# Patient Record
Sex: Male | Born: 1950
Health system: Southern US, Community
[De-identification: ages and names within clinical notes are randomized; demographics above are authoritative.]

## PROBLEM LIST (undated history)

## (undated) DIAGNOSIS — R011 Cardiac murmur, unspecified: Secondary | ICD-10-CM

## (undated) DIAGNOSIS — I2510 Atherosclerotic heart disease of native coronary artery without angina pectoris: Secondary | ICD-10-CM

## (undated) DIAGNOSIS — J439 Emphysema, unspecified: Secondary | ICD-10-CM

## (undated) DIAGNOSIS — R0902 Hypoxemia: Secondary | ICD-10-CM

## (undated) DIAGNOSIS — I509 Heart failure, unspecified: Secondary | ICD-10-CM

## (undated) DIAGNOSIS — J449 Chronic obstructive pulmonary disease, unspecified: Secondary | ICD-10-CM

## (undated) DIAGNOSIS — D120 Benign neoplasm of cecum: Secondary | ICD-10-CM

## (undated) DIAGNOSIS — N6310 Unspecified lump in the right breast, unspecified quadrant: Secondary | ICD-10-CM

## (undated) DIAGNOSIS — M545 Low back pain, unspecified: Secondary | ICD-10-CM

## (undated) DIAGNOSIS — T7840XA Allergy, unspecified, initial encounter: Secondary | ICD-10-CM

## (undated) DIAGNOSIS — Z86010 Personal history of colonic polyps: Secondary | ICD-10-CM

## (undated) DIAGNOSIS — R9439 Abnormal result of other cardiovascular function study: Secondary | ICD-10-CM

## (undated) DIAGNOSIS — L259 Unspecified contact dermatitis, unspecified cause: Secondary | ICD-10-CM

## (undated) DIAGNOSIS — I10 Essential (primary) hypertension: Secondary | ICD-10-CM

## (undated) DIAGNOSIS — E785 Hyperlipidemia, unspecified: Secondary | ICD-10-CM

## (undated) DIAGNOSIS — E119 Type 2 diabetes mellitus without complications: Secondary | ICD-10-CM

## (undated) HISTORY — DX: Essential (primary) hypertension: I10

## (undated) HISTORY — DX: Hyperlipidemia, unspecified: E78.5

## (undated) HISTORY — DX: Hypoxemia: R09.02

## (undated) HISTORY — DX: Allergy, unspecified, initial encounter: T78.40XA

## (undated) HISTORY — PX: WISDOM TOOTH EXTRACTION: SHX21

## (undated) HISTORY — DX: Low back pain: M54.5

## (undated) HISTORY — DX: Abnormal result of other cardiovascular function study: R94.39

## (undated) HISTORY — DX: Emphysema, unspecified: J43.9

## (undated) HISTORY — DX: Unspecified contact dermatitis, unspecified cause: L25.9

## (undated) HISTORY — DX: Low back pain, unspecified: M54.50

## (undated) HISTORY — DX: Benign neoplasm of cecum: D12.0

## (undated) HISTORY — DX: Personal history of colonic polyps: Z86.010

## (undated) HISTORY — DX: Unspecified lump in the right breast, unspecified quadrant: N63.10

## (undated) HISTORY — PX: COLONOSCOPY W/ POLYPECTOMY: SHX1380

## (undated) HISTORY — PX: CARDIAC VALVE REPLACEMENT: SHX585

## (undated) HISTORY — PX: TONSILLECTOMY: SUR1361

---

## 2004-08-26 DIAGNOSIS — R9439 Abnormal result of other cardiovascular function study: Secondary | ICD-10-CM

## 2004-08-26 HISTORY — DX: Abnormal result of other cardiovascular function study: R94.39

## 2005-04-25 ENCOUNTER — Inpatient Hospital Stay (HOSPITAL_COMMUNITY): Admission: EM | Admit: 2005-04-25 | Discharge: 2005-04-28 | Payer: Self-pay | Admitting: Emergency Medicine

## 2005-04-25 ENCOUNTER — Ambulatory Visit: Payer: Self-pay | Admitting: Internal Medicine

## 2005-04-26 ENCOUNTER — Ambulatory Visit: Payer: Self-pay | Admitting: Internal Medicine

## 2005-05-01 ENCOUNTER — Ambulatory Visit: Payer: Self-pay | Admitting: Internal Medicine

## 2005-05-15 ENCOUNTER — Ambulatory Visit: Payer: Self-pay | Admitting: Internal Medicine

## 2005-05-17 ENCOUNTER — Ambulatory Visit: Payer: Self-pay | Admitting: Internal Medicine

## 2005-05-21 ENCOUNTER — Ambulatory Visit: Payer: Self-pay

## 2005-05-21 ENCOUNTER — Encounter: Payer: Self-pay | Admitting: Internal Medicine

## 2005-05-21 ENCOUNTER — Encounter: Admission: RE | Admit: 2005-05-21 | Discharge: 2005-08-19 | Payer: Self-pay | Admitting: Internal Medicine

## 2005-06-04 ENCOUNTER — Ambulatory Visit: Payer: Self-pay | Admitting: Internal Medicine

## 2005-07-12 ENCOUNTER — Ambulatory Visit: Payer: Self-pay | Admitting: Internal Medicine

## 2005-07-15 ENCOUNTER — Ambulatory Visit: Payer: Self-pay | Admitting: Internal Medicine

## 2005-07-22 ENCOUNTER — Ambulatory Visit: Payer: Self-pay | Admitting: Internal Medicine

## 2005-11-05 ENCOUNTER — Ambulatory Visit: Payer: Self-pay | Admitting: Internal Medicine

## 2005-11-05 ENCOUNTER — Ambulatory Visit (HOSPITAL_COMMUNITY): Admission: RE | Admit: 2005-11-05 | Discharge: 2005-11-05 | Payer: Self-pay | Admitting: Internal Medicine

## 2005-11-13 ENCOUNTER — Ambulatory Visit: Payer: Self-pay | Admitting: Gastroenterology

## 2005-12-06 ENCOUNTER — Encounter (INDEPENDENT_AMBULATORY_CARE_PROVIDER_SITE_OTHER): Payer: Self-pay | Admitting: Specialist

## 2005-12-06 ENCOUNTER — Encounter: Payer: Self-pay | Admitting: Internal Medicine

## 2005-12-06 ENCOUNTER — Ambulatory Visit: Payer: Self-pay | Admitting: Gastroenterology

## 2005-12-16 ENCOUNTER — Ambulatory Visit: Payer: Self-pay | Admitting: Internal Medicine

## 2006-03-17 ENCOUNTER — Ambulatory Visit: Payer: Self-pay | Admitting: Internal Medicine

## 2006-03-21 ENCOUNTER — Ambulatory Visit: Payer: Self-pay | Admitting: Internal Medicine

## 2006-04-17 ENCOUNTER — Ambulatory Visit: Payer: Self-pay | Admitting: Internal Medicine

## 2006-05-09 ENCOUNTER — Ambulatory Visit: Payer: Self-pay | Admitting: Internal Medicine

## 2006-08-29 ENCOUNTER — Ambulatory Visit: Payer: Self-pay | Admitting: Internal Medicine

## 2006-10-06 ENCOUNTER — Ambulatory Visit: Payer: Self-pay | Admitting: Internal Medicine

## 2006-10-20 ENCOUNTER — Ambulatory Visit: Payer: Self-pay | Admitting: Internal Medicine

## 2007-01-13 ENCOUNTER — Ambulatory Visit: Payer: Self-pay | Admitting: Internal Medicine

## 2007-03-18 DIAGNOSIS — M1990 Unspecified osteoarthritis, unspecified site: Secondary | ICD-10-CM

## 2007-03-18 DIAGNOSIS — F17200 Nicotine dependence, unspecified, uncomplicated: Secondary | ICD-10-CM

## 2007-03-18 DIAGNOSIS — K648 Other hemorrhoids: Secondary | ICD-10-CM

## 2007-03-18 DIAGNOSIS — Z860100 Personal history of colon polyps, unspecified: Secondary | ICD-10-CM

## 2007-03-18 HISTORY — DX: Personal history of colon polyps, unspecified: Z86.0100

## 2007-03-18 HISTORY — DX: Unspecified osteoarthritis, unspecified site: M19.90

## 2007-03-18 HISTORY — DX: Other hemorrhoids: K64.8

## 2007-03-18 HISTORY — DX: Personal history of colonic polyps: Z86.010

## 2007-10-07 ENCOUNTER — Ambulatory Visit: Payer: Self-pay | Admitting: Internal Medicine

## 2007-10-07 LAB — CONVERTED CEMR LAB
ALT: 31 units/L (ref 0–53)
Albumin: 3.7 g/dL (ref 3.5–5.2)
BUN: 9 mg/dL (ref 6–23)
Basophils Absolute: 0 10*3/uL (ref 0.0–0.1)
Calcium: 9.1 mg/dL (ref 8.4–10.5)
Cholesterol: 243 mg/dL (ref 0–200)
Creatinine, Ser: 0.9 mg/dL (ref 0.4–1.5)
Direct LDL: 177.6 mg/dL
Eosinophils Absolute: 0.1 10*3/uL (ref 0.0–0.6)
Hemoglobin, Urine: NEGATIVE
Leukocytes, UA: NEGATIVE
Lymphocytes Relative: 43.6 % (ref 12.0–46.0)
MCV: 75.6 fL — ABNORMAL LOW (ref 78.0–100.0)
Monocytes Absolute: 0.5 10*3/uL (ref 0.2–0.7)
Monocytes Relative: 10 % (ref 3.0–11.0)
Nitrite: NEGATIVE
PSA: 0.24 ng/mL (ref 0.10–4.00)
Potassium: 4.2 meq/L (ref 3.5–5.1)
RBC: 5.86 M/uL — ABNORMAL HIGH (ref 4.22–5.81)
RDW: 13.6 % (ref 11.5–14.6)
Sodium: 140 meq/L (ref 135–145)
Specific Gravity, Urine: >=1.03 (ref 1.000–1.03)
Total Bilirubin: 0.8 mg/dL (ref 0.3–1.2)
Urine Glucose: NEGATIVE mg/dL
Urobilinogen, UA: 0.2 (ref 0.0–1.0)

## 2007-10-21 ENCOUNTER — Ambulatory Visit: Payer: Self-pay | Admitting: Internal Medicine

## 2007-10-21 DIAGNOSIS — E785 Hyperlipidemia, unspecified: Secondary | ICD-10-CM

## 2007-10-21 DIAGNOSIS — I10 Essential (primary) hypertension: Secondary | ICD-10-CM | POA: Insufficient documentation

## 2007-10-21 HISTORY — DX: Essential (primary) hypertension: I10

## 2007-10-21 HISTORY — DX: Hyperlipidemia, unspecified: E78.5

## 2008-01-25 ENCOUNTER — Encounter: Payer: Self-pay | Admitting: Internal Medicine

## 2008-03-26 DIAGNOSIS — N6310 Unspecified lump in the right breast, unspecified quadrant: Secondary | ICD-10-CM

## 2008-03-26 HISTORY — DX: Unspecified lump in the right breast, unspecified quadrant: N63.10

## 2008-04-20 ENCOUNTER — Ambulatory Visit: Payer: Self-pay | Admitting: Internal Medicine

## 2008-04-20 DIAGNOSIS — D179 Benign lipomatous neoplasm, unspecified: Secondary | ICD-10-CM | POA: Insufficient documentation

## 2008-04-20 DIAGNOSIS — N63 Unspecified lump in unspecified breast: Secondary | ICD-10-CM

## 2008-04-26 ENCOUNTER — Encounter: Admission: RE | Admit: 2008-04-26 | Discharge: 2008-04-26 | Payer: Self-pay | Admitting: Internal Medicine

## 2008-05-09 ENCOUNTER — Ambulatory Visit: Payer: Self-pay | Admitting: Internal Medicine

## 2008-05-09 LAB — CONVERTED CEMR LAB
ALT: 32 units/L (ref 0–53)
AST: 22 units/L (ref 0–37)
BUN: 12 mg/dL (ref 6–23)
Calcium: 9.1 mg/dL (ref 8.4–10.5)
Cholesterol: 173 mg/dL (ref 0–200)
GFR calc non Af Amer: 106 mL/min
LDL Cholesterol: 123 mg/dL — ABNORMAL HIGH (ref 0–99)
Potassium: 5 meq/L (ref 3.5–5.1)
Sodium: 143 meq/L (ref 135–145)
TSH: 0.82 microintl units/mL (ref 0.35–5.50)

## 2008-05-20 ENCOUNTER — Ambulatory Visit: Payer: Self-pay | Admitting: Internal Medicine

## 2008-05-20 DIAGNOSIS — E1165 Type 2 diabetes mellitus with hyperglycemia: Secondary | ICD-10-CM

## 2008-05-20 DIAGNOSIS — E1140 Type 2 diabetes mellitus with diabetic neuropathy, unspecified: Secondary | ICD-10-CM

## 2008-05-20 DIAGNOSIS — IMO0002 Reserved for concepts with insufficient information to code with codable children: Secondary | ICD-10-CM

## 2008-05-20 HISTORY — DX: Reserved for concepts with insufficient information to code with codable children: IMO0002

## 2008-05-20 HISTORY — DX: Type 2 diabetes mellitus with diabetic neuropathy, unspecified: E11.40

## 2008-06-17 ENCOUNTER — Ambulatory Visit: Payer: Self-pay | Admitting: Internal Medicine

## 2008-07-26 HISTORY — PX: LIPOMA EXCISION: SHX5283

## 2008-08-11 ENCOUNTER — Encounter: Payer: Self-pay | Admitting: Cardiovascular Disease

## 2008-08-11 ENCOUNTER — Ambulatory Visit: Payer: Self-pay | Admitting: Cardiovascular Disease

## 2008-08-11 DIAGNOSIS — R9439 Abnormal result of other cardiovascular function study: Secondary | ICD-10-CM

## 2008-08-11 HISTORY — DX: Abnormal result of other cardiovascular function study: R94.39

## 2008-08-18 ENCOUNTER — Encounter: Payer: Self-pay | Admitting: Cardiovascular Disease

## 2008-08-18 ENCOUNTER — Ambulatory Visit: Payer: Self-pay

## 2008-08-22 ENCOUNTER — Ambulatory Visit: Payer: Self-pay | Admitting: Cardiovascular Disease

## 2008-09-13 ENCOUNTER — Encounter: Payer: Self-pay | Admitting: Internal Medicine

## 2009-02-07 ENCOUNTER — Telehealth: Payer: Self-pay | Admitting: Internal Medicine

## 2009-02-10 ENCOUNTER — Ambulatory Visit: Payer: Self-pay | Admitting: Internal Medicine

## 2009-02-10 DIAGNOSIS — N529 Male erectile dysfunction, unspecified: Secondary | ICD-10-CM | POA: Insufficient documentation

## 2009-02-10 HISTORY — DX: Male erectile dysfunction, unspecified: N52.9

## 2009-03-03 ENCOUNTER — Ambulatory Visit: Payer: Self-pay | Admitting: Internal Medicine

## 2009-03-03 LAB — CONVERTED CEMR LAB
ALT: 30 units/L (ref 0–53)
Albumin: 3.9 g/dL (ref 3.5–5.2)
BUN: 11 mg/dL (ref 6–23)
Bilirubin, Direct: 0.1 mg/dL (ref 0.0–0.3)
Cholesterol: 137 mg/dL (ref 0–200)
Creatinine, Ser: 0.8 mg/dL (ref 0.4–1.5)
Glucose, Bld: 149 mg/dL — ABNORMAL HIGH (ref 70–99)
Microalb Creat Ratio: 3.8 mg/g (ref 0.0–30.0)
Microalb, Ur: 1.1 mg/dL (ref 0.0–1.9)
Sodium: 140 meq/L (ref 135–145)
TSH: 0.6 microintl units/mL (ref 0.35–5.50)
Testosterone: 198.22 ng/dL — ABNORMAL LOW (ref 350.00–890.00)

## 2009-03-14 ENCOUNTER — Telehealth: Payer: Self-pay | Admitting: Family Medicine

## 2009-03-23 ENCOUNTER — Ambulatory Visit: Payer: Self-pay | Admitting: Internal Medicine

## 2009-03-23 DIAGNOSIS — R109 Unspecified abdominal pain: Secondary | ICD-10-CM

## 2009-03-29 ENCOUNTER — Telehealth: Payer: Self-pay | Admitting: Internal Medicine

## 2009-03-29 ENCOUNTER — Ambulatory Visit: Payer: Self-pay | Admitting: Interventional Radiology

## 2009-03-29 ENCOUNTER — Ambulatory Visit (HOSPITAL_BASED_OUTPATIENT_CLINIC_OR_DEPARTMENT_OTHER): Admission: RE | Admit: 2009-03-29 | Discharge: 2009-03-29 | Payer: Self-pay | Admitting: Internal Medicine

## 2009-04-19 LAB — CONVERTED CEMR LAB
Calcium: 9.5 mg/dL (ref 8.4–10.5)
Creatinine, Ser: 0.89 mg/dL (ref 0.40–1.50)
WBC, UA: NONE SEEN cells/hpf (ref <3)

## 2009-05-15 ENCOUNTER — Ambulatory Visit: Payer: Self-pay | Admitting: Internal Medicine

## 2009-07-12 ENCOUNTER — Ambulatory Visit: Payer: Self-pay | Admitting: Internal Medicine

## 2009-07-12 LAB — CONVERTED CEMR LAB
CO2: 23 meq/L (ref 19–32)
Potassium: 4.7 meq/L (ref 3.5–5.3)
Sodium: 140 meq/L (ref 135–145)

## 2009-07-12 LAB — HM DIABETES FOOT EXAM

## 2009-07-13 ENCOUNTER — Encounter: Payer: Self-pay | Admitting: Internal Medicine

## 2009-07-28 ENCOUNTER — Telehealth: Payer: Self-pay | Admitting: Internal Medicine

## 2009-08-07 ENCOUNTER — Telehealth: Payer: Self-pay | Admitting: Internal Medicine

## 2009-10-12 ENCOUNTER — Ambulatory Visit: Payer: Self-pay | Admitting: Internal Medicine

## 2009-12-04 ENCOUNTER — Encounter: Payer: Self-pay | Admitting: Internal Medicine

## 2009-12-04 LAB — CONVERTED CEMR LAB
BUN: 10 mg/dL (ref 6–23)
Chloride: 104 meq/L (ref 96–112)
Glucose, Bld: 235 mg/dL — ABNORMAL HIGH (ref 70–99)
Hgb A1c MFr Bld: 10.4 % — ABNORMAL HIGH (ref 4.6–6.1)
Sodium: 139 meq/L (ref 135–145)

## 2009-12-11 ENCOUNTER — Ambulatory Visit: Payer: Self-pay | Admitting: Internal Medicine

## 2009-12-22 ENCOUNTER — Telehealth: Payer: Self-pay | Admitting: Internal Medicine

## 2009-12-28 ENCOUNTER — Telehealth: Payer: Self-pay | Admitting: Internal Medicine

## 2010-01-18 ENCOUNTER — Ambulatory Visit: Payer: Self-pay | Admitting: Internal Medicine

## 2010-01-31 ENCOUNTER — Encounter: Payer: Self-pay | Admitting: Internal Medicine

## 2010-03-01 ENCOUNTER — Telehealth: Payer: Self-pay | Admitting: Internal Medicine

## 2010-03-23 ENCOUNTER — Telehealth: Payer: Self-pay | Admitting: Internal Medicine

## 2010-03-29 ENCOUNTER — Encounter: Payer: Self-pay | Admitting: Internal Medicine

## 2010-05-02 ENCOUNTER — Ambulatory Visit: Payer: Self-pay | Admitting: Internal Medicine

## 2010-05-02 LAB — CONVERTED CEMR LAB
BUN: 13 mg/dL (ref 6–23)
Chloride: 103 meq/L (ref 96–112)
Glucose, Bld: 165 mg/dL — ABNORMAL HIGH (ref 70–99)
Hgb A1c MFr Bld: 8.5 % — ABNORMAL HIGH (ref <5.7)
Potassium: 4.8 meq/L (ref 3.5–5.3)

## 2010-08-02 ENCOUNTER — Ambulatory Visit: Payer: Self-pay | Admitting: Internal Medicine

## 2010-08-02 ENCOUNTER — Encounter: Payer: Self-pay | Admitting: Internal Medicine

## 2010-08-02 DIAGNOSIS — R002 Palpitations: Secondary | ICD-10-CM

## 2010-08-02 LAB — CONVERTED CEMR LAB
ALT: 20 U/L
AST: 17 U/L
Albumin: 4.5 g/dL
Alkaline Phosphatase: 56 U/L
BUN: 17 mg/dL
Bilirubin, Direct: 0.1 mg/dL
CO2: 28 meq/L
Calcium: 9.7 mg/dL
Chloride: 104 meq/L
Cholesterol: 215 mg/dL — ABNORMAL HIGH
Creatinine, Ser: 0.83 mg/dL
Creatinine, Urine: 155.3 mg/dL
Glucose, Bld: 107 mg/dL — ABNORMAL HIGH
HDL: 46 mg/dL
Hgb A1c MFr Bld: 8.9 % — ABNORMAL HIGH
Indirect Bilirubin: 0.2 mg/dL
LDL Cholesterol: 144 mg/dL — ABNORMAL HIGH
Microalb Creat Ratio: 4.2 mg/g
Microalb, Ur: 0.65 mg/dL
Potassium: 4.7 meq/L
Sodium: 140 meq/L
Total Bilirubin: 0.3 mg/dL
Total CHOL/HDL Ratio: 4.7
Total Protein: 7.2 g/dL
Triglycerides: 127 mg/dL
VLDL: 25 mg/dL

## 2010-08-03 ENCOUNTER — Encounter: Payer: Self-pay | Admitting: Internal Medicine

## 2010-08-08 ENCOUNTER — Telehealth: Payer: Self-pay | Admitting: Internal Medicine

## 2010-08-28 ENCOUNTER — Encounter: Payer: Self-pay | Admitting: Internal Medicine

## 2010-09-17 ENCOUNTER — Encounter: Payer: Self-pay | Admitting: Internal Medicine

## 2010-09-25 NOTE — Letter (Signed)
   [place] [facility] [address] [place], [place]  [address]  [place] Phone: [phone]      May 02, 2010   [name] [address] [place], [place] [address]  RE:  LAB RESULTS  Dear  [name],  The following is an interpretation of your most recent lab tests.  Please take note of any instructions provided or changes to medications that have resulted from your lab work.  ELECTROLYTES:  Good - no changes needed  KIDNEY FUNCTION TESTS:  Good - no changes needed    DIABETIC STUDIES:  Improved - continue management Blood Glucose: 165   HgbA1C: 8.5   Microalbumin/Creatinine Ratio: 3.8          Sincerely Yours,    Dr. Thomos Lemons  Appended Document:  mailed

## 2010-09-25 NOTE — Letter (Signed)
Summary: Maida Sale   Imported By: Lanelle Bal 02/06/2010 11:27:30  _____________________________________________________________________  External Attachment:    Type:   Image     Comment:   External Document

## 2010-09-25 NOTE — Assessment & Plan Note (Signed)
Summary: 1 month follow up/mhf   Vital Signs:  Patient profile:   60 year old male Height:      76 inches Weight:      198 pounds BMI:     24.19 O2 Sat:      97 % on Room air Temp:     98.1 degrees F oral Pulse rate:   81 / minute Pulse rhythm:   regular Resp:     16 per minute BP sitting:   148 / 80  (right arm) Cuff size:   regular  Vitals Entered By: Glendell Docker CMA (Jan 18, 2010 8:07 AM)  O2 Flow:  Room air CC: Rm 2- 1 Month Follow up, Type 2 diabetes mellitus follow-up Is Patient Diabetic? Yes Did you bring your meter with you today? No  Does patient need assistance? Functional Status Cook/clean   Primary Care Provider:  DThomos Lemons DO  CC:  Rm 2- 1 Month Follow up and Type 2 diabetes mellitus follow-up.  History of Present Illness:  Type 2 Diabetes Mellitus Follow-Up      This is a 60 year old man who presents for Type 2 diabetes mellitus follow-up.  The patient denies self managed hypoglycemia, hypoglycemia requiring help, and weight gain.  The patient denies the following symptoms: chest pain.  Since the last visit the patient reports compliance with medications and monitoring blood glucose.  low blood sugar 97 high 177 avg 130's   Allergies: 1)  ! Hydrochlorothiazide (Hydrochlorothiazide) 2)  Norvasc  Past History:  Past Medical History: Colonic polyps, hx of Diabetes mellitus, type II Hypertension  Hyperlipidemia    Low back pain    Right breat mass - 03/2008   Lipoma  Abnormal Myoview:  2006 EF 44% ? inferoseptal ischemia.  no cath done.  Past Surgical History: Lipoma surgery - Dr.Tseui 07/2008       Family History: Father deceased at age 68 secondary to melanoma.   Mother  alive at age 70 with hypertension .       Social History: Occupation: Music therapist Married Former Smoker -  33 pack year history Alcohol use-no   Drug use-no         Physical Exam  General:  alert, well-developed, and well-nourished.   Lungs:  normal respiratory  effort and normal breath sounds.   Heart:  normal rate, regular rhythm, and no gallop.   Extremities:  No lower extremity edema   Impression & Recommendations:  Problem # 1:  DIABETES MELLITUS, TYPE II, UNCONTROLLED (ICD-250.02) Assessment Improved  Pt mainly using apidra with evening meal.  he noticed hypoglycemia with 20-25 units.  he usually uses 15 units. pt encouraged to also use apidra at lunch.  extra sample for apidra pen provided  His updated medication list for this problem includes:    Lantus Solostar 100 Unit/ml Soln (Insulin glargine) .Marland KitchenMarland KitchenMarland KitchenMarland Kitchen 45-55  units subcutaneously once daily    Low-dose Aspirin 81 Mg Tabs (Aspirin) ..... One by mouth once daily    Lisinopril 40 Mg Tabs (Lisinopril) ..... One by mouth once daily    Glucophage Xr 500 Mg Tb24 (Metformin hcl) .Marland Kitchen... 2 tablets by mouth two times a day    Apidra Solostar 100 Unit/ml Soln (Insulin glulisine) .Marland KitchenMarland KitchenMarland KitchenMarland Kitchen 10-20 units subcutaneously before meals or snacks as directed three times a day  Labs Reviewed: Creat: 0.92 (12/04/2009)    Reviewed HgBA1c results: 10.4 (12/04/2009)  8.5 (07/12/2009)  Future Orders: T- Hemoglobin A1C ([id number]) ... 04/16/2010  Problem # 2:  HYPERTENSION (ICD-401.9)  suboptimal control.  increase nifedipine dose  His updated medication list for this problem includes:    Lisinopril 40 Mg Tabs (Lisinopril) ..... One by mouth once daily    Nifedipine Cr Osmotic 90 Mg Xr24h-tab (Nifedipine) ..... One by mouth once daily  BP today: 148/80 Prior BP: 140/80 (12/11/2009)  Labs Reviewed: K+: 4.9 (12/04/2009) Creat: : 0.92 (12/04/2009)   Chol: 137 (03/03/2009)   HDL: 40.80 (03/03/2009)   LDL: 79 (03/03/2009)   TG: 84.0 (03/03/2009)  Future Orders: T-Basic Metabolic Panel [id number]) ... 04/16/2010  Problem # 3:  ERECTILE DYSFUNCTION, ORGANIC (ICD-607.84) Cialis stopped due to nausea  Complete Medication List: 1)  Lantus Solostar 100 Unit/ml Soln (Insulin glargine) .... 45-55  units  subcutaneously once daily 2)  Low-dose Aspirin 81 Mg Tabs (Aspirin) .... One by mouth once daily 3)  Lisinopril 40 Mg Tabs (Lisinopril) .... One by mouth once daily 4)  Glucophage Xr 500 Mg Tb24 (Metformin hcl) .... 2 tablets by mouth two times a day 5)  Crestor 20 Mg Tabs (Rosuvastatin calcium) .... One by mouth qd 6)  Bd U/f Short Pen Needle 31g X 8 Mm Misc (Insulin pen needle) .... For two times a day injection 7)  Nifedipine Cr Osmotic 90 Mg Xr24h-tab (Nifedipine) .... One by mouth once daily 8)  Centrum Tabs (Multiple vitamins-minerals) .... Take 1 tablet by mouth once a day 9)  Apidra Solostar 100 Unit/ml Soln (Insulin glulisine) .Marland Kitchen.. 10-20 units subcutaneously before meals or snacks as directed three times a day  Patient Instructions: 1)  Please schedule a follow-up appointment in 3 months. 2)  BMP prior to visit, ICD-9:  401.9 3)  HbgA1C prior to visit, ICD-9:  250.02 4)  Please return for lab work one (1) week before your next appointment.  Prescriptions: NIFEDIPINE CR OSMOTIC 90 MG XR24H-TAB (NIFEDIPINE) one by mouth once daily  #30 x 3   Entered and Authorized by:   D. Thomos Lemons DO   Signed by:   D. Thomos Lemons DO on 01/18/2010   Method used:   Electronically to        [facility] [address] (retail)       [address]       [place], [place]  [address]       Ph: [phone]       Fax: [phone]   RxID:   [id number]   Current Allergies (reviewed today): ! HYDROCHLOROTHIAZIDE (HYDROCHLOROTHIAZIDE) NORVASC

## 2010-09-25 NOTE — Progress Notes (Signed)
Summary: Metformin Refill  Phone Note Call from Patient Call back at [phone]   Caller: Spouse-Kathy Call For: D. Thomos Lemons DO Reason for Call: Refill Medication Summary of Call: patients wife called and left voice message requesting a refill on patients Metformin, she states the current rx is expired and he will need a new one sent to the pharmacy.  Initial call taken by: Glendell Docker CMA,  March 01, 2010 10:32 AM  Follow-up for Phone Call        call returned to patient  wife, who was not available, message was left informing her rx sent to pharmacy. If additional questions she was advised to call back. Follow-up by: Glendell Docker CMA,  March 01, 2010 10:35 AM    New/Updated Medications: GLUCOPHAGE XR 500 MG  TB24 (METFORMIN HCL) 2 tablets by mouth two times a day Prescriptions: GLUCOPHAGE XR 500 MG  TB24 (METFORMIN HCL) 2 tablets by mouth two times a day  #120 x 5   Entered by:   Glendell Docker CMA   Authorized by:   D. Thomos Lemons DO   Signed by:   Glendell Docker CMA on 03/01/2010   Method used:   Electronically to        [facility] [address] (retail)       [address]       [place], [place]  [address]       Ph: [phone]       Fax: [phone]   RxID:   [id number]

## 2010-09-25 NOTE — Assessment & Plan Note (Signed)
Summary: 3 month follow up/mhf   Vital Signs:  Patient profile:   60 year old male Weight:      200.25 pounds BMI:     24.46 O2 Sat:      97 % on Room air Temp:     98.1 degrees F oral Pulse rate:   82 / minute Pulse rhythm:   regular Resp:     16 per minute BP sitting:   122 / 80  (right arm) Cuff size:   large  Vitals Entered By: Glendell Docker CMA (October 12, 2009 8:00 AM)  O2 Flow:  Room air  Primary Care Provider:  D. Thomos Lemons DO  CC:  3 Month Follow up and Type 2 diabetes mellitus follow-up.  History of Present Illness: 3 Month Follow up disease management   Type 2 Diabetes Mellitus Follow-Up      This is a 60 year old man who presents for Type 2 diabetes mellitus follow-up.  The patient reports self managed hypoglycemia, but denies hypoglycemia requiring help.  The patient denies the following symptoms: chest pain.  Since the last visit the patient reports poor dietary compliance and monitoring blood glucose.  he has been taking apidra an hour before evening meal.  Allergies: 1)  ! Hydrochlorothiazide (Hydrochlorothiazide) 2)  Norvasc  Past History:  Past Medical History: Colonic polyps, hx of Diabetes mellitus, type II Hypertension  Hyperlipidemia  Low back pain    Right breat mass - 03/2008   Lipoma  Abnormal Myoview:  2006 EF 44% ? inferoseptal ischemia.  no cath done.  Past Surgical History: Lipoma surgery - Dr.Tseui 07/2008     Family History: Father deceased at age 79 secondary to melanoma.   Mother  alive at age 13 with hypertension.      Social History: Occupation: Music therapist Married Former Smoker -  33 pack year history Alcohol use-no  Drug use-no        Physical Exam  General:  alert, well-developed, and well-nourished.   Lungs:  normal respiratory effort and normal breath sounds.   Heart:  normal rate, regular rhythm, and no gallop.   Extremities:  No lower extremity edema    Impression & Recommendations:  Problem # 1:   DIABETES MELLITUS, TYPE II, UNCONTROLLED (ICD-250.02) Pt with poor insight re:  short acting insulin use.   He has been using 1 hr before meals.  I suggest referral to diabetic educator for carb counting and insulin education.  Pt advised to take apidra right before meal. Pt will call office if he experiences hypoglycemia.  His updated medication list for this problem includes:    Lantus Solostar 100 Unit/ml Soln (Insulin glargine) .Marland KitchenMarland KitchenMarland KitchenMarland Kitchen 40 units subcutaneously    Low-dose Aspirin 81 Mg Tabs (Aspirin) ..... One by mouth once daily    Lisinopril 40 Mg Tabs (Lisinopril) ..... One by mouth once daily    Glucophage Xr 500 Mg Tb24 (Metformin hcl) .Marland Kitchen... 2 tablets by mouth two times a day    Apidra Solostar 100 Unit/ml Soln (Insulin glulisine) .Marland KitchenMarland KitchenMarland KitchenMarland Kitchen 10 - 20 units 10 -15 minutes before dinner  Problem # 2:  HYPERTENSION (ICD-401.9) Assessment: Unchanged stable.  Maintain current medication regimen.  His updated medication list for this problem includes:    Lisinopril 40 Mg Tabs (Lisinopril) ..... One by mouth once daily    Nifedipine Cr Osmotic 60 Mg Xr24h-tab (Nifedipine) ..... One by mouth qd  BP today: 122/80 Prior BP: 114/90 (07/12/2009)  Labs Reviewed: K+: 4.7 (07/12/2009) Creat: :  0.95 (07/12/2009)   Chol: 137 (03/03/2009)   HDL: 40.80 (03/03/2009)   LDL: 79 (03/03/2009)   TG: 84.0 (03/03/2009)  Complete Medication List: 1)  Lantus Solostar 100 Unit/ml Soln (Insulin glargine) .... 40 units subcutaneously 2)  Low-dose Aspirin 81 Mg Tabs (Aspirin) .... One by mouth once daily 3)  Lisinopril 40 Mg Tabs (Lisinopril) .... One by mouth once daily 4)  Glucophage Xr 500 Mg Tb24 (Metformin hcl) .... 2 tablets by mouth two times a day 5)  Crestor 20 Mg Tabs (Rosuvastatin calcium) .... One by mouth qd 6)  Bd U/f Short Pen Needle 31g X 8 Mm Misc (Insulin pen needle) .... For two times a day injection 7)  Nifedipine Cr Osmotic 60 Mg Xr24h-tab (Nifedipine) .... One by mouth qd 8)  Centrum Tabs  (Multiple vitamins-minerals) .... Take 1 tablet by mouth once a day 9)  Apidra Solostar 100 Unit/ml Soln (Insulin glulisine) .Marland Kitchen.. 10 - 20 units 10 -15 minutes before dinner  Patient Instructions: 1)  Please schedule a follow-up appointment in 2 months. 2)  BMP prior to visit, ICD-9:  401.9 3)  HbgA1C prior to visit, ICD-9: 250.02 4)  Please return for lab work one (1) week before your next appointment.  Prescriptions: APIDRA SOLOSTAR 100 UNIT/ML SOLN (INSULIN GLULISINE) 10 - 20 units 10 -15 minutes before dinner  #1 month x 2   Entered and Authorized by:   D. Thomos Lemons DO   Signed by:   D. Thomos Lemons DO on 10/12/2009   Method used:   Electronically to        [facility] [address] (retail)       [address]       [place], [place]  [address]       Ph: [phone]       Fax: [phone]   RxID:   [id number]   Current Allergies (reviewed today): ! HYDROCHLOROTHIAZIDE (HYDROCHLOROTHIAZIDE) NORVASC

## 2010-09-25 NOTE — Progress Notes (Signed)
  Phone Note Outgoing Call   Summary of Call: call pt - if greater than one year for DM eye exam.  we need to schedule Initial call taken by: D. Thomos Lemons DO,  Dec 28, 2009 5:56 PM  Follow-up for Phone Call        Left message on machine for return call    Appt  Dr  Emily Filbert   May 26th  @ 9:30  Darral Dash  Jan 09, 2010 9:40 AM  Follow-up by: Darral Dash,  Jan 02, 2010 2:25 PM

## 2010-09-25 NOTE — Progress Notes (Signed)
Summary: Metformin refill  Phone Note Refill Request Message from:  Patient spouse on December 22, 2009 9:24 AM  Refills Requested: Medication #1:  GLUCOPHAGE XR 500 MG  TB24 2 tablets by mouth two times a day Left message on voicemail notifying spouse prescription was refilled.  Next Appointment Scheduled: 01-09-10 Initial call taken by: Lucious Groves,  December 22, 2009 9:24 AM    Prescriptions: GLUCOPHAGE XR 500 MG  TB24 (METFORMIN HCL) 2 tablets by mouth two times a day  #120 x 5   Entered by:   Lucious Groves   Authorized by:   D. Thomos Lemons DO   Signed by:   Lucious Groves on 12/22/2009   Method used:   F[facility]ed to ...       [facility] ([facility])       [address]       [place], [place]  [address]       Ph: [phone]       Fax: [phone]   RxID:   [id number]

## 2010-09-25 NOTE — Assessment & Plan Note (Signed)
Summary: 3 month fu/dt   Vital Signs:  Patient profile:   60 year old male Height:      76 inches Weight:      199.25 pounds BMI:     24.34 O2 Sat:      98 % on Room air Temp:     97.9 degrees F oral Pulse rate:   82 / minute Pulse rhythm:   regular Resp:     16 per minute BP sitting:   144 / 90  (right arm) Cuff size:   large  Vitals Entered By: Glendell Docker CMA (May 02, 2010 9:26 AM)  O2 Flow:  Room air CC: 3 Month follow up, Type 2 diabetes mellitus follow-up Is Patient Diabetic? Yes Did you bring your meter with you today? No Pain Assessment Patient in pain? no      Comments  low blood sugar 87 high 175 avg 130-elevation is usually in evening after meal time   Primary Care Jashawna Reever:  DThomos Lemons DO  CC:  3 Month follow up and Type 2 diabetes mellitus follow-up.  History of Present Illness:  Type 2 Diabetes Mellitus Follow-Up      This is a 60 year old man who presents for Type 2 diabetes mellitus follow-up.  The patient denies self managed hypoglycemia, hypoglycemia requiring help, and weight gain.  The patient denies the following symptoms: chest pain.  Since the last visit the patient reports good dietary compliance, compliance with medications, and monitoring blood glucose.  using apidra 2 x per day.   Preventive Screening-Counseling & Management  Alcohol-Tobacco     Smoking Status: quit  Allergies: 1)  ! Hydrochlorothiazide (Hydrochlorothiazide) 2)  Norvasc  Past History:  Past Medical History: Colonic polyps, hx of Diabetes mellitus, type II Hypertension  Hyperlipidemia    Low back pain    Right breat mass - 03/2008   Lipoma   Abnormal Myoview:  2006 EF 44% ? inferoseptal ischemia.  no cath done.  Past Surgical History: Lipoma surgery - Dr.Tseui 07/2008        Family History: Father deceased at age 42 secondary to melanoma.   Mother  alive at age 70 with hypertension .        Social History: Occupation:  Music therapist Married Former Smoker -  33 pack year history Alcohol use-no   Drug use-no          Physical Exam  General:  alert, well-developed, and well-nourished.   Lungs:  normal respiratory effort and normal breath sounds.   Heart:  normal rate, regular rhythm, and no gallop.   Abdomen:  soft, non-tender, and normal bowel sounds.   Extremities:  No lower extremity edema   Impression & Recommendations:  Problem # 1:  DIABETES MELLITUS, TYPE II, UNCONTROLLED (ICD-250.02) Assessment Improved I urged better dietary compliance.   His updated medication list for this problem includes:    Lantus Solostar 100 Unit/ml Soln (Insulin glargine) .Marland KitchenMarland KitchenMarland KitchenMarland Kitchen 45-55  units subcutaneously once daily    Low-dose Aspirin 81 Mg Tabs (Aspirin) ..... One by mouth once daily    Lisinopril 40 Mg Tabs (Lisinopril) ..... One by mouth once daily    Glucophage Xr 500 Mg Tb24 (Metformin hcl) .Marland Kitchen... 2 tablets by mouth two times a day    Apidra Solostar 100 Unit/ml Soln (Insulin glulisine) .Marland KitchenMarland KitchenMarland KitchenMarland Kitchen 10-20 units subcutaneously before meals or snacks as directed three times a day  Orders: T-Basic Metabolic Panel [id number]) T- Hemoglobin A1C ([id number])  Labs Reviewed: Creat: 0.92 (  12/04/2009)     Last Eye Exam: normal (01/18/2010) Reviewed HgBA1c results: 10.4 (12/04/2009)  8.5 (07/12/2009)  Problem # 2:  HYPERTENSION (ICD-401.9)  His updated medication list for this problem includes:    Lisinopril 40 Mg Tabs (Lisinopril) ..... One by mouth once daily    Nifedipine Cr Osmotic 90 Mg Xr24h-tab (Nifedipine) ..... One by mouth once daily  BP today: 144/90 Prior BP: 148/80 (01/18/2010)  Labs Reviewed: K+: 4.9 (12/04/2009) Creat: : 0.92 (12/04/2009)   Chol: 137 (03/03/2009)   HDL: 40.80 (03/03/2009)   LDL: 79 (03/03/2009)   TG: 84.0 (03/03/2009)  Complete Medication List: 1)  Lantus Solostar 100 Unit/ml Soln (Insulin glargine) .... 45-55  units subcutaneously once daily 2)  Low-dose Aspirin 81 Mg Tabs  (Aspirin) .... One by mouth once daily 3)  Lisinopril 40 Mg Tabs (Lisinopril) .... One by mouth once daily 4)  Glucophage Xr 500 Mg Tb24 (Metformin hcl) .... 2 tablets by mouth two times a day 5)  Crestor 20 Mg Tabs (Rosuvastatin calcium) .... One by mouth qd 6)  Bd U/f Short Pen Needle 31g X 8 Mm Misc (Insulin pen needle) .... For two times a day injection 7)  Nifedipine Cr Osmotic 90 Mg Xr24h-tab (Nifedipine) .... One by mouth once daily 8)  Centrum Tabs (Multiple vitamins-minerals) .... Take 1 tablet by mouth once a day 9)  Apidra Solostar 100 Unit/ml Soln (Insulin glulisine) .Marland Kitchen.. 10-20 units subcutaneously before meals or snacks as directed three times a day  Other Orders: Influenza Vaccine NON MCR ([address]) Flu Vaccine 43yrs + MEDICARE PATIENTS (U0454)  Patient Instructions: 1)  Please schedule a follow-up appointment in 3 months. Prescriptions: APIDRA SOLOSTAR 100 UNIT/ML SOLN (INSULIN GLULISINE) 10-20 units subcutaneously before meals or snacks as directed three times a day  #1 month x 5   Entered and Authorized by:   D. Thomos Lemons DO   Signed by:   D. Thomos Lemons DO on 05/02/2010   Method used:   Electronically to        [facility] (retail)       [address]       [place], [place]  [address]       Ph: [phone]       Fax: [phone]   RxID:   [id number] NIFEDIPINE CR OSMOTIC 90 MG XR24H-TAB (NIFEDIPINE) one by mouth once daily  #30 x 5   Entered and Authorized by:   D. Thomos Lemons DO   Signed by:   D. Thomos Lemons DO on 05/02/2010   Method used:   Electronically to        [facility] (retail)       [address]       [place], [place]  [address]       Ph: [phone]       Fax: [phone]   RxID:   [id number]   Current Allergies (reviewed today): ! HYDROCHLOROTHIAZIDE (HYDROCHLOROTHIAZIDE) NORVASC   Immunizations Administered:  Influenza Vaccine # 1:    Vaccine Type: Fluvax Non-MCR     Site: left deltoid    Mfr: [employer]Smith[employer]    Dose: 0.5 ml    Route: IM    Given by: Glendell Docker CMA    Exp. Date: 02/23/2011    Lot #: [id number]    VIS given: 03/19/07 version given May 02, 2010.  Flu Vaccine Consent Questions:  Do you have a history of severe allergic reactions to this vaccine? no    Any prior history of allergic reactions to egg and/or gelatin? no    Do you have a sensitivity to the preservative Thimersol? no    Do you have a past history of Guillan-Barre Syndrome? no    Do you currently have an acute febrile illness? no    Have you ever had a severe reaction to latex? no    Vaccine information given and explained to patient? yes

## 2010-09-25 NOTE — Letter (Signed)
   [place] at [facility] [address] [place], [place]  [address]  [place] Phone: [phone]      August 03, 2010   [name] [address] [place], [place] [address]  RE:  LAB RESULTS  Dear  [name],  The following is an interpretation of your most recent lab tests.  Please take note of any instructions provided or changes to medications that have resulted from your lab work.  KIDNEY FUNCTION TESTS:  Good - no changes needed  LIPID PANEL:  Fair - review at your next visit Triglyceride: 127   Cholesterol: 215   LDL: 144   HDL: 46   Chol/HDL%:  4.7 Ratio   DIABETIC STUDIES:  Poor - schedule a follow-up appointment soon Blood Glucose: 107   HgbA1C: 8.9   Microalbumin/Creatinine Ratio: 4.2      Please take your cholestorol medication regularly.      Sincerely Yours,    Dr. Thomos Lemons  Appended Document:  mailed

## 2010-09-25 NOTE — Letter (Signed)
Summary: Unable to Contact Patient/[facility]  Unable to Contact Patient/[facility]   Imported By: Lanelle Bal 04/06/2010 12:16:38  _____________________________________________________________________  External Attachment:    Type:   Image     Comment:   External Document

## 2010-09-25 NOTE — Progress Notes (Signed)
Summary: Medciation Refills  Phone Note Call from Patient Call back at Home Phone [phone]   Caller: Spouse- Olegario Messier Reason for Call: Refill Medication Summary of Call: patients wife Olegario Messier called and left voice message stating patient has checked with pharmacy for refills on Lisinopril and Lantus, and asked that he check with the office.  Initial call taken by: Glendell Docker CMA,  March 23, 2010 11:47 AM  Follow-up for Phone Call        call returned to patients wife Olegario Messier at  [phone], she was informed rx refills sent to pharmacy Follow-up by: Glendell Docker CMA,  March 23, 2010 11:51 AM    Prescriptions: LISINOPRIL 40 MG TABS (LISINOPRIL) one by mouth once daily  #30 x 5   Entered by:   Glendell Docker CMA   Authorized by:   D. Thomos Lemons DO   Signed by:   Glendell Docker CMA on 03/23/2010   Method used:   Electronically to        [facility] (retail)       [address]       [place], [place]  [address]       Ph: [phone]       Fax: [phone]   RxID:   [id number] LANTUS SOLOSTAR 100 UNIT/ML  SOLN (INSULIN GLARGINE) 45-55  units subcutaneously once daily  #1 x 3   Entered by:   Glendell Docker CMA   Authorized by:   D. Thomos Lemons DO   Signed by:   Glendell Docker CMA on 03/23/2010   Method used:   Electronically to        [facility] [address] (retail)       [address]       [place], [place]  [address]       Ph: [phone]       Fax: [phone]   RxID:   [id number]

## 2010-09-25 NOTE — Miscellaneous (Signed)
Summary: Orders Update  Clinical Lists Changes  Orders: Added new Test order of TLB-BMP (Basic Metabolic Panel-BMET) (80048-METABOL) - Signed Added new Test order of TLB-A1C / Hgb A1C (Glycohemoglobin) (83036-A1C) - Signed 

## 2010-09-25 NOTE — Assessment & Plan Note (Signed)
Summary: 2 month follow up/mhf   Vital Signs:  Patient profile:   60 year old male Height:      76 inches Weight:      201.50 pounds BMI:     24.62 O2 Sat:      98 % on Room air Temp:     97.7 degrees F oral Pulse rate:   71 / minute Pulse rhythm:   regular Resp:     18 per minute BP sitting:   140 / 80  (right arm) Cuff size:   large  Vitals Entered By: Glendell Docker CMA (December 11, 2009 8:13 AM)  O2 Flow:  Room air  CC: Rm 3- 2 Month Follow up disease management, Type 2 diabetes mellitus follow-up Is Patient Diabetic? Yes Comments low blood suagr 120 high 205 avg 175, elevation is mostly in the morning   Primary Care Provider:  D. Thomos Lemons DO  CC:  Rm 3- 2 Month Follow up disease management and Type 2 diabetes mellitus follow-up.  History of Present Illness:  Type 2 Diabetes Mellitus Follow-Up      This is a 60 year old man who presents for Type 2 diabetes mellitus follow-up.  The patient denies self managed hypoglycemia and hypoglycemia requiring help.  The patient denies the following symptoms: chest pain.  Since the last visit the patient reports poor dietary compliance, not exercising regularly, and monitoring blood glucose.    Allergies: 1)  ! Hydrochlorothiazide (Hydrochlorothiazide) 2)  Norvasc  Past History:  Past Medical History: Colonic polyps, hx of Diabetes mellitus, type II Hypertension  Hyperlipidemia   Low back pain    Right breat mass - 03/2008   Lipoma  Abnormal Myoview:  2006 EF 44% ? inferoseptal ischemia.  no cath done.  Past Surgical History: Lipoma surgery - Dr.Tseui 07/2008      Family History: Father deceased at age 66 secondary to melanoma.   Mother  alive at age 48 with hypertension.       Social History: Occupation: Music therapist Married Former Smoker -  33 pack year history Alcohol use-no  Drug use-no         Physical Exam  General:  alert, well-developed, and well-nourished.   Lungs:  normal respiratory effort and no  accessory muscle use.   Heart:  normal rate, regular rhythm, and no gallop.   Extremities:  No lower extremity edema   Impression & Recommendations:  Problem # 1:  DIABETES MELLITUS, TYPE II, UNCONTROLLED (ICD-250.02) Assessment Deteriorated blood sugar control has worsened.  Increase short acting insulin.  Use 3 times a day or snacks and meals. He previously used only before evening meal.  Encouraged dietary compliance His updated medication list for this problem includes:    Lantus Solostar 100 Unit/ml Soln (Insulin glargine) .Marland KitchenMarland KitchenMarland KitchenMarland Kitchen 45-55  units subcutaneously once daily    Low-dose Aspirin 81 Mg Tabs (Aspirin) ..... One by mouth once daily    Lisinopril 40 Mg Tabs (Lisinopril) ..... One by mouth once daily    Glucophage Xr 500 Mg Tb24 (Metformin hcl) .Marland Kitchen... 2 tablets by mouth two times a day    Apidra Solostar 100 Unit/ml Soln (Insulin glulisine) .Marland KitchenMarland KitchenMarland KitchenMarland Kitchen 10-20 units subcutaneously before meals or snacks as directed three times a day  Orders: Nutrition Referral (Nutrition)  Complete Medication List: 1)  Lantus Solostar 100 Unit/ml Soln (Insulin glargine) .... 45-55  units subcutaneously once daily 2)  Low-dose Aspirin 81 Mg Tabs (Aspirin) .... One by mouth once daily 3)  Lisinopril 40  Mg Tabs (Lisinopril) .... One by mouth once daily 4)  Glucophage Xr 500 Mg Tb24 (Metformin hcl) .... 2 tablets by mouth two times a day 5)  Crestor 20 Mg Tabs (Rosuvastatin calcium) .... One by mouth qd 6)  Bd U/f Short Pen Needle 31g X 8 Mm Misc (Insulin pen needle) .... For two times a day injection 7)  Nifedipine Cr Osmotic 60 Mg Xr24h-tab (Nifedipine) .... One by mouth qd 8)  Centrum Tabs (Multiple vitamins-minerals) .... Take 1 tablet by mouth once a day 9)  Apidra Solostar 100 Unit/ml Soln (Insulin glulisine) .Marland Kitchen.. 10-20 units subcutaneously before meals or snacks as directed three times a day  Patient Instructions: 1)  Please schedule a follow-up appointment in 1 month. Prescriptions: LANTUS  SOLOSTAR 100 UNIT/ML  SOLN (INSULIN GLARGINE) 45-55  units subcutaneously once daily  #1 month x 2   Entered and Authorized by:   D. Thomos Lemons DO   Signed by:   D. Thomos Lemons DO on 12/11/2009   Method used:   Electronically to        [facility] (retail)       [address]       [place], [place]  [address]       Ph: [phone]       Fax: [phone]   RxID:   [id number] APIDRA SOLOSTAR 100 UNIT/ML SOLN (INSULIN GLULISINE) 10-20 units subcutaneously before meals or snacks as directed three times a day  #1 month x 5   Entered and Authorized by:   D. Thomos Lemons DO   Signed by:   D. Thomos Lemons DO on 12/11/2009   Method used:   Electronically to        [facility] (retail)       [address]       [place], [place]  [address]       Ph: [phone]       Fax: [phone]   RxID:   [id number]   Current Allergies (reviewed today): ! HYDROCHLOROTHIAZIDE (HYDROCHLOROTHIAZIDE) NORVASC

## 2010-09-25 NOTE — Miscellaneous (Signed)
Summary: Eye Exam   Clinical Lists Changes  Observations: Added new observation of DMEYEEXAMNXT: 01/2011 (01/31/2010 9:59) Added new observation of DMEYEEXMRES: normal (01/18/2010 10:00) Added new observation of EYE EXAM BY: [facility] (01/18/2010 10:00) Added new observation of DIAB EYE EX: normal (01/18/2010 10:00)       Diabetes Management Exam:    Eye Exam:       Eye Exam done elsewhere          Date: 01/18/2010          Results: normal          Done by: [facility]

## 2010-09-27 NOTE — Letter (Signed)
Summary: Fax Regarding Disease Mgmt Program/[facility]  Fax Regarding Disease Mgmt Program/[facility]   Imported By: Lanelle Bal 09/06/2010 11:26:32  _____________________________________________________________________  External Attachment:    Type:   Image     Comment:   External Document

## 2010-09-27 NOTE — Assessment & Plan Note (Signed)
Summary: 3 MONTH FOLLOW UP/MHF   Vital Signs:  Patient profile:   60 year old male Height:      76 inches Weight:      200.75 pounds BMI:     24.52 O2 Sat:      96 % on Room air Temp:     97.6 degrees F oral Pulse rate:   92 / minute Resp:     20 per minute BP sitting:   110 / 80  (left arm) Cuff size:   large  Vitals Entered By: Glendell Docker CMA (August 02, 2010 9:05 AM)  O2 Flow:  Room air CC: 3 Month follow up Is Patient Diabetic? Yes Pain Assessment Patient in pain? no        Primary Care Provider:  Dondra Spry DO  CC:  3 Month follow up.  History of Present Illness: 60 y/o AA male for f/u   feels flushed and hot when he gets to work take meds in AM he has not checked his blood sugar no chest pain,  no sob  mother in law in hospice  Preventive Screening-Counseling & Management  Alcohol-Tobacco     Smoking Status: quit  Allergies: 1)  ! Hydrochlorothiazide (Hydrochlorothiazide) 2)  Norvasc  Past History:  Past Medical History: Colonic polyps, hx of Diabetes mellitus, type II Hypertension  Hyperlipidemia     Low back pain    Right breat mass - 03/2008   Lipoma   Abnormal Myoview:  2006 EF 44% ? inferoseptal ischemia.  no cath done.  Family History: Father deceased at age 39 secondary to melanoma.   Mother  alive at age 47 with hypertension .         Social History: Occupation: Music therapist Married  Former Smoker -  33 pack year history Alcohol use-no   Drug use-no          Review of Systems       pt reports intermittent fluttering in his chest  Physical Exam  General:  alert, well-developed, and well-nourished.   Neck:  No deformities, masses, or tenderness noted.no carotid bruits.   Lungs:  normal respiratory effort and normal breath sounds.   Heart:  normal rate, regular rhythm, and no gallop.   Extremities:  No lower extremity edema   Impression & Recommendations:  Problem # 1:  PALPITATIONS (ICD-785.1) palpitations  may be due to hypoglycemia pt advised to bring his glucometer to work  Problem # 2:  DIABETES MELLITUS, TYPE II, UNCONTROLLED (ICD-250.02) Assessment: Unchanged pt to monitor for hypoglycemia  His updated medication list for this problem includes:    Lantus Solostar 100 Unit/ml Soln (Insulin glargine) .Marland KitchenMarland KitchenMarland KitchenMarland Kitchen 45-55  units subcutaneously once daily    Low-dose Aspirin 81 Mg Tabs (Aspirin) ..... One by mouth once daily    Lisinopril 40 Mg Tabs (Lisinopril) ..... One by mouth once daily    Glucophage Xr 500 Mg Tb24 (Metformin hcl) .Marland Kitchen... 2 tablets by mouth two times a day    Apidra Solostar 100 Unit/ml Soln (Insulin glulisine) .Marland KitchenMarland KitchenMarland KitchenMarland Kitchen 10-20 units subcutaneously before meals or snacks as directed three times a day  Orders: T-Basic Metabolic Panel [id number]) T- Hemoglobin A1C ([id number]) T-Urine Microalbumin w/creat. ratio [id number])  Labs Reviewed: Creat: 0.84 (05/02/2010)     Last Eye Exam: normal (01/18/2010) Reviewed HgBA1c results: 8.5 (05/02/2010)  10.4 (12/04/2009)  Complete Medication List: 1)  Lantus Solostar 100 Unit/ml Soln (Insulin glargine) .... 45-55  units subcutaneously once daily 2)  Low-dose Aspirin 81  Mg Tabs (Aspirin) .... One by mouth once daily 3)  Lisinopril 40 Mg Tabs (Lisinopril) .... One by mouth once daily 4)  Glucophage Xr 500 Mg Tb24 (Metformin hcl) .... 2 tablets by mouth two times a day 5)  Crestor 20 Mg Tabs (Rosuvastatin calcium) .... One by mouth qd 6)  Bd U/f Short Pen Needle 31g X 8 Mm Misc (Insulin pen needle) .... For two times a day injection 7)  Nifedipine Cr Osmotic 90 Mg Xr24h-tab (Nifedipine) .... One by mouth once daily in evening 8)  Centrum Tabs (Multiple vitamins-minerals) .... Take 1 tablet by mouth once a day 9)  Apidra Solostar 100 Unit/ml Soln (Insulin glulisine) .Marland Kitchen.. 10-20 units subcutaneously before meals or snacks as directed three times a day  Other Orders: EKG w/ Interpretation (93000) T-Lipid Profile  ([id number]) T-Hepatic Function [phone])  Patient Instructions: 1)  Take nifedipine at night 2)  Eat snack when you wake up in the morning before you go to work 3)  Check your blood sugar and blood pressure when you get to work 4)  Call our office if your fluttering does not get better 5)  Please schedule a follow-up appointment in 3 months.   Orders Added: 1)  EKG w/ Interpretation [93000] 2)  T-Basic Metabolic Panel [[id number]] 3)  T-Lipid Profile [80061-22930] 4)  T-Hepatic Function [80076-22960] 5)  T- Hemoglobin A1C [83036-23375] 6)  T-Urine Microalbumin w/creat. ratio [[id number]] 7)  Est. Patient Level III [62952]    Current Allergies (reviewed today): ! HYDROCHLOROTHIAZIDE (HYDROCHLOROTHIAZIDE) NORVASC

## 2010-09-27 NOTE — Progress Notes (Signed)
Summary: refills-- lantus, crestor, nifedipine  Phone Note Refill Request Message from:  Patient on August 08, 2010 3:40 PM  Refills Requested: Medication #1:  LANTUS SOLOSTAR 100 UNIT/ML  SOLN 45-55  units subcutaneously once daily   Dosage confirmed as above?Dosage Confirmed   Supply Requested: 1 month   Last Refilled: 03/23/2010  Medication #2:  CRESTOR 20 MG TABS one by mouth qd   Dosage confirmed as above?Dosage Confirmed   Supply Requested: 1 month   Last Refilled: 07/23/2010   Notes: Pt states pharmacy did not receive refill from November.  Medication #3:  NIFEDIPINE CR OSMOTIC 90 MG XR24H-TAB one by mouth once daily in evening   Dosage confirmed as above?Dosage Confirmed   Supply Requested: 1 month [place] to [place] and gave refill on Lantus Solostar as below. She states Crestor still has 2 refills remaining and Nifedipine still has 5 refills remaining. She will go ahead and get these filled.  Next Appointment Scheduled: 10-31-10 Dr Artist Pais Initial call taken by: Mervin Kung CMA Duncan Dull),  August 08, 2010 4:00 PM    Prescriptions: LANTUS SOLOSTAR 100 UNIT/ML  SOLN (INSULIN GLARGINE) 45-55  units subcutaneously once daily  #1 x 3   Entered by:   Mervin Kung CMA (AAMA)   Authorized by:   Lemont Fillers FNP   Signed by:   Mervin Kung CMA (AAMA) on 08/08/2010   Method used:   Electronically to        [facility] (retail)       [address]       [place], [place]  [address]       Ph: [phone]       Fax: [phone]   RxID:   [id number]

## 2010-10-31 ENCOUNTER — Ambulatory Visit (INDEPENDENT_AMBULATORY_CARE_PROVIDER_SITE_OTHER): Payer: BC Managed Care – PPO | Admitting: Internal Medicine

## 2010-10-31 ENCOUNTER — Encounter: Payer: Self-pay | Admitting: Internal Medicine

## 2010-10-31 DIAGNOSIS — IMO0001 Reserved for inherently not codable concepts without codable children: Secondary | ICD-10-CM

## 2010-10-31 LAB — CONVERTED CEMR LAB
Creatinine, Ser: 0.85 mg/dL (ref 0.40–1.50)
Potassium: 4.7 meq/L (ref 3.5–5.3)
Sodium: 138 meq/L (ref 135–145)

## 2010-11-01 ENCOUNTER — Encounter: Payer: Self-pay | Admitting: Internal Medicine

## 2010-11-06 NOTE — Letter (Signed)
    at [facility] [address] [place], [place]  [address]  [place] Phone: [phone]      November 01, 2010   DARIN ARNDT [address] [place], [place] [address]  RE:  LAB RESULTS  Dear  Mr. Masi,  The following is an interpretation of your most recent lab tests.  Please take note of any instructions provided or changes to medications that have resulted from your lab work.  ELECTROLYTES:  Good - no changes needed  KIDNEY FUNCTION TESTS:  Good - no changes needed    DIABETIC STUDIES:  Poor - schedule a follow-up appointment soon Blood Glucose: 163   HgbA1C: 10.0   Microalbumin/Creatinine Ratio: 4.2       Please remember to use your meal time insulin.     Sincerely Yours,    Dr. Thomos Lemons  Appended Document:  mailed

## 2010-11-22 NOTE — Assessment & Plan Note (Signed)
Summary: 3 month follow up/mhf   Vital Signs:  Patient profile:   60 year old male Height:      76 inches Weight:      207.50 pounds BMI:     25.35 O2 Sat:      94 % on Room air Temp:     97.4 degrees F oral Pulse rate:   83 / minute Resp:     18 per minute BP sitting:   120 / 80  (right arm) Cuff size:   large  Vitals Entered By: Glendell Docker CMA (October 31, 2010 8:14 AM)  O2 Flow:  Room air CC: 3 month follow up  Is Patient Diabetic? Yes Did you bring your meter with you today? No Pain Assessment Patient in pain? no      Comments low blood sugar 130 high 170 avg 160 elevation is during the morning hours,  fasting for blood work   Primary Care Provider:  D. Thomos Lemons DO  CC:  3 month follow up .  History of Present Illness: 60 y/o male for f/u pt only using short acting insulin 20 % of the time he forgets to bring to work not using consistently at home denies hypoglycemia  Preventive Screening-Counseling & Management  Alcohol-Tobacco     Smoking Status: quit  Allergies: 1)  ! Hydrochlorothiazide (Hydrochlorothiazide) 2)  Norvasc  Past History:  Past Medical History: Colonic polyps, hx of Diabetes mellitus, type II Hypertension  Hyperlipidemia     Low back pain     Right breat mass - 03/2008    Lipoma   Abnormal Myoview:  2006 EF 44% ? inferoseptal ischemia.  no cath done.  Past Surgical History: Lipoma surgery - Dr.Tseui 07/2008          Family History: Father deceased at age 61 secondary to melanoma.   Mother  alive at age 46 with hypertension .          Social History: Occupation: Music therapist Married   Former Smoker -  33 pack year history Alcohol use-no    Drug use-no          Physical Exam  General:  alert, well-developed, and well-nourished.   Lungs:  normal respiratory effort and normal breath sounds.   Heart:  normal rate, regular rhythm, and no gallop.   Extremities:  No lower extremity edema   Impression &  Recommendations:  Problem # 1:  DIABETES MELLITUS, TYPE II, UNCONTROLLED (ICD-250.02) Assessment Deteriorated stress importance of using mealtime insulin regularly  His updated medication list for this problem includes:    Lantus Solostar 100 Unit/ml Soln (Insulin glargine) .Marland KitchenMarland KitchenMarland KitchenMarland Kitchen 45-55  units subcutaneously once daily    Low-dose Aspirin 81 Mg Tabs (Aspirin) ..... One by mouth once daily    Lisinopril 40 Mg Tabs (Lisinopril) ..... One by mouth once daily    Glucophage Xr 500 Mg Tb24 (Metformin hcl) .Marland Kitchen... 2 tablets by mouth two times a day    Apidra Solostar 100 Unit/ml Soln (Insulin glulisine) .Marland KitchenMarland KitchenMarland KitchenMarland Kitchen 10-20 units subcutaneously before meals or snacks as directed three times a day  Orders: T-Basic Metabolic Panel [id number]) T- Hemoglobin A1C (09811-91478)  Problem # 2:  HYPERTENSION (ICD-401.9) Assessment: Unchanged  His updated medication list for this problem includes:    Lisinopril 40 Mg Tabs (Lisinopril) ..... One by mouth once daily    Nifedipine Cr Osmotic 90 Mg Xr24h-tab (Nifedipine) ..... One by mouth once daily in evening  BP today: 120/80 Prior BP: 110/80 (08/02/2010)  Labs Reviewed: K+: 4.7 (08/02/2010) Creat: : 0.83 (08/02/2010)   Chol: 215 (08/02/2010)   HDL: 46 (08/02/2010)   LDL: 144 (08/02/2010)   TG: 127 (08/02/2010)  Complete Medication List: 1)  Lantus Solostar 100 Unit/ml Soln (Insulin glargine) .... 45-55  units subcutaneously once daily 2)  Low-dose Aspirin 81 Mg Tabs (Aspirin) .... One by mouth once daily 3)  Lisinopril 40 Mg Tabs (Lisinopril) .... One by mouth once daily 4)  Glucophage Xr 500 Mg Tb24 (Metformin hcl) .... 2 tablets by mouth two times a day 5)  Crestor 20 Mg Tabs (Rosuvastatin calcium) .... One by mouth qd 6)  Bd U/f Short Pen Needle 31g X 8 Mm Misc (Insulin pen needle) .... For two times a day injection 7)  Nifedipine Cr Osmotic 90 Mg Xr24h-tab (Nifedipine) .... One by mouth once daily in evening 8)  Centrum Tabs (Multiple  vitamins-minerals) .... Take 1 tablet by mouth once a day 9)  Apidra Solostar 100 Unit/ml Soln (Insulin glulisine) .Marland Kitchen.. 10-20 units subcutaneously before meals or snacks as directed three times a day  Patient Instructions: 1)  Please schedule a follow-up appointment in 2 months.   Orders Added: 1)  T-Basic Metabolic Panel [[id number]] 2)  T- Hemoglobin A1C [83036-23375] 3)  Est. Patient Level III [16[address]]    Current Allergies (reviewed today): ! HYDROCHLOROTHIAZIDE (HYDROCHLOROTHIAZIDE) NORVASC

## 2010-12-18 ENCOUNTER — Telehealth: Payer: Self-pay | Admitting: Internal Medicine

## 2010-12-18 NOTE — Telephone Encounter (Signed)
Pt wife states that pt is out of lantis. She states that no refills left on prescription. Patient uses [facility] on [address]

## 2010-12-19 MED ORDER — INSULIN GLARGINE 100 UNIT/ML ~~LOC~~ SOLN: 45.0000 [IU] | Freq: Every day | SUBCUTANEOUS | Status: DC

## 2010-12-19 NOTE — Telephone Encounter (Signed)
Call placed to patient at [phone], spoke with patients wife Lynden Ang, she has confirmed patient is only requesting refill on Lantus. Rx sent to pharmacy

## 2010-12-27 ENCOUNTER — Encounter: Payer: Self-pay | Admitting: Internal Medicine

## 2010-12-28 ENCOUNTER — Encounter: Payer: Self-pay | Admitting: Internal Medicine

## 2010-12-28 ENCOUNTER — Ambulatory Visit (INDEPENDENT_AMBULATORY_CARE_PROVIDER_SITE_OTHER): Payer: BC Managed Care – PPO | Admitting: Internal Medicine

## 2010-12-28 VITALS — BP 120/80 | HR 84 | Temp 98.4°F | Resp 18 | Wt 200.0 lb

## 2010-12-28 DIAGNOSIS — E1165 Type 2 diabetes mellitus with hyperglycemia: Secondary | ICD-10-CM

## 2010-12-28 LAB — BASIC METABOLIC PANEL WITH GFR
BUN: 12 mg/dL (ref 6–23)
CO2: 25 mEq/L (ref 19–32)
Calcium: 9 mg/dL (ref 8.4–10.5)
Potassium: 4.7 mEq/L (ref 3.5–5.3)
Sodium: 139 mEq/L (ref 135–145)

## 2010-12-28 LAB — HEMOGLOBIN A1C
Hgb A1c MFr Bld: 8.6 % — ABNORMAL HIGH (ref <5.7)
Mean Plasma Glucose: 200 mg/dL — ABNORMAL HIGH (ref <117)

## 2010-12-28 NOTE — Progress Notes (Signed)
  Subjective:    Patient ID: [name], male    DOB: [birth year], 60 y.o.   MRN: [id number]  HPI  AM blood sugars 120's - 130's.  occ 90's. No hypoglycemia symptoms.  Some quizzy sensation at work.     Review of Systems     Objective:   Physical Exam        Assessment & Plan:

## 2011-01-07 ENCOUNTER — Other Ambulatory Visit: Payer: Self-pay | Admitting: Internal Medicine

## 2011-01-07 NOTE — Telephone Encounter (Signed)
Rx refill sent to pharmacy. 

## 2011-01-08 NOTE — Assessment & Plan Note (Signed)
[facility]                            CARDIOLOGY OFFICE NOTE   NAME:[name], [name]                     MRN:          [id number]  DATE:08/22/2008                            DOB:          Jul 23, 1951    [name] is a 60 year old patient referred for preop clearance.   The patient has multiple coronary risk factors including diabetes,  hypertension, hypercholesterolemia.  He had a Myoview study done  August 18, 2008.   He had diaphragmatic attenuation.  EF was estimated at 43%.  However, by  echo it was estimated at 50%, the study was somewhat difficult in  regards to imaging planes.  He had a small gradient across his aortic  valve with mild aortic stenosis since the patient is currently not  having chest pain, PND, or orthopnea; no palpitations or syncope, and he  has relatively low-risk lipoma surgery.  He is cleared.  I told him that  he did have a faint murmur and that we would have to follow his aortic  valve.  His findings in regards to his EF are more consistent with the  nonischemic cardiomyopathy.   We will probably do a followup MRI to quantitate his EF in 6 months.  Otherwise, I think he is doing fine.   I told him to call Dr. Christiana Fuchs office to schedule his surgery.   He is currently on insulin as indicated by his primary MD and aspirin a  day, lisinopril 20 a day, metformin 1 g b.i.d., Zocor 40 a day,  amlodipine 5 a day, and Lantus 40 units in the morning.   PHYSICAL EXAMINATION:  GENERAL:  Remarkable for healthy-appearing black  male in no distress.  VITAL SIGNS:  Weight is 198, blood pressure 130/86, pulse 80 and  regular, respiratory rate 14, afebrile.  HEENT:  Unremarkable.  Carotids are without bruit.  No lymphadenopathy,  thyromegaly, or JVP elevation.  LUNGS:  Clear.  Good diaphragmatic motion.  No wheezing.  He has a large  lipoma with left trapezius.  S1, S2 with the faint systolic murmur.  PMI  normal.  ABDOMEN:   Benign.  Bowel sounds positive.  No AAA, no tenderness, no  bruit, no hepatosplenomegaly, hepatojugular reflux, or tenderness.  Distal pulses intact.  No edema.  NEURO:  Nonfocal.  SKIN:  Warm and dry.  No muscular weakness.   IMPRESSION:  1. In regards to preop clearance, he has had low-risk Myoview and is      currently asymptomatic, undergoing low-risk lipoma surgery.  He is      cleared for surgery.  2. In regards to mildly decreased left ventricular function, currently      asymptomatic.  Followup cardiac MRI in 6 months to quantitate      ejection fraction.  3. Hypertension, currently well controlled.  Continue current dose of      lisinopril.  4. Hyperlipidemia.  Continue Zocor.  Lipid and liver profile in 6      months.  5. Diabetes.  Continue Lantus with sliding scale for meal coverage,      followup primary care MD, hemoglobin  A1c quarterly.   We will call Dr. Fatima Sanger office today to clear him for surgery.     Noralyn Pick. Eden Emms, MD, [facility]  Electronically Signed    PCN/MedQ  DD: 08/22/2008  DT: 08/22/2008  Job #: [id number]   cc:   Wilmon Arms. Tsuei, M.D.

## 2011-01-08 NOTE — Assessment & Plan Note (Signed)
[facility]                            CARDIOLOGY OFFICE NOTE   NAME:[name], [name]                     MRN:          [id number]  DATE:08/11/2008                            DOB:          03/06/1951    [name] is a 56-year patient referred by Dr. Corliss [name] for preop  clearance.   Ms. Hnat is a previous smoker with hypertension, diabetes,  hypercholesterolemia.  His diabetes is not particularly well controlled.  I do not have his hemoglobin A1c, but he tells me his morning sugars run  in the 150 range.   He was referred for a Myoview study back in 2006.  It was read as  abnormal by Dr. Jens [name].  There is a question of inferoseptal ischemia.  The EF was estimated at 44%, but visually appeared normal.  Unfortunately, he did not have any further workup for this.  He did not  have a followup echo to document LV function and has not had any further  studies to rule out coronary artery disease.  He has been a diabetic for  3-4 years.   He does not have any other end-organ damage.   He has not previously had any major surgery.   He has a fairly large lipoma over the right shoulder which needs  exploration and extraction.   The surgical date has not been set yet.   In talking to the patient, he is active.  He is a Music therapist.  He does  not have any significant PND or orthopnea.  There is occasionally mild  dyspnea, particularly going upstairs; however, he has not had any  episodes of palpitations, syncope.  In general, he is active as he wants  to be.  His work as a Music therapist, is demanding, and he does not have a  lot of outside hobbies or activities.   REVIEW OF SYSTEMS:  Remarkable for no lower extremity edema, no reflux,  no history of asthma or chronic lung issues, no history of bleeding  diathesis.   FAMILY HISTORY:  Noncontributory.   ALLERGIES:  He is allergic to [name] which causes nausea.   SOCIAL HISTORY:  He is a Music therapist at  [employer].  He is married.  He had two  children.  His son apparently died.  He quit smoking 2 years ago.  He  again does not have a lot of outside hobbies and he has been in a  Music therapist by trade for quite some time.   CURRENT MEDICATIONS:  1. Metformin 500 b.i.d.  2. Simvastatin 40 a day.  3. Crestor 20 a day.  4. Lisinopril 40 a day.  5. Nifedipine 30 a day.  6. Glimepiride 1 mg a day.  7. Aspirin a day.   PHYSICAL EXAMINATION:  GENERAL:  Remarkable for a thin black male in no  distress.  He has a large lipoma over the left trapezius muscle.  VITAL SIGNS:  His blood pressure is 130/80, pulse is 78 and regular,  respiratory rate 14, afebrile, weight 200.  HEENT:  Unremarkable.  NECK:  Carotids are normal without bruit.  No lymphadenopathy,  thyromegaly,  or JVP elevation.  LUNGS:  Clear.  Good diaphragmatic motion.  No wheezing.  CARDIAC:  S1, S2 with normal heart sounds.  PMI normal.  ABDOMEN:  Benign.  Bowel sounds positive.  No AAA.  No tenderness.  No  bruit.  No hepatosplenomegaly or hepatojugular reflux.  No tenderness.  EXTREMITIES:  Distal pulses are intact.  No edema.  NEUROLOGIC:  Nonfocal.  SKIN:  Warm and dry.  MUSCULOSKELETAL:  No muscular weakness.   His EKG shows sinus rhythm, nonspecific ST-T wave changes, possible LVH  in the limb leads.   IMPRESSION:  1. Preoperative clearance.  The patient has had an abnormal Myoview 3      years ago and is a diabetic.  He needs a followup Myoview study.      Continue aspirin therapy.  2. History of decreased left ventricular function by Myoview,      nonspecific EKG changes suggesting left ventricular hypertrophy.      Followup 2D echocardiogram to assess left ventricular mass and      ejection fraction.  3. Hypertension, currently well controlled.  Continue current dose of      nifedipine and lisinopril.  4. Hypercholesterolemia in the setting of diabetes.  Continue statin      therapy.  Lipid and liver profile in 6  months.  5. Need for lipoma surgery.  As long as the patient's adenosine      Myoview and echocardiogram are low-risk, I will clear him for      surgery as soon as possible.  The patient is asymptomatic.  It is      not high risk surgery.  His previous Myoview, although abnormal on      two accounts would appear to have been low risk.   However, he clearly needs further workup before having general  anesthesia.     [name]. [name] Emms, MD, [name]  Electronically Signed    PCN/MedQ  DD: 08/11/2008  DT: 08/11/2008  Job #: [id number]

## 2011-01-11 NOTE — H&P (Signed)
NAME:  [name], [name] NO.:  [id number]   MEDICAL RECORD NO.:  [id number]          PATIENT TYPE:  INP   LOCATION:                               FACILITY:  [facility]   PHYSICIAN:  Thomos Lemons, D.O. [facility]   DATE OF BIRTH:  Sep 26, 1950   DATE OF ADMISSION:  DATE OF DISCHARGE:                                HISTORY & PHYSICAL   CHIEF COMPLAINT:  New patient to practice, loss of appetite, and weight  loss.   HISTORY OF PRESENT ILLNESS:  Patient is a 60 year old African-American male  who has not had a primary care doctor in the past who comes to see [place] today  to establish new patient/physician relationship.  Patient states that one  month ago after many years of tobacco use his wife encouraged him to  discontinue smoking.  Since that time patient states that he has had  multiple symptoms of frequent urination, dry mouth, loss of appetite, and  general fatigue.  Patient denies any history of heart disease or diabetes or  lung disease.  Patient's only past medical history is childhood chicken pox  and tonsils and adenoids in 1972.   SOCIAL HISTORY:  Patient is married, lives with his wife.  His occupation is  a Music therapist.  He quit smoking one month ago, but has a 33-year pack-year  history.  Denies any alcohol use.  No history of recreational drug use.   FAMILY HISTORY:  Father deceased at age 79 secondary to melanoma.  Mother  alive at age 64 with hypertension.  Otherwise, noncontributory.   REVIEW OF SYSTEMS:  Denies any history of chest discomfort or chest  heaviness with exertion.  Patient works as a Music therapist.  Does own a large  amount of property and he has noticed recently that he has experienced some  increased dyspnea on exertion.  In addition, during this one month time  period after he discontinued smoking he has lost approximately 15 pounds.  Patient denies any HEENT symptoms.  No chronic cough.  No heartburn, nausea,  vomiting, constipation, diarrhea, polyuria,  and feels thirsty most of the  time and all other systems negative.   PHYSICAL EXAMINATION:  VITAL SIGNS:  Height is 6 feet 4 inches, weight 167  pounds, temperature 98.2, pulse 90, blood pressure 138/92.  GENERAL:  The patient is a thin 60 year old African-American male, pleasant,  in no apparent distress.  HEENT:  Normocephalic, atraumatic.  Pupils are equal, round, and reactive to  light bilaterally.  Extraocular motility was intact.  Patient was anicteric.  Conjunctiva was within normal limits.  Examination of external auditory  canals and tympanic membranes were clear bilaterally.  Oropharynx was  without any lesions.  NECK:  Did not reveal any adenopathy, carotid bruit, or thyromegaly.  RESPIRATORY:  Normal respiratory effort.  Chest was clear to auscultation  bilaterally.  No rhonchi, rales, no wheezing.  CARDIOVASCULAR:  No regular rate and rhythm.  No significant murmurs, rubs,  or gallops appreciated.  ABDOMEN:  Soft, nontender.  Positive bowel sounds.  No organomegaly.  RECTAL:  A prostate examination was performed.  Patient did  not have any  evidence of external hemorrhoids.  Normal rectal tone.  Prostate was normal  size and consistency.  No nodules or firmness.  MUSCULOSKELETAL:  No clubbing, cyanosis, edema.  SKIN:  Warm and dry.  NEUROLOGIC:  Cranial nerves II-XII was grossly intact.  No focal deficits.   An EKG was performed in the office which showed normal sinus rhythm at 93  beats per minute.  Patient had nonspecific ST changes in the inferior and  lateral leads.  No previous EKG for comparison.  Patient also had a chest x-  ray in the office which showed no active pulmonary disease.  Patient was  sent for routine laboratories and revealed CBC:  WBC 8.9, H&H of 16.2,  hematocrit 51.6, and platelet count of 246.  A comprehensive metabolic  profile showed sodium of 134, potassium 4.6, chloride 93, CO2 28, and a  blood sugar of 872 which was rechecked, a BUN of 8, and  a creatinine of 1.1.  Alkaline phosphatase was minimally elevated at 119 and ALT also minimally  elevated at 142, otherwise LFTs were within normal limits.  At admission a  fasting lipid profile was performed.  Total cholesterol 258, triglycerides  182, HDL was 34.9, and an LDL cholesterol was elevated at 187.  TSH was 0.96  and a PSA was 0.22.  Patient had a UA which was negative besides a urine  glucose greater than 1000.   IMPRESSION:  1.  Fatigue with polyuria and polydipsia secondary to hypoglycemia.  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  2.  History of tobacco abuse.  3.  Hyperlipidemia.  4.  Health maintenance.   RECOMMENDATIONS:  Due to patient's 15 pound weight loss and symptoms of  extreme fatigue I relayed to the patient that best course of action would be  to hospitalize him for IV hydration as well as IV insulin.  Once patient's  blood sugars are better controlled patient will most likely need to be  started on insulin for blood sugar control.  Patient does have a normal  creatinine and may be a candidate for oral agents.  We will check a  hemoglobin A1C while he is in the hospital.  I suspect that his numbers will  be quite high.  He has probably been an undiagnosed diabetic for many years.   With his history of tobacco abuse and recent dyspnea on exertion eventually  as an outpatient patient will be sent for a stress test.  In addition, I  recommended at some point obtaining pulmonary function tests with his  history of long-standing tobacco abuse.  We will consult a diabetic educator  while he is in the hospital and also provide insulin teaching.      Thomos Lemons, D.O. [facility]  Electronically Signed     RY/MEDQ  D:  04/25/2005  T:  04/25/2005  Job:  [id number]

## 2011-01-11 NOTE — Discharge Summary (Signed)
NAME:  [name], [name] NO.:  [id number]   MEDICAL RECORD NO.:  [id number]          PATIENT TYPE:  INP   LOCATION:  4707                         FACILITY:  [facility]   PHYSICIAN:  Wanda Plump, MD [facility]    DATE OF BIRTH:  1951-04-24   DATE OF ADMISSION:  04/25/2005  DATE OF DISCHARGE:  04/28/2005                                 DISCHARGE SUMMARY   PRIMARY CARE PHYSICIAN:  Dr. Thomos Lemons, [facility].   ADMITTING DIAGNOSIS:  New-onset of diabetes.   DISCHARGE DIAGNOSIS:  New-onset of diabetes.   BRIEF HISTORY AND PHYSICAL:  [name] is a 60 year old African-American  male who presented for the first time to Dr. Artist Pais for evaluation of frequent  urination, dry mouth, loss of appetite and generalized fatigue.  During that  evaluation he was found to have blood sugar of 872 and consequently he was  admitted for further care of his newly diagnosed diabetes.   PHYSICAL EXAMINATION:  The patient upon admission was alert, in no apparent  distress.  LUNGS:  Normal respiratory efforts.  CHEST:  Clear to auscultation bilaterally.  CARDIOVASCULAR:  Regular rate and rhythm without any murmur.  ABDOMEN:  Benign.  RECTAL:  There was a normal prostate without any nodules or tenderness.  NEUROLOGIC:  Examination was basically intact.   LABORATORY AND X-RAYS:  In Dr. Olegario Messier office EKG showed normal sinus rhythm  at 93 beats per minute with nonspecific ST changes.  There was no previous  EKG for comparison.  The patient also had a chest x-ray without any active  disease, white count 8.9, hemoglobin 16.2, platelets 246, sodium 134,  potassium 4.6, chloride 93, sugar again was 872, BUN 8, creatinine 1.1,  alkaline phosphatase 119 which is slightly elevated, ALT was also slightly  elevated at 142.  Total cholesterol was 258 with triglycerides of 182, HDL  34, LDL 187, TSH 10.96, PSA 0.22.  Hospital LFTs were repeated and they were  completely normal.  He also had one set of  cardiac enzymes which was normal.  The acetone in the blood was negative.  Hemoglobin A1c 16.8.   HOSPITAL COURSE:  The patient was admitted to the hospital and started on IV  fluids and Glucommander.  His hospital stay was uneventful.  During this  admission he received standard education about diabetes, I did emphasize the  need to recognize low blood sugar symptoms and also how to treat that.  He  was switched to Lantus 20 units a day, the blood sugar in the morning with  such dose was 231, Lantus was increased to 23 and the blood sugar dropped to  103.  At this point I think he got maximal hospital benefit and I will  discharge this patient with the following instructions.  1.  Follow a diabetic diet as instructed.  2.  Lantus 18 units subcu at night.  3.  Metformin 500 mg one p.o. b.i.d.  4.  Check the blood sugar twice a day before breakfast and before lunch.  5.  If he recognizes low blood sugar symptoms, he is to check  his sugar and      also to drink orange juice.  6.  He is to call Dr. Artist Pais and get an appointment to see him within six days.  7.  Rest of medical problems to be evaluated as an outpatient.      Wanda Plump, MD [facility]  Electronically Signed     JEP/MEDQ  D:  04/28/2005  T:  04/28/2005  Job:  [id number]   cc:   Thomos Lemons, D.O. [facility]  [address] [place], [place] [address]

## 2011-01-16 ENCOUNTER — Telehealth: Payer: Self-pay | Admitting: Internal Medicine

## 2011-01-16 MED ORDER — NIFEDIPINE 90 MG (OSM) PO TB24: 90.0000 mg | ORAL_TABLET | Freq: Every day | ORAL | Status: DC

## 2011-01-16 MED ORDER — LISINOPRIL 40 MG PO TABS: 40.0000 mg | ORAL_TABLET | Freq: Every day | ORAL | Status: DC

## 2011-01-16 NOTE — Telephone Encounter (Signed)
RX refill sent to pharmacy.

## 2011-01-16 NOTE — Telephone Encounter (Signed)
Pt wife states that pharmacy told them to call us to get refills. Pt is out of lisinopril and nifedipine. [facility] on [address].

## 2011-03-29 ENCOUNTER — Ambulatory Visit (INDEPENDENT_AMBULATORY_CARE_PROVIDER_SITE_OTHER): Payer: BC Managed Care – PPO | Admitting: Internal Medicine

## 2011-03-29 ENCOUNTER — Ambulatory Visit: Payer: BC Managed Care – PPO | Admitting: Internal Medicine

## 2011-03-29 ENCOUNTER — Encounter: Payer: Self-pay | Admitting: Internal Medicine

## 2011-03-29 DIAGNOSIS — E119 Type 2 diabetes mellitus without complications: Secondary | ICD-10-CM

## 2011-03-29 DIAGNOSIS — I10 Essential (primary) hypertension: Secondary | ICD-10-CM

## 2011-03-29 DIAGNOSIS — E785 Hyperlipidemia, unspecified: Secondary | ICD-10-CM

## 2011-03-29 LAB — HEPATIC FUNCTION PANEL
Bilirubin, Direct: 0.1 mg/dL (ref 0.0–0.3)
Total Bilirubin: 0.4 mg/dL (ref 0.3–1.2)

## 2011-03-29 LAB — CBC WITH DIFFERENTIAL/PLATELET
Eosinophils Absolute: 0.1 10*3/uL (ref 0.0–0.7)
Lymphocytes Relative: 35 % (ref 12–46)
Lymphs Abs: 2.4 10*3/uL (ref 0.7–4.0)
MCHC: 32.2 g/dL (ref 30.0–36.0)
MCV: 73.4 fL — ABNORMAL LOW (ref 78.0–100.0)
Neutro Abs: 3.7 10*3/uL (ref 1.7–7.7)
Neutrophils Relative %: 54 % (ref 43–77)
Platelets: 299 10*3/uL (ref 150–400)
RBC: 5.97 MIL/uL — ABNORMAL HIGH (ref 4.22–5.81)
RDW: 15.5 % (ref 11.5–15.5)
WBC: 6.8 10*3/uL (ref 4.0–10.5)

## 2011-03-29 LAB — LIPID PANEL
LDL Cholesterol: 80 mg/dL (ref 0–99)
Total CHOL/HDL Ratio: 3 Ratio
VLDL: 16 mg/dL (ref 0–40)

## 2011-03-29 LAB — BASIC METABOLIC PANEL
Chloride: 103 mEq/L (ref 96–112)
Sodium: 138 mEq/L (ref 135–145)

## 2011-03-29 NOTE — Assessment & Plan Note (Signed)
Obtain lipid/lft. 

## 2011-03-29 NOTE — Patient Instructions (Signed)
Please schedule chem7, a1c 250.0 prior to next visit Increase your lantus insulin dose 2 units every 3 days until your morning fasting blood sugars are consistently less than 130

## 2011-03-29 NOTE — Assessment & Plan Note (Signed)
Obtain chem7, a1c. Increase lantus 2 units q3 days until fasting am sugars consistently less than 130.

## 2011-03-29 NOTE — Progress Notes (Signed)
  Subjective:    Patient ID: [name], male    DOB: 1951-05-12, 60 y.o.   MRN: [id number]  HPI  Pt presents to clinic for followup of multiple medical problems.  States am fsbs avg 130 without hypoglycemia. Uses lantus 50 units qd. Tolerates statin tx without myalgias or abn lfts. BP minimally elevated but typically normotensive. No active complaints.   Reviewed pmh, medications and allergies    Review of Systems  Constitutional: Negative for appetite change and unexpected weight change.  Respiratory: Negative for shortness of breath.   Cardiovascular: Negative for chest pain.       Objective:   Physical Exam  Nursing note and vitals reviewed. Constitutional: He appears well-developed and well-nourished. No distress.  HENT:  Head: Normocephalic and atraumatic.  Right Ear: External ear normal.  Left Ear: External ear normal.  Eyes: Conjunctivae are normal. No scleral icterus.  Cardiovascular: Normal rate, regular rhythm and normal heart sounds.  Exam reveals no gallop and no friction rub.   No murmur heard. Pulmonary/Chest: Effort normal and breath sounds normal. No respiratory distress. He has no wheezes. He has no rales.  Neurological: He is alert.  Skin: Skin is warm and dry. He is not diaphoretic.  Psychiatric: He has a normal mood and affect.          Assessment & Plan:

## 2011-03-30 LAB — HEMOGLOBIN A1C
Hgb A1c MFr Bld: 8.4 % — ABNORMAL HIGH (ref <5.7)
Mean Plasma Glucose: 194 mg/dL — ABNORMAL HIGH (ref <117)

## 2011-03-30 LAB — MICROALBUMIN / CREATININE URINE RATIO
Creatinine, Urine: 122.2 mg/dL
Microalb, Ur: 0.5 mg/dL (ref 0.00–1.89)

## 2011-04-25 ENCOUNTER — Other Ambulatory Visit: Payer: Self-pay | Admitting: *Deleted

## 2011-04-25 MED ORDER — INSULIN GLARGINE 100 UNIT/ML ~~LOC~~ SOLN: 45.0000 [IU] | Freq: Every day | SUBCUTANEOUS | Status: DC

## 2011-04-25 NOTE — Telephone Encounter (Signed)
Patients wife Lynden Ang called and left voice message stating the patient is requesting a refill on his Lantus. Her message states the patient will be out of the medication soon.  Rx refill sent to pharmacy.

## 2011-05-20 ENCOUNTER — Other Ambulatory Visit: Payer: Self-pay | Admitting: Internal Medicine

## 2011-06-06 ENCOUNTER — Other Ambulatory Visit: Payer: Self-pay | Admitting: Internal Medicine

## 2011-06-21 ENCOUNTER — Other Ambulatory Visit: Payer: Self-pay | Admitting: Internal Medicine

## 2011-06-25 ENCOUNTER — Other Ambulatory Visit: Payer: Self-pay | Admitting: *Deleted

## 2011-06-25 MED ORDER — METFORMIN HCL ER 500 MG PO TB24: 500.0000 mg | ORAL_TABLET | Freq: Every day | ORAL | Status: DC

## 2011-06-27 ENCOUNTER — Ambulatory Visit: Payer: BC Managed Care – PPO | Admitting: Internal Medicine

## 2011-07-15 ENCOUNTER — Ambulatory Visit: Payer: BC Managed Care – PPO | Admitting: Internal Medicine

## 2011-08-28 ENCOUNTER — Telehealth: Payer: Self-pay | Admitting: Internal Medicine

## 2011-08-28 MED ORDER — NIFEDIPINE ER OSMOTIC RELEASE 90 MG PO TB24: 90.0000 mg | ORAL_TABLET | Freq: Every day | ORAL | Status: DC

## 2011-08-28 NOTE — Telephone Encounter (Signed)
Refill- procardia xl 90mg  crtab. Take one tablet by mouth every day. Qty 30 last fill 11.24.12

## 2011-08-28 NOTE — Telephone Encounter (Signed)
rx sent in electronically 

## 2011-09-09 ENCOUNTER — Other Ambulatory Visit: Payer: Self-pay | Admitting: *Deleted

## 2011-09-09 MED ORDER — INSULIN GLARGINE 100 UNIT/ML ~~LOC~~ SOLN: 45.0000 [IU] | Freq: Every day | SUBCUTANEOUS | Status: DC

## 2011-09-16 ENCOUNTER — Ambulatory Visit: Payer: BC Managed Care – PPO | Admitting: Internal Medicine

## 2011-10-08 ENCOUNTER — Other Ambulatory Visit: Payer: Self-pay | Admitting: *Deleted

## 2011-10-08 MED ORDER — METFORMIN HCL ER 500 MG PO TB24: 1000.0000 mg | ORAL_TABLET | Freq: Every day | ORAL | Status: DC

## 2011-10-10 ENCOUNTER — Ambulatory Visit (HOSPITAL_BASED_OUTPATIENT_CLINIC_OR_DEPARTMENT_OTHER)
Admission: RE | Admit: 2011-10-10 | Discharge: 2011-10-10 | Disposition: A | Discharge status: Home / Self Care | Payer: BC Managed Care – PPO | Source: Ambulatory Visit | Attending: Internal Medicine | Admitting: Internal Medicine

## 2011-10-10 ENCOUNTER — Ambulatory Visit (INDEPENDENT_AMBULATORY_CARE_PROVIDER_SITE_OTHER): Payer: BC Managed Care – PPO | Admitting: Internal Medicine

## 2011-10-10 ENCOUNTER — Encounter: Payer: Self-pay | Admitting: Internal Medicine

## 2011-10-10 DIAGNOSIS — E119 Type 2 diabetes mellitus without complications: Secondary | ICD-10-CM

## 2011-10-10 DIAGNOSIS — R0789 Other chest pain: Secondary | ICD-10-CM

## 2011-10-10 DIAGNOSIS — I10 Essential (primary) hypertension: Secondary | ICD-10-CM

## 2011-10-10 DIAGNOSIS — IMO0001 Reserved for inherently not codable concepts without codable children: Secondary | ICD-10-CM

## 2011-10-10 DIAGNOSIS — R079 Chest pain, unspecified: Secondary | ICD-10-CM

## 2011-10-10 DIAGNOSIS — E785 Hyperlipidemia, unspecified: Secondary | ICD-10-CM

## 2011-10-10 MED ORDER — INSULIN GLARGINE 100 UNIT/ML ~~LOC~~ SOLN: 40.0000 [IU] | Freq: Every day | SUBCUTANEOUS | Status: DC

## 2011-10-10 MED ORDER — GLUCOSE BLOOD VI STRP: ORAL_STRIP | Status: DC

## 2011-10-10 MED ORDER — ROSUVASTATIN CALCIUM 20 MG PO TABS: 20.0000 mg | ORAL_TABLET | Freq: Every day | ORAL | Status: DC

## 2011-10-10 MED ORDER — INSULIN GLULISINE 100 UNIT/ML ~~LOC~~ SOLN: 10.0000 [IU] | Freq: Three times a day (TID) | SUBCUTANEOUS | Status: DC

## 2011-10-10 MED ORDER — INSULIN PEN NEEDLE 31G X 8 MM MISC: Status: DC

## 2011-10-10 MED ORDER — LISINOPRIL 20 MG PO TABS: ORAL_TABLET | ORAL | Status: DC

## 2011-10-10 NOTE — Patient Instructions (Signed)
Please schedule chem7, a1c 250.0 prior to next visit 250.0

## 2011-10-11 LAB — CBC
MCHC: 32.2 g/dL (ref 30.0–36.0)
MCV: 73.2 fL — ABNORMAL LOW (ref 78.0–100.0)
Platelets: 298 10*3/uL (ref 150–400)
RDW: 15.4 % (ref 11.5–15.5)
WBC: 4.5 10*3/uL (ref 4.0–10.5)

## 2011-10-11 LAB — HEPATIC FUNCTION PANEL
ALT: 20 U/L (ref 0–53)
AST: 19 U/L (ref 0–37)
Alkaline Phosphatase: 58 U/L (ref 39–117)
Bilirubin, Direct: 0.1 mg/dL (ref 0.0–0.3)

## 2011-10-11 LAB — LIPID PANEL
Cholesterol: 214 mg/dL — ABNORMAL HIGH (ref 0–200)
Total CHOL/HDL Ratio: 5 Ratio

## 2011-10-11 LAB — HEMOGLOBIN A1C
Hgb A1c MFr Bld: 7.7 % — ABNORMAL HIGH (ref <5.7)
Mean Plasma Glucose: 174 mg/dL — ABNORMAL HIGH (ref <117)

## 2011-10-11 LAB — BASIC METABOLIC PANEL
Chloride: 105 mEq/L (ref 96–112)
Creat: 0.82 mg/dL (ref 0.50–1.35)
Potassium: 4.4 mEq/L (ref 3.5–5.3)

## 2011-10-20 ENCOUNTER — Encounter: Payer: Self-pay | Admitting: Internal Medicine

## 2011-10-20 DIAGNOSIS — R079 Chest pain, unspecified: Secondary | ICD-10-CM | POA: Insufficient documentation

## 2011-10-20 NOTE — Progress Notes (Signed)
  Subjective:    Patient ID: [name], male    DOB: 16-Feb-1951, 61 y.o.   MRN: [id number]  HPI Pt presents to clinic for followup of multiple medical problems. Notes intermittent left sided chest pressure non radiating and nonexertional. Duration may last up to an hour followed by spontaneous resolution.no current discomfort. Not associated with dyspnea, n/v or diaphoresis. Cad risk factors included dm, htn and hyperlipidemia. bp mildly elevated but out of medications. States fsbs 90-120 without hypoglycemia.   Past Medical History  Diagnosis Date  . History of colonic polyps   . Diabetes mellitus type II   . Hypertension   . Hyperlipidemia   . Low back pain   . Breast mass, right 03/2008  . Lipoma   . Abnormal myocardial perfusion study 2006    EF 44% ? inferoseptal ischemia. no cath done   Past Surgical History  Procedure Date  . Lipoma excision 07/2008     Dr Harlon Flor    reports that he has quit smoking. He has never used smokeless tobacco. He reports that he does not drink alcohol or use illicit drugs. family history includes Hypertension in his mother and Melanoma (age of onset:78) in his father. Allergies  Allergen Reactions  . Amlodipine Besylate     REACTION: ? caused left axillary nodules  . Hydrochlorothiazide      Review of Systems see hpi     Objective:   Physical Exam  Physical Exam  Nursing note and vitals reviewed. Constitutional: Appears well-developed and well-nourished. No distress.  HENT:  Head: Normocephalic and atraumatic.  Right Ear: External ear normal.  Left Ear: External ear normal.  Eyes: Conjunctivae are normal. No scleral icterus.  Neck: Neck supple. Carotid bruit is not present.  Cardiovascular: Normal rate, regular rhythm and normal heart sounds.  Exam reveals no gallop and no friction rub.   No murmur heard. Pulmonary/Chest: Effort normal and breath sounds normal. No respiratory distress. He has no wheezes. no rales.    Lymphadenopathy:    He has no cervical adenopathy.  Neurological:Alert.  Skin: Skin is warm and dry. Not diaphoretic.  Psychiatric: Has a normal mood and affect.        Assessment & Plan:

## 2011-10-20 NOTE — Assessment & Plan Note (Signed)
Mild elevation. Resume bp medications and monitor

## 2011-10-20 NOTE — Assessment & Plan Note (Signed)
Obtain labs today. Resume and rf medications.

## 2011-10-20 NOTE — Assessment & Plan Note (Signed)
EKG obtained demonstrating nsr 83 with pac noted. Nl intervals and axis. No evidence of acute ischemic change. Proceed with nuclear stress test and cxr.

## 2011-10-22 ENCOUNTER — Ambulatory Visit (HOSPITAL_COMMUNITY): Payer: BC Managed Care – PPO | Attending: Cardiovascular Disease | Admitting: Radiology

## 2011-10-22 DIAGNOSIS — R0609 Other forms of dyspnea: Secondary | ICD-10-CM | POA: Insufficient documentation

## 2011-10-22 DIAGNOSIS — E119 Type 2 diabetes mellitus without complications: Secondary | ICD-10-CM

## 2011-10-22 DIAGNOSIS — Z87891 Personal history of nicotine dependence: Secondary | ICD-10-CM | POA: Insufficient documentation

## 2011-10-22 DIAGNOSIS — R0989 Other specified symptoms and signs involving the circulatory and respiratory systems: Secondary | ICD-10-CM | POA: Insufficient documentation

## 2011-10-22 DIAGNOSIS — R0789 Other chest pain: Secondary | ICD-10-CM | POA: Insufficient documentation

## 2011-10-22 DIAGNOSIS — I10 Essential (primary) hypertension: Secondary | ICD-10-CM | POA: Insufficient documentation

## 2011-10-22 DIAGNOSIS — R0602 Shortness of breath: Secondary | ICD-10-CM

## 2011-10-22 DIAGNOSIS — E785 Hyperlipidemia, unspecified: Secondary | ICD-10-CM | POA: Insufficient documentation

## 2011-10-22 DIAGNOSIS — R079 Chest pain, unspecified: Secondary | ICD-10-CM

## 2011-10-22 DIAGNOSIS — J4489 Other specified chronic obstructive pulmonary disease: Secondary | ICD-10-CM | POA: Insufficient documentation

## 2011-10-22 DIAGNOSIS — Z794 Long term (current) use of insulin: Secondary | ICD-10-CM | POA: Insufficient documentation

## 2011-10-22 DIAGNOSIS — J449 Chronic obstructive pulmonary disease, unspecified: Secondary | ICD-10-CM | POA: Insufficient documentation

## 2011-10-22 DIAGNOSIS — I4949 Other premature depolarization: Secondary | ICD-10-CM

## 2011-10-22 MED ORDER — TECHNETIUM TC 99M TETROFOSMIN IV KIT
11.0000 | PACK | Freq: Once | INTRAVENOUS | Status: AC | PRN
  Administered 2011-10-22: 11 via INTRAVENOUS

## 2011-10-22 MED ORDER — TECHNETIUM TC 99M TETROFOSMIN IV KIT
33.0000 | PACK | Freq: Once | INTRAVENOUS | Status: AC | PRN
  Administered 2011-10-22: 33 via INTRAVENOUS

## 2011-10-22 NOTE — Progress Notes (Signed)
[facility] SITE 3 NUCLEAR MED [address] [place] [address] [phone]  Cardiology Nuclear Med Study  [name] is a 61 y.o. male [id number] [birth year]   Nuclear Med Background Indication for Stress Test:  Evaluation for Ischemia History:  COPD and '09 Echo:EF=50%, mild AS; '09 MPS:No ischemia, EF=43% Cardiac Risk Factors: History of Smoking, Hypertension, IDDM Type 2 and Lipids  Symptoms:  Chest Pressure.  (last episode of chest discomfort was last week) and DOE   Nuclear Pre-Procedure Caffeine/Decaff Intake:  None NPO After: 7:30pm   Lungs:  Clear. IV 0.9% NS with Angio Cath:  20g  IV Site: R Antecubital  IV Started by:  Stanton Kidney, EMT-P  Chest Size (in):  42 Cup Size: n/a  Height: 6\' 4"  (1.93 m)  Weight:  203 lb (92.08 kg)  BMI:  Body mass index is 24.71 kg/(m^2). Tech Comments:  CBG=136 @ 6:30 am, per patient.    Nuclear Med Study 1 or 2 day study: 1 day  Stress Test Type:  Stress  Reading MD: Charlton Haws, MD  Order Authorizing Provider:  Charlynn Court, MD  Resting Radionuclide: Technetium 39m Tetrofosmin  Resting Radionuclide Dose: 11.0 mCi   Stress Radionuclide:  Technetium 90m Tetrofosmin  Stress Radionuclide Dose: 33.0 mCi           Stress Protocol Rest HR: 81 Stress HR: 141  Rest BP: Sitting:149/90  Standing:143/89 Stress BP: 220/102  Exercise Time (min): 7:00 METS: 7.0   Predicted Max HR: 160 bpm % Max HR: 88.12 bpm Rate Pressure Product: 95284   Dose of Adenosine (mg):  n/a Dose of Lexiscan: n/a mg  Dose of Atropine (mg): n/a Dose of Dobutamine: n/a mcg/kg/min (at max HR)  Stress Test Technologist: Smiley Houseman, CMA-N  Nuclear Technologist:  Domenic Polite, CNMT     Rest Procedure:  Myocardial perfusion imaging was performed at rest 45 minutes following the intravenous administration of Technetium 87m Tetrofosmin.  Rest ECG: LVH with strain.  Stress Procedure:  The patient exercised for seven minutes on the  treadmill utilizing the Bruce protocol.  The patient stopped due to fatigue and denied any chest pain.  There were ST-T wave changes and occasional PVC's and PAC's.  He had a hypertensive response to exercise, 220/102.  Technetium 24m Tetrofosmin was injected at peak exercise and myocardial perfusion imaging was performed after a brief delay.  Stress ECG: Resting and stress ECG LVH with strain  QPS Raw Data Images:  Patient motion noted. Stress Images:  Thinning inferoseptum and inferobase Rest Images:  Thinning of the inferoseptum and inferobase Subtraction (SDS):  SDS only 4 abnormal in the septum Transient Ischemic Dilatation (Normal <1.22):  0.96 Lung/Heart Ratio (Normal <0.45):  0.29  Quantitative Gated Spect Images QGS EDV:  127 ml QGS ESV:  65 ml QGS cine images:  Apical hypokinesis QGS EF: 49%  Impression Exercise Capacity:  Fair exercise capacity. BP Response:  Hypertensive blood pressure response. Clinical Symptoms:  There is dyspnea. ECG Impression:  LVH with strain Comparison with Prior Nuclear Study: 08/18/2008  Overall Impression:  Low risk study.  Thinning of the inferoseptum and inferobase.  Somewhat more apparent than images in 2009.  EF calculates as better than 2009 (43%)  No ischemia    Charlton Haws

## 2011-10-23 ENCOUNTER — Other Ambulatory Visit: Payer: Self-pay | Admitting: Internal Medicine

## 2011-10-23 DIAGNOSIS — E785 Hyperlipidemia, unspecified: Secondary | ICD-10-CM

## 2011-12-11 ENCOUNTER — Ambulatory Visit: Payer: BC Managed Care – PPO | Admitting: Internal Medicine

## 2011-12-16 ENCOUNTER — Other Ambulatory Visit: Payer: Self-pay | Admitting: *Deleted

## 2011-12-16 MED ORDER — NIFEDIPINE ER OSMOTIC RELEASE 90 MG PO TB24: 90.0000 mg | ORAL_TABLET | Freq: Every day | ORAL | Status: DC

## 2011-12-16 NOTE — Telephone Encounter (Signed)
Patient's wife called and left voice message requesting a refill on Nifedipine.Rx refill sent to pharmacy.

## 2011-12-17 ENCOUNTER — Ambulatory Visit (INDEPENDENT_AMBULATORY_CARE_PROVIDER_SITE_OTHER): Payer: BC Managed Care – PPO | Admitting: Internal Medicine

## 2011-12-17 ENCOUNTER — Encounter: Payer: Self-pay | Admitting: Internal Medicine

## 2011-12-17 VITALS — BP 122/80 | HR 87 | Temp 98.0°F | Ht 76.0 in | Wt 207.0 lb

## 2011-12-17 DIAGNOSIS — N529 Male erectile dysfunction, unspecified: Secondary | ICD-10-CM

## 2011-12-17 DIAGNOSIS — I10 Essential (primary) hypertension: Secondary | ICD-10-CM

## 2011-12-17 DIAGNOSIS — E785 Hyperlipidemia, unspecified: Secondary | ICD-10-CM

## 2011-12-17 MED ORDER — VARDENAFIL HCL 20 MG PO TABS: 20.0000 mg | ORAL_TABLET | Freq: Every day | ORAL | Status: DC | PRN

## 2011-12-17 NOTE — Progress Notes (Signed)
  Subjective:    Patient ID: [name], male    DOB: 1951/04/23, 61 y.o.   MRN: [id number]  HPI Pt presents to clinic for followup of multiple medical problems. Reviewed nuclear stress test without evidence of ischemia. bp improved with resumption of medication. Reports fsbs avg 110-115 with low of 89. Previous ldl elevated but now has resumed statin. H/o ED and requests prescription for levitra. Tolerated in the past.   Past Medical History  Diagnosis Date  . History of colonic polyps   . Diabetes mellitus type II   . Hypertension   . Hyperlipidemia   . Low back pain   . Breast mass, right 03/2008  . Lipoma   . Abnormal myocardial perfusion study 2006    EF 44% ? inferoseptal ischemia. no cath done   Past Surgical History  Procedure Date  . Lipoma excision 07/2008     Dr Harlon Flor    reports that he has quit smoking. He has never used smokeless tobacco. He reports that he does not drink alcohol or use illicit drugs. family history includes Hypertension in his mother and Melanoma (age of onset:78) in his father. Allergies  Allergen Reactions  . Amlodipine Besylate     REACTION: ? caused left axillary nodules  . Hydrochlorothiazide       Review of Systems see hpi     Objective:   Physical Exam  Physical Exam  Nursing note and vitals reviewed. Constitutional: Appears well-developed and well-nourished. No distress.  HENT:  Head: Normocephalic and atraumatic.  Right Ear: External ear normal.  Left Ear: External ear normal.  Eyes: Conjunctivae are normal. No scleral icterus.  Neck: Neck supple. Carotid bruit is not present.  Cardiovascular: Normal rate, regular rhythm and normal heart sounds.  Exam reveals no gallop and no friction rub.   No murmur heard. Pulmonary/Chest: Effort normal and breath sounds normal. No respiratory distress. He has no wheezes. no rales.  Lymphadenopathy:    He has no cervical adenopathy.  Neurological:Alert.  Skin: Skin is warm and dry.  Not diaphoretic.  Psychiatric: Has a normal mood and affect.        Assessment & Plan:

## 2011-12-17 NOTE — Patient Instructions (Signed)
Please schedule fasting labs prior to next visit Chem7, a1c-250.0 and lipid/lft-272.4 

## 2011-12-17 NOTE — Assessment & Plan Note (Signed)
Continue statin tx. Obtain lipid/lft prior to next visit 

## 2011-12-17 NOTE — Assessment & Plan Note (Signed)
Normotensive and stable. Continue current regimen. Monitor bp as outpt and followup in clinic as scheduled.  

## 2011-12-17 NOTE — Assessment & Plan Note (Signed)
Attempt levitra 20mg prn. Dosing instructions provided. 

## 2011-12-17 NOTE — Assessment & Plan Note (Signed)
Improving control. Continue current regimen. Obtain chem7 and a1c prior to next visit 

## 2012-01-21 ENCOUNTER — Other Ambulatory Visit: Payer: Self-pay | Admitting: *Deleted

## 2012-01-21 NOTE — Telephone Encounter (Signed)
Received message from pt's wife, Lynden Ang requesting refill of pt's crestor. Upon review of record, refill was sent on 10/10/11 #30 x 6 refills. Rx should still have refills on file. Attempted to reach [facility] and left message on voice mail to check with the pharmacist re: refills on file and to call if any questions after that.

## 2012-02-19 ENCOUNTER — Telehealth: Payer: Self-pay | Admitting: Internal Medicine

## 2012-02-19 MED ORDER — METFORMIN HCL ER 500 MG PO TB24: 1000.0000 mg | ORAL_TABLET | Freq: Two times a day (BID) | ORAL | Status: DC

## 2012-02-19 NOTE — Telephone Encounter (Signed)
Rx refill sent to pharmacy. 

## 2012-02-19 NOTE — Telephone Encounter (Signed)
Refill- metformin er 500mg  tab. Take two tablets by mouth twice daily. Qty 240 last fill 4.18.13

## 2012-04-24 ENCOUNTER — Ambulatory Visit: Payer: BC Managed Care – PPO | Admitting: Family

## 2012-04-30 ENCOUNTER — Encounter: Payer: Self-pay | Admitting: Internal Medicine

## 2012-04-30 ENCOUNTER — Telehealth: Payer: Self-pay | Admitting: Internal Medicine

## 2012-04-30 ENCOUNTER — Ambulatory Visit (INDEPENDENT_AMBULATORY_CARE_PROVIDER_SITE_OTHER): Payer: BC Managed Care – PPO | Admitting: Internal Medicine

## 2012-04-30 VITALS — BP 116/68 | HR 86 | Temp 98.2°F | Resp 16 | Ht 76.0 in | Wt 209.8 lb

## 2012-04-30 DIAGNOSIS — L089 Local infection of the skin and subcutaneous tissue, unspecified: Secondary | ICD-10-CM

## 2012-04-30 DIAGNOSIS — E119 Type 2 diabetes mellitus without complications: Secondary | ICD-10-CM

## 2012-04-30 DIAGNOSIS — N529 Male erectile dysfunction, unspecified: Secondary | ICD-10-CM

## 2012-04-30 DIAGNOSIS — E785 Hyperlipidemia, unspecified: Secondary | ICD-10-CM

## 2012-04-30 MED ORDER — AMOXICILLIN-POT CLAVULANATE 875-125 MG PO TABS: 1.0000 | ORAL_TABLET | Freq: Two times a day (BID) | ORAL | Status: AC

## 2012-04-30 MED ORDER — TADALAFIL 20 MG PO TABS: ORAL_TABLET | ORAL | Status: DC

## 2012-04-30 NOTE — Assessment & Plan Note (Signed)
Attempt augmentin. Followup if no improvement or worsening.

## 2012-04-30 NOTE — Telephone Encounter (Signed)
Lab order 9-6-2013Cbc, chem7, a1c, urine microalbumin-250.0 and lipid/lft-272.4

## 2012-04-30 NOTE — Progress Notes (Signed)
  Subjective:    Patient ID: [name], male    DOB: 06/15/1951, 61 y.o.   MRN: [id number]  HPI Pt presents to clinic for followup of multiple medical problems. Notes spider bite to right hand between 2nd and 3rd fingers one month ago that has note fully healed. Has superficial wound with intermittent drainage. No f/c. Took 3 unspecified abx pills with improvement. Reports fsbs range 74-120. Could not tolerate levitra due to nausea and believes tolerated cialis in the past.   Past Medical History  Diagnosis Date  . History of colonic polyps   . Diabetes mellitus type II   . Hypertension   . Hyperlipidemia   . Low back pain   . Breast mass, right 03/2008  . Lipoma   . Abnormal myocardial perfusion study 2006    EF 44% ? inferoseptal ischemia. no cath done   Past Surgical History  Procedure Date  . Lipoma excision 07/2008     Dr Harlon Flor    reports that he has quit smoking. He has never used smokeless tobacco. He reports that he does not drink alcohol or use illicit drugs. family history includes Hypertension in his mother and Melanoma (age of onset:78) in his father. Allergies  Allergen Reactions  . Amlodipine Besylate     REACTION: ? caused left axillary nodules  . Hydrochlorothiazide       Review of Systems see hpi     Objective:   Physical Exam  Physical Exam  Nursing note and vitals reviewed. Constitutional: Appears well-developed and well-nourished. No distress.  HENT:  Head: Normocephalic and atraumatic.  Right Ear: External ear normal.  Left Ear: External ear normal.  Eyes: Conjunctivae are normal. No scleral icterus.  Neck: Neck supple. Carotid bruit is not present.  Cardiovascular: Normal rate, regular rhythm and normal heart sounds.  Exam reveals no gallop and no friction rub.   No murmur heard. Pulmonary/Chest: Effort normal and breath sounds normal. No respiratory distress. He has no wheezes. no rales.  Lymphadenopathy:    He has no cervical  adenopathy.  Neurological:Alert.  Skin: Skin is warm and dry. Not diaphoretic. right hand b/t 2nd/3rd fingers intertriginous area-shallow 1.5cm wound without current drainage or surrounding erythema  Psychiatric: Has a normal mood and affect.        Assessment & Plan:

## 2012-04-30 NOTE — Telephone Encounter (Signed)
Please schedule fasting labs for tomorrow morning : Cbc, chem7, a1c, urine microalbumin-250.0 and lipid/lft-272.4  Also please schedule chem7, a1c-250.00 prior to your next visit   Future orders entered and given to the lab.

## 2012-04-30 NOTE — Assessment & Plan Note (Signed)
Improved controlled based on fsbs. Obtain cbc, chem7, a1c, and urine microalbumin

## 2012-04-30 NOTE — Patient Instructions (Signed)
Please schedule fasting labs for tomorrow morning Cbc, chem7, a1c, urine microalbumin-250.0 and lipid/lft-272.4 Also please schedule chem7, a1c-250.00 prior to your next visit

## 2012-04-30 NOTE — Assessment & Plan Note (Signed)
Intolerant of levitra. Attempt cialis

## 2012-05-01 ENCOUNTER — Other Ambulatory Visit (INDEPENDENT_AMBULATORY_CARE_PROVIDER_SITE_OTHER): Payer: BC Managed Care – PPO

## 2012-05-01 DIAGNOSIS — IMO0001 Reserved for inherently not codable concepts without codable children: Secondary | ICD-10-CM

## 2012-05-01 DIAGNOSIS — E785 Hyperlipidemia, unspecified: Secondary | ICD-10-CM

## 2012-05-01 LAB — HEPATIC FUNCTION PANEL
ALT: 24 U/L (ref 0–53)
AST: 19 U/L (ref 0–37)
Albumin: 4.1 g/dL (ref 3.5–5.2)
Alkaline Phosphatase: 58 U/L (ref 39–117)

## 2012-05-01 LAB — LIPID PANEL
LDL Cholesterol: 67 mg/dL (ref 0–99)
Total CHOL/HDL Ratio: 3

## 2012-05-01 LAB — CBC WITH DIFFERENTIAL/PLATELET
Basophils Relative: 0.5 % (ref 0.0–3.0)
Eosinophils Absolute: 0.1 10*3/uL (ref 0.0–0.7)
Eosinophils Relative: 2.1 % (ref 0.0–5.0)
HCT: 46.9 % (ref 39.0–52.0)
Hemoglobin: 14.6 g/dL (ref 13.0–17.0)
Lymphs Abs: 2.7 10*3/uL (ref 0.7–4.0)
MCHC: 31.2 g/dL (ref 30.0–36.0)
MCV: 75.9 fl — ABNORMAL LOW (ref 78.0–100.0)
Monocytes Absolute: 0.7 10*3/uL (ref 0.1–1.0)
Neutro Abs: 3.1 10*3/uL (ref 1.4–7.7)
RBC: 6.18 Mil/uL — ABNORMAL HIGH (ref 4.22–5.81)
WBC: 6.6 10*3/uL (ref 4.5–10.5)

## 2012-05-01 LAB — BASIC METABOLIC PANEL
BUN: 11 mg/dL (ref 6–23)
CO2: 24 mEq/L (ref 19–32)
Calcium: 9.1 mg/dL (ref 8.4–10.5)
Creatinine, Ser: 0.8 mg/dL (ref 0.4–1.5)
Glucose, Bld: 185 mg/dL — ABNORMAL HIGH (ref 70–99)

## 2012-05-01 LAB — MICROALBUMIN / CREATININE URINE RATIO: Microalb, Ur: 0.6 mg/dL (ref 0.0–1.9)

## 2012-05-01 NOTE — Addendum Note (Signed)
Addended by: Mervin Kung A on: 05/01/2012 08:53 AM   Modules accepted: Orders

## 2012-05-18 ENCOUNTER — Telehealth: Payer: Self-pay | Admitting: Internal Medicine

## 2012-05-18 MED ORDER — INSULIN GLARGINE 100 UNIT/ML ~~LOC~~ SOLN: 40.0000 [IU] | Freq: Every day | SUBCUTANEOUS | Status: DC

## 2012-05-18 NOTE — Telephone Encounter (Signed)
Refill-lantus solostar inj. Inject 45-55 units subcutaneously at bedtime. Qty 15 last fill 8.17.13

## 2012-05-18 NOTE — Telephone Encounter (Signed)
Rx done/SLS 

## 2012-07-03 ENCOUNTER — Telehealth: Payer: Self-pay | Admitting: Internal Medicine

## 2012-07-03 MED ORDER — METFORMIN HCL ER 500 MG PO TB24: 1000.0000 mg | ORAL_TABLET | Freq: Two times a day (BID) | ORAL | Status: DC

## 2012-07-03 NOTE — Telephone Encounter (Signed)
METFORMIN ER 500 MG TAB QTY 120 TAKE 2 TABLETS BY MOUTH TWQICE DAILY LASDT FILL 05-30-2012

## 2012-07-03 NOTE — Telephone Encounter (Signed)
Rx to pharmacy/[facility] 

## 2012-07-20 ENCOUNTER — Telehealth: Payer: Self-pay | Admitting: Internal Medicine

## 2012-07-20 MED ORDER — NIFEDIPINE ER OSMOTIC RELEASE 90 MG PO TB24: 90.0000 mg | ORAL_TABLET | Freq: Every day | ORAL | Status: DC

## 2012-07-20 NOTE — Telephone Encounter (Signed)
Refill- procardia xl 90mg  tab. Take one tablet by mouth every day. Qty 30 last fill 10.24.13

## 2012-07-20 NOTE — Telephone Encounter (Signed)
Rx to pharmacy/[facility] 

## 2012-08-04 ENCOUNTER — Encounter: Payer: Self-pay | Admitting: Internal Medicine

## 2012-08-04 ENCOUNTER — Telehealth: Payer: Self-pay | Admitting: Internal Medicine

## 2012-08-04 ENCOUNTER — Ambulatory Visit (INDEPENDENT_AMBULATORY_CARE_PROVIDER_SITE_OTHER): Payer: BC Managed Care – PPO | Admitting: Internal Medicine

## 2012-08-04 VITALS — BP 132/76 | HR 86 | Temp 98.1°F | Resp 16 | Ht 76.0 in | Wt 211.1 lb

## 2012-08-04 DIAGNOSIS — E119 Type 2 diabetes mellitus without complications: Secondary | ICD-10-CM

## 2012-08-04 DIAGNOSIS — Z23 Encounter for immunization: Secondary | ICD-10-CM

## 2012-08-04 DIAGNOSIS — E785 Hyperlipidemia, unspecified: Secondary | ICD-10-CM

## 2012-08-04 DIAGNOSIS — I10 Essential (primary) hypertension: Secondary | ICD-10-CM

## 2012-08-04 LAB — LIPID PANEL
Cholesterol: 151 mg/dL (ref 0–200)
LDL Cholesterol: 75 mg/dL (ref 0–99)
Triglycerides: 152 mg/dL — ABNORMAL HIGH (ref <150)
VLDL: 30 mg/dL (ref 0–40)

## 2012-08-04 LAB — HEMOGLOBIN A1C: Hgb A1c MFr Bld: 8.2 % — ABNORMAL HIGH (ref <5.7)

## 2012-08-04 LAB — BASIC METABOLIC PANEL
CO2: 25 mEq/L (ref 19–32)
Calcium: 9.3 mg/dL (ref 8.4–10.5)
Creat: 0.96 mg/dL (ref 0.50–1.35)
Sodium: 140 mEq/L (ref 135–145)

## 2012-08-04 NOTE — Assessment & Plan Note (Signed)
Normotensive and stable. Continue current regimen. Monitor bp as outpt and followup in clinic as scheduled.  

## 2012-08-04 NOTE — Assessment & Plan Note (Signed)
Improved control based on fsbs. Obtain chem7 and a1c.

## 2012-08-04 NOTE — Assessment & Plan Note (Signed)
Compliant with crestor daily. Obtain lipid profile

## 2012-08-04 NOTE — Patient Instructions (Signed)
Please schedule labs prior to next visit Chem7, a1c-250.0 and lipid/lft-272.4

## 2012-08-04 NOTE — Progress Notes (Signed)
  Subjective:    Patient ID: [name], male    DOB: 1950-09-16, 61 y.o.   MRN: [id number]  HPI Pt presents to clinic for followup of multiple medical problems. States fsbs range 80's-120's without hypoglycemia. Improvement began in October. BP reviewed as normotensive. No active complaints.  Past Medical History  Diagnosis Date  . History of colonic polyps   . Diabetes mellitus type II   . Hypertension   . Hyperlipidemia   . Low back pain   . Breast mass, right 03/2008  . Lipoma   . Abnormal myocardial perfusion study 2006    EF 44% ? inferoseptal ischemia. no cath done   Past Surgical History  Procedure Date  . Lipoma excision 07/2008     Dr Harlon Flor    reports that he has quit smoking. He has never used smokeless tobacco. He reports that he does not drink alcohol or use illicit drugs. family history includes Hypertension in his mother and Melanoma (age of onset:78) in his father. Allergies  Allergen Reactions  . Amlodipine Besylate     REACTION: ? caused left axillary nodules  . Hydrochlorothiazide       Review of Systems see hpi     Objective:   Physical Exam  Physical Exam  Nursing note and vitals reviewed. Constitutional: Appears well-developed and well-nourished. No distress.  HENT:  Head: Normocephalic and atraumatic.  Right Ear: External ear normal.  Left Ear: External ear normal.  Eyes: Conjunctivae are normal. No scleral icterus.  Neck: Neck supple. Carotid bruit is not present.  Cardiovascular: Normal rate, regular rhythm and normal heart sounds.  Exam reveals no gallop and no friction rub.   No murmur heard. Pulmonary/Chest: Effort normal and breath sounds normal. No respiratory distress. He has no wheezes. no rales.  Lymphadenopathy:    He has no cervical adenopathy.  Neurological:Alert.  Skin: Skin is warm and dry. Not diaphoretic.  Psychiatric: Has a normal mood and affect.        Assessment & Plan:

## 2012-08-04 NOTE — Telephone Encounter (Signed)
Labs already ordered

## 2012-08-04 NOTE — Telephone Encounter (Signed)
Lab order week of 10-26-2012 Chem7, a1c-250.0 and lipid/lft-272.4

## 2012-08-17 ENCOUNTER — Telehealth: Payer: Self-pay | Admitting: Internal Medicine

## 2012-08-17 DIAGNOSIS — I10 Essential (primary) hypertension: Secondary | ICD-10-CM

## 2012-08-17 DIAGNOSIS — E785 Hyperlipidemia, unspecified: Secondary | ICD-10-CM

## 2012-08-17 MED ORDER — ROSUVASTATIN CALCIUM 20 MG PO TABS: 20.0000 mg | ORAL_TABLET | Freq: Every day | ORAL | Status: DC

## 2012-08-17 MED ORDER — LISINOPRIL 20 MG PO TABS: ORAL_TABLET | ORAL | Status: DC

## 2012-08-17 NOTE — Telephone Encounter (Signed)
Refill crestor 20 mg tab qty 30 take 1 tablet by mouth every day last fill 07-18-2012 Refill lisinopril 20 mg tab qty 60 take 2 tablets by mouth every day last fill 07-10-2012

## 2012-08-17 NOTE — Telephone Encounter (Signed)
Rx[s] to pharmacy/[facility] 

## 2012-11-02 ENCOUNTER — Telehealth: Payer: Self-pay | Admitting: Internal Medicine

## 2012-11-02 MED ORDER — INSULIN PEN NEEDLE 31G X 8 MM MISC: Status: DC

## 2012-11-02 NOTE — Telephone Encounter (Signed)
relion pen needle 31g/26mm mis qty 100 use twice daily for insulin injection

## 2012-11-03 ENCOUNTER — Ambulatory Visit (INDEPENDENT_AMBULATORY_CARE_PROVIDER_SITE_OTHER): Payer: BC Managed Care – PPO | Admitting: Family

## 2012-11-03 ENCOUNTER — Encounter: Payer: Self-pay | Admitting: Family

## 2012-11-03 VITALS — BP 126/86 | HR 85 | Temp 97.8°F | Resp 16 | Ht 76.0 in | Wt 203.0 lb

## 2012-11-03 DIAGNOSIS — E119 Type 2 diabetes mellitus without complications: Secondary | ICD-10-CM

## 2012-11-03 DIAGNOSIS — E785 Hyperlipidemia, unspecified: Secondary | ICD-10-CM

## 2012-11-03 DIAGNOSIS — I10 Essential (primary) hypertension: Secondary | ICD-10-CM

## 2012-11-03 LAB — HEPATIC FUNCTION PANEL
ALT: 16 U/L (ref 0–53)
Bilirubin, Direct: 0.1 mg/dL (ref 0.0–0.3)
Indirect Bilirubin: 0.4 mg/dL (ref 0.0–0.9)
Total Bilirubin: 0.5 mg/dL (ref 0.3–1.2)

## 2012-11-03 LAB — HEMOGLOBIN A1C: Hgb A1c MFr Bld: 8.1 % — ABNORMAL HIGH (ref <5.7)

## 2012-11-03 LAB — BASIC METABOLIC PANEL
BUN: 11 mg/dL (ref 6–23)
Chloride: 105 mEq/L (ref 96–112)
Glucose, Bld: 95 mg/dL (ref 70–99)
Potassium: 4.7 mEq/L (ref 3.5–5.3)
Sodium: 141 mEq/L (ref 135–145)

## 2012-11-03 NOTE — Progress Notes (Signed)
Subjective:    Patient ID: [name], male    DOB: 1950-09-10, 62 y.o.   MRN: [id number]  HPI  [name] is a 62 yr old male who presents today for follow up.  1) HTN- Pt is currently on lisinopril and procardia. Denies ACE, cough, CP, SOB or swelling.  Last  Eye exam >12 months ago.    2) DM2-On lantus and apridra- last A1c was 8.4.  Using lantus 45 units HS.  He uses apidra 15 units AC meal.   Reports fasting sugar this AM was 65 but generally 86-120.   3) Hyperlipidemia- On crestor, last LDL at goal. Denies unusual myalgia.     Review of Systems See HPI  Past Medical History  Diagnosis Date  . History of colonic polyps   . Diabetes mellitus type II   . Hypertension   . Hyperlipidemia   . Low back pain   . Breast mass, right 03/2008  . Lipoma   . Abnormal myocardial perfusion study 2006    EF 44% ? inferoseptal ischemia. no cath done    History   Social History  . Marital Status: Married    Spouse Name: N/A    Number of Children: N/A  . Years of Education: N/A   Occupational History  . Not on file.   Social History Main Topics  . Smoking status: Former Games developer  . Smokeless tobacco: Never Used     Comment: 33 pack year history  . Alcohol Use: No  . Drug Use: No  . Sexually Active: Not on file   Other Topics Concern  . Not on file   Social History Narrative   Occupation: carpenter   Married    Former Smoker -  33 pack year history   Alcohol use-no     Drug use-no              Past Surgical History  Procedure Laterality Date  . Lipoma excision  07/2008     Dr Harlon Flor    Family History  Problem Relation Age of Onset  . Melanoma Father 20    deceased secondary to melanoma  . Hypertension Mother     alive -75    Allergies  Allergen Reactions  . Amlodipine Besylate     REACTION: ? caused left axillary nodules  . Hydrochlorothiazide     Current Outpatient Prescriptions on File Prior to Visit  Medication Sig Dispense Refill  .  aspirin 81 MG tablet Take 81 mg by mouth daily.        Marland Kitchen glucose blood (ONE TOUCH TEST STRIPS) test strip Use to check blood sugar three times  a day as instructed Dx Code 250.00  300 each  11  . insulin glargine (LANTUS SOLOSTAR) 100 UNIT/ML injection Inject 40-55 Units into the skin at bedtime.  18 mL  6  . insulin glulisine (APIDRA SOLOSTAR) 100 UNIT/ML injection Inject 10-20 Units into the skin 3 (three) times daily before meals.  15 mL  6  . Insulin Pen Needle (B-D ULTRAFINE III SHORT PEN) 31G X 8 MM MISC Use two times a day for insulin injection  100 each  4  . lisinopril (PRINIVIL,ZESTRIL) 20 MG tablet Take 2 tablets by mouth once a day.  60 tablet  6  . metFORMIN (GLUCOPHAGE-XR) 500 MG 24 hr tablet Take 2 tablets (1,000 mg total) by mouth 2 (two) times daily.  120 tablet  3  . Multiple Vitamin (MULTIVITAMIN) tablet Take 1  tablet by mouth daily.        Marland Kitchen NIFEdipine (PROCARDIA XL/ADALAT-CC) 90 MG 24 hr tablet Take 1 tablet (90 mg total) by mouth daily.  30 tablet  6  . rosuvastatin (CRESTOR) 20 MG tablet Take 1 tablet (20 mg total) by mouth daily.  30 tablet  6  . tadalafil (CIALIS) 20 MG tablet One by mouth every 36 hours as needed  6 tablet  4  . vardenafil (LEVITRA) 20 MG tablet Take 20 mg by mouth daily as needed.       No current facility-administered medications on file prior to visit.    BP 126/86  Pulse 85  Temp(Src) 97.8 F (36.6 C) (Oral)  Resp 16  Ht 6\' 4"  (1.93 m)  Wt 203 lb (92.08 kg)  BMI 24.72 kg/m2  SpO2 97%       Objective:   Physical Exam  Constitutional: He is oriented to person, place, and time. He appears well-developed and well-nourished. No distress.  HENT:  Head: Normocephalic and atraumatic.  Cardiovascular: Normal rate and regular rhythm.   No murmur heard. Pulmonary/Chest: Effort normal and breath sounds normal. No respiratory distress. He has no wheezes. He has no rales. He exhibits no tenderness.  Neurological: He is alert and oriented to  person, place, and time.  Psychiatric: He has a normal mood and affect. His behavior is normal. Judgment and thought content normal.          Assessment & Plan:

## 2012-11-03 NOTE — Patient Instructions (Addendum)
Please schedule a follow up appointment in 3 months. Complete lab work prior to leaving.  Don't forget to take your lunchtime dose of apidra.   Please schedule a follow up eye exam with your eye doctor.

## 2012-11-03 NOTE — Assessment & Plan Note (Signed)
LDL at goal on crestor, cont same, obtain lft.

## 2012-11-03 NOTE — Assessment & Plan Note (Signed)
BP stable on current meds. Continue same.  

## 2012-11-03 NOTE — Assessment & Plan Note (Signed)
Overall reports good control.  Suspect post prandial hyperglycemia after lunch due to poor compliance with apidra dosing while at work.  Reinforced importance of compliance with this dose.

## 2012-11-05 ENCOUNTER — Encounter: Payer: Self-pay | Admitting: Family

## 2012-11-26 ENCOUNTER — Other Ambulatory Visit: Payer: Self-pay

## 2012-11-26 MED ORDER — METFORMIN HCL ER 500 MG PO TB24: 1000.0000 mg | ORAL_TABLET | Freq: Two times a day (BID) | ORAL | Status: DC

## 2012-12-09 ENCOUNTER — Telehealth: Payer: Self-pay | Admitting: Internal Medicine

## 2012-12-09 MED ORDER — INSULIN GLULISINE 100 UNIT/ML ~~LOC~~ SOLN: 10.0000 [IU] | Freq: Three times a day (TID) | SUBCUTANEOUS | Status: DC

## 2012-12-09 NOTE — Telephone Encounter (Signed)
Refill- apidra solostar inj. Inject 10-20 units into the skin three times daily before meals. Qty 15 last fill 2.4.14

## 2013-01-06 ENCOUNTER — Other Ambulatory Visit: Payer: Self-pay

## 2013-01-06 DIAGNOSIS — E785 Hyperlipidemia, unspecified: Secondary | ICD-10-CM

## 2013-01-06 MED ORDER — ROSUVASTATIN CALCIUM 20 MG PO TABS: 20.0000 mg | ORAL_TABLET | Freq: Every day | ORAL | Status: DC

## 2013-01-13 ENCOUNTER — Other Ambulatory Visit: Payer: Self-pay

## 2013-01-13 MED ORDER — INSULIN GLARGINE 100 UNIT/ML ~~LOC~~ SOLN: 40.0000 [IU] | Freq: Every day | SUBCUTANEOUS | Status: DC

## 2013-01-29 ENCOUNTER — Ambulatory Visit (INDEPENDENT_AMBULATORY_CARE_PROVIDER_SITE_OTHER): Payer: BC Managed Care – PPO | Admitting: Family Medicine

## 2013-01-29 ENCOUNTER — Encounter: Payer: Self-pay | Admitting: Family Medicine

## 2013-01-29 VITALS — BP 142/82 | HR 82 | Temp 98.9°F | Ht 76.0 in | Wt 203.1 lb

## 2013-01-29 DIAGNOSIS — L259 Unspecified contact dermatitis, unspecified cause: Secondary | ICD-10-CM

## 2013-01-29 DIAGNOSIS — L237 Allergic contact dermatitis due to plants, except food: Secondary | ICD-10-CM

## 2013-01-29 DIAGNOSIS — L255 Unspecified contact dermatitis due to plants, except food: Secondary | ICD-10-CM

## 2013-01-29 DIAGNOSIS — I10 Essential (primary) hypertension: Secondary | ICD-10-CM

## 2013-01-29 DIAGNOSIS — T7840XA Allergy, unspecified, initial encounter: Secondary | ICD-10-CM

## 2013-01-29 DIAGNOSIS — E119 Type 2 diabetes mellitus without complications: Secondary | ICD-10-CM

## 2013-01-29 MED ORDER — MUPIROCIN 2 % EX OINT: TOPICAL_OINTMENT | Freq: Two times a day (BID) | CUTANEOUS | Status: DC

## 2013-01-29 MED ORDER — INSULIN ASPART 100 UNIT/ML ~~LOC~~ SOLN: SUBCUTANEOUS | Status: DC

## 2013-01-29 MED ORDER — CETIRIZINE HCL 10 MG PO TABS: ORAL_TABLET | ORAL | Status: DC

## 2013-01-29 MED ORDER — METHYLPREDNISOLONE ACETATE 40 MG/ML IJ SUSP
40.0000 mg | Freq: Once | INTRAMUSCULAR | Status: AC
  Administered 2013-01-29: 40 mg via INTRAMUSCULAR

## 2013-01-29 NOTE — Patient Instructions (Addendum)
  Cleanse area with mild soap and then [employer] Watch the carbs the next 24 hours  Labs prior to visit, lipid, renal, cbc, tsh, hepatic, hgba1c  Poison [facility] ivy is a inflammation of the skin (contact dermatitis) caused by touching the allergens on the leaves of the ivy plant following previous exposure to the plant. The rash usually appears 48 hours after exposure. The rash is usually bumps (papules) or blisters (vesicles) in a linear pattern. Depending on your own sensitivity, the rash may simply cause redness and itching, or it may also progress to blisters which may break open. These must be well cared for to prevent secondary bacterial (germ) infection, followed by scarring. Keep any open areas dry, clean, dressed, and covered with an antibacterial ointment if needed. The eyes may also get puffy. The puffiness is worst in the morning and gets better as the day progresses. This dermatitis usually heals without scarring, within 2 to 3 weeks without treatment. HOME CARE INSTRUCTIONS  Thoroughly wash with soap and water as soon as you have been exposed to poison ivy. You have about one half hour to remove the plant resin before it will cause the rash. This washing will destroy the oil or antigen on the skin that is causing, or will cause, the rash. Be sure to wash under your fingernails as any plant resin there will continue to spread the rash. Do not rub skin vigorously when washing affected area. Poison ivy cannot spread if no oil from the plant remains on your body. A rash that has progressed to weeping sores will not spread the rash unless you have not washed thoroughly. It is also important to wash any clothes you have been wearing as these may carry active allergens. The rash will return if you wear the unwashed clothing, even several days later. Avoidance of the plant in the future is the best measure. Poison ivy plant can be recognized by the number of leaves. Generally, poison  ivy has three leaves with flowering branches on a single stem. Diphenhydramine may be purchased over the counter and used as needed for itching. Do not drive with this medication if it makes you drowsy.Ask your caregiver about medication for children. SEEK MEDICAL CARE IF:  Open sores develop.  Redness spreads beyond area of rash.  You notice purulent (pus-like) discharge.  You have increased pain.  Other signs of infection develop (such as fever). Document Released: 08/09/2000 Document Revised: 11/04/2011 Document Reviewed: 06/28/2009 [facility] Patient Information 2014 [place], [place].

## 2013-01-31 ENCOUNTER — Encounter: Payer: Self-pay | Admitting: Family Medicine

## 2013-01-31 DIAGNOSIS — L259 Unspecified contact dermatitis, unspecified cause: Secondary | ICD-10-CM | POA: Insufficient documentation

## 2013-01-31 HISTORY — DX: Unspecified contact dermatitis, unspecified cause: L25.9

## 2013-01-31 NOTE — Assessment & Plan Note (Signed)
Well controlled on current meds no changes 

## 2013-01-31 NOTE — Assessment & Plan Note (Signed)
With some mild secondary impetigo. Encouraged cleanse daily with mild soap and Witch Hazel then apply antibiotic ointment. Given a shot of Depo medrol today and start Zyrtec daily.

## 2013-01-31 NOTE — Progress Notes (Signed)
Patient ID: [name], male   DOB: September 29, 1950, 62 y.o.   MRN: [id number] [name] [id number] 07-31-1951 01/31/2013      Progress Note-Follow Up  Subjective  Chief Complaint  Chief Complaint  Patient presents with  . Poison Ivy    L arm not healing    HPI  Patient is an 62 year old after American male who is in today with a rash on his arm for the past several weeks. He believes he got poison ivy home and initially a rash on his right arm. That resolved but now rash appeared on his left arm. There is itching and irritation. She's tried calamine lotion and Cortaid with no results. No fevers or chills. No malaise or myalgias. He denies any recent trouble with his blood sugar or any other acute illness. No chest pain, palpitations, shortness of breath or  Past Medical History  Diagnosis Date  . History of colonic polyps   . Diabetes mellitus type II   . Hypertension   . Hyperlipidemia   . Low back pain   . Breast mass, right 03/2008  . Lipoma   . Abnormal myocardial perfusion study 2006    EF 44% ? inferoseptal ischemia. no cath done  . Contact dermatitis 01/31/2013    Past Surgical History  Procedure Laterality Date  . Lipoma excision  07/2008     Dr Harlon Flor    Family History  Problem Relation Age of Onset  . Melanoma Father 97    deceased secondary to melanoma  . Hypertension Mother     alive -42    History   Social History  . Marital Status: Married    Spouse Name: N/A    Number of Children: N/A  . Years of Education: N/A   Occupational History  . Not on file.   Social History Main Topics  . Smoking status: Former Games developer  . Smokeless tobacco: Never Used     Comment: 33 pack year history  . Alcohol Use: No  . Drug Use: No  . Sexually Active: Not on file   Other Topics Concern  . Not on file   Social History Narrative   Occupation: carpenter   Married    Former Smoker -  33 pack year history   Alcohol use-no     Drug use-no           Current Outpatient Prescriptions on File Prior to Visit  Medication Sig Dispense Refill  . aspirin 81 MG tablet Take 81 mg by mouth daily.        Marland Kitchen glucose blood (ONE TOUCH TEST STRIPS) test strip Use to check blood sugar three times  a day as instructed Dx Code 250.00  300 each  11  . insulin glargine (LANTUS) 100 UNIT/ML injection Inject 0.4-0.55 mLs (40-55 Units total) into the skin at bedtime.  18 mL  6  . Insulin Pen Needle (B-D ULTRAFINE III SHORT PEN) 31G X 8 MM MISC Use two times a day for insulin injection  100 each  4  . lisinopril (PRINIVIL,ZESTRIL) 20 MG tablet Take 2 tablets by mouth once a day.  60 tablet  6  . metFORMIN (GLUCOPHAGE-XR) 500 MG 24 hr tablet Take 2 tablets (1,000 mg total) by mouth 2 (two) times daily.  180 tablet  1  . Multiple Vitamin (MULTIVITAMIN) tablet Take 1 tablet by mouth daily.        Marland Kitchen NIFEdipine (PROCARDIA XL/ADALAT-CC) 90 MG 24 hr tablet Take 1 tablet (  90 mg total) by mouth daily.  30 tablet  6  . rosuvastatin (CRESTOR) 20 MG tablet Take 1 tablet (20 mg total) by mouth daily.  30 tablet  6  . tadalafil (CIALIS) 20 MG tablet One by mouth every 36 hours as needed  6 tablet  4  . vardenafil (LEVITRA) 20 MG tablet Take 20 mg by mouth daily as needed.       No current facility-administered medications on file prior to visit.    Allergies  Allergen Reactions  . Amlodipine Besylate     REACTION: ? caused left axillary nodules  . Hydrochlorothiazide     Review of Systems  Review of Systems  Constitutional: Negative for fever and malaise/fatigue.  HENT: Negative for congestion.   Eyes: Negative for discharge.  Respiratory: Negative for shortness of breath.   Cardiovascular: Negative for chest pain, palpitations and leg swelling.  Gastrointestinal: Negative for nausea, abdominal pain and diarrhea.  Genitourinary: Negative for dysuria.  Musculoskeletal: Negative for falls.  Skin: Positive for itching and rash.  Neurological: Negative for loss  of consciousness and headaches.  Endo/Heme/Allergies: Negative for polydipsia.  Psychiatric/Behavioral: Negative for depression and suicidal ideas. The patient is not nervous/anxious and does not have insomnia.     Objective  BP 142/82  Pulse 82  Temp(Src) 98.9 F (37.2 C) (Oral)  Ht 6\' 4"  (1.93 m)  Wt 203 lb 1.9 oz (92.135 kg)  BMI 24.73 kg/m2  SpO2 97%  Physical Exam  Physical Exam  Constitutional: He is oriented to person, place, and time and well-developed, well-nourished, and in no distress. No distress.  HENT:  Head: Normocephalic and atraumatic.  Eyes: Conjunctivae are normal.  Neck: Neck supple. No thyromegaly present.  Cardiovascular: Normal rate, regular rhythm and normal heart sounds.   No murmur heard. Pulmonary/Chest: Effort normal and breath sounds normal. No respiratory distress.  Abdominal: He exhibits no distension and no mass. There is no tenderness.  Musculoskeletal: He exhibits no edema.  Neurological: He is alert and oriented to person, place, and time.  Skin: Skin is warm. Rash noted.  Erythematous patch on left arm with vesicles on top and some yellow scabbing as well  Psychiatric: Memory, affect and judgment normal.    Lab Results  Component Value Date   TSH 0.60 03/03/2009   Lab Results  Component Value Date   WBC 6.6 05/01/2012   HGB 14.6 05/01/2012   HCT 46.9 05/01/2012   MCV 75.9* 05/01/2012   PLT 307.0 05/01/2012   Lab Results  Component Value Date   CREATININE 0.81 11/03/2012   BUN 11 11/03/2012   NA 141 11/03/2012   K 4.7 11/03/2012   CL 105 11/03/2012   CO2 22 11/03/2012   Lab Results  Component Value Date   ALT 16 11/03/2012   AST 16 11/03/2012   ALKPHOS 52 11/03/2012   BILITOT 0.5 11/03/2012   Lab Results  Component Value Date   CHOL 151 08/04/2012   Lab Results  Component Value Date   HDL 46 08/04/2012   Lab Results  Component Value Date   LDLCALC 75 08/04/2012   Lab Results  Component Value Date   TRIG 152* 08/04/2012   Lab  Results  Component Value Date   CHOLHDL 3.3 08/04/2012     Assessment & Plan  HYPERTENSION Well controlled on current meds no changes  Diabetes mellitus, type 2 Patient reports good control of sugars, agrees to return for labs and further consideration   Contact dermatitis With  some mild secondary impetigo. Encouraged cleanse daily with mild soap and Witch Hazel then apply antibiotic ointment. Given a shot of Depo medrol today and start Zyrtec daily.

## 2013-01-31 NOTE — Assessment & Plan Note (Signed)
Patient reports good control of sugars, agrees to return for labs and further consideration

## 2013-02-02 ENCOUNTER — Encounter: Payer: Self-pay | Admitting: Family

## 2013-02-02 ENCOUNTER — Ambulatory Visit (INDEPENDENT_AMBULATORY_CARE_PROVIDER_SITE_OTHER): Payer: BC Managed Care – PPO | Admitting: Family

## 2013-02-02 VITALS — BP 138/90 | HR 80 | Temp 97.7°F | Resp 16 | Ht 76.0 in | Wt 203.0 lb

## 2013-02-02 DIAGNOSIS — E785 Hyperlipidemia, unspecified: Secondary | ICD-10-CM

## 2013-02-02 DIAGNOSIS — L259 Unspecified contact dermatitis, unspecified cause: Secondary | ICD-10-CM

## 2013-02-02 DIAGNOSIS — E119 Type 2 diabetes mellitus without complications: Secondary | ICD-10-CM

## 2013-02-02 NOTE — Patient Instructions (Addendum)
Please call if poison ivy worsens, or if it does not continue to improve in the next 1 week.   Please follow up in 3 months.   Come to the lab fasting 1 week prior to your appointment for lab work.

## 2013-02-02 NOTE — Progress Notes (Signed)
  Subjective:    Patient ID: [name], male    DOB: August 14, 1951, 62 y.o.   MRN: [id number]  HPI  Mr.  Keimig is a 62 yr old male who presents today for follow up of his poison ivy.   He reports that he has been cleansing the area and taking zyrtec Received IM medrol dose on 6/6.  Reports that the area is feeling better and is less inflamed.  Review of Systems    see HPI Objective:   Physical Exam  Constitutional: He appears well-developed and well-nourished. No distress.  Skin:  Cluster of small blisters noted on left forearm.  No significant swelling or erythema is noted.           Assessment & Plan:

## 2013-02-02 NOTE — Assessment & Plan Note (Signed)
Improving. Continue zyrtec and local skin care. Pt instructed to call if symptoms worsen or if symptoms do not continue to improve.

## 2013-02-17 ENCOUNTER — Telehealth: Payer: Self-pay | Admitting: Family Medicine

## 2013-02-17 MED ORDER — NIFEDIPINE ER OSMOTIC RELEASE 90 MG PO TB24: 90.0000 mg | ORAL_TABLET | Freq: Every day | ORAL | Status: DC

## 2013-02-17 NOTE — Telephone Encounter (Signed)
Patients wife called in requesting a refill of nifedipine to be sent to [facility] on [address]

## 2013-03-08 ENCOUNTER — Other Ambulatory Visit: Payer: Self-pay | Admitting: *Deleted

## 2013-03-08 MED ORDER — METFORMIN HCL ER 500 MG PO TB24: 1000.0000 mg | ORAL_TABLET | Freq: Two times a day (BID) | ORAL | Status: DC

## 2013-03-08 NOTE — Telephone Encounter (Signed)
Rx request to pharmacy/[facility]  

## 2013-04-02 ENCOUNTER — Other Ambulatory Visit: Payer: Self-pay

## 2013-04-02 DIAGNOSIS — IMO0001 Reserved for inherently not codable concepts without codable children: Secondary | ICD-10-CM

## 2013-04-02 MED ORDER — GLUCOSE BLOOD VI STRP: ORAL_STRIP | Status: DC

## 2013-05-07 ENCOUNTER — Encounter: Payer: Self-pay | Admitting: Family

## 2013-05-07 ENCOUNTER — Ambulatory Visit (INDEPENDENT_AMBULATORY_CARE_PROVIDER_SITE_OTHER): Payer: BC Managed Care – PPO | Admitting: Family

## 2013-05-07 VITALS — BP 138/89 | HR 74 | Temp 97.8°F | Resp 18 | Ht 76.0 in

## 2013-05-07 DIAGNOSIS — I10 Essential (primary) hypertension: Secondary | ICD-10-CM

## 2013-05-07 DIAGNOSIS — E119 Type 2 diabetes mellitus without complications: Secondary | ICD-10-CM

## 2013-05-07 DIAGNOSIS — Z23 Encounter for immunization: Secondary | ICD-10-CM

## 2013-05-07 DIAGNOSIS — E785 Hyperlipidemia, unspecified: Secondary | ICD-10-CM

## 2013-05-07 LAB — BASIC METABOLIC PANEL
Calcium: 9.2 mg/dL (ref 8.4–10.5)
Creat: 0.82 mg/dL (ref 0.50–1.35)
Glucose, Bld: 101 mg/dL — ABNORMAL HIGH (ref 70–99)
Sodium: 139 mEq/L (ref 135–145)

## 2013-05-07 LAB — HEMOGLOBIN A1C
Hgb A1c MFr Bld: 8 % — ABNORMAL HIGH (ref <5.7)
Mean Plasma Glucose: 183 mg/dL — ABNORMAL HIGH (ref <117)

## 2013-05-07 LAB — HEPATIC FUNCTION PANEL
Albumin: 4.2 g/dL (ref 3.5–5.2)
Total Bilirubin: 0.5 mg/dL (ref 0.3–1.2)
Total Protein: 6.9 g/dL (ref 6.0–8.3)

## 2013-05-07 LAB — LIPID PANEL
Cholesterol: 129 mg/dL (ref 0–200)
Triglycerides: 64 mg/dL (ref <150)
VLDL: 13 mg/dL (ref 0–40)

## 2013-05-07 LAB — TSH: TSH: 0.82 u[IU]/mL (ref 0.350–4.500)

## 2013-05-07 NOTE — Assessment & Plan Note (Addendum)
Fair BP control per pt report. Will obtain A1C/urine microalbumin.  Sample novolog flex pen provided today.  Pt will make appointment for eye exam.  Flu shot given today.

## 2013-05-07 NOTE — Patient Instructions (Addendum)
Please call your eye Doctor and schedule an appointment.  Complete lab work prior to leaving.  Follow up in 3 months.

## 2013-05-07 NOTE — Assessment & Plan Note (Signed)
BP Readings from Last 3 Encounters:  05/07/13 138/89  02/02/13 138/90  01/29/13 142/82   BP stable on ACE, continue same.

## 2013-05-07 NOTE — Progress Notes (Signed)
Subjective:    Patient ID: [name], male    DOB: November 12, 1950, 62 y.o.   MRN: [id number]  HPI  [name] is a 62 yr old male who presents today for follow up of multiple medical problems:  1) DM2- he is currently maintained on metformin, lantus and novolog.   Last A1C in March was elevated at 8.5. Reports that his sugar is averaging around 102.   He is using lantus 45 units at bedtime. Novolog 15 units before 2 meals- reports small breakfast so he does not take before breakfast.  Uses novolog 15 AC lunch and dinner.  Reports that he feels jittery.  Reports that he rarely has hypoglycemia.  Usually attributed to poor intake at a meal.  Reports that his last eye exam was 2 years ago.    2) HTN- Currently maintained on lisinopril.    3) Hyperlipidemia-  Currently maintained on crestor.  LDL was stable and at goal in December- at 54. He reports some leg cramping at night at the end of the day after he has been active on his feet. Denies myalgia.      Review of Systems See HPI  Past Medical History  Diagnosis Date  . History of colonic polyps   . Diabetes mellitus type II   . Hypertension   . Hyperlipidemia   . Low back pain   . Breast mass, right 03/2008  . Lipoma   . Abnormal myocardial perfusion study 2006    EF 44% ? inferoseptal ischemia. no cath done  . Contact dermatitis 01/31/2013    History   Social History  . Marital Status: Married    Spouse Name: N/A    Number of Children: N/A  . Years of Education: N/A   Occupational History  . Not on file.   Social History Main Topics  . Smoking status: Former Games developer  . Smokeless tobacco: Never Used     Comment: 33 pack year history  . Alcohol Use: No  . Drug Use: No  . Sexual Activity: Not on file   Other Topics Concern  . Not on file   Social History Narrative   Occupation: carpenter   Married    Former Smoker -  33 pack year history   Alcohol use-no     Drug use-no              Past Surgical  History  Procedure Laterality Date  . Lipoma excision  07/2008     Dr Harlon Flor    Family History  Problem Relation Age of Onset  . Melanoma Father 73    deceased secondary to melanoma  . Hypertension Mother     alive -38    Allergies  Allergen Reactions  . Amlodipine Besylate     REACTION: ? caused left axillary nodules  . Hydrochlorothiazide     Current Outpatient Prescriptions on File Prior to Visit  Medication Sig Dispense Refill  . aspirin 81 MG tablet Take 81 mg by mouth daily.        Marland Kitchen glucose blood (ONE TOUCH TEST STRIPS) test strip Use to check blood sugar three times  a day as instructed Dx Code 250.00  300 each  11  . insulin aspart (NOVOLOG) 100 UNIT/ML injection 10-30 units SQ TID  3 vial  3  . insulin glargine (LANTUS) 100 UNIT/ML injection Inject 0.4-0.55 mLs (40-55 Units total) into the skin at bedtime.  18 mL  6  . Insulin Pen Needle (B-D  ULTRAFINE III SHORT PEN) 31G X 8 MM MISC Use two times a day for insulin injection  100 each  4  . lisinopril (PRINIVIL,ZESTRIL) 20 MG tablet Take 2 tablets by mouth once a day.  60 tablet  6  . metFORMIN (GLUCOPHAGE-XR) 500 MG 24 hr tablet Take 2 tablets (1,000 mg total) by mouth 2 (two) times daily.  120 tablet  3  . Multiple Vitamin (MULTIVITAMIN) tablet Take 1 tablet by mouth daily.        Marland Kitchen NIFEdipine (PROCARDIA XL/ADALAT-CC) 90 MG 24 hr tablet Take 1 tablet (90 mg total) by mouth daily.  30 tablet  6  . rosuvastatin (CRESTOR) 20 MG tablet Take 1 tablet (20 mg total) by mouth daily.  30 tablet  6  . tadalafil (CIALIS) 20 MG tablet One by mouth every 36 hours as needed  6 tablet  4   No current facility-administered medications on file prior to visit.    Ht 6\' 4"  (1.93 m)       Objective:   Physical Exam  Constitutional: He is oriented to person, place, and time. He appears well-developed and well-nourished.  HENT:  Head: Normocephalic and atraumatic.  Cardiovascular: Normal rate and regular rhythm.   No murmur  heard. Pulmonary/Chest: Effort normal and breath sounds normal. No respiratory distress. He has no wheezes. He has no rales. He exhibits no tenderness.  Lymphadenopathy:    He has no cervical adenopathy.  Neurological: He is alert and oriented to person, place, and time.  Psychiatric: He has a normal mood and affect. His behavior is normal. Judgment and thought content normal.          Assessment & Plan:

## 2013-05-07 NOTE — Assessment & Plan Note (Signed)
Obtain lipid panel and LFT.

## 2013-05-08 LAB — MICROALBUMIN / CREATININE URINE RATIO
Creatinine, Urine: 137.1 mg/dL
Microalb Creat Ratio: 5.9 mg/g (ref 0.0–30.0)

## 2013-05-10 ENCOUNTER — Telehealth: Payer: Self-pay | Admitting: Family

## 2013-05-10 NOTE — Telephone Encounter (Signed)
Left message on home # to return my call. 

## 2013-05-10 NOTE — Telephone Encounter (Signed)
a1c is above goal.  As we discussed at visit pt needs to take apidra [facility] lunch. Hopefully this will help bring down his sugars.  Cholesterol looks good, Liver function, thyroid function, kidney- all look good.

## 2013-05-12 NOTE — Telephone Encounter (Signed)
Notified pt and he voices understanding. 

## 2013-05-13 ENCOUNTER — Telehealth: Payer: Self-pay | Admitting: *Deleted

## 2013-05-13 NOTE — Telephone Encounter (Signed)
Received prior auth for pt's Apidra. I do not see that rx has been sent for Apidra (not currently on pt's med list). Pt states he prefers novolog flex pen and has not been on Apidra? Last phone note advised pt to continue apidra?. Please advise what pt should be on?

## 2013-05-14 MED ORDER — INSULIN ASPART 100 UNIT/ML FLEXPEN: 15.0000 [IU] | PEN_INJECTOR | Freq: Two times a day (BID) | SUBCUTANEOUS | Status: DC

## 2013-05-14 NOTE — Telephone Encounter (Signed)
Lets continue novolog flex pen please.

## 2013-05-14 NOTE — Telephone Encounter (Signed)
Please send rx for novolog flex pen 15 units AC lunch and dinner, dispense 1 month supply with 3 refills

## 2013-05-14 NOTE — Telephone Encounter (Signed)
Refill sent per below instructions.

## 2013-05-14 NOTE — Telephone Encounter (Signed)
Notified pt. He states he currently has novolog vials but would like rx for the flexpen.  Also states that he is using novolog 15 units at lunch and 15 units at supper. Current med list says 10-30 units three times daily.  Please advise how pt should be taking medication?

## 2013-05-26 ENCOUNTER — Other Ambulatory Visit: Payer: Self-pay | Admitting: *Deleted

## 2013-05-26 DIAGNOSIS — I10 Essential (primary) hypertension: Secondary | ICD-10-CM

## 2013-05-26 MED ORDER — LISINOPRIL 20 MG PO TABS: ORAL_TABLET | ORAL | Status: DC

## 2013-05-26 NOTE — Telephone Encounter (Signed)
Rx request to pharmacy/[facility]  

## 2013-07-19 ENCOUNTER — Other Ambulatory Visit: Payer: Self-pay | Admitting: Family Medicine

## 2013-08-10 ENCOUNTER — Ambulatory Visit: Payer: BC Managed Care – PPO | Admitting: Family Medicine

## 2013-08-10 DIAGNOSIS — Z0289 Encounter for other administrative examinations: Secondary | ICD-10-CM

## 2013-08-11 DIAGNOSIS — Z789 Other specified health status: Secondary | ICD-10-CM | POA: Insufficient documentation

## 2013-08-23 ENCOUNTER — Ambulatory Visit (INDEPENDENT_AMBULATORY_CARE_PROVIDER_SITE_OTHER): Payer: BC Managed Care – PPO | Admitting: Physician Assistant

## 2013-08-23 ENCOUNTER — Encounter: Payer: Self-pay | Admitting: Physician Assistant

## 2013-08-23 VITALS — BP 118/84 | HR 95 | Temp 98.9°F | Resp 18 | Ht 76.0 in | Wt 197.0 lb

## 2013-08-23 DIAGNOSIS — L0100 Impetigo, unspecified: Secondary | ICD-10-CM

## 2013-08-23 DIAGNOSIS — L739 Follicular disorder, unspecified: Secondary | ICD-10-CM

## 2013-08-23 DIAGNOSIS — L738 Other specified follicular disorders: Secondary | ICD-10-CM

## 2013-08-23 DIAGNOSIS — H1033 Unspecified acute conjunctivitis, bilateral: Secondary | ICD-10-CM

## 2013-08-23 DIAGNOSIS — H10029 Other mucopurulent conjunctivitis, unspecified eye: Secondary | ICD-10-CM

## 2013-08-23 MED ORDER — ERYTHROMYCIN 5 MG/GM OP OINT: 1.0000 "application " | TOPICAL_OINTMENT | Freq: Three times a day (TID) | OPHTHALMIC | Status: DC

## 2013-08-23 MED ORDER — SULFAMETHOXAZOLE-TMP DS 800-160 MG PO TABS: 1.0000 | ORAL_TABLET | Freq: Two times a day (BID) | ORAL | Status: DC

## 2013-08-23 NOTE — Progress Notes (Signed)
Pre visit review using our clinic review tool, if applicable. No additional management support is needed unless otherwise documented below in the visit note/SLS  

## 2013-08-23 NOTE — Patient Instructions (Signed)
Please take medications as prescribed.  Continue cleaning area.  Avoid rubbing eyes.  Cool compresses to eyes.  Hydrocortisone cream to facial rash.  Please return if symptoms not improving.

## 2013-08-27 DIAGNOSIS — L0100 Impetigo, unspecified: Secondary | ICD-10-CM | POA: Insufficient documentation

## 2013-08-27 DIAGNOSIS — L739 Follicular disorder, unspecified: Secondary | ICD-10-CM | POA: Insufficient documentation

## 2013-08-27 DIAGNOSIS — H1033 Unspecified acute conjunctivitis, bilateral: Secondary | ICD-10-CM | POA: Insufficient documentation

## 2013-08-27 NOTE — Assessment & Plan Note (Signed)
Rx erythromycin ophthalmic ointment to apply to both eyes. Call or return to clinic if symptoms not improving or if vision changes occur.

## 2013-08-27 NOTE — Assessment & Plan Note (Signed)
Rx Bactrim. Discussed appropriate hygiene measures with patient. Patient to return if symptoms not improving.

## 2013-08-27 NOTE — Assessment & Plan Note (Signed)
Due to comorbid folliculitis, will treat with oral antibiotic. Rx Bactrim. Discussed appropriate hygiene measures with patient. Patient to call or return to clinic if symptoms not improving.

## 2013-08-27 NOTE — Progress Notes (Signed)
Patient ID: [name], male   DOB: 1951-04-08, 63 y.o.   MRN: [id number]  Patient presents to clinic today complaining of a rash on chin and neck with drainage and itching that has been present for one week. Patient denies trauma or injury to face. Denies known contact with similar labs. Denies fever, chills, malaise or fatigue. Patient also complains of red and swollen eyes bilaterally, with purulent drainage. Patient denies photophobia or vision changes.  Patient feels that his symptoms are worsening.   Past Medical History  Diagnosis Date  . History of colonic polyps   . Diabetes mellitus type II   . Hypertension   . Hyperlipidemia   . Low back pain   . Breast mass, right 03/2008  . Lipoma   . Abnormal myocardial perfusion study 2006    EF 44% ? inferoseptal ischemia. no cath done  . Contact dermatitis 01/31/2013    Current Outpatient Prescriptions on File Prior to Visit  Medication Sig Dispense Refill  . aspirin 81 MG tablet Take 81 mg by mouth daily.        Marland Kitchen glucose blood (ONE TOUCH TEST STRIPS) test strip Use to check blood sugar three times  a day as instructed Dx Code 250.00  300 each  11  . insulin aspart (NOVOLOG FLEXPEN) 100 UNIT/ML SOPN FlexPen Inject 15 Units into the skin 2 (two) times daily.  9 mL  3  . insulin glargine (LANTUS) 100 UNIT/ML injection Inject 0.4-0.55 mLs (40-55 Units total) into the skin at bedtime.  18 mL  6  . Insulin Pen Needle (B-D ULTRAFINE III SHORT PEN) 31G X 8 MM MISC Use two times a day for insulin injection  100 each  4  . lisinopril (PRINIVIL,ZESTRIL) 20 MG tablet Take 2 tablets by mouth once a day.  60 tablet  2  . metFORMIN (GLUCOPHAGE-XR) 500 MG 24 hr tablet TAKE TWO TABLETS BY MOUTH TWICE DAILY  120 tablet  0  . Multiple Vitamin (MULTIVITAMIN) tablet Take 1 tablet by mouth daily.        Marland Kitchen NIFEdipine (PROCARDIA XL/ADALAT-CC) 90 MG 24 hr tablet Take 1 tablet (90 mg total) by mouth daily.  30 tablet  6  . rosuvastatin (CRESTOR) 20 MG  tablet Take 1 tablet (20 mg total) by mouth daily.  30 tablet  6  . tadalafil (CIALIS) 20 MG tablet One by mouth every 36 hours as needed  6 tablet  4   No current facility-administered medications on file prior to visit.    Allergies  Allergen Reactions  . Amlodipine Besylate     REACTION: ? caused left axillary nodules  . Hydrochlorothiazide     Family History  Problem Relation Age of Onset  . Melanoma Father 38    deceased secondary to melanoma  . Hypertension Mother     alive -30    History   Social History  . Marital Status: Married    Spouse Name: N/A    Number of Children: N/A  . Years of Education: N/A   Social History Main Topics  . Smoking status: Former Research scientist (life sciences)  . Smokeless tobacco: Never Used     Comment: 33 pack year history  . Alcohol Use: No  . Drug Use: No  . Sexual Activity: None   Other Topics Concern  . None   Social History Narrative   Occupation: Games developer   Married    Former Smoker -  33 pack year history   Alcohol use-no  Drug use-no             Review of Systems - See HPI.  All other ROS are negative.  Filed Vitals:   08/23/13 0926  BP: 118/84  Pulse: 95  Temp: 98.9 F (37.2 C)  Resp: 18   Physical Exam  Vitals reviewed. Constitutional: He is oriented to person, place, and time and well-developed, well-nourished, and in no distress.  HENT:  Head: Normocephalic and atraumatic.  Right Ear: External ear normal.  Left Ear: External ear normal.  Nose: Nose normal.  Mouth/Throat: Oropharynx is clear and moist. No oropharyngeal exudate.  Tympanic membranes within normal limits bilaterally.  Eyes: Pupils are equal, round, and reactive to light. Right eye exhibits discharge. No foreign body present in the right eye. Left eye exhibits discharge. No foreign body present in the left eye. Right conjunctiva is injected. Right conjunctiva has no hemorrhage. Left conjunctiva is injected. Left conjunctiva has no hemorrhage.  Neck: Neck  supple.  Cardiovascular: Normal rate, regular rhythm, normal heart sounds and intact distal pulses.   Pulmonary/Chest: Effort normal and breath sounds normal. No respiratory distress. He has no wheezes. He has no rales. He exhibits no tenderness.  Lymphadenopathy:    He has no cervical adenopathy.  Neurological: He is alert and oriented to person, place, and time.  Skin: Skin is warm and dry.  Presence of an erythematous, crusting rash along patient's beard line noted on examination, with purulent drainage. No evidence of vesicle. No rash noted elsewhere.  Psychiatric: Affect normal.   Assessment/Plan: Impetigo Due to comorbid folliculitis, will treat with oral antibiotic. Rx Bactrim. Discussed appropriate hygiene measures with patient. Patient to call or return to clinic if symptoms not improving.  Folliculitis Rx Bactrim. Discussed appropriate hygiene measures with patient. Patient to return if symptoms not improving.  Acute bacterial conjunctivitis of both eyes Rx erythromycin ophthalmic ointment to apply to both eyes. Call or return to clinic if symptoms not improving or if vision changes occur.

## 2013-08-28 ENCOUNTER — Other Ambulatory Visit: Payer: Self-pay | Admitting: Family Medicine

## 2013-08-30 NOTE — Telephone Encounter (Signed)
Rx request to pharmacy/SLS PATIENT DUE FOR FOLLOW-UP OFFICE VISIT  

## 2013-09-16 ENCOUNTER — Other Ambulatory Visit: Payer: Self-pay | Admitting: Family Medicine

## 2013-10-05 ENCOUNTER — Telehealth: Payer: Self-pay | Admitting: Family Medicine

## 2013-10-05 NOTE — Telephone Encounter (Signed)
Left message for patient to return my call.

## 2013-10-05 NOTE — Telephone Encounter (Signed)
30 day supply of metformin and lisinopril sent to pharmacy.  Pt was due for 3 month follow up in December and needs to be seen before further refills can be given.  Please call pt to arrange appt.

## 2013-10-06 NOTE — Telephone Encounter (Signed)
Informed patient of medication refill and he scheduled appointment for 10/26/13

## 2013-10-19 ENCOUNTER — Other Ambulatory Visit: Payer: Self-pay | Admitting: Family Medicine

## 2013-10-19 NOTE — Telephone Encounter (Signed)
Will you inform pt that 1 month of medication was sent to patient but he needs follow up for addt refills

## 2013-10-20 NOTE — Telephone Encounter (Signed)
Patient already has appointment scheduled for 10/26/13

## 2013-10-23 ENCOUNTER — Other Ambulatory Visit: Payer: Self-pay | Admitting: Family Medicine

## 2013-10-25 ENCOUNTER — Telehealth: Payer: Self-pay | Admitting: *Deleted

## 2013-10-25 NOTE — Telephone Encounter (Signed)
Pt called stating he has an appt with [place] tomorrow. He wanted to make sure that we received documentation from his eye doctor that stated pt does not have diabetic retinopathy. He states we had previously documented in his EMR that he had retinopthy and he was trying to apply for insurance and this information was incorrect. Pt requests that we give him a letter at his follow up stating he does not have diabetic retinopathy. I remember seeing the statement from his ophthalmologist documenting this correction but do not see that it has been scanned into EMR yet.

## 2013-10-26 ENCOUNTER — Encounter: Payer: Self-pay | Admitting: Family

## 2013-10-26 ENCOUNTER — Ambulatory Visit (INDEPENDENT_AMBULATORY_CARE_PROVIDER_SITE_OTHER): Payer: BC Managed Care – PPO | Admitting: Family

## 2013-10-26 VITALS — BP 130/96 | HR 84 | Temp 98.6°F | Resp 18 | Ht 76.0 in | Wt 200.0 lb

## 2013-10-26 DIAGNOSIS — I10 Essential (primary) hypertension: Secondary | ICD-10-CM

## 2013-10-26 DIAGNOSIS — E785 Hyperlipidemia, unspecified: Secondary | ICD-10-CM

## 2013-10-26 DIAGNOSIS — E119 Type 2 diabetes mellitus without complications: Secondary | ICD-10-CM

## 2013-10-26 LAB — HEPATIC FUNCTION PANEL
ALBUMIN: 4.4 g/dL (ref 3.5–5.2)
ALT: 18 U/L (ref 0–53)
AST: 21 U/L (ref 0–37)
Alkaline Phosphatase: 55 U/L (ref 39–117)
Bilirubin, Direct: 0.1 mg/dL (ref 0.0–0.3)
Indirect Bilirubin: 0.5 mg/dL (ref 0.2–1.2)
Total Bilirubin: 0.6 mg/dL (ref 0.2–1.2)
Total Protein: 7.2 g/dL (ref 6.0–8.3)

## 2013-10-26 LAB — BASIC METABOLIC PANEL WITH GFR
BUN: 10 mg/dL (ref 6–23)
CO2: 26 mEq/L (ref 19–32)
Calcium: 9.6 mg/dL (ref 8.4–10.5)
Chloride: 103 mEq/L (ref 96–112)
Creat: 0.89 mg/dL (ref 0.50–1.35)
Glucose, Bld: 129 mg/dL — ABNORMAL HIGH (ref 70–99)
Potassium: 5 mEq/L (ref 3.5–5.3)
SODIUM: 138 meq/L (ref 135–145)

## 2013-10-26 LAB — HEMOGLOBIN A1C
Hgb A1c MFr Bld: 8.6 % — ABNORMAL HIGH (ref <5.7)
Mean Plasma Glucose: 200 mg/dL — ABNORMAL HIGH (ref <117)

## 2013-10-26 MED ORDER — LISINOPRIL 30 MG PO TABS: 30.0000 mg | ORAL_TABLET | Freq: Every day | ORAL | Status: DC

## 2013-10-26 NOTE — Progress Notes (Signed)
Pre visit review using our clinic review tool, if applicable. No additional management support is needed unless otherwise documented below in the visit note. 

## 2013-10-26 NOTE — Progress Notes (Signed)
Subjective:    Patient ID: [name], male    DOB: 02/27/1951, 63 y.o.   MRN: [id number]  HPI  [name] is a 63 yr old male who presents today for follow up.  1) HTN- Current blood pressure medications include lisinopril, nifedipine.  BP Readings from Last 3 Encounters:  10/26/13 130/96  08/23/13 118/84  05/07/13 138/89   2) DM2- currently maintained on lantus, novolog and metformin.  Last A1C in September was 8.0. Recently had a diabetic eye exam which was negative for diabetic retinopathy.  Reports fasting sugars around 110.  Post prandial about 120-130. Reports one episode of hypoglycemia.  Reading was 64, but he did not eat dinner that night. He is using lantus 45 units novolog- 15 units AC meals bid.  Only eats 2 meals.   3) Hyperlipidemia- Currently maintained on crestor.  Last LDL was at goal in September- 69.     Review of Systems    see HPI  Past Medical History  Diagnosis Date  . History of colonic polyps   . Diabetes mellitus type II   . Hypertension   . Hyperlipidemia   . Low back pain   . Breast mass, right 03/2008  . Lipoma   . Abnormal myocardial perfusion study 2006    EF 44% ? inferoseptal ischemia. no cath done  . Contact dermatitis 01/31/2013    History   Social History  . Marital Status: Married    Spouse Name: N/A    Number of Children: N/A  . Years of Education: N/A   Occupational History  . Not on file.   Social History Main Topics  . Smoking status: Former Research scientist (life sciences)  . Smokeless tobacco: Never Used     Comment: 33 pack year history  . Alcohol Use: No  . Drug Use: No  . Sexual Activity: Not on file   Other Topics Concern  . Not on file   Social History Narrative   Occupation: carpenter   Married    Former Smoker -  33 pack year history   Alcohol use-no     Drug use-no              Past Surgical History  Procedure Laterality Date  . Lipoma excision  07/2008     Dr Gershon Crane    Family History  Problem Relation Age  of Onset  . Melanoma Father 72    deceased secondary to melanoma  . Hypertension Mother     alive -56    Allergies  Allergen Reactions  . Amlodipine Besylate     REACTION: ? caused left axillary nodules  . Hydrochlorothiazide     Current Outpatient Prescriptions on File Prior to Visit  Medication Sig Dispense Refill  . aspirin 81 MG tablet Take 81 mg by mouth daily.        . CRESTOR 20 MG tablet TAKE ONE TABLET BY MOUTH ONCE DAILY  30 tablet  0  . erythromycin ophthalmic ointment Place 1 application into both eyes 3 (three) times daily.  3.5 g  0  . glucose blood (ONE TOUCH TEST STRIPS) test strip Use to check blood sugar three times  a day as instructed Dx Code 250.00  300 each  11  . insulin aspart (NOVOLOG FLEXPEN) 100 UNIT/ML SOPN FlexPen Inject 15 Units into the skin 2 (two) times daily.  9 mL  3  . LANTUS SOLOSTAR 100 UNIT/ML Solostar Pen INJECT 40-55 UNITS INTO THE SKIN ONCE DAILY AT  BEDTIME  15 mL  2  . lisinopril (PRINIVIL,ZESTRIL) 20 MG tablet TAKE TWO TABLETS BY MOUTH ONCE DAILY  60 tablet  0  . metFORMIN (GLUCOPHAGE-XR) 500 MG 24 hr tablet TAKE TWO TABLETS BY MOUTH TWICE DAILY  120 tablet  0  . Multiple Vitamin (MULTIVITAMIN) tablet Take 1 tablet by mouth daily.        Marland Kitchen NIFEdipine (PROCARDIA XL/ADALAT-CC) 90 MG 24 hr tablet TAKE ONE TABLET BY MOUTH ONCE DAILY  30 tablet  0  . RELION SHORT PEN NEEDLES 31G X 8 MM MISC USE  TWICE DAILY  FOR INSULIN INJECTION  100 each  0  . sulfamethoxazole-trimethoprim (BACTRIM DS) 800-160 MG per tablet Take 1 tablet by mouth 2 (two) times daily.  14 tablet  0  . tadalafil (CIALIS) 20 MG tablet One by mouth every 36 hours as needed  6 tablet  4   No current facility-administered medications on file prior to visit.    BP 130/96  Pulse 84  Temp(Src) 98.6 F (37 C) (Oral)  Resp 18  Ht 6\' 4"  (1.93 m)  Wt 200 lb 0.6 oz (90.738 kg)  BMI 24.36 kg/m2  SpO2 98%    Objective:   Physical Exam  Constitutional: He is oriented to person,  place, and time. He appears well-developed and well-nourished. No distress.  Cardiovascular: Normal rate and regular rhythm.   No murmur heard. Pulmonary/Chest: Effort normal and breath sounds normal. No respiratory distress. He has no wheezes. He has no rales. He exhibits no tenderness.  Musculoskeletal: He exhibits no edema.  Neurological: He is alert and oriented to person, place, and time.  Psychiatric: He has a normal mood and affect. His behavior is normal. Judgment and thought content normal.          Assessment & Plan:

## 2013-10-26 NOTE — Assessment & Plan Note (Signed)
Clinically stable.  Continue lantus, novolog.  Obtain A1C.

## 2013-10-26 NOTE — Assessment & Plan Note (Signed)
LDL at goal. Continue statin, obtain LFT.

## 2013-10-26 NOTE — Assessment & Plan Note (Signed)
DBP has been above goal for last several visits. Will increase lisinopril form 20mg  to 30mg .  Will have pt follow up in 1 month for BP check and follow up bmet.

## 2013-10-26 NOTE — Patient Instructions (Signed)
Please complete lab work prior to leaving.  Please increase lisinopril from 20mg  to 30mg .   Follow up in 1 month.

## 2013-10-27 ENCOUNTER — Telehealth: Payer: Self-pay | Admitting: Family

## 2013-10-27 MED ORDER — SITAGLIPTIN PHOSPHATE 100 MG PO TABS: 100.0000 mg | ORAL_TABLET | Freq: Every day | ORAL | Status: DC

## 2013-10-27 NOTE — Telephone Encounter (Signed)
Please call pt and let him know that his sugar is above goal. I would like him to continue current insulin dosing and metformin, but add [place] once daily.

## 2013-10-27 NOTE — Telephone Encounter (Signed)
Left message on home # to return my call. 

## 2013-10-27 NOTE — Telephone Encounter (Signed)
Relevant patient education mailed to patient.  

## 2013-10-28 ENCOUNTER — Telehealth: Payer: Self-pay | Admitting: Family

## 2013-10-28 ENCOUNTER — Telehealth: Payer: Self-pay

## 2013-10-28 NOTE — Telephone Encounter (Signed)
Please call patient. His wife states that at last visit his information about an eye exam was incorrectly put in his chart and now his claim with [employer] has been denied. His wife states that we had originally sent in a letter to [place] needs a new letter stating that a mistake was made in that exam??

## 2013-10-28 NOTE — Telephone Encounter (Signed)
Relevant patient education mailed to patient.  

## 2013-11-01 ENCOUNTER — Encounter: Payer: Self-pay | Admitting: Family

## 2013-11-01 NOTE — Telephone Encounter (Signed)
Notified pt and he voices understanding. 

## 2013-11-01 NOTE — Telephone Encounter (Signed)
See letter.

## 2013-11-01 NOTE — Telephone Encounter (Signed)
Notified pt and he requested that I fax letter to his wife, [name] at [phone]. Letter faxed.

## 2013-11-01 NOTE — Telephone Encounter (Signed)
Received record from [facility] that pt does not have diabetic retinopathy. Pt would like a letter from [place] stating it was noted in error that pt previously had diabetic retinopathy. Record forwarded to Provider for review. Please advise.

## 2013-11-01 NOTE — Telephone Encounter (Signed)
Pt's wife states insurance is wanting to see notation in the letter that the previous diagnosis of diabetic retinopathy was a mistake. Pt's eye doctor had sent a letter stating there was an error in previous documentation of diabetic retinopathy but I do not see it scanned into the EMR. Called Dr Maurie Boettcher office and asked if this information could be refaxed. Awaiting records.

## 2013-11-10 ENCOUNTER — Other Ambulatory Visit: Payer: Self-pay | Admitting: Family

## 2013-11-16 ENCOUNTER — Encounter: Payer: Self-pay | Admitting: Family

## 2013-11-19 ENCOUNTER — Other Ambulatory Visit: Payer: Self-pay | Admitting: Family Medicine

## 2013-11-21 ENCOUNTER — Other Ambulatory Visit: Payer: Self-pay | Admitting: Family Medicine

## 2013-11-30 ENCOUNTER — Ambulatory Visit (INDEPENDENT_AMBULATORY_CARE_PROVIDER_SITE_OTHER): Payer: BC Managed Care – PPO | Admitting: Family

## 2013-11-30 ENCOUNTER — Encounter: Payer: Self-pay | Admitting: Family

## 2013-11-30 VITALS — BP 140/80 | HR 84 | Temp 97.7°F | Resp 16 | Ht 76.0 in | Wt 203.0 lb

## 2013-11-30 DIAGNOSIS — I10 Essential (primary) hypertension: Secondary | ICD-10-CM

## 2013-11-30 LAB — BASIC METABOLIC PANEL
BUN: 10 mg/dL (ref 6–23)
CO2: 27 meq/L (ref 19–32)
CREATININE: 0.84 mg/dL (ref 0.50–1.35)
Calcium: 9.7 mg/dL (ref 8.4–10.5)
Chloride: 104 mEq/L (ref 96–112)
GLUCOSE: 82 mg/dL (ref 70–99)
Potassium: 4.7 mEq/L (ref 3.5–5.3)
Sodium: 140 mEq/L (ref 135–145)

## 2013-11-30 NOTE — Progress Notes (Signed)
Pre visit review using our clinic review tool, if applicable. No additional management support is needed unless otherwise documented below in the visit note. 

## 2013-11-30 NOTE — Progress Notes (Signed)
Subjective:    Patient ID: [name], male    DOB: 12-Mar-1951, 63 y.o.   MRN: [id number]  HPI  [name] is a 63 yr old male who presents today for follow up of his hypertension. Last visit his DBP was elevated and his lisinopril was increased from 20mg  to 30 mg.  Denies CP/SOB or swelling.  BP Readings from Last 3 Encounters:  11/30/13 140/80  10/26/13 130/96  08/23/13 118/84     Review of Systems See HPi  Past Medical History  Diagnosis Date  . History of colonic polyps   . Diabetes mellitus type II   . Hypertension   . Hyperlipidemia   . Low back pain   . Breast mass, right 03/2008  . Lipoma   . Abnormal myocardial perfusion study 2006    EF 44% ? inferoseptal ischemia. no cath done  . Contact dermatitis 01/31/2013    History   Social History  . Marital Status: Married    Spouse Name: N/A    Number of Children: N/A  . Years of Education: N/A   Occupational History  . Not on file.   Social History Main Topics  . Smoking status: Former Research scientist (life sciences)  . Smokeless tobacco: Never Used     Comment: 33 pack year history  . Alcohol Use: No  . Drug Use: No  . Sexual Activity: Not on file   Other Topics Concern  . Not on file   Social History Narrative   Occupation: carpenter   Married    Former Smoker -  33 pack year history   Alcohol use-no     Drug use-no              Past Surgical History  Procedure Laterality Date  . Lipoma excision  07/2008     Dr Gershon Crane    Family History  Problem Relation Age of Onset  . Melanoma Father 20    deceased secondary to melanoma  . Hypertension Mother     alive -69    Allergies  Allergen Reactions  . Amlodipine Besylate     REACTION: ? caused left axillary nodules  . Hydrochlorothiazide     Current Outpatient Prescriptions on File Prior to Visit  Medication Sig Dispense Refill  . aspirin 81 MG tablet Take 81 mg by mouth daily.        . CRESTOR 20 MG tablet TAKE ONE TABLET BY MOUTH ONCE DAILY  30  tablet  2  . glucose blood (ONE TOUCH TEST STRIPS) test strip Use to check blood sugar three times  a day as instructed Dx Code 250.00  300 each  11  . insulin aspart (NOVOLOG FLEXPEN) 100 UNIT/ML SOPN FlexPen Inject 15 Units into the skin 2 (two) times daily.  9 mL  3  . LANTUS SOLOSTAR 100 UNIT/ML Solostar Pen INJECT 40-55 UNITS INTO THE SKIN ONCE DAILY AT BEDTIME  15 mL  2  . lisinopril (PRINIVIL,ZESTRIL) 30 MG tablet Take 1 tablet (30 mg total) by mouth daily.  30 tablet  2  . metFORMIN (GLUCOPHAGE-XR) 500 MG 24 hr tablet TAKE TWO TABLETS BY MOUTH TWICE DAILY  120 tablet  1  . Multiple Vitamin (MULTIVITAMIN) tablet Take 1 tablet by mouth daily.        Marland Kitchen NIFEdipine (PROCARDIA XL/ADALAT-CC) 90 MG 24 hr tablet TAKE ONE TABLET BY MOUTH ONCE DAILY  30 tablet  2  . RELION SHORT PEN NEEDLES 31G X 8 MM MISC USE ONE  TWICE DAILY FOR  INSULIN  INJECTION  100 each  2  . sitaGLIPtin (JANUVIA) 100 MG tablet Take 1 tablet (100 mg total) by mouth daily.  30 tablet  2  . tadalafil (CIALIS) 20 MG tablet One by mouth every 36 hours as needed  6 tablet  4   No current facility-administered medications on file prior to visit.    BP 140/80  Pulse 84  Temp(Src) 97.7 F (36.5 C) (Oral)  Resp 16  Ht 6\' 4"  (1.93 m)  Wt 203 lb 0.6 oz (92.098 kg)  BMI 24.72 kg/m2  SpO2 98%       Objective:   Physical Exam  Constitutional: He is oriented to person, place, and time. He appears well-developed and well-nourished. No distress.  HENT:  Head: Normocephalic and atraumatic.  Cardiovascular: Normal rate and regular rhythm.   No murmur heard. Pulmonary/Chest: Effort normal and breath sounds normal. No respiratory distress. He has no wheezes. He has no rales. He exhibits no tenderness.  Neurological: He is alert and oriented to person, place, and time.  Psychiatric: He has a normal mood and affect. His behavior is normal. Judgment and thought content normal.          Assessment & Plan:

## 2013-11-30 NOTE — Assessment & Plan Note (Addendum)
BP stable on current meds.  Repeat BP check by me was 126/82.  Obtain bmet.  Follow up in June.

## 2013-11-30 NOTE — Patient Instructions (Signed)
Please complete lab work prior to leaving. Follow up in June after 6/3.

## 2013-12-01 ENCOUNTER — Encounter: Payer: Self-pay | Admitting: Family

## 2014-01-17 ENCOUNTER — Other Ambulatory Visit: Payer: Self-pay | Admitting: Family

## 2014-02-01 ENCOUNTER — Ambulatory Visit (INDEPENDENT_AMBULATORY_CARE_PROVIDER_SITE_OTHER): Payer: BC Managed Care – PPO | Admitting: Family

## 2014-02-01 ENCOUNTER — Encounter: Payer: Self-pay | Admitting: Family

## 2014-02-01 VITALS — BP 138/88 | HR 91 | Temp 98.0°F | Resp 16 | Ht 76.0 in | Wt 203.0 lb

## 2014-02-01 DIAGNOSIS — E785 Hyperlipidemia, unspecified: Secondary | ICD-10-CM

## 2014-02-01 DIAGNOSIS — I10 Essential (primary) hypertension: Secondary | ICD-10-CM

## 2014-02-01 DIAGNOSIS — E119 Type 2 diabetes mellitus without complications: Secondary | ICD-10-CM

## 2014-02-01 LAB — LIPID PANEL
CHOL/HDL RATIO: 2.9 ratio
Cholesterol: 132 mg/dL (ref 0–200)
HDL: 46 mg/dL (ref >39)
LDL CALC: 71 mg/dL (ref 0–99)
Triglycerides: 75 mg/dL (ref <150)
VLDL: 15 mg/dL (ref 0–40)

## 2014-02-01 LAB — HEMOGLOBIN A1C
Hgb A1c MFr Bld: 7.8 % — ABNORMAL HIGH (ref <5.7)
Mean Plasma Glucose: 177 mg/dL — ABNORMAL HIGH (ref <117)

## 2014-02-01 MED ORDER — TADALAFIL 20 MG PO TABS: ORAL_TABLET | ORAL | Status: DC

## 2014-02-01 NOTE — Patient Instructions (Signed)
Please complete your lab work prior to leaving. Follow up in 3 months.   

## 2014-02-01 NOTE — Assessment & Plan Note (Signed)
BP stable on current meds. Continue same.  

## 2014-02-01 NOTE — Assessment & Plan Note (Signed)
Stable.  Continue current insulin doses.  Obtain A1C.

## 2014-02-01 NOTE — Assessment & Plan Note (Signed)
Tolerating statin. Obtain FLP.   

## 2014-02-01 NOTE — Progress Notes (Signed)
Subjective:    Patient ID: [name], male    DOB: 11-26-1950, 63 y.o.   MRN: [id number]  HPI  Mr. Bua is a 63 yr old male who presents today for follow up of multiple medical problems.  1) Diabetes- 80-90's fasting.  Not checking as much later in the day. But when he does check generally 110-120 after lunch. Using lantus 45 units nightly. Using novolog 15 units twice daily- only eats lunch and dinner.  Reports occasionally he has sugars as low as 70 in the AM. Only occurs if he did not eat well the night before.   2) HTN-on nifedipine and lisinopril. Denies CP/SOB or swelling. BP Readings from Last 3 Encounters:  02/01/14 138/88  11/30/13 140/80  10/26/13 130/96    3) Hyperlipidemia-on crestor. Denies myalgia.  Occasional leg pain after long day at work. No cramping on weekends.  Review of Systems See HPI  Past Medical History  Diagnosis Date  . History of colonic polyps   . Diabetes mellitus type II   . Hypertension   . Hyperlipidemia   . Low back pain   . Breast mass, right 03/2008  . Lipoma   . Abnormal myocardial perfusion study 2006    EF 44% ? inferoseptal ischemia. no cath done  . Contact dermatitis 01/31/2013    History   Social History  . Marital Status: Married    Spouse Name: N/A    Number of Children: N/A  . Years of Education: N/A   Occupational History  . Not on file.   Social History Main Topics  . Smoking status: Former Research scientist (life sciences)  . Smokeless tobacco: Never Used     Comment: 33 pack year history  . Alcohol Use: No  . Drug Use: No  . Sexual Activity: Not on file   Other Topics Concern  . Not on file   Social History Narrative   Occupation: carpenter   Married    Former Smoker -  33 pack year history   Alcohol use-no     Drug use-no              Past Surgical History  Procedure Laterality Date  . Lipoma excision  07/2008     Dr Gershon Crane    Family History  Problem Relation Age of Onset  . Melanoma Father 27    deceased  secondary to melanoma  . Hypertension Mother     alive -73    Allergies  Allergen Reactions  . Amlodipine Besylate     REACTION: ? caused left axillary nodules  . Hydrochlorothiazide     Current Outpatient Prescriptions on File Prior to Visit  Medication Sig Dispense Refill  . aspirin 81 MG tablet Take 81 mg by mouth daily.        . CRESTOR 20 MG tablet TAKE ONE TABLET BY MOUTH ONCE DAILY  30 tablet  2  . glucose blood (ONE TOUCH TEST STRIPS) test strip Use to check blood sugar three times  a day as instructed Dx Code 250.00  300 each  11  . insulin aspart (NOVOLOG FLEXPEN) 100 UNIT/ML SOPN FlexPen Inject 15 Units into the skin 2 (two) times daily.  9 mL  3  . LANTUS SOLOSTAR 100 UNIT/ML Solostar Pen INJECT 40-55 UNITS INTO THE SKIN ONCE DAILY AT BEDTIME  15 mL  2  . lisinopril (PRINIVIL,ZESTRIL) 30 MG tablet Take 1 tablet (30 mg total) by mouth daily.  30 tablet  2  . metFORMIN (  GLUCOPHAGE-XR) 500 MG 24 hr tablet TAKE TWO TABLETS BY MOUTH TWICE DAILY  120 tablet  2  . Multiple Vitamin (MULTIVITAMIN) tablet Take 1 tablet by mouth daily.        Marland Kitchen NIFEdipine (PROCARDIA XL/ADALAT-CC) 90 MG 24 hr tablet TAKE ONE TABLET BY MOUTH ONCE DAILY  30 tablet  2  . RELION SHORT PEN NEEDLES 31G X 8 MM MISC USE ONE  TWICE DAILY FOR  INSULIN  INJECTION  100 each  2  . sitaGLIPtin (JANUVIA) 100 MG tablet Take 1 tablet (100 mg total) by mouth daily.  30 tablet  2  . tadalafil (CIALIS) 20 MG tablet One by mouth every 36 hours as needed  6 tablet  4   No current facility-administered medications on file prior to visit.    BP 138/88  Pulse 91  Temp(Src) 98 F (36.7 C) (Oral)  Resp 16  Ht 6\' 4"  (1.93 m)  Wt 203 lb (92.08 kg)  BMI 24.72 kg/m2  SpO2 94%       Objective:   Physical Exam        Assessment & Plan:

## 2014-02-01 NOTE — Progress Notes (Signed)
Pre visit review using our clinic review tool, if applicable. No additional management support is needed unless otherwise documented below in the visit note. 

## 2014-02-02 ENCOUNTER — Telehealth: Payer: Self-pay | Admitting: Family

## 2014-02-02 MED ORDER — CANAGLIFLOZIN 100 MG PO TABS: 1.0000 | ORAL_TABLET | Freq: Every day | ORAL | Status: DC

## 2014-02-02 NOTE — Telephone Encounter (Signed)
If he would like, we can increase his lantus to 50 units QHS instead.  I will cancel rx for invokana.

## 2014-02-02 NOTE — Telephone Encounter (Signed)
Sugar remains above goal, but better than it was last visit.  I would like him to add Invokana one tablet by mouth daily.

## 2014-02-02 NOTE — Telephone Encounter (Signed)
Notified pt and he voices understanding. 

## 2014-02-02 NOTE — Telephone Encounter (Signed)
Requesting to speak with MD, not happy with new meds.

## 2014-02-02 NOTE — Telephone Encounter (Signed)
Spoke with pt. He states that he is already on metformin and insulin and is concerned about adding another medication. Wants to know if better control can be obtained by adjusting his current medications or changing medications altogether. Just does not want to add another medication. Please advise.

## 2014-02-08 ENCOUNTER — Other Ambulatory Visit: Payer: Self-pay | Admitting: Family

## 2014-02-19 ENCOUNTER — Other Ambulatory Visit: Payer: Self-pay | Admitting: Family

## 2014-02-22 ENCOUNTER — Other Ambulatory Visit: Payer: Self-pay | Admitting: Family

## 2014-03-08 ENCOUNTER — Telehealth: Payer: Self-pay | Admitting: Family

## 2014-03-08 NOTE — Telephone Encounter (Signed)
Refill- novolog flex pen  [facility] on [address]

## 2014-03-09 MED ORDER — INSULIN ASPART 100 UNIT/ML FLEXPEN: 15.0000 [IU] | PEN_INJECTOR | Freq: Two times a day (BID) | SUBCUTANEOUS | Status: DC

## 2014-03-09 NOTE — Telephone Encounter (Signed)
Refill called to pharmacy.

## 2014-03-10 ENCOUNTER — Other Ambulatory Visit: Payer: Self-pay | Admitting: Physician Assistant

## 2014-04-12 ENCOUNTER — Other Ambulatory Visit: Payer: Self-pay | Admitting: Family

## 2014-04-12 NOTE — Telephone Encounter (Signed)
Rx sent to pharmacy. LDM 

## 2014-04-29 ENCOUNTER — Other Ambulatory Visit: Payer: Self-pay | Admitting: Family

## 2014-04-29 MED ORDER — LISINOPRIL 30 MG PO TABS: 30.0000 mg | ORAL_TABLET | Freq: Every day | ORAL | Status: DC

## 2014-05-09 ENCOUNTER — Ambulatory Visit (INDEPENDENT_AMBULATORY_CARE_PROVIDER_SITE_OTHER): Payer: BC Managed Care – PPO | Admitting: Family

## 2014-05-09 ENCOUNTER — Encounter: Payer: Self-pay | Admitting: Family

## 2014-05-09 VITALS — BP 130/88 | HR 99 | Temp 97.8°F | Resp 16 | Ht 76.0 in | Wt 208.0 lb

## 2014-05-09 DIAGNOSIS — E785 Hyperlipidemia, unspecified: Secondary | ICD-10-CM

## 2014-05-09 DIAGNOSIS — I10 Essential (primary) hypertension: Secondary | ICD-10-CM

## 2014-05-09 DIAGNOSIS — Z23 Encounter for immunization: Secondary | ICD-10-CM

## 2014-05-09 DIAGNOSIS — E119 Type 2 diabetes mellitus without complications: Secondary | ICD-10-CM

## 2014-05-09 LAB — BASIC METABOLIC PANEL
BUN: 11 mg/dL (ref 6–23)
CALCIUM: 9 mg/dL (ref 8.4–10.5)
CHLORIDE: 104 meq/L (ref 96–112)
CO2: 22 meq/L (ref 19–32)
Creatinine, Ser: 0.8 mg/dL (ref 0.4–1.5)
GFR: 131.35 mL/min (ref >60.00)
GLUCOSE: 92 mg/dL (ref 70–99)
Potassium: 3.6 mEq/L (ref 3.5–5.1)
SODIUM: 137 meq/L (ref 135–145)

## 2014-05-09 LAB — HEPATIC FUNCTION PANEL
ALBUMIN: 4.2 g/dL (ref 3.5–5.2)
ALK PHOS: 50 U/L (ref 39–117)
ALT: 19 U/L (ref 0–53)
AST: 23 U/L (ref 0–37)
Bilirubin, Direct: 0 mg/dL (ref 0.0–0.3)
TOTAL PROTEIN: 7.7 g/dL (ref 6.0–8.3)
Total Bilirubin: 0.3 mg/dL (ref 0.2–1.2)

## 2014-05-09 LAB — MICROALBUMIN / CREATININE URINE RATIO
Creatinine,U: 99 mg/dL
Microalb Creat Ratio: 2.8 mg/g (ref 0.0–30.0)
Microalb, Ur: 2.8 mg/dL — ABNORMAL HIGH (ref 0.0–1.9)

## 2014-05-09 LAB — HEMOGLOBIN A1C: Hgb A1c MFr Bld: 8 % — ABNORMAL HIGH (ref 4.6–6.5)

## 2014-05-09 NOTE — Assessment & Plan Note (Signed)
At goal on crestor, rare muscle cramping- will monitor. Continue same, obtain LFT.

## 2014-05-09 NOTE — Progress Notes (Signed)
Subjective:    Patient ID: [name], male    DOB: [birth year], 63 y.o.   MRN: [id number]  HPI  [name] is a 63 yr old male who presents today for follow up.    1) DM2- Pt is maintained on lantus, metformin and novolog. Reports AM fasting sugars have been low. Generally 60-90 but this AM was 64.  He reports that he is taking lantus 50-55 units once daily. Reports that he uses novolog BID with sliding scale. Reports that before meals he has sugars about 130.   Eye exam- reports eye exam this past summer, reports healthy eyes.   Lab Results  Component Value Date   HGBA1C 7.8* 02/01/2014   HGBA1C 8.6* 10/26/2013   HGBA1C 8.0* 05/07/2013   Lab Results  Component Value Date   MICROALBUR 0.81 05/07/2013   LDLCALC 71 02/01/2014   CREATININE 0.84 11/30/2013   Hyperlipidemia- maintained on crestor. Reports rare cramping of his legs.   HTN-  Denies CP/SOB or swelling.  BP Readings from Last 3 Encounters:  05/09/14 130/88  02/01/14 138/88  11/30/13 140/80   Insect flew into his right tear yeasterday and he would like to make sure it is out.      Review of Systems See HPI  Past Medical History  Diagnosis Date  . History of colonic polyps   . Diabetes mellitus type II   . Hypertension   . Hyperlipidemia   . Low back pain   . Breast mass, right 03/2008  . Lipoma   . Abnormal myocardial perfusion study 2006    EF 44% ? inferoseptal ischemia. no cath done  . Contact dermatitis 01/31/2013    History   Social History  . Marital Status: Married    Spouse Name: N/A    Number of Children: N/A  . Years of Education: N/A   Occupational History  . Not on file.   Social History Main Topics  . Smoking status: Former Research scientist (life sciences)  . Smokeless tobacco: Never Used     Comment: 33 pack year history  . Alcohol Use: No  . Drug Use: No  . Sexual Activity: Not on file   Other Topics Concern  . Not on file   Social History Narrative   Occupation: carpenter   Married    Former  Smoker -  33 pack year history   Alcohol use-no     Drug use-no              Past Surgical History  Procedure Laterality Date  . Lipoma excision  07/2008     Dr Gershon Crane    Family History  Problem Relation Age of Onset  . Melanoma Father 44    deceased secondary to melanoma  . Hypertension Mother     alive -12    Allergies  Allergen Reactions  . Amlodipine Besylate     REACTION: ? caused left axillary nodules  . Hydrochlorothiazide     Current Outpatient Prescriptions on File Prior to Visit  Medication Sig Dispense Refill  . aspirin 81 MG tablet Take 81 mg by mouth daily.        . CRESTOR 20 MG tablet TAKE ONE TABLET BY MOUTH ONCE DAILY  30 tablet  3  . glucose blood (ONE TOUCH TEST STRIPS) test strip Use to check blood sugar three times  a day as instructed Dx Code 250.00  300 each  11  . insulin aspart (NOVOLOG FLEXPEN) 100 UNIT/ML FlexPen Inject 15  Units into the skin 2 (two) times daily.  9 mL  3  . JANUVIA 100 MG tablet TAKE ONE TABLET BY MOUTH ONCE DAILY  30 tablet  3  . lisinopril (PRINIVIL,ZESTRIL) 30 MG tablet Take 1 tablet (30 mg total) by mouth daily.  30 tablet  3  . metFORMIN (GLUCOPHAGE-XR) 500 MG 24 hr tablet TAKE TWO TABLETS BY MOUTH TWICE DAILY  120 tablet  3  . Multiple Vitamin (MULTIVITAMIN) tablet Take 1 tablet by mouth daily.        Marland Kitchen NIFEdipine (PROCARDIA XL/ADALAT-CC) 90 MG 24 hr tablet TAKE ONE TABLET BY MOUTH ONCE DAILY  30 tablet  3  . RELION SHORT PEN NEEDLES 31G X 8 MM MISC USE ONE  TWICE DAILY FOR  INSULIN  INJECTION  100 each  2  . tadalafil (CIALIS) 20 MG tablet One by mouth every 36 hours as needed  6 tablet  11   No current facility-administered medications on file prior to visit.    BP 130/88  Pulse 99  Temp(Src) 97.8 F (36.6 C) (Oral)  Resp 16  Ht 6\' 4"  (1.93 m)  Wt 208 lb (94.348 kg)  BMI 25.33 kg/m2  SpO2 95%       Objective:   Physical Exam  Constitutional: He is oriented to person, place, and time. He appears  well-developed and well-nourished. No distress.  HENT:  Head: Normocephalic and atraumatic.  Right Ear: Tympanic membrane and ear canal normal.  Cardiovascular: Normal rate and regular rhythm.   No murmur heard. Pulmonary/Chest: Effort normal and breath sounds normal. No respiratory distress. He has no wheezes. He has no rales. He exhibits no tenderness.  Neurological: He is alert and oriented to person, place, and time.  Skin: Skin is warm and dry.          Assessment & Plan:

## 2014-05-09 NOTE — Patient Instructions (Addendum)
Please complete lab work prior to leaving. Follow up in 3 months.  

## 2014-05-09 NOTE — Assessment & Plan Note (Signed)
+   Am hypoglycemia, has been taking up to 55 units of lantus, recommend that he cut back to 50. Continue sliding scale and metformin. Obtain a1c, urine microalbumin.

## 2014-05-09 NOTE — Progress Notes (Signed)
Pre visit review using our clinic review tool, if applicable. No additional management support is needed unless otherwise documented below in the visit note. 

## 2014-05-09 NOTE — Assessment & Plan Note (Signed)
BP stable on current meds. Continue same, obtain bmet.  

## 2014-05-10 ENCOUNTER — Telehealth: Payer: Self-pay | Admitting: Family

## 2014-05-10 DIAGNOSIS — E119 Type 2 diabetes mellitus without complications: Secondary | ICD-10-CM

## 2014-05-10 NOTE — Telephone Encounter (Signed)
Left detailed message on home# and to call if any questions. 

## 2014-05-10 NOTE — Telephone Encounter (Signed)
Reviewed labs. Sugar is above goal.  I would like to refer him to meet with endocrinology for further assistance with his blood sugar management.

## 2014-05-17 ENCOUNTER — Encounter: Payer: Self-pay | Admitting: Internal Medicine

## 2014-05-17 ENCOUNTER — Ambulatory Visit (INDEPENDENT_AMBULATORY_CARE_PROVIDER_SITE_OTHER): Payer: BC Managed Care – PPO | Admitting: Internal Medicine

## 2014-05-17 VITALS — BP 124/76 | HR 82 | Temp 97.7°F | Resp 12 | Ht 76.0 in | Wt 208.0 lb

## 2014-05-17 DIAGNOSIS — E1142 Type 2 diabetes mellitus with diabetic polyneuropathy: Secondary | ICD-10-CM

## 2014-05-17 DIAGNOSIS — E1149 Type 2 diabetes mellitus with other diabetic neurological complication: Secondary | ICD-10-CM

## 2014-05-17 DIAGNOSIS — E1140 Type 2 diabetes mellitus with diabetic neuropathy, unspecified: Secondary | ICD-10-CM

## 2014-05-17 DIAGNOSIS — IMO0002 Reserved for concepts with insufficient information to code with codable children: Secondary | ICD-10-CM

## 2014-05-17 DIAGNOSIS — E1165 Type 2 diabetes mellitus with hyperglycemia: Principal | ICD-10-CM

## 2014-05-17 MED ORDER — GLIPIZIDE ER 10 MG PO TB24: 10.0000 mg | ORAL_TABLET | Freq: Every day | ORAL | Status: DC

## 2014-05-17 MED ORDER — INSULIN GLARGINE 100 UNIT/ML SOLOSTAR PEN: 40.0000 [IU] | PEN_INJECTOR | Freq: Every morning | SUBCUTANEOUS | Status: DC

## 2014-05-17 NOTE — Progress Notes (Signed)
Patient ID: [name], male   DOB: November 06, 1950, 63 y.o.   MRN: [id number]  HPI: [name] is a 63 y.o.-year-old male, referred by his PCP, [name], for management of DM2, insulin-dependent, uncontrolled, with complications (PN).  Patient has been diagnosed with diabetes in 2012; he started insulin at dx. Last hemoglobin A1c was: Lab Results  Component Value Date   HGBA1C 8.0* 05/09/2014   HGBA1C 7.8* 02/01/2014   HGBA1C 8.6* 10/26/2013   Pt is on a regimen of: - Metformin XR 1000 mg po bid - Januvia 100 mg daily in am - Lantus 50 units qam  - Novolog 15 units daily before dinner  He does not remember trying the regular metformin.  Pt checks his sugars once a day and they are: - am: 64, 80-100 - 2h after b'fast: n/c - before lunch: 120-130 - 2h after lunch: n/c - before dinner: n/c - 2h after dinner: n/c - bedtime: n/c - nighttime: n/c No lows. Lowest sugar was 64; he has hypoglycemia awareness at 70.  Highest sugar was 230.  Pt's meals are: - Breakfast: skips or sausages + eggs + hash browns + coffee (2x a week); grits on the weekend (2x a week) - Lunch: grilled chicken, beans or slaw; salad; hot dog + bean soup - Dinner: [place] + mashed potatoes + veggies - Snacks: 4x a day, rarely before bedtime (fruit) No soft drinks, may have sweet tea  - no CKD, last BUN/creatinine:  Lab Results  Component Value Date   BUN 11 05/09/2014   CREATININE 0.8 05/09/2014  On Lisinopril. - last set of lipids: Lab Results  Component Value Date   CHOL 132 02/01/2014   HDL 46 02/01/2014   LDLCALC 71 02/01/2014   LDLDIRECT 177.6 10/07/2007   TRIG 75 02/01/2014   CHOLHDL 2.9 02/01/2014  On Crestor. - last eye exam was in 09/2013. No DR.  - no numbness, but + tingling in his feet at night.  Pt has FH of DM in father.  ROS: Constitutional: + weight gain,+  fatigue, no subjective hyperthermia/hypothermia Eyes: no blurry vision, no xerophthalmia ENT: no sore throat,  no nodules palpated in throat, no dysphagia/odynophagia, no hoarseness, + tinnitus Cardiovascular: no CP/SOB/palpitations/leg swelling Respiratory: no cough/SOB Gastrointestinal: no N/V/D/C Musculoskeletal: + muscle acnes/no joint aches Skin: no rashes Neurological: no tremors/numbness/tingling/dizziness Psychiatric: no depression/anxiety + low libido  Past Medical History  Diagnosis Date  . History of colonic polyps   . Diabetes mellitus type II   . Hypertension   . Hyperlipidemia   . Low back pain   . Breast mass, right 03/2008  . Lipoma   . Abnormal myocardial perfusion study 2006    EF 44% ? inferoseptal ischemia. no cath done  . Contact dermatitis 01/31/2013   Past Surgical History  Procedure Laterality Date  . Lipoma excision  07/2008     Dr Gershon Crane   History   Social History  . Marital Status: Married    Spouse Name: N/A    Number of Children: 2   Social History Main Topics  . Smoking status: Former Smoker - quit 2010  . Smokeless tobacco: Never Used     Comment: 33 pack year history  . Alcohol Use: Wine, 2x weekly, 2 drinks  . Drug Use: No   Social History Narrative   Occupation: carpenter   Married    Former Smoker -  33 pack year history   Alcohol use-no     Drug use-no  Current Outpatient Prescriptions on File Prior to Visit  Medication Sig Dispense Refill  . aspirin 81 MG tablet Take 81 mg by mouth daily.        . CRESTOR 20 MG tablet TAKE ONE TABLET BY MOUTH ONCE DAILY  30 tablet  3  . glucose blood (ONE TOUCH TEST STRIPS) test strip Use to check blood sugar three times  a day as instructed Dx Code 250.00  300 each  11  . insulin aspart (NOVOLOG FLEXPEN) 100 UNIT/ML FlexPen Inject 15 Units into the skin 2 (two) times daily.  9 mL  3  . Insulin Glargine (LANTUS SOLOSTAR) 100 UNIT/ML Solostar Pen INJECT 40-55 UNITS SUBCUTANEOUSLY EVERY MORNING      . JANUVIA 100 MG tablet TAKE ONE TABLET BY MOUTH ONCE DAILY  30 tablet  3  . lisinopril  (PRINIVIL,ZESTRIL) 30 MG tablet Take 1 tablet (30 mg total) by mouth daily.  30 tablet  3  . metFORMIN (GLUCOPHAGE-XR) 500 MG 24 hr tablet TAKE TWO TABLETS BY MOUTH TWICE DAILY  120 tablet  3  . Multiple Vitamin (MULTIVITAMIN) tablet Take 1 tablet by mouth daily.        Marland Kitchen NIFEdipine (PROCARDIA XL/ADALAT-CC) 90 MG 24 hr tablet TAKE ONE TABLET BY MOUTH ONCE DAILY  30 tablet  3  . RELION SHORT PEN NEEDLES 31G X 8 MM MISC USE ONE  TWICE DAILY FOR  INSULIN  INJECTION  100 each  2  . tadalafil (CIALIS) 20 MG tablet One by mouth every 36 hours as needed  6 tablet  11   No current facility-administered medications on file prior to visit.   Allergies  Allergen Reactions  . Amlodipine Besylate     REACTION: ? caused left axillary nodules  . Hydrochlorothiazide    Family History  Problem Relation Age of Onset  . Melanoma Father 34    deceased secondary to melanoma  . Hypertension Mother     alive -37   PE: BP 124/76  Pulse 82  Temp(Src) 97.7 F (36.5 C) (Oral)  Resp 12  Ht 6\' 4"  (1.93 m)  Wt 208 lb (94.348 kg)  BMI 25.33 kg/m2  SpO2 92% Wt Readings from Last 3 Encounters:  05/17/14 208 lb (94.348 kg)  05/09/14 208 lb (94.348 kg)  02/01/14 203 lb (92.08 kg)   Constitutional: normal weight, in NAD Eyes: PERRLA, EOMI, no exophthalmos ENT: moist mucous membranes, no thyromegaly, no cervical lymphadenopathy Cardiovascular: RRR, No MRG Respiratory: CTA B Gastrointestinal: abdomen soft, NT, ND, BS+ Musculoskeletal: no deformities, strength intact in all 4 Skin: moist, warm, no rashes Neurological: no tremor with outstretched hands, DTR normal in all 4  ASSESSMENT: 1. DM2, non-insulin-dependent, uncontrolled, withcomplications - PN  PLAN:  1. Patient with 3 year h/o uncontrolled diabetes, on oral antidiabetic regimen + basal insulin + dinnertime NovoLog, which became insufficient. He has low sugars in am and high sugars at night "staircase effect", a sign of too much basal insulin  and too little meal coverage. We will try Glipizide XL but may need NovoLog mealtime in the future. -  We discussed about options for treatment, and I suggested to:  Patient Instructions  Please continue: - Metformin XR 1000 mg 2x a day with meals - Januvia 100 mg daily in am - Novolog 15 units daily before dinner Please decrease: - Lantus to 40 units in am Start: - Glipizide XL 10 mg daily before breakfast if you eat breakfast or before lunch if you skip breakfast  Please let me know if sugars are consistently <80 or >180.  Please return in 1 month with your sugar log.   - Strongly advised him to start checking sugars at different times of the day - check 2 times a day, rotating checks - given sugar log and advised how to fill it and to bring it at next appt  - given foot care handout and explained the principles  - given instructions for hypoglycemia management "15-15 rule"  - advised for yearly eye exams  - Return to clinic in 1 mo with sugar log

## 2014-05-17 NOTE — Patient Instructions (Addendum)
Please continue: - Metformin XR 1000 mg 2x a day with meals - Januvia 100 mg daily in am - Novolog 15 units daily before dinner Please decrease: - Lantus to 40 units in am Start: - Glipizide XL 10 mg daily before breakfast if you eat breakfast or before lunch if you skip breakfast  Please let me know if sugars are consistently <80 or >180.  Please return in 1 month with your sugar log.   PATIENT INSTRUCTIONS FOR TYPE 2 DIABETES:  **Please join MyChart!** - see attached instructions about how to join if you have not done so already.  DIET AND EXERCISE Diet and exercise is an important part of diabetic treatment.  We recommended aerobic exercise in the form of brisk walking (working between 40-60% of maximal aerobic capacity, similar to brisk walking) for 150 minutes per week (such as 30 minutes five days per week) along with 3 times per week performing 'resistance' training (using various gauge rubber tubes with handles) 5-10 exercises involving the major muscle groups (upper body, lower body and core) performing 10-15 repetitions (or near fatigue) each exercise. Start at half the above goal but build slowly to reach the above goals. If limited by weight, joint pain, or disability, we recommend daily walking in a swimming pool with water up to waist to reduce pressure from joints while allow for adequate exercise.    BLOOD GLUCOSES Monitoring your blood glucoses is important for continued management of your diabetes. Please check your blood glucoses 2-4 times a day: fasting, before meals and at bedtime (you can rotate these measurements - e.g. one day check before the 3 meals, the next day check before 2 of the meals and before bedtime, etc.).   HYPOGLYCEMIA (low blood sugar) Hypoglycemia is usually a reaction to not eating, exercising, or taking too much insulin/ other diabetes drugs.  Symptoms include tremors, sweating, hunger, confusion, headache, etc. Treat IMMEDIATELY with 15 grams of  Carbs:   4 glucose tablets    cup regular juice/soda   2 tablespoons raisins   4 teaspoons sugar   1 tablespoon honey Recheck blood glucose in 15 mins and repeat above if still symptomatic/blood glucose <100.  RECOMMENDATIONS TO REDUCE YOUR RISK OF DIABETIC COMPLICATIONS: * Take your prescribed MEDICATION(S) * Follow a DIABETIC diet: Complex carbs, fiber rich foods, (monounsaturated and polyunsaturated) fats * AVOID saturated/trans fats, high fat foods, >2,300 mg salt per day. * EXERCISE at least 5 times a week for 30 minutes or preferably daily.  * DO NOT SMOKE OR DRINK more than 1 drink a day. * Check your FEET every day. Do not wear tightfitting shoes. Contact us if you develop an ulcer * See your EYE doctor once a year or more if needed * Get a FLU shot once a year * Get a PNEUMONIA vaccine once before and once after age 36 years  GOALS:  * Your Hemoglobin A1c of <7%  * fasting sugars need to be <130 * after meals sugars need to be <180 (2h after you start eating) * Your Systolic BP should be 010 or lower  * Your Diastolic BP should be 80 or lower  * Your HDL (Good Cholesterol) should be 40 or higher  * Your LDL (Bad Cholesterol) should be 100 or lower. * Your Triglycerides should be 150 or lower  * Your Urine microalbumin (kidney function) should be <30 * Your Body Mass Index should be 25 or lower    Please consider the following ways to  cut down carbs and fat and increase fiber and micronutrients in your diet: - substitute whole grain for white bread or pasta - substitute brown rice for white rice - substitute 90-calorie flat bread pieces for slices of bread when possible - substitute sweet potatoes or yams for white potatoes - substitute humus for margarine - substitute tofu for cheese when possible - substitute almond or rice milk for regular milk (would not drink soy milk daily due to concern for soy estrogen influence on breast cancer risk) - substitute dark  chocolate for other sweets when possible - substitute water - can add lemon or orange slices for taste - for diet sodas (artificial sweeteners will trick your body that you can eat sweets without getting calories and will lead you to overeating and weight gain in the long run) - do not skip breakfast or other meals (this will slow down the metabolism and will result in more weight gain over time)  - can try smoothies made from fruit and almond/rice milk in am instead of regular breakfast - can also try old-fashioned (not instant) oatmeal made with almond/rice milk in am - order the dressing on the side when eating salad at a restaurant (pour less than half of the dressing on the salad) - eat as little meat as possible - can try juicing, but should not forget that juicing will get rid of the fiber, so would alternate with eating raw veg./fruits or drinking smoothies - use as little oil as possible, even when using olive oil - can dress a salad with a mix of balsamic vinegar and lemon juice, for e.g. - use agave nectar, stevia sugar, or regular sugar rather than artificial sweateners - steam or broil/roast veggies  - snack on veggies/fruit/nuts (unsalted, preferably) when possible, rather than processed foods - reduce or eliminate aspartame in diet (it is in diet sodas, chewing gum, etc) Read the labels!  Try to read Dr. Janene Harvey book: "Program for Reversing Diabetes" for other ideas for healthy eating.

## 2014-05-20 ENCOUNTER — Other Ambulatory Visit: Payer: Self-pay | Admitting: Family

## 2014-06-03 ENCOUNTER — Telehealth: Payer: Self-pay | Admitting: Internal Medicine

## 2014-06-03 ENCOUNTER — Other Ambulatory Visit: Payer: Self-pay | Admitting: *Deleted

## 2014-06-03 MED ORDER — INSULIN GLARGINE 100 UNIT/ML SOLOSTAR PEN: 40.0000 [IU] | PEN_INJECTOR | Freq: Every morning | SUBCUTANEOUS | Status: DC

## 2014-06-03 NOTE — Telephone Encounter (Signed)
Pt needs refill on rx for lantus 40 u daily

## 2014-06-18 ENCOUNTER — Other Ambulatory Visit: Payer: Self-pay | Admitting: Family

## 2014-06-20 ENCOUNTER — Encounter: Payer: Self-pay | Admitting: Internal Medicine

## 2014-06-20 ENCOUNTER — Other Ambulatory Visit: Payer: Self-pay | Admitting: *Deleted

## 2014-06-20 ENCOUNTER — Ambulatory Visit (INDEPENDENT_AMBULATORY_CARE_PROVIDER_SITE_OTHER): Payer: BC Managed Care – PPO | Admitting: Internal Medicine

## 2014-06-20 VITALS — BP 136/70 | HR 92 | Temp 97.6°F | Resp 12 | Wt 205.0 lb

## 2014-06-20 DIAGNOSIS — E1165 Type 2 diabetes mellitus with hyperglycemia: Secondary | ICD-10-CM

## 2014-06-20 DIAGNOSIS — IMO0002 Reserved for concepts with insufficient information to code with codable children: Secondary | ICD-10-CM

## 2014-06-20 DIAGNOSIS — E1140 Type 2 diabetes mellitus with diabetic neuropathy, unspecified: Secondary | ICD-10-CM

## 2014-06-20 MED ORDER — INSULIN ASPART 100 UNIT/ML FLEXPEN: 15.0000 [IU] | PEN_INJECTOR | Freq: Every day | SUBCUTANEOUS | Status: DC

## 2014-06-20 MED ORDER — INSULIN GLARGINE 100 UNIT/ML SOLOSTAR PEN: 40.0000 [IU] | PEN_INJECTOR | Freq: Every morning | SUBCUTANEOUS | Status: DC

## 2014-06-20 MED ORDER — INSULIN GLARGINE 100 UNIT/ML SOLOSTAR PEN: 30.0000 [IU] | PEN_INJECTOR | Freq: Every morning | SUBCUTANEOUS | Status: DC

## 2014-06-20 NOTE — Progress Notes (Signed)
Patient ID: [name], male   DOB: 04/08/1951, 63 y.o.   MRN: [id number]  HPI: [name] is a 63 y.o.-year-old male, returning for f/u for DM2, dx in 2012, insulin-dependent since dx, uncontrolled, with complications (PN). Last visit 1 mo ago.  Last hemoglobin A1c was: Lab Results  Component Value Date   HGBA1C 8.0* 05/09/2014   HGBA1C 7.8* 02/01/2014   HGBA1C 8.6* 10/26/2013   Pt is on a regimen of: - Metformin XR 1000 mg po bid - Januvia 100 mg daily in am - Lantus 50 >> 40 units qam - ran out 3 weeks ago! - Novolog 15 >> 25 units daily before dinner  - Glipizide XL 10 mg daily before breakfast if you eat breakfast or before lunch if you skip breakfast He does not remember trying the regular metformin.  Pt checks his sugars 1-3x a day and they are: - am: 64, 80-100 >> 65, 77-120 - 2h after b'fast: n/c - before lunch: 120-130 >> 100, 121-176 (high when he did not take his Glipizide that am) - 2h after lunch: n/c - before dinner: n/c >> 80, 111-176 - 2h after dinner: n/c - bedtime: n/c >> 87, 92-197, 220x2 - nighttime: n/c No lows. Lowest sugar was 65; he has hypoglycemia awareness at 70.  Highest sugar was 220.  Pt's meals are: - Breakfast: skips or sausages + eggs + hash browns + coffee (2x a week); grits on the weekend (2x a week) - Lunch: grilled chicken, beans or slaw; salad; hot dog + bean soup - Dinner: [place] + mashed potatoes + veggies - Snacks: 4x a day, rarely before bedtime (fruit) No soft drinks, may have sweet tea  - no CKD, last BUN/creatinine:  Lab Results  Component Value Date   BUN 11 05/09/2014   CREATININE 0.8 05/09/2014  On Lisinopril. - last set of lipids: Lab Results  Component Value Date   CHOL 132 02/01/2014   HDL 46 02/01/2014   LDLCALC 71 02/01/2014   LDLDIRECT 177.6 10/07/2007   TRIG 75 02/01/2014   CHOLHDL 2.9 02/01/2014  On Crestor. - last eye exam was in 09/2013. No DR.  - no numbness, but + tingling in his feet at night.  I  reviewed pt's medications, allergies, PMH, social hx, family hx and no changes required, except as mentioned above.  ROS: Constitutional: no weight gain/loss, no fatigue, no subjective hyperthermia/hypothermia Eyes: no blurry vision, no xerophthalmia ENT: no sore throat, no nodules palpated in throat, no dysphagia/odynophagia, no hoarseness, + tinnitus Cardiovascular: no CP/SOB/palpitations/leg swelling Respiratory: no cough/SOB Gastrointestinal: no N/V/D/C Musculoskeletal: no muscle acnes/no joint aches Skin: no rashes Neurological: no tremors/numbness/tingling/dizziness + low libido  PE: BP 136/70  Pulse 92  Temp(Src) 97.6 F (36.4 C) (Oral)  Resp 12  Wt 205 lb (92.987 kg)  SpO2 96% Wt Readings from Last 3 Encounters:  06/20/14 205 lb (92.987 kg)  05/17/14 208 lb (94.348 kg)  05/09/14 208 lb (94.348 kg)   Constitutional: normal weight, in NAD Eyes: PERRLA, EOMI, no exophthalmos ENT: moist mucous membranes, no thyromegaly, no cervical lymphadenopathy Cardiovascular: RRR, No MRG Respiratory: CTA B Gastrointestinal: abdomen soft, NT, ND, BS+ Musculoskeletal: no deformities, strength intact in all 4 Skin: moist, warm, no rashes Neurological: no tremor with outstretched hands, DTR normal in all 4  ASSESSMENT: 1. DM2, insulin-dependent, uncontrolled, with complications - PN - ED  PLAN:  1. Patient with 3 year h/o uncontrolled diabetes, on oral antidiabetic regimen + basal insulin + dinnertime NovoLog.  He has been off Lantus (ran out, could not refill) for the last 3 weeks and he increased dinnertime NovoLog from 15 to 25 units. He has higher sugars since off Lantus but not consistently.  -  We discussed about options for treatment, and I suggested to re-add Lantus and try to see if he can do without NovoLog at night, now that we started Glipizide XL. I also suggested a VGo and he is interested in this:  Patient Instructions  Please continue: - Metformin XR 1000 mg 2x a day  with meals - Januvia 100 mg daily in am - Glipizide XL 10 mg daily in am - Novolog 15 units daily before dinner >> try for 2-3 days without this to see how your bedtime sugars are - restart NovoLog if sugars >180 at bedtime Please start: - Lantus to 30 units in am  Please let me know if sugars are consistently <80 or >180.  Please return in 1.5 month with your sugar log.   Please read the VGo brochure. - I will check with [place] Rep Re: VGo. We can try a VGo 20 with 2-5 clicks per meal (5 for dinner). In this case, we can stop Glipizide, and, of course, Lantus - continue checking sugars at different times of the day - check 2 times a day, rotating checks - advised for yearly eye exams >> he is UTD - he had the flu vaccine this season - Return to clinic in 1.5 mo with sugar log

## 2014-06-20 NOTE — Patient Instructions (Signed)
Please continue: - Metformin XR 1000 mg 2x a day with meals - Januvia 100 mg daily in am - Glipizide XL 10 mg daily in am - Novolog 15 units daily before dinner >> try for 2-3 days without this to see how your bedtime sugars are - restart NovoLog if sugars >180 at bedtime Please start: - Lantus to 30 units in am  Please let me know if sugars are consistently <80 or >180.  Please return in 1.5 month with your sugar log.   Please read the VGo brochure.

## 2014-06-22 ENCOUNTER — Other Ambulatory Visit: Payer: Self-pay | Admitting: Family

## 2014-07-27 ENCOUNTER — Telehealth: Payer: Self-pay | Admitting: Family

## 2014-07-27 DIAGNOSIS — E1149 Type 2 diabetes mellitus with other diabetic neurological complication: Secondary | ICD-10-CM

## 2014-07-27 MED ORDER — GLUCOSE BLOOD VI STRP: ORAL_STRIP | Status: DC

## 2014-07-27 NOTE — Telephone Encounter (Signed)
Caller name: chelsea w from [place] Relation to pt: Call back number: [phone] Pharmacy:  Reason for call:   [facility] calling from [place] requesting a new rx for test strips.

## 2014-07-27 NOTE — Telephone Encounter (Signed)
Refill sent.

## 2014-07-28 ENCOUNTER — Other Ambulatory Visit: Payer: Self-pay | Admitting: Family

## 2014-07-28 ENCOUNTER — Other Ambulatory Visit: Payer: Self-pay | Admitting: Physician Assistant

## 2014-07-29 ENCOUNTER — Telehealth: Payer: Self-pay | Admitting: Internal Medicine

## 2014-07-29 ENCOUNTER — Other Ambulatory Visit: Payer: Self-pay | Admitting: *Deleted

## 2014-07-29 MED ORDER — SITAGLIPTIN PHOSPHATE 100 MG PO TABS: 100.0000 mg | ORAL_TABLET | Freq: Every day | ORAL | Status: DC

## 2014-07-29 NOTE — Telephone Encounter (Signed)
Patient need refill of med Jaunvia 100 mg Nifedipine 90 mg, also need prior Auth on both medications.

## 2014-07-29 NOTE — Telephone Encounter (Signed)
Sent refill of Januvia to pt's pharmacy. Sent the refill request for Nifedinpine to Debbrah Alar, pt's PCP.

## 2014-08-08 ENCOUNTER — Encounter: Payer: Self-pay | Admitting: Family

## 2014-08-08 ENCOUNTER — Other Ambulatory Visit: Payer: Self-pay | Admitting: Family Medicine

## 2014-08-08 ENCOUNTER — Ambulatory Visit (INDEPENDENT_AMBULATORY_CARE_PROVIDER_SITE_OTHER): Payer: BC Managed Care – PPO | Admitting: Family

## 2014-08-08 VITALS — BP 122/80 | HR 85 | Temp 97.8°F | Resp 16 | Ht 76.0 in | Wt 207.4 lb

## 2014-08-08 DIAGNOSIS — E785 Hyperlipidemia, unspecified: Secondary | ICD-10-CM

## 2014-08-08 DIAGNOSIS — E1140 Type 2 diabetes mellitus with diabetic neuropathy, unspecified: Secondary | ICD-10-CM

## 2014-08-08 DIAGNOSIS — E1149 Type 2 diabetes mellitus with other diabetic neurological complication: Secondary | ICD-10-CM

## 2014-08-08 DIAGNOSIS — I10 Essential (primary) hypertension: Secondary | ICD-10-CM

## 2014-08-08 DIAGNOSIS — IMO0002 Reserved for concepts with insufficient information to code with codable children: Secondary | ICD-10-CM

## 2014-08-08 DIAGNOSIS — E1165 Type 2 diabetes mellitus with hyperglycemia: Secondary | ICD-10-CM

## 2014-08-08 LAB — BASIC METABOLIC PANEL
BUN: 14 mg/dL (ref 6–23)
CHLORIDE: 104 meq/L (ref 96–112)
CO2: 26 meq/L (ref 19–32)
CREATININE: 0.9 mg/dL (ref 0.4–1.5)
Calcium: 9.3 mg/dL (ref 8.4–10.5)
GFR: 111.05 mL/min (ref >60.00)
Glucose, Bld: 142 mg/dL — ABNORMAL HIGH (ref 70–99)
Potassium: 4.1 mEq/L (ref 3.5–5.1)
Sodium: 138 mEq/L (ref 135–145)

## 2014-08-08 LAB — HM DIABETES EYE EXAM

## 2014-08-08 LAB — HEMOGLOBIN A1C: HEMOGLOBIN A1C: 8 % — AB (ref 4.6–6.5)

## 2014-08-08 MED ORDER — ROSUVASTATIN CALCIUM 20 MG PO TABS: 20.0000 mg | ORAL_TABLET | Freq: Every day | ORAL | Status: DC

## 2014-08-08 MED ORDER — GLUCOSE BLOOD VI STRP: ORAL_STRIP | Status: DC

## 2014-08-08 NOTE — Assessment & Plan Note (Signed)
Resume crestor 

## 2014-08-08 NOTE — Assessment & Plan Note (Signed)
Stable on current meds.  Continue same. 

## 2014-08-08 NOTE — Assessment & Plan Note (Signed)
Did have one episode of hypoglycemia- advised pt to schedule follow up with Dr. Cruzita Lederer. Will obtain follow up A1C.  He will have eye exam today.

## 2014-08-08 NOTE — Progress Notes (Signed)
Subjective:    Patient ID: [name], male    DOB: [birth year], 63 y.o.   MRN: [id number]  HPI  1) DM2- Pt is currently maintained on the following medications for diabetes:Glipizide, novolog 15 units ac meals, lantus 30 units, metformin, januvia. He is followed by endocrinology:  Dr. Cruzita Lederer Last A1C was:   Lab Results  Component Value Date   HGBA1C 8.0* 05/09/2014  Last diabetic eye exam was- will be completed today Denies polyuria/polydipsia. Reports one episode of hypoglycemia which he attributes to addition of glipizide.  Home glucose readings range 90-120's rare 200 post prandial  2) HTN-  Patient is currently maintained on the following medications for blood pressure: lisinopril 30mg , and adalat. Patient reports good compliance with blood pressure medications. Patient denies chest pain, shortness of breath or swelling. Last 3 blood pressure readings in our office are as follows: BP Readings from Last 3 Encounters:  08/08/14 122/80  06/20/14 136/70  05/17/14 124/76    3)  Hyperlipidemia- Patient is currently maintained on the following medication for hyperlipidemia: crestor Last lipid panel as follows: Lab Results  Component Value Date   CHOL 132 02/01/2014   HDL 46 02/01/2014   LDLCALC 71 02/01/2014   LDLDIRECT 177.6 10/07/2007   TRIG 75 02/01/2014   CHOLHDL 2.9 02/01/2014   Patient reports fair compliance with low fat/low cholesterol diet.  Reports that he ran out of crestor 1 month ago and would like a refill.    Review of Systems See HPI  Past Medical History  Diagnosis Date  . History of colonic polyps   . Diabetes mellitus type II   . Hypertension   . Hyperlipidemia   . Low back pain   . Breast mass, right 03/2008  . Lipoma   . Abnormal myocardial perfusion study 2006    EF 44% ? inferoseptal ischemia. no cath done  . Contact dermatitis 01/31/2013    History   Social History  . Marital Status: Married    Spouse Name: N/A    Number of  Children: N/A  . Years of Education: N/A   Occupational History  . Not on file.   Social History Main Topics  . Smoking status: Former Research scientist (life sciences)  . Smokeless tobacco: Never Used     Comment: 33 pack year history  . Alcohol Use: No  . Drug Use: No  . Sexual Activity: Not on file   Other Topics Concern  . Not on file   Social History Narrative   Occupation: carpenter   Married    Former Smoker -  33 pack year history   Alcohol use-no     Drug use-no              Past Surgical History  Procedure Laterality Date  . Lipoma excision  07/2008     Dr Gershon Crane    Family History  Problem Relation Age of Onset  . Melanoma Father 72    deceased secondary to melanoma  . Hypertension Mother     alive -33    Allergies  Allergen Reactions  . Amlodipine Besylate     REACTION: ? caused left axillary nodules  . Hydrochlorothiazide     Current Outpatient Prescriptions on File Prior to Visit  Medication Sig Dispense Refill  . aspirin 81 MG tablet Take 81 mg by mouth daily.      . CRESTOR 20 MG tablet TAKE ONE TABLET BY MOUTH ONCE DAILY 30 tablet 3  . glipiZIDE (GLIPIZIDE XL)  10 MG 24 hr tablet Take 1 tablet (10 mg total) by mouth daily with breakfast. 30 tablet 2  . glucose blood (ONE TOUCH TEST STRIPS) test strip Use to check blood sugar three times  a day as instructed Dx Code 250.00 300 each 1  . insulin aspart (NOVOLOG FLEXPEN) 100 UNIT/ML FlexPen Inject 15 Units into the skin daily before supper. 9 mL 3  . Insulin Glargine (LANTUS SOLOSTAR) 100 UNIT/ML Solostar Pen Inject 30 Units into the skin every morning. 15 mL 2  . lisinopril (PRINIVIL,ZESTRIL) 30 MG tablet Take 1 tablet (30 mg total) by mouth daily. 30 tablet 3  . metFORMIN (GLUCOPHAGE-XR) 500 MG 24 hr tablet TAKE TWO TABLETS BY MOUTH TWICE DAILY 120 tablet 3  . Multiple Vitamin (MULTIVITAMIN) tablet Take 1 tablet by mouth daily.      Marland Kitchen NIFEdipine (ADALAT CC) 90 MG 24 hr tablet TAKE ONE TABLET BY MOUTH ONCE DAILY 30 tablet 2   . NIFEdipine (ADALAT CC) 90 MG 24 hr tablet TAKE ONE TABLET BY MOUTH ONCE DAILY 30 tablet 3  . RELION SHORT PEN NEEDLES 31G X 8 MM MISC USE TWICE DAILY FOR INSULIN INJECTION 100 each 2  . sitaGLIPtin (JANUVIA) 100 MG tablet Take 1 tablet (100 mg total) by mouth daily. 30 tablet 2  . tadalafil (CIALIS) 20 MG tablet One by mouth every 36 hours as needed 6 tablet 11   No current facility-administered medications on file prior to visit.    BP 122/80 mmHg  Pulse 85  Temp(Src) 97.8 F (36.6 C) (Oral)  Resp 16  Ht 6\' 4"  (1.93 m)  Wt 207 lb 6.4 oz (94.076 kg)  BMI 25.26 kg/m2  SpO2 95%       Objective:   Physical Exam  Constitutional: He is oriented to person, place, and time. He appears well-developed and well-nourished. No distress.  HENT:  Head: Normocephalic and atraumatic.  Cardiovascular: Normal rate and regular rhythm.   No murmur heard. Pulmonary/Chest: Effort normal and breath sounds normal. No respiratory distress. He has no wheezes. He has no rales. He exhibits no tenderness.  Neurological: He is alert and oriented to person, place, and time.  Psychiatric: He has a normal mood and affect. His behavior is normal. Judgment and thought content normal.          Assessment & Plan:

## 2014-08-08 NOTE — Progress Notes (Signed)
Pre visit review using our clinic review tool, if applicable. No additional management support is needed unless otherwise documented below in the visit note. 

## 2014-08-08 NOTE — Patient Instructions (Signed)
Please complete lab work prior to leaving. Schedule follow up with Dr. Cruzita Lederer.  Follow up in 3 months.

## 2014-08-09 ENCOUNTER — Encounter: Payer: Self-pay | Admitting: Family

## 2014-09-07 ENCOUNTER — Other Ambulatory Visit: Payer: Self-pay | Admitting: Family

## 2014-09-25 ENCOUNTER — Other Ambulatory Visit: Payer: Self-pay | Admitting: Family

## 2014-09-25 ENCOUNTER — Other Ambulatory Visit: Payer: Self-pay | Admitting: Internal Medicine

## 2014-09-26 NOTE — Telephone Encounter (Signed)
Rx request to pharmacy/[facility]  

## 2014-11-05 ENCOUNTER — Other Ambulatory Visit: Payer: Self-pay | Admitting: Internal Medicine

## 2014-11-07 ENCOUNTER — Telehealth: Payer: Self-pay | Admitting: *Deleted

## 2014-11-07 ENCOUNTER — Ambulatory Visit (INDEPENDENT_AMBULATORY_CARE_PROVIDER_SITE_OTHER): Payer: BC Managed Care – PPO | Admitting: Family

## 2014-11-07 ENCOUNTER — Encounter: Payer: Self-pay | Admitting: Family

## 2014-11-07 VITALS — BP 140/92 | HR 92 | Temp 98.0°F | Resp 16 | Ht 76.0 in | Wt 204.0 lb

## 2014-11-07 DIAGNOSIS — E1140 Type 2 diabetes mellitus with diabetic neuropathy, unspecified: Secondary | ICD-10-CM

## 2014-11-07 DIAGNOSIS — I10 Essential (primary) hypertension: Secondary | ICD-10-CM

## 2014-11-07 DIAGNOSIS — E1165 Type 2 diabetes mellitus with hyperglycemia: Secondary | ICD-10-CM

## 2014-11-07 DIAGNOSIS — IMO0002 Reserved for concepts with insufficient information to code with codable children: Secondary | ICD-10-CM

## 2014-11-07 DIAGNOSIS — E785 Hyperlipidemia, unspecified: Secondary | ICD-10-CM

## 2014-11-07 LAB — BASIC METABOLIC PANEL
BUN: 14 mg/dL (ref 6–23)
CO2: 26 meq/L (ref 19–32)
Calcium: 9.4 mg/dL (ref 8.4–10.5)
Chloride: 104 mEq/L (ref 96–112)
Creatinine, Ser: 0.87 mg/dL (ref 0.40–1.50)
GFR: 113.91 mL/min (ref >60.00)
Glucose, Bld: 136 mg/dL — ABNORMAL HIGH (ref 70–99)
POTASSIUM: 3.9 meq/L (ref 3.5–5.1)
SODIUM: 138 meq/L (ref 135–145)

## 2014-11-07 LAB — HEMOGLOBIN A1C: HEMOGLOBIN A1C: 8 % — AB (ref 4.6–6.5)

## 2014-11-07 NOTE — Telephone Encounter (Signed)
Called [phone] and requested most recent eye exam. Awaiting report.

## 2014-11-07 NOTE — Assessment & Plan Note (Signed)
At goal on crestor, continue same.  

## 2014-11-07 NOTE — Addendum Note (Signed)
Addended by: Harl Bowie on: 11/07/2014 07:51 AM   Modules accepted: Orders

## 2014-11-07 NOTE — Patient Instructions (Signed)
Please complete lab work prior to leaving. Contact Dr. Arman Filter office to arrange follow up to discuss your diabetes. Follow up with [place] in 3 months, sooner if problems/concerns.

## 2014-11-07 NOTE — Assessment & Plan Note (Signed)
Uncontrolled. Advised pt to arrange follow up with endocrinology. Obtain A1C.

## 2014-11-07 NOTE — Assessment & Plan Note (Signed)
BP slightly elevated today, but was  Much better last visit, continue current meds. Obtain bmet.

## 2014-11-07 NOTE — Progress Notes (Signed)
Subjective:    Patient ID: [name], male    DOB: Aug 17, 1951, 64 y.o.   MRN: [id number]  HPI  [name] is a 64 yr old male who presents today for follow up.  Patient presents today for follow up of multiple medical problems.  Diabetes Type 2  Pt is currently maintained on the following medications for diabetes:lantus, metformin, januvia and glipizide  Lab Results  Component Value Date   HGBA1C 8.0* 08/08/2014   HGBA1C 8.0* 05/09/2014   HGBA1C 7.8* 02/01/2014    Lab Results  Component Value Date   MICROALBUR 2.8* 05/09/2014   LDLCALC 71 02/01/2014   CREATININE 0.9 08/08/2014    Last diabetic eye exam was performed 12/15- Dr. Delman Cheadle, records not available.  Lab Results  Component Value Date   HMDIABEYEEXA normal 01/18/2010   Denies polyuria/polydipsia. Reports very rare episode hypoglycemia Home glucose readings range 80-115 fasting  Hyperlipidemia  Patient is currently maintained on the following medication for hyperlipidemia: crestor Last lipid panel as follows:  Lab Results  Component Value Date   CHOL 132 02/01/2014   HDL 46 02/01/2014   LDLCALC 71 02/01/2014   LDLDIRECT 177.6 10/07/2007   TRIG 75 02/01/2014   CHOLHDL 2.9 02/01/2014   Patient denies myalgia. Patient reports good compliance with low fat/low cholesterol diet.   Hypertension  Patient is currently maintained on the following medications for blood pressure: lisinopril and nifedipine. Patient reports good compliance with blood pressure medications. Patient denies chest pain, shortness of breath or swelling. Last 3 blood pressure readings in our office are as follows: BP Readings from Last 3 Encounters:  11/07/14 140/92  08/08/14 122/80  06/20/14 136/70         Review of Systems See HPI  Past Medical History  Diagnosis Date  . History of colonic polyps   . Diabetes mellitus type II   . Hypertension   . Hyperlipidemia   . Low back pain   . Breast mass, right  03/2008  . Lipoma   . Abnormal myocardial perfusion study 2006    EF 44% ? inferoseptal ischemia. no cath done  . Contact dermatitis 01/31/2013    History   Social History  . Marital Status: Married    Spouse Name: N/A  . Number of Children: N/A  . Years of Education: N/A   Occupational History  . Not on file.   Social History Main Topics  . Smoking status: Former Research scientist (life sciences)  . Smokeless tobacco: Never Used     Comment: 33 pack year history  . Alcohol Use: No  . Drug Use: No  . Sexual Activity: Not on file   Other Topics Concern  . Not on file   Social History Narrative   Occupation: carpenter   Married    Former Smoker -  33 pack year history   Alcohol use-no     Drug use-no              Past Surgical History  Procedure Laterality Date  . Lipoma excision  07/2008     Dr Gershon Crane    Family History  Problem Relation Age of Onset  . Melanoma Father 15    deceased secondary to melanoma  . Hypertension Mother     alive -46    Allergies  Allergen Reactions  . Amlodipine Besylate     REACTION: ? caused left axillary nodules  . Hydrochlorothiazide     Current Outpatient Prescriptions on File Prior to Visit  Medication  Sig Dispense Refill  . aspirin 81 MG tablet Take 81 mg by mouth daily.      Marland Kitchen glipiZIDE (GLUCOTROL XL) 10 MG 24 hr tablet TAKE ONE TABLET BY MOUTH ONCE DAILY WITH BREAKFAST. 30 tablet 0  . glucose blood (ONE TOUCH TEST STRIPS) test strip Use to check blood sugar three times  a day as instructed Dx Code 250.00 300 each 1  . insulin aspart (NOVOLOG FLEXPEN) 100 UNIT/ML FlexPen Inject 15 Units into the skin daily before supper. 9 mL 3  . Insulin Glargine (LANTUS SOLOSTAR) 100 UNIT/ML Solostar Pen Inject 30 Units into the skin every morning. 15 mL 2  . lisinopril (PRINIVIL,ZESTRIL) 30 MG tablet TAKE ONE TABLET BY MOUTH ONCE DAILY 30 tablet 1  . metFORMIN (GLUCOPHAGE-XR) 500 MG 24 hr tablet TAKE TWO TABLETS BY MOUTH TWICE DAILY 120 tablet 2  . NIFEdipine  (ADALAT CC) 90 MG 24 hr tablet TAKE ONE TABLET BY MOUTH ONCE DAILY 30 tablet 3  . ONE TOUCH ULTRA TEST test strip USE ONE STRIP TO CHECK GLUCOSE THREE TIMES DAILY AS  DIRECTED 300 each 0  . RELION SHORT PEN NEEDLES 31G X 8 MM MISC USE TWICE DAILY FOR INSULIN INJECTION 100 each 2  . rosuvastatin (CRESTOR) 20 MG tablet Take 1 tablet (20 mg total) by mouth daily. 30 tablet 5  . sitaGLIPtin (JANUVIA) 100 MG tablet Take 1 tablet (100 mg total) by mouth daily. 30 tablet 2  . tadalafil (CIALIS) 20 MG tablet One by mouth every 36 hours as needed 6 tablet 11   No current facility-administered medications on file prior to visit.    BP 140/92 mmHg  Pulse 92  Temp(Src) 98 F (36.7 C) (Oral)  Resp 16  Ht 6\' 4"  (1.93 m)  Wt 204 lb (92.534 kg)  BMI 24.84 kg/m2  SpO2 95%       Objective:   Physical Exam  Constitutional: He is oriented to person, place, and time. He appears well-developed and well-nourished. No distress.  HENT:  Head: Normocephalic and atraumatic.  Cardiovascular: Normal rate and regular rhythm.   No murmur heard. Pulmonary/Chest: Effort normal and breath sounds normal. No respiratory distress. He has no wheezes. He has no rales.  Musculoskeletal: He exhibits no edema.  Neurological: He is alert and oriented to person, place, and time.  Skin: Skin is warm and dry.  Psychiatric: He has a normal mood and affect. His behavior is normal. Thought content normal.          Assessment & Plan:

## 2014-11-07 NOTE — Telephone Encounter (Signed)
-----   Message from Debbrah Alar, NP sent at 11/07/2014  7:35 AM EDT ----- Could you please request eye exam from Dr. Delman Cheadle? tks

## 2014-11-07 NOTE — Progress Notes (Signed)
Pre visit review using our clinic review tool, if applicable. No additional management support is needed unless otherwise documented below in the visit note. 

## 2014-11-08 ENCOUNTER — Telehealth: Payer: Self-pay | Admitting: Family

## 2014-11-08 MED ORDER — INSULIN GLARGINE 100 UNIT/ML SOLOSTAR PEN: 35.0000 [IU] | PEN_INJECTOR | Freq: Every morning | SUBCUTANEOUS | Status: DC

## 2014-11-08 NOTE — Telephone Encounter (Signed)
Notified pt and he voices understanding. 

## 2014-11-08 NOTE — Telephone Encounter (Signed)
Please contact pt and let him know that diabetes is above goal. Increase lantus from 30 units to 35 units once daily. He should schedule follow up with Dr. Cruzita Lederer.

## 2014-11-08 NOTE — Telephone Encounter (Signed)
Received note and forwarded to Provider for review.

## 2014-11-22 ENCOUNTER — Encounter: Payer: Self-pay | Admitting: Family

## 2014-11-28 ENCOUNTER — Other Ambulatory Visit: Payer: Self-pay | Admitting: Family

## 2014-11-28 ENCOUNTER — Other Ambulatory Visit: Payer: Self-pay | Admitting: Internal Medicine

## 2014-11-29 ENCOUNTER — Other Ambulatory Visit: Payer: Self-pay | Admitting: Family

## 2014-11-29 NOTE — Telephone Encounter (Signed)
Medication Detail      Disp Refills Start End     RELION SHORT PEN NEEDLES 31G X 8 MM MISC 100 each 0 11/28/2014     Sig: USE TWICE DAILY FOR INSULIN INJECTION    E-Prescribing Status: Receipt confirmed by pharmacy (11/28/2014 4:31 PM EDT)     Pharmacy    [facility] [place]; Last Rx to Pharmacy 04.04.16 #100 with 0 refills/SLS

## 2014-12-11 ENCOUNTER — Other Ambulatory Visit: Payer: Self-pay | Admitting: Family

## 2014-12-11 ENCOUNTER — Other Ambulatory Visit: Payer: Self-pay | Admitting: Internal Medicine

## 2014-12-16 ENCOUNTER — Other Ambulatory Visit: Payer: Self-pay | Admitting: Family

## 2014-12-16 NOTE — Telephone Encounter (Signed)
Medication Detail      Disp Refills Start End     metFORMIN (GLUCOPHAGE-XR) 500 MG 24 hr tablet 120 tablet 2 09/07/2014     Sig: TAKE TWO TABLETS BY MOUTH TWICE DAILY    E-Prescribing Status: Receipt confirmed by pharmacy (09/07/2014 5:34 PM EST)   Pharmacy    [facility] ([place]), [place]   Refill sent per [facility] refill protocol/SLS

## 2015-01-08 ENCOUNTER — Other Ambulatory Visit: Payer: Self-pay | Admitting: Internal Medicine

## 2015-01-12 ENCOUNTER — Telehealth: Payer: Self-pay | Admitting: Family

## 2015-01-12 NOTE — Telephone Encounter (Signed)
Relation to pt: Gingrich,Cathy (spouse) Call back number: [phone] Pharmacy: [facility] [address] ([place]), [place] [address] [phone] (Phone) [phone] (Fax)         Reason for call:   requesting a refill LANTUS SOLOSTAR 100 UNIT/ML Solostar Pen

## 2015-01-13 ENCOUNTER — Other Ambulatory Visit: Payer: Self-pay | Admitting: *Deleted

## 2015-01-13 MED ORDER — INSULIN GLARGINE 100 UNIT/ML SOLOSTAR PEN: 40.0000 [IU] | PEN_INJECTOR | Freq: Every morning | SUBCUTANEOUS | Status: DC

## 2015-01-13 NOTE — Telephone Encounter (Signed)
Notified pharmacist.

## 2015-01-13 NOTE — Telephone Encounter (Signed)
Should be Gherghe please.

## 2015-01-13 NOTE — Telephone Encounter (Signed)
Melissa-- are we managing his diabetes or is Dr Cruzita Lederer?

## 2015-01-23 ENCOUNTER — Other Ambulatory Visit: Payer: Self-pay | Admitting: Internal Medicine

## 2015-01-25 ENCOUNTER — Telehealth: Payer: Self-pay | Admitting: Family

## 2015-01-25 NOTE — Telephone Encounter (Signed)
Pre Visit letter sent  °

## 2015-01-30 ENCOUNTER — Other Ambulatory Visit: Payer: Self-pay | Admitting: *Deleted

## 2015-01-30 MED ORDER — GLIPIZIDE ER 10 MG PO TB24
ORAL_TABLET | ORAL | Status: DC
Start: 2015-01-30 — End: 2015-02-09

## 2015-02-06 ENCOUNTER — Telehealth: Payer: Self-pay | Admitting: Family

## 2015-02-06 ENCOUNTER — Telehealth: Payer: Self-pay | Admitting: *Deleted

## 2015-02-06 MED ORDER — ROSUVASTATIN CALCIUM 20 MG PO TABS: 20.0000 mg | ORAL_TABLET | Freq: Every day | ORAL | Status: DC

## 2015-02-06 MED ORDER — INSULIN PEN NEEDLE 31G X 8 MM MISC: Status: DC

## 2015-02-06 MED ORDER — GLUCOSE BLOOD VI STRP: ORAL_STRIP | Status: DC

## 2015-02-06 NOTE — Telephone Encounter (Signed)
Rx sent 

## 2015-02-06 NOTE — Telephone Encounter (Signed)
Received fax from [facility] for crestor 20mg  and relion pen needles 31G/7mm.  Refilsl sent.

## 2015-02-06 NOTE — Telephone Encounter (Signed)
Caller name: Warner Mccreedy  Call back number: [phone] Pharmacy: [facility]   Reason for call:  Pharmacy requesting a refill glucose blood test strip accuca check. (accuca check is the cheapest co pay)

## 2015-02-09 ENCOUNTER — Telehealth: Payer: Self-pay | Admitting: *Deleted

## 2015-02-09 ENCOUNTER — Encounter: Payer: Self-pay | Admitting: *Deleted

## 2015-02-09 NOTE — Telephone Encounter (Signed)
Pre-Visit Call completed with patient and chart updated.   Pre-Visit Info documented in Specialty Comments under [employer].    

## 2015-02-13 ENCOUNTER — Encounter: Payer: BC Managed Care – PPO | Admitting: Family

## 2015-03-02 ENCOUNTER — Ambulatory Visit (INDEPENDENT_AMBULATORY_CARE_PROVIDER_SITE_OTHER): Payer: BC Managed Care – PPO | Admitting: Family

## 2015-03-02 ENCOUNTER — Encounter: Payer: Self-pay | Admitting: Family

## 2015-03-02 VITALS — BP 130/86 | HR 82 | Temp 98.2°F | Resp 16 | Ht 75.0 in | Wt 203.0 lb

## 2015-03-02 DIAGNOSIS — Z0000 Encounter for general adult medical examination without abnormal findings: Secondary | ICD-10-CM | POA: Diagnosis not present

## 2015-03-02 DIAGNOSIS — IMO0002 Reserved for concepts with insufficient information to code with codable children: Secondary | ICD-10-CM

## 2015-03-02 DIAGNOSIS — E1165 Type 2 diabetes mellitus with hyperglycemia: Secondary | ICD-10-CM

## 2015-03-02 DIAGNOSIS — E1140 Type 2 diabetes mellitus with diabetic neuropathy, unspecified: Secondary | ICD-10-CM

## 2015-03-02 LAB — HEPATIC FUNCTION PANEL
ALT: 18 U/L (ref 0–53)
AST: 15 U/L (ref 0–37)
Albumin: 4.4 g/dL (ref 3.5–5.2)
Alkaline Phosphatase: 49 U/L (ref 39–117)
BILIRUBIN TOTAL: 0.7 mg/dL (ref 0.2–1.2)
Bilirubin, Direct: 0.1 mg/dL (ref 0.0–0.3)
Total Protein: 7.2 g/dL (ref 6.0–8.3)

## 2015-03-02 LAB — URINALYSIS, ROUTINE W REFLEX MICROSCOPIC
BILIRUBIN URINE: NEGATIVE
Hgb urine dipstick: NEGATIVE
KETONES UR: NEGATIVE
Leukocytes, UA: NEGATIVE
Nitrite: NEGATIVE
PH: 7.5 (ref 5.0–8.0)
RBC / HPF: NONE SEEN (ref 0–?)
Specific Gravity, Urine: 1.01 (ref 1.000–1.030)
Total Protein, Urine: NEGATIVE
UROBILINOGEN UA: 1 (ref 0.0–1.0)
Urine Glucose: NEGATIVE
WBC, UA: NONE SEEN (ref 0–?)

## 2015-03-02 LAB — CBC WITH DIFFERENTIAL/PLATELET
Basophils Absolute: 0 10*3/uL (ref 0.0–0.1)
Basophils Relative: 0.5 % (ref 0.0–3.0)
EOS ABS: 0.1 10*3/uL (ref 0.0–0.7)
Eosinophils Relative: 0.8 % (ref 0.0–5.0)
HCT: 46.4 % (ref 39.0–52.0)
HEMOGLOBIN: 14.5 g/dL (ref 13.0–17.0)
Lymphocytes Relative: 23.8 % (ref 12.0–46.0)
Lymphs Abs: 2.1 10*3/uL (ref 0.7–4.0)
MCHC: 31.3 g/dL (ref 30.0–36.0)
MCV: 74 fl — ABNORMAL LOW (ref 78.0–100.0)
Monocytes Absolute: 0.8 10*3/uL (ref 0.1–1.0)
Monocytes Relative: 8.6 % (ref 3.0–12.0)
NEUTROS ABS: 6 10*3/uL (ref 1.4–7.7)
Neutrophils Relative %: 66.3 % (ref 43.0–77.0)
Platelets: 385 10*3/uL (ref 150.0–400.0)
RBC: 6.27 Mil/uL — AB (ref 4.22–5.81)
RDW: 15 % (ref 11.5–15.5)
WBC: 9 10*3/uL (ref 4.0–10.5)

## 2015-03-02 LAB — HEMOGLOBIN A1C: Hgb A1c MFr Bld: 7.8 % — ABNORMAL HIGH (ref 4.6–6.5)

## 2015-03-02 LAB — LIPID PANEL
CHOL/HDL RATIO: 3
Cholesterol: 132 mg/dL (ref 0–200)
HDL: 46.3 mg/dL (ref >39.00)
LDL CALC: 73 mg/dL (ref 0–99)
NonHDL: 85.7
Triglycerides: 64 mg/dL (ref 0.0–149.0)
VLDL: 12.8 mg/dL (ref 0.0–40.0)

## 2015-03-02 LAB — BASIC METABOLIC PANEL
BUN: 11 mg/dL (ref 6–23)
CALCIUM: 9.9 mg/dL (ref 8.4–10.5)
CO2: 27 mEq/L (ref 19–32)
Chloride: 102 mEq/L (ref 96–112)
Creatinine, Ser: 0.79 mg/dL (ref 0.40–1.50)
GFR: 127.19 mL/min (ref >60.00)
Glucose, Bld: 95 mg/dL (ref 70–99)
POTASSIUM: 4.5 meq/L (ref 3.5–5.1)
Sodium: 138 mEq/L (ref 135–145)

## 2015-03-02 LAB — PSA: PSA: 0.29 ng/mL (ref 0.10–4.00)

## 2015-03-02 LAB — MICROALBUMIN / CREATININE URINE RATIO
CREATININE, U: 78.2 mg/dL
Microalb Creat Ratio: 0.9 mg/g (ref 0.0–30.0)
Microalb, Ur: <0.7 mg/dL (ref 0.0–1.9)

## 2015-03-02 LAB — HIV ANTIBODY (ROUTINE TESTING W REFLEX): HIV: NONREACTIVE

## 2015-03-02 LAB — TSH: TSH: 0.93 u[IU]/mL (ref 0.35–4.50)

## 2015-03-02 MED ORDER — NIFEDIPINE ER 90 MG PO TB24: 90.0000 mg | ORAL_TABLET | Freq: Every day | ORAL | Status: DC

## 2015-03-02 MED ORDER — LISINOPRIL 30 MG PO TABS: 30.0000 mg | ORAL_TABLET | Freq: Every day | ORAL | Status: DC

## 2015-03-02 MED ORDER — METFORMIN HCL ER 500 MG PO TB24: 1000.0000 mg | ORAL_TABLET | Freq: Two times a day (BID) | ORAL | Status: DC

## 2015-03-02 NOTE — Patient Instructions (Addendum)
Please contact your insurance and check to see if they cover shingles vaccine and if so, please schedule a nurse visit so we can give it to you. Try to add 30 minutes of exercise 5 days a week such as walking.

## 2015-03-02 NOTE — Progress Notes (Signed)
Subjective:    Patient ID: [name], male    DOB: 04-20-1951, 64 y.o.   MRN: [id number]  HPI  Patient presents today for complete physical.  Immunizations:due for shingles.  Diet: reports diet is healthy Exercise:not regular exercise Colonoscopy: 11/2005 Dental: up to date Eye exam- up to date, due in 12/16  Review of Systems  Constitutional: Negative for unexpected weight change.  HENT: Negative for rhinorrhea.   Eyes: Negative for visual disturbance.  Respiratory: Negative for cough and shortness of breath.   Cardiovascular: Negative for chest pain.  Gastrointestinal: Negative for nausea, diarrhea and blood in stool.  Genitourinary: Negative for dysuria and frequency.  Musculoskeletal: Negative for arthralgias.       Occasional leg cramping in his legs during heat if he squats frequently at work- worse in the heat.   Skin: Negative for rash.  Neurological: Negative for headaches.  Hematological: Negative for adenopathy.  Psychiatric/Behavioral:       Denies depression/anxiety   Past Medical History  Diagnosis Date  . History of colonic polyps   . Diabetes mellitus type II   . Hypertension   . Hyperlipidemia   . Low back pain   . Breast mass, right 03/2008  . Lipoma   . Abnormal myocardial perfusion study 2006    EF 44% ? inferoseptal ischemia. no cath done  . Contact dermatitis 01/31/2013    History   Social History  . Marital Status: Married    Spouse Name: N/A  . Number of Children: N/A  . Years of Education: N/A   Occupational History  . Not on file.   Social History Main Topics  . Smoking status: Former Research scientist (life sciences)  . Smokeless tobacco: Never Used     Comment: 33 pack year history  . Alcohol Use: No  . Drug Use: No  . Sexual Activity: Not on file   Other Topics Concern  . Not on file   Social History Narrative   Occupation: carpenter   Married    Former Smoker -  33 pack year history   Alcohol use-no     Drug use-no               Past Surgical History  Procedure Laterality Date  . Lipoma excision  07/2008     Dr Gershon Crane    Family History  Problem Relation Age of Onset  . Melanoma Father 33    deceased secondary to melanoma  . Alzheimer's disease Father   . Diabetes Father   . Hypertension Mother     alive -54    Allergies  Allergen Reactions  . Amlodipine Besylate     REACTION: ? caused left axillary nodules  . Hydrochlorothiazide Other (See Comments)    ?unknown reaction    Current Outpatient Prescriptions on File Prior to Visit  Medication Sig Dispense Refill  . aspirin 81 MG tablet Take 81 mg by mouth daily.      . Cyanocobalamin (VITAMIN B-12 PO) Take 1 tablet by mouth daily.    Marland Kitchen glipiZIDE (GLUCOTROL XL) 10 MG 24 hr tablet TAKE ONE TABLET BY MOUTH ONCE DAILY WITH  BREAKFAST 30 tablet 0  . glucose blood (ACCU-CHEK AVIVA) test strip Use as instructed to check blood sugar three times daily.  DX E11.40 300 each 1  . insulin aspart (NOVOLOG FLEXPEN) 100 UNIT/ML FlexPen Inject 15 Units into the skin daily before supper. 9 mL 3  . Insulin Glargine (LANTUS SOLOSTAR) 100 UNIT/ML Solostar Pen Inject 40  Units into the skin every morning. 15 mL 2  . Insulin Pen Needle (RELION SHORT PEN NEEDLES) 31G X 8 MM MISC USE TWICE DAILY FOR INSULIN INJECTION 100 each 1  . lisinopril (PRINIVIL,ZESTRIL) 30 MG tablet TAKE ONE TABLET BY MOUTH ONCE DAILY 30 tablet 2  . metFORMIN (GLUCOPHAGE-XR) 500 MG 24 hr tablet TAKE TWO TABLETS BY MOUTH TWICE DAILY 120 tablet 2  . NIFEdipine (ADALAT CC) 90 MG 24 hr tablet TAKE ONE TABLET BY MOUTH ONCE DAILY 30 tablet 3  . rosuvastatin (CRESTOR) 20 MG tablet Take 1 tablet (20 mg total) by mouth daily. 30 tablet 5  . sitaGLIPtin (JANUVIA) 100 MG tablet Take 1 tablet (100 mg total) by mouth daily. 30 tablet 2  . tadalafil (CIALIS) 20 MG tablet One by mouth every 36 hours as needed 6 tablet 11   No current facility-administered medications on file prior to visit.    BP 130/86 mmHg   Pulse 82  Temp(Src) 98.2 F (36.8 C) (Oral)  Resp 16  Ht 6\' 3"  (1.905 m)  Wt 203 lb (92.08 kg)  BMI 25.37 kg/m2  SpO2 97%       Objective:   Physical Exam   Physical Exam  Constitutional: He is oriented to person, place, and time. He appears well-developed and well-nourished. No distress.  HENT:  Head: Normocephalic and atraumatic.  Right Ear: Tympanic membrane and ear canal normal.  Left Ear: Tympanic membrane and ear canal normal.  Mouth/Throat: Oropharynx is clear and moist.  Eyes: Pupils are equal, round, and reactive to light. No scleral icterus.  Neck: Normal range of motion. No thyromegaly present.  Cardiovascular: Normal rate and regular rhythm.   No murmur heard. Pulmonary/Chest: Effort normal and breath sounds normal. No respiratory distress. He has no wheezes. He has no rales. He exhibits no tenderness.  Abdominal: Soft. Bowel sounds are normal. He exhibits no distension and no mass. There is no tenderness. There is no rebound and no guarding.  Musculoskeletal: He exhibits no edema.  Lymphadenopathy:    He has no cervical adenopathy.  Neurological: He is alert and oriented to person, place, and time. He has normal patellar reflexes. He exhibits normal muscle tone. Coordination normal.  Skin: Skin is warm and dry.  Psychiatric: He has a normal mood and affect. His behavior is normal. Judgment and thought content normal.          Assessment & Plan:   EKG tracing is personally reviewed.  EKG notes NSR.  No acute changes. Compared to EKG on file 2013 and appears unchanged.         Assessment & Plan:

## 2015-03-02 NOTE — Progress Notes (Signed)
Pre visit review using our clinic review tool, if applicable. No additional management support is needed unless otherwise documented below in the visit note. 

## 2015-03-02 NOTE — Assessment & Plan Note (Addendum)
Immunizations reviewed and pt is due for zostavax- pt is instructed to contact his insurance to verify coverage of zostavax, then contact us to arrange nurse visit for administration.  Continue healthy diet, exercise.  Obtain routine labs. Pt would like to proceed with PSA, discussed pros/cons.

## 2015-03-02 NOTE — Assessment & Plan Note (Signed)
Obtain follow up A1C, urine microalbumin.

## 2015-03-05 ENCOUNTER — Telehealth: Payer: Self-pay | Admitting: Family

## 2015-03-05 MED ORDER — CANAGLIFLOZIN 100 MG PO TABS: 100.0000 mg | ORAL_TABLET | Freq: Every day | ORAL | Status: DC

## 2015-03-05 NOTE — Telephone Encounter (Signed)
Sugar above goal.  Add invokana, work on diabetic diet. HIV neg, PSA test normal, cholesterol looks great.

## 2015-03-07 NOTE — Telephone Encounter (Signed)
Notified pt and he voices understanding. 

## 2015-03-17 ENCOUNTER — Other Ambulatory Visit: Payer: Self-pay | Admitting: Internal Medicine

## 2015-04-21 ENCOUNTER — Other Ambulatory Visit: Payer: Self-pay | Admitting: Internal Medicine

## 2015-05-08 ENCOUNTER — Other Ambulatory Visit: Payer: Self-pay | Admitting: Family

## 2015-06-07 ENCOUNTER — Encounter: Payer: Self-pay | Admitting: Family

## 2015-06-07 ENCOUNTER — Ambulatory Visit (INDEPENDENT_AMBULATORY_CARE_PROVIDER_SITE_OTHER): Payer: BC Managed Care – PPO | Admitting: Family

## 2015-06-07 VITALS — BP 145/80 | HR 80 | Temp 98.0°F | Resp 16 | Ht 75.0 in | Wt 198.6 lb

## 2015-06-07 DIAGNOSIS — Z0000 Encounter for general adult medical examination without abnormal findings: Secondary | ICD-10-CM

## 2015-06-07 DIAGNOSIS — E1310 Other specified diabetes mellitus with ketoacidosis without coma: Secondary | ICD-10-CM

## 2015-06-07 DIAGNOSIS — Z23 Encounter for immunization: Secondary | ICD-10-CM | POA: Diagnosis not present

## 2015-06-07 DIAGNOSIS — E1110 Type 2 diabetes mellitus with ketoacidosis without coma: Secondary | ICD-10-CM

## 2015-06-07 LAB — BASIC METABOLIC PANEL
BUN: 14 mg/dL (ref 6–23)
CALCIUM: 9.4 mg/dL (ref 8.4–10.5)
CO2: 25 mEq/L (ref 19–32)
Chloride: 106 mEq/L (ref 96–112)
Creatinine, Ser: 0.85 mg/dL (ref 0.40–1.50)
GFR: 116.79 mL/min (ref >60.00)
GLUCOSE: 73 mg/dL (ref 70–99)
POTASSIUM: 3.9 meq/L (ref 3.5–5.1)
Sodium: 141 mEq/L (ref 135–145)

## 2015-06-07 LAB — HEMOGLOBIN A1C: Hgb A1c MFr Bld: 7.3 % — ABNORMAL HIGH (ref 4.6–6.5)

## 2015-06-07 NOTE — Patient Instructions (Signed)
Please complete lab work prior to leaving. Follow up in 3 months.  

## 2015-06-07 NOTE — Assessment & Plan Note (Signed)
At goal on statin, continue same.

## 2015-06-07 NOTE — Assessment & Plan Note (Signed)
Reports good sugars at home. Reminded pt not to skip meals to avoid hypoglycemia. Continue current meds. Flu shot today.  Obtain A1C, BMET

## 2015-06-07 NOTE — Assessment & Plan Note (Signed)
BP stable.

## 2015-06-07 NOTE — Progress Notes (Signed)
Subjective:    Patient ID: [name], male    DOB: 04-16-1951, 64 y.o.   MRN: [id number]  HPI  [name] is a 64 yr old male who presents today for follow up of multiple medical problems.  Patient presents today for follow up of multiple medical problems.  DM2- Maintained on glucotrol, metformin, novolog and lantus. Pt reports sugars 90-130. He reports that he has had a few low blood sugars, but this occurs when he has missed meals.   Lab Results  Component Value Date   HGBA1C 7.8* 03/02/2015   HGBA1C 8.0* 11/07/2014   HGBA1C 8.0* 08/08/2014   Lab Results  Component Value Date   MICROALBUR <0.7 03/02/2015   LDLCALC 73 03/02/2015   CREATININE 0.79 03/02/2015   HTN- on nifedipine, lisinopril.  Denies CP/sob or swelling.  BP Readings from Last 3 Encounters:  06/07/15 145/80  03/02/15 130/86  11/07/14 140/92   Hyperlipidemia- maintained on crestor.  Notes some occasional leg cramps but not severe.  Lab Results  Component Value Date   CHOL 132 03/02/2015   HDL 46.30 03/02/2015   LDLCALC 73 03/02/2015   LDLDIRECT 177.6 10/07/2007   TRIG 64.0 03/02/2015   CHOLHDL 3 03/02/2015     Review of Systems See HPI  Past Medical History  Diagnosis Date  . History of colonic polyps   . Diabetes mellitus type II   . Hypertension   . Hyperlipidemia   . Low back pain   . Breast mass, right 03/2008  . Lipoma   . Abnormal myocardial perfusion study 2006    EF 44% ? inferoseptal ischemia. no cath done  . Contact dermatitis 01/31/2013    Social History   Social History  . Marital Status: Married    Spouse Name: N/A  . Number of Children: N/A  . Years of Education: N/A   Occupational History  . Not on file.   Social History Main Topics  . Smoking status: Former Research scientist (life sciences)  . Smokeless tobacco: Never Used     Comment: 33 pack year history  . Alcohol Use: No  . Drug Use: No  . Sexual Activity: Not on file   Other Topics Concern  . Not on file   Social  History Narrative   Occupation: carpenter   Married    Former Smoker -  33 pack year history   Alcohol use-no     Drug use-no              Past Surgical History  Procedure Laterality Date  . Lipoma excision  07/2008     Dr Gershon Crane    Family History  Problem Relation Age of Onset  . Melanoma Father 80    deceased secondary to melanoma  . Alzheimer's disease Father   . Diabetes Father   . Hypertension Mother     alive -23    Allergies  Allergen Reactions  . Amlodipine Besylate     REACTION: ? caused left axillary nodules  . Hydrochlorothiazide Other (See Comments)    ?unknown reaction    Current Outpatient Prescriptions on File Prior to Visit  Medication Sig Dispense Refill  . aspirin 81 MG tablet Take 81 mg by mouth daily.      . canagliflozin (INVOKANA) 100 MG TABS tablet Take 1 tablet (100 mg total) by mouth daily. 30 tablet 5  . Cyanocobalamin (VITAMIN B-12 PO) Take 1 tablet by mouth daily.    Marland Kitchen glipiZIDE (GLUCOTROL XL) 10 MG 24 hr  tablet TAKE ONE TABLET BY MOUTH ONCE DAILY WITH  BREAKFAST 30 tablet 0  . glucose blood (ACCU-CHEK AVIVA) test strip Use as instructed to check blood sugar three times daily.  DX E11.40 300 each 1  . insulin aspart (NOVOLOG FLEXPEN) 100 UNIT/ML FlexPen Inject 15 Units into the skin daily before supper. 9 mL 3  . Insulin Glargine (LANTUS SOLOSTAR) 100 UNIT/ML Solostar Pen Inject 40 Units into the skin every morning. 15 mL 2  . JANUVIA 100 MG tablet TAKE ONE TABLET BY MOUTH ONCE DAILY 30 tablet 1  . lisinopril (PRINIVIL,ZESTRIL) 30 MG tablet Take 1 tablet (30 mg total) by mouth daily. 90 tablet 1  . metFORMIN (GLUCOPHAGE-XR) 500 MG 24 hr tablet Take 2 tablets (1,000 mg total) by mouth 2 (two) times daily. 360 tablet 1  . NIFEdipine (ADALAT CC) 90 MG 24 hr tablet Take 1 tablet (90 mg total) by mouth daily. 90 tablet 1  . RELION SHORT PEN NEEDLES 31G X 8 MM MISC USE ONE NEEDLE TWICE DAILY 100 each 2  . rosuvastatin (CRESTOR) 20 MG tablet Take 1  tablet (20 mg total) by mouth daily. 30 tablet 5  . tadalafil (CIALIS) 20 MG tablet One by mouth every 36 hours as needed 6 tablet 11   No current facility-administered medications on file prior to visit.    BP 145/80 mmHg  Pulse 80  Temp(Src) 98 F (36.7 C) (Oral)  Resp 16  Ht 6\' 3"  (1.905 m)  Wt 198 lb 9.6 oz (90.084 kg)  BMI 24.82 kg/m2  SpO2 98%       Objective:   Physical Exam  Constitutional: He is oriented to person, place, and time. He appears well-developed and well-nourished. No distress.  HENT:  Head: Normocephalic and atraumatic.  Cardiovascular: Normal rate and regular rhythm.   No murmur heard. Pulmonary/Chest: Effort normal and breath sounds normal. No respiratory distress. He has no wheezes. He has no rales.  Musculoskeletal: He exhibits no edema.  Neurological: He is alert and oriented to person, place, and time.  Skin: Skin is warm and dry.  Psychiatric: He has a normal mood and affect. His behavior is normal. Thought content normal.          Assessment & Plan:

## 2015-06-07 NOTE — Progress Notes (Signed)
Pre visit review using our clinic review tool, if applicable. No additional management support is needed unless otherwise documented below in the visit note. 

## 2015-06-08 LAB — HEPATITIS C ANTIBODY: HCV AB: NEGATIVE

## 2015-06-10 ENCOUNTER — Encounter: Payer: Self-pay | Admitting: Family

## 2015-06-21 ENCOUNTER — Other Ambulatory Visit: Payer: Self-pay | Admitting: Internal Medicine

## 2015-07-06 ENCOUNTER — Other Ambulatory Visit: Payer: Self-pay | Admitting: Internal Medicine

## 2015-07-08 ENCOUNTER — Other Ambulatory Visit: Payer: Self-pay | Admitting: Internal Medicine

## 2015-07-17 ENCOUNTER — Other Ambulatory Visit: Payer: Self-pay | Admitting: Internal Medicine

## 2015-07-18 ENCOUNTER — Other Ambulatory Visit: Payer: Self-pay | Admitting: Internal Medicine

## 2015-07-25 ENCOUNTER — Telehealth: Payer: Self-pay | Admitting: Family

## 2015-07-25 NOTE — Telephone Encounter (Signed)
Caller name: [name]   Relationship to patient: Spouse   Can be reached: [phone]  Pharmacy: [facility] ([place]), [place] - [place]  Reason for call: called in to request a refill on his LANTUS Rx. Informed pt that Im not showing PCP to be prescribing provider and also showing 2 refills remaining.   Please advise further.   Thanks.

## 2015-07-25 NOTE — Telephone Encounter (Signed)
Left detailed message on pt's voicemail that last refills came from endocrinologist in may and appears that he needs follow up with Dr Cruzita Lederer. Advised her to contact endocrinology office for refill and follow up.

## 2015-07-26 ENCOUNTER — Other Ambulatory Visit: Payer: Self-pay | Admitting: Internal Medicine

## 2015-07-28 ENCOUNTER — Telehealth: Payer: Self-pay | Admitting: Internal Medicine

## 2015-07-28 MED ORDER — INSULIN GLARGINE 100 UNIT/ML SOLOSTAR PEN: 40.0000 [IU] | PEN_INJECTOR | Freq: Every morning | SUBCUTANEOUS | Status: DC

## 2015-07-28 NOTE — Telephone Encounter (Signed)
Refill sent for 1 mo to pharmacy.

## 2015-07-28 NOTE — Telephone Encounter (Signed)
Pt was lost for f/u. Please refill for 1 mo and then schedule a new appt or direct further refills to PCP.

## 2015-07-28 NOTE — Telephone Encounter (Signed)
Pt wife called to f/u on RX for lantus stating pt needs it today. I told her below. She said the pharmacy has been trying to get the RX for 2 weeks and she needs PCP to order. Talked with Gilmore Laroche and she states pt was referred to Dr. Cruzita Lederer at endocrinology for [facility] of diabetes and diabetes medications and for her to contact them. I provided wife with Dr. Arman Filter phone #.

## 2015-07-28 NOTE — Telephone Encounter (Signed)
Error

## 2015-07-28 NOTE — Telephone Encounter (Signed)
Please advise if ok to refill. Pt has not been seen since 06/20/14.

## 2015-07-28 NOTE — Telephone Encounter (Signed)
Please call in to [facility] on [address] he is completely out of his lantus please do asap

## 2015-08-04 ENCOUNTER — Other Ambulatory Visit: Payer: Self-pay | Admitting: Internal Medicine

## 2015-08-09 ENCOUNTER — Other Ambulatory Visit: Payer: Self-pay | Admitting: Family

## 2015-08-16 LAB — HM DIABETES EYE EXAM

## 2015-08-28 ENCOUNTER — Other Ambulatory Visit: Payer: Self-pay | Admitting: Internal Medicine

## 2015-08-29 NOTE — Telephone Encounter (Signed)
We have not seen patient since Oct 2015. Pt has not scheduled a follow up appt. By policy, I cannot refill.

## 2015-08-30 NOTE — Telephone Encounter (Signed)
Melissa-- please advise Lantus request.

## 2015-09-04 ENCOUNTER — Other Ambulatory Visit: Payer: Self-pay | Admitting: Internal Medicine

## 2015-09-04 NOTE — Telephone Encounter (Signed)
Melissa--FYI 30 day supply sent to pharmacy.  Pt has f/u with PCP 09/08/15 and will discuss at that time.

## 2015-09-04 NOTE — Telephone Encounter (Signed)
Patient has not been seen by Dr Cruzita Lederer since 06/20/2014. We will not refill.

## 2015-09-08 ENCOUNTER — Encounter: Payer: Self-pay | Admitting: Family

## 2015-09-08 ENCOUNTER — Ambulatory Visit (INDEPENDENT_AMBULATORY_CARE_PROVIDER_SITE_OTHER): Payer: BC Managed Care – PPO | Admitting: Family

## 2015-09-08 VITALS — BP 126/78 | HR 78 | Temp 98.3°F | Resp 18 | Ht 75.0 in | Wt 201.8 lb

## 2015-09-08 DIAGNOSIS — I10 Essential (primary) hypertension: Secondary | ICD-10-CM | POA: Diagnosis not present

## 2015-09-08 DIAGNOSIS — E1140 Type 2 diabetes mellitus with diabetic neuropathy, unspecified: Secondary | ICD-10-CM | POA: Diagnosis not present

## 2015-09-08 DIAGNOSIS — Z794 Long term (current) use of insulin: Secondary | ICD-10-CM

## 2015-09-08 DIAGNOSIS — E119 Type 2 diabetes mellitus without complications: Secondary | ICD-10-CM

## 2015-09-08 DIAGNOSIS — E1165 Type 2 diabetes mellitus with hyperglycemia: Secondary | ICD-10-CM

## 2015-09-08 DIAGNOSIS — IMO0002 Reserved for concepts with insufficient information to code with codable children: Secondary | ICD-10-CM

## 2015-09-08 DIAGNOSIS — E785 Hyperlipidemia, unspecified: Secondary | ICD-10-CM

## 2015-09-08 LAB — BASIC METABOLIC PANEL
BUN: 16 mg/dL (ref 6–23)
CALCIUM: 9.5 mg/dL (ref 8.4–10.5)
CHLORIDE: 106 meq/L (ref 96–112)
CO2: 27 meq/L (ref 19–32)
CREATININE: 0.9 mg/dL (ref 0.40–1.50)
GFR: 109.24 mL/min (ref >60.00)
Glucose, Bld: 103 mg/dL — ABNORMAL HIGH (ref 70–99)
Potassium: 4.2 mEq/L (ref 3.5–5.1)
Sodium: 142 mEq/L (ref 135–145)

## 2015-09-08 LAB — LIPID PANEL
CHOL/HDL RATIO: 3
CHOLESTEROL: 142 mg/dL (ref 0–200)
HDL: 45.8 mg/dL (ref >39.00)
LDL CALC: 85 mg/dL (ref 0–99)
NONHDL: 96.39
Triglycerides: 56 mg/dL (ref 0.0–149.0)
VLDL: 11.2 mg/dL (ref 0.0–40.0)

## 2015-09-08 LAB — HEMOGLOBIN A1C: Hgb A1c MFr Bld: 7.7 % — ABNORMAL HIGH (ref 4.6–6.5)

## 2015-09-08 MED ORDER — CANAGLIFLOZIN 100 MG PO TABS: 100.0000 mg | ORAL_TABLET | Freq: Every day | ORAL | Status: DC

## 2015-09-08 MED ORDER — LISINOPRIL 30 MG PO TABS: 30.0000 mg | ORAL_TABLET | Freq: Every day | ORAL | Status: DC

## 2015-09-08 MED ORDER — INSULIN ASPART 100 UNIT/ML FLEXPEN: 11.0000 [IU] | PEN_INJECTOR | Freq: Every day | SUBCUTANEOUS | Status: DC

## 2015-09-08 MED ORDER — GLIPIZIDE ER 10 MG PO TB24: ORAL_TABLET | ORAL | Status: DC

## 2015-09-08 MED ORDER — METFORMIN HCL ER 500 MG PO TB24: 1000.0000 mg | ORAL_TABLET | Freq: Two times a day (BID) | ORAL | Status: DC

## 2015-09-08 NOTE — Assessment & Plan Note (Signed)
Reported sugars good despite being off of [place].  Obtain a1c. Pt reports normal eye exam 12/16.

## 2015-09-08 NOTE — Progress Notes (Signed)
Subjective:    Patient ID: [name], male    DOB: Sep 04, 1950, 65 y.o.   MRN: [id number]  HPI  [name] is a 65 yr old male who presents today for follow up.  1) DM2- Patient is currently on glipizide, lantus 40 units, invokana, not taking [place] "they would not refill it." fasting sugars AM 90-115.  HS 130.   Lab Results  Component Value Date   HGBA1C 7.3* 06/07/2015   HGBA1C 7.8* 03/02/2015   HGBA1C 8.0* 11/07/2014   Lab Results  Component Value Date   MICROALBUR <0.7 03/02/2015   LDLCALC 73 03/02/2015   CREATININE 0.85 06/07/2015   2) HTN- Maintained on lisinopril.  BP Readings from Last 3 Encounters:  09/08/15 126/78  06/07/15 145/80  03/02/15 130/86   3) Hyperlipidemia- maintained on crestor 20mg . Denies myalgia.  Occasional muscle cramps- mild.   Lab Results  Component Value Date   CHOL 132 03/02/2015   HDL 46.30 03/02/2015   LDLCALC 73 03/02/2015   LDLDIRECT 177.6 10/07/2007   TRIG 64.0 03/02/2015   CHOLHDL 3 03/02/2015       Review of Systems  Respiratory: Negative for shortness of breath.   Cardiovascular: Negative for chest pain and leg swelling.   Past Medical History  Diagnosis Date  . History of colonic polyps   . Diabetes mellitus type II   . Hypertension   . Hyperlipidemia   . Low back pain   . Breast mass, right 03/2008  . Lipoma   . Abnormal myocardial perfusion study 2006    EF 44% ? inferoseptal ischemia. no cath done  . Contact dermatitis 01/31/2013    Social History   Social History  . Marital Status: Married    Spouse Name: N/A  . Number of Children: N/A  . Years of Education: N/A   Occupational History  . Not on file.   Social History Main Topics  . Smoking status: Former Research scientist (life sciences)  . Smokeless tobacco: Never Used     Comment: 33 pack year history  . Alcohol Use: No  . Drug Use: No  . Sexual Activity: Not on file   Other Topics Concern  . Not on file   Social History Narrative   Occupation: carpenter    Married    Former Smoker -  33 pack year history   Alcohol use-no     Drug use-no              Past Surgical History  Procedure Laterality Date  . Lipoma excision  07/2008     Dr Gershon Crane    Family History  Problem Relation Age of Onset  . Melanoma Father 82    deceased secondary to melanoma  . Alzheimer's disease Father   . Diabetes Father   . Hypertension Mother     alive -68    Allergies  Allergen Reactions  . Amlodipine Besylate     REACTION: ? caused left axillary nodules  . Hydrochlorothiazide Other (See Comments)    ?unknown reaction    Current Outpatient Prescriptions on File Prior to Visit  Medication Sig Dispense Refill  . aspirin 81 MG tablet Take 81 mg by mouth daily.      . canagliflozin (INVOKANA) 100 MG TABS tablet Take 1 tablet (100 mg total) by mouth daily. 30 tablet 5  . Cyanocobalamin (VITAMIN B-12 PO) Take 1 tablet by mouth daily.    Marland Kitchen glipiZIDE (GLUCOTROL XL) 10 MG 24 hr tablet TAKE ONE TABLET BY  MOUTH ONCE DAILY WITH BREAKFAST. MUST HAVE OFFICE VISIT BEFORE FURTHER REFILLS. 30 tablet 0  . insulin aspart (NOVOLOG FLEXPEN) 100 UNIT/ML FlexPen Inject 15 Units into the skin daily before supper. 9 mL 3  . LANTUS SOLOSTAR 100 UNIT/ML Solostar Pen INJECT 40 UNITS SUBCUTANEOUSLY EVERY MORNING 15 pen 0  . lisinopril (PRINIVIL,ZESTRIL) 30 MG tablet Take 1 tablet (30 mg total) by mouth daily. 90 tablet 1  . metFORMIN (GLUCOPHAGE-XR) 500 MG 24 hr tablet Take 2 tablets (1,000 mg total) by mouth 2 (two) times daily. 360 tablet 1  . NIFEdipine (ADALAT CC) 90 MG 24 hr tablet Take 1 tablet (90 mg total) by mouth daily. 90 tablet 1  . RELION SHORT PEN NEEDLES 31G X 8 MM MISC USE ONE NEEDLE TWICE DAILY 100 each 2  . rosuvastatin (CRESTOR) 20 MG tablet TAKE ONE TABLET BY MOUTH ONCE DAILY 30 tablet 5  . tadalafil (CIALIS) 20 MG tablet One by mouth every 36 hours as needed 6 tablet 11   No current facility-administered medications on file prior to visit.    BP 126/78  mmHg  Pulse 78  Temp(Src) 98.3 F (36.8 C) (Oral)  Resp 18  Ht 6\' 3"  (1.905 m)  Wt 201 lb 12.8 oz (91.536 kg)  BMI 25.22 kg/m2  SpO2 97%       Objective:   Physical Exam  Constitutional: He is oriented to person, place, and time. He appears well-developed and well-nourished. No distress.  HENT:  Head: Normocephalic and atraumatic.  Cardiovascular: Normal rate and regular rhythm.   No murmur heard. Pulmonary/Chest: Effort normal and breath sounds normal. No respiratory distress. He has no wheezes. He has no rales.  Musculoskeletal: He exhibits no edema.  Neurological: He is alert and oriented to person, place, and time.  Skin: Skin is warm and dry.  Psychiatric: He has a normal mood and affect. His behavior is normal. Thought content normal.          Assessment & Plan:

## 2015-09-08 NOTE — Assessment & Plan Note (Signed)
BP stable on current meds.   

## 2015-09-08 NOTE — Progress Notes (Signed)
Pre visit review using our clinic review tool, if applicable. No additional management support is needed unless otherwise documented below in the visit note. 

## 2015-09-08 NOTE — Assessment & Plan Note (Signed)
LDL at goal, continue crestor.  

## 2015-09-08 NOTE — Patient Instructions (Signed)
Please complete lab work prior to leaving.   

## 2015-09-09 ENCOUNTER — Telehealth: Payer: Self-pay | Admitting: Family

## 2015-09-09 NOTE — Telephone Encounter (Signed)
Cholesterol looks good.  Sugar above goal.  Please advise patient to arrange follow up with Dr. Cruzita Lederer for his sugar.

## 2015-09-11 ENCOUNTER — Telehealth: Payer: Self-pay | Admitting: *Deleted

## 2015-09-11 NOTE — Telephone Encounter (Signed)
PA initiated through [email or web address], awaiting determination. JG//CMA 

## 2015-09-11 NOTE — Telephone Encounter (Signed)
Completed PA and received the response that PA is not needed. Pharmacy aware. JG//CMA

## 2015-09-11 NOTE — Telephone Encounter (Signed)
Notified pt and he voices understanding. 

## 2015-09-12 ENCOUNTER — Other Ambulatory Visit: Payer: Self-pay | Admitting: Family

## 2015-09-15 NOTE — Telephone Encounter (Signed)
Approved effective 09/13/15-09/12/16. Sent for scanning. JG//CMA

## 2015-09-30 ENCOUNTER — Other Ambulatory Visit: Payer: Self-pay | Admitting: Family

## 2015-10-06 ENCOUNTER — Telehealth: Payer: Self-pay | Admitting: *Deleted

## 2015-10-06 MED ORDER — INSULIN DETEMIR 100 UNIT/ML FLEXPEN: 40.0000 [IU] | PEN_INJECTOR | Freq: Every day | SUBCUTANEOUS | Status: DC

## 2015-10-06 NOTE — Telephone Encounter (Signed)
D/c lantus, start levemir.

## 2015-10-06 NOTE — Telephone Encounter (Signed)
Received notice via covermymeds that lantus requires PA.  Formulary alternatives are:  Levemir, Alysia Penna.  Please advise if one of these would be an appropriate alternative.

## 2015-10-06 NOTE — Telephone Encounter (Signed)
Notified pt's spouse and she voices understanding. Also states that invokana is going to cost pt $300 and will not be able to afford medication. Advised her we have co-pay card if she can pick it up. Card placed at front desk, spouse will pick up card.

## 2015-11-04 ENCOUNTER — Other Ambulatory Visit: Payer: Self-pay | Admitting: Family

## 2015-12-12 ENCOUNTER — Ambulatory Visit (INDEPENDENT_AMBULATORY_CARE_PROVIDER_SITE_OTHER): Payer: BC Managed Care – PPO | Admitting: Family

## 2015-12-12 ENCOUNTER — Telehealth: Payer: Self-pay | Admitting: Family

## 2015-12-12 VITALS — BP 130/77 | HR 81 | Temp 98.4°F | Resp 16 | Ht 75.0 in | Wt 202.0 lb

## 2015-12-12 DIAGNOSIS — E119 Type 2 diabetes mellitus without complications: Secondary | ICD-10-CM

## 2015-12-12 DIAGNOSIS — IMO0002 Reserved for concepts with insufficient information to code with codable children: Secondary | ICD-10-CM

## 2015-12-12 DIAGNOSIS — I10 Essential (primary) hypertension: Secondary | ICD-10-CM | POA: Diagnosis not present

## 2015-12-12 DIAGNOSIS — E785 Hyperlipidemia, unspecified: Secondary | ICD-10-CM | POA: Diagnosis not present

## 2015-12-12 DIAGNOSIS — E1140 Type 2 diabetes mellitus with diabetic neuropathy, unspecified: Secondary | ICD-10-CM

## 2015-12-12 DIAGNOSIS — E1165 Type 2 diabetes mellitus with hyperglycemia: Secondary | ICD-10-CM

## 2015-12-12 LAB — HEMOGLOBIN A1C: HEMOGLOBIN A1C: 8.8 % — AB (ref 4.6–6.5)

## 2015-12-12 MED ORDER — INSULIN DETEMIR 100 UNIT/ML FLEXPEN: 45.0000 [IU] | PEN_INJECTOR | Freq: Every day | SUBCUTANEOUS | Status: DC

## 2015-12-12 NOTE — Patient Instructions (Signed)
Please continue current medications. Complete lab work prior to leaving. Follow up in 3 months.

## 2015-12-12 NOTE — Assessment & Plan Note (Signed)
LDL at goal, continue statin. 

## 2015-12-12 NOTE — Telephone Encounter (Signed)
A1c is above goal. Increase levemir from 40 to 45 units.

## 2015-12-12 NOTE — Progress Notes (Signed)
Pre visit review using our clinic review tool, if applicable. No additional management support is needed unless otherwise documented below in the visit note. 

## 2015-12-12 NOTE — Assessment & Plan Note (Signed)
Home sugars stable. Ok to remain off of invokana.  Obtain a1c.

## 2015-12-12 NOTE — Progress Notes (Signed)
Subjective:    Patient ID: [name], male    DOB: 06-Mar-1951, 65 y.o.   MRN: [id number]  HPI  Mr. Shearer is a 65 yr old male who presents today for follow up.  1) DM2- patient is maintained on glipizide, novolog, levemir. Was unable to afford invokana due to cost. Reports sugars 90-130 fasting. Denies Hypoglycemia.    Lab Results  Component Value Date   HGBA1C 7.7* 09/08/2015   HGBA1C 7.3* 06/07/2015   HGBA1C 7.8* 03/02/2015   Lab Results  Component Value Date   MICROALBUR <0.7 03/02/2015   LDLCALC 85 09/08/2015   CREATININE 0.90 09/08/2015   2) HTN- currently maintained on lisinopril BP Readings from Last 3 Encounters:  09/08/15 126/78  06/07/15 145/80  03/02/15 130/86   3) Hyperlipidemia- on crestor.  Lab Results  Component Value Date   CHOL 142 09/08/2015   HDL 45.80 09/08/2015   LDLCALC 85 09/08/2015   LDLDIRECT 177.6 10/07/2007   TRIG 56.0 09/08/2015   CHOLHDL 3 09/08/2015       Review of Systems See HPI  Past Medical History  Diagnosis Date  . History of colonic polyps   . Diabetes mellitus type II   . Hypertension   . Hyperlipidemia   . Low back pain   . Breast mass, right 03/2008  . Lipoma   . Abnormal myocardial perfusion study 2006    EF 44% ? inferoseptal ischemia. no cath done  . Contact dermatitis 01/31/2013     Social History   Social History  . Marital Status: Married    Spouse Name: N/A  . Number of Children: N/A  . Years of Education: N/A   Occupational History  . Not on file.   Social History Main Topics  . Smoking status: Former Research scientist (life sciences)  . Smokeless tobacco: Never Used     Comment: 33 pack year history  . Alcohol Use: No  . Drug Use: No  . Sexual Activity: Not on file   Other Topics Concern  . Not on file   Social History Narrative   Occupation: carpenter   Married    Former Smoker -  33 pack year history   Alcohol use-no     Drug use-no              Past Surgical History  Procedure Laterality  Date  . Lipoma excision  07/2008     Dr Gershon Crane    Family History  Problem Relation Age of Onset  . Melanoma Father 44    deceased secondary to melanoma  . Alzheimer's disease Father   . Diabetes Father   . Hypertension Mother     alive -30    Allergies  Allergen Reactions  . Amlodipine Besylate     REACTION: ? caused left axillary nodules  . Hydrochlorothiazide Other (See Comments)    ?unknown reaction    Current Outpatient Prescriptions on File Prior to Visit  Medication Sig Dispense Refill  . aspirin 81 MG tablet Take 81 mg by mouth daily.      . B-D ULTRAFINE III SHORT PEN 31G X 8 MM MISC USE AS DIRECTED 100 each 1  . Cyanocobalamin (VITAMIN B-12 PO) Take 1 tablet by mouth daily.    Marland Kitchen glipiZIDE (GLUCOTROL XL) 10 MG 24 hr tablet TAKE ONE TABLET BY MOUTH ONCE DAILY WITH BREAKFAST. 90 tablet 1  . insulin aspart (NOVOLOG FLEXPEN) 100 UNIT/ML FlexPen Inject 11-15 Units into the skin daily before supper. 9 mL 3  .  Insulin Detemir (LEVEMIR) 100 UNIT/ML Pen Inject 40 Units into the skin daily at 10 pm. 15 mL 11  . lisinopril (PRINIVIL,ZESTRIL) 30 MG tablet Take 1 tablet (30 mg total) by mouth daily. 90 tablet 1  . metFORMIN (GLUCOPHAGE-XR) 500 MG 24 hr tablet Take 2 tablets (1,000 mg total) by mouth 2 (two) times daily. 360 tablet 1  . NIFEdipine (ADALAT CC) 90 MG 24 hr tablet TAKE ONE TABLET BY MOUTH ONCE DAILY 90 tablet 1  . rosuvastatin (CRESTOR) 20 MG tablet TAKE ONE TABLET BY MOUTH ONCE DAILY 30 tablet 5  . tadalafil (CIALIS) 20 MG tablet One by mouth every 36 hours as needed 6 tablet 11  . canagliflozin (INVOKANA) 100 MG TABS tablet Take 1 tablet (100 mg total) by mouth daily. (Patient not taking: Reported on 12/12/2015) 90 tablet 1   No current facility-administered medications on file prior to visit.    BP 130/77 mmHg  Pulse 81  Temp(Src) 98.4 F (36.9 C) (Oral)  Resp 16  Ht 6\' 3"  (1.905 m)  Wt 202 lb (91.627 kg)  BMI 25.25 kg/m2  SpO2 96%       Objective:    Physical Exam  Constitutional: He is oriented to person, place, and time. He appears well-developed and well-nourished. No distress.  HENT:  Head: Normocephalic and atraumatic.  Cardiovascular: Normal rate and regular rhythm.   No murmur heard. Pulmonary/Chest: Effort normal and breath sounds normal. No respiratory distress. He has no wheezes. He has no rales.  Musculoskeletal:  Trace bilateral LE edema  Neurological: He is alert and oriented to person, place, and time.  Skin: Skin is warm and dry.  Psychiatric: He has a normal mood and affect. His behavior is normal. Thought content normal.          Assessment & Plan:

## 2015-12-12 NOTE — Assessment & Plan Note (Signed)
BP at goal on lisinopril. Continue same.

## 2015-12-13 NOTE — Telephone Encounter (Signed)
Notified pt and he voices understanding. States he was out of insulin for 2 weeks when changing from levemir to lantus about 2 months ago. Lantus was too costly and he was changed back to Levemir. Advised pt to still increase per below recommendation.

## 2015-12-13 NOTE — Telephone Encounter (Signed)
Noted and agree. 

## 2015-12-19 ENCOUNTER — Other Ambulatory Visit: Payer: Self-pay | Admitting: Family

## 2016-02-05 ENCOUNTER — Telehealth: Payer: Self-pay | Admitting: *Deleted

## 2016-02-05 MED ORDER — ROSUVASTATIN CALCIUM 20 MG PO TABS: 20.0000 mg | ORAL_TABLET | Freq: Every day | ORAL | Status: DC

## 2016-02-05 NOTE — Telephone Encounter (Signed)
Received fax from [facility] requesting crestor refills. Pt has f/u 02/2016. Refills sent.

## 2016-03-12 ENCOUNTER — Ambulatory Visit: Payer: BC Managed Care – PPO | Admitting: Family

## 2016-03-13 ENCOUNTER — Other Ambulatory Visit: Payer: Self-pay | Admitting: Family

## 2016-04-05 ENCOUNTER — Telehealth: Payer: Self-pay | Admitting: *Deleted

## 2016-04-05 MED ORDER — GLIPIZIDE ER 10 MG PO TB24: ORAL_TABLET | ORAL | Status: DC

## 2016-04-05 NOTE — Telephone Encounter (Signed)
Received [facility] from [facility] for glipizide ER 10 mg daily. Pt was due for 3 month f/u on 03/06/16. 90 day supply sent to pharmacy with note for office visit for further refills.   Shiquita-- please call pt to schedule a follow up with Melissa soon. Thanks!

## 2016-04-05 NOTE — Telephone Encounter (Signed)
Future refills will not be given until pt is seen in the office.

## 2016-04-05 NOTE — Telephone Encounter (Signed)
Called pt, informed him of the below. Pt says that he will call us back to schedule F/U appt at a better time.

## 2016-04-26 ENCOUNTER — Telehealth: Payer: Self-pay | Admitting: Family

## 2016-04-26 NOTE — Telephone Encounter (Signed)
Spoke with pharmacist and she states that Novolog has been filled and is waiting on pt to pick up. She will get Levemir ready. Left detailed message on pt's home # and to call if any questions.

## 2016-04-26 NOTE — Telephone Encounter (Signed)
Caller name: [name]  Relationship to patient: Wife  Can be reached: [phone]  [id number] [place], [place]. [phone] (Phone) [phone] (Fax)    Reason for call: Wife called stating that she got a new refrig last week and left patients Novolog and Levemir in the old refrig. Needs a Rx called in to pharmacy because he had just gotten refills.

## 2016-05-01 ENCOUNTER — Ambulatory Visit (INDEPENDENT_AMBULATORY_CARE_PROVIDER_SITE_OTHER): Payer: BC Managed Care – PPO | Admitting: Family

## 2016-05-01 ENCOUNTER — Encounter: Payer: Self-pay | Admitting: Family

## 2016-05-01 VITALS — BP 123/74 | HR 80 | Temp 98.0°F | Resp 17 | Ht 75.0 in | Wt 198.0 lb

## 2016-05-01 DIAGNOSIS — E118 Type 2 diabetes mellitus with unspecified complications: Secondary | ICD-10-CM

## 2016-05-01 DIAGNOSIS — E785 Hyperlipidemia, unspecified: Secondary | ICD-10-CM

## 2016-05-01 DIAGNOSIS — E1140 Type 2 diabetes mellitus with diabetic neuropathy, unspecified: Secondary | ICD-10-CM | POA: Diagnosis not present

## 2016-05-01 DIAGNOSIS — E1165 Type 2 diabetes mellitus with hyperglycemia: Secondary | ICD-10-CM | POA: Diagnosis not present

## 2016-05-01 DIAGNOSIS — I10 Essential (primary) hypertension: Secondary | ICD-10-CM

## 2016-05-01 DIAGNOSIS — Z23 Encounter for immunization: Secondary | ICD-10-CM | POA: Diagnosis not present

## 2016-05-01 DIAGNOSIS — Z794 Long term (current) use of insulin: Secondary | ICD-10-CM

## 2016-05-01 DIAGNOSIS — IMO0002 Reserved for concepts with insufficient information to code with codable children: Secondary | ICD-10-CM

## 2016-05-01 LAB — BASIC METABOLIC PANEL
BUN: 10 mg/dL (ref 6–23)
CALCIUM: 9.2 mg/dL (ref 8.4–10.5)
CHLORIDE: 106 meq/L (ref 96–112)
CO2: 27 meq/L (ref 19–32)
CREATININE: 0.82 mg/dL (ref 0.40–1.50)
GFR: 121.39 mL/min (ref >60.00)
GLUCOSE: 83 mg/dL (ref 70–99)
Potassium: 3.9 mEq/L (ref 3.5–5.1)
Sodium: 140 mEq/L (ref 135–145)

## 2016-05-01 LAB — HEMOGLOBIN A1C: Hgb A1c MFr Bld: 9.2 % — ABNORMAL HIGH (ref 4.6–6.5)

## 2016-05-01 MED ORDER — INSULIN ASPART 100 UNIT/ML FLEXPEN: PEN_INJECTOR | SUBCUTANEOUS | 3 refills | Status: DC

## 2016-05-01 NOTE — Progress Notes (Signed)
Pre visit review using our clinic review tool, if applicable. No additional management support is needed unless otherwise documented below in the visit note. 

## 2016-05-01 NOTE — Assessment & Plan Note (Signed)
LDL at goal, continue statin. 

## 2016-05-01 NOTE — Assessment & Plan Note (Signed)
BP stable on current meds. Continue same,obtain follow up bmet.  

## 2016-05-01 NOTE — Assessment & Plan Note (Addendum)
Uncontrolled, had been taking novolog 30 min after dinner. Advised pt to take 15 min before dinner. Will add sliding scale coverage for lunch as well. Continue levemir. Obtain a1c.

## 2016-05-01 NOTE — Progress Notes (Signed)
Subjective:    Patient ID: [name], male    DOB: March 10, 1951, 65 y.o.   MRN: [id number]  HPI  DM2-  On novolog using 11-15 units (takes 30 minutes after eating), levemir 45 units, metformin and glucotrol.  He is only using novolog AC dinner. 96-130 in the AM fasting. Post prandial can go as high as 270.  The other meal that he eats is lunch.  Dinner is his largest meal of the day.  He is also on glucotrol xl and metformin.  He denies any sugars <80. Lab Results  Component Value Date   HGBA1C 8.8 (H) 12/12/2015   HGBA1C 7.7 (H) 09/08/2015   HGBA1C 7.3 (H) 06/07/2015   Lab Results  Component Value Date   MICROALBUR <0.7 03/02/2015   LDLCALC 85 09/08/2015   CREATININE 0.90 09/08/2015   HTN- maintained  On nifedipine, lisinopril.   BP Readings from Last 3 Encounters:  05/01/16 123/74  12/12/15 130/77  09/08/15 126/78   Hyperlipidemia- maintained on crestor.  Lab Results  Component Value Date   CHOL 142 09/08/2015   HDL 45.80 09/08/2015   LDLCALC 85 09/08/2015   LDLDIRECT 177.6 10/07/2007   TRIG 56.0 09/08/2015   CHOLHDL 3 09/08/2015     Review of Systems  Respiratory: Negative for shortness of breath.   Cardiovascular: Negative for chest pain and leg swelling.  Musculoskeletal: Negative for arthralgias.  has some occasional cramping at night, worse on days that days that he works.   Past Medical History:  Diagnosis Date  . Abnormal myocardial perfusion study 2006   EF 44% ? inferoseptal ischemia. no cath done  . Breast mass, right 03/2008  . Contact dermatitis 01/31/2013  . Diabetes mellitus type II   . History of colonic polyps   . Hyperlipidemia   . Hypertension   . Lipoma   . Low back pain      Social History   Social History  . Marital status: Married    Spouse name: N/A  . Number of children: N/A  . Years of education: N/A   Occupational History  . Not on file.   Social History Main Topics  . Smoking status: Former Research scientist (life sciences)  . Smokeless  tobacco: Never Used     Comment: 33 pack year history  . Alcohol use No  . Drug use: No  . Sexual activity: Not on file   Other Topics Concern  . Not on file   Social History Narrative   Occupation: carpenter   Married    Former Smoker -  33 pack year history   Alcohol use-no     Drug use-no              Past Surgical History:  Procedure Laterality Date  . LIPOMA EXCISION  07/2008    Dr Gershon Crane    Family History  Problem Relation Age of Onset  . Melanoma Father 68    deceased secondary to melanoma  . Alzheimer's disease Father   . Diabetes Father   . Hypertension Mother     alive -31    Allergies  Allergen Reactions  . Amlodipine Besylate     REACTION: ? caused left axillary nodules  . Hydrochlorothiazide Other (See Comments)    ?unknown reaction    Current Outpatient Prescriptions on File Prior to Visit  Medication Sig Dispense Refill  . aspirin 81 MG tablet Take 81 mg by mouth daily.      . B-D ULTRAFINE III SHORT PEN  31G X 8 MM MISC USE AS DIRECTED 100 each 5  . Cyanocobalamin (VITAMIN B-12 PO) Take 1 tablet by mouth daily.    Marland Kitchen glipiZIDE (GLUCOTROL XL) 10 MG 24 hr tablet TAKE ONE TABLET BY MOUTH ONCE DAILY WITH BREAKFAST. 90 tablet p  . glucose blood (PRODIGY NO CODING BLOOD GLUC) test strip Use as instructed to check blood sugar twice a day.  DX  E11.40    . insulin aspart (NOVOLOG FLEXPEN) 100 UNIT/ML FlexPen Inject 11-15 Units into the skin daily before supper. 9 mL 3  . Insulin Detemir (LEVEMIR) 100 UNIT/ML Pen Inject 45 Units into the skin daily at 10 pm. 15 mL 11  . lisinopril (PRINIVIL,ZESTRIL) 30 MG tablet Take 1 tablet (30 mg total) by mouth daily. 90 tablet 1  . metFORMIN (GLUCOPHAGE-XR) 500 MG 24 hr tablet Take 2 tablets (1,000 mg total) by mouth 2 (two) times daily. 360 tablet 1  . NIFEdipine (ADALAT CC) 90 MG 24 hr tablet TAKE 1 TABLET BY MOUTH EVERY DAY 90 tablet 0  . rosuvastatin (CRESTOR) 20 MG tablet Take 1 tablet (20 mg total) by mouth daily.  30 tablet 5  . tadalafil (CIALIS) 20 MG tablet One by mouth every 36 hours as needed 6 tablet 11   No current facility-administered medications on file prior to visit.     BP 123/74 (BP Location: Right Arm, Patient Position: Sitting, Cuff Size: Normal)   Pulse 80   Temp 98 F (36.7 C) (Oral)   Resp 17   Ht 6\' 3"  (1.905 m)   Wt 198 lb (89.8 kg)   SpO2 97%   BMI 24.75 kg/m       Objective:   Physical Exam  Constitutional: He is oriented to person, place, and time. He appears well-developed and well-nourished. No distress.  HENT:  Head: Normocephalic and atraumatic.  Cardiovascular: Normal rate and regular rhythm.   No murmur heard. Pulmonary/Chest: Effort normal and breath sounds normal. No respiratory distress. He has no wheezes. He has no rales.  Musculoskeletal: He exhibits no edema.  Neurological: He is alert and oriented to person, place, and time.  Skin: Skin is warm and dry.  Psychiatric: He has a normal mood and affect. His behavior is normal. Thought content normal.          Assessment & Plan:  Flu shot today.

## 2016-05-01 NOTE — Patient Instructions (Addendum)
Please add novolog sliding scale coverage before lunch as below.  <150-   Zero units 150-200 2 units 201-250 4 units 251-300 6 units 301-350 8 units 351-400 10 units >400             12 units and contact us.  At dinner time please administer 11 units 15 minutes prior to eating. If sugars 1-2 hours after meals are above 200, you can increase your insulin to 15 units at bedtime.

## 2016-05-05 ENCOUNTER — Other Ambulatory Visit: Payer: Self-pay | Admitting: Family

## 2016-06-03 ENCOUNTER — Other Ambulatory Visit: Payer: Self-pay | Admitting: Family

## 2016-06-05 ENCOUNTER — Telehealth: Payer: Self-pay | Admitting: Family

## 2016-06-05 MED ORDER — GLUCOSE BLOOD VI STRP: ORAL_STRIP | 1 refills | Status: DC

## 2016-06-05 NOTE — Telephone Encounter (Signed)
Caller name: [name] Relation to pt: spouse  Call back [place]: [id number] [place], [place]. [phone] (Phone) [phone] (Fax)     Reason for call:  Requesting test strips

## 2016-06-05 NOTE — Telephone Encounter (Signed)
Refill sent.

## 2016-06-17 ENCOUNTER — Other Ambulatory Visit: Payer: Self-pay | Admitting: Family

## 2016-07-27 ENCOUNTER — Other Ambulatory Visit: Payer: Self-pay | Admitting: Family

## 2016-07-29 NOTE — Telephone Encounter (Signed)
Pt has a follow up appointment scheduled 07/31/2016. Ihave refilled Rx for #90 tablets #0 refills. TL/CMA

## 2016-07-31 ENCOUNTER — Encounter: Payer: Self-pay | Admitting: Family

## 2016-07-31 ENCOUNTER — Ambulatory Visit (INDEPENDENT_AMBULATORY_CARE_PROVIDER_SITE_OTHER): Payer: BC Managed Care – PPO | Admitting: Family

## 2016-07-31 VITALS — BP 137/75 | HR 88 | Temp 98.4°F | Resp 18 | Ht 75.0 in | Wt 199.6 lb

## 2016-07-31 DIAGNOSIS — I10 Essential (primary) hypertension: Secondary | ICD-10-CM

## 2016-07-31 DIAGNOSIS — IMO0001 Reserved for inherently not codable concepts without codable children: Secondary | ICD-10-CM

## 2016-07-31 DIAGNOSIS — E1140 Type 2 diabetes mellitus with diabetic neuropathy, unspecified: Secondary | ICD-10-CM

## 2016-07-31 DIAGNOSIS — E785 Hyperlipidemia, unspecified: Secondary | ICD-10-CM

## 2016-07-31 DIAGNOSIS — Z794 Long term (current) use of insulin: Secondary | ICD-10-CM | POA: Diagnosis not present

## 2016-07-31 DIAGNOSIS — E1165 Type 2 diabetes mellitus with hyperglycemia: Secondary | ICD-10-CM

## 2016-07-31 DIAGNOSIS — IMO0002 Reserved for concepts with insufficient information to code with codable children: Secondary | ICD-10-CM

## 2016-07-31 DIAGNOSIS — Z1211 Encounter for screening for malignant neoplasm of colon: Secondary | ICD-10-CM | POA: Diagnosis not present

## 2016-07-31 LAB — HEMOGLOBIN A1C: HEMOGLOBIN A1C: 9.1 % — AB (ref 4.6–6.5)

## 2016-07-31 LAB — BASIC METABOLIC PANEL
BUN: 12 mg/dL (ref 6–23)
CHLORIDE: 104 meq/L (ref 96–112)
CO2: 27 mEq/L (ref 19–32)
CREATININE: 0.89 mg/dL (ref 0.40–1.50)
Calcium: 9.2 mg/dL (ref 8.4–10.5)
GFR: 110.35 mL/min (ref >60.00)
Glucose, Bld: 178 mg/dL — ABNORMAL HIGH (ref 70–99)
POTASSIUM: 4.1 meq/L (ref 3.5–5.1)
SODIUM: 138 meq/L (ref 135–145)

## 2016-07-31 LAB — LIPID PANEL
Cholesterol: 145 mg/dL (ref 0–200)
HDL: 47.5 mg/dL
LDL Cholesterol: 83 mg/dL (ref 0–99)
NonHDL: 97.86
Total CHOL/HDL Ratio: 3
Triglycerides: 75 mg/dL (ref 0.0–149.0)
VLDL: 15 mg/dL (ref 0.0–40.0)

## 2016-07-31 NOTE — Assessment & Plan Note (Signed)
Control seems to be better, however has only improved in the last few weeks since he retired so I expect A1C will still be above goal.  Continue current insulin dosing, reinforced importance of taking novolog AC meals.

## 2016-07-31 NOTE — Progress Notes (Signed)
Pre visit review using our clinic review tool, if applicable. No additional management support is needed unless otherwise documented below in the visit note. 

## 2016-07-31 NOTE — Patient Instructions (Signed)
Please complete lab work prior to leaving. Take your insulin 15 minutes before eating.

## 2016-07-31 NOTE — Progress Notes (Signed)
Subjective:    Patient ID: [name], male    DOB: Nov 05, 1950, 65 y.o.   MRN: [id number]  HPI  [name] is a 65 yr old male who presents today for follow up.  1) DM2- Patient is maintained on novolog 11-15 units AC dinner. We also added a sliding scale AC lunch.  He is also maintained on glipizide 10mg .  Also on levemir 45 units hs.  He retired 3 weeks ago.  He has just started to add the novolog with lunch.  He reports that his sugars 130-150, low in the 90's.  Notes that last month it was higher but he was taking "alkaselzer" which had sugar in it.   Lab Results  Component Value Date   HGBA1C 9.2 (H) 05/01/2016   HGBA1C 8.8 (H) 12/12/2015   HGBA1C 7.7 (H) 09/08/2015   Lab Results  Component Value Date   MICROALBUR <0.7 03/02/2015   LDLCALC 85 09/08/2015   CREATININE 0.82 05/01/2016   2) HTN-  On nifedipine and lisinopril.  BP Readings from Last 3 Encounters:  07/31/16 137/75  05/01/16 123/74  12/12/15 130/77   3) Hyperlipidemia-  Lab Results  Component Value Date   CHOL 142 09/08/2015   HDL 45.80 09/08/2015   LDLCALC 85 09/08/2015   LDLDIRECT 177.6 10/07/2007   TRIG 56.0 09/08/2015   CHOLHDL 3 09/08/2015     Review of Systems    see HPI  Past Medical History:  Diagnosis Date  . Abnormal myocardial perfusion study 2006   EF 44% ? inferoseptal ischemia. no cath done  . Breast mass, right 03/2008  . Contact dermatitis 01/31/2013  . Diabetes mellitus type II   . History of colonic polyps   . Hyperlipidemia   . Hypertension   . Lipoma   . Low back pain      Social History   Social History  . Marital status: Married    Spouse name: N/A  . Number of children: N/A  . Years of education: N/A   Occupational History  . Not on file.   Social History Main Topics  . Smoking status: Former Research scientist (life sciences)  . Smokeless tobacco: Never Used     Comment: 33 pack year history  . Alcohol use No  . Drug use: No  . Sexual activity: Not on file   Other  Topics Concern  . Not on file   Social History Narrative   Occupation: carpenter   Married    Former Smoker -  33 pack year history   Alcohol use-no     Drug use-no              Past Surgical History:  Procedure Laterality Date  . LIPOMA EXCISION  07/2008    Dr Gershon Crane    Family History  Problem Relation Age of Onset  . Melanoma Father 38    deceased secondary to melanoma  . Alzheimer's disease Father   . Diabetes Father   . Hypertension Mother     alive -67    Allergies  Allergen Reactions  . Amlodipine Besylate     REACTION: ? caused left axillary nodules  . Hydrochlorothiazide Other (See Comments)    ?unknown reaction    Current Outpatient Prescriptions on File Prior to Visit  Medication Sig Dispense Refill  . aspirin 81 MG tablet Take 81 mg by mouth daily.      . B-D ULTRAFINE III SHORT PEN 31G X 8 MM MISC USE AS DIRECTED 100 each  5  . Cyanocobalamin (VITAMIN B-12 PO) Take 1 tablet by mouth daily.    Marland Kitchen glipiZIDE (GLUCOTROL XL) 10 MG 24 hr tablet TAKE 1 TABLET BY MOUTH EVERY DAY WITH BREAKFAST 90 tablet 0  . glucose blood (PRODIGY NO CODING BLOOD GLUC) test strip Use as instructed to check blood sugar twice a day.  DX  E11.40 100 each 1  . insulin aspart (NOVOLOG FLEXPEN) 100 UNIT/ML FlexPen Inject per sliding scale before lunch, 11-15 units before dinner 9 mL 3  . Insulin Detemir (LEVEMIR) 100 UNIT/ML Pen Inject 45 Units into the skin daily at 10 pm. (Patient taking differently: Inject 45 Units into the skin every morning. ) 15 mL 11  . lisinopril (PRINIVIL,ZESTRIL) 30 MG tablet TAKE 1 TABLET BY MOUTH EVERY DAY 90 tablet 1  . metFORMIN (GLUCOPHAGE-XR) 500 MG 24 hr tablet TAKE 2 TABLETS BY MOUTH TWICE A DAY 360 tablet 1  . NIFEdipine (ADALAT CC) 90 MG 24 hr tablet TAKE 1 TABLET BY MOUTH EVERY DAY 90 tablet 0  . rosuvastatin (CRESTOR) 20 MG tablet Take 1 tablet (20 mg total) by mouth daily. 30 tablet 5  . tadalafil (CIALIS) 20 MG tablet One by mouth every 36 hours as  needed 6 tablet 11   No current facility-administered medications on file prior to visit.     BP 137/75 (BP Location: Right Arm, Cuff Size: Normal)   Pulse 88   Temp 98.4 F (36.9 C) (Oral)   Resp 18   Ht 6\' 3"  (1.905 m)   Wt 199 lb 9.6 oz (90.5 kg)   SpO2 98% Comment: room air  BMI 24.95 kg/m    Objective:   Physical Exam  Constitutional: He is oriented to person, place, and time. He appears well-developed and well-nourished. No distress.  HENT:  Head: Normocephalic and atraumatic.  Cardiovascular: Normal rate and regular rhythm.   No murmur heard. Pulmonary/Chest: Effort normal and breath sounds normal. No respiratory distress. He has no wheezes. He has no rales.  Musculoskeletal: He exhibits no edema.  Neurological: He is alert and oriented to person, place, and time.  Skin: Skin is warm and dry.  Psychiatric: He has a normal mood and affect. His behavior is normal. Thought content normal.          Assessment & Plan:

## 2016-07-31 NOTE — Assessment & Plan Note (Signed)
BP Readings from Last 3 Encounters:  07/31/16 137/75  05/01/16 123/74  12/12/15 130/77   BP is stable on current meds, continue same.

## 2016-07-31 NOTE — Assessment & Plan Note (Signed)
Last LDL at goal, due for repeat lipids. Will obtain.

## 2016-08-03 ENCOUNTER — Telehealth: Payer: Self-pay | Admitting: Family

## 2016-08-03 MED ORDER — DAPAGLIFLOZIN PROPANEDIOL 5 MG PO TABS: 5.0000 mg | ORAL_TABLET | Freq: Every day | ORAL | 5 refills | Status: DC

## 2016-08-03 NOTE — Telephone Encounter (Signed)
Sugar is above goal, add farxiga 5mg  once daily.  Cholesterol is at goal.  Continue crestor.

## 2016-08-05 NOTE — Telephone Encounter (Signed)
Attempted to reach pt but voicemail box has not been set up yet. Will try again.

## 2016-08-05 NOTE — Telephone Encounter (Signed)
Notified pt and he voices understanding. 

## 2016-08-07 ENCOUNTER — Encounter: Payer: Self-pay | Admitting: Gastroenterology

## 2016-08-24 ENCOUNTER — Other Ambulatory Visit: Payer: Self-pay | Admitting: Family

## 2016-09-18 ENCOUNTER — Encounter: Payer: Self-pay | Admitting: Family

## 2016-09-22 ENCOUNTER — Other Ambulatory Visit: Payer: Self-pay | Admitting: Family

## 2016-10-02 ENCOUNTER — Encounter: Payer: BC Managed Care – PPO | Admitting: Gastroenterology

## 2016-10-23 ENCOUNTER — Other Ambulatory Visit: Payer: Self-pay | Admitting: Family

## 2016-10-24 ENCOUNTER — Other Ambulatory Visit: Payer: Self-pay | Admitting: Family

## 2016-10-29 ENCOUNTER — Encounter: Payer: Self-pay | Admitting: Family

## 2016-10-29 ENCOUNTER — Ambulatory Visit (INDEPENDENT_AMBULATORY_CARE_PROVIDER_SITE_OTHER): Payer: Medicare Other | Admitting: Family

## 2016-10-29 VITALS — BP 129/71 | HR 96 | Temp 97.8°F | Resp 16 | Ht 77.0 in | Wt 195.6 lb

## 2016-10-29 DIAGNOSIS — E1165 Type 2 diabetes mellitus with hyperglycemia: Secondary | ICD-10-CM | POA: Diagnosis not present

## 2016-10-29 DIAGNOSIS — N529 Male erectile dysfunction, unspecified: Secondary | ICD-10-CM | POA: Diagnosis not present

## 2016-10-29 DIAGNOSIS — Z794 Long term (current) use of insulin: Secondary | ICD-10-CM

## 2016-10-29 DIAGNOSIS — E1140 Type 2 diabetes mellitus with diabetic neuropathy, unspecified: Secondary | ICD-10-CM | POA: Diagnosis not present

## 2016-10-29 DIAGNOSIS — IMO0001 Reserved for inherently not codable concepts without codable children: Secondary | ICD-10-CM

## 2016-10-29 DIAGNOSIS — IMO0002 Reserved for concepts with insufficient information to code with codable children: Secondary | ICD-10-CM

## 2016-10-29 DIAGNOSIS — I10 Essential (primary) hypertension: Secondary | ICD-10-CM

## 2016-10-29 MED ORDER — METFORMIN HCL ER 500 MG PO TB24: 1000.0000 mg | ORAL_TABLET | Freq: Two times a day (BID) | ORAL | 1 refills | Status: DC

## 2016-10-29 MED ORDER — GLIPIZIDE ER 10 MG PO TB24: ORAL_TABLET | ORAL | 0 refills | Status: DC

## 2016-10-29 MED ORDER — NIFEDIPINE ER 90 MG PO TB24: 90.0000 mg | ORAL_TABLET | Freq: Every day | ORAL | 0 refills | Status: DC

## 2016-10-29 MED ORDER — LISINOPRIL 30 MG PO TABS: 30.0000 mg | ORAL_TABLET | Freq: Every day | ORAL | 1 refills | Status: DC

## 2016-10-29 MED ORDER — ROSUVASTATIN CALCIUM 20 MG PO TABS: 20.0000 mg | ORAL_TABLET | Freq: Every day | ORAL | 2 refills | Status: DC

## 2016-10-29 NOTE — Assessment & Plan Note (Signed)
Suspect A1C will be elevated due to his 3 weeks without meds. Current sugars sound stable on current regimen. Continue current meds/insulin dosing and obtain follow up A1C.

## 2016-10-29 NOTE — Progress Notes (Signed)
Pre visit review using our clinic review tool, if applicable. No additional management support is needed unless otherwise documented below in the visit note. 

## 2016-10-29 NOTE — Patient Instructions (Signed)
Schedule a lab visit in 1 week.

## 2016-10-29 NOTE — Assessment & Plan Note (Signed)
Stable on ACE, continue same.

## 2016-10-29 NOTE — Progress Notes (Signed)
Subjective:    Patient ID: [name], male    DOB: Jun 03, 1951, 66 y.o.   MRN: [id number]  HPI  [name] is a 66 yr old male who presents today for follow up. He recently retired and is adjusting to his new lifestyle.   DM2- maintained on glucotrol, farxiga, levemir 45 units daily, metformin, novolog 11-15 units AC dinner.  Last visit we discussed adding a sliding scale novolog coverage AC lunch. Reports that he did not have medication x 3 weeks in January.  He denies sugars <80.   He reports that his sugars are around 96-142.  Lab Results  Component Value Date   HGBA1C 9.1 (H) 07/31/2016   HGBA1C 9.2 (H) 05/01/2016   HGBA1C 8.8 (H) 12/12/2015   Lab Results  Component Value Date   MICROALBUR <0.7 03/02/2015   LDLCALC 83 07/31/2016   CREATININE 0.89 07/31/2016   HTN- maintained on lisinopril 30mg  once daily, nifedipine.  Denies, CP/SOB or swelling.  BP Readings from Last 3 Encounters:  10/29/16 129/71  07/31/16 137/75  05/01/16 123/74   ED- uses cialis PRN.  Reports that cialis helps some.    Review of Systems See HPI  Past Medical History:  Diagnosis Date  . Abnormal myocardial perfusion study 2006   EF 44% ? inferoseptal ischemia. no cath done  . Breast mass, right 03/2008  . Contact dermatitis 01/31/2013  . Diabetes mellitus type II   . History of colonic polyps   . Hyperlipidemia   . Hypertension   . Lipoma   . Low back pain      Social History   Social History  . Marital status: Married    Spouse name: N/A  . Number of children: N/A  . Years of education: N/A   Occupational History  . Not on file.   Social History Main Topics  . Smoking status: Former Research scientist (life sciences)  . Smokeless tobacco: Never Used     Comment: 33 pack year history  . Alcohol use No  . Drug use: No  . Sexual activity: Not on file   Other Topics Concern  . Not on file   Social History Narrative   Occupation: carpenter   Married    Former Smoker -  33 pack year history     Alcohol use-no     Drug use-no              Past Surgical History:  Procedure Laterality Date  . LIPOMA EXCISION  07/2008    Dr Gershon Crane    Family History  Problem Relation Age of Onset  . Melanoma Father 68    deceased secondary to melanoma  . Alzheimer's disease Father   . Diabetes Father   . Hypertension Mother     alive -46    Allergies  Allergen Reactions  . Amlodipine Besylate     REACTION: ? caused left axillary nodules  . Hydrochlorothiazide Other (See Comments)    ?unknown reaction    Current Outpatient Prescriptions on File Prior to Visit  Medication Sig Dispense Refill  . aspirin 81 MG tablet Take 81 mg by mouth daily.      . B-D ULTRAFINE III SHORT PEN 31G X 8 MM MISC USE AS DIRECTED 100 each 5  . Cyanocobalamin (VITAMIN B-12 PO) Take 1 tablet by mouth daily.    . dapagliflozin propanediol (FARXIGA) 5 MG TABS tablet Take 5 mg by mouth daily. 30 tablet 5  . glipiZIDE (GLUCOTROL XL) 10 MG 24  hr tablet TAKE 1 TABLET BY MOUTH EVERY DAY WITH BREAKFAST 90 tablet 0  . glucose blood (PRODIGY NO CODING BLOOD GLUC) test strip Use as instructed to check blood sugar twice a day.  DX  E11.40 100 each 1  . Insulin Detemir (LEVEMIR) 100 UNIT/ML Pen Inject 45 Units into the skin daily at 10 pm. (Patient taking differently: Inject 45 Units into the skin every morning. ) 15 mL 11  . lisinopril (PRINIVIL,ZESTRIL) 30 MG tablet TAKE 1 TABLET BY MOUTH EVERY DAY 90 tablet 1  . metFORMIN (GLUCOPHAGE-XR) 500 MG 24 hr tablet TAKE 2 TABLETS BY MOUTH TWICE A DAY 360 tablet 1  . NIFEdipine (ADALAT CC) 90 MG 24 hr tablet TAKE 1 TABLET BY MOUTH EVERY DAY 90 tablet 0  . NOVOLOG FLEXPEN 100 UNIT/ML FlexPen INJECT 11-15 SUBCUTANEOUSLY DAILY BEFORE SUPPER 15 mL 1  . rosuvastatin (CRESTOR) 20 MG tablet TAKE 1 TABLET (20 MG TOTAL) BY MOUTH DAILY. 30 tablet 2  . tadalafil (CIALIS) 20 MG tablet One by mouth every 36 hours as needed 6 tablet 11   No current facility-administered medications on file  prior to visit.     BP 129/71 (BP Location: Right Arm, Patient Position: Sitting, Cuff Size: Large)   Pulse 96   Temp 97.8 F (36.6 C) (Oral)   Resp 16   Ht 6\' 5"  (1.956 m)   Wt 195 lb 9.6 oz (88.7 kg)   SpO2 90%   BMI 23.19 kg/m       Objective:   Physical Exam  Constitutional: He is oriented to person, place, and time. He appears well-developed and well-nourished. No distress.  HENT:  Head: Normocephalic and atraumatic.  Cardiovascular: Normal rate and regular rhythm.   No murmur heard. Pulmonary/Chest: Effort normal and breath sounds normal. No respiratory distress. He has no wheezes. He has no rales.  Musculoskeletal: He exhibits no edema.  Neurological: He is alert and oriented to person, place, and time.  Skin: Skin is warm and dry.  Psychiatric: He has a normal mood and affect. His behavior is normal. Thought content normal.          Assessment & Plan:

## 2016-10-29 NOTE — Assessment & Plan Note (Signed)
Stable with prn use of cialis.

## 2016-10-31 ENCOUNTER — Other Ambulatory Visit: Payer: Self-pay | Admitting: Family

## 2016-11-01 ENCOUNTER — Other Ambulatory Visit: Payer: Self-pay | Admitting: Family

## 2016-11-06 ENCOUNTER — Ambulatory Visit (AMBULATORY_SURGERY_CENTER): Payer: Self-pay

## 2016-11-06 ENCOUNTER — Encounter: Payer: Self-pay | Admitting: Family

## 2016-11-06 ENCOUNTER — Other Ambulatory Visit: Payer: Medicare Other

## 2016-11-06 VITALS — Ht 75.0 in | Wt 194.4 lb

## 2016-11-06 DIAGNOSIS — Z1211 Encounter for screening for malignant neoplasm of colon: Secondary | ICD-10-CM

## 2016-11-06 LAB — HEMOGLOBIN A1C: Hgb A1c MFr Bld: 8.2 % — ABNORMAL HIGH (ref 4.6–6.5)

## 2016-11-06 LAB — BASIC METABOLIC PANEL
BUN: 14 mg/dL (ref 6–23)
CALCIUM: 9.4 mg/dL (ref 8.4–10.5)
CO2: 26 meq/L (ref 19–32)
Chloride: 105 mEq/L (ref 96–112)
Creatinine, Ser: 0.77 mg/dL (ref 0.40–1.50)
GFR: 130.32 mL/min (ref >60.00)
GLUCOSE: 150 mg/dL — AB (ref 70–99)
Potassium: 4.2 mEq/L (ref 3.5–5.1)
SODIUM: 138 meq/L (ref 135–145)

## 2016-11-06 MED ORDER — SUPREP BOWEL PREP KIT 17.5-3.13-1.6 GM/177ML PO SOLN: 1.0000 | Freq: Once | ORAL | 0 refills | Status: AC

## 2016-11-06 NOTE — Progress Notes (Signed)
No allergies to eggs or soy No past problems with anesthesia No diet meds No home oxygen  Declined emmi 

## 2016-11-07 ENCOUNTER — Other Ambulatory Visit: Payer: Medicare Other

## 2016-11-07 ENCOUNTER — Other Ambulatory Visit: Payer: Self-pay

## 2016-11-07 MED ORDER — INSULIN PEN NEEDLE 31G X 8 MM MISC: 5 refills | Status: DC

## 2016-11-20 ENCOUNTER — Ambulatory Visit (AMBULATORY_SURGERY_CENTER): Payer: Medicare Other | Admitting: Gastroenterology

## 2016-11-20 ENCOUNTER — Encounter: Payer: Self-pay | Admitting: Gastroenterology

## 2016-11-20 VITALS — BP 139/79 | HR 73 | Temp 97.8°F | Resp 16 | Ht 75.0 in | Wt 194.0 lb

## 2016-11-20 DIAGNOSIS — Z1211 Encounter for screening for malignant neoplasm of colon: Secondary | ICD-10-CM

## 2016-11-20 DIAGNOSIS — D128 Benign neoplasm of rectum: Secondary | ICD-10-CM

## 2016-11-20 DIAGNOSIS — D129 Benign neoplasm of anus and anal canal: Secondary | ICD-10-CM

## 2016-11-20 DIAGNOSIS — Z1212 Encounter for screening for malignant neoplasm of rectum: Secondary | ICD-10-CM | POA: Diagnosis not present

## 2016-11-20 MED ORDER — SODIUM CHLORIDE 0.9 % IV SOLN: 500.0000 mL | INTRAVENOUS | Status: DC

## 2016-11-20 NOTE — Progress Notes (Signed)
A and O x3. Report to RN. Tolerated MAC anesthesia well.

## 2016-11-20 NOTE — Patient Instructions (Signed)
Handout given on polyps  YOU HAD AN ENDOSCOPIC PROCEDURE TODAY: Refer to the procedure report and other information in the discharge instructions given to you for any specific questions about what was found during the examination. If this information does not answer your questions, please call [place] office at [phone] to clarify.   YOU SHOULD EXPECT: Some feelings of bloating in the abdomen. Passage of more gas than usual. Walking can help get rid of the air that was put into your GI tract during the procedure and reduce the bloating. If you had a lower endoscopy (such as a colonoscopy or flexible sigmoidoscopy) you may notice spotting of blood in your stool or on the toilet paper. Some abdominal soreness may be present for a day or two, also.  DIET: Your first meal following the procedure should be a light meal and then it is ok to progress to your normal diet. A half-sandwich or bowl of soup is an example of a good first meal. Heavy or fried foods are harder to digest and may make you feel nauseous or bloated. Drink plenty of fluids but you should avoid alcoholic beverages for 24 hours. If you had a esophageal dilation, please see attached instructions for diet.    ACTIVITY: Your care partner should take you home directly after the procedure. You should plan to take it easy, moving slowly for the rest of the day. You can resume normal activity the day after the procedure however YOU SHOULD NOT DRIVE, use power tools, machinery or perform tasks that involve climbing or major physical exertion for 24 hours (because of the sedation medicines used during the test).   SYMPTOMS TO REPORT IMMEDIATELY: A gastroenterologist can be reached at any hour. Please call [phone]  for any of the following symptoms:  Following lower endoscopy (colonoscopy, flexible sigmoidoscopy) Excessive amounts of blood in the stool  Significant tenderness, worsening of abdominal pains  Swelling of the abdomen that is  new, acute  Fever of 100 or higher    FOLLOW UP:  If any biopsies were taken you will be contacted by phone or by letter within the next 1-3 weeks. Call [phone]  if you have not heard about the biopsies in 3 weeks.  Please also call with any specific questions about appointments or follow up tests.  

## 2016-11-20 NOTE — Progress Notes (Signed)
Called to room to assist during endoscopic procedure.  Patient ID and intended procedure confirmed with present staff. Received instructions for my participation in the procedure from the performing physician.  

## 2016-11-20 NOTE — Progress Notes (Signed)
Pt's states no medical or surgical changes since previsit. Patient denies any allergies to eggs or soy.

## 2016-11-20 NOTE — Op Note (Addendum)
[place] Patient Name: [name] Procedure Date: 11/20/2016 8:33 AM MRN: [id number] Endoscopist: Mallie Mussel L. Loletha Carrow , MD Age: 66 Referring MD:  Date of Birth: 1951-02-02 Gender: Male Account #: [id number] Procedure:                Colonoscopy Indications:              Screening for colorectal malignant neoplasm (no                            polyps on colonoscopy > 10 years ago) Medicines:                Monitored Anesthesia Care Procedure:                Pre-Anesthesia Assessment:                           - Prior to the procedure, a History and Physical                            was performed, and patient medications and                            allergies were reviewed. The patient's tolerance of                            previous anesthesia was also reviewed. The risks                            and benefits of the procedure and the sedation                            options and risks were discussed with the patient.                            All questions were answered, and informed consent                            was obtained. Anticoagulants: The patient has taken                            aspirin. It was decided not to withhold this                            medication prior to the procedure. ASA Grade                            Assessment: III - A patient with severe systemic                            disease. After reviewing the risks and benefits,                            the patient was deemed in satisfactory condition to  undergo the procedure.                           After obtaining informed consent, the colonoscope                            was passed under direct vision. Throughout the                            procedure, the patient's blood pressure, pulse, and                            oxygen saturations were monitored continuously. The                            Colonoscope was introduced through the anus  and                            advanced to the the cecum, identified by                            appendiceal orifice and ileocecal valve. The                            colonoscopy was performed without difficulty. The                            patient tolerated the procedure well. The quality                            of the bowel preparation was good (after lavage).                            The ileocecal valve, appendiceal orifice, and                            rectum were photographed. The bowel preparation                            used was SUPREP. Scope In: 8:48:29 AM Scope Out: 9:06:10 AM Scope Withdrawal Time: 0 hours 15 minutes 34 seconds  Total Procedure Duration: 0 hours 17 minutes 41 seconds  Findings:                 The perianal and digital rectal examinations were                            normal.                           A 10 mm polyp was found in the rectum. The polyp                            was sessile. The polyp was removed with a hot  snare. Resection and retrieval were complete.                           The exam was otherwise without abnormality on                            direct and retroflexion views. Complications:            No immediate complications. Estimated Blood Loss:     Estimated blood loss: none. Impression:               - One 10 mm polyp in the rectum, removed with a hot                            snare. Resected and retrieved.                           - The examination was otherwise normal on direct                            and retroflexion views. Recommendation:           - Patient has a contact number available for                            emergencies. The signs and symptoms of potential                            delayed complications were discussed with the                            patient. Return to normal activities tomorrow.                            Written discharge instructions were provided  to the                            patient.                           - Resume previous diet.                           - Continue present medications.                           - No aspirin, ibuprofen, naproxen, or other                            non-steroidal anti-inflammatory drugs for 7 days                            after polyp removal.                           - Await pathology results.                           -  Repeat colonoscopy is recommended for                            surveillance. The colonoscopy date will be                            determined after pathology results from today's                            exam become available for review. [name] L. Loletha Carrow, MD 11/20/2016 9:10:34 AM This report has been signed electronically.

## 2016-11-21 ENCOUNTER — Telehealth: Payer: Self-pay | Admitting: *Deleted

## 2016-11-21 NOTE — Telephone Encounter (Signed)
  Follow up Call-  Call back number 11/20/2016  Post procedure Call Back phone  # [phone]  Permission to leave phone message Yes  Some recent data might be hidden     Patient questions:  Do you have a fever, pain , or abdominal swelling? No. Pain Score  0 *  Have you tolerated food without any problems? Yes.    Have you been able to return to your normal activities? Yes.    Do you have any questions about your discharge instructions: Diet   No. Medications  No. Follow up visit  No.  Do you have questions or concerns about your Care? No.  Actions: * If pain score is 4 or above: No action needed, pain <4.

## 2016-12-02 ENCOUNTER — Encounter: Payer: Self-pay | Admitting: Gastroenterology

## 2016-12-05 ENCOUNTER — Encounter: Payer: Self-pay | Admitting: Gastroenterology

## 2017-01-30 ENCOUNTER — Telehealth: Payer: Self-pay | Admitting: *Deleted

## 2017-01-30 NOTE — Telephone Encounter (Incomplete)
[facility] *** for *** Last filled by MD on *** Last AEX - *** Next AEX - ***

## 2017-01-31 ENCOUNTER — Other Ambulatory Visit: Payer: Self-pay | Admitting: *Deleted

## 2017-01-31 MED ORDER — NIFEDIPINE ER 30 MG PO TB24: 90.0000 mg | ORAL_TABLET | Freq: Every day | ORAL | 1 refills | Status: DC

## 2017-01-31 NOTE — Progress Notes (Signed)
[facility]ed [facility] ER 90 mg tablet, d/t Nifedipine ER 90mg  24HR tablet being on backorder; this seems to be the same medication. Called pharmacy and spoke with [employer] [employer] provider instructions]. The alternative was suppose to read: suggest 30mg  ER tablet and have patient take [3] daily while 90 mg tablets are on back order. Per provider, VO given and per pharmacy request, Rx sent electronically/SLS 06/08

## 2017-02-05 ENCOUNTER — Ambulatory Visit (INDEPENDENT_AMBULATORY_CARE_PROVIDER_SITE_OTHER): Payer: Medicare Other | Admitting: Family

## 2017-02-05 ENCOUNTER — Encounter: Payer: Self-pay | Admitting: Family

## 2017-02-05 VITALS — BP 158/89 | HR 66 | Temp 98.2°F | Resp 18 | Ht 77.0 in | Wt 192.8 lb

## 2017-02-05 DIAGNOSIS — N529 Male erectile dysfunction, unspecified: Secondary | ICD-10-CM

## 2017-02-05 DIAGNOSIS — Z23 Encounter for immunization: Secondary | ICD-10-CM

## 2017-02-05 DIAGNOSIS — IMO0001 Reserved for inherently not codable concepts without codable children: Secondary | ICD-10-CM

## 2017-02-05 DIAGNOSIS — E1165 Type 2 diabetes mellitus with hyperglycemia: Secondary | ICD-10-CM | POA: Diagnosis not present

## 2017-02-05 DIAGNOSIS — Z794 Long term (current) use of insulin: Secondary | ICD-10-CM | POA: Diagnosis not present

## 2017-02-05 DIAGNOSIS — I10 Essential (primary) hypertension: Secondary | ICD-10-CM | POA: Diagnosis not present

## 2017-02-05 LAB — BASIC METABOLIC PANEL
BUN: 16 mg/dL (ref 6–23)
CALCIUM: 9.4 mg/dL (ref 8.4–10.5)
CO2: 27 meq/L (ref 19–32)
Chloride: 109 mEq/L (ref 96–112)
Creatinine, Ser: 0.82 mg/dL (ref 0.40–1.50)
GFR: 121.1 mL/min (ref >60.00)
GLUCOSE: 121 mg/dL — AB (ref 70–99)
Potassium: 4.3 mEq/L (ref 3.5–5.1)
Sodium: 141 mEq/L (ref 135–145)

## 2017-02-05 LAB — HEMOGLOBIN A1C: Hgb A1c MFr Bld: 8.8 % — ABNORMAL HIGH (ref 4.6–6.5)

## 2017-02-05 NOTE — Addendum Note (Signed)
Addended by: Kelle Darting A on: 02/05/2017 05:18 PM   Modules accepted: Orders

## 2017-02-05 NOTE — Progress Notes (Signed)
Subjective:    Patient ID: [name], male    DOB: 07/24/1951, 66 y.o.   MRN: [id number]  HPI  [name] is a 66 yr old male who presents today for follow up.   DM2- reports sugars are in the low 100's. Denies hypoglycemia. He is mainatained on [employer], levemir 45 units, metformin, farxiga, glucotrol Lab Results  Component Value Date   HGBA1C 8.2 (H) 11/06/2016   HGBA1C 9.1 (H) 07/31/2016   HGBA1C 9.2 (H) 05/01/2016   Lab Results  Component Value Date   MICROALBUR <0.7 03/02/2015   LDLCALC 83 07/31/2016   CREATININE 0.77 11/06/2016   HTN- pharmacy was out of nifedipine 90mg . They have filled 30mg  x 3 tabs but he was unaware of this so has been out for several days.  BP Readings from Last 3 Encounters:  02/05/17 (!) 158/89  11/20/16 139/79  10/29/16 129/71   ED- continues with cialis.  Reports that cialis continues to work well for him.   Review of Systems See HPI  Past Medical History:  Diagnosis Date  . Abnormal myocardial perfusion study 2006   EF 44% ? inferoseptal ischemia. no cath done  . Breast mass, right 03/2008  . Contact dermatitis 01/31/2013  . Diabetes mellitus type II   . History of colonic polyps   . Hyperlipidemia   . Hypertension   . Lipoma   . Low back pain      Social History   Social History  . Marital status: Married    Spouse name: N/A  . Number of children: N/A  . Years of education: N/A   Occupational History  . Not on file.   Social History Main Topics  . Smoking status: Former Research scientist (life sciences)  . Smokeless tobacco: Never Used     Comment: 33 pack year history  . Alcohol use 1.8 oz/week    3 Glasses of wine per week  . Drug use: No  . Sexual activity: Not on file   Other Topics Concern  . 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 COLONOSCOPY W/ POLYPECTOMY    . LIPOMA EXCISION  07/2008    Dr Gershon Crane  . WISDOM TOOTH EXTRACTION      Family History  Problem Relation Age of Onset  . Melanoma Father 15       deceased secondary to melanoma  . Alzheimer's disease Father   . Diabetes Father   . Hypertension Mother        alive -32  . Colon cancer Neg Hx     Allergies  Allergen Reactions  . Amlodipine Besylate     REACTION: ? caused left axillary nodules  . Hydrochlorothiazide Other (See Comments)    ?unknown reaction    Current Outpatient Prescriptions on File Prior to Visit  Medication Sig Dispense Refill  . aspirin 81 MG tablet Take 81 mg by mouth daily.      . Cyanocobalamin (VITAMIN B-12 PO) Take 1 tablet by mouth daily.    . dapagliflozin propanediol (FARXIGA) 5 MG TABS tablet Take 5 mg by mouth daily. 30 tablet 5  . glipiZIDE (GLUCOTROL XL) 10 MG 24 hr tablet TAKE 1 TABLET  BY MOUTH EVERY DAY WITH BREAKFAST 90 tablet 0  . glucose blood (PRODIGY NO CODING BLOOD GLUC) test strip Use as instructed to check blood sugar twice a day.  DX  E11.40 100 each 1  . Insulin Detemir (LEVEMIR FLEXTOUCH) 100 UNIT/ML Pen Inject 45 Units into the skin daily at 10 pm. 15 mL 3  . Insulin Pen Needle (B-D ULTRAFINE III SHORT PEN) 31G X 8 MM MISC USE AS DIRECTED 100 each 5  . lisinopril (PRINIVIL,ZESTRIL) 30 MG tablet Take 1 tablet (30 mg total) by mouth daily. 90 tablet 1  . metFORMIN (GLUCOPHAGE-XR) 500 MG 24 hr tablet Take 2 tablets (1,000 mg total) by mouth 2 (two) times daily. 360 tablet 1  . NIFEdipine (ADALAT CC) 90 MG 24 hr tablet Take 1 tablet (90 mg total) by mouth daily. 90 tablet 0  . NIFEdipine (PROCARDIA-XL/ADALAT CC) 30 MG 24 hr tablet Take 3 tablets (90 mg total) by mouth daily. 90 tablet 1  . NOVOLOG FLEXPEN 100 UNIT/ML FlexPen INJECT 11-15 SUBCUTANEOUSLY DAILY BEFORE SUPPER 15 mL 1  . rosuvastatin (CRESTOR) 20 MG tablet Take 1 tablet (20 mg total) by mouth daily. 30 tablet 2  . tadalafil  (CIALIS) 20 MG tablet One by mouth every 36 hours as needed 6 tablet 11   Current Facility-Administered Medications on File Prior to Visit  Medication Dose Route Frequency Provider Last Rate Last Dose  . 0.9 %  sodium chloride infusion  500 mL Intravenous Continuous Danis, Estill Cotta III, MD        BP (!) 158/89 (BP Location: Right Arm, Cuff Size: Normal)   Pulse 66   Temp 98.2 F (36.8 C) (Oral)   Resp 18   Ht 6\' 5"  (1.956 m)   Wt 192 lb 12.8 oz (87.5 kg)   SpO2 97%   BMI 22.86 kg/m       Objective:   Physical Exam  Constitutional: He is oriented to person, place, and time. He appears well-developed and well-nourished. No distress.  HENT:  Head: Normocephalic and atraumatic.  Cardiovascular: Normal rate and regular rhythm.   No murmur heard. Pulmonary/Chest: Effort normal and breath sounds normal. No respiratory distress. He has no wheezes. He has no rales.  Musculoskeletal: He exhibits no edema.  Lymphadenopathy:    He has no cervical adenopathy.  Neurological: He is alert and oriented to person, place, and time.  Skin: Skin is warm and dry.  Psychiatric: He has a normal mood and affect. His behavior is normal. Thought content normal.          Assessment & Plan:  HTN- BP is elevated, restart nifedipine.  DM2- clinically stable, continue current meds/dosing. Obtain follow up A1C. Prevnar today.   ED- stable on cialis, continue same.

## 2017-02-05 NOTE — Patient Instructions (Signed)
Start nifedipine 30mg  (3 tabs once daily). Complete lab work prior to leaving.

## 2017-02-06 ENCOUNTER — Other Ambulatory Visit: Payer: Self-pay | Admitting: Family

## 2017-02-06 NOTE — Telephone Encounter (Signed)
Sugar is above goal. Increase levemir from 45 to 50 units. Kidney function and electrolytes are normal.

## 2017-02-07 NOTE — Telephone Encounter (Signed)
Left message for pt to return my call.

## 2017-02-11 MED ORDER — INSULIN DETEMIR 100 UNIT/ML FLEXPEN: 50.0000 [IU] | PEN_INJECTOR | Freq: Every day | SUBCUTANEOUS | 3 refills | Status: DC

## 2017-02-11 NOTE — Telephone Encounter (Signed)
Notified pt and he voices understanding. Rx sent.

## 2017-02-24 ENCOUNTER — Telehealth: Payer: Self-pay | Admitting: *Deleted

## 2017-02-24 NOTE — Telephone Encounter (Signed)
Received request for Medical records from Dept of [facility], forwarded to [place] for email/scan/SLS 07/02

## 2017-03-05 ENCOUNTER — Other Ambulatory Visit: Payer: Self-pay | Admitting: Family

## 2017-03-14 ENCOUNTER — Telehealth: Payer: Self-pay | Admitting: Family

## 2017-03-14 NOTE — Telephone Encounter (Signed)
Per review of EPIC, rx was sent 10/29/16, #90 x 1 refill. Pharmacist states rx came through and was placed under different doctors name. They will activate Rx under PCP, O'sullivan and fill rx for pt.

## 2017-03-14 NOTE — Telephone Encounter (Signed)
Asking for refill on  lisinopril (PRINIVIL,ZESTRIL) 30 MG tablet [facility] month supply [facility] [address]

## 2017-03-14 NOTE — Telephone Encounter (Signed)
Attempted to notify pt but received message that voice mailbox is full.

## 2017-03-20 ENCOUNTER — Telehealth: Payer: Self-pay | Admitting: *Deleted

## 2017-03-20 NOTE — Telephone Encounter (Signed)
Received request for records from [place] for evidence and/or information to support a Veteran's claim for benefits. There is no specifics of the request. Will research more/SLS 07/26

## 2017-03-26 NOTE — Telephone Encounter (Signed)
02/24/17 2:14 PM  Note    Received request for Medical records from Dept of [facility], forwarded to [place] for email/scan/SLS 30/94      Duplicate Request/SLS 07/68/08

## 2017-03-28 ENCOUNTER — Other Ambulatory Visit: Payer: Self-pay | Admitting: Family

## 2017-04-14 ENCOUNTER — Telehealth: Payer: Self-pay | Admitting: *Deleted

## 2017-04-14 MED ORDER — INSULIN ASPART 100 UNIT/ML FLEXPEN: PEN_INJECTOR | SUBCUTANEOUS | 1 refills | Status: DC

## 2017-04-14 NOTE — Telephone Encounter (Signed)
[facility] Novolog U100. Refill sent.

## 2017-05-14 ENCOUNTER — Ambulatory Visit: Payer: Medicare Other | Admitting: Family

## 2017-05-14 DIAGNOSIS — Z0289 Encounter for other administrative examinations: Secondary | ICD-10-CM

## 2017-05-20 ENCOUNTER — Encounter: Payer: Self-pay | Admitting: Family

## 2017-05-20 ENCOUNTER — Ambulatory Visit (INDEPENDENT_AMBULATORY_CARE_PROVIDER_SITE_OTHER): Payer: Medicare Other | Admitting: Family

## 2017-05-20 VITALS — BP 134/75 | HR 72 | Temp 98.2°F | Resp 16 | Ht 77.0 in | Wt 192.4 lb

## 2017-05-20 DIAGNOSIS — E1165 Type 2 diabetes mellitus with hyperglycemia: Secondary | ICD-10-CM | POA: Diagnosis not present

## 2017-05-20 DIAGNOSIS — E785 Hyperlipidemia, unspecified: Secondary | ICD-10-CM | POA: Diagnosis not present

## 2017-05-20 DIAGNOSIS — Z23 Encounter for immunization: Secondary | ICD-10-CM | POA: Diagnosis not present

## 2017-05-20 DIAGNOSIS — IMO0001 Reserved for inherently not codable concepts without codable children: Secondary | ICD-10-CM

## 2017-05-20 LAB — BASIC METABOLIC PANEL
BUN: 14 mg/dL (ref 6–23)
CALCIUM: 9.5 mg/dL (ref 8.4–10.5)
CO2: 29 meq/L (ref 19–32)
CREATININE: 0.85 mg/dL (ref 0.40–1.50)
Chloride: 105 mEq/L (ref 96–112)
GFR: 116.07 mL/min (ref >60.00)
Glucose, Bld: 117 mg/dL — ABNORMAL HIGH (ref 70–99)
Potassium: 4.3 mEq/L (ref 3.5–5.1)
Sodium: 139 mEq/L (ref 135–145)

## 2017-05-20 LAB — LIPID PANEL
CHOLESTEROL: 137 mg/dL (ref 0–200)
HDL: 42.1 mg/dL (ref >39.00)
LDL CALC: 83 mg/dL (ref 0–99)
NONHDL: 95
Total CHOL/HDL Ratio: 3
Triglycerides: 60 mg/dL (ref 0.0–149.0)
VLDL: 12 mg/dL (ref 0.0–40.0)

## 2017-05-20 LAB — HEMOGLOBIN A1C: HEMOGLOBIN A1C: 8.1 % — AB (ref 4.6–6.5)

## 2017-05-20 MED ORDER — METFORMIN HCL ER 500 MG PO TB24: 1000.0000 mg | ORAL_TABLET | Freq: Two times a day (BID) | ORAL | 1 refills | Status: DC

## 2017-05-20 MED ORDER — ZOSTER VAC RECOMB ADJUVANTED 50 MCG/0.5ML IM SUSR: INTRAMUSCULAR | 1 refills | Status: DC

## 2017-05-20 NOTE — Progress Notes (Signed)
Subjective:    Patient ID: [name], male    DOB: 02/21/1951, 66 y.o.   MRN: [id number]  HPI   [name] is a 66 yr old male who presents today for follow up.  1) DM2- continues levemir 50 units,  Novolog.  11-14 units prior to dinner based on sugar.also maintained on farxiga and glucotrol.   He reports that his sugar has been running 86-114 fasting.   Denies sugars <80. At dinnertime, sugars are around 130.   Lab Results  Component Value Date   HGBA1C 8.8 (H) 02/05/2017   HGBA1C 8.2 (H) 11/06/2016   HGBA1C 9.1 (H) 07/31/2016   Lab Results  Component Value Date   MICROALBUR <0.7 03/02/2015   LDLCALC 83 07/31/2016   CREATININE 0.82 02/05/2017   2) HTN- restarted lisinopril and nifedipine on Sunday.   BP Readings from Last 3 Encounters:  05/20/17 134/75  02/05/17 (!) 158/89  11/20/16 139/79       Review of Systems See HPI  Past Medical History:  Diagnosis Date  . Abnormal myocardial perfusion study 2006   EF 44% ? inferoseptal ischemia. no cath done  . Breast mass, right 03/2008  . Contact dermatitis 01/31/2013  . Diabetes mellitus type II   . History of colonic polyps   . Hyperlipidemia   . Hypertension   . Lipoma   . Low back pain      Social History   Social History  . Marital status: Married    Spouse name: N/A  . Number of children: N/A  . Years of education: N/A   Occupational History  . Not on file.   Social History Main Topics  . Smoking status: Former Research scientist (life sciences)  . Smokeless tobacco: Never Used     Comment: 33 pack year history  . Alcohol use 1.8 oz/week    3 Glasses of wine per week  . Drug use: No  . Sexual activity: Not on file   Other Topics Concern  . 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 COLONOSCOPY W/ POLYPECTOMY    . LIPOMA  EXCISION  07/2008    Dr Gershon Crane  . WISDOM TOOTH EXTRACTION      Family History  Problem Relation Age of Onset  . Melanoma Father 53       deceased secondary to melanoma  . Alzheimer's disease Father   . Diabetes Father   . Hypertension Mother        alive -56  . Colon cancer Neg Hx     Allergies  Allergen Reactions  . Amlodipine Besylate     REACTION: ? caused left axillary nodules  . Hydrochlorothiazide Other (See Comments)    ?unknown reaction    Current Outpatient Prescriptions on File Prior to Visit  Medication Sig Dispense Refill  . aspirin 81 MG tablet Take 81 mg by mouth daily.      . Cyanocobalamin (VITAMIN B-12 PO) Take 1 tablet by mouth daily.    . dapagliflozin propanediol (FARXIGA) 5 MG TABS tablet Take 5 mg by mouth daily. 30 tablet 5  . glipiZIDE (GLUCOTROL XL) 10 MG 24 hr tablet TAKE 1 TABLET BY MOUTH EVERY DAY WITH BREAKFAST  90 tablet 0  . glucose blood (PRODIGY NO CODING BLOOD GLUC) test strip Use as instructed to check blood sugar twice a day.  DX  E11.40 100 each 1  . insulin aspart (NOVOLOG FLEXPEN) 100 UNIT/ML FlexPen INJECT 11-15 SUBCUTANEOUSLY DAILY BEFORE SUPPER 15 mL 1  . Insulin Detemir (LEVEMIR FLEXTOUCH) 100 UNIT/ML Pen Inject 50 Units into the skin daily at 10 pm. 15 mL 3  . Insulin Pen Needle (B-D ULTRAFINE III SHORT PEN) 31G X 8 MM MISC USE AS DIRECTED 100 each 5  . lisinopril (PRINIVIL,ZESTRIL) 30 MG tablet Take 1 tablet (30 mg total) by mouth daily. 90 tablet 1  . NIFEdipine (PROCARDIA-XL/ADALAT CC) 30 MG 24 hr tablet TAKE 3 TABLETS (90 MG TOTAL) BY MOUTH DAILY. 90 tablet 3  . rosuvastatin (CRESTOR) 20 MG tablet TAKE 1 TABLET (20 MG TOTAL) BY MOUTH DAILY. 30 tablet 5  . tadalafil (CIALIS) 20 MG tablet One by mouth every 36 hours as needed 6 tablet 11   Current Facility-Administered Medications on File Prior to Visit  Medication Dose Route Frequency Provider Last Rate Last Dose  . 0.9 %  sodium chloride infusion  500 mL Intravenous Continuous  Danis, Estill Cotta III, MD        BP 134/75   Pulse 72   Temp 98.2 F (36.8 C) (Oral)   Resp 16   Ht 6\' 5"  (1.956 m)   Wt 192 lb 6.4 oz (87.3 kg)   SpO2 97%   BMI 22.82 kg/m       Objective:   Physical Exam  Constitutional: He is oriented to person, place, and time. He appears well-developed and well-nourished. No distress.  HENT:  Head: Normocephalic and atraumatic.  Cardiovascular: Normal rate and regular rhythm.   No murmur heard. Pulmonary/Chest: Effort normal and breath sounds normal. No respiratory distress. He has no wheezes. He has no rales.  Musculoskeletal: He exhibits no edema.  Neurological: He is alert and oriented to person, place, and time.  Skin: Skin is warm and dry.  Psychiatric: He has a normal mood and affect. His behavior is normal. Thought content normal.          Assessment & Plan:  DM2- home sugars appear controlled. Continue current dosing and obtain follow up a1c.  HTN- follow up bp looks better. Continue current meds.   Hyperlipidemia- on statin, obtain follow up lipid panel.   Flu shot today.

## 2017-05-20 NOTE — Addendum Note (Signed)
Addended by: Kelle Darting A on: 05/20/2017 11:58 AM   Modules accepted: Orders

## 2017-05-20 NOTE — Patient Instructions (Signed)
Please complete lab work prior to leaving. A prescription for the shingles vaccine has been sent to your pharmacy. The pharmacist can administer this and you will need to return to your pharmacy in 2-6 months for a second injection.

## 2017-05-22 ENCOUNTER — Telehealth: Payer: Self-pay | Admitting: Family

## 2017-05-22 NOTE — Telephone Encounter (Signed)
Please let patient know that her sugar is improving however it still above goal. I would like him to check his sugar after meals as well as fasting. Please call me in one week with his readings.

## 2017-05-22 NOTE — Telephone Encounter (Signed)
Pt aware of results/will call in 1 week with pp readings/thx dmf

## 2017-06-27 ENCOUNTER — Telehealth: Payer: Self-pay | Admitting: *Deleted

## 2017-06-27 MED ORDER — INSULIN DETEMIR 100 UNIT/ML FLEXPEN: 50.0000 [IU] | PEN_INJECTOR | Freq: Every day | SUBCUTANEOUS | 3 refills | Status: DC

## 2017-06-27 NOTE — Telephone Encounter (Signed)
[facility]. Rx sent.

## 2017-07-10 ENCOUNTER — Telehealth: Payer: Self-pay | Admitting: Family

## 2017-07-14 NOTE — Telephone Encounter (Signed)
Pt's spouse called in to follow up on refill request. She says that pt is completely out of medication and need it as soon as possible.

## 2017-07-14 NOTE — Telephone Encounter (Addendum)
Pt states pharmacy said they never received our prescription from 07/11/17. Spoke with pharmacist and she verified that Rx is on hold. Advised her that pt needs to pick up Rx today and she states they will get it filled. Pt notified.

## 2017-08-11 ENCOUNTER — Ambulatory Visit (INDEPENDENT_AMBULATORY_CARE_PROVIDER_SITE_OTHER): Payer: Medicare Other | Admitting: Family

## 2017-08-11 ENCOUNTER — Encounter: Payer: Self-pay | Admitting: Family

## 2017-08-11 VITALS — BP 122/70 | HR 84 | Temp 98.2°F | Resp 14 | Ht 77.0 in | Wt 189.0 lb

## 2017-08-11 DIAGNOSIS — I10 Essential (primary) hypertension: Secondary | ICD-10-CM

## 2017-08-11 DIAGNOSIS — E1165 Type 2 diabetes mellitus with hyperglycemia: Secondary | ICD-10-CM | POA: Diagnosis not present

## 2017-08-11 DIAGNOSIS — E785 Hyperlipidemia, unspecified: Secondary | ICD-10-CM

## 2017-08-11 DIAGNOSIS — IMO0002 Reserved for concepts with insufficient information to code with codable children: Secondary | ICD-10-CM

## 2017-08-11 DIAGNOSIS — E1140 Type 2 diabetes mellitus with diabetic neuropathy, unspecified: Secondary | ICD-10-CM

## 2017-08-11 MED ORDER — METFORMIN HCL ER 500 MG PO TB24: 1000.0000 mg | ORAL_TABLET | Freq: Two times a day (BID) | ORAL | 1 refills | Status: DC

## 2017-08-11 MED ORDER — LISINOPRIL 30 MG PO TABS: 30.0000 mg | ORAL_TABLET | Freq: Every day | ORAL | 1 refills | Status: DC

## 2017-08-11 MED ORDER — GLIPIZIDE ER 10 MG PO TB24: 10.0000 mg | ORAL_TABLET | Freq: Every day | ORAL | 1 refills | Status: DC

## 2017-08-11 MED ORDER — INSULIN ASPART 100 UNIT/ML FLEXPEN: PEN_INJECTOR | SUBCUTANEOUS | 1 refills | Status: DC

## 2017-08-11 MED ORDER — ROSUVASTATIN CALCIUM 20 MG PO TABS: 20.0000 mg | ORAL_TABLET | Freq: Every day | ORAL | 5 refills | Status: DC

## 2017-08-11 MED ORDER — NIFEDIPINE ER 30 MG PO TB24: 90.0000 mg | ORAL_TABLET | Freq: Every day | ORAL | 3 refills | Status: DC

## 2017-08-11 MED ORDER — DAPAGLIFLOZIN PROPANEDIOL 5 MG PO TABS: 5.0000 mg | ORAL_TABLET | Freq: Every day | ORAL | 5 refills | Status: DC

## 2017-08-11 MED ORDER — INSULIN DETEMIR 100 UNIT/ML FLEXPEN: 50.0000 [IU] | PEN_INJECTOR | Freq: Every day | SUBCUTANEOUS | 3 refills | Status: DC

## 2017-08-11 NOTE — Assessment & Plan Note (Signed)
At goal on crestor, continue same.

## 2017-08-11 NOTE — Patient Instructions (Addendum)
Please return for blood work after 12/25.

## 2017-08-11 NOTE — Assessment & Plan Note (Signed)
Well controlled per home numbers. Obtain follow up a1c.

## 2017-08-11 NOTE — Assessment & Plan Note (Signed)
Stable on current meds continue same 

## 2017-08-11 NOTE — Progress Notes (Signed)
Subjective:    Patient ID: [name], male    DOB: [birth year], 66 y.o.   MRN: [id number]  HPI   [name] is a 66 yr old male who presents today for follow up.  1) DM2- on farxiga, glipizide,  levemir 50 units.  Uses novolog ac dinner- uses 10 units. Denies hypoglycemia.   Lab Results  Component Value Date   HGBA1C 8.1 (H) 05/20/2017   HGBA1C 8.8 (H) 02/05/2017   HGBA1C 8.2 (H) 11/06/2016   Lab Results  Component Value Date   MICROALBUR <0.7 03/02/2015   LDLCALC 83 05/20/2017   CREATININE 0.85 05/20/2017    2) HTN- on procardia, lisinopril.  BP Readings from Last 3 Encounters:  08/11/17 122/70  05/20/17 134/75  02/05/17 (!) 158/89   3) Hyperlipidemia- stable on crestor, denies myalgia.  Lab Results  Component Value Date   CHOL 137 05/20/2017   HDL 42.10 05/20/2017   LDLCALC 83 05/20/2017   LDLDIRECT 177.6 10/07/2007   TRIG 60.0 05/20/2017   CHOLHDL 3 05/20/2017       Review of Systems See HPI  Past Medical History:  Diagnosis Date  . Abnormal myocardial perfusion study 2006   EF 44% ? inferoseptal ischemia. no cath done  . Breast mass, right 03/2008  . Contact dermatitis 01/31/2013  . Diabetes mellitus type II   . History of colonic polyps   . Hyperlipidemia   . Hypertension   . Lipoma   . Low back pain      Social History   Socioeconomic History  . Marital status: Married    Spouse name: Not on file  . Number of children: Not on file  . Years of education: Not on file  . Highest education level: Not on file  Social Needs  . Financial resource strain: Not on file  . Food insecurity - worry: Not on file  . Food insecurity - inability: Not on file  . Transportation needs - medical: Not on file  . Transportation needs - non-medical: Not on file  Occupational History  . Not on file  Tobacco Use  . Smoking status: Former Research scientist (life sciences)  . Smokeless tobacco: Never Used  . Tobacco comment: 33 pack year history  Substance and Sexual  Activity  . Alcohol use: Yes    Alcohol/week: 1.8 oz    Types: 3 Glasses of wine per week  . Drug use: No  . Sexual activity: Not on file  Other Topics Concern  . 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 COLONOSCOPY W/ POLYPECTOMY    . LIPOMA EXCISION  07/2008    Dr Gershon Crane  . WISDOM TOOTH EXTRACTION      Family History  Problem Relation Age of Onset  . Melanoma Father 35       deceased secondary to melanoma  . Alzheimer's disease Father   . Diabetes Father   . Hypertension Mother        alive -11  . Colon cancer Neg Hx     Allergies  Allergen Reactions  . Amlodipine Besylate     REACTION: ? caused left axillary nodules  . Hydrochlorothiazide Other (See Comments)    ?unknown reaction  Current Outpatient Medications on File Prior to Visit  Medication Sig Dispense Refill  . aspirin 81 MG tablet Take 81 mg by mouth daily.      . Cyanocobalamin (VITAMIN B-12 PO) Take 1 tablet by mouth daily.    . dapagliflozin propanediol (FARXIGA) 5 MG TABS tablet Take 5 mg by mouth daily. 30 tablet 5  . glipiZIDE (GLUCOTROL XL) 10 MG 24 hr tablet TAKE 1 TABLET BY MOUTH EVERY DAY WITH BREAKFAST 90 tablet 1  . glucose blood (PRODIGY NO CODING BLOOD GLUC) test strip Use as instructed to check blood sugar twice a day.  DX  E11.40 100 each 1  . insulin aspart (NOVOLOG FLEXPEN) 100 UNIT/ML FlexPen INJECT 11-15 SUBCUTANEOUSLY DAILY BEFORE SUPPER 15 mL 1  . Insulin Detemir (LEVEMIR FLEXTOUCH) 100 UNIT/ML Pen Inject 50 Units into the skin daily at 10 pm. 15 mL 3  . Insulin Pen Needle (B-D ULTRAFINE III SHORT PEN) 31G X 8 MM MISC USE AS DIRECTED 100 each 5  . lisinopril (PRINIVIL,ZESTRIL) 30 MG tablet Take 1 tablet (30 mg total) by mouth daily. 90 tablet 1  . metFORMIN (GLUCOPHAGE-XR) 500 MG 24 hr tablet Take  2 tablets (1,000 mg total) by mouth 2 (two) times daily. 360 tablet 1  . NIFEdipine (PROCARDIA-XL/ADALAT CC) 30 MG 24 hr tablet TAKE 3 TABLETS (90 MG TOTAL) BY MOUTH DAILY. 90 tablet 3  . rosuvastatin (CRESTOR) 20 MG tablet TAKE 1 TABLET (20 MG TOTAL) BY MOUTH DAILY. 30 tablet 5  . tadalafil (CIALIS) 20 MG tablet One by mouth every 36 hours as needed 6 tablet 11   Current Facility-Administered Medications on File Prior to Visit  Medication Dose Route Frequency Provider Last Rate Last Dose  . 0.9 %  sodium chloride infusion  500 mL Intravenous Continuous Danis, Estill Cotta III, MD        BP 122/70 (BP Location: Left Arm, Cuff Size: Large)   Pulse 84   Temp 98.2 F (36.8 C) (Oral)   Resp 14   Ht 6\' 5"  (1.956 m)   Wt 189 lb (85.7 kg)   SpO2 97%   BMI 22.41 kg/m       Objective:   Physical Exam  Constitutional: He is oriented to person, place, and time. He appears well-developed and well-nourished. No distress.  HENT:  Head: Normocephalic and atraumatic.  Eyes: No scleral icterus.  Cardiovascular: Normal rate and regular rhythm.  No murmur heard. Pulmonary/Chest: Effort normal and breath sounds normal. No respiratory distress. He has no wheezes. He has no rales.  Musculoskeletal: He exhibits no edema.  Lymphadenopathy:    He has no cervical adenopathy.  Neurological: He is alert and oriented to person, place, and time.  Skin: Skin is warm and dry.  Psychiatric: He has a normal mood and affect. His behavior is normal. Thought content normal.          Assessment & Plan:

## 2017-08-20 ENCOUNTER — Other Ambulatory Visit (INDEPENDENT_AMBULATORY_CARE_PROVIDER_SITE_OTHER): Payer: Medicare Other

## 2017-08-20 DIAGNOSIS — E1165 Type 2 diabetes mellitus with hyperglycemia: Secondary | ICD-10-CM | POA: Diagnosis not present

## 2017-08-20 LAB — COMPREHENSIVE METABOLIC PANEL
ALBUMIN: 4.3 g/dL (ref 3.5–5.2)
ALT: 10 U/L (ref 0–53)
AST: 12 U/L (ref 0–37)
Alkaline Phosphatase: 44 U/L (ref 39–117)
BILIRUBIN TOTAL: 0.5 mg/dL (ref 0.2–1.2)
BUN: 13 mg/dL (ref 6–23)
CHLORIDE: 105 meq/L (ref 96–112)
CO2: 27 mEq/L (ref 19–32)
CREATININE: 0.9 mg/dL (ref 0.40–1.50)
Calcium: 9.1 mg/dL (ref 8.4–10.5)
GFR: 108.58 mL/min (ref >60.00)
Glucose, Bld: 92 mg/dL (ref 70–99)
Potassium: 3.8 mEq/L (ref 3.5–5.1)
SODIUM: 141 meq/L (ref 135–145)
Total Protein: 7.2 g/dL (ref 6.0–8.3)

## 2017-08-20 LAB — MICROALBUMIN / CREATININE URINE RATIO
CREATININE, U: 148.5 mg/dL
Microalb Creat Ratio: 0.6 mg/g (ref 0.0–30.0)
Microalb, Ur: 0.9 mg/dL (ref 0.0–1.9)

## 2017-08-20 LAB — HEMOGLOBIN A1C: Hgb A1c MFr Bld: 8.2 % — ABNORMAL HIGH (ref 4.6–6.5)

## 2017-08-21 ENCOUNTER — Telehealth: Payer: Self-pay | Admitting: Family

## 2017-08-21 MED ORDER — INSULIN DETEMIR 100 UNIT/ML FLEXPEN: 55.0000 [IU] | PEN_INJECTOR | Freq: Every day | SUBCUTANEOUS | 3 refills | Status: DC

## 2017-08-21 NOTE — Telephone Encounter (Signed)
Tried calling Pt- no answer, no voicemail set up.

## 2017-08-21 NOTE — Telephone Encounter (Signed)
Sugar is above goal.  Please increase levemir from 50 units to 55 units.

## 2017-08-21 NOTE — Telephone Encounter (Signed)
Spoke w/ Pt, informed of lab results and recommendations. Pt verbalized understanding.

## 2017-08-27 DIAGNOSIS — E119 Type 2 diabetes mellitus without complications: Secondary | ICD-10-CM | POA: Diagnosis not present

## 2017-08-27 LAB — HM DIABETES EYE EXAM

## 2017-09-12 ENCOUNTER — Encounter: Payer: Self-pay | Admitting: Family

## 2017-12-01 ENCOUNTER — Other Ambulatory Visit: Payer: Self-pay | Admitting: Family

## 2017-12-15 ENCOUNTER — Ambulatory Visit (INDEPENDENT_AMBULATORY_CARE_PROVIDER_SITE_OTHER): Payer: Medicare Other | Admitting: Family

## 2017-12-15 ENCOUNTER — Telehealth: Payer: Self-pay | Admitting: Family

## 2017-12-15 ENCOUNTER — Encounter: Payer: Self-pay | Admitting: Family

## 2017-12-15 VITALS — BP 140/81 | HR 70 | Temp 98.2°F | Resp 16 | Ht 77.0 in | Wt 189.8 lb

## 2017-12-15 DIAGNOSIS — R011 Cardiac murmur, unspecified: Secondary | ICD-10-CM

## 2017-12-15 DIAGNOSIS — E785 Hyperlipidemia, unspecified: Secondary | ICD-10-CM

## 2017-12-15 DIAGNOSIS — N529 Male erectile dysfunction, unspecified: Secondary | ICD-10-CM

## 2017-12-15 DIAGNOSIS — I10 Essential (primary) hypertension: Secondary | ICD-10-CM

## 2017-12-15 DIAGNOSIS — E1165 Type 2 diabetes mellitus with hyperglycemia: Secondary | ICD-10-CM | POA: Diagnosis not present

## 2017-12-15 LAB — BASIC METABOLIC PANEL
BUN: 12 mg/dL (ref 6–23)
CHLORIDE: 106 meq/L (ref 96–112)
CO2: 26 mEq/L (ref 19–32)
Calcium: 9.2 mg/dL (ref 8.4–10.5)
Creatinine, Ser: 0.86 mg/dL (ref 0.40–1.50)
GFR: 114.32 mL/min (ref >60.00)
GLUCOSE: 112 mg/dL — AB (ref 70–99)
POTASSIUM: 4.6 meq/L (ref 3.5–5.1)
SODIUM: 140 meq/L (ref 135–145)

## 2017-12-15 LAB — HEMOGLOBIN A1C: HEMOGLOBIN A1C: 7.9 % — AB (ref 4.6–6.5)

## 2017-12-15 NOTE — Patient Instructions (Signed)
Please complete lab work prior to leaving.   

## 2017-12-15 NOTE — Telephone Encounter (Signed)
Opened in error

## 2017-12-15 NOTE — Progress Notes (Signed)
Subjective:    Patient ID: [name], male    DOB: [birth year], 67 y.o.   MRN: [id number]  HPI  [name] is a 67 year old male who presents today for routine follow-up.  DM2- On farxiga, levemir 55 units, glucotrol, metformin. Using novolog 11 units AC dinner which is his largest meal of the day. Reports that he has been checking sugars 90-110, highs in the 150's. Denies hypoglycemia.  Lab Results  Component Value Date   HGBA1C 8.2 (H) 08/20/2017   HGBA1C 8.1 (H) 05/20/2017   HGBA1C 8.8 (H) 02/05/2017   Lab Results  Component Value Date   MICROALBUR 0.9 08/20/2017   LDLCALC 83 05/20/2017   CREATININE 0.90 08/20/2017   HTN- on procardia and lisinopril. Denies swelling.  BP Readings from Last 3 Encounters:  12/15/17 140/81  08/11/17 122/70  05/20/17 134/75   Hyperlipidemia- denies myalgia. Continue crestor.  Lab Results  Component Value Date   CHOL 137 05/20/2017   HDL 42.10 05/20/2017   LDLCALC 83 05/20/2017   LDLDIRECT 177.6 10/07/2007   TRIG 60.0 05/20/2017   CHOLHDL 3 05/20/2017   ED- uses cialis prn.   Review of Systems See HPI  Past Medical History:  Diagnosis Date  . Abnormal myocardial perfusion study 2006   EF 44% ? inferoseptal ischemia. no cath done  . Breast mass, right 03/2008  . Contact dermatitis 01/31/2013  . Diabetes mellitus type II   . History of colonic polyps   . Hyperlipidemia   . Hypertension   . Lipoma   . Low back pain      Social History   Socioeconomic History  . Marital status: Married    Spouse name: Not on file  . Number of children: Not on file  . Years of education: Not on file  . Highest education level: Not on file  Occupational History  . Not on file  Social Needs  . Financial resource strain: Not on file  . Food insecurity:    Worry: Not on file    Inability: Not on file  . Transportation needs:    Medical: Not on file    Non-medical: Not on file  Tobacco Use  . Smoking status: Former Research scientist (life sciences)  .  Smokeless tobacco: Never Used  . Tobacco comment: 33 pack year history  Substance and Sexual Activity  . Alcohol use: Yes    Alcohol/week: 1.8 oz    Types: 3 Glasses of wine per week  . Drug use: No  . Sexual activity: Not on file  Lifestyle  . Physical activity:    Days per week: Not on file    Minutes per session: Not on file  . Stress: Not on file  Relationships  . Social connections:    Talks on phone: Not on file    Gets together: Not on file    Attends religious service: Not on file    Active member of club or organization: Not on file    Attends meetings of clubs or organizations: Not on file    Relationship status: Not on file  . Intimate partner violence:    Fear of current or ex partner: Not on file    Emotionally abused: Not on file    Physically abused: Not on file    Forced sexual activity: Not on file  Other Topics Concern  . 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 COLONOSCOPY W/ POLYPECTOMY    . LIPOMA EXCISION  07/2008    Dr Gershon Crane  . WISDOM TOOTH EXTRACTION      Family History  Problem Relation Age of Onset  . Melanoma Father 22       deceased secondary to melanoma  . Alzheimer's disease Father   . Diabetes Father   . Hypertension Mother        alive -61  . Colon cancer Neg Hx     Allergies  Allergen Reactions  . Amlodipine Besylate     REACTION: ? caused left axillary nodules  . Hydrochlorothiazide Other (See Comments)    ?unknown reaction    Current Outpatient Medications on File Prior to Visit  Medication Sig Dispense Refill  . aspirin 81 MG tablet Take 81 mg by mouth daily.      . Cyanocobalamin (VITAMIN B-12 PO) Take 1 tablet by mouth daily.    . dapagliflozin propanediol (FARXIGA) 5 MG TABS tablet Take 5 mg by mouth daily. 30 tablet 5  .  glipiZIDE (GLUCOTROL XL) 10 MG 24 hr tablet Take 1 tablet (10 mg total) by mouth daily with breakfast. 90 tablet 1  . glucose blood (PRODIGY NO CODING BLOOD GLUC) test strip Use as instructed to check blood sugar twice a day.  DX  E11.40 100 each 1  . Insulin Pen Needle (B-D ULTRAFINE III SHORT PEN) 31G X 8 MM MISC USE AS DIRECTED 100 each 5  . LEVEMIR FLEXTOUCH 100 UNIT/ML Pen Inject 55 Units into the skin every morning.    Marland Kitchen lisinopril (PRINIVIL,ZESTRIL) 30 MG tablet Take 1 tablet (30 mg total) by mouth daily. 90 tablet 1  . metFORMIN (GLUCOPHAGE-XR) 500 MG 24 hr tablet Take 2 tablets (1,000 mg total) by mouth 2 (two) times daily. 360 tablet 1  . NIFEdipine (PROCARDIA-XL/ADALAT CC) 30 MG 24 hr tablet Take 3 tablets (90 mg total) by mouth daily. 90 tablet 3  . NOVOLOG FLEXPEN 100 UNIT/ML FlexPen INJECT 11-15 SUBCUTANEOUSLY DAILY BEFORE SUPPER 15 mL 1  . rosuvastatin (CRESTOR) 20 MG tablet Take 1 tablet (20 mg total) by mouth daily. 30 tablet 5  . tadalafil (CIALIS) 20 MG tablet One by mouth every 36 hours as needed 6 tablet 11   Current Facility-Administered Medications on File Prior to Visit  Medication Dose Route Frequency Provider Last Rate Last Dose  . 0.9 %  sodium chloride infusion  500 mL Intravenous Continuous Danis, Estill Cotta III, MD        BP 140/81 (BP Location: Left Arm, Cuff Size: Normal)   Pulse 70   Temp 98.2 F (36.8 C) (Oral)   Resp 16   Ht 6\' 5"  (1.956 m)   Wt 189 lb 12.8 oz (86.1 kg)   SpO2 98%   BMI 22.51 kg/m       Objective:   Physical Exam  Constitutional: He is oriented to person, place, and time. He appears well-developed and well-nourished. No distress.  HENT:  Head: Normocephalic and atraumatic.  Cardiovascular: Normal rate and regular rhythm.  Murmur heard.  Systolic murmur is present with a grade of 2/6. Pulmonary/Chest: Effort normal and breath sounds normal. No respiratory distress. He has no wheezes. He has no rales.  Musculoskeletal: He exhibits  no edema.  Neurological: He is  alert and oriented to person, place, and time.  Skin: Skin is warm and dry.  Psychiatric: He has a normal mood and affect. His behavior is normal. Thought content normal.          Assessment & Plan:  Hypertension-blood pressure is stable for his age.  Continue current meds.  Diabetes type 2-reports stable blood sugars.  Continue current medication will obtain follow-up A1c.  Will need Pneumovax 23 booster next visit.  Plan to wait a full 1 year from his Prevnar vaccine for insurance coverage purposes.  Hyperlipidemia- LDL at goal.  Tolerating statin.  Continue same.  ED-stable with as needed use of Cialis.  He does note some nausea when he takes the Cialis.  Advised him to try cutting the tablet in half to see if this helps with his nausea symptoms.  Murmur-new.  Question possible aortic stenosis.  Will obtain 2D echo to further evaluate.

## 2017-12-17 ENCOUNTER — Telehealth: Payer: Self-pay | Admitting: Family

## 2017-12-17 NOTE — Telephone Encounter (Signed)
Please contact pt and let him know that his lab work shows sugar slightly improved but still above goal. Please increase levemir from 55-60 units.

## 2017-12-17 NOTE — Telephone Encounter (Signed)
Advised patient of results and plan. He will increase his medication as indicated.

## 2017-12-24 ENCOUNTER — Other Ambulatory Visit: Payer: Self-pay | Admitting: *Deleted

## 2017-12-24 ENCOUNTER — Telehealth: Payer: Self-pay | Admitting: Family

## 2017-12-24 MED ORDER — INSULIN ASPART 100 UNIT/ML FLEXPEN: PEN_INJECTOR | SUBCUTANEOUS | 3 refills | Status: DC

## 2017-12-24 NOTE — Telephone Encounter (Signed)
Copied from [place] [id number]. Topic: Quick Communication - Rx Refill/Question >> Dec 24, 2017  8:19 AM Synthia Innocent wrote: Medication: NOVOLOG FLEXPEN 100 UNIT/ML FlexPen, patient is almost out, requesting call once sent Has the patient contacted their pharmacy? Yes.   (Agent: If no, request that the patient contact the pharmacy for the refill.) Preferred Pharmacy (with phone number or street name): [facility] on [address]  Agent: Please be advised that RX refills may take up to 3 business days. We ask that you follow-up with your pharmacy.

## 2017-12-24 NOTE — Telephone Encounter (Signed)
Rx refilled per protocol. LOV/A1C 12/15/17

## 2017-12-29 ENCOUNTER — Emergency Department (HOSPITAL_COMMUNITY): Payer: Medicare Other

## 2017-12-29 ENCOUNTER — Encounter (HOSPITAL_COMMUNITY): Payer: Self-pay | Admitting: Emergency Medicine

## 2017-12-29 ENCOUNTER — Emergency Department (HOSPITAL_COMMUNITY)
Admission: EM | Admit: 2017-12-29 | Discharge: 2017-12-29 | Disposition: A | Discharge status: Home / Self Care | Payer: Medicare Other | Attending: Emergency Medicine | Admitting: Emergency Medicine

## 2017-12-29 ENCOUNTER — Other Ambulatory Visit: Payer: Self-pay

## 2017-12-29 DIAGNOSIS — I10 Essential (primary) hypertension: Secondary | ICD-10-CM | POA: Insufficient documentation

## 2017-12-29 DIAGNOSIS — Z794 Long term (current) use of insulin: Secondary | ICD-10-CM | POA: Insufficient documentation

## 2017-12-29 DIAGNOSIS — I959 Hypotension, unspecified: Secondary | ICD-10-CM | POA: Insufficient documentation

## 2017-12-29 DIAGNOSIS — Z7982 Long term (current) use of aspirin: Secondary | ICD-10-CM | POA: Diagnosis not present

## 2017-12-29 DIAGNOSIS — Z87891 Personal history of nicotine dependence: Secondary | ICD-10-CM | POA: Insufficient documentation

## 2017-12-29 DIAGNOSIS — R55 Syncope and collapse: Secondary | ICD-10-CM | POA: Diagnosis not present

## 2017-12-29 DIAGNOSIS — R0602 Shortness of breath: Secondary | ICD-10-CM | POA: Diagnosis not present

## 2017-12-29 DIAGNOSIS — R011 Cardiac murmur, unspecified: Secondary | ICD-10-CM | POA: Diagnosis not present

## 2017-12-29 DIAGNOSIS — Z79899 Other long term (current) drug therapy: Secondary | ICD-10-CM | POA: Diagnosis not present

## 2017-12-29 DIAGNOSIS — E1165 Type 2 diabetes mellitus with hyperglycemia: Secondary | ICD-10-CM | POA: Insufficient documentation

## 2017-12-29 DIAGNOSIS — R531 Weakness: Secondary | ICD-10-CM | POA: Diagnosis present

## 2017-12-29 LAB — CBC WITH DIFFERENTIAL/PLATELET
BASOS PCT: 0 %
Basophils Absolute: 0 10*3/uL (ref 0.0–0.1)
Eosinophils Absolute: 0.1 10*3/uL (ref 0.0–0.7)
Eosinophils Relative: 1 %
HEMATOCRIT: 41.4 % (ref 39.0–52.0)
HEMOGLOBIN: 12.9 g/dL — AB (ref 13.0–17.0)
LYMPHS ABS: 1 10*3/uL (ref 0.7–4.0)
Lymphocytes Relative: 11 %
MCH: 23.3 pg — AB (ref 26.0–34.0)
MCHC: 31.2 g/dL (ref 30.0–36.0)
MCV: 74.9 fL — ABNORMAL LOW (ref 78.0–100.0)
MONOS PCT: 6 %
Monocytes Absolute: 0.5 10*3/uL (ref 0.1–1.0)
NEUTROS ABS: 7.8 10*3/uL — AB (ref 1.7–7.7)
NEUTROS PCT: 82 %
Platelets: 367 10*3/uL (ref 150–400)
RBC: 5.53 MIL/uL (ref 4.22–5.81)
RDW: 15.6 % — ABNORMAL HIGH (ref 11.5–15.5)
WBC: 9.4 10*3/uL (ref 4.0–10.5)

## 2017-12-29 LAB — COMPREHENSIVE METABOLIC PANEL
ALBUMIN: 3.5 g/dL (ref 3.5–5.0)
ALK PHOS: 44 U/L (ref 38–126)
ALT: 14 U/L — ABNORMAL LOW (ref 17–63)
ANION GAP: 7 (ref 5–15)
AST: 22 U/L (ref 15–41)
BUN: 11 mg/dL (ref 6–20)
CHLORIDE: 108 mmol/L (ref 101–111)
CO2: 25 mmol/L (ref 22–32)
Calcium: 8.3 mg/dL — ABNORMAL LOW (ref 8.9–10.3)
Creatinine, Ser: 0.95 mg/dL (ref 0.61–1.24)
GFR calc non Af Amer: >60 mL/min (ref >60)
Glucose, Bld: 244 mg/dL — ABNORMAL HIGH (ref 65–99)
Potassium: 4.5 mmol/L (ref 3.5–5.1)
SODIUM: 140 mmol/L (ref 135–145)
Total Bilirubin: 0.6 mg/dL (ref 0.3–1.2)
Total Protein: 6 g/dL — ABNORMAL LOW (ref 6.5–8.1)

## 2017-12-29 LAB — CK: Total CK: 131 U/L (ref 49–397)

## 2017-12-29 MED ORDER — SODIUM CHLORIDE 0.9 % IV BOLUS
1000.0000 mL | Freq: Once | INTRAVENOUS | Status: AC
  Administered 2017-12-29: 1000 mL via INTRAVENOUS

## 2017-12-29 NOTE — ED Notes (Signed)
Patient given bag meals with sprite O

## 2017-12-29 NOTE — ED Notes (Signed)
ED Provider at bedside. 

## 2017-12-29 NOTE — Discharge Instructions (Addendum)
Take all of your medication as directed.  Make sure you are getting plenty of fluids and and eat regularly.  Watch your blood sugar.  See your medical provider tomorrow as planned.

## 2017-12-29 NOTE — ED Notes (Signed)
Patient transported to X-ray 

## 2017-12-29 NOTE — ED Notes (Signed)
Pt verbalizes understanding of d/c instructions. Pt ambulatory at d/c with all belongings.   

## 2017-12-29 NOTE — ED Notes (Signed)
Pt ambulatory around nurse's station without difficulty. Pt denies any dizziness or lightheadedness. Pt reports feeling good.

## 2017-12-29 NOTE — ED Triage Notes (Signed)
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

## 2017-12-29 NOTE — ED Provider Notes (Signed)
[facility] EMERGENCY DEPARTMENT Provider Note   CSN: [id number] Arrival date & time: 12/29/17  1255     History   Chief Complaint Chief Complaint  Patient presents with  . Hypotension    HPI [name] is a 67 y.o. male.  HPI   Presents for evaluation of feeling hot, weak, and having trouble breathing while working outside.  He was found to have low blood pressure, treated with IV bolus by EMS, with improvement.  Blood sugar was elevated.  Patient saw his PCP, about 2 weeks ago for general checkup.  At that time it was noted that he had a cardiac murmur and a cardiac echo was ordered.  Patient has not had any syncope.  He denies chest pain.  He denies shortness of breath, fever or chills.  There are no other known modifying factors.  Past Medical History:  Diagnosis Date  . Abnormal myocardial perfusion study 2006   EF 44% ? inferoseptal ischemia. no cath done  . Breast mass, right 03/2008  . Contact dermatitis 01/31/2013  . Diabetes mellitus type II   . History of colonic polyps   . Hyperlipidemia   . Hypertension   . Lipoma   . Low back pain     Patient Active Problem List   Diagnosis Date Noted  . Preventative health care 03/02/2015  . ERECTILE DYSFUNCTION, ORGANIC 02/10/2009  . MYOCARDIAL PERFUSION SCAN, WITH STRESS TEST, ABNORMAL 08/11/2008  . Type 2 diabetes mellitus, uncontrolled, with neuropathy ([place]) 05/20/2008  . Hyperlipemia 10/21/2007  . HTN (hypertension) 10/21/2007  . HEMORRHOIDS, INTERNAL 03/18/2007  . DEGENERATIVE JOINT DISEASE, BACK 03/18/2007  . COLONIC POLYPS, HX OF 03/18/2007    Past Surgical History:  Procedure Laterality Date  . COLONOSCOPY W/ POLYPECTOMY    . LIPOMA EXCISION  07/2008    Dr Gershon Crane  . WISDOM TOOTH EXTRACTION          Home Medications    Prior to Admission medications   Medication Sig Start Date End Date Taking? Authorizing Provider  aspirin 81 MG tablet Take 81 mg by mouth daily.     Yes  [provider]  Cyanocobalamin (VITAMIN B-12 PO) Take 1 tablet by mouth daily.   Yes [provider]  dapagliflozin propanediol (FARXIGA) 5 MG TABS tablet Take 5 mg by mouth daily. Patient taking differently: Take 5 mg by mouth 2 (two) times a week.  08/11/17  Yes Debbrah Alar, NP  glipiZIDE (GLUCOTROL XL) 10 MG 24 hr tablet Take 1 tablet (10 mg total) by mouth daily with breakfast. 08/11/17  Yes Debbrah Alar, NP  glucose blood (PRODIGY NO CODING BLOOD GLUC) test strip Use as instructed to check blood sugar twice a day.  DX  E11.40 06/05/16  Yes Debbrah Alar, NP  insulin aspart (NOVOLOG FLEXPEN) 100 UNIT/ML FlexPen INJECT 11-15 SUBCUTANEOUSLY DAILY BEFORE SUPPER Patient taking differently: Inject 11 Units into the skin every evening.  12/24/17  Yes Debbrah Alar, NP  Insulin Pen Needle (B-D ULTRAFINE III SHORT PEN) 31G X 8 MM MISC USE AS DIRECTED 11/07/16  Yes Debbrah Alar, NP  LEVEMIR FLEXTOUCH 100 UNIT/ML Pen Inject 60 Units into the skin every morning. 12/08/17  Yes [provider]  lisinopril (PRINIVIL,ZESTRIL) 30 MG tablet Take 1 tablet (30 mg total) by mouth daily. 08/11/17  Yes Debbrah Alar, NP  metFORMIN (GLUCOPHAGE-XR) 500 MG 24 hr tablet Take 2 tablets (1,000 mg total) by mouth 2 (two) times daily. 08/11/17  Yes Debbrah Alar, NP  NIFEdipine (PROCARDIA-XL/ADALAT CC) 30 MG 24 hr tablet Take 3 tablets (90 mg total) by mouth daily. 08/11/17  Yes Debbrah Alar, NP  rosuvastatin (CRESTOR) 20 MG tablet Take 1 tablet (20 mg total) by mouth daily. 08/11/17  Yes Debbrah Alar, NP  tadalafil (CIALIS) 20 MG tablet One by mouth every 36 hours as needed 02/01/14  Yes Debbrah Alar, NP    Family History Family History  Problem Relation Age of Onset  . Melanoma Father 61       deceased secondary to melanoma  . Alzheimer's disease Father   . Diabetes Father   . Hypertension Mother        alive -53  . Colon  cancer Neg Hx     Social History Social History   Tobacco Use  . Smoking status: Former Research scientist (life sciences)  . Smokeless tobacco: Never Used  . Tobacco comment: 33 pack year history  Substance Use Topics  . Alcohol use: Yes    Alcohol/week: 1.8 oz    Types: 3 Glasses of wine per week  . Drug use: No     Allergies   Amlodipine besylate and Hydrochlorothiazide   Review of Systems Review of Systems  All other systems reviewed and are negative.    Physical Exam Updated Vital Signs BP 102/69   Pulse 81   Temp 97.7 F (36.5 C)   Resp (!) 22   SpO2 95%   Physical Exam  Constitutional: He is oriented to person, place, and time. He appears well-developed and well-nourished. No distress.  HENT:  Head: Normocephalic and atraumatic.  Right Ear: External ear normal.  Left Ear: External ear normal.  Eyes: Pupils are equal, round, and reactive to light. Conjunctivae and EOM are normal.  Neck: Normal range of motion and phonation normal. Neck supple.  Cardiovascular: Normal rate and regular rhythm.  3/6 systolic murmur left base of heart.  Pulmonary/Chest: Effort normal and breath sounds normal. He exhibits no bony tenderness.  Abdominal: Soft. There is no tenderness.  Musculoskeletal: Normal range of motion.  No dysarthria, aphasia or nystagmus.  Neurological: He is alert and oriented to person, place, and time. No cranial nerve deficit or sensory deficit. He exhibits normal muscle tone. Coordination normal.  Skin: Skin is warm, dry and intact.  Psychiatric: He has a normal mood and affect. His behavior is normal. Judgment and thought content normal.  Nursing note and vitals reviewed.    ED Treatments / Results  Labs (all labs ordered are listed, but only abnormal results are displayed) Labs Reviewed  COMPREHENSIVE METABOLIC PANEL - Abnormal; Notable for the following components:      Result Value   Glucose, Bld 244 (*)    Calcium 8.3 (*)    Total Protein 6.0 (*)    ALT 14 (*)     All other components within normal limits  CBC WITH DIFFERENTIAL/PLATELET - Abnormal; Notable for the following components:   Hemoglobin 12.9 (*)    MCV 74.9 (*)    MCH 23.3 (*)    RDW 15.6 (*)    Neutro Abs 7.8 (*)    All other components within normal limits  CK    EKG None  Radiology Dg Chest 2 View  Result Date: 12/29/2017 CLINICAL DATA:  67 year old male who felt overheated today working outside. Sudden onset shortness of breath. Pale and diaphoretic. EXAM: CHEST - 2 VIEW COMPARISON:  Chest radiographs 10/10/2011. FINDINGS: Upright AP and lateral views of the chest. Mediastinal contours are stable and within normal  limits. Visualized tracheal air column is within normal limits. Stable lung volumes. No pneumothorax or pleural effusion. Chronic but mildly increased bilateral pulmonary interstitial markings since 2013. Trace thickening of the right minor fissure. No pleural thickening or fluid identified otherwise. No confluent pulmonary opacity. No acute osseous abnormality identified. Negative visible bowel gas pattern. IMPRESSION: 1. Chronic but increased pulmonary interstitial opacity since 2013 is nonspecific. Consider mild or developing pulmonary edema, viral/atypical respiratory infection, or progressed chronic interstitial lung disease. 2. No other acute cardiopulmonary abnormality. Electronically Signed   By: Genevie Ann M.D.   On: 12/29/2017 15:50    Procedures Procedures (including critical care time)  Medications Ordered in ED Medications  sodium chloride 0.9 % bolus 1,000 mL (0 mLs Intravenous Stopped 12/29/17 1512)     Initial Impression / Assessment and Plan / ED Course  I have reviewed the triage vital signs and the nursing notes.  Pertinent labs & imaging results that were available during my care of the patient were reviewed by me and considered in my medical decision making (see chart for details).  Clinical Course as of Dec 30 1611  Mon Dec 29, 2017  1559 Normal    CK [EW]  1559 Normal except hemoglobin low 12.9, somewhat lower than baseline.  MCV also low at 75.  CBC with Differential(!) [EW]  1559 Normal except glucose high 244, calcium low 8.3, total protein low 6.0, ALT low 14.  Comprehensive metabolic panel(!) [EW]    Clinical Course User Index [EW] Daleen Bo, MD     Patient Vitals for the past 24 hrs:  BP Temp Pulse Resp SpO2  12/29/17 1530 - - 81 (!) 22 95 %  12/29/17 1515 102/69 - 83 19 98 %  12/29/17 1500 98/68 - 85 (!) 23 96 %  12/29/17 1445 102/71 - 82 17 96 %  12/29/17 1430 105/70 - 90 15 94 %  12/29/17 1415 101/65 - 75 (!) 24 96 %  12/29/17 1400 97/70 - 79 16 96 %  12/29/17 1345 93/71 - 79 (!) 22 93 %  12/29/17 1330 92/66 - 74 18 (!) 83 %  12/29/17 1304 (!) 92/52 97.7 F (36.5 C) 82 20 98 %    3:59 PM Reevaluation with update and discussion. After initial assessment and treatment, an updated evaluation reveals he is comfortable at this time.  As an additional historical finding he states that he has had sinus and chest congestion related to allergies recently.  Initial findings and plan discussed with patient and family member.[place] decision making-heat related weakness with low blood pressure, nonspecific.  Incidental hyperglycemia, he is being treated for diabetes, with insulin.  Mild anemia on testing.  Hyperglycemia without elevated anion gap is present.  Chest x-ray is nonspecific findings, with a broad differential.  Hemoglobin somewhat lower than 2016.  Near syncopal episode with low blood pressures consistent with volume depletion.  Chest x-ray indicating possible mild edema, is not consistent with heat related volume depletion near syncope.  On ambulation trial and orthostatic testing, blood pressure improved and he was able to ambulate.  4:25 PM-patient's wife has made a follow-up appointment with his PCP, for tomorrow.  Nursing Notes Reviewed/ Care Coordinated Applicable Imaging  Reviewed Interpretation of Laboratory Data incorporated into ED treatment  The patient appears reasonably screened and/or stabilized for discharge and I doubt any other medical condition or other [facility] requiring further screening, evaluation, or treatment in the ED at this time prior to discharge.  Plan: Home Medications-continue current medications; Home Treatments-rest, fluids, avoid heat; return here if the recommended treatment, does not improve the symptoms; Recommended follow up-PCP follow-up as planned, call sooner if needed for problems.      Final Clinical Impressions(s) / ED Diagnoses   Final diagnoses:  None    ED Discharge Orders    None       Daleen Bo, MD 12/29/17 1626

## 2017-12-29 NOTE — ED Notes (Signed)
Pt given po fluids and sandwhich , wife states it was cold, told wife we kept then in [place]

## 2017-12-30 ENCOUNTER — Encounter: Payer: Self-pay | Admitting: Family

## 2017-12-30 ENCOUNTER — Ambulatory Visit (INDEPENDENT_AMBULATORY_CARE_PROVIDER_SITE_OTHER): Payer: Medicare Other | Admitting: Family

## 2017-12-30 VITALS — BP 126/72 | HR 80 | Temp 98.5°F | Resp 16 | Ht 77.0 in | Wt 190.2 lb

## 2017-12-30 DIAGNOSIS — E11649 Type 2 diabetes mellitus with hypoglycemia without coma: Secondary | ICD-10-CM

## 2017-12-30 DIAGNOSIS — D649 Anemia, unspecified: Secondary | ICD-10-CM

## 2017-12-30 DIAGNOSIS — R400 Somnolence: Secondary | ICD-10-CM

## 2017-12-30 DIAGNOSIS — R011 Cardiac murmur, unspecified: Secondary | ICD-10-CM | POA: Diagnosis not present

## 2017-12-30 DIAGNOSIS — E1165 Type 2 diabetes mellitus with hyperglycemia: Secondary | ICD-10-CM

## 2017-12-30 DIAGNOSIS — R9389 Abnormal findings on diagnostic imaging of other specified body structures: Secondary | ICD-10-CM

## 2017-12-30 LAB — CBC WITH DIFFERENTIAL/PLATELET
BASOS ABS: 0 10*3/uL (ref 0.0–0.1)
BASOS PCT: 0.4 % (ref 0.0–3.0)
EOS ABS: 0.1 10*3/uL (ref 0.0–0.7)
Eosinophils Relative: 1.1 % (ref 0.0–5.0)
HEMATOCRIT: 42 % (ref 39.0–52.0)
HEMOGLOBIN: 13.2 g/dL (ref 13.0–17.0)
Lymphocytes Relative: 25.3 % (ref 12.0–46.0)
Lymphs Abs: 1.9 10*3/uL (ref 0.7–4.0)
MCHC: 31.4 g/dL (ref 30.0–36.0)
MCV: 73.9 fl — AB (ref 78.0–100.0)
MONOS PCT: 9.5 % (ref 3.0–12.0)
Monocytes Absolute: 0.7 10*3/uL (ref 0.1–1.0)
Neutro Abs: 4.7 10*3/uL (ref 1.4–7.7)
Neutrophils Relative %: 63.7 % (ref 43.0–77.0)
Platelets: 398 10*3/uL (ref 150.0–400.0)
RBC: 5.69 Mil/uL (ref 4.22–5.81)
RDW: 15.4 % (ref 11.5–15.5)
WBC: 7.3 10*3/uL (ref 4.0–10.5)

## 2017-12-30 LAB — COMPREHENSIVE METABOLIC PANEL
ALBUMIN: 3.8 g/dL (ref 3.5–5.2)
ALT: 13 U/L (ref 0–53)
AST: 17 U/L (ref 0–37)
Alkaline Phosphatase: 41 U/L (ref 39–117)
BILIRUBIN TOTAL: 0.4 mg/dL (ref 0.2–1.2)
BUN: 9 mg/dL (ref 6–23)
CHLORIDE: 109 meq/L (ref 96–112)
CO2: 27 meq/L (ref 19–32)
CREATININE: 0.88 mg/dL (ref 0.40–1.50)
Calcium: 8.9 mg/dL (ref 8.4–10.5)
GFR: 111.31 mL/min (ref >60.00)
Glucose, Bld: 100 mg/dL — ABNORMAL HIGH (ref 70–99)
Potassium: 4 mEq/L (ref 3.5–5.1)
Sodium: 143 mEq/L (ref 135–145)
Total Protein: 6.6 g/dL (ref 6.0–8.3)

## 2017-12-30 LAB — IRON: Iron: 61 ug/dL (ref 42–165)

## 2017-12-30 NOTE — Progress Notes (Signed)
Subjective:    Patient ID: [name], male    DOB: 16-Jan-1951, 67 y.o.   MRN: [id number]  HPI  [name] is a 67 yr old male who presents today for ER follow up. ER record is reviewed.  He developed weakness/sob while working outside yesterday. He was found to be hypotensive by EMS and was treated with IV bolus with improvement in his symptoms. He was also noted to have hyperglycemia with a sugar of 244.  He was mildly anemic with hgb of 12.9.  CXR noted:  Chronic but increased pulmonary interstitial opacity since 2013 is nonspecific. Consider mild or developing pulmonary edema, viral/atypical respiratory infection, or progressed chronic interstitial lung disease.   Lab Results  Component Value Date   HGBA1C 7.9 (H) 12/15/2017   Wife is concerned about irregular breathing at night.  + snoring.  Feels unrested often during the day.sometimes doses off.    He denies sob/dizziness/chest pain or swelling. Reports sugar this am was 75. Notes overall sugars have been running low since his insulin dose was increased.   Review of Systems See HPI    Past Medical History:  Diagnosis Date  . Abnormal myocardial perfusion study 2006   EF 44% ? inferoseptal ischemia. no cath done  . Breast mass, right 03/2008  . Contact dermatitis 01/31/2013  . Diabetes mellitus type II   . History of colonic polyps   . Hyperlipidemia   . Hypertension   . Lipoma   . Low back pain      Social History   Socioeconomic History  . Marital status: Married    Spouse name: Not on file  . Number of children: Not on file  . Years of education: Not on file  . Highest education level: Not on file  Occupational History  . Not on file  Social Needs  . Financial resource strain: Not on file  . Food insecurity:    Worry: Not on file    Inability: Not on file  . Transportation needs:    Medical: Not on file    Non-medical: Not on file  Tobacco Use  . Smoking status: Former Research scientist (life sciences)  . Smokeless  tobacco: Never Used  . Tobacco comment: 33 pack year history  Substance and Sexual Activity  . Alcohol use: Yes    Alcohol/week: 1.8 oz    Types: 3 Glasses of wine per week  . Drug use: No  . Sexual activity: Not on file  Lifestyle  . Physical activity:    Days per week: Not on file    Minutes per session: Not on file  . Stress: Not on file  Relationships  . Social connections:    Talks on phone: Not on file    Gets together: Not on file    Attends religious service: Not on file    Active member of club or organization: Not on file    Attends meetings of clubs or organizations: Not on file    Relationship status: Not on file  . Intimate partner violence:    Fear of current or ex partner: Not on file    Emotionally abused: Not on file    Physically abused: Not on file    Forced sexual activity: Not on file  Other Topics Concern  . 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 COLONOSCOPY W/ POLYPECTOMY    . LIPOMA EXCISION  07/2008    Dr Gershon Crane  . WISDOM TOOTH EXTRACTION      Family History  Problem Relation Age of Onset  . Melanoma Father 36       deceased secondary to melanoma  . Alzheimer's disease Father   . Diabetes Father   . Hypertension Mother        alive -43  . Colon cancer Neg Hx     Allergies  Allergen Reactions  . Amlodipine Besylate Other (See Comments)    REACTION: ? caused left axillary nodules/chest flutter  . Hydrochlorothiazide Hives    Current Outpatient Medications on File Prior to Visit  Medication Sig Dispense Refill  . aspirin 81 MG tablet Take 81 mg by mouth daily.      . Cyanocobalamin (VITAMIN B-12 PO) Take 1 tablet by mouth daily.    . dapagliflozin propanediol (FARXIGA) 5 MG TABS tablet Take 5 mg by mouth daily. (Patient taking differently: Take 5  mg by mouth 2 (two) times a week. ) 30 tablet 5  . glipiZIDE (GLUCOTROL XL) 10 MG 24 hr tablet Take 1 tablet (10 mg total) by mouth daily with breakfast. 90 tablet 1  . glucose blood (PRODIGY NO CODING BLOOD GLUC) test strip Use as instructed to check blood sugar twice a day.  DX  E11.40 100 each 1  . insulin aspart (NOVOLOG FLEXPEN) 100 UNIT/ML FlexPen INJECT 11-15 SUBCUTANEOUSLY DAILY BEFORE SUPPER (Patient taking differently: Inject 11 Units into the skin every evening. ) 15 mL 3  . Insulin Pen Needle (B-D ULTRAFINE III SHORT PEN) 31G X 8 MM MISC USE AS DIRECTED 100 each 5  . LEVEMIR FLEXTOUCH 100 UNIT/ML Pen Inject 60 Units into the skin every morning.    Marland Kitchen lisinopril (PRINIVIL,ZESTRIL) 30 MG tablet Take 1 tablet (30 mg total) by mouth daily. 90 tablet 1  . metFORMIN (GLUCOPHAGE-XR) 500 MG 24 hr tablet Take 2 tablets (1,000 mg total) by mouth 2 (two) times daily. 360 tablet 1  . NIFEdipine (PROCARDIA-XL/ADALAT CC) 30 MG 24 hr tablet Take 3 tablets (90 mg total) by mouth daily. 90 tablet 3  . rosuvastatin (CRESTOR) 20 MG tablet Take 1 tablet (20 mg total) by mouth daily. 30 tablet 5  . tadalafil (CIALIS) 20 MG tablet One by mouth every 36 hours as needed 6 tablet 11   Current Facility-Administered Medications on File Prior to Visit  Medication Dose Route Frequency Provider Last Rate Last Dose  . 0.9 %  sodium chloride infusion  500 mL Intravenous Continuous Danis, Estill Cotta III, MD        BP 126/72 (BP Location: Right Arm, Patient Position: Sitting, Cuff Size: Small)   Pulse 80   Temp 98.5 F (36.9 C) (Oral)   Resp 16   Ht 6\' 5"  (1.956 m)   Wt 190 lb 3.2 oz (86.3 kg)   HC 5" (12.7 cm)   SpO2 98%   BMI 22.55 kg/m    Objective:   Physical Exam  Constitutional: He is oriented to person, place, and time. He appears well-developed and well-nourished. No distress.  HENT:  Head: Normocephalic and atraumatic.  Cardiovascular: Normal rate and regular rhythm.  Murmur  heard. Pulmonary/Chest: Effort normal and breath sounds normal. No respiratory distress. He has no wheezes. He has no rales.  Musculoskeletal:  He exhibits no edema.  Neurological: He is alert and oriented to person, place, and time.  Skin: Skin is warm and dry.  Psychiatric: He has a normal mood and affect. His behavior is normal. Thought content normal.          Assessment & Plan:  DM2- has had some hypogylcemia recently. Will d/c glucotrol. Monitor.  Hypotension- suspect secondary to dehydration.  Resolved.  Monitor.   Abnormal chest xray- ? Interstitial lung disease. Will refer to pulmonology for further evaluation.  Daytime somnolence- refer for home sleep study.  Anemia- check serum iron, IFOB.   Murmur- await 2 D echo (scheduled).  Appears euvolemic.

## 2017-12-30 NOTE — Patient Instructions (Addendum)
Stop glucotrol.   Be sure to stay well hydrated. Complete stool cards and return at your earliest convenience. You will be contacted about your referral to pulmonology and home sleep study.

## 2018-01-06 ENCOUNTER — Other Ambulatory Visit (INDEPENDENT_AMBULATORY_CARE_PROVIDER_SITE_OTHER): Payer: Medicare Other

## 2018-01-06 DIAGNOSIS — D649 Anemia, unspecified: Secondary | ICD-10-CM | POA: Diagnosis not present

## 2018-01-06 LAB — FECAL OCCULT BLOOD, IMMUNOCHEMICAL: Fecal Occult Bld: NEGATIVE

## 2018-01-08 ENCOUNTER — Other Ambulatory Visit: Payer: Self-pay | Admitting: Family

## 2018-01-08 MED ORDER — LEVEMIR FLEXTOUCH 100 UNIT/ML ~~LOC~~ SOPN: 60.0000 [IU] | PEN_INJECTOR | SUBCUTANEOUS | 3 refills | Status: DC

## 2018-01-12 ENCOUNTER — Ambulatory Visit (INDEPENDENT_AMBULATORY_CARE_PROVIDER_SITE_OTHER): Payer: Medicare Other | Admitting: Cardiology

## 2018-01-12 ENCOUNTER — Ambulatory Visit (HOSPITAL_BASED_OUTPATIENT_CLINIC_OR_DEPARTMENT_OTHER)
Admission: RE | Admit: 2018-01-12 | Discharge: 2018-01-12 | Disposition: A | Discharge status: Home / Self Care | Payer: Medicare Other | Source: Ambulatory Visit | Attending: Family | Admitting: Family

## 2018-01-12 ENCOUNTER — Encounter: Payer: Self-pay | Admitting: Cardiology

## 2018-01-12 VITALS — BP 160/80 | HR 75 | Ht 76.0 in | Wt 188.0 lb

## 2018-01-12 DIAGNOSIS — R0609 Other forms of dyspnea: Secondary | ICD-10-CM | POA: Diagnosis not present

## 2018-01-12 DIAGNOSIS — E1140 Type 2 diabetes mellitus with diabetic neuropathy, unspecified: Secondary | ICD-10-CM

## 2018-01-12 DIAGNOSIS — I340 Nonrheumatic mitral (valve) insufficiency: Secondary | ICD-10-CM | POA: Diagnosis not present

## 2018-01-12 DIAGNOSIS — I350 Nonrheumatic aortic (valve) stenosis: Secondary | ICD-10-CM

## 2018-01-12 DIAGNOSIS — R011 Cardiac murmur, unspecified: Secondary | ICD-10-CM

## 2018-01-12 DIAGNOSIS — I10 Essential (primary) hypertension: Secondary | ICD-10-CM | POA: Diagnosis not present

## 2018-01-12 DIAGNOSIS — E1165 Type 2 diabetes mellitus with hyperglycemia: Secondary | ICD-10-CM

## 2018-01-12 DIAGNOSIS — IMO0002 Reserved for concepts with insufficient information to code with codable children: Secondary | ICD-10-CM

## 2018-01-12 DIAGNOSIS — R0600 Dyspnea, unspecified: Secondary | ICD-10-CM

## 2018-01-12 DIAGNOSIS — E785 Hyperlipidemia, unspecified: Secondary | ICD-10-CM | POA: Insufficient documentation

## 2018-01-12 DIAGNOSIS — E119 Type 2 diabetes mellitus without complications: Secondary | ICD-10-CM | POA: Insufficient documentation

## 2018-01-12 HISTORY — DX: Other forms of dyspnea: R06.09

## 2018-01-12 HISTORY — DX: Dyspnea, unspecified: R06.00

## 2018-01-12 HISTORY — DX: Nonrheumatic aortic (valve) stenosis: I35.0

## 2018-01-12 NOTE — Progress Notes (Signed)
  Echocardiogram 2D Echocardiogram has been performed.  Nealy Karapetian T Shelbey Spindler 01/12/2018, 8:57 AM

## 2018-01-12 NOTE — Patient Instructions (Signed)
Medication Instructions:  Your physician has recommended you make the following change in your medication:  STOP lisinopril STOP cialis STOP nifepine (procardia)  Labwork: None  Testing/Procedures:   [facility] [address], [place] [address] Dept: [phone] Loc: [place]  01/12/2018  You are scheduled for a Cardiac Catheterization on Tuesday, May 21 with Dr. Shelva Majestic.  1. Please arrive at the [facility] (Main Entrance A) at [facility]: [address] [place], [place] [address] at 5:30 AM (two hours before your procedure to ensure your preparation). Free valet parking service is available.   Special note: Every effort is made to have your procedure done on time. Please understand that emergencies sometimes delay scheduled procedures.  2. Diet: Do not eat or drink anything after midnight prior to your procedure except sips of water to take medications.  3. Labs: None needed.  4. Medication instructions in preparation for your procedure:   Current Outpatient Medications (Endocrine & Metabolic):  .  dapagliflozin propanediol (FARXIGA) 5 MG TABS tablet, Take 5 mg by mouth daily. (Patient taking differently: Take 5 mg by mouth 2 (two) times a week. ) .  insulin aspart (NOVOLOG FLEXPEN) 100 UNIT/ML FlexPen, INJECT 11-15 SUBCUTANEOUSLY DAILY BEFORE SUPPER (Patient taking differently: Inject 11 Units into the skin every evening. ) .  LEVEMIR FLEXTOUCH 100 UNIT/ML Pen, Inject 60 Units into the skin every morning. .  metFORMIN (GLUCOPHAGE-XR) 500 MG 24 hr tablet, Take 2 tablets (1,000 mg total) by mouth 2 (two) times daily.   Current Outpatient Medications (Cardiovascular):  .  lisinopril (PRINIVIL,ZESTRIL) 30 MG tablet, Take 1 tablet (30 mg total) by mouth daily. Marland Kitchen  NIFEdipine (PROCARDIA-XL/ADALAT CC) 30 MG 24 hr tablet, Take 3 tablets (90 mg  total) by mouth daily. .  rosuvastatin (CRESTOR) 20 MG tablet, Take 1 tablet (20 mg total) by mouth daily. .  tadalafil (CIALIS) 20 MG tablet, One by mouth every 36 hours as needed     Current Outpatient Medications (Analgesics):  .  aspirin 81 MG tablet, Take 81 mg by mouth daily.     Current Outpatient Medications (Hematological):  Marland Kitchen  Cyanocobalamin (VITAMIN B-12 PO), Take 1 tablet by mouth daily.   Current Outpatient Medications (Other):  .  glucose blood (PRODIGY NO CODING BLOOD GLUC) test strip, Use as instructed to check blood sugar twice a day.  DX  E11.40 .  Insulin Pen Needle (B-D ULTRAFINE III SHORT PEN) 31G X 8 MM MISC, USE AS DIRECTED  Current Facility-Administered Medications (Other):  .  0.9 %  sodium chloride infusion *For reference purposes while preparing patient instructions.   Delete this med list prior to printing instructions for patient.*   Stop taking, Glucophage (Metformin) on Tuesday, May 21.    Take only 30 units of insulin the night before your procedure. Do not take any insulin on the day of the procedure.  On the morning of your procedure, take your Aspirin and any morning medicines NOT listed above.  You may use sips of water.  5. Plan for one night stay--bring personal belongings. 6. Bring a current list of your medications and current insurance cards. 7. You MUST have a responsible person to drive you home. 8. Someone MUST be with you the first 24 hours after you arrive home or your discharge will be delayed. 9. Please wear clothes that are easy to get on and off and  wear slip-on shoes.  Thank you for allowing [place] to care for you!   --  Invasive Cardiovascular services   Follow-Up: Your physician recommends that you schedule a follow-up appointment in: 1 month.  Any Other Special Instructions Will Be Listed Below (If Applicable).     If you need a refill on your cardiac medications before your next appointment, please call  your pharmacy.

## 2018-01-12 NOTE — H&P (View-Only) (Signed)
Cardiology Consultation:    Date:  01/12/2018   ID:  [name], DOB 01-18-1951, MRN [id number]  PCP:  [name] Alar, NP  Cardiologist:  [name] Campus, MD   Referring MD: [name] Alar, NP   Chief Complaint  Patient presents with  . Follow up on ECHO  I just had echocardiogram done  History of Present Illness:    [name] is a 67 y.o. male who is being seen today for the evaluation of critical aortic stenosis at the request of [name] Alar, NP.  He came today for elective echocardiogram.  Upon he was identified to have heart murmur and echocardiogram has been scheduled.  Echo technician called me to laboratory reporting that his aortic valve appears to be critical.  Truly the area is less than 0.5 cm and mean gradient is more than 60.  This is preliminary report I will repeat echocardiogram later to confirm that.  Recently he ended up going to the emergency room because of shortness of breath.  Apparently he went to build it back.  He started digging a hole in the ground became severely short of breath to the point that his coworkers decided to call 911.  In the emergency room he was given some fluids and he was doing well after that.  Described to have exertional shortness of breath with some uneasy sensation in the chest and no passing out however he does have some dizziness. Apparently 2006 he did have a stress test done which was abnormal discussion about cardiac catheterization has been initiated at that time but nothing was done.  Past Medical History:  Diagnosis Date  . Abnormal myocardial perfusion study 2006   EF 44% ? inferoseptal ischemia. no cath done  . Breast mass, right 03/2008  . Contact dermatitis 01/31/2013  . Diabetes mellitus type II   . History of colonic polyps   . Hyperlipidemia   . Hypertension   . Lipoma   . Low back pain     Past Surgical History:  Procedure Laterality Date  . COLONOSCOPY W/ POLYPECTOMY    .  LIPOMA EXCISION  07/2008    Dr Gershon Crane  . WISDOM TOOTH EXTRACTION      Current Medications: Current Meds  Medication Sig  . aspirin 81 MG tablet Take 81 mg by mouth daily.    . Cyanocobalamin (VITAMIN B-12 PO) Take 1 tablet by mouth daily.  . dapagliflozin propanediol (FARXIGA) 5 MG TABS tablet Take 5 mg by mouth daily. (Patient taking differently: Take 5 mg by mouth 2 (two) times a week. )  . glucose blood (PRODIGY NO CODING BLOOD GLUC) test strip Use as instructed to check blood sugar twice a day.  DX  E11.40  . insulin aspart (NOVOLOG FLEXPEN) 100 UNIT/ML FlexPen INJECT 11-15 SUBCUTANEOUSLY DAILY BEFORE SUPPER (Patient taking differently: Inject 11 Units into the skin every evening. )  . Insulin Pen Needle (B-D ULTRAFINE III SHORT PEN) 31G X 8 MM MISC USE AS DIRECTED  . LEVEMIR FLEXTOUCH 100 UNIT/ML Pen Inject 60 Units into the skin every morning.  Marland Kitchen lisinopril (PRINIVIL,ZESTRIL) 30 MG tablet Take 1 tablet (30 mg total) by mouth daily.  . metFORMIN (GLUCOPHAGE-XR) 500 MG 24 hr tablet Take 2 tablets (1,000 mg total) by mouth 2 (two) times daily.  Marland Kitchen NIFEdipine (PROCARDIA-XL/ADALAT CC) 30 MG 24 hr tablet Take 3 tablets (90 mg total) by mouth daily.  . rosuvastatin (CRESTOR) 20 MG tablet Take 1 tablet (20 mg total) by mouth daily.  Marland Kitchen  tadalafil (CIALIS) 20 MG tablet One by mouth every 36 hours as needed   Current Facility-Administered Medications for the 01/12/18 encounter (Office Visit) with Park Liter, MD  Medication  . 0.9 %  sodium chloride infusion     Allergies:   Amlodipine besylate and Hydrochlorothiazide   Social History   Socioeconomic History  . Marital status: Married    Spouse name: Not on file  . Number of children: Not on file  . Years of education: Not on file  . Highest education level: Not on file  Occupational History  . Not on file  Social Needs  . Financial resource strain: Not on file  . Food insecurity:    Worry: Not on file    Inability: Not on  file  . Transportation needs:    Medical: Not on file    Non-medical: Not on file  Tobacco Use  . Smoking status: Former Research scientist (life sciences)  . Smokeless tobacco: Never Used  . Tobacco comment: 33 pack year history  Substance and Sexual Activity  . Alcohol use: Yes    Alcohol/week: 1.8 oz    Types: 3 Glasses of wine per week  . Drug use: No  . Sexual activity: Not on file  Lifestyle  . Physical activity:    Days per week: Not on file    Minutes per session: Not on file  . Stress: Not on file  Relationships  . Social connections:    Talks on phone: Not on file    Gets together: Not on file    Attends religious service: Not on file    Active member of club or organization: Not on file    Attends meetings of clubs or organizations: Not on file    Relationship status: Not on file  Other Topics Concern  . 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 There is no history of Colon cancer. ROS:   Please see the history of present illness.    All 14 point review of systems negative except as described per history of present illness.  EKGs/Labs/Other Studies Reviewed:    The following studies were reviewed today: Echocardiogram preliminary showing critical aortic stenosis with diminished left ventricular ejection fraction.  Echocardiogram will be read later today.  Last EKG done recently showed normal sinus rhythm normal P interval evidence of left ventricular hypertrophy  Recent Labs: 12/30/2017: ALT 13; BUN 9; Creatinine, Ser 0.88; Hemoglobin 13.2; Platelets 398.0; Potassium 4.0; Sodium 143  Recent Lipid Panel    Component Value Date/Time   CHOL 137 05/20/2017 1129   TRIG 60.0  05/20/2017 1129   HDL 42.10 05/20/2017 1129   CHOLHDL 3 05/20/2017 1129   VLDL 12.0 05/20/2017 1129   LDLCALC 83 05/20/2017 1129   LDLDIRECT 177.6 10/07/2007 1001    Physical Exam:    VS:  BP (!) 160/80   Pulse 75   Ht 6\' 4"  (1.93 m)   Wt 188 lb (85.3 kg)   SpO2 93%   BMI 22.88 kg/m     Wt Readings from Last 3 Encounters:  01/12/18 188  lb (85.3 kg)  12/30/17 190 lb 3.2 oz (86.3 kg)  12/15/17 189 lb 12.8 oz (86.1 kg)     GEN:  Well nourished, well developed in no acute distress HEENT: Normal NECK: No JVD; bilateral carotid bruit versus conducted murmur from aortic stenosis. LYMPHATICS: No lymphadenopathy CARDIAC: RRR, Solik ejection murmur grade 3/6 best heard at the right upper portion of the sternum with radiation towards the neck.  There is some musical component to it.  There is also a holosystolic murmur best heard at the apex., no rubs, no gallops RESPIRATORY:  Clear to auscultation without rales, wheezing or rhonchi  ABDOMEN: Soft, non-tender, non-distended MUSCULOSKELETAL:  No edema; No deformity  SKIN: Warm and dry NEUROLOGIC:  Alert and oriented x 3 PSYCHIATRIC:  Normal affect   ASSESSMENT:    1. Type 2 diabetes mellitus, uncontrolled, with neuropathy ([place])   2. Critical aortic valve stenosis   3. Dyspnea on exertion   4. Essential hypertension    PLAN:    In order of problems listed above:  1. Critical aortic stenosis.  Echocardiogram done today will be read later today.  So far what I have seen and is quite alarming his peak velocity is more than 5 m/s, aortic valve area less than 0.5 cm and mean gradient is more than 60.  With his symptomatology he clearly does have critical aortic stenosis.  That need to be addressed by surgical intervention.  I spoke in length to him about that in my opinion the best step will be to proceed with cardiac catheterization.  I think situation is quite urgent.  I would not wait more than a week.  I suggest for him to have a  cardiac catheterization tomorrow.  The purpose of cardiac catheterization is to check his coronary arteries.  If we will be able to cross the valve to verify it narrowing of the aortic stenosis however I think we have enough information already based on echocardiogram that we can proceed to surgery based on that.  We will stop his Procardia I will also stop his lisinopril.  Each recommendation will be made based on results of his cardiac catheterization. 2. Essential hypertension: He did not take his medications today.  We will discontinue his Procardia I will discontinue his lisinopril.  I told him also not to take Cialis. 3. Cardiomyopathy: His ejection fraction was mildly diminished by echocardiogram.  Again the key will be to fix his valve and check his coronary arteries first. 4. Diabetes mellitus: Apparently stable but his latest hemoglobin A1c done just 4 weeks ago revealed his hemoglobin A1c of 7.9 which is better than before but still not at target.  Both will require to be addressed in the future. 5. Bilateral carotid bruit.  Carotid ultrasound need to be done to check significance of stenosis or is there is just a conductive murmur from aortic stenosis.   Medication Adjustments/Labs and Tests Ordered: Current medicines are reviewed at length with the patient today.  Concerns regarding medicines are outlined above.  No orders of the defined types were placed in this encounter.  No orders of the defined types were placed in this encounter.   Signed, Park Liter, MD, [facility]. 01/12/2018 10:01 AM    [facility]

## 2018-01-12 NOTE — Progress Notes (Signed)
Cardiology Consultation:    Date:  01/12/2018   ID:  [name], DOB [birth year], MRN [id number]  PCP:  Debbrah Alar, NP  Cardiologist:  Jenne Campus, MD   Referring MD: Debbrah Alar, NP   Chief Complaint  Patient presents with  . Follow up on ECHO  I just had echocardiogram done  History of Present Illness:    [name] is a 67 y.o. male who is being seen today for the evaluation of critical aortic stenosis at the request of Debbrah Alar, NP.  He came today for elective echocardiogram.  Upon he was identified to have heart murmur and echocardiogram has been scheduled.  Echo technician called me to laboratory reporting that his aortic valve appears to be critical.  Truly the area is less than 0.5 cm and mean gradient is more than 60.  This is preliminary report I will repeat echocardiogram later to confirm that.  Recently he ended up going to the emergency room because of shortness of breath.  Apparently he went to build it back.  He started digging a hole in the ground became severely short of breath to the point that his coworkers decided to call 911.  In the emergency room he was given some fluids and he was doing well after that.  Described to have exertional shortness of breath with some uneasy sensation in the chest and no passing out however he does have some dizziness. Apparently 2006 he did have a stress test done which was abnormal discussion about cardiac catheterization has been initiated at that time but nothing was done.  Past Medical History:  Diagnosis Date  . Abnormal myocardial perfusion study 2006   EF 44% ? inferoseptal ischemia. no cath done  . Breast mass, right 03/2008  . Contact dermatitis 01/31/2013  . Diabetes mellitus type II   . History of colonic polyps   . Hyperlipidemia   . Hypertension   . Lipoma   . Low back pain     Past Surgical History:  Procedure Laterality Date  . COLONOSCOPY W/ POLYPECTOMY    .  LIPOMA EXCISION  07/2008    Dr Gershon Crane  . WISDOM TOOTH EXTRACTION      Current Medications: Current Meds  Medication Sig  . aspirin 81 MG tablet Take 81 mg by mouth daily.    . Cyanocobalamin (VITAMIN B-12 PO) Take 1 tablet by mouth daily.  . dapagliflozin propanediol (FARXIGA) 5 MG TABS tablet Take 5 mg by mouth daily. (Patient taking differently: Take 5 mg by mouth 2 (two) times a week. )  . glucose blood (PRODIGY NO CODING BLOOD GLUC) test strip Use as instructed to check blood sugar twice a day.  DX  E11.40  . insulin aspart (NOVOLOG FLEXPEN) 100 UNIT/ML FlexPen INJECT 11-15 SUBCUTANEOUSLY DAILY BEFORE SUPPER (Patient taking differently: Inject 11 Units into the skin every evening. )  . Insulin Pen Needle (B-D ULTRAFINE III SHORT PEN) 31G X 8 MM MISC USE AS DIRECTED  . LEVEMIR FLEXTOUCH 100 UNIT/ML Pen Inject 60 Units into the skin every morning.  Marland Kitchen lisinopril (PRINIVIL,ZESTRIL) 30 MG tablet Take 1 tablet (30 mg total) by mouth daily.  . metFORMIN (GLUCOPHAGE-XR) 500 MG 24 hr tablet Take 2 tablets (1,000 mg total) by mouth 2 (two) times daily.  Marland Kitchen NIFEdipine (PROCARDIA-XL/ADALAT CC) 30 MG 24 hr tablet Take 3 tablets (90 mg total) by mouth daily.  . rosuvastatin (CRESTOR) 20 MG tablet Take 1 tablet (20 mg total) by mouth daily.  Marland Kitchen  tadalafil (CIALIS) 20 MG tablet One by mouth every 36 hours as needed   Current Facility-Administered Medications for the 01/12/18 encounter (Office Visit) with Park Liter, MD  Medication  . 0.9 %  sodium chloride infusion     Allergies:   Amlodipine besylate and Hydrochlorothiazide   Social History   Socioeconomic History  . Marital status: Married    Spouse name: Not on file  . Number of children: Not on file  . Years of education: Not on file  . Highest education level: Not on file  Occupational History  . Not on file  Social Needs  . Financial resource strain: Not on file  . Food insecurity:    Worry: Not on file    Inability: Not on  file  . Transportation needs:    Medical: Not on file    Non-medical: Not on file  Tobacco Use  . Smoking status: Former Research scientist (life sciences)  . Smokeless tobacco: Never Used  . Tobacco comment: 33 pack year history  Substance and Sexual Activity  . Alcohol use: Yes    Alcohol/week: 1.8 oz    Types: 3 Glasses of wine per week  . Drug use: No  . Sexual activity: Not on file  Lifestyle  . Physical activity:    Days per week: Not on file    Minutes per session: Not on file  . Stress: Not on file  Relationships  . Social connections:    Talks on phone: Not on file    Gets together: Not on file    Attends religious service: Not on file    Active member of club or organization: Not on file    Attends meetings of clubs or organizations: Not on file    Relationship status: Not on file  Other Topics Concern  . 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 There is no history of Colon cancer. ROS:   Please see the history of present illness.    All 14 point review of systems negative except as described per history of present illness.  EKGs/Labs/Other Studies Reviewed:    The following studies were reviewed today: Echocardiogram preliminary showing critical aortic stenosis with diminished left ventricular ejection fraction.  Echocardiogram will be read later today.  Last EKG done recently showed normal sinus rhythm normal P interval evidence of left ventricular hypertrophy  Recent Labs: 12/30/2017: ALT 13; BUN 9; Creatinine, Ser 0.88; Hemoglobin 13.2; Platelets 398.0; Potassium 4.0; Sodium 143  Recent Lipid Panel    Component Value Date/Time   CHOL 137 05/20/2017 1129   TRIG 60.0  05/20/2017 1129   HDL 42.10 05/20/2017 1129   CHOLHDL 3 05/20/2017 1129   VLDL 12.0 05/20/2017 1129   LDLCALC 83 05/20/2017 1129   LDLDIRECT 177.6 10/07/2007 1001    Physical Exam:    VS:  BP (!) 160/80   Pulse 75   Ht 6\' 4"  (1.93 m)   Wt 188 lb (85.3 kg)   SpO2 93%   BMI 22.88 kg/m     Wt Readings from Last 3 Encounters:  01/12/18 188  lb (85.3 kg)  12/30/17 190 lb 3.2 oz (86.3 kg)  12/15/17 189 lb 12.8 oz (86.1 kg)     GEN:  Well nourished, well developed in no acute distress HEENT: Normal NECK: No JVD; bilateral carotid bruit versus conducted murmur from aortic stenosis. LYMPHATICS: No lymphadenopathy CARDIAC: RRR, Solik ejection murmur grade 3/6 best heard at the right upper portion of the sternum with radiation towards the neck.  There is some musical component to it.  There is also a holosystolic murmur best heard at the apex., no rubs, no gallops RESPIRATORY:  Clear to auscultation without rales, wheezing or rhonchi  ABDOMEN: Soft, non-tender, non-distended MUSCULOSKELETAL:  No edema; No deformity  SKIN: Warm and dry NEUROLOGIC:  Alert and oriented x 3 PSYCHIATRIC:  Normal affect   ASSESSMENT:    1. Type 2 diabetes mellitus, uncontrolled, with neuropathy ([place])   2. Critical aortic valve stenosis   3. Dyspnea on exertion   4. Essential hypertension    PLAN:    In order of problems listed above:  1. Critical aortic stenosis.  Echocardiogram done today will be read later today.  So far what I have seen and is quite alarming his peak velocity is more than 5 m/s, aortic valve area less than 0.5 cm and mean gradient is more than 60.  With his symptomatology he clearly does have critical aortic stenosis.  That need to be addressed by surgical intervention.  I spoke in length to him about that in my opinion the best step will be to proceed with cardiac catheterization.  I think situation is quite urgent.  I would not wait more than a week.  I suggest for him to have a  cardiac catheterization tomorrow.  The purpose of cardiac catheterization is to check his coronary arteries.  If we will be able to cross the valve to verify it narrowing of the aortic stenosis however I think we have enough information already based on echocardiogram that we can proceed to surgery based on that.  We will stop his Procardia I will also stop his lisinopril.  Each recommendation will be made based on results of his cardiac catheterization. 2. Essential hypertension: He did not take his medications today.  We will discontinue his Procardia I will discontinue his lisinopril.  I told him also not to take Cialis. 3. Cardiomyopathy: His ejection fraction was mildly diminished by echocardiogram.  Again the key will be to fix his valve and check his coronary arteries first. 4. Diabetes mellitus: Apparently stable but his latest hemoglobin A1c done just 4 weeks ago revealed his hemoglobin A1c of 7.9 which is better than before but still not at target.  Both will require to be addressed in the future. 5. Bilateral carotid bruit.  Carotid ultrasound need to be done to check significance of stenosis or is there is just a conductive murmur from aortic stenosis.   Medication Adjustments/Labs and Tests Ordered: Current medicines are reviewed at length with the patient today.  Concerns regarding medicines are outlined above.  No orders of the defined types were placed in this encounter.  No orders of the defined types were placed in this encounter.   Signed, Park Liter, MD, [facility]. 01/12/2018 10:01 AM    [facility]

## 2018-01-13 ENCOUNTER — Encounter (HOSPITAL_COMMUNITY)
Admission: RE | Disposition: A | Discharge status: Home / Self Care | Payer: Self-pay | Source: Ambulatory Visit | Attending: Cardiovascular Disease

## 2018-01-13 ENCOUNTER — Ambulatory Visit (HOSPITAL_COMMUNITY)
Admission: RE | Admit: 2018-01-13 | Discharge: 2018-01-13 | Disposition: A | Discharge status: Home / Self Care | Payer: Medicare Other | Source: Ambulatory Visit | Attending: Cardiovascular Disease | Admitting: Cardiovascular Disease

## 2018-01-13 DIAGNOSIS — I429 Cardiomyopathy, unspecified: Secondary | ICD-10-CM | POA: Insufficient documentation

## 2018-01-13 DIAGNOSIS — Z87891 Personal history of nicotine dependence: Secondary | ICD-10-CM | POA: Diagnosis not present

## 2018-01-13 DIAGNOSIS — Z794 Long term (current) use of insulin: Secondary | ICD-10-CM | POA: Insufficient documentation

## 2018-01-13 DIAGNOSIS — I350 Nonrheumatic aortic (valve) stenosis: Secondary | ICD-10-CM | POA: Diagnosis not present

## 2018-01-13 DIAGNOSIS — I352 Nonrheumatic aortic (valve) stenosis with insufficiency: Secondary | ICD-10-CM | POA: Diagnosis not present

## 2018-01-13 DIAGNOSIS — IMO0002 Reserved for concepts with insufficient information to code with codable children: Secondary | ICD-10-CM

## 2018-01-13 DIAGNOSIS — I10 Essential (primary) hypertension: Secondary | ICD-10-CM

## 2018-01-13 DIAGNOSIS — M545 Low back pain: Secondary | ICD-10-CM | POA: Diagnosis not present

## 2018-01-13 DIAGNOSIS — R0609 Other forms of dyspnea: Secondary | ICD-10-CM

## 2018-01-13 DIAGNOSIS — E1140 Type 2 diabetes mellitus with diabetic neuropathy, unspecified: Secondary | ICD-10-CM | POA: Diagnosis not present

## 2018-01-13 DIAGNOSIS — E1165 Type 2 diabetes mellitus with hyperglycemia: Secondary | ICD-10-CM

## 2018-01-13 DIAGNOSIS — Z7982 Long term (current) use of aspirin: Secondary | ICD-10-CM | POA: Diagnosis not present

## 2018-01-13 DIAGNOSIS — I2510 Atherosclerotic heart disease of native coronary artery without angina pectoris: Secondary | ICD-10-CM | POA: Insufficient documentation

## 2018-01-13 DIAGNOSIS — E785 Hyperlipidemia, unspecified: Secondary | ICD-10-CM | POA: Diagnosis not present

## 2018-01-13 HISTORY — PX: RIGHT/LEFT HEART CATH AND CORONARY ANGIOGRAPHY: CATH118266

## 2018-01-13 HISTORY — PX: ABDOMINAL AORTOGRAM: CATH118222

## 2018-01-13 LAB — POCT I-STAT 3, ART BLOOD GAS (G3+)
ACID-BASE DEFICIT: 2 mmol/L (ref 0.0–2.0)
Bicarbonate: 24.5 mmol/L (ref 20.0–28.0)
O2 SAT: 98 %
PCO2 ART: 47.3 mmHg (ref 32.0–48.0)
PO2 ART: 110 mmHg — AB (ref 83.0–108.0)
TCO2: 26 mmol/L (ref 22–32)
pH, Arterial: 7.321 — ABNORMAL LOW (ref 7.350–7.450)

## 2018-01-13 LAB — GLUCOSE, CAPILLARY
GLUCOSE-CAPILLARY: 179 mg/dL — AB (ref 65–99)
Glucose-Capillary: 142 mg/dL — ABNORMAL HIGH (ref 65–99)

## 2018-01-13 LAB — POCT I-STAT 3, VENOUS BLOOD GAS (G3P V)
Acid-base deficit: 2 mmol/L (ref 0.0–2.0)
BICARBONATE: 25.8 mmol/L (ref 20.0–28.0)
O2 Saturation: 70 %
PCO2 VEN: 52.5 mmHg (ref 44.0–60.0)
PH VEN: 7.299 (ref 7.250–7.430)
TCO2: 27 mmol/L (ref 22–32)
pO2, Ven: 41 mmHg (ref 32.0–45.0)

## 2018-01-13 SURGERY — RIGHT/LEFT HEART CATH AND CORONARY ANGIOGRAPHY
Anesthesia: LOCAL

## 2018-01-13 MED ORDER — ONDANSETRON HCL 4 MG/2ML IJ SOLN: 4.0000 mg | Freq: Four times a day (QID) | INTRAMUSCULAR | Status: DC | PRN

## 2018-01-13 MED ORDER — HEPARIN (PORCINE) IN NACL 1000-0.9 UT/500ML-% IV SOLN
INTRAVENOUS | Status: AC
  Filled 2018-01-13: qty 1000

## 2018-01-13 MED ORDER — LIDOCAINE HCL (PF) 1 % IJ SOLN
INTRAMUSCULAR | Status: DC | PRN
  Administered 2018-01-13: 15 mL
  Administered 2018-01-13: 20 mL

## 2018-01-13 MED ORDER — SODIUM CHLORIDE 0.9% FLUSH: 3.0000 mL | Freq: Two times a day (BID) | INTRAVENOUS | Status: DC

## 2018-01-13 MED ORDER — HEPARIN (PORCINE) IN NACL 2-0.9 UNITS/ML
INTRAMUSCULAR | Status: AC | PRN
  Administered 2018-01-13 (×2): 500 mL

## 2018-01-13 MED ORDER — SODIUM CHLORIDE 0.9% FLUSH: 3.0000 mL | INTRAVENOUS | Status: DC | PRN

## 2018-01-13 MED ORDER — MIDAZOLAM HCL 2 MG/2ML IJ SOLN
INTRAMUSCULAR | Status: AC
  Filled 2018-01-13: qty 2

## 2018-01-13 MED ORDER — IOHEXOL 350 MG/ML SOLN
INTRAVENOUS | Status: DC | PRN
  Administered 2018-01-13: 95 mL

## 2018-01-13 MED ORDER — SODIUM CHLORIDE 0.9 % WEIGHT BASED INFUSION: 1.0000 mL/kg/h | INTRAVENOUS | Status: DC

## 2018-01-13 MED ORDER — MIDAZOLAM HCL 2 MG/2ML IJ SOLN
INTRAMUSCULAR | Status: DC | PRN
  Administered 2018-01-13: 2 mg via INTRAVENOUS
  Administered 2018-01-13: 1 mg via INTRAVENOUS

## 2018-01-13 MED ORDER — LIDOCAINE HCL (PF) 1 % IJ SOLN
INTRAMUSCULAR | Status: AC
  Filled 2018-01-13: qty 30

## 2018-01-13 MED ORDER — ASPIRIN 81 MG PO CHEW: 81.0000 mg | CHEWABLE_TABLET | ORAL | Status: DC

## 2018-01-13 MED ORDER — SODIUM CHLORIDE 0.9 % IV SOLN: INTRAVENOUS | Status: DC

## 2018-01-13 MED ORDER — FENTANYL CITRATE (PF) 100 MCG/2ML IJ SOLN
INTRAMUSCULAR | Status: DC | PRN
  Administered 2018-01-13 (×2): 25 ug via INTRAVENOUS

## 2018-01-13 MED ORDER — SODIUM CHLORIDE 0.9 % WEIGHT BASED INFUSION
3.0000 mL/kg/h | INTRAVENOUS | Status: AC
  Administered 2018-01-13: 3 mL/kg/h via INTRAVENOUS

## 2018-01-13 MED ORDER — FENTANYL CITRATE (PF) 100 MCG/2ML IJ SOLN
INTRAMUSCULAR | Status: AC
  Filled 2018-01-13: qty 2

## 2018-01-13 MED ORDER — SODIUM CHLORIDE 0.9 % IV SOLN: 250.0000 mL | INTRAVENOUS | Status: DC | PRN

## 2018-01-13 SURGICAL SUPPLY — 17 items
CATH INFINITI 5FR JL5 (CATHETERS) ×1 IMPLANT
CATH INFINITI 5FR MULTPACK ANG (CATHETERS) ×1 IMPLANT
CATH LANGSTON DUAL LUM PIG 6FR (CATHETERS) ×1 IMPLANT
CATH SWAN GANZ 7F STRAIGHT (CATHETERS) ×1 IMPLANT
KIT HEART LEFT (KITS) ×2 IMPLANT
PACK CARDIAC CATHETERIZATION (CUSTOM PROCEDURE TRAY) ×2 IMPLANT
SHEATH PINNACLE 5F 10CM (SHEATH) ×1 IMPLANT
SHEATH PINNACLE 6F 10CM (SHEATH) ×1 IMPLANT
SHEATH PINNACLE 7F 10CM (SHEATH) ×1 IMPLANT
SYR MEDRAD MARK V 150ML (SYRINGE) ×2 IMPLANT
TRANSDUCER W/STOPCOCK (MISCELLANEOUS) ×2 IMPLANT
TUBING CIL FLEX 10 FLL-RA (TUBING) ×2 IMPLANT
WIRE EMERALD 3MM-J .025X260CM (WIRE) ×1 IMPLANT
WIRE EMERALD 3MM-J .035X150CM (WIRE) ×2 IMPLANT
WIRE EMERALD 3MM-J .035X260CM (WIRE) ×1 IMPLANT
WIRE EMERALD ST .035X260CM (WIRE) ×1 IMPLANT
WIRE HI TORQ VERSACORE-J 145CM (WIRE) ×2 IMPLANT

## 2018-01-13 NOTE — Interval H&P Note (Signed)
History and Physical Interval Note:  01/13/2018 7:39 AM  [name]  has presented today for surgery, with the diagnosis of arotic stenosis  The various methods of treatment have been discussed with the patient and family. After consideration of risks, benefits and other options for treatment, the patient has consented to  Procedure(s): RIGHT/LEFT HEART CATH AND CORONARY ANGIOGRAPHY (N/A) as a surgical intervention .  The patient's history has been reviewed, patient examined, no change in status, stable for surgery.  I have reviewed the patient's chart and labs.  Questions were answered to the patient's satisfaction.     Shelva Majestic

## 2018-01-13 NOTE — Interval H&P Note (Signed)
History and Physical Interval Note:  01/13/2018 7:38 AM  [name]  has presented today for surgery, with the diagnosis of arotic stenosis  The various methods of treatment have been discussed with the patient and family. After consideration of risks, benefits and other options for treatment, the patient has consented to  Procedure(s): RIGHT/LEFT HEART CATH AND CORONARY ANGIOGRAPHY (N/A) as a surgical intervention .  The patient's history has been reviewed, patient examined, no change in status, stable for surgery.  I have reviewed the patient's chart and labs.  Questions were answered to the patient's satisfaction.     Shelva Majestic

## 2018-01-13 NOTE — Discharge Instructions (Signed)

## 2018-01-13 NOTE — Progress Notes (Signed)
Site area: Left groin a 5 and 7 french arterial sheath was removed  Site Prior to Removal:  Level 0  Pressure Applied For 20 MINUTES    Bedrest Beginning at 1000am  Manual:   Yes.    Patient Status During Pull:  stable  Post Pull Groin Site:  Level 0  Post Pull Instructions Given:  Yes.    Post Pull Pulses Present:  Yes.    Dressing Applied:  Yes.    Comments:  VS remain stable.

## 2018-01-13 NOTE — Progress Notes (Signed)
Right groin scant amount of blood on right dressing. Dressing was removed, no further bleeding noted, new dressing applied, will continue to monitor.

## 2018-01-14 ENCOUNTER — Encounter (HOSPITAL_COMMUNITY): Payer: Self-pay | Admitting: Cardiovascular Disease

## 2018-01-14 ENCOUNTER — Other Ambulatory Visit: Payer: Self-pay | Admitting: Family

## 2018-01-14 MED FILL — Heparin Sod (Porcine)-NaCl IV Soln 1000 Unit/500ML-0.9%: INTRAVENOUS | Qty: 1000 | Status: AC

## 2018-01-16 ENCOUNTER — Other Ambulatory Visit: Payer: Self-pay | Admitting: Family

## 2018-02-02 ENCOUNTER — Ambulatory Visit (INDEPENDENT_AMBULATORY_CARE_PROVIDER_SITE_OTHER): Payer: Medicare Other | Admitting: Pulmonary Disease

## 2018-02-02 ENCOUNTER — Encounter: Payer: Self-pay | Admitting: Pulmonary Disease

## 2018-02-02 VITALS — BP 136/80 | HR 91 | Ht 75.0 in | Wt 186.0 lb

## 2018-02-02 DIAGNOSIS — R0602 Shortness of breath: Secondary | ICD-10-CM

## 2018-02-02 DIAGNOSIS — R9389 Abnormal findings on diagnostic imaging of other specified body structures: Secondary | ICD-10-CM

## 2018-02-02 NOTE — Patient Instructions (Signed)
Abnormal chest x-ray: We will get a high-resolution CT scan of the chest to make sure there is no evidence of scarring or inflammation in your lungs  Shortness of breath: Given your smoking history and the abnormal chest x-ray we will check a test called a pulmonary function test. However, I think the most likely explanation for the shortness of breath you experienced in the last year is your heart disease. I recommend you follow-up with cardiology and consider heart surgery if they suggest that that is the best next step. In the meantime I would not exercise heavily until you have spoken to the cardiologist.  We will see you back if the lung function test and CT scan show any sort of underlying lung disease

## 2018-02-02 NOTE — Progress Notes (Signed)
Synopsis: Referred in June 2019 for some congestion in his chest;   Subjective:   PATIENT ID: [name] GENDER: male DOB: 1951-08-04, MRN: [id number]   HPI  Chief Complaint  Patient presents with  . pulmonary consult    referred by Dr. Inda Castle for fluid on lungs. complains of some congestion.    He was recently found to have severe aortic stenosis and was sent here for me to evaluate after having some dyspnea. > he says that prior to the recent angiogram/work up he was having more dyspea, that has improve > he has been having gradual increase in dyspnea > he feels better now > he was coughing prior to the angiogram  He has noticed a cough over the years > Sometimes he will cough up phlegm  He quit smoking cigarettes around 2012 > prior to that he smoked > 1 pack a day for 41 years  He worked in Architect over the years.  He knows he worked around [employer] from time to time and he had to wear a mask a few times when he had to clear out ceiling tiles.  Early on his career he didn't wear a mask.  He doesn't get pneumonia or bronchitis.  He says that prior to a few years ago he had that problem, but since he started getting the flu shot he has been doing pretty well.    Past Medical History:  Diagnosis Date  . Abnormal myocardial perfusion study 2006   EF 44% ? inferoseptal ischemia. no cath done  . Breast mass, right 03/2008  . Contact dermatitis 01/31/2013  . Diabetes mellitus type II   . History of colonic polyps   . Hyperlipidemia   . Hypertension   . Lipoma   . Low back pain      Family History  Problem Relation Age of Onset  . Melanoma Father 65       deceased secondary to melanoma  . Alzheimer's disease Father   . Diabetes Father   . Hypertension Mother        alive -67  . Colon cancer Neg Hx      Social History   Socioeconomic History  . Marital status: Married    Spouse name: Not on file  . Number of children: Not on file  . Years  of education: Not on file  . Highest education level: Not on file  Occupational History  . Not on file  Social Needs  . Financial resource strain: Not on file  . Food insecurity:    Worry: Not on file    Inability: Not on file  . Transportation needs:    Medical: Not on file    Non-medical: Not on file  Tobacco Use  . Smoking status: Former Research scientist (life sciences)  . Smokeless tobacco: Never Used  . Tobacco comment: 33 pack year history  Substance and Sexual Activity  . Alcohol use: Yes    Alcohol/week: 1.8 oz    Types: 3 Glasses of wine per week  . Drug use: No  . Sexual activity: Not on file  Lifestyle  . Physical activity:    Days per week: Not on file    Minutes per session: Not on file  . Stress: Not on file  Relationships  . Social connections:    Talks on phone: Not on file    Gets together: Not on file    Attends religious service: Not on file    Active member of  club or organization: Not on file    Attends meetings of clubs or organizations: Not on file    Relationship status: Not on file  . Intimate partner violence:    Fear of current or ex partner: Not on file    Emotionally abused: Not on file    Physically abused: Not on file    Forced sexual activity: Not on file  Other Topics Concern  . 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 Amlodipine Besylate Other (See Comments)    REACTION: ? caused left axillary nodules/chest flutter  . Hydrochlorothiazide Hives     Outpatient Medications Prior to Visit  Medication Sig Dispense Refill  . aspirin 81 MG tablet Take 81 mg by mouth daily.      . Cyanocobalamin (VITAMIN B-12 PO) Take 1 tablet by mouth daily.    . dapagliflozin propanediol (FARXIGA) 5 MG TABS tablet Take 5 mg by mouth daily. (Patient taking differently: Take 5 mg by mouth 2 (two) times a  week. ) 30 tablet 5  . glucose blood (PRODIGY NO CODING BLOOD GLUC) test strip Use as instructed to check blood sugar twice a day.  DX  E11.40 100 each 1  . insulin aspart (NOVOLOG FLEXPEN) 100 UNIT/ML FlexPen INJECT 11-15 SUBCUTANEOUSLY DAILY BEFORE SUPPER (Patient taking differently: Inject 11 Units into the skin every evening. ) 15 mL 3  . Insulin Detemir (LEVEMIR FLEXTOUCH) 100 UNIT/ML Pen Inject 60 Units into the skin daily at 10 pm. 15 mL 3  . Insulin Pen Needle (B-D ULTRAFINE III SHORT PEN) 31G X 8 MM MISC USE AS DIRECTED 100 each 5  . LEVEMIR FLEXTOUCH 100 UNIT/ML Pen Inject 60 Units into the skin every morning. 15 mL 3  . metFORMIN (GLUCOPHAGE-XR) 500 MG 24 hr tablet Take 2 tablets (1,000 mg total) by mouth 2 (two) times daily. 360 tablet 1  . NIFEdipine (PROCARDIA-XL/ADALAT CC) 30 MG 24 hr tablet TAKE 3 TABLETS (90 MG TOTAL) BY MOUTH DAILY. 90 tablet 5  . rosuvastatin (CRESTOR) 20 MG tablet Take 1 tablet (20 mg total) by mouth daily. 30 tablet 5   Facility-Administered Medications Prior to Visit  Medication Dose Route Frequency Provider Last Rate Last Dose  . 0.9 %  sodium chloride infusion  500 mL Intravenous Continuous [name], MD        Review of Systems  Constitutional: Negative for chills, fever, malaise/fatigue and weight loss.  HENT: Negative for congestion, nosebleeds, sinus pain and sore throat.   Eyes: Negative for photophobia, pain and discharge.  Respiratory: Positive for cough, sputum production and shortness of breath. Negative for hemoptysis and wheezing.   Cardiovascular: Negative for chest pain, palpitations, orthopnea and leg swelling.  Gastrointestinal: Negative for abdominal pain, constipation, diarrhea, nausea and vomiting.  Genitourinary: Negative for dysuria, frequency, hematuria and urgency.  Musculoskeletal: Negative for back pain, joint pain, myalgias and neck pain.  Skin: Negative for itching and rash.  Neurological: Negative for tingling,  tremors, sensory change, speech change, focal weakness, seizures, weakness and headaches.  Psychiatric/Behavioral: Negative for memory loss, substance abuse and suicidal ideas. The patient is not nervous/anxious.       Objective:  Physical Exam   Vitals:  02/02/18 1417  BP: 136/80  Pulse: 91  SpO2: 95%  Weight: 186 lb (84.4 kg)  Height: 6\' 3"  (1.905 m)    RA  Gen: well appearing, no acute distress HENT: NCAT, OP clear, neck supple without masses Eyes: PERRL, EOMi Lymph: no cervical lymphadenopathy PULM: CTA B CV: RRR, systolic murmur, no JVD GI: BS+, soft, nontender, no hsm Derm: no rash or skin breakdown MSK: normal bulk and tone Neuro: A&Ox4, CN II-XII intact, strength 5/5 in all 4 extremities Psyche: normal mood and affect   CBC    Component Value Date/Time   WBC 7.3 12/30/2017 0858   RBC 5.69 12/30/2017 0858   HGB 13.2 12/30/2017 0858   HCT 42.0 12/30/2017 0858   PLT 398.0 12/30/2017 0858   MCV 73.9 (L) 12/30/2017 0858   MCH 23.3 (L) 12/29/2017 1413   MCHC 31.4 12/30/2017 0858   RDW 15.4 12/30/2017 0858   LYMPHSABS 1.9 12/30/2017 0858   MONOABS 0.7 12/30/2017 0858   EOSABS 0.1 12/30/2017 0858   BASOSABS 0.0 12/30/2017 0858     Chest imaging: 12/2017 CXR > increased interstitial markings  PFT:  Labs:  Path:  Echo: 11/2017 TTE> Critical AS, LVEF 35-40%, mod AR, RVSP 41 mmHg  Heart Catheterization: 12/2017 LHC and RHC showed severe AS, non-obstructive multi-vessel CAD, PA mean 30      Assessment & Plan:   Abnormal CXR  Shortness of breath - Plan: Pulmonary Function Test, CT CHEST HIGH RESOLUTION  Discussion: -Mr. Kamerin is here for evaluation of an abnormal chest x-ray in the setting of impending cardiac surgery.  He has critical aortic stenosis and coronary artery disease with a decreased LVEF and mild pulmonary hypertension.  I suspect the next step in his management will be surgical correction.  From a respiratory standpoint he has  shockingly few symptoms.  I am not sure what to make of the abnormal chest x-ray from May 2019.  It does show increased interstitial markings which given his occupational and smoking history could be due to a broad array of causes.  However, I think it is possible that he came in volume overloaded and that likely represented increased interstitial edema which is now resolved.  Today his physical exam is within normal limits with the exception of the surprisingly mild heart murmur.  Plan: Abnormal chest x-ray: We will get a high-resolution CT scan of the chest to make sure there is no evidence of scarring or inflammation in your lungs  Shortness of breath: Given your smoking history and the abnormal chest x-ray we will check a test called a pulmonary function test. However, I think the most likely explanation for the shortness of breath you experienced in the last year is your heart disease. I recommend you follow-up with cardiology and consider heart surgery if they suggest that that is the best next step. In the meantime I would not exercise heavily until you have spoken to the cardiologist.  We will see you back if the lung function test and CT scan show any sort of underlying lung disease    Current Outpatient Medications:  .  aspirin 81 MG tablet, Take 81 mg by mouth daily.  , Disp: , Rfl:  .  Cyanocobalamin (VITAMIN B-12 PO), Take 1 tablet by mouth daily., Disp: , Rfl:  .  dapagliflozin propanediol (FARXIGA) 5 MG TABS tablet, Take 5 mg by mouth daily. (Patient taking differently: Take 5 mg by mouth 2 (two) times a week. ), Disp: 30 tablet, Rfl: 5 .  glucose blood (PRODIGY NO CODING BLOOD GLUC) test strip, Use as instructed to check blood sugar twice a day.  DX  E11.40, Disp: 100 each, Rfl: 1 .  insulin aspart (NOVOLOG FLEXPEN) 100 UNIT/ML FlexPen, INJECT 11-15 SUBCUTANEOUSLY DAILY BEFORE SUPPER (Patient taking differently: Inject 11 Units into the skin every evening. ), Disp: 15 mL, Rfl:  3 .  Insulin Detemir (LEVEMIR FLEXTOUCH) 100 UNIT/ML Pen, Inject 60 Units into the skin daily at 10 pm., Disp: 15 mL, Rfl: 3 .  Insulin Pen Needle (B-D ULTRAFINE III SHORT PEN) 31G X 8 MM MISC, USE AS DIRECTED, Disp: 100 each, Rfl: 5 .  LEVEMIR FLEXTOUCH 100 UNIT/ML Pen, Inject 60 Units into the skin every morning., Disp: 15 mL, Rfl: 3 .  metFORMIN (GLUCOPHAGE-XR) 500 MG 24 hr tablet, Take 2 tablets (1,000 mg total) by mouth 2 (two) times daily., Disp: 360 tablet, Rfl: 1 .  NIFEdipine (PROCARDIA-XL/ADALAT CC) 30 MG 24 hr tablet, TAKE 3 TABLETS (90 MG TOTAL) BY MOUTH DAILY., Disp: 90 tablet, Rfl: 5 .  rosuvastatin (CRESTOR) 20 MG tablet, Take 1 tablet (20 mg total) by mouth daily., Disp: 30 tablet, Rfl: 5  Current Facility-Administered Medications:  .  0.9 %  sodium chloride infusion, 500 mL, Intravenous, Continuous, Danis, Kirke Corin, MD

## 2018-02-03 ENCOUNTER — Other Ambulatory Visit: Payer: Self-pay | Admitting: Family

## 2018-02-03 DIAGNOSIS — G4733 Obstructive sleep apnea (adult) (pediatric): Secondary | ICD-10-CM | POA: Diagnosis not present

## 2018-02-03 DIAGNOSIS — R400 Somnolence: Secondary | ICD-10-CM

## 2018-02-03 NOTE — Progress Notes (Signed)
HST order placed again so that test order may be release in Epic for result scanning Parke Poisson, [facility] 02/03/18

## 2018-02-04 ENCOUNTER — Telehealth: Payer: Self-pay | Admitting: Family

## 2018-02-04 ENCOUNTER — Ambulatory Visit (INDEPENDENT_AMBULATORY_CARE_PROVIDER_SITE_OTHER): Payer: Medicare Other | Admitting: Pulmonary Disease

## 2018-02-04 ENCOUNTER — Ambulatory Visit (INDEPENDENT_AMBULATORY_CARE_PROVIDER_SITE_OTHER)
Admission: RE | Admit: 2018-02-04 | Discharge: 2018-02-04 | Disposition: A | Discharge status: Home / Self Care | Payer: Medicare Other | Source: Ambulatory Visit | Attending: Pulmonary Disease | Admitting: Pulmonary Disease

## 2018-02-04 DIAGNOSIS — G4733 Obstructive sleep apnea (adult) (pediatric): Secondary | ICD-10-CM | POA: Diagnosis not present

## 2018-02-04 DIAGNOSIS — R0602 Shortness of breath: Secondary | ICD-10-CM | POA: Diagnosis not present

## 2018-02-04 DIAGNOSIS — J439 Emphysema, unspecified: Secondary | ICD-10-CM | POA: Diagnosis not present

## 2018-02-04 LAB — PULMONARY FUNCTION TEST
DL/VA % PRED: 57 %
DL/VA: 2.79 ml/min/mmHg/L
DLCO COR % PRED: 37 %
DLCO COR: 14.75 ml/min/mmHg
DLCO UNC % PRED: 36 %
DLCO unc: 14.14 ml/min/mmHg
FEF 25-75 POST: 0.96 L/s
FEF 25-75 PRE: 0.7 L/s
FEF2575-%CHANGE-POST: 37 %
FEF2575-%Pred-Post: 30 %
FEF2575-%Pred-Pre: 22 %
FEV1-%Change-Post: 9 %
FEV1-%PRED-PRE: 49 %
FEV1-%Pred-Post: 54 %
FEV1-POST: 1.96 L
FEV1-Pre: 1.78 L
FEV1FVC-%CHANGE-POST: -4 %
FEV1FVC-%Pred-Pre: 68 %
FEV6-%CHANGE-POST: 14 %
FEV6-%PRED-PRE: 72 %
FEV6-%Pred-Post: 82 %
FEV6-Post: 3.71 L
FEV6-Pre: 3.25 L
FEV6FVC-%Change-Post: -1 %
FEV6FVC-%Pred-Post: 99 %
FEV6FVC-%Pred-Pre: 101 %
FVC-%Change-Post: 15 %
FVC-%PRED-PRE: 71 %
FVC-%Pred-Post: 82 %
FVC-POST: 3.88 L
FVC-PRE: 3.37 L
POST FEV6/FVC RATIO: 96 %
PRE FEV1/FVC RATIO: 53 %
Post FEV1/FVC ratio: 50 %
Pre FEV6/FVC Ratio: 97 %
RV % pred: 125 %
RV: 3.34 L
TLC % PRED: 91 %
TLC: 7.36 L

## 2018-02-04 NOTE — Progress Notes (Signed)
PFT done today. 

## 2018-02-04 NOTE — Telephone Encounter (Signed)
Please contact pt and let him know that his oxygen dropped while sleeping. I would like him to have an overnight study in the sleep lab to determine settings for CPAP and possible oxygen overnight.

## 2018-02-09 NOTE — Telephone Encounter (Signed)
Notified pt and he voices understanding and is agreeable to proceed with CPAP titration.

## 2018-02-12 ENCOUNTER — Encounter: Payer: Self-pay | Admitting: Cardiology

## 2018-02-12 ENCOUNTER — Ambulatory Visit (HOSPITAL_BASED_OUTPATIENT_CLINIC_OR_DEPARTMENT_OTHER)
Admission: RE | Admit: 2018-02-12 | Discharge: 2018-02-12 | Disposition: A | Discharge status: Home / Self Care | Payer: Medicare Other | Source: Ambulatory Visit | Attending: Cardiology | Admitting: Cardiology

## 2018-02-12 ENCOUNTER — Ambulatory Visit (INDEPENDENT_AMBULATORY_CARE_PROVIDER_SITE_OTHER): Payer: Medicare Other | Admitting: Cardiology

## 2018-02-12 VITALS — BP 140/70 | HR 75 | Ht 76.0 in | Wt 183.1 lb

## 2018-02-12 DIAGNOSIS — E785 Hyperlipidemia, unspecified: Secondary | ICD-10-CM | POA: Insufficient documentation

## 2018-02-12 DIAGNOSIS — I2510 Atherosclerotic heart disease of native coronary artery without angina pectoris: Secondary | ICD-10-CM | POA: Insufficient documentation

## 2018-02-12 DIAGNOSIS — I350 Nonrheumatic aortic (valve) stenosis: Secondary | ICD-10-CM | POA: Insufficient documentation

## 2018-02-12 DIAGNOSIS — R0989 Other specified symptoms and signs involving the circulatory and respiratory systems: Secondary | ICD-10-CM | POA: Insufficient documentation

## 2018-02-12 DIAGNOSIS — I10 Essential (primary) hypertension: Secondary | ICD-10-CM | POA: Diagnosis not present

## 2018-02-12 DIAGNOSIS — E782 Mixed hyperlipidemia: Secondary | ICD-10-CM | POA: Diagnosis not present

## 2018-02-12 DIAGNOSIS — E119 Type 2 diabetes mellitus without complications: Secondary | ICD-10-CM | POA: Insufficient documentation

## 2018-02-12 NOTE — Progress Notes (Signed)
*  PRELIMINARY RESULTS* Vascular Ultrasound Carotid Duplex (Doppler) has been completed.  Preliminary findings: Mid/ distal left ICA was not seen, otherwise no ICA stenosis was detected.  Joelene Millin 02/12/2018, 2:58 PM

## 2018-02-12 NOTE — Patient Instructions (Signed)
Medication Instructions:  Your physician recommends that you continue on your current medications as directed. Please refer to the Current Medication list given to you today.   Labwork: None  Testing/Procedures: Your physician has requested that you have a carotid duplex. This test is an ultrasound of the carotid arteries in your neck. It looks at blood flow through these arteries that supply the brain with blood. Allow one hour for this exam. There are no restrictions or special instructions.  You have been referred to cardiothoracic surgery. You will be contacted to schedule an appointment.   Follow-Up: Your physician recommends that you schedule a follow-up appointment in: 1 month.   If you need a refill on your cardiac medications before your next appointment, please call your pharmacy.   Thank you for choosing [facility]! Robyne Peers, RN [phone]

## 2018-02-12 NOTE — Progress Notes (Signed)
Cardiology Office Note:    Date:  02/12/2018   ID:  VERE DIANTONIO, DOB Sep 24, 1950, MRN [id number]  PCP:  Debbrah Alar, NP  Cardiologist:  Jenne Campus, MD    Referring MD: Debbrah Alar, NP   Chief Complaint  Patient presents with  . 1 month follow up  Had cardiac catheterization done  History of Present Illness:    [name] is a 67 y.o. male aortic stenosis.  He was sent to have a cardiac catheterization which was done about a month ago which confirmed presence of critical aortic stenosis.  He was also find to have 65% of proximal LAD.  He comes today to discuss results of those test.  He is doing well denies having any symptoms except some fatigue and tiredness.  He still works some.  We had a long discussion about this scenario and I told him that this is critical aortic valve need to be fixed most likely surgically he does have some shortness of breath he did have pulmonary function test on that I do not have results of we had.  He will be referred to cardiothoracic surgery for consideration of open heart surgery and aortic valve replacement with potentially one graft going to LAD.  He will have schedule carotid ultrasounds to rule out significant cardiac arterial disease.  Past Medical History:  Diagnosis Date  . Abnormal myocardial perfusion study 2006   EF 44% ? inferoseptal ischemia. no cath done  . Breast mass, right 03/2008  . Contact dermatitis 01/31/2013  . Diabetes mellitus type II   . History of colonic polyps   . Hyperlipidemia   . Hypertension   . Lipoma   . Low back pain     Past Surgical History:  Procedure Laterality Date  . ABDOMINAL AORTOGRAM N/A 01/13/2018   Procedure: ABDOMINAL AORTOGRAM;  Surgeon: Troy Sine, MD;  Location: [facility];  Service: Cardiovascular;  Laterality: N/A;  . COLONOSCOPY W/ POLYPECTOMY    . LIPOMA EXCISION  07/2008    Dr Gershon Crane  . RIGHT/LEFT HEART CATH AND CORONARY ANGIOGRAPHY N/A 01/13/2018    Procedure: RIGHT/LEFT HEART CATH AND CORONARY ANGIOGRAPHY;  Surgeon: Troy Sine, MD;  Location: [facility];  Service: Cardiovascular;  Laterality: N/A;  . WISDOM TOOTH EXTRACTION      Current Medications: Current Meds  Medication Sig  . aspirin 81 MG tablet Take 81 mg by mouth daily.    . Cyanocobalamin (VITAMIN B-12 PO) Take 1 tablet by mouth daily.  . dapagliflozin propanediol (FARXIGA) 5 MG TABS tablet Take 5 mg by mouth daily. (Patient taking differently: Take 5 mg by mouth 2 (two) times a week. )  . glucose blood (PRODIGY NO CODING BLOOD GLUC) test strip Use as instructed to check blood sugar twice a day.  DX  E11.40  . insulin aspart (NOVOLOG FLEXPEN) 100 UNIT/ML FlexPen INJECT 11-15 SUBCUTANEOUSLY DAILY BEFORE SUPPER (Patient taking differently: Inject 11 Units into the skin every evening. )  . Insulin Detemir (LEVEMIR FLEXTOUCH) 100 UNIT/ML Pen Inject 60 Units into the skin daily at 10 pm.  . Insulin Pen Needle (B-D ULTRAFINE III SHORT PEN) 31G X 8 MM MISC USE AS DIRECTED  . LEVEMIR FLEXTOUCH 100 UNIT/ML Pen Inject 60 Units into the skin every morning.  . metFORMIN (GLUCOPHAGE-XR) 500 MG 24 hr tablet Take 2 tablets (1,000 mg total) by mouth 2 (two) times daily.  . rosuvastatin (CRESTOR) 20 MG tablet Take 1 tablet (20 mg total)  by mouth daily.   Current Facility-Administered Medications for the 02/12/18 encounter (Office Visit) with Park Liter, MD  Medication  . 0.9 %  sodium chloride infusion     Allergies:   Amlodipine besylate and Hydrochlorothiazide   Social History   Socioeconomic History  . Marital status: Married    Spouse name: Not on file  . Number of children: Not on file  . Years of education: Not on file  . Highest education level: Not on file  Occupational History  . Not on file  Social Needs  . Financial resource strain: Not on file  . Food insecurity:    Worry: Not on file    Inability: Not on file  . Transportation needs:     Medical: Not on file    Non-medical: Not on file  Tobacco Use  . Smoking status: Former Research scientist (life sciences)  . Smokeless tobacco: Never Used  . Tobacco comment: 33 pack year history  Substance and Sexual Activity  . Alcohol use: Yes    Alcohol/week: 1.8 oz    Types: 3 Glasses of wine per week  . Drug use: No  . Sexual activity: Not on file  Lifestyle  . Physical activity:    Days per week: Not on file    Minutes per session: Not on file  . Stress: Not on file  Relationships  . Social connections:    Talks on phone: Not on file    Gets together: Not on file    Attends religious service: Not on file    Active member of club or organization: Not on file    Attends meetings of clubs or organizations: Not on file    Relationship status: Not on file  Other Topics Concern  . 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 There is no history of Colon cancer. ROS:   Please see the history of present illness.    All 14 point review of systems negative except as described per history of present illness  EKGs/Labs/Other Studies Reviewed:      Recent Labs: 12/30/2017: ALT 13; BUN 9; Creatinine, Ser 0.88; Hemoglobin 13.2; Platelets 398.0; Potassium 4.0; Sodium 143  Recent Lipid Panel    Component Value Date/Time   CHOL 137 05/20/2017 1129   TRIG 60.0 05/20/2017 1129   HDL 42.10 05/20/2017 1129   CHOLHDL 3 05/20/2017 1129   VLDL 12.0 05/20/2017 1129   LDLCALC 83 05/20/2017 1129   LDLDIRECT 177.6 10/07/2007 1001    Physical Exam:    VS:  BP 140/70   Pulse 75   Ht 6\' 4"  (1.93 m)   Wt 183 lb 1.9 oz (83.1 kg)   SpO2 98%   BMI 22.29 kg/m     Wt Readings from Last 3 Encounters:   02/12/18 183 lb 1.9 oz (83.1 kg)  02/02/18 186 lb (84.4 kg)  01/13/18 188 lb (85.3 kg)     GEN:  Well nourished, well developed in no acute distress HEENT: Normal NECK: No JVD; bilateral carotid bruit LYMPHATICS: No lymphadenopathy CARDIAC: RRR, ejection murmur grade 2/6 to 3/6  best heard at the right upper portion of the sternum, no rubs, no gallops RESPIRATORY:  Clear to auscultation without rales, wheezing or rhonchi  ABDOMEN: Soft, non-tender, non-distended MUSCULOSKELETAL:  No edema; No deformity  SKIN: Warm and dry LOWER EXTREMITIES: no swelling NEUROLOGIC:  Alert and oriented x 3 PSYCHIATRIC:  Normal affect   ASSESSMENT:    1. Critical aortic valve stenosis   2. Essential hypertension   3. Mixed hyperlipidemia   4. Coronary artery disease involving native coronary artery of native heart without angina pectoris    PLAN:    In order of problems listed above:  1. Aortic valve stenosis he was referred for cardiothoracic evaluation. 2. Essential hypertension: Blood pressure well controlled continue present medications. 3. Dyslipidemia continue with Crestor. CAD - will be addressed by surgery.  Medication Adjustments/Labs and Tests Ordered: Current medicines are reviewed at length with the patient today.  Concerns regarding medicines are outlined above.  No orders of the defined types were placed in this encounter.  Medication changes: No orders of the defined types were placed in this encounter.   Signed, Park Liter, MD, [name] 02/12/2018 10:41 AM    [place]

## 2018-02-17 ENCOUNTER — Institutional Professional Consult (permissible substitution) (INDEPENDENT_AMBULATORY_CARE_PROVIDER_SITE_OTHER): Payer: Medicare Other | Admitting: Thoracic Surgery (Cardiothoracic Vascular Surgery)

## 2018-02-17 ENCOUNTER — Telehealth: Payer: Self-pay | Admitting: *Deleted

## 2018-02-17 VITALS — BP 128/84 | HR 84 | Resp 20 | Ht 76.0 in | Wt 183.0 lb

## 2018-02-17 DIAGNOSIS — I2510 Atherosclerotic heart disease of native coronary artery without angina pectoris: Secondary | ICD-10-CM

## 2018-02-17 DIAGNOSIS — I350 Nonrheumatic aortic (valve) stenosis: Secondary | ICD-10-CM | POA: Diagnosis not present

## 2018-02-17 NOTE — Telephone Encounter (Signed)
Patient informed of carotid ultrasound results. Patient verbalized understanding. No further questions.

## 2018-02-17 NOTE — Progress Notes (Signed)
PCP is Debbrah Alar, NP Referring Provider is Park Liter, MD  Chief Complaint  Patient presents with  . Aortic Stenosis    Surgical eval, ECHO 5 20/19, Cardiac Cath 01/13/18, PFT's 02/04/18, Chest CT 02/04/18    HPI: [name] is sent for consideration for aortic valve replacement and coronary artery bypass grafting.  [name] is a 67 year old man with a history of hypertension, hyperlipidemia, insulin-dependent type 2 diabetes, low back pain, and gynecomastia.  He saw Debbrah Alar for his annual physical and early May.  She noted a new heart murmur.  An echocardiogram was ordered but has not yet been done when he went to the emergency room on 12/29/2017 after "almost passing out."  Chest x-ray showed possible mild edema.  He was felt to be volume depleted and responded to IV fluids.  He saw Dr. Agustin Cree and had an echocardiogram on 01/12/2018.  It showed critical aortic stenosis with a valve area of less than 0.5 cm and a mean gradient of more than 60 mmHg.  He underwent cardiac catheterization on 01/13/2018 which revealed a 65 to 70% proximal LAD stenosis.  There was calcified plaque but no other significant stenoses in the remaining vessels.  Echocardiogram showed ejection fraction of 35 to 40%.  He says that he can walk on level ground without getting short of breath but if he picks up or pull something he will feel very tired and short of breath.  He denies any chest pain, pressure, or tightness along with that.  He works in [employer].   Past Medical History:  Diagnosis Date  . Abnormal myocardial perfusion study 2006   EF 44% ? inferoseptal ischemia. no cath done  . Breast mass, right 03/2008  . Contact dermatitis 01/31/2013  . Diabetes mellitus type II   . History of colonic polyps   . Hyperlipidemia   . Hypertension   . Lipoma   . Low back pain     Past Surgical History:  Procedure Laterality Date  . ABDOMINAL AORTOGRAM N/A 01/13/2018    Procedure: ABDOMINAL AORTOGRAM;  Surgeon: Troy Sine, MD;  Location: [facility];  Service: Cardiovascular;  Laterality: N/A;  . COLONOSCOPY W/ POLYPECTOMY    . LIPOMA EXCISION  07/2008    Dr Gershon Crane  . RIGHT/LEFT HEART CATH AND CORONARY ANGIOGRAPHY N/A 01/13/2018   Procedure: RIGHT/LEFT HEART CATH AND CORONARY ANGIOGRAPHY;  Surgeon: Troy Sine, MD;  Location: [facility];  Service: Cardiovascular;  Laterality: N/A;  . WISDOM TOOTH EXTRACTION      Family History  Problem Relation Age of Onset  . Melanoma Father 42       deceased secondary to melanoma  . Alzheimer's disease Father   . Diabetes Father   . Hypertension Mother        alive -60  . Colon cancer Neg Hx     Social History Social History   Tobacco Use  . Smoking status: Former Research scientist (life sciences)  . Smokeless tobacco: Never Used  . Tobacco comment: 33 pack year history  Substance Use Topics  . Alcohol use: Yes    Alcohol/week: 1.8 oz    Types: 3 Glasses of wine per week  . Drug use: No    Current Outpatient Medications  Medication Sig Dispense Refill  . aspirin 81 MG tablet Take 81 mg by mouth daily.      . Cyanocobalamin (VITAMIN B-12 PO) Take 1 tablet by mouth daily.    . dapagliflozin propanediol (FARXIGA)  5 MG TABS tablet Take 5 mg by mouth daily. (Patient taking differently: Take 5 mg by mouth 2 (two) times a week. ) 30 tablet 5  . glucose blood (PRODIGY NO CODING BLOOD GLUC) test strip Use as instructed to check blood sugar twice a day.  DX  E11.40 100 each 1  . insulin aspart (NOVOLOG FLEXPEN) 100 UNIT/ML FlexPen INJECT 11-15 SUBCUTANEOUSLY DAILY BEFORE SUPPER (Patient taking differently: Inject 11 Units into the skin every evening. ) 15 mL 3  . Insulin Pen Needle (B-D ULTRAFINE III SHORT PEN) 31G X 8 MM MISC USE AS DIRECTED 100 each 5  . LEVEMIR FLEXTOUCH 100 UNIT/ML Pen Inject 60 Units into the skin every morning. 15 mL 3  . metFORMIN (GLUCOPHAGE-XR) 500 MG 24 hr tablet Take 2 tablets (1,000 mg  total) by mouth 2 (two) times daily. 360 tablet 1  . rosuvastatin (CRESTOR) 20 MG tablet Take 1 tablet (20 mg total) by mouth daily. 30 tablet 5   Current Facility-Administered Medications  Medication Dose Route Frequency Provider Last Rate Last Dose  . 0.9 %  sodium chloride infusion  500 mL Intravenous Continuous Doran Stabler, MD        Allergies  Allergen Reactions  . Amlodipine Besylate Other (See Comments)    REACTION: ? caused left axillary nodules/chest flutter  . Hydrochlorothiazide Hives    Review of Systems  Constitutional: Positive for fatigue and unexpected weight change (Lost 5 pounds in 3 months).  HENT: Negative for trouble swallowing and voice change.   Eyes: Negative for visual disturbance.  Respiratory: Positive for shortness of breath. Negative for chest tightness and wheezing.   Cardiovascular: Negative for chest pain and leg swelling.  Gastrointestinal: Negative for abdominal distention and abdominal pain.  Genitourinary: Negative for difficulty urinating and dysuria.  Musculoskeletal: Positive for arthralgias and back pain.  Neurological: Positive for dizziness. Negative for syncope and weakness.  Hematological: Negative for adenopathy. Does not bruise/bleed easily.  All other systems reviewed and are negative.   BP 128/84   Pulse 84   Resp 20   Ht 6\' 4"  (1.93 m)   Wt 183 lb (83 kg)   SpO2 97% Comment: RA  BMI 22.28 kg/m  Physical Exam  Constitutional: He is oriented to person, place, and time. He appears well-developed and well-nourished.  HENT:  Head: Normocephalic and atraumatic.  Mouth/Throat: No oropharyngeal exudate.  Good dentition  Eyes: Conjunctivae and EOM are normal. No scleral icterus.  Neck: No thyromegaly present.  Transmitted murmur bilaterally  Cardiovascular: Normal rate and regular rhythm. Exam reveals no gallop and no friction rub.  Murmur (3/6 crescendo decrescendo right upper sternal border) heard. Pulmonary/Chest:  Effort normal and breath sounds normal. No respiratory distress. He has no rales.  Abdominal: Soft. He exhibits no distension. There is no tenderness.  Musculoskeletal: He exhibits no edema or deformity.  Lymphadenopathy:    He has no cervical adenopathy.  Neurological: He is alert and oriented to person, place, and time. No cranial nerve deficit or sensory deficit. Coordination normal.  Skin: Skin is warm and dry.  Vitals reviewed.    Diagnostic Tests: Echocardiogram 01/12/2018 Study Conclusions  - Left ventricle: The cavity size was normal. Wall thickness was   increased in a pattern of moderate LVH. Systolic function was   moderately reduced. The estimated ejection fraction was in the   range of 35% to 40%. Hypokinesis of the anteroseptal myocardium.   Hypokinesis of the anterior myocardium. Doppler parameters are  consistent with abnormal left ventricular relaxation (grade 1   diastolic dysfunction). - Aortic valve: There was critical stenosis. There was trivial   regurgitation. Valve area (VTI): 0.42 cm^2. Valve area (Vmax):   0.41 cm^2. Valve area (Vmean): 0.4 cm^2. - Mitral valve: There was mild regurgitation. - Pulmonary arteries: PA peak pressure: 41 mm Hg (S).  Impressions:  - Critical Aortic Stenosis. ( mean gradient 31mmHg, AVA <0.5cm2)   MIld AR.   Anerior wall hypokinesis.   Diminished LVEF - 35-40%.   MIld MR. Cardiac catheterization 01/13/2018 Conclusion     Prox LAD lesion is 65% stenosed.  Ramus lesion is 30% stenosed.  Prox Cx lesion is 30% stenosed.  Mid Cx lesion is 30% stenosed.  Prox RCA to Mid RCA lesion is 20% stenosed.  Acute Mrg lesion is 30% stenosed.  There is severe aortic valve stenosis. There is mild (2+) aortic regurgitation.   Mild to moderate coronary obstructive disease with evidence for coronary calcification.  There is 60 to 70% stenosis of the LAD after the takeoff of the first diagonal vessel; 30% stenosis in the ramus  intermediate vessel proximally; 30% proximal mid left circumflex stenoses, and 20% RCA stenosis proximally with 30% stenosis and an RV marginal branch.  Very mild right heart catheterization elevation with mean PA pressure at 30 mm.  Severe critical aortic valve stenosis with a peak instantaneous gradient of 80 mmHg, peak to peak gradient of 56 mm, and mean gradient of 51 mmHg.  The aortic valve area 0.6 cm.    Supravalvular aortography reveals calcified aortic valve with markedly reduced excursion.  There is 1-2+ angiographic aortic insufficiency.  Aortic root is mildly enlarged.  Distal aortography not reveal any distal aortic aneurysm and revealed bilateral iliofemoral  Flow.  RECOMMENDATION: The patient will follow up with Dr. Agustin Cree.  Catheterization confirmed severe aortic stenosis.  He will be referred for surgical evaluation by Dr. Agustin Cree for consideration of AVR/CABG.   I personally reviewed the catheterization images and concur with the findings noted above  Pulmonary function testing 02/04/2018 FVC 3.37 (71%) FEV1 1.78 (49%) FEV1 1.96 (54%) postbronchodilator DLCO 14.75 (37%)  Impression: Mr. Sooy is a 66 year old gentleman with a past history of hypertension, hyperlipidemia, and type 2 diabetes now requiring insulin.  He has no known prior history of heart disease and has never been told he had a heart murmur before.  He was incidentally noted to have a murmur on physical examination back in May.  After that was noted but before he had an echocardiogram he had an near syncopal episode while working.  When he finally did have his echocardiogram and it showed critical aortic stenosis with a valve area of 0.42 cm with mild aortic regurgitation.  There was anterior wall hypokinesis.  His ejection fraction was 35 to 40%.  There was mild mitral regurgitation.  At catheterization he was found to have critical aortic stenosis with a peak gradient of 80 mmHg, mean gradient  of 51 mmHg, and an aortic valve area of 0.6 cm.  He also had significant single-vessel coronary disease involving the proximal LAD.  Aortic valve replacement and coronary artery bypass grafting is indicated for survival benefit and relief of symptoms.  I discussed the options of tissue versus mechanical valve for the aortic valve replacement.  Given that his age is greater than 65 think a tissue valve is a better option for him.  He is in agreement with that.  I discussed the general nature of the  operation with him.  We discussed the need for general anesthesia, the incision to be used, the use of cardiopulmonary bypass, use of drainage tubes postoperatively, the expected hospital stay, and the overall recovery.  I informed him and his wife of the indications, risks, benefits, and alternatives.  They understand the risks include, but are not limited to death, MI, DVT, PE, stroke, bleeding, possible need for transfusion, infection, renal or respiratory failure, heart block, as well as the possibility of other unforeseeable complications.  He has good dentition.  He is due for a dental cleaning in July.  I asked him to call his dentist to see if they can move that up prior to his surgery if at all possible.  Plan: Aortic valve replacement with tissue valve and coronary bypass grafting x1 on Wednesday, 03/04/2018  Melrose Nakayama, MD [facility] [phone]

## 2018-02-17 NOTE — H&P (View-Only) (Signed)
PCP is [name] Alar, NP Referring Provider is [name] Liter, MD  Chief Complaint  Patient presents with  . Aortic Stenosis    Surgical eval, ECHO 5 20/19, Cardiac Cath 01/13/18, PFT's 02/04/18, Chest CT 02/04/18    HPI: [name] is sent for consideration for aortic valve replacement and coronary artery bypass grafting.  [name] is a 67 year old man with a history of hypertension, hyperlipidemia, insulin-dependent type 2 diabetes, low back pain, and gynecomastia.  He saw [name] for his annual physical and early May.  She noted a new heart murmur.  An echocardiogram was ordered but has not yet been done when he went to the emergency room on 12/29/2017 after "almost passing out."  Chest x-ray showed possible mild edema.  He was felt to be volume depleted and responded to IV fluids.  He saw Dr. Agustin Cree and had an echocardiogram on 01/12/2018.  It showed critical aortic stenosis with a valve area of less than 0.5 cm and a mean gradient of more than 60 mmHg.  He underwent cardiac catheterization on 01/13/2018 which revealed a 65 to 70% proximal LAD stenosis.  There was calcified plaque but no other significant stenoses in the remaining vessels.  Echocardiogram showed ejection fraction of 35 to 40%.  He says that he can walk on level ground without getting short of breath but if he picks up or pull something he will feel very tired and short of breath.  He denies any chest pain, pressure, or tightness along with that.  He works in [employer].   Past Medical History:  Diagnosis Date  . Abnormal myocardial perfusion study 2006   EF 44% ? inferoseptal ischemia. no cath done  . Breast mass, right 03/2008  . Contact dermatitis 01/31/2013  . Diabetes mellitus type II   . History of colonic polyps   . Hyperlipidemia   . Hypertension   . Lipoma   . Low back pain     Past Surgical History:  Procedure Laterality Date  . ABDOMINAL AORTOGRAM N/A 01/13/2018    Procedure: ABDOMINAL AORTOGRAM;  Surgeon: [name] Sine, MD;  Location: [facility];  Service: Cardiovascular;  Laterality: N/A;  . COLONOSCOPY W/ POLYPECTOMY    . LIPOMA EXCISION  07/2008    Dr [name]  . RIGHT/LEFT HEART CATH AND CORONARY ANGIOGRAPHY N/A 01/13/2018   Procedure: RIGHT/LEFT HEART CATH AND CORONARY ANGIOGRAPHY;  Surgeon: [name] Sine, MD;  Location: [facility];  Service: Cardiovascular;  Laterality: N/A;  . WISDOM TOOTH EXTRACTION      Family History  Problem Relation Age of Onset  . Melanoma Father 77       deceased secondary to melanoma  . Alzheimer's disease Father   . Diabetes Father   . Hypertension Mother        alive -61  . Colon cancer Neg Hx     Social History Social History   Tobacco Use  . Smoking status: Former Research scientist (life sciences)  . Smokeless tobacco: Never Used  . Tobacco comment: 33 pack year history  Substance Use Topics  . Alcohol use: Yes    Alcohol/week: 1.8 oz    Types: 3 Glasses of wine per week  . Drug use: No    Current Outpatient Medications  Medication Sig Dispense Refill  . aspirin 81 MG tablet Take 81 mg by mouth daily.      . Cyanocobalamin (VITAMIN B-12 PO) Take 1 tablet by mouth daily.    . dapagliflozin propanediol (FARXIGA)  5 MG TABS tablet Take 5 mg by mouth daily. (Patient taking differently: Take 5 mg by mouth 2 (two) times a week. ) 30 tablet 5  . glucose blood (PRODIGY NO CODING BLOOD GLUC) test strip Use as instructed to check blood sugar twice a day.  DX  E11.40 100 each 1  . insulin aspart (NOVOLOG FLEXPEN) 100 UNIT/ML FlexPen INJECT 11-15 SUBCUTANEOUSLY DAILY BEFORE SUPPER (Patient taking differently: Inject 11 Units into the skin every evening. ) 15 mL 3  . Insulin Pen Needle (B-D ULTRAFINE III SHORT PEN) 31G X 8 MM MISC USE AS DIRECTED 100 each 5  . LEVEMIR FLEXTOUCH 100 UNIT/ML Pen Inject 60 Units into the skin every morning. 15 mL 3  . metFORMIN (GLUCOPHAGE-XR) 500 MG 24 hr tablet Take 2 tablets (1,000 mg  total) by mouth 2 (two) times daily. 360 tablet 1  . rosuvastatin (CRESTOR) 20 MG tablet Take 1 tablet (20 mg total) by mouth daily. 30 tablet 5   Current Facility-Administered Medications  Medication Dose Route Frequency Provider Last Rate Last Dose  . 0.9 %  sodium chloride infusion  500 mL Intravenous Continuous Doran Stabler, MD        Allergies  Allergen Reactions  . Amlodipine Besylate Other (See Comments)    REACTION: ? caused left axillary nodules/chest flutter  . Hydrochlorothiazide Hives    Review of Systems  Constitutional: Positive for fatigue and unexpected weight change (Lost 5 pounds in 3 months).  HENT: Negative for trouble swallowing and voice change.   Eyes: Negative for visual disturbance.  Respiratory: Positive for shortness of breath. Negative for chest tightness and wheezing.   Cardiovascular: Negative for chest pain and leg swelling.  Gastrointestinal: Negative for abdominal distention and abdominal pain.  Genitourinary: Negative for difficulty urinating and dysuria.  Musculoskeletal: Positive for arthralgias and back pain.  Neurological: Positive for dizziness. Negative for syncope and weakness.  Hematological: Negative for adenopathy. Does not bruise/bleed easily.  All other systems reviewed and are negative.   BP 128/84   Pulse 84   Resp 20   Ht 6\' 4"  (1.93 m)   Wt 183 lb (83 kg)   SpO2 97% Comment: RA  BMI 22.28 kg/m  Physical Exam  Constitutional: He is oriented to person, place, and time. He appears well-developed and well-nourished.  HENT:  Head: Normocephalic and atraumatic.  Mouth/Throat: No oropharyngeal exudate.  Good dentition  Eyes: Conjunctivae and EOM are normal. No scleral icterus.  Neck: No thyromegaly present.  Transmitted murmur bilaterally  Cardiovascular: Normal rate and regular rhythm. Exam reveals no gallop and no friction rub.  Murmur (3/6 crescendo decrescendo right upper sternal border) heard. Pulmonary/Chest:  Effort normal and breath sounds normal. No respiratory distress. He has no rales.  Abdominal: Soft. He exhibits no distension. There is no tenderness.  Musculoskeletal: He exhibits no edema or deformity.  Lymphadenopathy:    He has no cervical adenopathy.  Neurological: He is alert and oriented to person, place, and time. No cranial nerve deficit or sensory deficit. Coordination normal.  Skin: Skin is warm and dry.  Vitals reviewed.    Diagnostic Tests: Echocardiogram 01/12/2018 Study Conclusions  - Left ventricle: The cavity size was normal. Wall thickness was   increased in a pattern of moderate LVH. Systolic function was   moderately reduced. The estimated ejection fraction was in the   range of 35% to 40%. Hypokinesis of the anteroseptal myocardium.   Hypokinesis of the anterior myocardium. Doppler parameters are  consistent with abnormal left ventricular relaxation (grade 1   diastolic dysfunction). - Aortic valve: There was critical stenosis. There was trivial   regurgitation. Valve area (VTI): 0.42 cm^2. Valve area (Vmax):   0.41 cm^2. Valve area (Vmean): 0.4 cm^2. - Mitral valve: There was mild regurgitation. - Pulmonary arteries: PA peak pressure: 41 mm Hg (S).  Impressions:  - Critical Aortic Stenosis. ( mean gradient 71mmHg, AVA <0.5cm2)   MIld AR.   Anerior wall hypokinesis.   Diminished LVEF - 35-40%.   MIld MR. Cardiac catheterization 01/13/2018 Conclusion     Prox LAD lesion is 65% stenosed.  Ramus lesion is 30% stenosed.  Prox Cx lesion is 30% stenosed.  Mid Cx lesion is 30% stenosed.  Prox RCA to Mid RCA lesion is 20% stenosed.  Acute Mrg lesion is 30% stenosed.  There is severe aortic valve stenosis. There is mild (2+) aortic regurgitation.   Mild to moderate coronary obstructive disease with evidence for coronary calcification.  There is 60 to 70% stenosis of the LAD after the takeoff of the first diagonal vessel; 30% stenosis in the ramus  intermediate vessel proximally; 30% proximal mid left circumflex stenoses, and 20% RCA stenosis proximally with 30% stenosis and an RV marginal branch.  Very mild right heart catheterization elevation with mean PA pressure at 30 mm.  Severe critical aortic valve stenosis with a peak instantaneous gradient of 80 mmHg, peak to peak gradient of 56 mm, and mean gradient of 51 mmHg.  The aortic valve area 0.6 cm.    Supravalvular aortography reveals calcified aortic valve with markedly reduced excursion.  There is 1-2+ angiographic aortic insufficiency.  Aortic root is mildly enlarged.  Distal aortography not reveal any distal aortic aneurysm and revealed bilateral iliofemoral  Flow.  RECOMMENDATION: The patient will follow up with Dr. Agustin Cree.  Catheterization confirmed severe aortic stenosis.  He will be referred for surgical evaluation by Dr. Agustin Cree for consideration of AVR/CABG.   I personally reviewed the catheterization images and concur with the findings noted above  Pulmonary function testing 02/04/2018 FVC 3.37 (71%) FEV1 1.78 (49%) FEV1 1.96 (54%) postbronchodilator DLCO 14.75 (37%)  Impression: [name] is a 67 year old gentleman with a past history of hypertension, hyperlipidemia, and type 2 diabetes now requiring insulin.  He has no known prior history of heart disease and has never been told he had a heart murmur before.  He was incidentally noted to have a murmur on physical examination back in May.  After that was noted but before he had an echocardiogram he had an near syncopal episode while working.  When he finally did have his echocardiogram and it showed critical aortic stenosis with a valve area of 0.42 cm with mild aortic regurgitation.  There was anterior wall hypokinesis.  His ejection fraction was 35 to 40%.  There was mild mitral regurgitation.  At catheterization he was found to have critical aortic stenosis with a peak gradient of 80 mmHg, mean gradient  of 51 mmHg, and an aortic valve area of 0.6 cm.  He also had significant single-vessel coronary disease involving the proximal LAD.  Aortic valve replacement and coronary artery bypass grafting is indicated for survival benefit and relief of symptoms.  I discussed the options of tissue versus mechanical valve for the aortic valve replacement.  Given that his age is greater than 39 think a tissue valve is a better option for him.  He is in agreement with that.  I discussed the general nature of the  operation with him.  We discussed the need for general anesthesia, the incision to be used, the use of cardiopulmonary bypass, use of drainage tubes postoperatively, the expected hospital stay, and the overall recovery.  I informed him and his wife of the indications, risks, benefits, and alternatives.  They understand the risks include, but are not limited to death, MI, DVT, PE, stroke, bleeding, possible need for transfusion, infection, renal or respiratory failure, heart block, as well as the possibility of other unforeseeable complications.  He has good dentition.  He is due for a dental cleaning in July.  I asked him to call his dentist to see if they can move that up prior to his surgery if at all possible.  Plan: Aortic valve replacement with tissue valve and coronary bypass grafting x1 on Wednesday, 03/04/2018  Melrose Nakayama, MD [facility] [phone]

## 2018-02-18 ENCOUNTER — Other Ambulatory Visit: Payer: Self-pay | Admitting: *Deleted

## 2018-02-18 ENCOUNTER — Encounter: Payer: Self-pay | Admitting: *Deleted

## 2018-02-18 DIAGNOSIS — I2510 Atherosclerotic heart disease of native coronary artery without angina pectoris: Secondary | ICD-10-CM

## 2018-02-18 DIAGNOSIS — I350 Nonrheumatic aortic (valve) stenosis: Secondary | ICD-10-CM

## 2018-02-27 NOTE — Pre-Procedure Instructions (Signed)
AHMARI DUERSON  02/27/2018      [id number] [place], [place] [address] Phone: [phone] Fax: [phone]    Your procedure is scheduled on  Wednesday  03/04/18  Report to [facility] Admitting at 630 A.M.  Call this number if you have problems the morning of surgery:  [phone]   Remember:  Do not eat or drink after midnight.     Take these medicines the morning of surgery with A SIP OF WATER -       How to Manage Your Diabetes Before and After Surgery  Why is it important to control my blood sugar before and after surgery? . Improving blood sugar levels before and after surgery helps healing and can limit problems. . A way of improving blood sugar control is eating a healthy diet by: o  Eating less sugar and carbohydrates o  Increasing activity/exercise o  Talking with your doctor about reaching your blood sugar goals . High blood sugars (greater than 180 mg/dL) can raise your risk of infections and slow your recovery, so you will need to focus on controlling your diabetes during the weeks before surgery. . Make sure that the doctor who takes care of your diabetes knows about your planned surgery including the date and location.  How do I manage my blood sugar before surgery? . Check your blood sugar at least 4 times a day, starting 2 days before surgery, to make sure that the level is not too high or low. o Check your blood sugar the morning of your surgery when you wake up and every 2 hours until you get to the Short Stay unit. . If your blood sugar is less than 70 mg/dL, you will need to treat for low blood sugar: o Do not take insulin. o Treat a low blood sugar (less than 70 mg/dL) with  cup of clear juice (cranberry or apple), 4 glucose tablets, OR glucose gel. Recheck blood sugar in 15 minutes after treatment (to make sure it is greater than 70 mg/dL). If your blood sugar is not greater  than 70 mg/dL on recheck, call [phone] o  for further instructions. . Report your blood sugar to the short stay nurse when you get to Short Stay.  . If you are admitted to the hospital after surgery: o Your blood sugar will be checked by the staff and you will probably be given insulin after surgery (instead of oral diabetes medicines) to make sure you have good blood sugar levels. o The goal for blood sugar control after surgery is 80-180 mg/dL.              WHAT DO I DO ABOUT MY DIABETES MEDICATION?   Marland Kitchen Do not take oral diabetes medicines (pills) the morning of surgery.  . THE NIGHT BEFORE SURGERY, take ___________ units of ___________insulin.       . THE MORNING OF SURGERY, take _____________ units of __________insulin.  . The day of surgery, do not take other diabetes injectables, including Byetta (exenatide), Bydureon (exenatide ER), Victoza (liraglutide), or Trulicity (dulaglutide).  . If your CBG is greater than 220 mg/dL, you may take  of your sliding scale (correction) dose of insulin.  Other Instructions:          Patient Signature:  Date:   Nurse Signature:  Date:   Reviewed and Endorsed by [facility] Patient Education Committee, August 2015   Do not  wear jewelry, make-up or nail polish.  Do not wear lotions, powders, or perfumes, or deodorant.  Do not shave 48 hours prior to surgery.  Men may shave face and neck.  Do not bring valuables to the hospital.  [facility] is not responsible for any belongings or valuables.  Contacts, dentures or bridgework may not be worn into surgery.  Leave your suitcase in the car.  After surgery it may be brought to your room.  For patients admitted to the hospital, discharge time will be determined by your treatment team.  Patients discharged the day of surgery will not be allowed to drive home.   Name and phone number of your driver:    Special instructions:  7 days prior to surgery STOP taking any  Aspirin(unless otherwise instructed by your surgeon), Aleve, Naproxen, Ibuprofen, Motrin, Advil, Goody's, BC's, all herbal medications, fish oil, and all vitamins  Please read over the following fact sheets that you were given. Pain Booklet, MRSA Information and Surgical Site Infection Prevention

## 2018-03-02 ENCOUNTER — Ambulatory Visit (HOSPITAL_COMMUNITY)
Admission: RE | Admit: 2018-03-02 | Discharge: 2018-03-02 | Disposition: A | Discharge status: Home / Self Care | Payer: Medicare Other | Source: Ambulatory Visit | Attending: Thoracic Surgery (Cardiothoracic Vascular Surgery) | Admitting: Thoracic Surgery (Cardiothoracic Vascular Surgery)

## 2018-03-02 ENCOUNTER — Encounter (HOSPITAL_COMMUNITY)
Admission: RE | Admit: 2018-03-02 | Discharge: 2018-03-02 | Disposition: A | Discharge status: Home / Self Care | Payer: Medicare Other | Source: Ambulatory Visit | Attending: Thoracic Surgery (Cardiothoracic Vascular Surgery) | Admitting: Thoracic Surgery (Cardiothoracic Vascular Surgery)

## 2018-03-02 ENCOUNTER — Ambulatory Visit (HOSPITAL_BASED_OUTPATIENT_CLINIC_OR_DEPARTMENT_OTHER)
Admission: RE | Admit: 2018-03-02 | Discharge: 2018-03-02 | Disposition: A | Discharge status: Home / Self Care | Payer: Medicare Other | Source: Ambulatory Visit | Attending: Thoracic Surgery (Cardiothoracic Vascular Surgery) | Admitting: Thoracic Surgery (Cardiothoracic Vascular Surgery)

## 2018-03-02 ENCOUNTER — Encounter (HOSPITAL_COMMUNITY): Payer: Self-pay

## 2018-03-02 DIAGNOSIS — I2510 Atherosclerotic heart disease of native coronary artery without angina pectoris: Secondary | ICD-10-CM

## 2018-03-02 DIAGNOSIS — J439 Emphysema, unspecified: Secondary | ICD-10-CM

## 2018-03-02 DIAGNOSIS — Z794 Long term (current) use of insulin: Secondary | ICD-10-CM | POA: Insufficient documentation

## 2018-03-02 DIAGNOSIS — Z01810 Encounter for preprocedural cardiovascular examination: Secondary | ICD-10-CM | POA: Insufficient documentation

## 2018-03-02 DIAGNOSIS — Z888 Allergy status to other drugs, medicaments and biological substances status: Secondary | ICD-10-CM | POA: Insufficient documentation

## 2018-03-02 DIAGNOSIS — Z01812 Encounter for preprocedural laboratory examination: Secondary | ICD-10-CM | POA: Insufficient documentation

## 2018-03-02 DIAGNOSIS — M545 Low back pain: Secondary | ICD-10-CM | POA: Insufficient documentation

## 2018-03-02 DIAGNOSIS — Z01818 Encounter for other preprocedural examination: Secondary | ICD-10-CM

## 2018-03-02 DIAGNOSIS — I700 Atherosclerosis of aorta: Secondary | ICD-10-CM | POA: Insufficient documentation

## 2018-03-02 DIAGNOSIS — Z7982 Long term (current) use of aspirin: Secondary | ICD-10-CM | POA: Insufficient documentation

## 2018-03-02 DIAGNOSIS — I350 Nonrheumatic aortic (valve) stenosis: Secondary | ICD-10-CM

## 2018-03-02 DIAGNOSIS — E119 Type 2 diabetes mellitus without complications: Secondary | ICD-10-CM | POA: Insufficient documentation

## 2018-03-02 DIAGNOSIS — I97190 Other postprocedural cardiac functional disturbances following cardiac surgery: Secondary | ICD-10-CM | POA: Diagnosis not present

## 2018-03-02 DIAGNOSIS — J9811 Atelectasis: Secondary | ICD-10-CM | POA: Diagnosis not present

## 2018-03-02 DIAGNOSIS — D62 Acute posthemorrhagic anemia: Secondary | ICD-10-CM | POA: Diagnosis not present

## 2018-03-02 DIAGNOSIS — Z87891 Personal history of nicotine dependence: Secondary | ICD-10-CM | POA: Insufficient documentation

## 2018-03-02 DIAGNOSIS — E785 Hyperlipidemia, unspecified: Secondary | ICD-10-CM | POA: Insufficient documentation

## 2018-03-02 DIAGNOSIS — Z79899 Other long term (current) drug therapy: Secondary | ICD-10-CM | POA: Insufficient documentation

## 2018-03-02 DIAGNOSIS — I471 Supraventricular tachycardia: Secondary | ICD-10-CM | POA: Diagnosis not present

## 2018-03-02 DIAGNOSIS — N62 Hypertrophy of breast: Secondary | ICD-10-CM

## 2018-03-02 DIAGNOSIS — I10 Essential (primary) hypertension: Secondary | ICD-10-CM | POA: Diagnosis not present

## 2018-03-02 DIAGNOSIS — J90 Pleural effusion, not elsewhere classified: Secondary | ICD-10-CM | POA: Diagnosis not present

## 2018-03-02 DIAGNOSIS — I472 Ventricular tachycardia: Secondary | ICD-10-CM | POA: Diagnosis not present

## 2018-03-02 HISTORY — DX: Chronic obstructive pulmonary disease, unspecified: J44.9

## 2018-03-02 HISTORY — DX: Atherosclerotic heart disease of native coronary artery without angina pectoris: I25.10

## 2018-03-02 LAB — URINALYSIS, ROUTINE W REFLEX MICROSCOPIC
BILIRUBIN URINE: NEGATIVE
Bacteria, UA: NONE SEEN
Hgb urine dipstick: NEGATIVE
Ketones, ur: NEGATIVE mg/dL
Leukocytes, UA: NEGATIVE
NITRITE: NEGATIVE
PH: 5 (ref 5.0–8.0)
Protein, ur: NEGATIVE mg/dL
SPECIFIC GRAVITY, URINE: 1.021 (ref 1.005–1.030)

## 2018-03-02 LAB — COMPREHENSIVE METABOLIC PANEL
ALBUMIN: 3.8 g/dL (ref 3.5–5.0)
ALT: 10 U/L (ref 0–44)
AST: 15 U/L (ref 15–41)
Alkaline Phosphatase: 44 U/L (ref 38–126)
Anion gap: 7 (ref 5–15)
BILIRUBIN TOTAL: 0.6 mg/dL (ref 0.3–1.2)
BUN: 12 mg/dL (ref 8–23)
CHLORIDE: 109 mmol/L (ref 98–111)
CO2: 23 mmol/L (ref 22–32)
CREATININE: 0.78 mg/dL (ref 0.61–1.24)
Calcium: 9.2 mg/dL (ref 8.9–10.3)
GFR calc Af Amer: >60 mL/min (ref >60)
GFR calc non Af Amer: >60 mL/min (ref >60)
GLUCOSE: 91 mg/dL (ref 70–99)
Potassium: 4.4 mmol/L (ref 3.5–5.1)
SODIUM: 139 mmol/L (ref 135–145)
Total Protein: 7 g/dL (ref 6.5–8.1)

## 2018-03-02 LAB — HEMOGLOBIN A1C
HEMOGLOBIN A1C: 7.3 % — AB (ref 4.8–5.6)
Mean Plasma Glucose: 162.81 mg/dL

## 2018-03-02 LAB — BLOOD GAS, ARTERIAL
ACID-BASE EXCESS: 0.1 mmol/L (ref 0.0–2.0)
BICARBONATE: 23.9 mmol/L (ref 20.0–28.0)
Drawn by: 470591
FIO2: 21
O2 SAT: 95.2 %
PATIENT TEMPERATURE: 98.6
PCO2 ART: 37.2 mmHg (ref 32.0–48.0)
PO2 ART: 81.1 mmHg — AB (ref 83.0–108.0)
pH, Arterial: 7.424 (ref 7.350–7.450)

## 2018-03-02 LAB — CBC
HCT: 48.1 % (ref 39.0–52.0)
Hemoglobin: 14.7 g/dL (ref 13.0–17.0)
MCH: 23.1 pg — ABNORMAL LOW (ref 26.0–34.0)
MCHC: 30.6 g/dL (ref 30.0–36.0)
MCV: 75.6 fL — ABNORMAL LOW (ref 78.0–100.0)
PLATELETS: 536 10*3/uL — AB (ref 150–400)
RBC: 6.36 MIL/uL — AB (ref 4.22–5.81)
RDW: 16.5 % — AB (ref 11.5–15.5)
WBC: 6.9 10*3/uL (ref 4.0–10.5)

## 2018-03-02 LAB — PROTIME-INR
INR: 1.2
Prothrombin Time: 15.1 seconds (ref 11.4–15.2)

## 2018-03-02 LAB — GLUCOSE, CAPILLARY: GLUCOSE-CAPILLARY: 105 mg/dL — AB (ref 70–99)

## 2018-03-02 LAB — SURGICAL PCR SCREEN
MRSA, PCR: NEGATIVE
Staphylococcus aureus: NEGATIVE

## 2018-03-02 LAB — TYPE AND SCREEN
ABO/RH(D): O POS
Antibody Screen: NEGATIVE

## 2018-03-02 LAB — APTT: aPTT: 34 seconds (ref 24–36)

## 2018-03-02 LAB — ABO/RH: ABO/RH(D): O POS

## 2018-03-02 NOTE — Progress Notes (Signed)
Pre-op Cardiac Surgery  Upper Extremity Right Left  Brachial Pressures 136, Tri 135, Tri  Radial Waveforms Tri Tri  Ulnar Waveforms Tri Tri  Palmar Arch (Allen's Test) waveform reverses with radial compression and is nearly unchanged with ulnar compression   waveform increases with radial compression and reverses with ulnar compression     Lower  Extremity Right Left  Dorsalis Pedis 135, Bi 134, Bi  Posterior Tibial 131, Bi 134, Bi  Ankle/Brachial Indices 0.99 1.0   Lita Mains- RDMS, RVT 2:28 PM  03/02/2018

## 2018-03-02 NOTE — Pre-Procedure Instructions (Signed)
[name]  03/02/2018      [id number] - [place], [place] [address] Phone: [phone] Fax: [phone]    Your procedure is scheduled on  Wednesday  03/04/18  Report to [facility] Admitting at 630 A.M.  Call this number if you have problems the morning of surgery:  [phone]   Remember:  Do not eat or drink after midnight.     Take these medicines the morning of surgery with A SIP OF WATER -     TAKE 30 units Levemir (Half of dose) the morning of surgery  DO NOT TAKE dapagliflozin propanediol (Farxiga) or metformin (glucophage) the day of surgery  TAKE Novolog 10 units with supper the night before surgery as usual.  7 days prior to surgery STOP taking any Aleve, Naproxen, Ibuprofen, Motrin, Advil, Goody's, BC's, all herbal medications, fish oil, and all vitamins    How to Manage Your Diabetes Before and After Surgery  Why is it important to control my blood sugar before and after surgery? . Improving blood sugar levels before and after surgery helps healing and can limit problems. . A way of improving blood sugar control is eating a healthy diet by: o  Eating less sugar and carbohydrates o  Increasing activity/exercise o  Talking with your doctor about reaching your blood sugar goals . High blood sugars (greater than 180 mg/dL) can raise your risk of infections and slow your recovery, so you will need to focus on controlling your diabetes during the weeks before surgery. . Make sure that the doctor who takes care of your diabetes knows about your planned surgery including the date and location.  How do I manage my blood sugar before surgery? . Check your blood sugar at least 4 times a day, starting 2 days before surgery, to make sure that the level is not too high or low. o Check your blood sugar the morning of your surgery when you wake up and every 2 hours until you get to the Short Stay  unit. . If your blood sugar is less than 70 mg/dL, you will need to treat for low blood sugar: o Do not take insulin. o Treat a low blood sugar (less than 70 mg/dL) with  cup of clear juice (cranberry or apple), 4 glucose tablets, OR glucose gel. Recheck blood sugar in 15 minutes after treatment (to make sure it is greater than 70 mg/dL). If your blood sugar is not greater than 70 mg/dL on recheck, call [phone] o  for further instructions. . Report your blood sugar to the short stay nurse when you get to Short Stay.  . If you are admitted to the hospital after surgery: o Your blood sugar will be checked by the staff and you will probably be given insulin after surgery (instead of oral diabetes medicines) to make sure you have good blood sugar levels. o The goal for blood sugar control after surgery is 80-180 mg/dL.              Do not wear jewelry, make-up or nail polish.  Do not wear lotions, powders, or perfumes, or deodorant.  Do not shave 48 hours prior to surgery.  Men may shave face and neck.  Do not bring valuables to the hospital.  [facility] is not responsible for any belongings or valuables.  Contacts, dentures or bridgework may not be worn into surgery.  Leave your suitcase in the  car.  After surgery it may be brought to your room.  For patients admitted to the hospital, discharge time will be determined by your treatment team.  Patients discharged the day of surgery will not be allowed to drive home.   Name and phone number of your driver:    Special instructions:  \  [place]- Preparing For Surgery  Before surgery, you can play an important role. Because skin is not sterile, your skin needs to be as free of germs as possible. You can reduce the number of germs on your skin by washing with CHG (chlorahexidine gluconate) Soap before surgery.  CHG is an antiseptic cleaner which kills germs and bonds with the skin to continue killing germs even after washing.     Oral Hygiene is also important to reduce your risk of infection.  Remember - BRUSH YOUR TEETH THE MORNING OF SURGERY WITH YOUR REGULAR TOOTHPASTE  Please do not use if you have an allergy to CHG or antibacterial soaps. If your skin becomes reddened/irritated stop using the CHG.  Do not shave (including legs and underarms) for at least 48 hours prior to first CHG shower. It is OK to shave your face.  Please follow these instructions carefully.   1. Shower the NIGHT BEFORE SURGERY and the MORNING OF SURGERY with CHG.   2. If you chose to wash your hair, wash your hair first as usual with your normal shampoo.  3. After you shampoo, rinse your hair and body thoroughly to remove the shampoo.  4. Use CHG as you would any other liquid soap. You can apply CHG directly to the skin and wash gently with a scrungie or a clean washcloth.   5. Apply the CHG Soap to your body ONLY FROM THE NECK DOWN.  Do not use on open wounds or open sores. Avoid contact with your eyes, ears, mouth and genitals (private parts). Wash Face and genitals (private parts)  with your normal soap.  6. Wash thoroughly, paying special attention to the area where your surgery will be performed.  7. Thoroughly rinse your body with warm water from the neck down.  8. DO NOT shower/wash with your normal soap after using and rinsing off the CHG Soap.  9. Pat yourself dry with a CLEAN TOWEL.  10. Wear CLEAN PAJAMAS to bed the night before surgery, wear comfortable clothes the morning of surgery  11. Place CLEAN SHEETS on your bed the night of your first shower and DO NOT SLEEP WITH PETS.    Day of Surgery:  Do not apply any deodorants/lotions.  Please wear clean clothes to the hospital/surgery center.   Remember to brush your teeth WITH YOUR REGULAR TOOTHPASTE.    Please read over the following fact sheets that you were given. Pain Booklet, MRSA Information and Surgical Site Infection Prevention

## 2018-03-03 MED ORDER — TRANEXAMIC ACID 1000 MG/10ML IV SOLN
1.5000 mg/kg/h | INTRAVENOUS | Status: AC
  Administered 2018-03-04: 1.5 mg/kg/h via INTRAVENOUS
  Filled 2018-03-03: qty 25

## 2018-03-03 MED ORDER — KENNESTONE BLOOD CARDIOPLEGIA VIAL
13.0000 mL | Freq: Once | Status: DC
  Filled 2018-03-03: qty 13

## 2018-03-03 MED ORDER — SODIUM CHLORIDE 0.9 % IV SOLN
INTRAVENOUS | Status: DC
  Filled 2018-03-03: qty 30

## 2018-03-03 MED ORDER — VANCOMYCIN HCL 10 G IV SOLR
1250.0000 mg | INTRAVENOUS | Status: AC
  Administered 2018-03-04: 1250 mg via INTRAVENOUS
  Filled 2018-03-03: qty 1250

## 2018-03-03 MED ORDER — TRANEXAMIC ACID (OHS) BOLUS VIA INFUSION
15.0000 mg/kg | INTRAVENOUS | Status: AC
  Administered 2018-03-04: 1237.5 mg via INTRAVENOUS
  Filled 2018-03-03: qty 1238

## 2018-03-03 MED ORDER — SODIUM CHLORIDE 0.9 % IV SOLN
30.0000 ug/min | INTRAVENOUS | Status: AC
  Administered 2018-03-04: 50 ug/min via INTRAVENOUS
  Filled 2018-03-03: qty 2

## 2018-03-03 MED ORDER — SODIUM CHLORIDE 0.9 % IV SOLN
750.0000 mg | INTRAVENOUS | Status: AC
  Administered 2018-03-04: 750 mg via INTRAVENOUS
  Filled 2018-03-03: qty 750

## 2018-03-03 MED ORDER — DEXMEDETOMIDINE HCL IN NACL 400 MCG/100ML IV SOLN
0.1000 ug/kg/h | INTRAVENOUS | Status: AC
  Administered 2018-03-04: .4 ug/kg/h via INTRAVENOUS
  Filled 2018-03-03: qty 100

## 2018-03-03 MED ORDER — MILRINONE LACTATE IN DEXTROSE 20-5 MG/100ML-% IV SOLN
0.3750 ug/kg/min | INTRAVENOUS | Status: DC
  Filled 2018-03-03: qty 100

## 2018-03-03 MED ORDER — KENNESTONE BLOOD CARDIOPLEGIA (KBC) MANNITOL SYRINGE (20%, 32ML)
32.0000 mL | Freq: Once | INTRAVENOUS | Status: DC
  Filled 2018-03-03: qty 32

## 2018-03-03 MED ORDER — POTASSIUM CHLORIDE 2 MEQ/ML IV SOLN
80.0000 meq | INTRAVENOUS | Status: DC
  Filled 2018-03-03: qty 40

## 2018-03-03 MED ORDER — SODIUM CHLORIDE 0.9 % IV SOLN
INTRAVENOUS | Status: AC
  Administered 2018-03-04: 1.8 [IU]/h via INTRAVENOUS
  Filled 2018-03-03: qty 1

## 2018-03-03 MED ORDER — MAGNESIUM SULFATE 50 % IJ SOLN
40.0000 meq | INTRAMUSCULAR | Status: DC
  Filled 2018-03-03: qty 9.85

## 2018-03-03 MED ORDER — SODIUM CHLORIDE 0.9 % IV SOLN
1.5000 g | INTRAVENOUS | Status: AC
  Administered 2018-03-04: 1.5 g via INTRAVENOUS
  Filled 2018-03-03: qty 1.5

## 2018-03-03 MED ORDER — NITROGLYCERIN IN D5W 200-5 MCG/ML-% IV SOLN
2.0000 ug/min | INTRAVENOUS | Status: DC
  Filled 2018-03-03: qty 250

## 2018-03-03 MED ORDER — TRANEXAMIC ACID (OHS) PUMP PRIME SOLUTION
2.0000 mg/kg | INTRAVENOUS | Status: DC
  Filled 2018-03-03: qty 1.65

## 2018-03-03 MED ORDER — EPINEPHRINE PF 1 MG/ML IJ SOLN
0.0000 ug/min | INTRAVENOUS | Status: DC
  Filled 2018-03-03: qty 4

## 2018-03-03 MED ORDER — DOPAMINE-DEXTROSE 3.2-5 MG/ML-% IV SOLN
0.0000 ug/kg/min | INTRAVENOUS | Status: DC
  Filled 2018-03-03: qty 250

## 2018-03-03 MED ORDER — PLASMA-LYTE 148 IV SOLN
INTRAVENOUS | Status: DC
  Filled 2018-03-03: qty 2.5

## 2018-03-04 ENCOUNTER — Encounter (HOSPITAL_COMMUNITY)
Admission: RE | Disposition: A | Discharge status: Home / Self Care | Payer: Self-pay | Source: Ambulatory Visit | Attending: Thoracic Surgery (Cardiothoracic Vascular Surgery)

## 2018-03-04 ENCOUNTER — Inpatient Hospital Stay (HOSPITAL_COMMUNITY)
Admission: RE | Admit: 2018-03-04 | Discharge: 2018-03-10 | DRG: 220 | Disposition: A | Discharge status: Home / Self Care | Payer: Medicare Other | Source: Ambulatory Visit | Attending: Thoracic Surgery (Cardiothoracic Vascular Surgery) | Admitting: Thoracic Surgery (Cardiothoracic Vascular Surgery)

## 2018-03-04 ENCOUNTER — Inpatient Hospital Stay (HOSPITAL_COMMUNITY): Payer: Medicare Other | Admitting: Certified Registered Nurse Anesthetist

## 2018-03-04 ENCOUNTER — Inpatient Hospital Stay (HOSPITAL_COMMUNITY): Payer: Medicare Other

## 2018-03-04 ENCOUNTER — Inpatient Hospital Stay (HOSPITAL_COMMUNITY): Payer: Medicare Other | Admitting: Emergency Medicine

## 2018-03-04 ENCOUNTER — Encounter (HOSPITAL_COMMUNITY): Payer: Self-pay | Admitting: *Deleted

## 2018-03-04 DIAGNOSIS — E119 Type 2 diabetes mellitus without complications: Secondary | ICD-10-CM | POA: Diagnosis present

## 2018-03-04 DIAGNOSIS — Z7982 Long term (current) use of aspirin: Secondary | ICD-10-CM | POA: Diagnosis not present

## 2018-03-04 DIAGNOSIS — I472 Ventricular tachycardia: Secondary | ICD-10-CM | POA: Diagnosis not present

## 2018-03-04 DIAGNOSIS — Z79899 Other long term (current) drug therapy: Secondary | ICD-10-CM | POA: Diagnosis not present

## 2018-03-04 DIAGNOSIS — I471 Supraventricular tachycardia: Secondary | ICD-10-CM | POA: Diagnosis not present

## 2018-03-04 DIAGNOSIS — I351 Nonrheumatic aortic (valve) insufficiency: Secondary | ICD-10-CM | POA: Diagnosis not present

## 2018-03-04 DIAGNOSIS — Z8249 Family history of ischemic heart disease and other diseases of the circulatory system: Secondary | ICD-10-CM

## 2018-03-04 DIAGNOSIS — Z820 Family history of epilepsy and other diseases of the nervous system: Secondary | ICD-10-CM

## 2018-03-04 DIAGNOSIS — Z808 Family history of malignant neoplasm of other organs or systems: Secondary | ICD-10-CM | POA: Diagnosis not present

## 2018-03-04 DIAGNOSIS — D62 Acute posthemorrhagic anemia: Secondary | ICD-10-CM | POA: Diagnosis not present

## 2018-03-04 DIAGNOSIS — Z86010 Personal history of colonic polyps: Secondary | ICD-10-CM

## 2018-03-04 DIAGNOSIS — Z833 Family history of diabetes mellitus: Secondary | ICD-10-CM

## 2018-03-04 DIAGNOSIS — Z794 Long term (current) use of insulin: Secondary | ICD-10-CM | POA: Diagnosis not present

## 2018-03-04 DIAGNOSIS — Z87891 Personal history of nicotine dependence: Secondary | ICD-10-CM | POA: Diagnosis not present

## 2018-03-04 DIAGNOSIS — I4891 Unspecified atrial fibrillation: Secondary | ICD-10-CM | POA: Diagnosis present

## 2018-03-04 DIAGNOSIS — I97190 Other postprocedural cardiac functional disturbances following cardiac surgery: Secondary | ICD-10-CM | POA: Diagnosis not present

## 2018-03-04 DIAGNOSIS — J9811 Atelectasis: Secondary | ICD-10-CM | POA: Diagnosis not present

## 2018-03-04 DIAGNOSIS — Z951 Presence of aortocoronary bypass graft: Secondary | ICD-10-CM

## 2018-03-04 DIAGNOSIS — J90 Pleural effusion, not elsewhere classified: Secondary | ICD-10-CM | POA: Diagnosis present

## 2018-03-04 DIAGNOSIS — I2510 Atherosclerotic heart disease of native coronary artery without angina pectoris: Secondary | ICD-10-CM | POA: Diagnosis not present

## 2018-03-04 DIAGNOSIS — Z952 Presence of prosthetic heart valve: Secondary | ICD-10-CM

## 2018-03-04 DIAGNOSIS — I447 Left bundle-branch block, unspecified: Secondary | ICD-10-CM | POA: Diagnosis present

## 2018-03-04 DIAGNOSIS — Z09 Encounter for follow-up examination after completed treatment for conditions other than malignant neoplasm: Secondary | ICD-10-CM

## 2018-03-04 DIAGNOSIS — Z888 Allergy status to other drugs, medicaments and biological substances status: Secondary | ICD-10-CM | POA: Diagnosis not present

## 2018-03-04 DIAGNOSIS — E785 Hyperlipidemia, unspecified: Secondary | ICD-10-CM | POA: Diagnosis present

## 2018-03-04 DIAGNOSIS — I358 Other nonrheumatic aortic valve disorders: Secondary | ICD-10-CM | POA: Diagnosis not present

## 2018-03-04 DIAGNOSIS — I10 Essential (primary) hypertension: Secondary | ICD-10-CM | POA: Diagnosis not present

## 2018-03-04 DIAGNOSIS — I350 Nonrheumatic aortic (valve) stenosis: Principal | ICD-10-CM | POA: Diagnosis present

## 2018-03-04 DIAGNOSIS — I082 Rheumatic disorders of both aortic and tricuspid valves: Secondary | ICD-10-CM | POA: Diagnosis not present

## 2018-03-04 DIAGNOSIS — I480 Paroxysmal atrial fibrillation: Secondary | ICD-10-CM | POA: Diagnosis not present

## 2018-03-04 HISTORY — PX: TEE WITHOUT CARDIOVERSION: SHX5443

## 2018-03-04 HISTORY — DX: Presence of prosthetic heart valve: Z95.2

## 2018-03-04 HISTORY — DX: Cardiac murmur, unspecified: R01.1

## 2018-03-04 HISTORY — PX: AORTIC VALVE REPLACEMENT: SHX41

## 2018-03-04 HISTORY — DX: Type 2 diabetes mellitus without complications: E11.9

## 2018-03-04 HISTORY — PX: CORONARY ARTERY BYPASS GRAFT: SHX141

## 2018-03-04 LAB — POCT I-STAT 3, ART BLOOD GAS (G3+)
ACID-BASE DEFICIT: 4 mmol/L — AB (ref 0.0–2.0)
Acid-base deficit: 3 mmol/L — ABNORMAL HIGH (ref 0.0–2.0)
Acid-base deficit: 3 mmol/L — ABNORMAL HIGH (ref 0.0–2.0)
Acid-base deficit: 2 mmol/L (ref 0.0–2.0)
BICARBONATE: 22.3 mmol/L (ref 20.0–28.0)
BICARBONATE: 23.4 mmol/L (ref 20.0–28.0)
BICARBONATE: 23.9 mmol/L (ref 20.0–28.0)
Bicarbonate: 23.9 mmol/L (ref 20.0–28.0)
O2 SAT: 92 %
O2 Saturation: 96 %
O2 Saturation: 93 %
O2 Saturation: 95 %
PCO2 ART: 46.7 mmHg (ref 32.0–48.0)
PCO2 ART: 45.8 mmHg (ref 32.0–48.0)
PCO2 ART: 46.9 mmHg (ref 32.0–48.0)
PH ART: 7.315 — AB (ref 7.350–7.450)
PH ART: 7.317 — AB (ref 7.350–7.450)
PO2 ART: 81 mmHg — AB (ref 83.0–108.0)
PO2 ART: 69 mmHg — AB (ref 83.0–108.0)
Patient temperature: 35.4
Patient temperature: 37.1
Patient temperature: 37.1
TCO2: 24 mmol/L (ref 22–32)
TCO2: 25 mmol/L (ref 22–32)
TCO2: 25 mmol/L (ref 22–32)
TCO2: 25 mmol/L (ref 22–32)
pCO2 arterial: 44 mmHg (ref 32.0–48.0)
pH, Arterial: 7.304 — ABNORMAL LOW (ref 7.350–7.450)
pH, Arterial: 7.315 — ABNORMAL LOW (ref 7.350–7.450)
pO2, Arterial: 81 mmHg — ABNORMAL LOW (ref 83.0–108.0)
pO2, Arterial: 71 mmHg — ABNORMAL LOW (ref 83.0–108.0)

## 2018-03-04 LAB — GLUCOSE, CAPILLARY
GLUCOSE-CAPILLARY: 130 mg/dL — AB (ref 70–99)
GLUCOSE-CAPILLARY: 124 mg/dL — AB (ref 70–99)
GLUCOSE-CAPILLARY: 126 mg/dL — AB (ref 70–99)
GLUCOSE-CAPILLARY: 127 mg/dL — AB (ref 70–99)
Glucose-Capillary: 154 mg/dL — ABNORMAL HIGH (ref 70–99)
Glucose-Capillary: 141 mg/dL — ABNORMAL HIGH (ref 70–99)
Glucose-Capillary: 111 mg/dL — ABNORMAL HIGH (ref 70–99)

## 2018-03-04 LAB — HEMOGLOBIN AND HEMATOCRIT, BLOOD
HCT: 35.4 % — ABNORMAL LOW (ref 39.0–52.0)
Hemoglobin: 10.7 g/dL — ABNORMAL LOW (ref 13.0–17.0)

## 2018-03-04 LAB — CBC
HCT: 38 % — ABNORMAL LOW (ref 39.0–52.0)
HEMATOCRIT: 36 % — AB (ref 39.0–52.0)
HEMOGLOBIN: 11.5 g/dL — AB (ref 13.0–17.0)
HEMOGLOBIN: 10.9 g/dL — AB (ref 13.0–17.0)
MCH: 23.1 pg — AB (ref 26.0–34.0)
MCH: 23.1 pg — AB (ref 26.0–34.0)
MCHC: 30.3 g/dL (ref 30.0–36.0)
MCHC: 30.3 g/dL (ref 30.0–36.0)
MCV: 76.3 fL — AB (ref 78.0–100.0)
MCV: 76.4 fL — ABNORMAL LOW (ref 78.0–100.0)
Platelets: 234 10*3/uL (ref 150–400)
Platelets: 221 10*3/uL (ref 150–400)
RBC: 4.98 MIL/uL (ref 4.22–5.81)
RBC: 4.71 MIL/uL (ref 4.22–5.81)
RDW: 15.2 % (ref 11.5–15.5)
RDW: 15.2 % (ref 11.5–15.5)
WBC: 13 10*3/uL — ABNORMAL HIGH (ref 4.0–10.5)
WBC: 10 10*3/uL (ref 4.0–10.5)

## 2018-03-04 LAB — POCT I-STAT, CHEM 8
BUN: 10 mg/dL (ref 8–23)
CHLORIDE: 111 mmol/L (ref 98–111)
Calcium, Ion: 1.25 mmol/L (ref 1.15–1.40)
Creatinine, Ser: 0.7 mg/dL (ref 0.61–1.24)
Glucose, Bld: 122 mg/dL — ABNORMAL HIGH (ref 70–99)
HEMATOCRIT: 36 % — AB (ref 39.0–52.0)
HEMOGLOBIN: 12.2 g/dL — AB (ref 13.0–17.0)
POTASSIUM: 4.3 mmol/L (ref 3.5–5.1)
Sodium: 148 mmol/L — ABNORMAL HIGH (ref 135–145)
TCO2: 24 mmol/L (ref 22–32)

## 2018-03-04 LAB — POCT I-STAT 4, (NA,K, GLUC, HGB,HCT)
Glucose, Bld: 122 mg/dL — ABNORMAL HIGH (ref 70–99)
HEMATOCRIT: 36 % — AB (ref 39.0–52.0)
HEMOGLOBIN: 12.2 g/dL — AB (ref 13.0–17.0)
Potassium: 4.7 mmol/L (ref 3.5–5.1)
SODIUM: 146 mmol/L — AB (ref 135–145)

## 2018-03-04 LAB — CREATININE, SERUM
Creatinine, Ser: 0.82 mg/dL (ref 0.61–1.24)
GFR calc Af Amer: >60 mL/min (ref >60)
GFR calc non Af Amer: >60 mL/min (ref >60)

## 2018-03-04 LAB — PLATELET COUNT: Platelets: 286 10*3/uL (ref 150–400)

## 2018-03-04 LAB — APTT: aPTT: 41 seconds — ABNORMAL HIGH (ref 24–36)

## 2018-03-04 LAB — MAGNESIUM: Magnesium: 3.3 mg/dL — ABNORMAL HIGH (ref 1.7–2.4)

## 2018-03-04 LAB — PROTIME-INR
INR: 1.73
Prothrombin Time: 20.1 seconds — ABNORMAL HIGH (ref 11.4–15.2)

## 2018-03-04 SURGERY — REPLACEMENT, AORTIC VALVE, OPEN
Anesthesia: General | Site: Chest

## 2018-03-04 MED ORDER — CHLORHEXIDINE GLUCONATE 0.12 % MT SOLN
15.0000 mL | Freq: Once | OROMUCOSAL | Status: AC
  Administered 2018-03-04: 15 mL via OROMUCOSAL
  Filled 2018-03-04: qty 15

## 2018-03-04 MED ORDER — LACTATED RINGERS IV SOLN
INTRAVENOUS | Status: DC
  Administered 2018-03-04: 14:00:00 via INTRAVENOUS

## 2018-03-04 MED ORDER — MAGNESIUM SULFATE 4 GM/100ML IV SOLN
4.0000 g | Freq: Once | INTRAVENOUS | Status: AC
  Administered 2018-03-04: 4 g via INTRAVENOUS
  Filled 2018-03-04: qty 100

## 2018-03-04 MED ORDER — NITROGLYCERIN IN D5W 200-5 MCG/ML-% IV SOLN: 0.0000 ug/min | INTRAVENOUS | Status: DC

## 2018-03-04 MED ORDER — CHLORHEXIDINE GLUCONATE 4 % EX LIQD: 30.0000 mL | CUTANEOUS | Status: DC

## 2018-03-04 MED ORDER — INSULIN REGULAR BOLUS VIA INFUSION
0.0000 [IU] | Freq: Three times a day (TID) | INTRAVENOUS | Status: DC
  Filled 2018-03-04: qty 10

## 2018-03-04 MED ORDER — SODIUM CHLORIDE 0.9% FLUSH: 3.0000 mL | INTRAVENOUS | Status: DC | PRN

## 2018-03-04 MED ORDER — ALBUMIN HUMAN 5 % IV SOLN
250.0000 mL | INTRAVENOUS | Status: AC | PRN
  Administered 2018-03-04 (×4): 250 mL via INTRAVENOUS
  Filled 2018-03-04 (×2): qty 250

## 2018-03-04 MED ORDER — PHENYLEPHRINE 40 MCG/ML (10ML) SYRINGE FOR IV PUSH (FOR BLOOD PRESSURE SUPPORT)
PREFILLED_SYRINGE | INTRAVENOUS | Status: AC
  Filled 2018-03-04: qty 10

## 2018-03-04 MED ORDER — 0.9 % SODIUM CHLORIDE (POUR BTL) OPTIME
TOPICAL | Status: DC | PRN
  Administered 2018-03-04: 6000 mL

## 2018-03-04 MED ORDER — SODIUM CHLORIDE 0.9 % IR SOLN: Status: DC | PRN

## 2018-03-04 MED ORDER — PLASMA-LYTE 148 IV SOLN
INTRAVENOUS | Status: DC | PRN
  Administered 2018-03-04: 500 mL

## 2018-03-04 MED ORDER — SODIUM CHLORIDE 0.9% FLUSH
3.0000 mL | Freq: Two times a day (BID) | INTRAVENOUS | Status: DC
  Administered 2018-03-05 – 2018-03-06 (×3): 3 mL via INTRAVENOUS

## 2018-03-04 MED ORDER — ROCURONIUM BROMIDE 10 MG/ML (PF) SYRINGE
PREFILLED_SYRINGE | INTRAVENOUS | Status: AC
  Filled 2018-03-04: qty 20

## 2018-03-04 MED ORDER — HEPARIN SODIUM (PORCINE) 1000 UNIT/ML IJ SOLN
INTRAMUSCULAR | Status: AC
  Filled 2018-03-04: qty 1

## 2018-03-04 MED ORDER — METOPROLOL TARTRATE 25 MG/10 ML ORAL SUSPENSION: 12.5000 mg | Freq: Two times a day (BID) | ORAL | Status: DC

## 2018-03-04 MED ORDER — PHENYLEPHRINE HCL-NACL 20-0.9 MG/250ML-% IV SOLN
0.0000 ug/min | INTRAVENOUS | Status: DC
  Administered 2018-03-04: 25 ug/min via INTRAVENOUS
  Filled 2018-03-04 (×3): qty 250

## 2018-03-04 MED ORDER — METOPROLOL TARTRATE 12.5 MG HALF TABLET
12.5000 mg | ORAL_TABLET | Freq: Once | ORAL | Status: AC
  Administered 2018-03-04: 12.5 mg via ORAL
  Filled 2018-03-04: qty 1

## 2018-03-04 MED ORDER — SODIUM CHLORIDE 0.9 % IV SOLN: 250.0000 mL | INTRAVENOUS | Status: DC

## 2018-03-04 MED ORDER — MIDAZOLAM HCL 2 MG/2ML IJ SOLN: 2.0000 mg | INTRAMUSCULAR | Status: DC | PRN

## 2018-03-04 MED ORDER — PROTAMINE SULFATE 10 MG/ML IV SOLN
INTRAVENOUS | Status: AC
  Filled 2018-03-04: qty 5

## 2018-03-04 MED ORDER — DOCUSATE SODIUM 100 MG PO CAPS
200.0000 mg | ORAL_CAPSULE | Freq: Every day | ORAL | Status: DC
  Administered 2018-03-05 – 2018-03-10 (×5): 200 mg via ORAL
  Filled 2018-03-04 (×5): qty 2

## 2018-03-04 MED ORDER — ASPIRIN EC 325 MG PO TBEC
325.0000 mg | DELAYED_RELEASE_TABLET | Freq: Every day | ORAL | Status: DC
  Administered 2018-03-05 – 2018-03-10 (×5): 325 mg via ORAL
  Filled 2018-03-04 (×5): qty 1

## 2018-03-04 MED ORDER — ACETAMINOPHEN 160 MG/5ML PO SOLN: 650.0000 mg | Freq: Once | ORAL | Status: AC

## 2018-03-04 MED ORDER — HEPARIN SODIUM (PORCINE) 1000 UNIT/ML IJ SOLN
INTRAMUSCULAR | Status: DC | PRN
  Administered 2018-03-04: 29000 [IU] via INTRAVENOUS

## 2018-03-04 MED ORDER — HEMOSTATIC AGENTS (NO CHARGE) OPTIME
TOPICAL | Status: DC | PRN
  Administered 2018-03-04: 1
  Administered 2018-03-04: 2

## 2018-03-04 MED ORDER — TRAMADOL HCL 50 MG PO TABS: 50.0000 mg | ORAL_TABLET | ORAL | Status: DC | PRN

## 2018-03-04 MED ORDER — LACTATED RINGERS IV SOLN: 500.0000 mL | Freq: Once | INTRAVENOUS | Status: DC | PRN

## 2018-03-04 MED ORDER — BISACODYL 10 MG RE SUPP: 10.0000 mg | Freq: Every day | RECTAL | Status: DC

## 2018-03-04 MED ORDER — SODIUM CHLORIDE 0.9 % IV SOLN
INTRAVENOUS | Status: DC | PRN
  Administered 2018-03-04: 13:00:00 via INTRAVENOUS

## 2018-03-04 MED ORDER — FENTANYL CITRATE (PF) 250 MCG/5ML IJ SOLN
INTRAMUSCULAR | Status: DC | PRN
  Administered 2018-03-04: 100 ug via INTRAVENOUS
  Administered 2018-03-04: 50 ug via INTRAVENOUS
  Administered 2018-03-04: 1000 ug via INTRAVENOUS

## 2018-03-04 MED ORDER — LACTATED RINGERS IV SOLN
INTRAVENOUS | Status: DC | PRN
  Administered 2018-03-04 (×3): via INTRAVENOUS

## 2018-03-04 MED ORDER — METOPROLOL TARTRATE 12.5 MG HALF TABLET
12.5000 mg | ORAL_TABLET | Freq: Two times a day (BID) | ORAL | Status: DC
  Administered 2018-03-05 – 2018-03-07 (×5): 12.5 mg via ORAL
  Filled 2018-03-04 (×5): qty 1

## 2018-03-04 MED ORDER — SODIUM BICARBONATE 8.4 % IV SOLN
50.0000 meq | Freq: Once | INTRAVENOUS | Status: AC
  Administered 2018-03-04: 50 meq via INTRAVENOUS

## 2018-03-04 MED ORDER — MIDAZOLAM HCL 10 MG/2ML IJ SOLN
INTRAMUSCULAR | Status: AC
  Filled 2018-03-04: qty 2

## 2018-03-04 MED ORDER — ASPIRIN 81 MG PO CHEW
324.0000 mg | CHEWABLE_TABLET | Freq: Every day | ORAL | Status: DC
  Administered 2018-03-09: 324 mg
  Filled 2018-03-04: qty 4

## 2018-03-04 MED ORDER — SUCCINYLCHOLINE CHLORIDE 200 MG/10ML IV SOSY
PREFILLED_SYRINGE | INTRAVENOUS | Status: AC
  Filled 2018-03-04: qty 10

## 2018-03-04 MED ORDER — CALCIUM CHLORIDE 10 % IV SOLN
1.0000 g | Freq: Once | INTRAVENOUS | Status: AC
  Administered 2018-03-04: 1 g via INTRAVENOUS

## 2018-03-04 MED ORDER — SODIUM CHLORIDE 0.45 % IV SOLN
INTRAVENOUS | Status: DC | PRN
  Administered 2018-03-04: 14:00:00 via INTRAVENOUS

## 2018-03-04 MED ORDER — PROTAMINE SULFATE 10 MG/ML IV SOLN
INTRAVENOUS | Status: DC | PRN
  Administered 2018-03-04: 290 mg via INTRAVENOUS

## 2018-03-04 MED ORDER — ROCURONIUM BROMIDE 10 MG/ML (PF) SYRINGE
PREFILLED_SYRINGE | INTRAVENOUS | Status: DC | PRN
  Administered 2018-03-04 (×2): 50 mg via INTRAVENOUS
  Administered 2018-03-04: 30 mg via INTRAVENOUS
  Administered 2018-03-04: 20 mg via INTRAVENOUS

## 2018-03-04 MED ORDER — LIDOCAINE 2% (20 MG/ML) 5 ML SYRINGE
INTRAMUSCULAR | Status: AC
  Filled 2018-03-04: qty 5

## 2018-03-04 MED ORDER — MORPHINE SULFATE (PF) 2 MG/ML IV SOLN
2.0000 mg | INTRAVENOUS | Status: DC | PRN
  Administered 2018-03-04 – 2018-03-05 (×2): 2 mg via INTRAVENOUS
  Filled 2018-03-04 (×2): qty 1

## 2018-03-04 MED ORDER — BISACODYL 5 MG PO TBEC
10.0000 mg | DELAYED_RELEASE_TABLET | Freq: Every day | ORAL | Status: DC
  Administered 2018-03-05 – 2018-03-08 (×3): 10 mg via ORAL
  Filled 2018-03-04 (×3): qty 2

## 2018-03-04 MED ORDER — ACETAMINOPHEN 160 MG/5ML PO SOLN: 1000.0000 mg | Freq: Four times a day (QID) | ORAL | Status: AC

## 2018-03-04 MED ORDER — PROPOFOL 10 MG/ML IV BOLUS
INTRAVENOUS | Status: AC
  Filled 2018-03-04: qty 20

## 2018-03-04 MED ORDER — OXYCODONE HCL 5 MG PO TABS
5.0000 mg | ORAL_TABLET | ORAL | Status: DC | PRN
  Administered 2018-03-04 – 2018-03-06 (×5): 10 mg via ORAL
  Administered 2018-03-06: 5 mg via ORAL
  Filled 2018-03-04 (×3): qty 2
  Filled 2018-03-04: qty 1
  Filled 2018-03-04 (×2): qty 2

## 2018-03-04 MED ORDER — CEFUROXIME SODIUM 1.5 G IV SOLR
1.5000 g | Freq: Two times a day (BID) | INTRAVENOUS | Status: AC
  Administered 2018-03-04 – 2018-03-06 (×4): 1.5 g via INTRAVENOUS
  Filled 2018-03-04 (×4): qty 1.5

## 2018-03-04 MED ORDER — ACETAMINOPHEN 650 MG RE SUPP
650.0000 mg | Freq: Once | RECTAL | Status: AC
  Administered 2018-03-04: 650 mg via RECTAL

## 2018-03-04 MED ORDER — VANCOMYCIN HCL IN DEXTROSE 1-5 GM/200ML-% IV SOLN
1000.0000 mg | Freq: Once | INTRAVENOUS | Status: AC
  Administered 2018-03-04: 1000 mg via INTRAVENOUS
  Filled 2018-03-04: qty 200

## 2018-03-04 MED ORDER — PROPOFOL 10 MG/ML IV BOLUS
INTRAVENOUS | Status: DC | PRN
  Administered 2018-03-04: 35 mg via INTRAVENOUS

## 2018-03-04 MED ORDER — ROCURONIUM BROMIDE 10 MG/ML (PF) SYRINGE
PREFILLED_SYRINGE | INTRAVENOUS | Status: AC
  Filled 2018-03-04: qty 10

## 2018-03-04 MED ORDER — MIDAZOLAM HCL 5 MG/5ML IJ SOLN
INTRAMUSCULAR | Status: DC | PRN
  Administered 2018-03-04 (×3): 2 mg via INTRAVENOUS

## 2018-03-04 MED ORDER — DOPAMINE-DEXTROSE 3.2-5 MG/ML-% IV SOLN
3.0000 ug/kg/min | INTRAVENOUS | Status: DC
  Filled 2018-03-04 (×2): qty 250

## 2018-03-04 MED ORDER — PROTAMINE SULFATE 10 MG/ML IV SOLN
INTRAVENOUS | Status: AC
  Filled 2018-03-04: qty 25

## 2018-03-04 MED ORDER — POTASSIUM CHLORIDE 10 MEQ/50ML IV SOLN: 10.0000 meq | INTRAVENOUS | Status: AC

## 2018-03-04 MED ORDER — CHLORHEXIDINE GLUCONATE 0.12 % MT SOLN
15.0000 mL | OROMUCOSAL | Status: AC
  Administered 2018-03-04: 15 mL via OROMUCOSAL

## 2018-03-04 MED ORDER — METOPROLOL TARTRATE 5 MG/5ML IV SOLN: 2.5000 mg | INTRAVENOUS | Status: DC | PRN

## 2018-03-04 MED ORDER — LACTATED RINGERS IV SOLN: INTRAVENOUS | Status: DC

## 2018-03-04 MED ORDER — PANTOPRAZOLE SODIUM 40 MG PO TBEC
40.0000 mg | DELAYED_RELEASE_TABLET | Freq: Every day | ORAL | Status: DC
  Administered 2018-03-06 – 2018-03-10 (×5): 40 mg via ORAL
  Filled 2018-03-04 (×5): qty 1

## 2018-03-04 MED ORDER — ALBUMIN HUMAN 5 % IV SOLN
INTRAVENOUS | Status: DC | PRN
  Administered 2018-03-04 (×2): via INTRAVENOUS

## 2018-03-04 MED ORDER — FENTANYL CITRATE (PF) 250 MCG/5ML IJ SOLN
INTRAMUSCULAR | Status: AC
  Filled 2018-03-04: qty 30

## 2018-03-04 MED ORDER — FAMOTIDINE IN NACL 20-0.9 MG/50ML-% IV SOLN
20.0000 mg | Freq: Two times a day (BID) | INTRAVENOUS | Status: AC
  Administered 2018-03-04 (×2): 20 mg via INTRAVENOUS
  Filled 2018-03-04: qty 50

## 2018-03-04 MED ORDER — ONDANSETRON HCL 4 MG/2ML IJ SOLN: 4.0000 mg | Freq: Four times a day (QID) | INTRAMUSCULAR | Status: DC | PRN

## 2018-03-04 MED ORDER — MORPHINE SULFATE (PF) 2 MG/ML IV SOLN: 1.0000 mg | INTRAVENOUS | Status: AC | PRN

## 2018-03-04 MED ORDER — SODIUM CHLORIDE 0.9 % IV SOLN
INTRAVENOUS | Status: DC
  Administered 2018-03-04 – 2018-03-05 (×3): via INTRAVENOUS

## 2018-03-04 MED ORDER — ACETAMINOPHEN 500 MG PO TABS
1000.0000 mg | ORAL_TABLET | Freq: Four times a day (QID) | ORAL | Status: AC
  Administered 2018-03-04 – 2018-03-09 (×18): 1000 mg via ORAL
  Filled 2018-03-04 (×17): qty 2

## 2018-03-04 MED ORDER — ALBUMIN HUMAN 5 % IV SOLN
INTRAVENOUS | Status: AC
  Filled 2018-03-04: qty 250

## 2018-03-04 MED ORDER — ALBUMIN HUMAN 5 % IV SOLN
12.5000 g | Freq: Once | INTRAVENOUS | Status: AC
  Administered 2018-03-04: 12.5 g via INTRAVENOUS

## 2018-03-04 MED ORDER — DEXMEDETOMIDINE HCL IN NACL 200 MCG/50ML IV SOLN
0.0000 ug/kg/h | INTRAVENOUS | Status: DC
  Filled 2018-03-04: qty 50

## 2018-03-04 MED ORDER — SODIUM CHLORIDE 0.9 % IV SOLN
INTRAVENOUS | Status: DC
  Filled 2018-03-04: qty 1

## 2018-03-04 SURGICAL SUPPLY — 110 items
ADAPTER CARDIO PERF ANTE/RETRO (ADAPTER) ×4 IMPLANT
ADH SRG 12 PREFL SYR 3 SPRDR (MISCELLANEOUS)
ADPR PRFSN 84XANTGRD RTRGD (ADAPTER) ×2
APL SWBSTK 6 STRL LF DISP (MISCELLANEOUS) ×2
APPLICATOR COTTON TIP 6 STRL (MISCELLANEOUS) IMPLANT
APPLICATOR COTTON TIP 6IN STRL (MISCELLANEOUS) ×4 IMPLANT
BAG DECANTER FOR FLEXI CONT (MISCELLANEOUS) ×4 IMPLANT
BANDAGE ACE 4X5 VEL STRL LF (GAUZE/BANDAGES/DRESSINGS) ×4 IMPLANT
BANDAGE ACE 6X5 VEL STRL LF (GAUZE/BANDAGES/DRESSINGS) ×4 IMPLANT
BASKET HEART  (ORDER IN 25'S) (MISCELLANEOUS) ×1
BASKET HEART (ORDER IN 25'S) (MISCELLANEOUS) ×1
BASKET HEART (ORDER IN 25S) (MISCELLANEOUS) ×2 IMPLANT
BLADE STERNUM SYSTEM 6 (BLADE) ×4 IMPLANT
BLADE SURG 15 STRL LF DISP TIS (BLADE) ×2 IMPLANT
BLADE SURG 15 STRL SS (BLADE) ×4
BNDG GAUZE ELAST 4 BULKY (GAUZE/BANDAGES/DRESSINGS) ×4 IMPLANT
CANISTER SUCT 3000ML PPV (MISCELLANEOUS) ×4 IMPLANT
CANNULA EZ GLIDE AORTIC 21FR (CANNULA) ×4 IMPLANT
CANNULA GUNDRY RCSP 15FR (MISCELLANEOUS) ×4 IMPLANT
CATH CPB KIT HENDRICKSON (MISCELLANEOUS) ×4 IMPLANT
CATH HEART VENT LEFT (CATHETERS) ×2 IMPLANT
CATH ROBINSON RED A/P 18FR (CATHETERS) ×8 IMPLANT
CATH THORACIC 36FR (CATHETERS) ×4 IMPLANT
CATH THORACIC 36FR RT ANG (CATHETERS) ×8 IMPLANT
CLIP FOGARTY SPRING 6M (CLIP) IMPLANT
CLIP VESOCCLUDE MED 24/CT (CLIP) IMPLANT
CLIP VESOCCLUDE SM WIDE 24/CT (CLIP) ×2 IMPLANT
CONT SPEC 4OZ CLIKSEAL STRL BL (MISCELLANEOUS) ×2 IMPLANT
CRADLE DONUT ADULT HEAD (MISCELLANEOUS) ×4 IMPLANT
DRAPE CARDIOVASCULAR INCISE (DRAPES) ×4
DRAPE SLUSH/WARMER DISC (DRAPES) ×4 IMPLANT
DRAPE SRG 135X102X78XABS (DRAPES) ×2 IMPLANT
DRSG AQUACEL AG ADV 3.5X14 (GAUZE/BANDAGES/DRESSINGS) ×2 IMPLANT
DRSG COVADERM 4X14 (GAUZE/BANDAGES/DRESSINGS) ×4 IMPLANT
ELECT REM PT RETURN 9FT ADLT (ELECTROSURGICAL) ×8
ELECTRODE REM PT RTRN 9FT ADLT (ELECTROSURGICAL) ×4 IMPLANT
FELT TEFLON 1X6 (MISCELLANEOUS) ×8 IMPLANT
GAUZE SPONGE 4X4 12PLY STRL (GAUZE/BANDAGES/DRESSINGS) ×6 IMPLANT
GLOVE BIOGEL PI IND STRL 8 (GLOVE) IMPLANT
GLOVE BIOGEL PI INDICATOR 8 (GLOVE) ×6
GLOVE ECLIPSE 8.0 STRL XLNG CF (GLOVE) ×4 IMPLANT
GLOVE SURG SIGNA 7.5 PF LTX (GLOVE) ×12 IMPLANT
GOWN STRL REUS W/ TWL LRG LVL3 (GOWN DISPOSABLE) ×8 IMPLANT
GOWN STRL REUS W/ TWL XL LVL3 (GOWN DISPOSABLE) ×4 IMPLANT
GOWN STRL REUS W/TWL 2XL LVL3 (GOWN DISPOSABLE) ×2 IMPLANT
GOWN STRL REUS W/TWL LRG LVL3 (GOWN DISPOSABLE) ×16
GOWN STRL REUS W/TWL XL LVL3 (GOWN DISPOSABLE) ×8
HEMOSTAT ARISTA ABSORB 3G PWDR (MISCELLANEOUS) ×4 IMPLANT
HEMOSTAT POWDER SURGIFOAM 1G (HEMOSTASIS) ×6 IMPLANT
HEMOSTAT SURGICEL 2X14 (HEMOSTASIS) ×4 IMPLANT
INSERT FOGARTY XLG (MISCELLANEOUS) IMPLANT
KIT BASIN OR (CUSTOM PROCEDURE TRAY) ×4 IMPLANT
KIT SUCTION CATH 14FR (SUCTIONS) ×8 IMPLANT
KIT TURNOVER KIT B (KITS) ×4 IMPLANT
KIT VASOVIEW HEMOPRO VH 3000 (KITS) ×2 IMPLANT
LINE VENT (MISCELLANEOUS) ×2 IMPLANT
MARKER GRAFT CORONARY BYPASS (MISCELLANEOUS) ×12 IMPLANT
NS IRRIG 1000ML POUR BTL (IV SOLUTION) ×20 IMPLANT
PACK E OPEN HEART (SUTURE) ×4 IMPLANT
PACK OPEN HEART (CUSTOM PROCEDURE TRAY) ×4 IMPLANT
PAD ARMBOARD 7.5X6 YLW CONV (MISCELLANEOUS) ×8 IMPLANT
PAD ELECT DEFIB RADIOL ZOLL (MISCELLANEOUS) ×4 IMPLANT
PENCIL BUTTON HOLSTER BLD 10FT (ELECTRODE) ×4 IMPLANT
PUNCH AORTIC ROTATE 4.0MM (MISCELLANEOUS) IMPLANT
PUNCH AORTIC ROTATE 4.5MM 8IN (MISCELLANEOUS) IMPLANT
PUNCH AORTIC ROTATE 5MM 8IN (MISCELLANEOUS) IMPLANT
SET CARDIOPLEGIA MPS 5001102 (MISCELLANEOUS) ×2 IMPLANT
SUT BONE WAX W31G (SUTURE) ×4 IMPLANT
SUT ETHIBON 2 0 V 52N 30 (SUTURE) ×8 IMPLANT
SUT ETHIBON EXCEL 2-0 V-5 (SUTURE) IMPLANT
SUT ETHIBOND 2 0 SH (SUTURE) ×12
SUT ETHIBOND 2 0 SH 36X2 (SUTURE) ×2 IMPLANT
SUT ETHIBOND 2 0 V4 (SUTURE) IMPLANT
SUT ETHIBOND 2 0V4 GREEN (SUTURE) IMPLANT
SUT ETHIBOND 4 0 RB 1 (SUTURE) IMPLANT
SUT ETHIBOND V-5 VALVE (SUTURE) IMPLANT
SUT MNCRL AB 4-0 PS2 18 (SUTURE) IMPLANT
SUT PROLENE 3 0 SH 1 (SUTURE) IMPLANT
SUT PROLENE 3 0 SH DA (SUTURE) ×4 IMPLANT
SUT PROLENE 4 0 RB 1 (SUTURE) ×24
SUT PROLENE 4 0 SH DA (SUTURE) IMPLANT
SUT PROLENE 4-0 RB1 .5 CRCL 36 (SUTURE) ×4 IMPLANT
SUT PROLENE 6 0 C 1 30 (SUTURE) ×8 IMPLANT
SUT PROLENE 7 0 BV1 MDA (SUTURE) ×4 IMPLANT
SUT PROLENE 8 0 BV175 6 (SUTURE) IMPLANT
SUT SILK  1 MH (SUTURE) ×2
SUT SILK 1 MH (SUTURE) ×2 IMPLANT
SUT STEEL 6MS V (SUTURE) ×4 IMPLANT
SUT STEEL STERNAL CCS#1 18IN (SUTURE) IMPLANT
SUT STEEL SZ 6 DBL 3X14 BALL (SUTURE) ×4 IMPLANT
SUT VIC AB 1 CTX 36 (SUTURE) ×8
SUT VIC AB 1 CTX36XBRD ANBCTR (SUTURE) ×4 IMPLANT
SUT VIC AB 2-0 CT1 27 (SUTURE)
SUT VIC AB 2-0 CT1 TAPERPNT 27 (SUTURE) IMPLANT
SUT VIC AB 2-0 CTX 27 (SUTURE) IMPLANT
SUT VIC AB 3-0 SH 27 (SUTURE)
SUT VIC AB 3-0 SH 27X BRD (SUTURE) IMPLANT
SUT VIC AB 3-0 X1 27 (SUTURE) IMPLANT
SUT VICRYL 4-0 PS2 18IN ABS (SUTURE) IMPLANT
SYR 10ML KIT SKIN ADHESIVE (MISCELLANEOUS) IMPLANT
SYSTEM SAHARA CHEST DRAIN ATS (WOUND CARE) ×4 IMPLANT
TOWEL GREEN STERILE (TOWEL DISPOSABLE) ×4 IMPLANT
TOWEL GREEN STERILE FF (TOWEL DISPOSABLE) ×4 IMPLANT
TRAY FOLEY SLVR 16FR TEMP STAT (SET/KITS/TRAYS/PACK) ×4 IMPLANT
TUBE FEEDING 8FR 16IN STR KANG (MISCELLANEOUS) ×4 IMPLANT
TUBING INSUFFLATION (TUBING) ×4 IMPLANT
UNDERPAD 30X30 (UNDERPADS AND DIAPERS) ×4 IMPLANT
VALVE MAGNA EASE AORTIC 23MM (Prosthesis & Implant Heart) ×2 IMPLANT
VENT LEFT HEART 12002 (CATHETERS) ×8
WATER STERILE IRR 1000ML POUR (IV SOLUTION) ×8 IMPLANT

## 2018-03-04 NOTE — Anesthesia Procedure Notes (Signed)
Procedure Name: Intubation Date/Time: 03/04/2018 8:50 AM Performed by: Lowella Dell, CRNA Pre-anesthesia Checklist: Patient identified, Emergency Drugs available, Suction available and Patient being monitored Patient Re-evaluated:Patient Re-evaluated prior to induction Oxygen Delivery Method: Circle System Utilized Preoxygenation: Pre-oxygenation with 100% oxygen Induction Type: IV induction Ventilation: Mask ventilation without difficulty Laryngoscope Size: Mac and 4 Grade View: Grade I Tube type: Oral Tube size: 8.0 mm Number of attempts: 1 Airway Equipment and Method: Stylet Placement Confirmation: ETT inserted through vocal cords under direct vision,  positive ETCO2 and breath sounds checked- equal and bilateral Secured at: 22 cm Tube secured with: Tape Dental Injury: Teeth and Oropharynx as per pre-operative assessment

## 2018-03-04 NOTE — Op Note (Signed)
NAME: [name], [name] MEDICAL RECORD [id number] ACCOUNT [id number] DATE OF BIRTH:[birth year] FACILITY: [facility] LOCATION: [facility] PHYSICIAN:Fayette Hamada Chaya Jan, MD  OPERATIVE REPORT  DATE OF PROCEDURE:  03/04/2018  PREOPERATIVE DIAGNOSIS:  Severe aortic stenosis and single vessel coronary artery disease.  POSTOPERATIVE DIAGNOSIS:  Severe aortic stenosis and single vessel coronary artery disease.  PROCEDURES: 1.  Median sternotomy, extracorporeal circulation.   2.  Aortic valve replacement with 23 mm [facility] Ease bovine pericardial valve (serial [id number], model #3300 TFX). 3.  Coronary artery bypass grafting x1.  Left internal mammary artery to LAD.  SURGEON:   Revonda Standard. Roxan Hockey, MD  ASSISTANT:  Jadene Pierini, PA-C  ANESTHESIA:  General.  FINDINGS:  Transesophageal echocardiography revealed severe aortic stenosis and moderate aortic insufficiency, left ventricular hypertrophy.  Post-bypass showed good function of the prosthetic valve with a mean gradient of 7 mmHg.  No paravalvular leaks. EF ~ 40%.  Mammary artery and LAD, both good quality vessels.  CLINICAL NOTE:  The patient is a 67 year old man who was noted to have a heart murmur earlier this year.  He then had an episode of near syncope and was found to be in pulmonary edema.  An echocardiogram showed critical aortic stenosis with a mean  gradient of greater than 60 mmHg and a valve area of less than 0.5 cm2.  Cardiac catheterization showed single vessel coronary disease with a  65-70% proximal LAD stenosis.  Ejection fraction was approximately 35-40%.  He was advised to undergo aortic valve replacement and coronary artery bypass grafting.  The indications, risks, benefits, and alternatives were discussed in detail with the  patient.  He understood and accepted the risks and agreed to proceed.  We did discuss tissue versus mechanical valve.  His preference was for a tissue valve to avoid the need for lifelong  anticoagulation.  DESCRIPTION OF PROCEDURE:  The patient was brought to the preoperative holding area on 03/04/2018.  Dr. Lillia Abed of  Anesthesia placed a Swan-Ganz catheter and an arterial blood pressure monitoring line.  Of note, he had markedly elevated pulmonary  artery pressures of 65/40.  He was taken to the Operating Room, anesthetized and intubated.  Intravenous antibiotics were administered.  A Foley catheter was placed.  Transesophageal echocardiography was performed.  It showed some left ventricular  hypertrophy and mild global hypokinesis.  There was critical aortic stenosis and moderate aortic insufficiency.  The chest, abdomen and legs were prepped and draped in the usual sterile fashion.  Median sternotomy was performed.  The left internal mammary  artery was harvested under direct vision.  The patient was fully heparinized prior to dividing the distal end of the mammary artery.  The mammary had good flow when divided distally.  The pericardium was opened.  The ascending aorta was inspected.  It was of normal caliber.  There was no evidence of atherosclerotic disease.  After confirming adequate anticoagulation with ACT measurement the aorta was cannulated via concentric 2-0  Ethibond pledgeted pursestring sutures.  A dual stage venous cannula was placed via a pursestring suture in the right atrial appendage.  Cardiopulmonary bypass was initiated.  Flows were maintained per protocol.  The patient was cooled to 32 degrees Celsius.   The LAD was inspected and was a good quality vessel for grafting.  A left ventricular vent was placed via a pursestring suture in the right superior pulmonary vein.  A retrograde cardioplegia cannula was placed via a pursestring suture in the right atrium  and directed into the coronary  sinus.  An antegrade cardioplegia cannula was placed in the ascending aorta.  The aorta was cross clamped.  The left ventricle was emptied via the aortic root vent.  Cardiac  arrest was achieved with a combination of cold antegrade and retrograde blood cardioplegia and topical iced saline.  An initial 750 mL of cardioplegia was  administered antegrade.  There was septal cooling and a diastolic arrest.  An additional 750 mL of cardioplegia was administered retrograde per protocol.  There was septal cooling to 10 degrees Celsius.  The left internal mammary artery was brought through a window in the pericardium.  The distal end was bevelled.  It was anastomosed end-to-side to the distal LAD. Both the mammary and the LAD were 2 mm good quality vessels.  At the completion of the  anastomosis, the bulldog clamp was removed.  There was good hemostasis and septal rewarming was noted.  The bulldog clamp was replaced and the mammary pedicle was tacked to the epicardial surface of the heart with 6-0 Prolene sutures.  Additional  retrograde cardioplegia was administered.  An aortotomy was performed just above the sinotubular junction and directed into the noncoronary sinus.  The aortic valve was a heavily calcified, stenotic, bicuspid valve with complete fusion of the left and right cusps.  The coronary ostia arose in the  near 180 degree fashion.  The valve leaflets were excised, taking care to contain all calcific debris.  The annulus was debrided with a rongeur.  There was moderate annular calcification.  The annulus was copiously irrigated with iced saline.  Additional  retrograde cardioplegia was administered at 20 minute intervals during the valve replacement portion of the procedure.  The annulus sized for a 23 mm [facility] Ease bioprosthetic valve.  The valve was prepared per manufacture's recommendations.  Then 2-0 Ethibond horizontal mattress sutures with subannular pledgets were placed circumferentially around the annulus.  A  total of 17 sutures were used in all.  The sutures then were placed through the sewing ring of the valve.  The valve was lowered into place.  It  seated nicely.  The sutures were sequentially tied, periodically checking to ensure that the valve was well  seated.  After all the sutures had been placed, the annulus was probed with a fine-tipped right angle and there were no gaps noted.  The valve was well seated on the annulus.  There was no impingement on the left or right coronary ostia.  Rewarming was begun.  The aortotomy was closed in 2 layers of running 4-0 Prolene suture.  The first layer was a running horizontal mattress suture.  Initial de-airing was performed at the completion of this layer.  A second layer of a running simple  suture was placed.  With the left ventricular vent off and the aortic root vent on, a warm dose of retrograde cardioplegia was administered.  The bulldog clamp was removed from the left mammary artery.  Septal rewarming was again noted.  Additional  de-airing was performed via the root vent.  The aortic cross clamp was removed.  The total crossclamp time was 84 minutes.  The patient was initially in ventricular fibrillation.  The left ventricle was decompressed with the vent.  A single  defibrillation with 10 joules resulted in resumption of sinus rhythm with a rate in the 60s. While rewarming was completed, all proximal and distal anastomoses and the aortotomy were inspected for hemostasis.  Epicardial pacing wires were placed on the  right ventricle and right  atrium.  Initially atrial and then DDD pacing were initiated at 90 beats per minute.  When the patient rewarmed to a core temperature 37 degrees Celsius, he was weaned from cardiopulmonary bypass on the first attempt.  Total bypass  time was 113 minutes.  The initial cardiac index was 2 liters per minute per meter squared.  Transesophageal echocardiography showed no change in left ventricular function from the pre-bypass study.  There was good function of the prosthetic valve with  no paravalvular leaks.  The retrograde cannula had been removed prior to  separation from bypass.  After coming off pump and seeing no residual air, the left ventricular vent was removed.  It should be noted that carbon dioxide was insufflated into the  operative field beginning at the time of cross clamp placement and continued until after the closure of the aortotomy.  A test dose of protamine was administered and was well tolerated.  The atrial cannula, the aortic cardioplegia cannula and the aortic  cannula were removed.  There was good hemostasis at all sites.  The remainder of protamine was administered without incident.  The chest was irrigated with warm saline.  Hemostasis was achieved.  The pericardium was reapproximated over the aorta and  heart with interrupted 3-0 silk sutures.  It came together easily without tension.  There were no hemodynamic changes with closure of the pericardium.  The chest was copiously irrigated with warm saline.  Left pleural and mediastinal chest tubes were  placed through separate subcostal incisions.  Prior to closure of the chest, the cardiac index was noted to have dropped with a cardiac output of approximately 3.5 liters per minute.  The patient responded to volume administration with improvement of his  cardiac function.  The sternum was closed with a combination of single and double heavy gauge stainless steel wires.  The pectoralis fascia, subcutaneous tissue and skin were closed using standard technique cardiac index also dropped after closure of  the sternum again with no changes in heart rate, blood pressure or pulmonary artery pressures.  Transesophageal echocardiography showed no change in left ventricular function, but relative left ventricular underfilling.    The patient was transported from the operating room to the Surgical Intensive Care Unit, intubated and in good condition.  AN/NUANCE  D:03/04/2018 T:03/04/2018 JOB:[id number]

## 2018-03-04 NOTE — Anesthesia Procedure Notes (Signed)
Central Venous Catheter Insertion Performed by: Roderic Palau, MD, anesthesiologist Start/End7/05/2018 7:50 AM, 03/04/2018 8:05 AM Patient location: Pre-op. Preanesthetic checklist: patient identified, IV checked, site marked, risks and benefits discussed, surgical consent, monitors and equipment checked, pre-op evaluation, timeout performed and anesthesia consent Hand hygiene performed  and maximum sterile barriers used  PA cath was placed.Swan type:thermodilution PA Cath depth:20 Procedure performed without using ultrasound guided technique. Attempts: 1 Patient tolerated the procedure well with no immediate complications.

## 2018-03-04 NOTE — Interval H&P Note (Signed)
History and Physical Interval Note:  03/04/2018 7:49 AM  [name]  has presented today for surgery, with the diagnosis of AORTIC STENOSIS, SINGLE VESSEL CAD  The various methods of treatment have been discussed with the patient and family. After consideration of risks, benefits and other options for treatment, the patient has consented to  Procedure(s): AORTIC VALVE REPLACEMENT (AVR) (N/A) CORONARY ARTERY BYPASS GRAFTING (CABG) (N/A) TRANSESOPHAGEAL ECHOCARDIOGRAM (TEE) (N/A) as a surgical intervention .  The patient's history has been reviewed, patient examined, no change in status, stable for surgery.  I have reviewed the patient's chart and labs.  Questions were answered to the patient's satisfaction.     Melrose Nakayama

## 2018-03-04 NOTE — Anesthesia Postprocedure Evaluation (Signed)
Anesthesia Post Note  Patient: [name]  Procedure(s) Performed: AORTIC VALVE REPLACEMENT (AVR) 60mm [employer] Ease Tissue Valve. (N/A Chest) CORONARY ARTERY BYPASS GRAFTING (CABG) x1:  LIMA to LAD. (N/A Chest) TRANSESOPHAGEAL ECHOCARDIOGRAM (TEE) (N/A )     Patient location during evaluation: SICU Anesthesia Type: General Level of consciousness: sedated Pain management: pain level controlled Vital Signs Assessment: post-procedure vital signs reviewed and stable Respiratory status: patient remains intubated per anesthesia plan Cardiovascular status: stable Postop Assessment: no apparent nausea or vomiting Anesthetic complications: no    Last Vitals:  Vitals:   03/04/18 1830 03/04/18 1845  BP:    Pulse: 90 90  Resp: 13 13  Temp: 36.7 C 36.8 C  SpO2: 94% 94%    Last Pain:  Vitals:   03/04/18 1400  PainSc: 0-No pain                 Ander Wamser DAVID

## 2018-03-04 NOTE — Brief Op Note (Signed)
03/04/2018  2:25 PM  PATIENT:  [name]  67 y.o. male  PRE-OPERATIVE DIAGNOSIS:  AORTIC STENOSIS, SINGLE VESSEL CAD  POST-OPERATIVE DIAGNOSIS:  Aortic Stenosis, Single Vessel CAD  PROCEDURE:  Procedure(s): AORTIC VALVE REPLACEMENT (AVR) 55mm Edwards Magna Ease Tissue Valve. (N/A) CORONARY ARTERY BYPASS GRAFTING (CABG) x1:  LIMA to LAD. (N/A) TRANSESOPHAGEAL ECHOCARDIOGRAM (TEE) (N/A)  SURGEON:  Surgeon(s) and Role:    * Melrose Nakayama, MD - Primary  PHYSICIAN ASSISTANT: WAYNE GOLD PA-C  ANESTHESIA:   general  EBL:  500 mL   BLOOD ADMINISTERED: cell saver  DRAINS: PLEURAL AND PERICARDIAL CHEST TUBES   LOCAL MEDICATIONS USED:  NONE  SPECIMEN:  No Specimen  DISPOSITION OF SPECIMEN:  N/A  COUNTS:  YES  TOURNIQUET:  * No tourniquets in log *  DICTATION: .Other Dictation: Dictation Number PENDING  PLAN OF CARE: Admit to inpatient   PATIENT DISPOSITION:  ICU - intubated and hemodynamically stable.   Delay start of Pharmacological VTE agent (>24hrs) due to surgical blood loss or risk of bleeding: yes  COMPLICATIONS: NO KNOWN

## 2018-03-04 NOTE — Progress Notes (Signed)
NIF -22, VC .9L with good patient effort

## 2018-03-04 NOTE — Anesthesia Procedure Notes (Signed)
Arterial Line Insertion Start/End7/05/2018 7:55 AM, 03/04/2018 8:00 AM Performed by: CRNA  Preanesthetic checklist: patient identified, IV checked, site marked, risks and benefits discussed, surgical consent, monitors and equipment checked, pre-op evaluation, timeout performed and anesthesia consent Lidocaine 1% used for infiltration and patient sedated Left, radial was placed Catheter size: 20 G Hand hygiene performed  and maximum sterile barriers used   Attempts: 1 Procedure performed without using ultrasound guided technique. Following insertion, dressing applied and Biopatch. Post procedure assessment: normal  Patient tolerated the procedure well with no immediate complications.

## 2018-03-04 NOTE — Anesthesia Procedure Notes (Signed)
Central Venous Catheter Insertion Performed by: Roderic Palau, MD, anesthesiologist Start/End7/05/2018 7:50 AM, 03/04/2018 8:05 AM Patient location: Pre-op. Preanesthetic checklist: patient identified, IV checked, site marked, risks and benefits discussed, surgical consent, monitors and equipment checked, pre-op evaluation, timeout performed and anesthesia consent Position: Trendelenburg Lidocaine 1% used for infiltration and patient sedated Hand hygiene performed , maximum sterile barriers used  and Seldinger technique used Catheter size: 9 Fr Total catheter length 10. Central line was placed.MAC introducer Procedure performed using ultrasound guided technique. Ultrasound Notes:anatomy identified, needle tip was noted to be adjacent to the nerve/plexus identified, no ultrasound evidence of intravascular and/or intraneural injection and image(s) printed for medical record Attempts: 1 Following insertion, line sutured, dressing applied and Biopatch. Post procedure assessment: blood return through all ports, free fluid flow and no air  Patient tolerated the procedure well with no immediate complications.

## 2018-03-04 NOTE — Progress Notes (Signed)
  Echocardiogram Echocardiogram Transesophageal has been performed.  [name] 03/04/2018, 10:55 AM

## 2018-03-04 NOTE — Anesthesia Preprocedure Evaluation (Addendum)
Anesthesia Evaluation  Patient identified by MRN, date of birth, ID band Patient awake    History of Anesthesia Complications Negative for: history of anesthetic complications  Airway Mallampati: I  TM Distance: >3 FB Neck ROM: Full    Dental  (+) Dental Advisory Given   Pulmonary COPD, former smoker,    Pulmonary exam normal        Cardiovascular hypertension, + CAD  Normal cardiovascular exam+ Valvular Problems/Murmurs AS      Neuro/Psych    GI/Hepatic negative GI ROS, Neg liver ROS,   Endo/Other  diabetes  Renal/GU negative Renal ROS     Musculoskeletal   Abdominal   Peds  Hematology   Anesthesia Other Findings   Reproductive/Obstetrics                            Anesthesia Physical Anesthesia Plan  ASA: III  Anesthesia Plan: General   Post-op Pain Management:    Induction: Intravenous  PONV Risk Score and Plan: 2 and Treatment may vary due to age or medical condition  Airway Management Planned: Oral ETT  Additional Equipment: Arterial line, CVP, PA Cath, TEE and Ultrasound Guidance Line Placement  Intra-op Plan:   Post-operative Plan: Post-operative intubation/ventilation  Informed Consent: I have reviewed the patients History and Physical, chart, labs and discussed the procedure including the risks, benefits and alternatives for the proposed anesthesia with the patient or authorized representative who has indicated his/her understanding and acceptance.     Plan Discussed with: CRNA and Surgeon  Anesthesia Plan Comments:         Anesthesia Quick Evaluation

## 2018-03-04 NOTE — Transfer of Care (Signed)
Immediate Anesthesia Transfer of Care Note  Patient: [name]  Procedure(s) Performed: AORTIC VALVE REPLACEMENT (AVR) 85mm Edwards Magna Ease Tissue Valve. (N/A Chest) CORONARY ARTERY BYPASS GRAFTING (CABG) x1:  LIMA to LAD. (N/A Chest) TRANSESOPHAGEAL ECHOCARDIOGRAM (TEE) (N/A )  Patient Location: ICU  Anesthesia Type:General  Level of Consciousness: sedated and Patient remains intubated per anesthesia plan  Airway & Oxygen Therapy: Patient remains intubated per anesthesia plan and Patient placed on Ventilator (see vital sign flow sheet for setting)  Post-op Assessment: Report given to RN and Post -op Vital signs reviewed and stable  Post vital signs: Reviewed and stable  Last Vitals:  BP 107/67 per Aline HR 90 (paced) PAP 30/17 SpO2 97% on 50% FiO2  Last Pain:  Vitals:   03/04/18 0635  PainSc: 0-No pain         Complications: No apparent anesthesia complications

## 2018-03-04 NOTE — Progress Notes (Signed)
Patient ID: [name], male   DOB: 02-Mar-1951, 67 y.o.   MRN: [id number]  TCTS Evening Rounds:   Hemodynamically stable  CI = 2.86 on dopamine 3  Has started to wake up on vent.   Urine output good  CT output low  CBC    Component Value Date/Time   WBC 13.0 (H) 03/04/2018 1353   RBC 4.98 03/04/2018 1353   HGB 12.2 (L) 03/04/2018 1359   HCT 36.0 (L) 03/04/2018 1359   PLT 234 03/04/2018 1353   MCV 76.3 (L) 03/04/2018 1353   MCH 23.1 (L) 03/04/2018 1353   MCHC 30.3 03/04/2018 1353   RDW 15.2 03/04/2018 1353   LYMPHSABS 1.9 12/30/2017 0858   MONOABS 0.7 12/30/2017 0858   EOSABS 0.1 12/30/2017 0858   BASOSABS 0.0 12/30/2017 0858     BMET    Component Value Date/Time   NA 146 (H) 03/04/2018 1359   K 4.7 03/04/2018 1359   CL 109 03/02/2018 1502   CO2 23 03/02/2018 1502   GLUCOSE 122 (H) 03/04/2018 1359   BUN 12 03/02/2018 1502   CREATININE 0.78 03/02/2018 1502   CREATININE 0.84 11/30/2013 0856   CALCIUM 9.2 03/02/2018 1502   GFRNONAA >60 03/02/2018 1502   GFRNONAA >89 10/26/2013 0906   GFRAA >60 03/02/2018 1502   GFRAA >89 10/26/2013 0906     A/P:  Stable postop course. Continue current plans. Continue vent wean.

## 2018-03-04 NOTE — Progress Notes (Signed)
Patient interviewed in the preop area. Wife at beside. Patient able to confirm name, DOB, NPO status, procedure, allergies and no metal. Patient denies any pain at this time. Patient arrived to OR with neckline and arterial line in place. Patient able to move over to the OR table with minimal assistance.  Leatha Gilding, RN

## 2018-03-04 NOTE — Procedures (Signed)
Extubation Procedure Note  Patient Details:   Name: [name] DOB: Oct 20, 1950 MRN: [id number]   Airway Documentation:  Airway 8 mm (Active)  Secured at (cm) 22 cm 03/04/2018  3:55 PM  Measured From Lips 03/04/2018  3:55 PM  Secured Location Right 03/04/2018  3:55 PM  Secured By Rana Snare Tape 03/04/2018  3:55 PM  Site Condition Dry 03/04/2018  3:55 PM   Vent end date: (not recorded) Vent end time: (not recorded)   Evaluation  O2 sats: stable throughout Complications: No apparent complications Patient did tolerate procedure well. Bilateral Breath Sounds: Diminished   Yes   Patient extubated to [facility] without any complicatios. Patient able to speak clearly and has an adequate cough.  Blanchie Serve 03/04/2018, 8:13 PM

## 2018-03-05 ENCOUNTER — Other Ambulatory Visit: Payer: Self-pay | Admitting: Family

## 2018-03-05 ENCOUNTER — Inpatient Hospital Stay (HOSPITAL_COMMUNITY): Payer: Medicare Other

## 2018-03-05 ENCOUNTER — Encounter (HOSPITAL_COMMUNITY): Payer: Self-pay | Admitting: Thoracic Surgery (Cardiothoracic Vascular Surgery)

## 2018-03-05 LAB — GLUCOSE, CAPILLARY
GLUCOSE-CAPILLARY: 126 mg/dL — AB (ref 70–99)
GLUCOSE-CAPILLARY: 121 mg/dL — AB (ref 70–99)
GLUCOSE-CAPILLARY: 115 mg/dL — AB (ref 70–99)
GLUCOSE-CAPILLARY: 142 mg/dL — AB (ref 70–99)
GLUCOSE-CAPILLARY: 156 mg/dL — AB (ref 70–99)
Glucose-Capillary: 110 mg/dL — ABNORMAL HIGH (ref 70–99)
Glucose-Capillary: 111 mg/dL — ABNORMAL HIGH (ref 70–99)
Glucose-Capillary: 112 mg/dL — ABNORMAL HIGH (ref 70–99)
Glucose-Capillary: 116 mg/dL — ABNORMAL HIGH (ref 70–99)
Glucose-Capillary: 130 mg/dL — ABNORMAL HIGH (ref 70–99)
Glucose-Capillary: 130 mg/dL — ABNORMAL HIGH (ref 70–99)
Glucose-Capillary: 131 mg/dL — ABNORMAL HIGH (ref 70–99)
Glucose-Capillary: 200 mg/dL — ABNORMAL HIGH (ref 70–99)
Glucose-Capillary: 99 mg/dL (ref 70–99)

## 2018-03-05 LAB — POCT I-STAT 3, ART BLOOD GAS (G3+)
ACID-BASE DEFICIT: 4 mmol/L — AB (ref 0.0–2.0)
ACID-BASE DEFICIT: 1 mmol/L (ref 0.0–2.0)
Acid-base deficit: 2 mmol/L (ref 0.0–2.0)
BICARBONATE: 25.1 mmol/L (ref 20.0–28.0)
BICARBONATE: 23.4 mmol/L (ref 20.0–28.0)
Bicarbonate: 25.6 mmol/L (ref 20.0–28.0)
O2 SAT: 100 %
O2 Saturation: 100 %
O2 Saturation: 100 %
PCO2 ART: 41.2 mmHg (ref 32.0–48.0)
PH ART: 7.33 — AB (ref 7.350–7.450)
PH ART: 7.363 (ref 7.350–7.450)
TCO2: 27 mmol/L (ref 22–32)
TCO2: 27 mmol/L (ref 22–32)
TCO2: 25 mmol/L (ref 22–32)
pCO2 arterial: 62.5 mmHg — ABNORMAL HIGH (ref 32.0–48.0)
pCO2 arterial: 48.5 mmHg — ABNORMAL HIGH (ref 32.0–48.0)
pH, Arterial: 7.211 — ABNORMAL LOW (ref 7.350–7.450)
pO2, Arterial: 316 mmHg — ABNORMAL HIGH (ref 83.0–108.0)
pO2, Arterial: 372 mmHg — ABNORMAL HIGH (ref 83.0–108.0)
pO2, Arterial: 272 mmHg — ABNORMAL HIGH (ref 83.0–108.0)

## 2018-03-05 LAB — CBC
HCT: 35.6 % — ABNORMAL LOW (ref 39.0–52.0)
HEMATOCRIT: 37 % — AB (ref 39.0–52.0)
HEMOGLOBIN: 11.1 g/dL — AB (ref 13.0–17.0)
Hemoglobin: 10.8 g/dL — ABNORMAL LOW (ref 13.0–17.0)
MCH: 23 pg — AB (ref 26.0–34.0)
MCH: 22.8 pg — AB (ref 26.0–34.0)
MCHC: 30.3 g/dL (ref 30.0–36.0)
MCHC: 30 g/dL (ref 30.0–36.0)
MCV: 75.9 fL — ABNORMAL LOW (ref 78.0–100.0)
MCV: 76.1 fL — AB (ref 78.0–100.0)
Platelets: 229 10*3/uL (ref 150–400)
Platelets: 251 10*3/uL (ref 150–400)
RBC: 4.69 MIL/uL (ref 4.22–5.81)
RBC: 4.86 MIL/uL (ref 4.22–5.81)
RDW: 15.5 % (ref 11.5–15.5)
RDW: 15.6 % — AB (ref 11.5–15.5)
WBC: 10.2 10*3/uL (ref 4.0–10.5)
WBC: 14 10*3/uL — ABNORMAL HIGH (ref 4.0–10.5)

## 2018-03-05 LAB — POCT I-STAT, CHEM 8
BUN: 11 mg/dL (ref 8–23)
BUN: 11 mg/dL (ref 8–23)
BUN: 9 mg/dL (ref 8–23)
BUN: 10 mg/dL (ref 8–23)
BUN: 10 mg/dL (ref 8–23)
BUN: 13 mg/dL (ref 8–23)
CALCIUM ION: 1.27 mmol/L (ref 1.15–1.40)
CALCIUM ION: 1.23 mmol/L (ref 1.15–1.40)
CALCIUM ION: 1.01 mmol/L — AB (ref 1.15–1.40)
CALCIUM ION: 1.13 mmol/L — AB (ref 1.15–1.40)
CALCIUM ION: 1.2 mmol/L (ref 1.15–1.40)
CHLORIDE: 105 mmol/L (ref 98–111)
CHLORIDE: 107 mmol/L (ref 98–111)
CHLORIDE: 103 mmol/L (ref 98–111)
CREATININE: 0.6 mg/dL — AB (ref 0.61–1.24)
CREATININE: 0.5 mg/dL — AB (ref 0.61–1.24)
Calcium, Ion: 1.13 mmol/L — ABNORMAL LOW (ref 1.15–1.40)
Chloride: 107 mmol/L (ref 98–111)
Chloride: 104 mmol/L (ref 98–111)
Chloride: 107 mmol/L (ref 98–111)
Creatinine, Ser: 0.6 mg/dL — ABNORMAL LOW (ref 0.61–1.24)
Creatinine, Ser: 0.6 mg/dL — ABNORMAL LOW (ref 0.61–1.24)
Creatinine, Ser: 0.7 mg/dL (ref 0.61–1.24)
Creatinine, Ser: 0.8 mg/dL (ref 0.61–1.24)
GLUCOSE: 140 mg/dL — AB (ref 70–99)
GLUCOSE: 101 mg/dL — AB (ref 70–99)
GLUCOSE: 126 mg/dL — AB (ref 70–99)
GLUCOSE: 150 mg/dL — AB (ref 70–99)
Glucose, Bld: 114 mg/dL — ABNORMAL HIGH (ref 70–99)
Glucose, Bld: 115 mg/dL — ABNORMAL HIGH (ref 70–99)
HCT: 39 % (ref 39.0–52.0)
HCT: 33 % — ABNORMAL LOW (ref 39.0–52.0)
HCT: 31 % — ABNORMAL LOW (ref 39.0–52.0)
HCT: 36 % — ABNORMAL LOW (ref 39.0–52.0)
HEMATOCRIT: 39 % (ref 39.0–52.0)
HEMATOCRIT: 33 % — AB (ref 39.0–52.0)
HEMOGLOBIN: 13.3 g/dL (ref 13.0–17.0)
HEMOGLOBIN: 11.2 g/dL — AB (ref 13.0–17.0)
HEMOGLOBIN: 12.2 g/dL — AB (ref 13.0–17.0)
Hemoglobin: 13.3 g/dL (ref 13.0–17.0)
Hemoglobin: 11.2 g/dL — ABNORMAL LOW (ref 13.0–17.0)
Hemoglobin: 10.5 g/dL — ABNORMAL LOW (ref 13.0–17.0)
POTASSIUM: 4.1 mmol/L (ref 3.5–5.1)
POTASSIUM: 5.1 mmol/L (ref 3.5–5.1)
POTASSIUM: 4.8 mmol/L (ref 3.5–5.1)
Potassium: 4.3 mmol/L (ref 3.5–5.1)
Potassium: 4.5 mmol/L (ref 3.5–5.1)
Potassium: 4.3 mmol/L (ref 3.5–5.1)
SODIUM: 142 mmol/L (ref 135–145)
SODIUM: 142 mmol/L (ref 135–145)
Sodium: 144 mmol/L (ref 135–145)
Sodium: 143 mmol/L (ref 135–145)
Sodium: 144 mmol/L (ref 135–145)
Sodium: 141 mmol/L (ref 135–145)
TCO2: 27 mmol/L (ref 22–32)
TCO2: 26 mmol/L (ref 22–32)
TCO2: 26 mmol/L (ref 22–32)
TCO2: 27 mmol/L (ref 22–32)
TCO2: 24 mmol/L (ref 22–32)
TCO2: 26 mmol/L (ref 22–32)

## 2018-03-05 LAB — BASIC METABOLIC PANEL
Anion gap: 11 (ref 5–15)
BUN: 11 mg/dL (ref 8–23)
CALCIUM: 8.5 mg/dL — AB (ref 8.9–10.3)
CHLORIDE: 108 mmol/L (ref 98–111)
CO2: 25 mmol/L (ref 22–32)
CREATININE: 0.9 mg/dL (ref 0.61–1.24)
GFR calc non Af Amer: >60 mL/min (ref >60)
GLUCOSE: 118 mg/dL — AB (ref 70–99)
Potassium: 4.2 mmol/L (ref 3.5–5.1)
Sodium: 144 mmol/L (ref 135–145)

## 2018-03-05 LAB — MAGNESIUM
MAGNESIUM: 2.3 mg/dL (ref 1.7–2.4)
Magnesium: 2.4 mg/dL (ref 1.7–2.4)

## 2018-03-05 LAB — CREATININE, SERUM
Creatinine, Ser: 0.94 mg/dL (ref 0.61–1.24)
GFR calc Af Amer: >60 mL/min (ref >60)

## 2018-03-05 MED ORDER — INSULIN DETEMIR 100 UNIT/ML ~~LOC~~ SOLN
25.0000 [IU] | Freq: Two times a day (BID) | SUBCUTANEOUS | Status: DC
  Administered 2018-03-05: 25 [IU] via SUBCUTANEOUS
  Filled 2018-03-05 (×2): qty 0.25

## 2018-03-05 MED ORDER — ENOXAPARIN SODIUM 40 MG/0.4ML ~~LOC~~ SOLN
40.0000 mg | Freq: Every day | SUBCUTANEOUS | Status: DC
  Administered 2018-03-05 – 2018-03-09 (×5): 40 mg via SUBCUTANEOUS
  Filled 2018-03-05 (×5): qty 0.4

## 2018-03-05 MED ORDER — INSULIN ASPART 100 UNIT/ML ~~LOC~~ SOLN
0.0000 [IU] | SUBCUTANEOUS | Status: DC
  Administered 2018-03-05: 4 [IU] via SUBCUTANEOUS
  Administered 2018-03-05 (×2): 2 [IU] via SUBCUTANEOUS

## 2018-03-05 MED ORDER — INSULIN DETEMIR 100 UNIT/ML ~~LOC~~ SOLN
20.0000 [IU] | Freq: Two times a day (BID) | SUBCUTANEOUS | Status: DC
  Administered 2018-03-05: 20 [IU] via SUBCUTANEOUS
  Filled 2018-03-05 (×2): qty 0.2

## 2018-03-05 MED ORDER — ROSUVASTATIN CALCIUM 10 MG PO TABS
20.0000 mg | ORAL_TABLET | Freq: Every day | ORAL | Status: DC
  Administered 2018-03-05 – 2018-03-10 (×6): 20 mg via ORAL
  Filled 2018-03-05: qty 2
  Filled 2018-03-05: qty 1
  Filled 2018-03-05 (×3): qty 2
  Filled 2018-03-05: qty 1

## 2018-03-05 MED ORDER — CANAGLIFLOZIN 100 MG PO TABS
100.0000 mg | ORAL_TABLET | Freq: Every day | ORAL | Status: DC
  Administered 2018-03-05 – 2018-03-10 (×6): 100 mg via ORAL
  Filled 2018-03-05 (×6): qty 1

## 2018-03-05 MED FILL — Potassium Chloride Inj 2 mEq/ML: INTRAVENOUS | Qty: 40 | Status: AC

## 2018-03-05 MED FILL — Heparin Sodium (Porcine) Inj 1000 Unit/ML: INTRAMUSCULAR | Qty: 30 | Status: AC

## 2018-03-05 MED FILL — Magnesium Sulfate Inj 50%: INTRAMUSCULAR | Qty: 10 | Status: AC

## 2018-03-05 NOTE — Progress Notes (Signed)
1 Day Post-Op Procedure(s) (LRB): AORTIC VALVE REPLACEMENT (AVR) 66mm Edwards Physicist, medical Valve. (N/A) CORONARY ARTERY BYPASS GRAFTING (CABG) x1:  LIMA to LAD. (N/A) TRANSESOPHAGEAL ECHOCARDIOGRAM (TEE) (N/A) Subjective: Some incisional pain  Objective: Vital signs in last 24 hours: Temp:  [95.9 F (35.5 C)-100.2 F (37.9 C)] 99.5 F (37.5 C) (07/11 0700) Pulse Rate:  [80-104] 91 (07/11 0700) Resp:  [7-30] 19 (07/11 0700) BP: (98-129)/(60-82) 103/60 (07/11 0500) SpO2:  [85 %-98 %] 93 % (07/11 0700) Arterial Line BP: (74-150)/(47-74) 126/54 (07/11 0700) FiO2 (%):  [40 %-50 %] 40 % (07/10 1755) Weight:  [181 lb 4 oz (82.2 kg)] 181 lb 4 oz (82.2 kg) (07/11 0500)  Hemodynamic parameters for last 24 hours: PAP: (23-62)/(14-30) 49/24 CO:  [2.7 L/min-6.4 L/min] 6.4 L/min CI:  [1.3 L/min/m2-3 L/min/m2] 3 L/min/m2  Intake/Output from previous day: 07/10 0701 - 07/11 0700 In: 6525 [I.V.:3807.9; Blood:550; IV Piggyback:2167.1] Out: 9983 [Urine:6200; Blood:500; Chest Tube:470] Intake/Output this shift: Total I/O In: 25.6 [I.V.:25.6] Out: 40 [Chest Tube:40]  General appearance: alert, cooperative and no distress Neurologic: intact Heart: regular rate and rhythm Lungs: diminished breath sounds bibasilar Abdomen: normal findings: soft, non-tender  Lab Results: Recent Labs    03/04/18 1947 03/04/18 1957 03/05/18 0358  WBC 10.0  --  10.2  HGB 10.9* 12.2* 10.8*  HCT 36.0* 36.0* 35.6*  PLT 221  --  229   BMET:  Recent Labs    03/02/18 1502  03/04/18 1957 03/05/18 0358  NA 139   < > 148* 144  K 4.4   < > 4.3 4.2  CL 109  --  111 108  CO2 23  --   --  25  GLUCOSE 91   < > 122* 118*  BUN 12  --  10 11  CREATININE 0.78   < > 0.70 0.90  CALCIUM 9.2  --   --  8.5*   < > = values in this interval not displayed.    PT/INR:  Recent Labs    03/04/18 1353  LABPROT 20.1*  INR 1.73   ABG    Component Value Date/Time   PHART 7.315 (L) 03/04/2018 2123   HCO3 23.9  03/04/2018 2123   TCO2 25 03/04/2018 2123   ACIDBASEDEF 2.0 03/04/2018 2123   O2SAT 92.0 03/04/2018 2123   CBG (last 3)  Recent Labs    03/05/18 0411 03/05/18 0529 03/05/18 0658  GLUCAP 115* 112* 111*    Assessment/Plan: S/P Procedure(s) (LRB): AORTIC VALVE REPLACEMENT (AVR) 11mm Edwards Physicist, medical Valve. (N/A) CORONARY ARTERY BYPASS GRAFTING (CABG) x1:  LIMA to LAD. (N/A) TRANSESOPHAGEAL ECHOCARDIOGRAM (TEE) (N/A) -Doing well POD #1 CV- hemodynamically stable, wean dopamine, dc Swan and A line  ASA, statin, beta blocker  In SR at 90  Pericardial valve- will plan ASA only unless another indication for anticoagulation  ECG- T wave inversions, QTc 492 down from 550  RESP- IS   RENAL- creatinine and lytes OK- diurese as needed  ENDO- CBG well controlled- change to AC/HS  Dc chest tubes  Mobilize, cardiac rehab   LOS: 1 day    [name] 03/05/2018

## 2018-03-05 NOTE — Progress Notes (Signed)
      [address]       [place] [address]             [phone]      No compliants  BP 122/77 (BP Location: Left Arm)   Pulse 89   Temp 99 F (37.2 C) (Oral)   Resp (!) 21   Ht 6\' 4"  (1.93 m)   Wt 181 lb 4 oz (82.2 kg)   SpO2 95%   BMI 22.06 kg/m   Intake/Output Summary (Last 24 hours) at 03/05/2018 1822 Last data filed at 03/05/2018 1800 Gross per 24 hour  Intake 1884.62 ml  Output 2804 ml  Net -919.38 ml   creatinine 0.94, K= 4.3 Hct= 37  CBG elevated, increase levemir  Rhylan Kagel C. Roxan Hockey, MD [facility] [phone]

## 2018-03-06 ENCOUNTER — Inpatient Hospital Stay (HOSPITAL_COMMUNITY): Payer: Medicare Other

## 2018-03-06 ENCOUNTER — Encounter (HOSPITAL_BASED_OUTPATIENT_CLINIC_OR_DEPARTMENT_OTHER): Payer: Medicare Other

## 2018-03-06 LAB — BASIC METABOLIC PANEL
Anion gap: 8 (ref 5–15)
BUN: 14 mg/dL (ref 8–23)
CO2: 27 mmol/L (ref 22–32)
Calcium: 8.5 mg/dL — ABNORMAL LOW (ref 8.9–10.3)
Chloride: 106 mmol/L (ref 98–111)
Creatinine, Ser: 0.97 mg/dL (ref 0.61–1.24)
GFR calc Af Amer: >60 mL/min (ref >60)
GFR calc non Af Amer: >60 mL/min (ref >60)
Glucose, Bld: 91 mg/dL (ref 70–99)
Potassium: 4.5 mmol/L (ref 3.5–5.1)
Sodium: 141 mmol/L (ref 135–145)

## 2018-03-06 LAB — CBC
HEMATOCRIT: 35.3 % — AB (ref 39.0–52.0)
Hemoglobin: 10.5 g/dL — ABNORMAL LOW (ref 13.0–17.0)
MCH: 22.9 pg — ABNORMAL LOW (ref 26.0–34.0)
MCHC: 29.7 g/dL — AB (ref 30.0–36.0)
MCV: 76.9 fL — AB (ref 78.0–100.0)
PLATELETS: 233 10*3/uL (ref 150–400)
RBC: 4.59 MIL/uL (ref 4.22–5.81)
RDW: 15.7 % — AB (ref 11.5–15.5)
WBC: 14 10*3/uL — AB (ref 4.0–10.5)

## 2018-03-06 LAB — GLUCOSE, CAPILLARY
GLUCOSE-CAPILLARY: 134 mg/dL — AB (ref 70–99)
GLUCOSE-CAPILLARY: 116 mg/dL — AB (ref 70–99)
Glucose-Capillary: 87 mg/dL (ref 70–99)
Glucose-Capillary: 70 mg/dL (ref 70–99)
Glucose-Capillary: 108 mg/dL — ABNORMAL HIGH (ref 70–99)
Glucose-Capillary: 112 mg/dL — ABNORMAL HIGH (ref 70–99)

## 2018-03-06 MED ORDER — MAGNESIUM HYDROXIDE 400 MG/5ML PO SUSP: 30.0000 mL | Freq: Every day | ORAL | Status: DC | PRN

## 2018-03-06 MED ORDER — SODIUM CHLORIDE 0.9% FLUSH
3.0000 mL | Freq: Two times a day (BID) | INTRAVENOUS | Status: DC
  Administered 2018-03-06 – 2018-03-10 (×9): 3 mL via INTRAVENOUS

## 2018-03-06 MED ORDER — METFORMIN HCL ER 500 MG PO TB24
1000.0000 mg | ORAL_TABLET | Freq: Two times a day (BID) | ORAL | Status: DC
  Administered 2018-03-06 – 2018-03-10 (×9): 1000 mg via ORAL
  Filled 2018-03-06 (×10): qty 2

## 2018-03-06 MED ORDER — INSULIN ASPART 100 UNIT/ML ~~LOC~~ SOLN
0.0000 [IU] | Freq: Three times a day (TID) | SUBCUTANEOUS | Status: DC
  Administered 2018-03-07: 2 [IU] via SUBCUTANEOUS
  Administered 2018-03-09: 5 [IU] via SUBCUTANEOUS
  Administered 2018-03-10: 2 [IU] via SUBCUTANEOUS

## 2018-03-06 MED ORDER — INSULIN DETEMIR 100 UNIT/ML ~~LOC~~ SOLN
20.0000 [IU] | Freq: Two times a day (BID) | SUBCUTANEOUS | Status: DC
  Administered 2018-03-06 – 2018-03-07 (×2): 20 [IU] via SUBCUTANEOUS
  Filled 2018-03-06 (×4): qty 0.2

## 2018-03-06 MED ORDER — ALUM & MAG HYDROXIDE-SIMETH 200-200-20 MG/5ML PO SUSP: 15.0000 mL | Freq: Four times a day (QID) | ORAL | Status: DC | PRN

## 2018-03-06 MED ORDER — SODIUM CHLORIDE 0.9 % IV SOLN: 250.0000 mL | INTRAVENOUS | Status: DC | PRN

## 2018-03-06 MED ORDER — MOVING RIGHT ALONG BOOK
Freq: Once | Status: DC
  Filled 2018-03-06: qty 1

## 2018-03-06 MED ORDER — LISINOPRIL 10 MG PO TABS
10.0000 mg | ORAL_TABLET | Freq: Every day | ORAL | Status: DC
  Administered 2018-03-06 – 2018-03-09 (×4): 10 mg via ORAL
  Filled 2018-03-06 (×4): qty 1

## 2018-03-06 MED ORDER — SODIUM CHLORIDE 0.9% FLUSH: 3.0000 mL | INTRAVENOUS | Status: DC | PRN

## 2018-03-06 MED FILL — Mannitol IV Soln 20%: INTRAVENOUS | Qty: 500 | Status: AC

## 2018-03-06 MED FILL — Lidocaine HCl(Cardiac) IV PF Soln Pref Syr 100 MG/5ML (2%): INTRAVENOUS | Qty: 5 | Status: AC

## 2018-03-06 MED FILL — Sodium Bicarbonate IV Soln 8.4%: INTRAVENOUS | Qty: 50 | Status: AC

## 2018-03-06 MED FILL — Heparin Sodium (Porcine) Inj 1000 Unit/ML: INTRAMUSCULAR | Qty: 10 | Status: AC

## 2018-03-06 MED FILL — Electrolyte-R (PH 7.4) Solution: INTRAVENOUS | Qty: 3000 | Status: AC

## 2018-03-06 MED FILL — Sodium Chloride IV Soln 0.9%: INTRAVENOUS | Qty: 2000 | Status: AC

## 2018-03-06 NOTE — Discharge Instructions (Signed)
avr  Aortic Valve Replacement, Care After Refer to this sheet in the next few weeks. These instructions provide you with information about caring for yourself after your procedure. Your health care provider may also give you more specific instructions. Your treatment has been planned according to current medical practices, but problems sometimes occur. Call your health care provider if you have any problems or questions after your procedure. What can I expect after the procedure? After the procedure, it is common to have:  Pain around your incision area.  A small amount of blood or clear fluid coming from your incision.  Follow these instructions at home: Eating and drinking   Follow instructions from your health care provider about eating or drinking restrictions. ? Limit alcohol intake to no more than 1 drink per day for nonpregnant women and 2 drinks per day for men. One drink equals 12 oz of beer, 5 oz of wine, or 1 oz of hard liquor. ? Limit how much caffeine you drink. Caffeine can affect your heart's rate and rhythm.  Drink enough fluid to keep your urine clear or pale yellow.  Eat a heart-healthy diet. This should include plenty of fresh fruits and vegetables. If you eat meat, it should be lean cuts. Avoid foods that are: ? High in salt, saturated fat, or sugar. ? Canned or highly processed. ? Fried. Activity  Return to your normal activities as told by your health care provider. Ask your health care provider what activities are safe for you.  Exercise regularly once you have recovered, as told by your health care provider.  Avoid sitting for more than 2 hours at a time without moving. Get up and move around at least once every 1-2 hours. This helps to prevent blood clots in the legs.  Do not lift anything that is heavier than 10 lb (4.5 kg) until your health care provider approves.  Avoid pushing or pulling things with your arms until your health care provider approves.  This includes pulling on handrails to help you climb stairs. Incision care   Follow instructions from your health care provider about how to take care of your incision. Make sure you: ? Wash your hands with soap and water before you change your bandage (dressing). If soap and water are not available, use hand sanitizer. ? Change your dressing as told by your health care provider. ? Leave stitches (sutures), skin glue, or adhesive strips in place. These skin closures may need to stay in place for 2 weeks or longer. If adhesive strip edges start to loosen and curl up, you may trim the loose edges. Do not remove adhesive strips completely unless your health care provider tells you to do that.  Check your incision area every day for signs of infection. Check for: ? More redness, swelling, or pain. ? More fluid or blood. ? Warmth. ? Pus or a bad smell. Medicines  Take over-the-counter and prescription medicines only as told by your health care provider.  If you were prescribed an antibiotic medicine, take it as told by your health care provider. Do not stop taking the antibiotic even if you start to feel better. Travel  Avoid airplane travel for as long as told by your health care provider.  When you travel, bring a list of your medicines and a record of your medical history with you. Carry your medicines with you. Driving  Ask your health care provider when it is safe for you to drive. Do not drive until  your health care provider approves.  Do not drive or operate heavy machinery while taking prescription pain medicine. Lifestyle   Do not use any tobacco products, such as cigarettes, chewing tobacco, or e-cigarettes. If you need help quitting, ask your health care provider.  Resume sexual activity as told by your health care provider. Do not use medicines for erectile dysfunction unless your health care provider approves, if this applies.  Work with your health care provider to keep  your blood pressure and cholesterol under control, and to manage any other heart conditions that you have.  Maintain a healthy weight. General instructions  Do not take baths, swim, or use a hot tub until your health care provider approves.  Do not strain to have a bowel movement.  Avoid crossing your legs while sitting down.  Check your temperature every day for a fever. A fever may be a sign of infection.  If you are a woman and you plan to become pregnant, talk with your health care provider before you become pregnant.  Wear compression stockings if your health care provider instructs you to do this. These stockings help to prevent blood clots and reduce swelling in your legs.  Tell all health care providers who care for you that you have an artificial (prosthetic) aortic valve. If you have or have had heart disease or endocarditis, tell all health care providers about these conditions as well.  Keep all follow-up visits as told by your health care provider. This is important. Contact a health care provider if:  You develop a skin rash.  You experience sudden, unexplained changes in your weight.  You have more redness, swelling, or pain around your incision.  You have more fluid or blood coming from your incision.  Your incision feels warm to the touch.  You have pus or a bad smell coming from your incision.  You have a fever. Get help right away if:  You develop chest pain that is different from the pain coming from your incision.  You develop shortness of breath or difficulty breathing.  You start to feel light-headed. These symptoms may represent a serious problem that is an emergency. Do not wait to see if the symptoms will go away. Get medical help right away. Call your local emergency services (911 in the [place] Do not drive yourself to the hospital. This information is not intended to replace advice given to you by your health care provider. Make sure you discuss  any questions you have with your health care provider. Document Released: 02/28/2005 Document Revised: 01/18/2016 Document Reviewed: 07/16/2015 Elsevier Interactive Patient Education  2017 [employer] Coronary Artery Bypass Grafting, Care After These instructions give you information on caring for yourself after your procedure. Your doctor may also give you more specific instructions. Call your doctor if you have any problems or questions after your procedure. Follow these instructions at home:  Only take medicine as told by your doctor. Take medicines exactly as told. Do not stop taking medicines or start any new medicines without talking to your doctor first.  Take your pulse as told by your doctor.  Do deep breathing as told by your doctor. Use your breathing device (incentive spirometer), if given, to practice deep breathing several times a day. Support your chest with a pillow or your arms when you take deep breaths or cough.  Keep the area clean, dry, and protected where the surgery cuts (incisions) were made. Remove bandages (dressings) only as told by your doctor.  If strips were applied to surgical area, do not take them off. They fall off on their own.  Check the surgery area daily for puffiness (swelling), redness, or leaking fluid.  If surgery cuts were made in your legs: ? Avoid crossing your legs. ? Avoid sitting for long periods of time. Change positions every 30 minutes. ? Raise your legs when you are sitting. Place them on pillows.  Wear stockings that help keep blood clots from forming in your legs (compression stockings).  Only take sponge baths until your doctor says it is okay to take showers. Pat the surgery area dry. Do not rub the surgery area with a washcloth or towel. Do not bathe, swim, or use a hot tub until your doctor says it is okay.  Eat foods that are high in fiber. These include raw fruits and vegetables, whole grains, beans, and nuts. Choose lean meats.  Avoid canned, processed, and fried foods.  Drink enough fluids to keep your pee (urine) clear or pale yellow.  Weigh yourself every day.  Rest and limit activity as told by your doctor. You may be told to: ? Stop any activity if you have chest pain, shortness of breath, changes in heartbeat, or dizziness. Get help right away if this happens. ? Move around often for short amounts of time or take short walks as told by your doctor. Gradually become more active. You may need help to strengthen your muscles and build endurance. ? Avoid lifting, pushing, or pulling anything heavier than 10 pounds (4.5 kg) for at least 6 weeks after surgery.  Do not drive until your doctor says it is okay.  Ask your doctor when you can go back to work.  Ask your doctor when you can begin sexual activity again.  Follow up with your doctor as told. Contact a doctor if:  You have puffiness, redness, more pain, or fluid draining from the incision site.  You have a fever.  You have puffiness in your ankles or legs.  You have pain in your legs.  You gain 2 or more pounds (0.9 kg) a day.  You feel sick to your stomach (nauseous) or throw up (vomit).  You have watery poop (diarrhea). Get help right away if:  You have chest pain that goes to your jaw or arms.  You have shortness of breath.  You have a fast or irregular heartbeat.  You notice a "clicking" in your breastbone when you move.  You have numbness or weakness in your arms or legs.  You feel dizzy or light-headed. This information is not intended to replace advice given to you by your health care provider. Make sure you discuss any questions you have with your health care provider. Document Released: 08/17/2013 Document Revised: 01/18/2016 Document Reviewed: 01/19/2013 Elsevier Interactive Patient Education  2017 [employer].

## 2018-03-06 NOTE — Discharge Summary (Signed)
Physician Discharge Summary  Patient ID: [name] MRN: [id number] DOB/AGE: [birth year] 67 y.o.  Admit date: 03/04/2018 Discharge date: 03/10/2018  Admission Diagnoses: Severe aortic stenosis and single-vessel coronary artery disease  Discharge Diagnoses:  Active Problems:   S/P AVR  Patient Active Problem List   Diagnosis Date Noted  . S/P AVR 03/04/2018  . Coronary artery disease 02/12/2018  . Critical aortic valve stenosis 01/12/2018  . Dyspnea on exertion 01/12/2018  . Preventative health care 03/02/2015  . ERECTILE DYSFUNCTION, ORGANIC 02/10/2009  . MYOCARDIAL PERFUSION SCAN, WITH STRESS TEST, ABNORMAL 08/11/2008  . Type 2 diabetes mellitus, uncontrolled, with neuropathy ([place]) 05/20/2008  . Hyperlipemia 10/21/2007  . HTN (hypertension) 10/21/2007  . HEMORRHOIDS, INTERNAL 03/18/2007  . DEGENERATIVE JOINT DISEASE, BACK 03/18/2007  . COLONIC POLYPS, HX OF 03/18/2007   History of present illness:  Patient is a 67 year old male who was referred to Dr. Roxan Hockey for consideration of aortic valve replacement and coronary artery bypass grafting.  The patient has a history of multiple cardiac comorbidities including hypertension, hyperlipidemia, insulin-dependent type 2 diabetes as well as some other medical issues primarily chronic low back pain.  The patient saw his primary care physician recently and there was noted a new heart murmur.  Patient had an echocardiogram ordered but it was not done prior to presenting to the emergency room on 12/29/2017 after "almost passing out".  Chest x-ray at that time showed possible mild edema and he was felt to be volume depleted responding well to IV fluids.  He saw Dr. Agustin Cree in cardiology consultation and echocardiogram was done on 01/12/2018.  This revealed critical aortic stenosis with a valve area of less than 0.5 cm and a mean gradient of more than 60 mmHg.  A cardiac catheterization revealed a 65 to 70% proximal LAD stenosis.   Echocardiogram showed ejection fraction to be 35 to 40%.  The patient was seen in consultation by Dr. Roxan Hockey who evaluated the patient as well as his studies and amended proceeding with aortic valve replacement as well as single-vessel CABG.  The patient was admitted this hospitalization for the procedure.    Discharged Condition: good  Hospital Course: The patient was admitted and on 03/04/2018 taken to the operating room where he underwent the below described procedure.  He tolerated it well and was taken to the surgical intensive care unit in stable condition.  Postoperative hospital course:  The patient has maintained stable hemodynamics initially requiring some dopamine which was weaned without difficulty.  On postoperative day #1 the Swan-Ganz and arterial lines were discontinued.  She has maintained sinus rhythm.  The patient has been started on beta-blocker as well as statin.  He initially was placed on the Glucomander protocol but is being transition to his metformin.  He is also on insulin as well as Wilder Glade which will be transitioned over time based on blood sugars.  It is noted that his most recent hemoglobin A1c is elevated at 7.3.  This is his lowest value however since 2017 so he will require aggressive postoperative lifestyle and nutrition management.  The patient does have a mild postoperative acute blood loss anemia which is stable.  Recent hemoglobin and hematocrit are 11/36.  Renal function is within normal limits.  Most recent BUN/creatinine are 13/.77. The patient does have some elevated blood pressure readings and will be additionally started on an ACE inhibitor and dose was uptitrated, currently on lisinopril 20 mg p.o. daily.  Patient's incisions are healing well without evidence of infection.  He is tolerating diet.  Oxygen has been attempted to be weaned weaned but he continues to have low saturations and will require at least short-term home oxygen at 2 L nasal cannula  continuously.Marland Kitchen  He is tolerating gradually increasing activities using standard cardiac rehab protocols.  At the time of discharge the patient is felt to be stable.  Consults: cardiology  Significant Diagnostic Studies: Routine postoperative serial chest x-rays and labs.  Treatments: surgery:   OPERATIVE REPORT  DATE OF PROCEDURE:  03/04/2018  PREOPERATIVE DIAGNOSIS:  Severe aortic stenosis and single vessel coronary artery disease.  POSTOPERATIVE DIAGNOSIS:  Severe aortic stenosis and single vessel coronary artery disease.  PROCEDURES: 1.  Median sternotomy, extracorporeal circulation.   2.  Aortic valve replacement with 23 mm [facility] Ease bovine pericardial valve (serial [id number], model #3300 TFX). 3.  Coronary artery bypass grafting x1. 4.  Left internal mammary artery to LAD.  SURGEON:   Revonda Standard. Roxan Hockey, MD  ASSISTANT:  [name] Pierini, PA-C  ANESTHESIA:  General.    Discharge Exam: Blood pressure (!) 165/82, pulse 89, temperature 99.3 F (37.4 C), temperature source Axillary, resp. rate 20, height 6\' 4"  (1.93 m), weight 77.7 kg (171 lb 4.8 oz), SpO2 97 %.   General appearance: alert, cooperative and no distress Heart: irregularly irregular rhythm Lungs: mildly dim in lower fields Abdomen: benign Extremities: no edema Wound: incis healing well    Disposition: Discharge disposition: 01-Home or Self Care       Discharge Instructions    Discharge patient   Complete by:  As directed    When home O2 arrangements in place   Discharge disposition:  01-Home or Self Care   Discharge patient date:  03/10/2018     Allergies as of 03/10/2018      Reactions   Amlodipine Besylate Other (See Comments)   REACTION: ? caused left axillary nodules/chest flutter   Hydrochlorothiazide Hives      Medication List    STOP taking these medications   aspirin 81 MG tablet Replaced by:  aspirin 325 MG EC tablet   LEVEMIR FLEXTOUCH 100 UNIT/ML Pen Generic  drug:  Insulin Detemir Replaced by:  insulin detemir 100 UNIT/ML injection     TAKE these medications   aspirin 325 MG EC tablet Take 1 tablet (325 mg total) by mouth daily. Start taking on:  03/11/2018 Replaces:  aspirin 81 MG tablet   dapagliflozin propanediol 5 MG Tabs tablet Commonly known as:  FARXIGA Take 5 mg by mouth daily.   glucose blood test strip Commonly known as:  PRODIGY NO CODING BLOOD GLUC Use as instructed to check blood sugar twice a day.  DX  E11.40   insulin aspart 100 UNIT/ML FlexPen Commonly known as:  NOVOLOG FLEXPEN INJECT 11-15 SUBCUTANEOUSLY DAILY BEFORE SUPPER What changed:    how much to take  how to take this  when to take this  additional instructions   insulin detemir 100 UNIT/ML injection Commonly known as:  LEVEMIR Inject 0.08 mLs (8 Units total) into the skin 2 (two) times daily. Replaces:  LEVEMIR FLEXTOUCH 100 UNIT/ML Pen   Insulin Pen Needle 31G X 8 MM Misc Commonly known as:  B-D ULTRAFINE III SHORT PEN USE AS DIRECTED   lisinopril 20 MG tablet Commonly known as:  PRINIVIL,ZESTRIL Take 1 tablet (20 mg total) by mouth daily. Start taking on:  03/11/2018   metFORMIN 500 MG 24 hr tablet Commonly known as:  GLUCOPHAGE-XR Take 2 tablets (1,000 mg total) by  mouth 2 (two) times daily.   metoprolol tartrate 50 MG tablet Commonly known as:  LOPRESSOR Take 1 tablet (50 mg total) by mouth 2 (two) times daily.   oxyCODONE 5 MG immediate release tablet Commonly known as:  Oxy IR/ROXICODONE Take 1-2 tablets (5-10 mg total) by mouth every 6 (six) hours as needed for up to 5 days for severe pain.   rosuvastatin 20 MG tablet Commonly known as:  CRESTOR TAKE 1 TABLET BY MOUTH EVERY DAY   VITAMIN B-12 PO Take 1 tablet by mouth daily.            Durable Medical Equipment  (From admission, onward)        Start     Ordered   03/10/18 1022  For home use only DME oxygen  Once    Question Answer Comment  Mode or (Route) Nasal  cannula   Liters per Minute 2   Frequency Continuous (stationary and portable oxygen unit needed)   Oxygen delivery system Gas      03/10/18 1021      The patient has been discharged on:   1.Beta Blocker:  Yes [  y ]                              No   [   ]                              If No, reason:  2.Ace Inhibitor/ARB: Yes [  y ]                                     No  [    ]                                     If No, reason:  3.Statin:   Yes [ y  ]                  No  [   ]                  If No, reason:  4.Ecasa:  Yes  [ y  ]                  No   [   ]                  If No, reason: Signed: [name] Giovanni PA-C 03/10/2018, 11:31 AM   PAGER [phone]

## 2018-03-06 NOTE — Progress Notes (Signed)
2 Days Post-Op Procedure(s) (LRB): AORTIC VALVE REPLACEMENT (AVR) 43mm [employer]icist, medical Valve. (N/A) CORONARY ARTERY BYPASS GRAFTING (CABG) x1:  LIMA to LAD. (N/A) TRANSESOPHAGEAL ECHOCARDIOGRAM (TEE) (N/A) Subjective: No complaints this AM  Objective: Vital signs in last 24 hours: Temp:  [97.7 F (36.5 C)-99.5 F (37.5 C)] 97.7 F (36.5 C) (07/12 0733) Pulse Rate:  [66-96] 95 (07/12 0500) Cardiac Rhythm: Normal sinus rhythm (07/11 1958) Resp:  [15-32] 32 (07/12 0600) BP: (105-161)/(38-149) 146/132 (07/12 0600) SpO2:  [88 %-98 %] 95 % (07/12 0500) Arterial Line BP: (96-150)/(51-65) 150/65 (07/11 1200) Weight:  [184 lb 11.2 oz (83.8 kg)] 184 lb 11.2 oz (83.8 kg) (07/12 0500)  Hemodynamic parameters for last 24 hours: PAP: (41-60)/(21-27) 50/23 CO:  [5.9 L/min-6.1 L/min] 5.9 L/min CI:  [2.8 L/min/m2-2.9 L/min/m2] 2.8 L/min/m2  Intake/Output from previous day: 07/11 0701 - 07/12 0700 In: 1206 [P.O.:670; I.V.:336.2; IV Piggyback:199.7] Out: 2065 [Urine:1975; Chest Tube:90] Intake/Output this shift: Total I/O In: 240 [P.O.:240] Out: -   General appearance: alert, cooperative and no distress Neurologic: intact Heart: regular rate and rhythm Lungs: diminished breath sounds bilaterally Abdomen: normal findings: soft, non-tender  Lab Results: Recent Labs    03/05/18 1734 03/06/18 0327  WBC 14.0* 14.0*  HGB 11.1* 10.5*  HCT 37.0* 35.3*  PLT 251 233   BMET:  Recent Labs    03/05/18 0358 03/05/18 1721 03/05/18 1734 03/06/18 0327  NA 144 142  --  141  K 4.2 4.3  --  4.5  CL 108 103  --  106  CO2 25  --   --  27  GLUCOSE 118* 150*  --  91  BUN 11 13  --  14  CREATININE 0.90 0.80 0.94 0.97  CALCIUM 8.5*  --   --  8.5*    PT/INR:  Recent Labs    03/04/18 1353  LABPROT 20.1*  INR 1.73   ABG    Component Value Date/Time   PHART 7.315 (L) 03/04/2018 2123   HCO3 23.9 03/04/2018 2123   TCO2 26 03/05/2018 1721   ACIDBASEDEF 2.0 03/04/2018 2123   O2SAT 92.0 03/04/2018 2123   CBG (last 3)  Recent Labs    03/05/18 2050 03/05/18 2336 03/06/18 0326  GLUCAP 130* 99 87    Assessment/Plan: S/P Procedure(s) (LRB): AORTIC VALVE REPLACEMENT (AVR) 23mm [employer] Physicist, medical Valve. (N/A) CORONARY ARTERY BYPASS GRAFTING (CABG) x1:  LIMA to LAD. (N/A) TRANSESOPHAGEAL ECHOCARDIOGRAM (TEE) (N/A) Plan for transfer to step-down: see transfer orders  Doing well POD # 2  CV- s/p AVR CABG  In SR  Continue ASA, beta blocker, statin, consider ARB prior to dc    RESP_ continue IS  RENAL- creatinine and lytes OK, weight minimally up  ENDO- CBG better overnight, resume metformin, decrease levemir to 20 BID  Continue cardiac rehab   LOS: 2 days    [name] 03/06/2018

## 2018-03-06 NOTE — Progress Notes (Signed)
Patient w 10-15 second run of SVT vs V tach with a few shorter bursts of same in preceding minutes. Asymptomatic during all episodes and denies any history or knowledge of past occurrences. Notified Dr Servando Snare, on call and patient resting with family at bedside. Agrees to not get up without assist.

## 2018-03-06 NOTE — Plan of Care (Signed)
Patient offers no complaints at this time.  States that he feels better now that the chest tubes have been removed.Will continue to monitor.

## 2018-03-06 NOTE — Progress Notes (Signed)
CARDIAC REHAB PHASE I   PRE:  Rate/Rhythm: 56 SR  BP:  Sitting: 146/91      SaO2: 95 3L  MODE:  Ambulation: 150 ft 102 peak HR  POST:  Rate/Rhythm: 92 SR  BP:  Sitting: 168/90    SaO2: 97 3L   Pt ambulated 172ft in hallway assist of one with front wheel rolling walker. Pt c/o SOB, coached through pursed lipped breathing. Reviewed sternal precautions with pt and wife, in-the-tube sheet given. Pt demonstrated 500 on IS, encouraged to use 10x/h while awake. Pt instructed to get 3 walks/day over the weekend. Will continue to follow.  [phone] Rufina Falco, RN BSN 03/06/2018 1:47 PM

## 2018-03-07 DIAGNOSIS — I480 Paroxysmal atrial fibrillation: Secondary | ICD-10-CM

## 2018-03-07 LAB — BASIC METABOLIC PANEL
ANION GAP: 10 (ref 5–15)
BUN: 14 mg/dL (ref 8–23)
CHLORIDE: 104 mmol/L (ref 98–111)
CO2: 27 mmol/L (ref 22–32)
CREATININE: 0.86 mg/dL (ref 0.61–1.24)
Calcium: 8.7 mg/dL — ABNORMAL LOW (ref 8.9–10.3)
GFR calc non Af Amer: >60 mL/min (ref >60)
Glucose, Bld: 82 mg/dL (ref 70–99)
POTASSIUM: 4.3 mmol/L (ref 3.5–5.1)
Sodium: 141 mmol/L (ref 135–145)

## 2018-03-07 LAB — CBC
HEMATOCRIT: 35 % — AB (ref 39.0–52.0)
HEMOGLOBIN: 10.6 g/dL — AB (ref 13.0–17.0)
MCH: 22.9 pg — ABNORMAL LOW (ref 26.0–34.0)
MCHC: 30.3 g/dL (ref 30.0–36.0)
MCV: 75.8 fL — ABNORMAL LOW (ref 78.0–100.0)
Platelets: 287 10*3/uL (ref 150–400)
RBC: 4.62 MIL/uL (ref 4.22–5.81)
RDW: 15.2 % (ref 11.5–15.5)
WBC: 13.8 10*3/uL — AB (ref 4.0–10.5)

## 2018-03-07 LAB — GLUCOSE, CAPILLARY
GLUCOSE-CAPILLARY: 124 mg/dL — AB (ref 70–99)
GLUCOSE-CAPILLARY: 95 mg/dL (ref 70–99)
Glucose-Capillary: 66 mg/dL — ABNORMAL LOW (ref 70–99)
Glucose-Capillary: 236 mg/dL — ABNORMAL HIGH (ref 70–99)
Glucose-Capillary: 116 mg/dL — ABNORMAL HIGH (ref 70–99)

## 2018-03-07 LAB — MAGNESIUM: Magnesium: 2.1 mg/dL (ref 1.7–2.4)

## 2018-03-07 MED ORDER — INSULIN DETEMIR 100 UNIT/ML ~~LOC~~ SOLN
18.0000 [IU] | Freq: Two times a day (BID) | SUBCUTANEOUS | Status: DC
  Administered 2018-03-07 – 2018-03-08 (×2): 18 [IU] via SUBCUTANEOUS
  Filled 2018-03-07 (×2): qty 0.18

## 2018-03-07 MED ORDER — METOPROLOL TARTRATE 25 MG/10 ML ORAL SUSPENSION: 12.5000 mg | Freq: Two times a day (BID) | ORAL | Status: DC

## 2018-03-07 MED ORDER — METOPROLOL TARTRATE 25 MG PO TABS
25.0000 mg | ORAL_TABLET | Freq: Two times a day (BID) | ORAL | Status: DC
  Administered 2018-03-07 (×2): 25 mg via ORAL
  Filled 2018-03-07 (×2): qty 1

## 2018-03-07 MED ORDER — METOPROLOL TARTRATE 5 MG/5ML IV SOLN
2.5000 mg | INTRAVENOUS | Status: DC | PRN
  Administered 2018-03-07: 5 mg via INTRAVENOUS
  Filled 2018-03-07: qty 5

## 2018-03-07 NOTE — Progress Notes (Signed)
CARDIAC REHAB PHASE I   PRE:  Rate/Rhythm: 97 sinus rhythm  BP:  Supine:   Sitting: 131/82  Standing:    SaO2: 92% 2L  MODE:  Ambulation: 400 ft   POST:  Rate/Rhythem: 102 sinus tach  BP:  Supine:   Sitting: 131/78  Standing:  1021-1055  SaO2: 91% 2L  Pt ambulated in hallway x1 assist using rolling walker.  Steady gait, asymptomatic. Pt returned to chair, call light in reach. Pt demonstrated IS use 1073ml.  Pt instructed to use IS 5x every 30 minutes. Pt instructed in sternal precautions utilizing Heart pillow n the Tube techniques, hand out given.    [employer]

## 2018-03-07 NOTE — Progress Notes (Addendum)
[address]       [place] [address]             [phone]      3 Days Post-Op Procedure(s) (LRB): AORTIC VALVE REPLACEMENT (AVR) 75mm Edwards Physicist, medical Valve. (N/A) CORONARY ARTERY BYPASS GRAFTING (CABG) x1:  LIMA to LAD. (N/A) TRANSESOPHAGEAL ECHOCARDIOGRAM (TEE) (N/A) Subjective: The patient had no symptoms with his arrhythmia this morning. Continues to be asymptomatic.   Objective: Vital signs in last 24 hours: Temp:  [97.9 F (36.6 C)-99 F (37.2 C)] 97.9 F (36.6 C) (07/13 0808) Pulse Rate:  [93-98] 94 (07/13 0808) Cardiac Rhythm: Sinus tachycardia (07/13 0700) Resp:  [20-30] 25 (07/13 0808) BP: (130-162)/(77-98) 137/77 (07/13 0808) SpO2:  [96 %-100 %] 96 % (07/13 0808) Weight:  [81.6 kg (179 lb 14.3 oz)] 81.6 kg (179 lb 14.3 oz) (07/13 0442)    Intake/Output from previous day: 07/12 0701 - 07/13 0700 In: 483.2 [P.O.:462; I.V.:21.2] Out: 850 [Urine:850] Intake/Output this shift: No intake/output data recorded.  General appearance: alert, cooperative and no distress Heart: sinus tachycardia Lungs: clear to auscultation bilaterally Abdomen: soft, non-tender; bowel sounds normal; no masses,  no organomegaly Extremities: extremities normal, atraumatic, no cyanosis or edema Wound: clean and dry  Lab Results: Recent Labs    03/06/18 0327 03/07/18 0330  WBC 14.0* 13.8*  HGB 10.5* 10.6*  HCT 35.3* 35.0*  PLT 233 287   BMET:  Recent Labs    03/06/18 0327 03/07/18 0330  NA 141 141  K 4.5 4.3  CL 106 104  CO2 27 27  GLUCOSE 91 82  BUN 14 14  CREATININE 0.97 0.86  CALCIUM 8.5* 8.7*    PT/INR:  Recent Labs    03/04/18 1353  LABPROT 20.1*  INR 1.73   ABG    Component Value Date/Time   PHART 7.315 (L) 03/04/2018 2123   HCO3 23.9 03/04/2018 2123   TCO2 26 03/05/2018 1721   ACIDBASEDEF 2.0 03/04/2018 2123   O2SAT 92.0 03/04/2018 2123   CBG (last 3)  Recent Labs    03/06/18 2015 03/07/18 0612 03/07/18 0720    GLUCAP 112* 66* 236*    Assessment/Plan: S/P Procedure(s) (LRB): AORTIC VALVE REPLACEMENT (AVR) 27mm Edwards Physicist, medical Valve. (N/A) CORONARY ARTERY BYPASS GRAFTING (CABG) x1:  LIMA to LAD. (N/A) TRANSESOPHAGEAL ECHOCARDIOGRAM (TEE) (N/A)  1. CV-SVT vs. V-tach noted on tele. Increase Metoprolol. BP is well controlled on Lisinopril. Continue Crestor. 2. Pulm-tolerating 2L [place] with good oxygen saturation. CXR yesterday showed mild atelectasis with possible minimal pleural effusions.  3. Renal-creatinine 0.86, electrolytes okay. Will order a Mag level due to arrhythmias 4. H and H 10.6/35.0, platelets 287k 5. Endo-has been well controlled other than one outlier this morning. On Metformin and Invokana.  Will decrease Levemir  6. Continue cardiac rehab. Up with assistance.   Plan: Increase Lopressor for better rhythm control. While I was in the room he did get into the 120s. Added PRN Lopressor IV order as well. Up with assistance-reviewed this with the patient. Wean oxygen as tolerated and continue to use incentive spirometer.    LOS: 3 days    Elgie Collard 03/07/2018  Brief episode of afib with rvr last night, reviewed with Dr Radford Pax - cardiology was to see  Labs ok, ekg qtc 467 ekg with incomplete lbbb, mag ok  Watch Invokana- associated with euglycemic ketoacidosis  I have seen and examined [name] and agree with the above  assessment  and plan.  Grace Isaac MD Beeper [phone] Office [phone] 03/07/2018 10:51 AM

## 2018-03-07 NOTE — Progress Notes (Signed)
Progress Note  Patient Name: [name] Date of Encounter: 03/07/2018  Primary Cardiologist: Agustin Cree   Subjective   No chest pain or sob. Felt palpitations earlier today  Inpatient Medications    Scheduled Meds: . acetaminophen  1,000 mg Oral Q6H   Or  . acetaminophen (TYLENOL) oral liquid 160 mg/5 mL  1,000 mg Per Tube Q6H  . aspirin EC  325 mg Oral Daily   Or  . aspirin  324 mg Per Tube Daily  . bisacodyl  10 mg Oral Daily   Or  . bisacodyl  10 mg Rectal Daily  . canagliflozin  100 mg Oral QAC breakfast  . docusate sodium  200 mg Oral Daily  . enoxaparin (LOVENOX) injection  40 mg Subcutaneous QHS  . insulin aspart  0-15 Units Subcutaneous TID WC  . insulin detemir  18 Units Subcutaneous BID  . lisinopril  10 mg Oral Daily  . metFORMIN  1,000 mg Oral BID WC  . metoprolol tartrate  25 mg Oral BID  . moving right along book   Does not apply Once  . pantoprazole  40 mg Oral Daily  . rosuvastatin  20 mg Oral Daily  . sodium chloride flush  3 mL Intravenous Q12H   Continuous Infusions: . sodium chloride     PRN Meds: sodium chloride, alum & mag hydroxide-simeth, magnesium hydroxide, metoprolol tartrate, ondansetron (ZOFRAN) IV, oxyCODONE, sodium chloride flush, traMADol   Vital Signs    Vitals:   03/06/18 2157 03/07/18 0425 03/07/18 0442 03/07/18 0808  BP: (!) 146/85 (!) 162/98  137/77  Pulse: 98   94  Resp: 20   (!) 25  Temp: 98.9 F (37.2 C) 98.8 F (37.1 C)  97.9 F (36.6 C)  TempSrc: Oral Oral  Oral  SpO2: 100%   96%  Weight:   179 lb 14.3 oz (81.6 kg)   Height:        Intake/Output Summary (Last 24 hours) at 03/07/2018 1208 Last data filed at 03/07/2018 0830 Gross per 24 hour  Intake 342 ml  Output 550 ml  Net -208 ml   Filed Weights   03/05/18 0500 03/06/18 0500 03/07/18 0442  Weight: 181 lb 4 oz (82.2 kg) 184 lb 11.2 oz (83.8 kg) 179 lb 14.3 oz (81.6 kg)    Telemetry    nsr with PAF with RVR - Personally Reviewed  ECG    nsr  - Personally Reviewed  Physical Exam   GEN: No acute distress.   Neck: 6 cm JVD Cardiac: RRR, no murmurs, rubs, or gallops.  Respiratory: Clear to auscultation bilaterally. GI: Soft, nontender, non-distended  MS: No edema; No deformity. Neuro:  Nonfocal  Psych: Normal affect   Labs    Chemistry Recent Labs  Lab 03/02/18 1502  03/05/18 0358 03/05/18 1721 03/05/18 1734 03/06/18 0327 03/07/18 0330  NA 139   < > 144 142  --  141 141  K 4.4   < > 4.2 4.3  --  4.5 4.3  CL 109   < > 108 103  --  106 104  CO2 23  --  25  --   --  27 27  GLUCOSE 91   < > 118* 150*  --  91 82  BUN 12   < > 11 13  --  14 14  CREATININE 0.78   < > 0.90 0.80 0.94 0.97 0.86  CALCIUM 9.2  --  8.5*  --   --  8.5* 8.7*  PROT 7.0  --   --   --   --   --   --   ALBUMIN 3.8  --   --   --   --   --   --   AST 15  --   --   --   --   --   --   ALT 10  --   --   --   --   --   --   ALKPHOS 44  --   --   --   --   --   --   BILITOT 0.6  --   --   --   --   --   --   GFRNONAA >60   < > >60  --  >60 >60 >60  GFRAA >60   < > >60  --  >60 >60 >60  ANIONGAP 7  --  11  --   --  8 10   < > = values in this interval not displayed.     Hematology Recent Labs  Lab 03/05/18 1734 03/06/18 0327 03/07/18 0330  WBC 14.0* 14.0* 13.8*  RBC 4.86 4.59 4.62  HGB 11.1* 10.5* 10.6*  HCT 37.0* 35.3* 35.0*  MCV 76.1* 76.9* 75.8*  MCH 22.8* 22.9* 22.9*  MCHC 30.0 29.7* 30.3  RDW 15.6* 15.7* 15.2  PLT 251 233 287    Cardiac EnzymesNo results for input(s): TROPONINI in the last 168 hours. No results for input(s): TROPIPOC in the last 168 hours.   BNPNo results for input(s): BNP, PROBNP in the last 168 hours.   DDimer No results for input(s): DDIMER in the last 168 hours.   Radiology    Dg Chest Port 1 View  Result Date: 03/06/2018 CLINICAL DATA:  Status post aortic valve replacement. EXAM: PORTABLE CHEST 1 VIEW COMPARISON:  Radiograph of March 05, 2018. FINDINGS: Stable cardiomediastinal silhouette. Aortic valve  prosthesis is noted. Right internal jugular venous sheath remains. No pneumothorax is noted. Mild bibasilar subsegmental atelectasis is noted with possible minimal pleural effusions. Bony thorax is unremarkable. IMPRESSION: Mild bibasilar subsegmental atelectasis with possible minimal pleural effusions. Electronically Signed   By: Marijo Conception, M.D.   On: 03/06/2018 08:50    Cardiac Studies   none  Patient Profile     67 y.o. male s/p AVR  Assessment & Plan    1. Post op atrial fib - he has a rapid VR with abherrancy but the morphology of the beats and IRIR despite very rapid rates suggests that this is atrial fib and not VT. Consider increasing his beta blocker if his blood pressure allows. Only use amio if he has sustained atrial fib.      For questions or updates, please contact [place] Please consult [email or web address] for contact info under Cardiology/STEMI.      Signed, Cristopher Peru, MD  03/07/2018, 12:08 PM  Patient ID: [name], male   DOB: 1951-04-02, 66 y.o.   MRN: [id number]

## 2018-03-08 LAB — BASIC METABOLIC PANEL
Anion gap: 8 (ref 5–15)
BUN: 13 mg/dL (ref 8–23)
CO2: 30 mmol/L (ref 22–32)
Calcium: 8.9 mg/dL (ref 8.9–10.3)
Chloride: 107 mmol/L (ref 98–111)
Creatinine, Ser: 0.77 mg/dL (ref 0.61–1.24)
GFR calc Af Amer: >60 mL/min (ref >60)
GFR calc non Af Amer: >60 mL/min (ref >60)
Glucose, Bld: 64 mg/dL — ABNORMAL LOW (ref 70–99)
Potassium: 4.2 mmol/L (ref 3.5–5.1)
Sodium: 145 mmol/L (ref 135–145)

## 2018-03-08 LAB — CBC
HCT: 36.4 % — ABNORMAL LOW (ref 39.0–52.0)
Hemoglobin: 11 g/dL — ABNORMAL LOW (ref 13.0–17.0)
MCH: 22.7 pg — ABNORMAL LOW (ref 26.0–34.0)
MCHC: 30.2 g/dL (ref 30.0–36.0)
MCV: 75.2 fL — ABNORMAL LOW (ref 78.0–100.0)
Platelets: 361 10*3/uL (ref 150–400)
RBC: 4.84 MIL/uL (ref 4.22–5.81)
RDW: 15.1 % (ref 11.5–15.5)
WBC: 11.8 10*3/uL — ABNORMAL HIGH (ref 4.0–10.5)

## 2018-03-08 LAB — GLUCOSE, CAPILLARY
GLUCOSE-CAPILLARY: 108 mg/dL — AB (ref 70–99)
GLUCOSE-CAPILLARY: 108 mg/dL — AB (ref 70–99)
GLUCOSE-CAPILLARY: 131 mg/dL — AB (ref 70–99)
Glucose-Capillary: 104 mg/dL — ABNORMAL HIGH (ref 70–99)

## 2018-03-08 LAB — MAGNESIUM: Magnesium: 2 mg/dL (ref 1.7–2.4)

## 2018-03-08 MED ORDER — METOPROLOL TARTRATE 50 MG PO TABS
50.0000 mg | ORAL_TABLET | Freq: Two times a day (BID) | ORAL | Status: DC
  Administered 2018-03-08 – 2018-03-10 (×5): 50 mg via ORAL
  Filled 2018-03-08 (×5): qty 1

## 2018-03-08 MED ORDER — INSULIN DETEMIR 100 UNIT/ML ~~LOC~~ SOLN
15.0000 [IU] | Freq: Two times a day (BID) | SUBCUTANEOUS | Status: DC
  Administered 2018-03-08 – 2018-03-09 (×2): 15 [IU] via SUBCUTANEOUS
  Filled 2018-03-08 (×2): qty 0.15

## 2018-03-08 NOTE — Progress Notes (Signed)
Pt rolled on the left side to urinate, HR jumped up to 212 beats/min unsustanable, pt complained of funny feeling in his chest. 02 sat 97 with 3l o2 via [place]. Denied SOB. Prn iv metoprolol administered, see Mar for documentation. HR kept running anywhere from 100-178's frequently during administration of medication. Funny feeling in the chest went away as HR came down. Call light within reach. Will continue to monitor.

## 2018-03-08 NOTE — Progress Notes (Addendum)
      [address]       [place] [address]             [phone]      4 Days Post-Op Procedure(s) (LRB): AORTIC VALVE REPLACEMENT (AVR) 72mm Edwards Physicist, medical Valve. (N/A) CORONARY ARTERY BYPASS GRAFTING (CABG) x1:  LIMA to LAD. (N/A) TRANSESOPHAGEAL ECHOCARDIOGRAM (TEE) (N/A) Subjective: Feels okay this morning. No symptoms at the time of my interview.   Objective: Vital signs in last 24 hours: Temp:  [97.8 F (36.6 C)-98.4 F (36.9 C)] 97.8 F (36.6 C) (07/14 0747) Pulse Rate:  [94-102] 95 (07/14 0435) Cardiac Rhythm: Normal sinus rhythm (07/14 0700) Resp:  [18-26] 26 (07/14 0435) BP: (128-155)/(77-105) 145/94 (07/14 0747) SpO2:  [92 %-99 %] 96 % (07/14 0435) Weight:  [80.5 kg (177 lb 7.5 oz)] 80.5 kg (177 lb 7.5 oz) (07/14 0430)     Intake/Output from previous day: 07/13 0701 - 07/14 0700 In: 530 [P.O.:530] Out: 500 [Urine:500] Intake/Output this shift: Total I/O In: -  Out: 300 [Urine:300]  General appearance: alert, cooperative and no distress Heart: regular rate and rhythm, S1, S2 normal, no murmur, click, rub or gallop Lungs: clear to auscultation bilaterally Abdomen: soft, non-tender; bowel sounds normal; no masses,  no organomegaly Extremities: extremities normal, atraumatic, no cyanosis or edema Wound: clean and dry  Lab Results: Recent Labs    03/07/18 0330 03/08/18 0538  WBC 13.8* 11.8*  HGB 10.6* 11.0*  HCT 35.0* 36.4*  PLT 287 361   BMET:  Recent Labs    03/07/18 0330 03/08/18 0538  NA 141 145  K 4.3 4.2  CL 104 107  CO2 27 30  GLUCOSE 82 64*  BUN 14 13  CREATININE 0.86 0.77  CALCIUM 8.7* 8.9    PT/INR: No results for input(s): LABPROT, INR in the last 72 hours. ABG    Component Value Date/Time   PHART 7.315 (L) 03/04/2018 2123   HCO3 23.9 03/04/2018 2123   TCO2 26 03/05/2018 1721   ACIDBASEDEF 2.0 03/04/2018 2123   O2SAT 92.0 03/04/2018 2123   CBG (last 3)  Recent Labs    03/07/18 1638  03/07/18 2055 03/08/18 0638  GLUCAP 124* 95 108*    Assessment/Plan: S/P Procedure(s) (LRB): AORTIC VALVE REPLACEMENT (AVR) 74mm Edwards Physicist, medical Valve. (N/A) CORONARY ARTERY BYPASS GRAFTING (CABG) x1:  LIMA to LAD. (N/A) TRANSESOPHAGEAL ECHOCARDIOGRAM (TEE) (N/A)  1. CV-SVT vs. V-tach noted on tele. Increase Metoprolol. BP is well controlled on Lisinopril. Continue Crestor. 2. Pulm-tolerating 2L [place] with good oxygen saturation. No new CXR.  3. Renal-creatinine 0.77, electrolytes okay. Mag 2.0, potassium 4.2 4. H and H 11.0/36.4, platelets 381k 5. Endo-has been well controlled other than one outlier this morning. On Metformin and Invokana.  Will decrease Levemir  6. Continue cardiac rehab. Up with assistance.  7. + BM  Plan: Increase BB to 50mg  BID. Cardiology following to assist in rhythm management. Wean oxygen as tolerated. Ambulate in the halls. Keep wires today.    LOS: 4 days    Elgie Collard 03/08/2018  Feels better today , holding sinus  I have seen and examined [name] and agree with the above assessment  and plan.  Grace Isaac MD [facility] [phone] Office [phone] 03/08/2018 12:56 PM

## 2018-03-09 ENCOUNTER — Other Ambulatory Visit: Payer: Self-pay

## 2018-03-09 ENCOUNTER — Encounter (HOSPITAL_COMMUNITY): Payer: Self-pay | Admitting: General Practice

## 2018-03-09 DIAGNOSIS — Z952 Presence of prosthetic heart valve: Secondary | ICD-10-CM

## 2018-03-09 LAB — GLUCOSE, CAPILLARY
GLUCOSE-CAPILLARY: 207 mg/dL — AB (ref 70–99)
GLUCOSE-CAPILLARY: 121 mg/dL — AB (ref 70–99)
Glucose-Capillary: 87 mg/dL (ref 70–99)
Glucose-Capillary: 119 mg/dL — ABNORMAL HIGH (ref 70–99)

## 2018-03-09 MED ORDER — INSULIN DETEMIR 100 UNIT/ML ~~LOC~~ SOLN
8.0000 [IU] | Freq: Two times a day (BID) | SUBCUTANEOUS | Status: DC
  Administered 2018-03-09 – 2018-03-10 (×2): 8 [IU] via SUBCUTANEOUS
  Filled 2018-03-09 (×3): qty 0.08

## 2018-03-09 MED ORDER — FUROSEMIDE 40 MG PO TABS
40.0000 mg | ORAL_TABLET | Freq: Once | ORAL | Status: AC
Start: 2018-03-09 — End: 2018-03-09
  Administered 2018-03-09: 40 mg via ORAL
  Filled 2018-03-09: qty 1

## 2018-03-09 MED ORDER — POTASSIUM CHLORIDE CRYS ER 20 MEQ PO TBCR
20.0000 meq | EXTENDED_RELEASE_TABLET | Freq: Once | ORAL | Status: AC
  Administered 2018-03-09: 20 meq via ORAL
  Filled 2018-03-09: qty 1

## 2018-03-09 NOTE — Progress Notes (Signed)
Progress Note  Patient Name: [name] Date of Encounter: 03/09/2018  Primary Cardiologist: Agustin Cree   Subjective   No complaints sitting in chair   Inpatient Medications    Scheduled Meds: . acetaminophen  1,000 mg Oral Q6H   Or  . acetaminophen (TYLENOL) oral liquid 160 mg/5 mL  1,000 mg Per Tube Q6H  . aspirin EC  325 mg Oral Daily   Or  . aspirin  324 mg Per Tube Daily  . bisacodyl  10 mg Oral Daily   Or  . bisacodyl  10 mg Rectal Daily  . canagliflozin  100 mg Oral QAC breakfast  . docusate sodium  200 mg Oral Daily  . enoxaparin (LOVENOX) injection  40 mg Subcutaneous QHS  . insulin aspart  0-15 Units Subcutaneous TID WC  . insulin detemir  15 Units Subcutaneous BID  . lisinopril  10 mg Oral Daily  . metFORMIN  1,000 mg Oral BID WC  . metoprolol tartrate  50 mg Oral BID  . moving right along book   Does not apply Once  . pantoprazole  40 mg Oral Daily  . rosuvastatin  20 mg Oral Daily  . sodium chloride flush  3 mL Intravenous Q12H   Continuous Infusions: . sodium chloride     PRN Meds: sodium chloride, alum & mag hydroxide-simeth, magnesium hydroxide, metoprolol tartrate, ondansetron (ZOFRAN) IV, oxyCODONE, sodium chloride flush, traMADol   Vital Signs    Vitals:   03/08/18 2104 03/08/18 2211 03/09/18 0418 03/09/18 0740  BP: (!) 131/96  (!) 144/94 (!) 151/80  Pulse: (!) 56 (!) 104 83 80  Resp: (!) 24  18 (!) 23  Temp: 98.7 F (37.1 C)  98.7 F (37.1 C) 98.3 F (36.8 C)  TempSrc: Oral  Oral Oral  SpO2: 99%  99% 98%  Weight:   174 lb 3.2 oz (79 kg)   Height:        Intake/Output Summary (Last 24 hours) at 03/09/2018 0925 Last data filed at 03/09/2018 0539 Gross per 24 hour  Intake 1060 ml  Output 2125 ml  Net -1065 ml   Filed Weights   03/07/18 0442 03/08/18 0430 03/09/18 0418  Weight: 179 lb 14.3 oz (81.6 kg) 177 lb 7.5 oz (80.5 kg) 174 lb 3.2 oz (79 kg)    Telemetry    NSR morphology LVH   ECG    NSR LVH   Physical Exam     Affect appropriate Thin black male  HEENT: normal Neck supple with no adenopathy JVP normal no bruits no thyromegaly Lungs clear with no wheezing and good diaphragmatic motion Heart:  S1/S2 SEM through AVR no AR  murmur, no rub, gallop or click PMI normal  Post sternotomy  Abdomen: benighn, BS positve, no tenderness, no AAA no bruit.  No HSM or HJR Distal pulses intact with no bruits No edema Neuro non-focal Skin warm and dry No muscular weakness   Labs    Chemistry Recent Labs  Lab 03/02/18 1502  03/06/18 0327 03/07/18 0330 03/08/18 0538  NA 139   < > 141 141 145  K 4.4   < > 4.5 4.3 4.2  CL 109   < > 106 104 107  CO2 23   < > 27 27 30   GLUCOSE 91   < > 91 82 64*  BUN 12   < > 14 14 13   CREATININE 0.78   < > 0.97 0.86 0.77  CALCIUM 9.2   < > 8.5*  8.7* 8.9  PROT 7.0  --   --   --   --   ALBUMIN 3.8  --   --   --   --   AST 15  --   --   --   --   ALT 10  --   --   --   --   ALKPHOS 44  --   --   --   --   BILITOT 0.6  --   --   --   --   GFRNONAA >60   < > >60 >60 >60  GFRAA >60   < > >60 >60 >60  ANIONGAP 7   < > 8 10 8    < > = values in this interval not displayed.     Hematology Recent Labs  Lab 03/06/18 0327 03/07/18 0330 03/08/18 0538  WBC 14.0* 13.8* 11.8*  RBC 4.59 4.62 4.84  HGB 10.5* 10.6* 11.0*  HCT 35.3* 35.0* 36.4*  MCV 76.9* 75.8* 75.2*  MCH 22.9* 22.9* 22.7*  MCHC 29.7* 30.3 30.2  RDW 15.7* 15.2 15.1  PLT 233 287 361     Radiology    No results found.  Cardiac Studies   none  Patient Profile     67 y.o. male s/p AVR for severe AS and bicuspid AV   Assessment & Plan    1. Post op atrial fib - resolved on bid lopressor 50 mg see note from Dr Lovena Le avoid Amiodarone if possible. No anticoagulation  2. AVR:  SEM SBE prophylaxis baseline TTE in 4 weeks post op for EF and gradients 3. HLD:  Continue statin   Will arrange outpatient f/u with Agustin Cree   For questions or updates, please contact [place] Please  consult [email or web address] for contact info under Cardiology/STEMI.      Signed, Jenkins Rouge, MD  03/09/2018, 9:25 AM  Patient ID: [name], male   DOB: 1951/03/25, 67 y.o.   MRN: [id number]

## 2018-03-09 NOTE — Progress Notes (Signed)
CARDIAC REHAB PHASE I   PRE:  Rate/Rhythm: 84 SR  BP:  Sitting: 132/80      SaO2: 90 RA  MODE:  Ambulation: 350 ft   POST:  Rate/Rhythm: 96 SR  BP:  Sitting: 134/81    SaO2: 81 RA --> 98 on 2L   Pt ambulated 38ft in hallway, standby assist with front wheel rolling walker. Pt desated to 81 on room air, placed on 2L and pts sats increased to 98. Pt and wife educated on showering and monitoring incisions daily, and when to call the MD. Pt demonstrates 1000 on IS, encouraged continued use in the hospital and post d/c. Reviewed sternal precautions. Pt and wife given heart healthy and diabetic diets. Reviewed restrictions and exercise guidelines. Will refer to CRP II [place] Rufina Falco, RN BSN 03/09/2018 3:05 PM

## 2018-03-09 NOTE — Progress Notes (Signed)
Pulled patient pacer wires. Vital signs are stable and patient is alert and oriented. Wires came out with no complications,  intact with no tissue attached. Patient tolerated removal well. Removal area is clean dry and intact. Patient will remain on bed rest for 1 hour.

## 2018-03-09 NOTE — Progress Notes (Addendum)
      [address]       [place] [address]             [phone]        5 Days Post-Op Procedure(s) (LRB): AORTIC VALVE REPLACEMENT (AVR) 22mm Edwards Magna Ease Tissue Valve. (N/A) CORONARY ARTERY BYPASS GRAFTING (CABG) x1:  LIMA to LAD. (N/A) TRANSESOPHAGEAL ECHOCARDIOGRAM (TEE) (N/A)  Subjective: Patient just finishing breakfast. He has no specific complaints this am.  Objective: Vital signs in last 24 hours: Temp:  [97.6 F (36.4 C)-98.7 F (37.1 C)] 98.3 F (36.8 C) (07/15 0740) Pulse Rate:  [56-104] 80 (07/15 0740) Cardiac Rhythm: Normal sinus rhythm (07/15 0706) Resp:  [18-26] 23 (07/15 0740) BP: (127-151)/(80-96) 151/80 (07/15 0740) SpO2:  [97 %-99 %] 98 % (07/15 0740) Weight:  [174 lb 3.2 oz (79 kg)] 174 lb 3.2 oz (79 kg) (07/15 0418)  Pre op weight 82.5 kg Current Weight  03/09/18 174 lb 3.2 oz (79 kg)      Intake/Output from previous day: 07/14 0701 - 07/15 0700 In: 1300 [P.O.:1300] Out: 2425 [Urine:2425]   Physical Exam:  Cardiovascular: RRR, no murmur Pulmonary: Slightly diminished at bases Abdomen: Soft, non tender, bowel sounds present. Extremities: Trace bilateral lower extremity edema. Wound: Clean and dry.  No erythema or signs of infection.  Lab Results: CBC: Recent Labs    03/07/18 0330 03/08/18 0538  WBC 13.8* 11.8*  HGB 10.6* 11.0*  HCT 35.0* 36.4*  PLT 287 361   BMET:  Recent Labs    03/07/18 0330 03/08/18 0538  NA 141 145  K 4.3 4.2  CL 104 107  CO2 27 30  GLUCOSE 82 64*  BUN 14 13  CREATININE 0.86 0.77  CALCIUM 8.7* 8.9    PT/INR:  Lab Results  Component Value Date   INR 1.73 03/04/2018   INR 1.20 03/02/2018   ABG:  INR: Will add last result for INR, ABG once components are confirmed Will add last 4 CBG results once components are confirmed  Assessment/Plan:  1. CV - SR in the 80-90's and hypertensive. On Lopressor 50 mg bid and Lisinopril 10 mg daily. Will increase Lisinopril to 20  mg daily for better BP control. 2.  Pulmonary - On 2 liters of oxygen. Will wean as able. Encourage incentive spirometer. 3.  Acute blood loss anemia - H and H yesterday 11 and 36.4 4. DM-CBGs 104/131/87. On Insulin, Canagliflozin 100 mg daily, and Metformin XR 1000 mg bid. Will decrease Insulin to avoid hypoglycemia. Pre op HGA1C 7.3 5. Remove EPW 6. Will give Lasix 40 mg orally this am 7. Possibly home in am  [place] 03/09/2018,9:23 AM [phone]  Patient seen and examined, agree with above  Remo Lipps C. Roxan Hockey, MD [facility] [phone]

## 2018-03-09 NOTE — Progress Notes (Signed)
Inpatient Diabetes Program Recommendations  AACE/ADA: New Consensus Statement on Inpatient Glycemic Control (2015)  Target Ranges:  Prepandial:   less than 140 mg/dL      Peak postprandial:   less than 180 mg/dL (1-2 hours)      Critically ill patients:  140 - 180 mg/dL   Lab Results  Component Value Date   GLUCAP 207 (H) 03/09/2018   HGBA1C 7.3 (H) 03/02/2018    Review of Glycemic ControlResults for BALDO, HUFNAGLE (MRN [id number]) as of 03/09/2018 13:27  Ref. Range 03/08/2018 11:54 03/08/2018 16:27 03/08/2018 21:17 03/09/2018 06:46 03/09/2018 11:04  Glucose-Capillary Latest Ref Range: 70 - 99 mg/dL 108 (H) 104 (H) 131 (H) 87 207 (H)    Diabetes history: Type 2 DM  Outpatient Diabetes medications:  Novolog 11 units with supper, Farxiga  5 mg daily, Levemir 60 units q AM, Metformin 1000 mg bid Current orders for Inpatient glycemic control:  Levemir 8 units bid, Novolog moderate tid with meals, Invokana 100 mg daily, Meformin-XR 1000 mg bid Inpatient Diabetes Program Recommendations:   Note that patient is on much less insulin then prior to admit.  May consider adding low dose meal coverage Novolog 3 units tid with meals (hold if patient eats less than 50%).  He will need to continue to monitor blood sugars closely and f/u with PCP.  Thanks, Adah Perl, RN, BC-ADM Inpatient Diabetes Coordinator Pager [phone] (8a-5p)

## 2018-03-09 NOTE — Care Management Important Message (Signed)
Important Message  Patient Details  Name: [name] MRN: [id number] Date of Birth: 1950-12-02   Medicare Important Message Given:  Yes    Mallie Linnemann P Marlow Hendrie 03/09/2018, 3:14 PM

## 2018-03-09 NOTE — Progress Notes (Signed)
SATURATION QUALIFICATIONS: (This note is used to comply with regulatory documentation for home oxygen)  Patient Saturations on Room Air at Rest = 90%  Patient Saturations on Room Air while Ambulating = 81%  Patient Saturations on 2 Liters of oxygen while Ambulating = 98%  Please briefly explain why patient needs home oxygen: pt desats on RA while ambulating and needs supplemental oxygen to maintain sats.

## 2018-03-10 ENCOUNTER — Telehealth: Payer: Self-pay

## 2018-03-10 LAB — GLUCOSE, CAPILLARY
GLUCOSE-CAPILLARY: 106 mg/dL — AB (ref 70–99)
Glucose-Capillary: 150 mg/dL — ABNORMAL HIGH (ref 70–99)

## 2018-03-10 MED ORDER — METOPROLOL TARTRATE 50 MG PO TABS: 50.0000 mg | ORAL_TABLET | Freq: Two times a day (BID) | ORAL | 1 refills | Status: DC

## 2018-03-10 MED ORDER — OXYCODONE HCL 5 MG PO TABS
5.0000 mg | ORAL_TABLET | Freq: Four times a day (QID) | ORAL | 0 refills | Status: AC | PRN
Start: 2018-03-10 — End: 2018-03-15

## 2018-03-10 MED ORDER — ASPIRIN 325 MG PO TBEC: 325.0000 mg | DELAYED_RELEASE_TABLET | Freq: Every day | ORAL | 0 refills | Status: DC

## 2018-03-10 MED ORDER — LISINOPRIL 20 MG PO TABS: 20.0000 mg | ORAL_TABLET | Freq: Every day | ORAL | 1 refills | Status: DC

## 2018-03-10 MED ORDER — INSULIN DETEMIR 100 UNIT/ML ~~LOC~~ SOLN: 8.0000 [IU] | Freq: Two times a day (BID) | SUBCUTANEOUS | 11 refills | Status: DC

## 2018-03-10 MED ORDER — LISINOPRIL 10 MG PO TABS
20.0000 mg | ORAL_TABLET | Freq: Every day | ORAL | Status: DC
  Administered 2018-03-10: 20 mg via ORAL
  Filled 2018-03-10: qty 2

## 2018-03-10 NOTE — Consult Note (Addendum)
            [facility] Primary Care Navigator  03/10/2018  [name] [birth year] [id number]   Martin Majestic tosee patient at the bedside to identify possible discharge needs buthe wasalreadydischarged home per staff.  Per chart review,patientwas admitted for severe aortic stenosis and coronary artery disease, status post aortic valve replacement, coronary bypass grafting x 1)  Primary care provider's office is listed as providing transition of care (TOC) follow-up.  Primary care provider's office called Lanae Boast) to notify of patient's discharge and need for post hospital follow-up and transition of care. Was notified that patient has scheduled appointment to see primary care provider on 03/12/18.   For additional questions please contact:  Edwena Felty A. Jamekia Gannett, BSN, RN-BC [facility] PRIMARY CARE Navigator Cell: [phone]

## 2018-03-10 NOTE — Progress Notes (Addendum)
[address]       [place] [address]             [phone]      6 Days Post-Op Procedure(s) (LRB): AORTIC VALVE REPLACEMENT (AVR) 57mm Edwards Magna Ease Tissue Valve. (N/A) CORONARY ARTERY BYPASS GRAFTING (CABG) x1:  LIMA to LAD. (N/A) TRANSESOPHAGEAL ECHOCARDIOGRAM (TEE) (N/A) Subjective: Feels good, remains on O2, BP control is fair, some short runs of Vtach/SVT  Objective: Vital signs in last 24 hours: Temp:  [98.3 F (36.8 C)-98.6 F (37 C)] 98.6 F (37 C) (07/16 0525) Pulse Rate:  [80-89] 89 (07/15 2148) Cardiac Rhythm: Supraventricular tachycardia (07/15 2254) Resp:  [16-23] 20 (07/16 0525) BP: (131-165)/(80-83) 165/82 (07/16 0525) SpO2:  [86 %-98 %] 97 % (07/15 2148) Weight:  [77.7 kg (171 lb 4.8 oz)] 77.7 kg (171 lb 4.8 oz) (07/16 0525)  Hemodynamic parameters for last 24 hours:    Intake/Output from previous day: 07/15 0701 - 07/16 0700 In: 360 [P.O.:360] Out: 850 [Urine:850] Intake/Output this shift: No intake/output data recorded.  General appearance: alert, cooperative and no distress Heart: irregularly irregular rhythm Lungs: mildly dim in lower fields Abdomen: benign Extremities: no edema Wound: incis healing well  Lab Results: Recent Labs    03/08/18 0538  WBC 11.8*  HGB 11.0*  HCT 36.4*  PLT 361   BMET:  Recent Labs    03/08/18 0538  NA 145  K 4.2  CL 107  CO2 30  GLUCOSE 64*  BUN 13  CREATININE 0.77  CALCIUM 8.9    PT/INR: No results for input(s): LABPROT, INR in the last 72 hours. ABG    Component Value Date/Time   PHART 7.315 (L) 03/04/2018 2123   HCO3 23.9 03/04/2018 2123   TCO2 26 03/05/2018 1721   ACIDBASEDEF 2.0 03/04/2018 2123   O2SAT 92.0 03/04/2018 2123   CBG (last 3)  Recent Labs    03/09/18 1707 03/09/18 2151 03/10/18 0653  GLUCAP 119* 121* 106*    Meds Scheduled Meds: . aspirin EC  325 mg Oral Daily   Or  . aspirin  324 mg Per Tube Daily  . bisacodyl  10 mg Oral Daily   Or  . bisacodyl  10 mg Rectal Daily  . canagliflozin  100 mg Oral QAC breakfast  . docusate sodium  200 mg Oral Daily  . enoxaparin (LOVENOX) injection  40 mg Subcutaneous QHS  . insulin aspart  0-15 Units Subcutaneous TID WC  . insulin detemir  8 Units Subcutaneous BID  . lisinopril  10 mg Oral Daily  . metFORMIN  1,000 mg Oral BID WC  . metoprolol tartrate  50 mg Oral BID  . moving right along book   Does not apply Once  . pantoprazole  40 mg Oral Daily  . rosuvastatin  20 mg Oral Daily  . sodium chloride flush  3 mL Intravenous Q12H   Continuous Infusions: . sodium chloride     PRN Meds:.sodium chloride, alum & mag hydroxide-simeth, magnesium hydroxide, metoprolol tartrate, ondansetron (ZOFRAN) IV, oxyCODONE, sodium chloride flush, traMADol  Xrays No results found.  Assessment/Plan: S/P Procedure(s) (LRB): AORTIC VALVE REPLACEMENT (AVR) 30mm Edwards Physicist, medical Valve. (N/A) CORONARY ARTERY BYPASS GRAFTING (CABG) x1:  LIMA to LAD. (N/A) TRANSESOPHAGEAL ECHOCARDIOGRAM (TEE) (N/A)   1 doing well  2 remains on O2, desats to 80's especially with activity- will most likely need home O2 short term. Push pulm toilet- + Smoker 3 hypertensive, will increase lisinopril-  dose was not increased yesterday, may need additional agent 4 sugars well controlled 5 some vent dysrhythmias- EF 35-45% on cath/echo- consider increase beta blocker dose, defer to cardiology 6 routine cardiac rehab 7 no new labs today  LOS: 6 days    [name] Giovanni 03/10/2018 Patient seen and examined, agree with above Will dc home today, may need short term O2  Remo Lipps C. Roxan Hockey, MD [facility] [phone]

## 2018-03-10 NOTE — Progress Notes (Signed)
Patient is ready for discharge. He is alert and oreinted. Patient understands his discharge instructions and has had all of his questions satisfied. He has been taken off of telemetry and CCMD has been notified. Chest tube sutures have been removed, IV removed, without complication and catheter intact. Patient will be discharged with O2 and [place] will call to set up appt. He is leaving with O2 tank and portable tank. He has all of his belongings and will be transported home by his wife who is here with him now. Patient will leave unit by wheelchair and meet wife at the front entrance.

## 2018-03-10 NOTE — Telephone Encounter (Signed)
Copied from [place] [id number]. Topic: Inquiry >> Mar 10, 2018  5:37 PM Oliver Pila B wrote: Reason for CRM: pt has been discharged from the hospital; pt has an appt on this Thursday but if the pt is needing a separate appt for a hos f/u please advise pt on getting in for it

## 2018-03-10 NOTE — Progress Notes (Signed)
CARDIAC REHAB PHASE I   PRE:  Rate/Rhythm: 36 SR with PVCs  BP:  Sitting: 100/71      SaO2: 98 2L  MODE:  Ambulation: 470 ft   POST:  Rate/Rhythm: 93 SR with PVCs  BP:  Sitting: 128/90    SaO2: 92 2L   Pt ambulated 488ft in hallway standby assist with front wheel rolling walker. Sats maintained on 2L [place]. Pt states he "feels good" today. Pt with steady gait, increasing speed, and further distance then previous walks. Pt in recliner, bedside table and call bell within reach. Will continue to follow.  [phone] Rufina Falco, RN BSN 03/10/2018 11:23 AM

## 2018-03-10 NOTE — Progress Notes (Signed)
Progress Note  Patient Name: [name] Date of Encounter: 03/10/2018  Primary Cardiologist: Agustin Cree   Subjective   No angina, palpitations mild dyspnea   Inpatient Medications    Scheduled Meds: . aspirin EC  325 mg Oral Daily   Or  . aspirin  324 mg Per Tube Daily  . bisacodyl  10 mg Oral Daily   Or  . bisacodyl  10 mg Rectal Daily  . canagliflozin  100 mg Oral QAC breakfast  . docusate sodium  200 mg Oral Daily  . enoxaparin (LOVENOX) injection  40 mg Subcutaneous QHS  . insulin aspart  0-15 Units Subcutaneous TID WC  . insulin detemir  8 Units Subcutaneous BID  . lisinopril  20 mg Oral Daily  . metFORMIN  1,000 mg Oral BID WC  . metoprolol tartrate  50 mg Oral BID  . moving right along book   Does not apply Once  . pantoprazole  40 mg Oral Daily  . rosuvastatin  20 mg Oral Daily  . sodium chloride flush  3 mL Intravenous Q12H   Continuous Infusions: . sodium chloride     PRN Meds: sodium chloride, alum & mag hydroxide-simeth, magnesium hydroxide, metoprolol tartrate, ondansetron (ZOFRAN) IV, oxyCODONE, sodium chloride flush, traMADol   Vital Signs    Vitals:   03/09/18 2125 03/09/18 2144 03/09/18 2148 03/10/18 0525  BP:   (!) 145/82 (!) 165/82  Pulse:   89   Resp:   16 20  Temp:   98.6 F (37 C) 98.6 F (37 C)  TempSrc:   Oral Oral  SpO2: (!) 86% 96% 97%   Weight:    171 lb 4.8 oz (77.7 kg)  Height:        Intake/Output Summary (Last 24 hours) at 03/10/2018 0806 Last data filed at 03/10/2018 0600 Gross per 24 hour  Intake 360 ml  Output 850 ml  Net -490 ml   Filed Weights   03/08/18 0430 03/09/18 0418 03/10/18 0525  Weight: 177 lb 7.5 oz (80.5 kg) 174 lb 3.2 oz (79 kg) 171 lb 4.8 oz (77.7 kg)    Telemetry    NSR morphology LVH rates 80;'s  NSVT 3-6 beats less frequent   ECG    NSR LVH   Physical Exam   Affect appropriate Thin black male  HEENT: normal Neck supple with no adenopathy JVP normal no bruits no  thyromegaly Lungs clear with no wheezing and good diaphragmatic motion Heart:  S1/S2 SEM through AVR no AR  murmur, no rub, gallop or click PMI normal  Post sternotomy  Abdomen: benighn, BS positve, no tenderness, no AAA no bruit.  No HSM or HJR Distal pulses intact with no bruits No edema Neuro non-focal Skin warm and dry No muscular weakness   Labs    Chemistry Recent Labs  Lab 03/06/18 0327 03/07/18 0330 03/08/18 0538  NA 141 141 145  K 4.5 4.3 4.2  CL 106 104 107  CO2 27 27 30   GLUCOSE 91 82 64*  BUN 14 14 13   CREATININE 0.97 0.86 0.77  CALCIUM 8.5* 8.7* 8.9  GFRNONAA >60 >60 >60  GFRAA >60 >60 >60  ANIONGAP 8 10 8      Hematology Recent Labs  Lab 03/06/18 0327 03/07/18 0330 03/08/18 0538  WBC 14.0* 13.8* 11.8*  RBC 4.59 4.62 4.84  HGB 10.5* 10.6* 11.0*  HCT 35.3* 35.0* 36.4*  MCV 76.9* 75.8* 75.2*  MCH 22.9* 22.9* 22.7*  MCHC 29.7* 30.3 30.2  RDW  15.7* 15.2 15.1  PLT 233 287 361     Radiology    No results found.  Cardiac Studies   none  Patient Profile     67 y.o. male s/p AVR for severe AS and bicuspid AV   Assessment & Plan    1. Post op atrial fib - resolved on bid lopressor 50 mg see note from Dr Lovena Le avoid Amiodarone if possible. No anticoagulation Having some short runs 3-6 beats of NSVT Which is stable and would only continue beta blocker Rx 2. AVR:  SEM SBE prophylaxis baseline TTE in 4 weeks post op for EF and gradients 3. HLD:  Continue statin   Will arrange outpatient f/u with Agustin Cree   For questions or updates, please contact [place] Please consult [email or web address] for contact info under Cardiology/STEMI.      Signed, Jenkins Rouge, MD  03/10/2018, 8:06 AM  Patient ID: [name], male   DOB: 05-14-1951, 67 y.o.   MRN: [id number]

## 2018-03-10 NOTE — Care Management Note (Signed)
Case Management Note Marvetta Gibbons RN, BSN Unit 4E-Case Manager [phone]  Patient Details  Name: [name] MRN: [id number] Date of Birth: July 08, 1951  Subjective/Objective:   Pt admitted s/p AVR                 Action/Plan: PTA pt lived at home with spouse, per cardiac rehab will need home 02 and RW, pt will need orders prior to discharge  Expected Discharge Date:  03/10/18               Expected Discharge Plan:  Home/Self Care  In-House Referral:  NA  Discharge planning Services  CM Consult  Post Acute Care Choice:  Durable Medical Equipment Choice offered to:  Patient  DME Arranged:  Gilford Rile rolling, Oxygen DME Agency:  [place]:    [facility] Agency:     Status of Service:  Completed, signed off  If discussed at [place] of Stay Meetings, dates discussed:    Discharge Disposition: home/self care   Additional Comments:  03/10/18- 1215- Marvetta Gibbons RN, CM- pt for discharge home today with wife- orders have been placed for home 02 and RW- spoke with pt and wife at bedside- they have decided pt does not want RW for transition home- however do want home 02. Explained process, ok with using [facility] for DME needs- explained cost and process for signing waiver if pt does not qualify for insurance to cover. Pt and wife both state that they want the home 02 and are ok signing waiver if needed if insurance does not approve. Have called and notified Butch Penny with [facility] for home 02 needs and that pt/wife agreeable to waiver. Portable tank to be delivered to room prior to discharge.   Dawayne Patricia, RN 03/10/2018, 12:27 PM

## 2018-03-11 ENCOUNTER — Telehealth: Payer: Self-pay

## 2018-03-11 NOTE — Telephone Encounter (Signed)
03/11/18   Transition Care Management Follow-up Telephone Call  ADMISSION DATE: 03/04/18  DISCHARGE DATE: 03/10/18  How have you been since you were released from the hospital? Feeling good per patient.  Do you understand why you were in the hospital? Yes per patient.   Do you understand the discharge instrcutions? Yes per patient.    Items Reviewed:  Medications reviewed: Yes with wife.   Allergies reviewed: Yes   Dietary changes reviewed: Heart healthy Diabetic   Referrals reviewed:Appointment has been scheduled for follow up.   Functional Questionnaire:   Activities of Daily Living (ADLs): Patient can perform all independently.  Any patient concerns?  Wife states they will discuss during visit.   Confirmed importance and date/time of follow-up visits scheduled: Yes   Confirmed with patient if condition begins to worsen call PCP or go to the ER. Yes    Patient was given the office number and encouragred to call back with questions or concerns. Yes

## 2018-03-11 NOTE — Telephone Encounter (Signed)
Will do my best to cover both tomorrow.

## 2018-03-12 ENCOUNTER — Ambulatory Visit (INDEPENDENT_AMBULATORY_CARE_PROVIDER_SITE_OTHER): Payer: Medicare Other | Admitting: Family

## 2018-03-12 ENCOUNTER — Encounter: Payer: Self-pay | Admitting: Family

## 2018-03-12 VITALS — BP 116/75 | HR 82 | Temp 98.3°F | Resp 16 | Ht 77.0 in | Wt 173.8 lb

## 2018-03-12 DIAGNOSIS — E1165 Type 2 diabetes mellitus with hyperglycemia: Secondary | ICD-10-CM

## 2018-03-12 DIAGNOSIS — D649 Anemia, unspecified: Secondary | ICD-10-CM | POA: Diagnosis not present

## 2018-03-12 DIAGNOSIS — Z8679 Personal history of other diseases of the circulatory system: Secondary | ICD-10-CM | POA: Diagnosis not present

## 2018-03-12 DIAGNOSIS — I2510 Atherosclerotic heart disease of native coronary artery without angina pectoris: Secondary | ICD-10-CM | POA: Diagnosis not present

## 2018-03-12 DIAGNOSIS — E785 Hyperlipidemia, unspecified: Secondary | ICD-10-CM | POA: Diagnosis not present

## 2018-03-12 DIAGNOSIS — I10 Essential (primary) hypertension: Secondary | ICD-10-CM | POA: Diagnosis not present

## 2018-03-12 MED ORDER — INSULIN DETEMIR 100 UNIT/ML ~~LOC~~ SOLN: SUBCUTANEOUS | 11 refills | Status: DC

## 2018-03-12 NOTE — Patient Instructions (Addendum)
Please complete lab work prior to leaving. Continue to monitor your blood sugars.  Let us know if your blood sugars drop <80 or if the are consistently greater than 250. Follow up as scheduled with Dr. Leonarda Salon group.

## 2018-03-12 NOTE — Progress Notes (Signed)
Subjective:    Patient ID: [name], male    DOB: Jun 24, 1951, 67 y.o.   MRN: [id number]  HPI  [name] is a 67 yr old male who presents today for hospital follow up.  We initially saw him on 12/15/17 for routine follow up and a new murmur was noted.  A 2D echo was ordered that day to further evaluate.  He was asymptomatic at that time.  The patient scheduled his echo appointment for 01/12/18.  Prior to his scheduled echo appointment he presented to the  ED on 12/29/17 with a near syncopal event. He was hydrated and bp improved. He followed back up in our office the following day and was found to be stable.  He was encouraged to follow through with 2D echo appointment for his murmur.    Echo performed on 01/12/18 showed critical AS. He was admitted for cardiac cath on 01/13/18. He subsequently underwent a cardiac catheterization which revealed a 65 to 70% proximal LAD stenosis.  It was felt that his ejection fraction was approximately 35 to 40%.  He was seen in consultation by Dr. Roxan Hockey on 02/17/18.  The patient subsequently underwent recommended aortic valve replacement placement (bovine) as well as single-vessel CABG on 03/04/18.   He has been feeling well post-operatively.    HTN-he is maintained on metoprolol 50 mg twice daily, lisinopril 20 mg once daily. BP Readings from Last 3 Encounters:  03/12/18 116/75  03/10/18 (!) 165/82  03/02/18 131/80   DM2- had been on lower doses of levemir following his hospitalization but reports that he to 40 units last night.  He continues novolog 11 units AC dinner which is his largest meal. Denies hypoglycemia.  Lab Results  Component Value Date   HGBA1C 7.3 (H) 03/02/2018   HGBA1C 7.9 (H) 12/15/2017   HGBA1C 8.2 (H) 08/20/2017   Lab Results  Component Value Date   MICROALBUR 0.9 08/20/2017   LDLCALC 83 05/20/2017   CREATININE 0.77 03/08/2018   Hyperlipidemia-  the patient is maintained on Crestor 20 mg once daily. Lab Results    Component Value Date   CHOL 137 05/20/2017   HDL 42.10 05/20/2017   LDLCALC 83 05/20/2017   LDLDIRECT 177.6 10/07/2007   TRIG 60.0 05/20/2017   CHOLHDL 3 05/20/2017    Review of Systems See HPI    Past Medical History:  Diagnosis Date  . Abnormal myocardial perfusion study 2006   EF 44% ? inferoseptal ischemia. no cath done  . Breast mass, right 03/2008  . Contact dermatitis 01/31/2013  . COPD (chronic obstructive pulmonary disease) ([place])    "dx'd 12/2017"  . Coronary artery disease   . Heart murmur   . History of colonic polyps   . Hyperlipidemia   . Hypertension   . Low back pain   . Type II diabetes mellitus ([place])      Social History   Socioeconomic History  . Marital status: Married    Spouse name: Not on file  . Number of children: Not on file  . Years of education: Not on file  . Highest education level: Not on file  Occupational History  . Not on file  Social Needs  . Financial resource strain: Not on file  . Food insecurity:    Worry: Not on file    Inability: Not on file  . Transportation needs:    Medical: Not on file    Non-medical: Not on file  Tobacco Use  . Smoking status:  Former Smoker    Packs/day: 1.00    Years: 33.00    Pack years: 33.00    Types: Cigarettes  . Smokeless tobacco: Never Used  . Tobacco comment: "quit in ~ 2007"  Substance and Sexual Activity  . Alcohol use: Yes    Alcohol/week: 1.8 oz    Types: 3 Glasses of wine per week  . Drug use: Not Currently  . Sexual activity: Not Currently  Lifestyle  . Physical activity:    Days per week: Not on file    Minutes per session: Not on file  . Stress: Not on file  Relationships  . Social connections:    Talks on phone: Not on file    Gets together: Not on file    Attends religious service: Not on file    Active member of club or organization: Not on file    Attends meetings of clubs or organizations: Not on file    Relationship status: Not on file  . Intimate partner  violence:    Fear of current or ex partner: Not on file    Emotionally abused: Not on file    Physically abused: Not on file    Forced sexual activity: Not on file  Other Topics Concern  . 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 ABDOMINAL AORTOGRAM N/A 01/13/2018   Procedure: ABDOMINAL AORTOGRAM;  Surgeon: Troy Sine, MD;  Location: [facility];  Service: Cardiovascular;  Laterality: N/A;  . AORTIC VALVE REPLACEMENT N/A 03/04/2018   Procedure: AORTIC VALVE REPLACEMENT (AVR) 24mm Edwards Magna Ease Tissue Valve.;  Surgeon: Melrose Nakayama, MD;  Location: [facility];  Service: Open Heart Surgery;  Laterality: N/A;  . CARDIAC VALVE REPLACEMENT    . COLONOSCOPY W/ POLYPECTOMY    . CORONARY ARTERY BYPASS GRAFT N/A 03/04/2018   Procedure: CORONARY ARTERY BYPASS GRAFTING (CABG) x1:  LIMA to LAD.;  Surgeon: Melrose Nakayama, MD;  Location: [facility];  Service: Open Heart Surgery;  Laterality: N/A;  . LIPOMA EXCISION Left 07/2008    "back of shoulder" Dr Gershon Crane  . RIGHT/LEFT HEART CATH AND CORONARY ANGIOGRAPHY N/A 01/13/2018   Procedure: RIGHT/LEFT HEART CATH AND CORONARY ANGIOGRAPHY;  Surgeon: Troy Sine, MD;  Location: [facility];  Service: Cardiovascular;  Laterality: N/A;  . TEE WITHOUT CARDIOVERSION N/A 03/04/2018   Procedure: TRANSESOPHAGEAL ECHOCARDIOGRAM (TEE);  Surgeon: Melrose Nakayama, MD;  Location: [facility];  Service: Open Heart Surgery;  Laterality: N/A;  . TONSILLECTOMY    . WISDOM TOOTH EXTRACTION      Family History  Problem Relation Age of Onset  . Melanoma Father 32       deceased secondary to melanoma  . Alzheimer's disease Father   . Diabetes Father   . Hypertension Mother        alive -29  . Colon cancer Neg Hx     Allergies  Allergen Reactions  .  Amlodipine Besylate Other (See Comments)    REACTION: ? caused left axillary nodules/chest flutter  . Hydrochlorothiazide Hives    Current Outpatient Medications on File Prior to Visit  Medication Sig Dispense Refill  . aspirin EC 325 MG EC tablet Take  1 tablet (325 mg total) by mouth daily.  0  . Cyanocobalamin (VITAMIN B-12 PO) Take 1 tablet by mouth daily.    . dapagliflozin propanediol (FARXIGA) 5 MG TABS tablet Take 5 mg by mouth daily. 30 tablet 5  . glucose blood (PRODIGY NO CODING BLOOD GLUC) test strip Use as instructed to check blood sugar twice a day.  DX  E11.40 100 each 1  . insulin aspart (NOVOLOG FLEXPEN) 100 UNIT/ML FlexPen INJECT 11-15 SUBCUTANEOUSLY DAILY BEFORE SUPPER (Patient taking differently: Inject 11 Units into the skin daily before supper. ) 15 mL 3  . insulin detemir (LEVEMIR) 100 UNIT/ML injection Inject 0.08 mLs (8 Units total) into the skin 2 (two) times daily. 10 mL 11  . Insulin Pen Needle (B-D ULTRAFINE III SHORT PEN) 31G X 8 MM MISC USE AS DIRECTED 100 each 5  . lisinopril (PRINIVIL,ZESTRIL) 20 MG tablet Take 1 tablet (20 mg total) by mouth daily. 30 tablet 1  . metFORMIN (GLUCOPHAGE-XR) 500 MG 24 hr tablet Take 2 tablets (1,000 mg total) by mouth 2 (two) times daily. 360 tablet 1  . metoprolol tartrate (LOPRESSOR) 50 MG tablet Take 1 tablet (50 mg total) by mouth 2 (two) times daily. 60 tablet 1  . oxyCODONE (OXY IR/ROXICODONE) 5 MG immediate release tablet Take 1-2 tablets (5-10 mg total) by mouth every 6 (six) hours as needed for up to 5 days for severe pain. 30 tablet 0  . rosuvastatin (CRESTOR) 20 MG tablet TAKE 1 TABLET BY MOUTH EVERY DAY 90 tablet 1   No current facility-administered medications on file prior to visit.     BP 116/75 (BP Location: Right Arm, Cuff Size: Normal)   Pulse 82   Temp 98.3 F (36.8 C) (Oral)   Resp 16   Ht 6\' 5"  (1.956 m)   Wt 173 lb 12.8 oz (78.8 kg)   SpO2 95%   BMI 20.61 kg/m    Objective:   Physical Exam    Constitutional: He is oriented to person, place, and time. He appears well-developed and well-nourished. No distress.  HENT:  Head: Normocephalic and atraumatic.  Cardiovascular: Normal rate and regular rhythm.  No murmur heard. Pulmonary/Chest: Effort normal and breath sounds normal. No respiratory distress. He has no wheezes. He has no rales.  Musculoskeletal: He exhibits no edema.  Neurological: He is alert and oriented to person, place, and time.  Skin: Skin is warm and dry.  Sternotomy scar is well approximated  And appears to be heeling well.  Smaller abdominal incision are also healing well  Psychiatric: He has a normal mood and affect. His behavior is normal. Thought content normal.          Assessment & Plan:  Aortic stenosis- resolved following AVR. Clinically stable. He is advised to follow up with Dr. Leonarda Salon group as scheduled. He will also be starting cardiac rehab in a few weeks.  Of note, he did have some mild hypoxia pre-op on home sleep study.  Will discuss with his pulmonologist Dr. Lake Bells re: need for further work up and/or ongoing HS oxygen.  HTN- bp stable, continue current meds.  CAD- s/p single vessel cabg.  Clinically stable.  Pt has upcoming follow up scheduled with cardiology on 7/26.    DM2- advised pt to continue current insulin dosing. Pt advised as follows:  Continue to monitor your blood sugars.  Let us know if your blood sugars drop <80 or if the are consistently greater than 250.  Hyperlipidemia- tolerating crestor,  obtain follow up lipid panel.

## 2018-03-17 ENCOUNTER — Telehealth (HOSPITAL_COMMUNITY): Payer: Self-pay

## 2018-03-17 NOTE — Telephone Encounter (Signed)
Pt insurance is active and benefits verified through [employer]. Co-pay $0.00, DED $185.00/$185.00 met, out of pocket $0.00/$0.00 met, co-insurance 20%. No pre-authorization required. Passport, 03/17/2018 @ 3:38pm, EAV#[id number]  Secondary is active and benefits verified through [facility]. Co-pay $0.00, DED $1,080.00/$1,080.00 met, out of pocket $4,388.00.00/$76.79 met, co-insurance 0%. No pre-authorization required. Passport, 03/17/2018 @ 3:41pm, ZHY#[id number]  Will contact patient to see if he is interested in the Cardiac Rehab Program. If interested, patient will need to complete follow up appt. Once completed, patient will be contacted for scheduling upon review by the RN Navigator.

## 2018-03-18 ENCOUNTER — Telehealth: Payer: Self-pay | Admitting: Family

## 2018-03-18 NOTE — Telephone Encounter (Signed)
-----   Message from Len Blalock, [place] sent at 03/17/2018 10:35 AM EDT ----- Called pt to discuss results, pt states that he has 2lpm O2 qhs at home already but does not use this.  I instructed him to wear this every night, and pt expressed understanding. Since he already has this at home, is anything further needed? Thanks!  ----- Message ----- From: Juanito Doom, MD Sent: 03/16/2018   3:57 PM To: Debbrah Alar, NP, #  Caryl Pina, Please arrange 2L O2 at night for nocturnal hypoxemia. When we see him back we can talk about CPAP vs oral appliance for OSA> Thanks, B ----- Message ----- From: Debbrah Alar, NP Sent: 03/13/2018  10:01 AM To: Juanito Doom, MD  Hello,  Thanks for your help with this nice patient.  Would you mind glancing at his home sleep study report please and letting me know if you have any additional recommendations?   Thanks,  [employer]

## 2018-03-20 ENCOUNTER — Encounter: Payer: Self-pay | Admitting: Cardiology

## 2018-03-20 ENCOUNTER — Ambulatory Visit (INDEPENDENT_AMBULATORY_CARE_PROVIDER_SITE_OTHER): Payer: Medicare Other | Admitting: Cardiology

## 2018-03-20 VITALS — BP 124/64 | HR 90 | Ht 76.0 in | Wt 172.1 lb

## 2018-03-20 DIAGNOSIS — Z952 Presence of prosthetic heart valve: Secondary | ICD-10-CM | POA: Diagnosis not present

## 2018-03-20 DIAGNOSIS — E782 Mixed hyperlipidemia: Secondary | ICD-10-CM

## 2018-03-20 DIAGNOSIS — Z951 Presence of aortocoronary bypass graft: Secondary | ICD-10-CM

## 2018-03-20 DIAGNOSIS — I10 Essential (primary) hypertension: Secondary | ICD-10-CM

## 2018-03-20 HISTORY — DX: Presence of aortocoronary bypass graft: Z95.1

## 2018-03-20 NOTE — Addendum Note (Signed)
Addended by: Austin Miles on: 03/20/2018 09:39 AM   Modules accepted: Orders

## 2018-03-20 NOTE — Patient Instructions (Signed)
Medication Instructions:  Your physician recommends that you continue on your current medications as directed. Please refer to the Current Medication list given to you today.   Labwork: None  Testing/Procedures: You had an EKG today.   Follow-Up: Your physician recommends that you schedule a follow-up appointment in: 1 month.   If you need a refill on your cardiac medications before your next appointment, please call your pharmacy.   Thank you for choosing [facility]! Robyne Peers, RN [phone]

## 2018-03-20 NOTE — Progress Notes (Signed)
Cardiology Office Note:    Date:  03/20/2018   ID:  [name], DOB May 09, 1951, MRN [id number]  PCP:  Debbrah Alar, NP  Cardiologist:  Jenne Campus, MD    Referring MD: Debbrah Alar, NP   Chief Complaint  Patient presents with  . Follow up on CABG  Doing well  History of Present Illness:    [name] is a 67 y.o. male with recent open heart surgery.  He did have aortic valve replacement with biological valve 23 mm Edwards magna valve bioprosthetic at the same session he had a LIMA to LAD bypass graft this is 2-1/2 weeks after surgery he is doing very well the biggest complaint he have his problem with his back he does have chronic back problem which got worse after surgery.  Does not complain much of having pain in the chest he still try to walk around and doing well of course he does have some limitation in terms of amount of weight he can lift.  Denies having any palpitations except very few some extra beats but no sustained arrhythmias.  Overall he looks very good after recent open heart surgery.  Past Medical History:  Diagnosis Date  . Abnormal myocardial perfusion study 2006   EF 44% ? inferoseptal ischemia. no cath done  . Breast mass, right 03/2008  . Contact dermatitis 01/31/2013  . COPD (chronic obstructive pulmonary disease) ([place])    "dx'd 12/2017"  . Coronary artery disease   . Heart murmur   . History of colonic polyps   . Hyperlipidemia   . Hypertension   . Low back pain   . Type II diabetes mellitus ([place])     Past Surgical History:  Procedure Laterality Date  . ABDOMINAL AORTOGRAM N/A 01/13/2018   Procedure: ABDOMINAL AORTOGRAM;  Surgeon: Troy Sine, MD;  Location: [facility];  Service: Cardiovascular;  Laterality: N/A;  . AORTIC VALVE REPLACEMENT N/A 03/04/2018   Procedure: AORTIC VALVE REPLACEMENT (AVR) 58mm Edwards Magna Ease Tissue Valve.;  Surgeon: Melrose Nakayama, MD;  Location: [facility];  Service: Open Heart  Surgery;  Laterality: N/A;  . CARDIAC VALVE REPLACEMENT    . COLONOSCOPY W/ POLYPECTOMY    . CORONARY ARTERY BYPASS GRAFT N/A 03/04/2018   Procedure: CORONARY ARTERY BYPASS GRAFTING (CABG) x1:  LIMA to LAD.;  Surgeon: Melrose Nakayama, MD;  Location: [place];  Service: Open Heart Surgery;  Laterality: N/A;  . LIPOMA EXCISION Left 07/2008    "back of shoulder" Dr Gershon Crane  . RIGHT/LEFT HEART CATH AND CORONARY ANGIOGRAPHY N/A 01/13/2018   Procedure: RIGHT/LEFT HEART CATH AND CORONARY ANGIOGRAPHY;  Surgeon: Troy Sine, MD;  Location: [facility];  Service: Cardiovascular;  Laterality: N/A;  . TEE WITHOUT CARDIOVERSION N/A 03/04/2018   Procedure: TRANSESOPHAGEAL ECHOCARDIOGRAM (TEE);  Surgeon: Melrose Nakayama, MD;  Location: [place];  Service: Open Heart Surgery;  Laterality: N/A;  . TONSILLECTOMY    . WISDOM TOOTH EXTRACTION      Current Medications: Current Meds  Medication Sig  . aspirin EC 325 MG EC tablet Take 1 tablet (325 mg total) by mouth daily.  . Cyanocobalamin (VITAMIN B-12 PO) Take 1 tablet by mouth daily.  . dapagliflozin propanediol (FARXIGA) 5 MG TABS tablet Take 5 mg by mouth daily.  Marland Kitchen glucose blood (PRODIGY NO CODING BLOOD GLUC) test strip Use as instructed to check blood sugar twice a day.  DX  E11.40  . insulin aspart (NOVOLOG FLEXPEN) 100 UNIT/ML  FlexPen INJECT 11-15 SUBCUTANEOUSLY DAILY BEFORE SUPPER (Patient taking differently: Inject 11 Units into the skin daily before supper. )  . insulin detemir (LEVEMIR) 100 UNIT/ML injection 40 units once daily  . Insulin Pen Needle (B-D ULTRAFINE III SHORT PEN) 31G X 8 MM MISC USE AS DIRECTED  . lisinopril (PRINIVIL,ZESTRIL) 20 MG tablet Take 1 tablet (20 mg total) by mouth daily.  . metFORMIN (GLUCOPHAGE-XR) 500 MG 24 hr tablet Take 2 tablets (1,000 mg total) by mouth 2 (two) times daily.  . metoprolol tartrate (LOPRESSOR) 50 MG tablet Take 1 tablet (50 mg total) by mouth 2 (two) times daily.  . rosuvastatin  (CRESTOR) 20 MG tablet TAKE 1 TABLET BY MOUTH EVERY DAY     Allergies:   Amlodipine besylate and Hydrochlorothiazide   Social History   Socioeconomic History  . Marital status: Married    Spouse name: Not on file  . Number of children: Not on file  . Years of education: Not on file  . Highest education level: Not on file  Occupational History  . Not on file  Social Needs  . Financial resource strain: Not on file  . Food insecurity:    Worry: Not on file    Inability: Not on file  . Transportation needs:    Medical: Not on file    Non-medical: Not on file  Tobacco Use  . Smoking status: Former Smoker    Packs/day: 1.00    Years: 33.00    Pack years: 33.00    Types: Cigarettes  . Smokeless tobacco: Never Used  . Tobacco comment: "quit in ~ 2007"  Substance and Sexual Activity  . Alcohol use: Yes    Alcohol/week: 1.8 oz    Types: 3 Glasses of wine per week  . Drug use: Not Currently  . Sexual activity: Not Currently  Lifestyle  . Physical activity:    Days per week: Not on file    Minutes per session: Not on file  . Stress: Not on file  Relationships  . Social connections:    Talks on phone: Not on file    Gets together: Not on file    Attends religious service: Not on file    Active member of club or organization: Not on file    Attends meetings of clubs or organizations: Not on file    Relationship status: Not on file  Other Topics Concern  . 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 There is no history of Colon cancer. ROS:   Please see the history of present illness.    All 14 point review of systems negative except as described per history  of present illness  EKGs/Labs/Other Studies Reviewed:      Recent Labs: 03/02/2018: ALT 10 03/08/2018: BUN 13; Creatinine, Ser 0.77; Hemoglobin 11.0; Magnesium 2.0; Platelets 361; Potassium 4.2; Sodium 145  Recent Lipid Panel    Component Value Date/Time   CHOL 137 05/20/2017 1129   TRIG 60.0 05/20/2017 1129   HDL 42.10 05/20/2017 1129   CHOLHDL 3 05/20/2017 1129   VLDL 12.0 05/20/2017 1129  LDLCALC 83 05/20/2017 1129   LDLDIRECT 177.6 10/07/2007 1001    Physical Exam:    VS:  BP 124/64   Pulse 90   Ht 6\' 4"  (1.93 m)   Wt 172 lb 1.9 oz (78.1 kg)   SpO2 91%   BMI 20.95 kg/m     Wt Readings from Last 3 Encounters:  03/20/18 172 lb 1.9 oz (78.1 kg)  03/12/18 173 lb 12.8 oz (78.8 kg)  03/10/18 171 lb 4.8 oz (77.7 kg)     GEN:  Well nourished, well developed in no acute distress HEENT: Normal NECK: No JVD; No carotid bruits LYMPHATICS: No lymphadenopathy CARDIAC: RRR, no murmurs, no rubs, no gallops RESPIRATORY:  Clear to auscultation without rales, wheezing or rhonchi, few crakels lt base ABDOMEN: Soft, non-tender, non-distended MUSCULOSKELETAL:  No edema; No deformity  SKIN: Warm and dry LOWER EXTREMITIES: no swelling NEUROLOGIC:  Alert and oriented x 3 PSYCHIATRIC:  Normal affect   ASSESSMENT:    1. S/P AVR   2. Status post coronary artery bypass graft   3. Essential hypertension   4. Mixed hyperlipidemia    PLAN:    In order of problems listed above:  1. Status post aortic valve replacement wound healed completely doing well from that point review recovering.  Surgeon making arrangements for referral to cardiac rehab. 2. Status post coronary artery bypass graft LIMA to LAD was placed during surgery for artery valve replacement doing well from that point of view. 3. Essential hypertension his blood pressure well controlled continue present management. 4. Mixed dyslipidemia he is on Crestor 20 which I will continue.  In about 2 months we will repeat his  fasting lipid profile and we may be forced to augment his antilipid therapy.  Overall his symptoms looks like he is recovering very nicely after recent open heart surgery for aortic valve replacement and grafting of his LIMA to LAD.   Medication Adjustments/Labs and Tests Ordered: Current medicines are reviewed at length with the patient today.  Concerns regarding medicines are outlined above.  No orders of the defined types were placed in this encounter.  Medication changes: No orders of the defined types were placed in this encounter.   Signed, Park Liter, MD, [facility] 03/20/2018 9:30 AM    [place]

## 2018-03-23 ENCOUNTER — Telehealth (HOSPITAL_COMMUNITY): Payer: Self-pay

## 2018-03-23 NOTE — Telephone Encounter (Signed)
Called patient in regards to Cardiac Rehab - patient is interested in the program. Scheduled orientation on 04/30/18 at 8:00am. Patient will attend the 9:45am exc class. Went over insurance with patient and he verbalized understanding. Mailed packet.

## 2018-03-30 ENCOUNTER — Telehealth: Payer: Self-pay | Admitting: Family

## 2018-03-30 ENCOUNTER — Other Ambulatory Visit: Payer: Self-pay | Admitting: Thoracic Surgery (Cardiothoracic Vascular Surgery)

## 2018-03-30 DIAGNOSIS — Z952 Presence of prosthetic heart valve: Secondary | ICD-10-CM

## 2018-03-30 MED ORDER — METFORMIN HCL ER 500 MG PO TB24: 1000.0000 mg | ORAL_TABLET | Freq: Two times a day (BID) | ORAL | 1 refills | Status: DC

## 2018-03-30 NOTE — Addendum Note (Signed)
Addended by: Kelle Darting A on: 03/30/2018 02:13 PM   Modules accepted: Orders

## 2018-03-30 NOTE — Telephone Encounter (Signed)
Refills sent

## 2018-03-30 NOTE — Telephone Encounter (Signed)
Copied from [place] [id number]. Topic: Quick Communication - Rx Refill/Question >> Mar 30, 2018 10:26 AM Judyann Munson wrote: Medication: metFORMIN (GLUCOPHAGE-XR) 500 MG 24 hr tablet  Has the patient contacted their pharmacy?no  Preferred Pharmacy (with phone number or street name): [id number] [place], [place] [phone] (Phone) [phone] (Fax)      Agent: Please be advised that RX refills may take up to 3 business days. We ask that you follow-up with your pharmacy.

## 2018-03-31 ENCOUNTER — Other Ambulatory Visit: Payer: Self-pay

## 2018-03-31 ENCOUNTER — Ambulatory Visit (INDEPENDENT_AMBULATORY_CARE_PROVIDER_SITE_OTHER): Payer: Self-pay | Admitting: Physician Assistant

## 2018-03-31 ENCOUNTER — Ambulatory Visit
Admission: RE | Admit: 2018-03-31 | Discharge: 2018-03-31 | Disposition: A | Discharge status: Home / Self Care | Payer: Medicare Other | Source: Ambulatory Visit | Attending: Thoracic Surgery (Cardiothoracic Vascular Surgery) | Admitting: Thoracic Surgery (Cardiothoracic Vascular Surgery)

## 2018-03-31 VITALS — BP 106/77 | HR 83 | Resp 16 | Ht 76.0 in | Wt 167.2 lb

## 2018-03-31 DIAGNOSIS — I2510 Atherosclerotic heart disease of native coronary artery without angina pectoris: Secondary | ICD-10-CM

## 2018-03-31 DIAGNOSIS — Z952 Presence of prosthetic heart valve: Secondary | ICD-10-CM

## 2018-03-31 DIAGNOSIS — Z951 Presence of aortocoronary bypass graft: Secondary | ICD-10-CM

## 2018-03-31 DIAGNOSIS — J90 Pleural effusion, not elsewhere classified: Secondary | ICD-10-CM | POA: Diagnosis not present

## 2018-03-31 DIAGNOSIS — I350 Nonrheumatic aortic (valve) stenosis: Secondary | ICD-10-CM

## 2018-03-31 NOTE — Progress Notes (Signed)
HPI:  Patient returns for routine postoperative follow-up having undergone CABG x 1 and AVR on 03/04/2018.  The patient's early postoperative recovery while in the hospital was unremarkable.  Since hospital discharge the patient reports he is doing very well.  He does have episodes of night sweats that was present prior to surgery, but have been a little worse since surgery.  He denies any unintentional significant weight loss, fevers, or hemoptysis.  He is ambulating without issue.  He has already resumed driving.  He is due to start cardiac rehab in September.  Current Outpatient Medications  Medication Sig Dispense Refill  . aspirin EC 325 MG EC tablet Take 1 tablet (325 mg total) by mouth daily.  0  . Cyanocobalamin (VITAMIN B-12 PO) Take 1 tablet by mouth daily.    . dapagliflozin propanediol (FARXIGA) 5 MG TABS tablet Take 5 mg by mouth daily. 30 tablet 5  . glucose blood (PRODIGY NO CODING BLOOD GLUC) test strip Use as instructed to check blood sugar twice a day.  DX  E11.40 100 each 1  . insulin aspart (NOVOLOG FLEXPEN) 100 UNIT/ML FlexPen INJECT 11-15 SUBCUTANEOUSLY DAILY BEFORE SUPPER (Patient taking differently: Inject 11 Units into the skin daily before supper. ) 15 mL 3  . insulin detemir (LEVEMIR) 100 UNIT/ML injection 40 units once daily 10 mL 11  . Insulin Pen Needle (B-D ULTRAFINE III SHORT PEN) 31G X 8 MM MISC USE AS DIRECTED 100 each 5  . lisinopril (PRINIVIL,ZESTRIL) 20 MG tablet Take 1 tablet (20 mg total) by mouth daily. 30 tablet 1  . metFORMIN (GLUCOPHAGE-XR) 500 MG 24 hr tablet Take 2 tablets (1,000 mg total) by mouth 2 (two) times daily. 360 tablet 1  . metoprolol tartrate (LOPRESSOR) 50 MG tablet Take 1 tablet (50 mg total) by mouth 2 (two) times daily. 60 tablet 1  . rosuvastatin (CRESTOR) 20 MG tablet TAKE 1 TABLET BY MOUTH EVERY DAY 90 tablet 1   No current facility-administered medications for this visit.    Physical Exam:  BP 106/77 (BP Location: Left Arm, Patient  Position: Sitting, Cuff Size: Large)   Pulse 83   Resp 16   Ht 6\' 4"  (1.93 m)   Wt 167 lb 3.2 oz (75.8 kg)   SpO2 97% Comment: RA  BMI 20.35 kg/m   Gen: no apparent distress Heart: RRR Lungs: CTA bilaterally Abd: soft non-tender, non-distended Ext: no edema Incisions: well healed  Diagnostic Tests:  CXR: no pleural effusion, no pneumothorax, wires intact AVR prosthesis present  A/P:  1. S/P AVR, CABG x 1- doing very well, hemodynamically stable 2. Sweats- can be related to narcotic use, but has hasn't taken these in a few weeks.  They may be related to being on pump.  I encouraged patient that if this does not improve he should be evaluated by his PCP. 3. Activity- increase as tolerated, okay to continue to drive, start cardiac rehab in September, instructions about Endocarditis provided 4. RTC in 3-6 months with [place], PA-C [facility] [phone]

## 2018-03-31 NOTE — Patient Instructions (Signed)
You may return to driving an automobile as long as you are no longer requiring oral narcotic pain relievers during the daytime.  It would be wise to start driving only short distances during the daylight and gradually increase from there as you feel comfortable.   Make every effort to maintain a "heart-healthy" lifestyle with regular physical exercise and adherence to a low-fat, low-carbohydrate diet.  Continue to seek regular follow-up appointments with your primary care physician and/or cardiologist.   Endocarditis is a potentially serious infection of heart valves or inside lining of the heart.  It occurs more commonly in patients with diseased heart valves (such as patient's with aortic or mitral valve disease) and in patients who have undergone heart valve repair or replacement.  Certain surgical and dental procedures may put you at risk, such as dental cleaning, other dental procedures, or any surgery involving the respiratory, urinary, gastrointestinal tract, gallbladder or prostate gland.   To minimize your chances for develooping endocarditis, maintain good oral health and seek prompt medical attention for any infections involving the mouth, teeth, gums, skin or urinary tract.    Always notify your doctor or dentist about your underlying heart valve condition before having any invasive procedures. You will need to take antibiotics before certain procedures, including all routine dental cleanings or other dental procedures.  Your cardiologist or dentist should prescribe these antibiotics for you to be taken ahead of time.

## 2018-04-01 ENCOUNTER — Telehealth: Payer: Self-pay | Admitting: Pulmonary Disease

## 2018-04-01 DIAGNOSIS — R0602 Shortness of breath: Secondary | ICD-10-CM

## 2018-04-01 NOTE — Telephone Encounter (Signed)
Do we have an ONO on RA? If not please order

## 2018-04-01 NOTE — Telephone Encounter (Signed)
Called and spoke with Patient.  Patient stated that he had been checking his O2 sats and they have been 95% on RA.  Patient stated that he had been using it at night, but did not last night.  Patient has not experienced any SHOB.  He is wanting to know if Dr Lake Bells would like him to continue O2 at night.  Dr Lake Bells please advise

## 2018-04-01 NOTE — Telephone Encounter (Signed)
Spoke with patient. He is willing to have the ONO done since he has not had this done yet. Advised him that I would place the order today, he verbalized understanding.   Nothing further needed at time of call.

## 2018-04-07 ENCOUNTER — Ambulatory Visit: Payer: Medicare Other | Admitting: Thoracic Surgery (Cardiothoracic Vascular Surgery)

## 2018-04-09 ENCOUNTER — Telehealth: Payer: Self-pay | Admitting: Emergency Medicine

## 2018-04-09 NOTE — Telephone Encounter (Signed)
Spoke with the pt  Per last phone note 04/01/18 BQ wanted ONO on RA done to ensure okay to d/c o2  He states nobody has called him to schedule this yet  I see the order was placed  Will send to [facility] to check on why it has not been done yet thanks

## 2018-04-10 NOTE — Telephone Encounter (Signed)
Called [facility] & spoke to [place].  He states they do have the order but haven't had a chance to get to it yet.  He states everyone in resp therapy has left for the day but to tell pt hopefully they will get a call next Monday or Tuesday and if pt doesn't get a call have them to call ext [phone] at [phone] [facility].  Called pt & spoke to his wife & gave her the info & the extension to call.  Nothing further needed at this time.

## 2018-04-16 DIAGNOSIS — R0902 Hypoxemia: Secondary | ICD-10-CM | POA: Diagnosis not present

## 2018-04-16 DIAGNOSIS — J449 Chronic obstructive pulmonary disease, unspecified: Secondary | ICD-10-CM | POA: Diagnosis not present

## 2018-04-23 ENCOUNTER — Other Ambulatory Visit: Payer: Self-pay | Admitting: Family

## 2018-04-23 NOTE — Telephone Encounter (Signed)
Received result from Dr. Lake Bells. Patient's ONO on room air showed that he was less than 88% for more than an hour. Patient needs to be on 2 L at HS.  Called and spoke to the patient and relayed these results and recommendations.  Called and spoke to [place] at [facility] and he stated that patient has oxygen at home and when he was discharged from the hospital he qualified to wear oxygen during the day as well. Leaving oxygen orders as is for now. Nothing further needed.

## 2018-04-24 ENCOUNTER — Telehealth (HOSPITAL_COMMUNITY): Payer: Self-pay | Admitting: Pharmacist

## 2018-04-24 ENCOUNTER — Ambulatory Visit (INDEPENDENT_AMBULATORY_CARE_PROVIDER_SITE_OTHER): Payer: Medicare Other | Admitting: Cardiology

## 2018-04-24 ENCOUNTER — Encounter: Payer: Self-pay | Admitting: Cardiology

## 2018-04-24 VITALS — BP 154/90 | HR 65 | Ht 76.0 in | Wt 177.8 lb

## 2018-04-24 DIAGNOSIS — I2510 Atherosclerotic heart disease of native coronary artery without angina pectoris: Secondary | ICD-10-CM

## 2018-04-24 DIAGNOSIS — Z951 Presence of aortocoronary bypass graft: Secondary | ICD-10-CM

## 2018-04-24 DIAGNOSIS — Z952 Presence of prosthetic heart valve: Secondary | ICD-10-CM

## 2018-04-24 DIAGNOSIS — R0609 Other forms of dyspnea: Secondary | ICD-10-CM | POA: Diagnosis not present

## 2018-04-24 DIAGNOSIS — I10 Essential (primary) hypertension: Secondary | ICD-10-CM | POA: Diagnosis not present

## 2018-04-24 MED ORDER — INSULIN DETEMIR 100 UNIT/ML ~~LOC~~ SOLN: SUBCUTANEOUS | 5 refills | Status: DC

## 2018-04-24 NOTE — Progress Notes (Signed)
Cardiology Office Note:    Date:  04/24/2018   ID:  [name], DOB [birth year], MRN [id number]  PCP:  Debbrah Alar, NP  Cardiologist:  Jenne Campus, MD    Referring MD: Debbrah Alar, NP   Chief Complaint  Patient presents with  . Follow-up    1 month follow up  Doing well but does have a few concerns  History of Present Illness:    [name] is a 67 y.o. male with aortic valve replacement done about 2 months ago, single vessel bypass graft.  Recovered very nicely doing very well 2 concerns #1 he still has some sweating at night but is been going on a ready for months since the time of surgery however overall it looks like it is getting better he also described to have some pain in his back when he moves certain way another concern that he have to is the fact that he required oxygen at night and basically asked me how long he will required. Denies have any chest pain tightness squeezing pressure burning chest no shortness of breath.  Past Medical History:  Diagnosis Date  . Abnormal myocardial perfusion study 2006   EF 44% ? inferoseptal ischemia. no cath done  . Breast mass, right 03/2008  . Contact dermatitis 01/31/2013  . COPD (chronic obstructive pulmonary disease) ([place])    "dx'd 12/2017"  . Coronary artery disease   . Heart murmur   . History of colonic polyps   . Hyperlipidemia   . Hypertension   . Low back pain   . Type II diabetes mellitus ([place])     Past Surgical History:  Procedure Laterality Date  . ABDOMINAL AORTOGRAM N/A 01/13/2018   Procedure: ABDOMINAL AORTOGRAM;  Surgeon: Troy Sine, MD;  Location: [place] [facility];  Service: Cardiovascular;  Laterality: N/A;  . AORTIC VALVE REPLACEMENT N/A 03/04/2018   Procedure: AORTIC VALVE REPLACEMENT (AVR) 55mm Edwards Magna Ease Tissue Valve.;  Surgeon: Melrose Nakayama, MD;  Location: [place];  Service: Open Heart Surgery;  Laterality: N/A;  . CARDIAC VALVE REPLACEMENT    .  COLONOSCOPY W/ POLYPECTOMY    . CORONARY ARTERY BYPASS GRAFT N/A 03/04/2018   Procedure: CORONARY ARTERY BYPASS GRAFTING (CABG) x1:  LIMA to LAD.;  Surgeon: Melrose Nakayama, MD;  Location: [place];  Service: Open Heart Surgery;  Laterality: N/A;  . LIPOMA EXCISION Left 07/2008    "back of shoulder" Dr Gershon Crane  . RIGHT/LEFT HEART CATH AND CORONARY ANGIOGRAPHY N/A 01/13/2018   Procedure: RIGHT/LEFT HEART CATH AND CORONARY ANGIOGRAPHY;  Surgeon: Troy Sine, MD;  Location: [place] [facility];  Service: Cardiovascular;  Laterality: N/A;  . TEE WITHOUT CARDIOVERSION N/A 03/04/2018   Procedure: TRANSESOPHAGEAL ECHOCARDIOGRAM (TEE);  Surgeon: Melrose Nakayama, MD;  Location: [place];  Service: Open Heart Surgery;  Laterality: N/A;  . TONSILLECTOMY    . WISDOM TOOTH EXTRACTION      Current Medications: Current Meds  Medication Sig  . aspirin EC 325 MG EC tablet Take 1 tablet (325 mg total) by mouth daily.  . Cyanocobalamin (VITAMIN B-12 PO) Take 1 tablet by mouth daily.  . dapagliflozin propanediol (FARXIGA) 5 MG TABS tablet Take 5 mg by mouth daily.  Marland Kitchen glucose blood (PRODIGY NO CODING BLOOD GLUC) test strip Use as instructed to check blood sugar twice a day.  DX  E11.40  . insulin aspart (NOVOLOG FLEXPEN) 100 UNIT/ML FlexPen INJECT 11-15 SUBCUTANEOUSLY DAILY BEFORE SUPPER (Patient taking differently: Inject  11 Units into the skin daily before supper. )  . insulin detemir (LEVEMIR) 100 UNIT/ML injection 40 units once daily  . Insulin Pen Needle (B-D ULTRAFINE III SHORT PEN) 31G X 8 MM MISC USE AS DIRECTED  . lisinopril (PRINIVIL,ZESTRIL) 20 MG tablet Take 1 tablet (20 mg total) by mouth daily.  . metFORMIN (GLUCOPHAGE-XR) 500 MG 24 hr tablet Take 2 tablets (1,000 mg total) by mouth 2 (two) times daily.  . metoprolol tartrate (LOPRESSOR) 50 MG tablet Take 1 tablet (50 mg total) by mouth 2 (two) times daily.  . rosuvastatin (CRESTOR) 20 MG tablet TAKE 1 TABLET BY MOUTH EVERY DAY      Allergies:   Amlodipine besylate and Hydrochlorothiazide   Social History   Socioeconomic History  . Marital status: Married    Spouse name: Not on file  . Number of children: Not on file  . Years of education: Not on file  . Highest education level: Not on file  Occupational History  . Not on file  Social Needs  . Financial resource strain: Not on file  . Food insecurity:    Worry: Not on file    Inability: Not on file  . Transportation needs:    Medical: Not on file    Non-medical: Not on file  Tobacco Use  . Smoking status: Former Smoker    Packs/day: 1.00    Years: 33.00    Pack years: 33.00    Types: Cigarettes  . Smokeless tobacco: Never Used  . Tobacco comment: "quit in ~ 2007"  Substance and Sexual Activity  . Alcohol use: Yes    Alcohol/week: 3.0 standard drinks    Types: 3 Glasses of wine per week  . Drug use: Not Currently  . Sexual activity: Not Currently  Lifestyle  . Physical activity:    Days per week: Not on file    Minutes per session: Not on file  . Stress: Not on file  Relationships  . Social connections:    Talks on phone: Not on file    Gets together: Not on file    Attends religious service: Not on file    Active member of club or organization: Not on file    Attends meetings of clubs or organizations: Not on file    Relationship status: Not on file  Other Topics Concern  . 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 There is no history of Colon cancer. ROS:   Please see the history of present illness.    All 14 point review of systems negative except as described per history of present illness  EKGs/Labs/Other Studies  Reviewed:      Recent Labs: 03/02/2018: ALT 10 03/08/2018: BUN 13; Creatinine, Ser 0.77; Hemoglobin 11.0; Magnesium 2.0; Platelets 361; Potassium 4.2; Sodium 145  Recent Lipid Panel    Component Value Date/Time   CHOL 137 05/20/2017 1129   TRIG 60.0 05/20/2017 1129   HDL 42.10 05/20/2017 1129   CHOLHDL 3 05/20/2017 1129   VLDL 12.0 05/20/2017 1129   LDLCALC 83 05/20/2017 1129   LDLDIRECT 177.6  10/07/2007 1001    Physical Exam:    VS:  BP (!) 154/90 (BP Location: Right Arm, Patient Position: Sitting, Cuff Size: Normal)   Pulse 65   Ht 6\' 4"  (1.93 m)   Wt 177 lb 12.8 oz (80.6 kg)   SpO2 96%   BMI 21.64 kg/m     Wt Readings from Last 3 Encounters:  04/24/18 177 lb 12.8 oz (80.6 kg)  03/31/18 167 lb 3.2 oz (75.8 kg)  03/20/18 172 lb 1.9 oz (78.1 kg)     GEN:  Well nourished, well developed in no acute distress HEENT: Normal NECK: No JVD; No carotid bruits LYMPHATICS: No lymphadenopathy CARDIAC: RRR, no murmurs, no rubs, no gallops RESPIRATORY:  Clear to auscultation without rales, wheezing or rhonchi  ABDOMEN: Soft, non-tender, non-distended MUSCULOSKELETAL:  No edema; No deformity  SKIN: Warm and dry LOWER EXTREMITIES: no swelling NEUROLOGIC:  Alert and oriented x 3 PSYCHIATRIC:  Normal affect   ASSESSMENT:    1. S/P AVR   2. Status post coronary artery bypass graft   3. Dyspnea on exertion   4. Essential hypertension   5. Coronary artery disease involving native coronary artery of native heart without angina pectoris    PLAN:    In order of problems listed above:  1. Status post aortic valve replacement bowel sounds great I will ask him to have echocardiogram to get fingerprint of the valve. 2. Status post coronary artery bypass grafting well from that point review recover completely 3. Dyspnea on exertion denies having any 4. Essential hypertension started become a problem again I will ask you to do Chem-7 if Chem-7 is fine we will double the dose of  lisinopril and then recheck his kidney function again.  He may require additional some extra medication to get his blood pressure under control 5. Dyslipidemia he is on statin which I will continue.  He is getting ready to start his rehab which is an excellent idea.  I see him back in my office 3 months or sooner if he get a problem   Medication Adjustments/Labs and Tests Ordered: Current medicines are reviewed at length with the patient today.  Concerns regarding medicines are outlined above.  No orders of the defined types were placed in this encounter.  Medication changes: No orders of the defined types were placed in this encounter.   Signed, Park Liter, MD, [facility] 04/24/2018 10:54 AM    [place]

## 2018-04-24 NOTE — Patient Instructions (Signed)
Medication Instructions:  Your physician recommends that you continue on your current medications as directed. Please refer to the Current Medication list given to you today.   Labwork: You will have lab work today: BMP   Testing/Procedures: Your physician has requested that you have an echocardiogram. Echocardiography is a painless test that uses sound waves to create images of your heart. It provides your doctor with information about the size and shape of your heart and how well your heart's chambers and valves are working. This procedure takes approximately one hour. There are no restrictions for this procedure.    Follow-Up: Your physician wants you to follow-up in: 3 months. You will receive a reminder letter in the mail two months in advance. If you don't receive a letter, please call our office to schedule the follow-up appointment.   Any Other Special Instructions Will Be Listed Below (If Applicable).     If you need a refill on your cardiac medications before your next appointment, please call your pharmacy.

## 2018-04-25 LAB — BASIC METABOLIC PANEL
BUN / CREAT RATIO: 14 (ref 10–24)
BUN: 12 mg/dL (ref 8–27)
CHLORIDE: 101 mmol/L (ref 96–106)
CO2: 23 mmol/L (ref 20–29)
Calcium: 9.8 mg/dL (ref 8.6–10.2)
Creatinine, Ser: 0.84 mg/dL (ref 0.76–1.27)
GFR calc Af Amer: 105 mL/min/{1.73_m2} (ref >59)
GFR calc non Af Amer: 91 mL/min/{1.73_m2} (ref >59)
GLUCOSE: 86 mg/dL (ref 65–99)
Potassium: 5.5 mmol/L — ABNORMAL HIGH (ref 3.5–5.2)
SODIUM: 141 mmol/L (ref 134–144)

## 2018-04-28 ENCOUNTER — Telehealth (HOSPITAL_COMMUNITY): Payer: Self-pay | Admitting: Pharmacist

## 2018-04-28 NOTE — Telephone Encounter (Signed)
Cardiac Rehab - Pharmacy Resident Documentation   Patient unable to be reached after three call attempts. Please complete allergy verification and medication review during patient's cardiac rehab appointment.     Thank you for allowing pharmacy to be a part of this patient's care.  Tamela Gammon, PharmD 04/28/2018 10:57 AM PGY-1 Pharmacy Resident Direct Phone: [phone] Please check [email or web address] for unit-specific pharmacist phone numbers

## 2018-04-29 ENCOUNTER — Telehealth: Payer: Self-pay | Admitting: Emergency Medicine

## 2018-04-29 DIAGNOSIS — I10 Essential (primary) hypertension: Secondary | ICD-10-CM

## 2018-04-29 MED ORDER — FUROSEMIDE 20 MG PO TABS: 20.0000 mg | ORAL_TABLET | Freq: Every day | ORAL | 1 refills | Status: DC

## 2018-04-29 NOTE — Telephone Encounter (Signed)
Patient informed to start furosemide 20 mg daily per Dr. Fraser Din and have blood work drawn in 1 week. Patient verbally understands.

## 2018-04-30 ENCOUNTER — Encounter (HOSPITAL_COMMUNITY)
Admission: RE | Admit: 2018-04-30 | Discharge: 2018-04-30 | Disposition: A | Discharge status: Home / Self Care | Payer: Medicare Other | Source: Ambulatory Visit | Attending: Cardiology | Admitting: Cardiology

## 2018-04-30 ENCOUNTER — Encounter (HOSPITAL_COMMUNITY): Payer: Self-pay

## 2018-04-30 VITALS — BP 132/78 | HR 79 | Ht 74.5 in | Wt 179.2 lb

## 2018-04-30 DIAGNOSIS — E119 Type 2 diabetes mellitus without complications: Secondary | ICD-10-CM | POA: Diagnosis not present

## 2018-04-30 DIAGNOSIS — Z952 Presence of prosthetic heart valve: Secondary | ICD-10-CM | POA: Diagnosis not present

## 2018-04-30 DIAGNOSIS — Z86010 Personal history of colonic polyps: Secondary | ICD-10-CM | POA: Insufficient documentation

## 2018-04-30 DIAGNOSIS — J449 Chronic obstructive pulmonary disease, unspecified: Secondary | ICD-10-CM | POA: Diagnosis not present

## 2018-04-30 DIAGNOSIS — Z794 Long term (current) use of insulin: Secondary | ICD-10-CM | POA: Insufficient documentation

## 2018-04-30 DIAGNOSIS — Z87891 Personal history of nicotine dependence: Secondary | ICD-10-CM | POA: Insufficient documentation

## 2018-04-30 DIAGNOSIS — Z79899 Other long term (current) drug therapy: Secondary | ICD-10-CM | POA: Insufficient documentation

## 2018-04-30 DIAGNOSIS — Z7982 Long term (current) use of aspirin: Secondary | ICD-10-CM | POA: Diagnosis not present

## 2018-04-30 DIAGNOSIS — I10 Essential (primary) hypertension: Secondary | ICD-10-CM | POA: Insufficient documentation

## 2018-04-30 DIAGNOSIS — E785 Hyperlipidemia, unspecified: Secondary | ICD-10-CM | POA: Insufficient documentation

## 2018-04-30 DIAGNOSIS — I2510 Atherosclerotic heart disease of native coronary artery without angina pectoris: Secondary | ICD-10-CM | POA: Insufficient documentation

## 2018-04-30 DIAGNOSIS — Z951 Presence of aortocoronary bypass graft: Secondary | ICD-10-CM | POA: Diagnosis not present

## 2018-04-30 NOTE — Progress Notes (Signed)
Cardiac Individual Treatment Plan  Patient Details  Name: [name] MRN: [id number] Date of Birth: 02-May-1951 Referring Provider:     CARDIAC REHAB PHASE II ORIENTATION from 04/30/2018 in [facility]  Referring Provider  [name]       Initial Encounter Date:    CARDIAC REHAB PHASE II ORIENTATION from 04/30/2018 in [facility]  Date  04/30/18      Visit Diagnosis: S/P CABG (coronary artery bypass graft)  S/P AVR  Patient's Home Medications on Admission:  Current Outpatient Medications:  .  aspirin EC 325 MG EC tablet, Take 1 tablet (325 mg total) by mouth daily., Disp: , Rfl: 0 .  Cyanocobalamin (VITAMIN B-12 PO), Take 1 tablet by mouth daily., Disp: , Rfl:  .  dapagliflozin propanediol (FARXIGA) 5 MG TABS tablet, Take 5 mg by mouth daily., Disp: 30 tablet, Rfl: 5 .  furosemide (LASIX) 20 MG tablet, Take 1 tablet (20 mg total) by mouth daily., Disp: 30 tablet, Rfl: 1 .  glucose blood (PRODIGY NO CODING BLOOD GLUC) test strip, Use as instructed to check blood sugar twice a day.  DX  E11.40, Disp: 100 each, Rfl: 1 .  insulin aspart (NOVOLOG FLEXPEN) 100 UNIT/ML FlexPen, INJECT 11-15 SUBCUTANEOUSLY DAILY BEFORE SUPPER (Patient taking differently: Inject 11 Units into the skin daily before supper. ), Disp: 15 mL, Rfl: 3 .  insulin detemir (LEVEMIR) 100 UNIT/ML injection, 40 units once daily, Disp: 10 mL, Rfl: 5 .  Insulin Pen Needle (B-D ULTRAFINE III SHORT PEN) 31G X 8 MM MISC, USE AS DIRECTED, Disp: 100 each, Rfl: 5 .  lisinopril (PRINIVIL,ZESTRIL) 20 MG tablet, Take 1 tablet (20 mg total) by mouth daily., Disp: 30 tablet, Rfl: 1 .  metFORMIN (GLUCOPHAGE-XR) 500 MG 24 hr tablet, Take 2 tablets (1,000 mg total) by mouth 2 (two) times daily., Disp: 360 tablet, Rfl: 1 .  metoprolol tartrate (LOPRESSOR) 50 MG tablet, Take 1 tablet (50 mg total) by mouth 2 (two) times daily., Disp: 60 tablet, Rfl: 1 .   rosuvastatin (CRESTOR) 20 MG tablet, TAKE 1 TABLET BY MOUTH EVERY DAY, Disp: 90 tablet, Rfl: 1  Past Medical History: Past Medical History:  Diagnosis Date  . Abnormal myocardial perfusion study 2006   EF 44% ? inferoseptal ischemia. no cath done  . Breast mass, right 03/2008  . Contact dermatitis 01/31/2013  . COPD (chronic obstructive pulmonary disease) ([place])    "dx'd 12/2017"  . Coronary artery disease   . Heart murmur   . History of colonic polyps   . Hyperlipidemia   . Hypertension   . Low back pain   . Type II diabetes mellitus ([facility])     Tobacco Use: Social History   Tobacco Use  Smoking Status Former Smoker  . Packs/day: 1.00  . Years: 33.00  . Pack years: 33.00  . Types: Cigarettes  Smokeless Tobacco Never Used  Tobacco Comment   "quit in ~ 2007"    Labs: Recent Review Flowsheet Data    Labs for ITP Cardiac and Pulmonary Rehab Latest Ref Rng & Units 03/04/2018 03/04/2018 03/04/2018 03/04/2018 03/05/2018   Cholestrol 0 - 200 mg/dL - - - - -   LDLCALC 0 - 99 mg/dL - - - - -   LDLDIRECT mg/dL - - - - -   HDL >39.00 mg/dL - - - - -   Trlycerides 0.0 - 149.0 mg/dL - - - - -   Hemoglobin A1c  4.8 - 5.6 % - - - - -   PHART 7.350 - 7.450 7.315(L) - 7.317(L) 7.315(L) -   PCO2ART 32.0 - 48.0 mmHg 46.7 - 45.8 46.9 -   HCO3 20.0 - 28.0 mmol/L 23.9 - 23.4 23.9 -   TCO2 22 - 32 mmol/L 25 24 25 25 26    ACIDBASEDEF 0.0 - 2.0 mmol/L 3.0(H) - 3.0(H) 2.0 -   O2SAT % 93.0 - 95.0 92.0 -      Capillary Blood Glucose: Lab Results  Component Value Date   GLUCAP 150 (H) 03/10/2018   GLUCAP 106 (H) 03/10/2018   GLUCAP 121 (H) 03/09/2018   GLUCAP 119 (H) 03/09/2018   GLUCAP 207 (H) 03/09/2018     Exercise Target Goals: Exercise Program Goal: Individual exercise prescription set using results from initial 6 min walk test and THRR while considering  patient's activity barriers and safety.   Exercise Prescription Goal: Initial exercise prescription builds to 30-45 minutes a day of  aerobic activity, 2-3 days per week.  Home exercise guidelines will be given to patient during program as part of exercise prescription that the participant will acknowledge.  Activity Barriers & Risk Stratification: Activity Barriers & Cardiac Risk Stratification - 04/30/18 1134      Activity Barriers & Cardiac Risk Stratification   Activity Barriers  Deconditioning    Cardiac Risk Stratification  High       6 Minute Walk: 6 Minute Walk    Row Name 04/30/18 1132         6 Minute Walk   Phase  Initial     Distance  1600 feet     Walk Time  6 minutes     # of Rest Breaks  0     MPH  3.03     METS  4.29     RPE  12     Perceived Dyspnea   0     VO2 Peak  14.99     Symptoms  No     Resting HR  79 bpm     Resting BP  132/78     Resting Oxygen Saturation   96 %     Exercise Oxygen Saturation  during 6 min walk  96 %     Max Ex. HR  110 bpm     Max Ex. BP  142/82     2 Minute Post BP  142/80        Oxygen Initial Assessment:   Oxygen Re-Evaluation:   Oxygen Discharge (Final Oxygen Re-Evaluation):   Initial Exercise Prescription: Initial Exercise Prescription - 04/30/18 1100      Date of Initial Exercise RX and Referring Provider   Date  04/30/18    Referring Provider  [name]     Expected Discharge Date  07/30/18      Recumbant Bike   Level  2    Watts  50    Minutes  10    METs  3.97      NuStep   Level  2    SPM  75    Minutes  10    METs  2.7      Track   Laps  14    Minutes  10    METs  3.45      Prescription Details   Frequency (times per week)  3x    Duration  Progress to 30 minutes of continuous aerobic without signs/symptoms of physical distress  Intensity   THRR 40-80% of Max Heartrate  69-138    Ratings of Perceived Exertion  11-13    Perceived Dyspnea  0-4      Progression   Progression  Continue progressive overload as per policy without signs/symptoms or physical distress.      Resistance Training   Training  Prescription  Yes    Weight  3lbs    Reps  10-15       Perform Capillary Blood Glucose checks as needed.  Exercise Prescription Changes:   Exercise Comments:   Exercise Goals and Review: Exercise Goals    Row Name 04/30/18 1133             Exercise Goals   Increase Physical Activity  Yes       Intervention  Provide advice, education, support and counseling about physical activity/exercise needs.;Develop an individualized exercise prescription for aerobic and resistive training based on initial evaluation findings, risk stratification, comorbidities and participant's personal goals.       Expected Outcomes  Short Term: Attend rehab on a regular basis to increase amount of physical activity.;Long Term: Add in home exercise to make exercise part of routine and to increase amount of physical activity.;Long Term: Exercising regularly at least 3-5 days a week.       Increase Strength and Stamina  Yes       Intervention  Provide advice, education, support and counseling about physical activity/exercise needs.;Develop an individualized exercise prescription for aerobic and resistive training based on initial evaluation findings, risk stratification, comorbidities and participant's personal goals.       Expected Outcomes  Short Term: Increase workloads from initial exercise prescription for resistance, speed, and METs.;Short Term: Perform resistance training exercises routinely during rehab and add in resistance training at home;Long Term: Improve cardiorespiratory fitness, muscular endurance and strength as measured by increased METs and functional capacity (6MWT)       Able to understand and use rate of perceived exertion (RPE) scale  Yes       Intervention  Provide education and explanation on how to use RPE scale       Expected Outcomes  Short Term: Able to use RPE daily in rehab to express subjective intensity level;Long Term:  Able to use RPE to guide intensity level when exercising  independently       Able to understand and use Dyspnea scale  Yes       Intervention  Provide education and explanation on how to use Dyspnea scale       Expected Outcomes  Short Term: Able to use Dyspnea scale daily in rehab to express subjective sense of shortness of breath during exertion;Long Term: Able to use Dyspnea scale to guide intensity level when exercising independently       Knowledge and understanding of Target Heart Rate Range (THRR)  Yes       Intervention  Provide education and explanation of THRR including how the numbers were predicted and where they are located for reference       Expected Outcomes  Short Term: Able to state/look up THRR;Long Term: Able to use THRR to govern intensity when exercising independently;Short Term: Able to use daily as guideline for intensity in rehab       Able to check pulse independently  Yes       Intervention  Provide education and demonstration on how to check pulse in carotid and radial arteries.;Review the importance of being able to check your own pulse  for safety during independent exercise       Expected Outcomes  Short Term: Able to explain why pulse checking is important during independent exercise;Long Term: Able to check pulse independently and accurately       Understanding of Exercise Prescription  Yes       Intervention  Provide education, explanation, and written materials on patient's individual exercise prescription       Expected Outcomes  Short Term: Able to explain program exercise prescription;Long Term: Able to explain home exercise prescription to exercise independently          Exercise Goals Re-Evaluation :   Discharge Exercise Prescription (Final Exercise Prescription Changes):   Nutrition:  Target Goals: Understanding of nutrition guidelines, daily intake of sodium 1500mg , cholesterol 200mg , calories 30% from fat and 7% or less from saturated fats, daily to have 5 or more servings of fruits and  vegetables.  Biometrics: Pre Biometrics - 04/30/18 1132      Pre Biometrics   Height  6' 2.5" (1.892 m)    Weight  81.3 kg    Waist Circumference  35 inches    Hip Circumference  37 inches    Waist to Hip Ratio  0.95 %    BMI (Calculated)  22.71    Triceps Skinfold  14 mm    % Body Fat  22.5 %    Grip Strength  39 kg    Flexibility  35 in    Single Leg Stand  21.19 seconds        Nutrition Therapy Plan and Nutrition Goals: Nutrition Therapy & Goals - 04/30/18 0855      Nutrition Therapy   Diet  carb modified heart healthy      Personal Nutrition Goals   Nutrition Goal  Pt to identify and limit food sources of saturated fat, trans fat, refined carbohydrates and sodium    Personal Goal #2  Pt able to name foods that affect blood glucose.    Personal Goal #3  Improved blood glucose control as evidenced by pt's A1c trending from 7.3 toward less than 7.0.      Intervention Plan   Intervention  Prescribe, educate and counsel regarding individualized specific dietary modifications aiming towards targeted core components such as weight, hypertension, lipid management, diabetes, heart failure and other comorbidities.    Expected Outcomes  Short Term Goal: Understand basic principles of dietary content, such as calories, fat, sodium, cholesterol and nutrients.       Nutrition Assessments: Nutrition Assessments - 04/30/18 0859      MEDFICTS Scores   Pre Score  32       Nutrition Goals Re-Evaluation:   Nutrition Goals Re-Evaluation:   Nutrition Goals Discharge (Final Nutrition Goals Re-Evaluation):   Psychosocial: Target Goals: Acknowledge presence or absence of significant depression and/or stress, maximize coping skills, provide positive support system. Participant is able to verbalize types and ability to use techniques and skills needed for reducing stress and depression.  Initial Review & Psychosocial Screening: Initial Psych Review & Screening - 04/30/18 1340       Initial Review   Current issues with  None Identified      Family Dynamics   Good Support System?  Yes   [name] lists his wife as a source of support.      Barriers   Psychosocial barriers to participate in program  There are no identifiable barriers or psychosocial needs.      Screening Interventions   Interventions  Encouraged  to exercise       Quality of Life Scores: Quality of Life - 04/30/18 1344      Quality of Life   Select  Quality of Life   Did not finish QOL. Will finish on first day of exercise.      Scores of 19 and below usually indicate a poorer quality of life in these areas.  A difference of  2-3 points is a clinically meaningful difference.  A difference of 2-3 points in the total score of the Quality of Life Index has been associated with significant improvement in overall quality of life, self-image, physical symptoms, and general health in studies assessing change in quality of life.  PHQ-9: Recent Review Flowsheet Data    Depression screen [facility] 2/9 03/12/2018 02/05/2017 05/07/2013   Decreased Interest 0 1 0   Down, Depressed, Hopeless 0 0 0   PHQ - 2 Score 0 1 0   Altered sleeping 0 1 -   Tired, decreased energy 0 0 -   Change in appetite 1 0 -   Feeling bad or failure about yourself  0 0 -   Trouble concentrating 0 0 -   Moving slowly or fidgety/restless 0 0 -   Suicidal thoughts 0 0 -   PHQ-9 Score 1 2 -     Interpretation of Total Score  Total Score Depression Severity:  1-4 = Minimal depression, 5-9 = Mild depression, 10-14 = Moderate depression, 15-19 = Moderately severe depression, 20-27 = Severe depression   Psychosocial Evaluation and Intervention:   Psychosocial Re-Evaluation:   Psychosocial Discharge (Final Psychosocial Re-Evaluation):   Vocational Rehabilitation: Provide vocational rehab assistance to qualifying candidates.   Vocational Rehab Evaluation & Intervention:   Education: Education Goals: Education classes will be  provided on a weekly basis, covering required topics. Participant will state understanding/return demonstration of topics presented.  Learning Barriers/Preferences: Learning Barriers/Preferences - 04/30/18 0817      Learning Barriers/Preferences   Learning Barriers  Sight    Learning Preferences  Written Material;Skilled Demonstration;Verbal Instruction       Education Topics: Count Your Pulse:  -Group instruction provided by verbal instruction, demonstration, patient participation and written materials to support subject.  Instructors address importance of being able to find your pulse and how to count your pulse when at home without a heart monitor.  Patients get hands on experience counting their pulse with staff help and individually.   Heart Attack, Angina, and Risk Factor Modification:  -Group instruction provided by verbal instruction, video, and written materials to support subject.  Instructors address signs and symptoms of angina and heart attacks.    Also discuss risk factors for heart disease and how to make changes to improve heart health risk factors.   Functional Fitness:  -Group instruction provided by verbal instruction, demonstration, patient participation, and written materials to support subject.  Instructors address safety measures for doing things around the house.  Discuss how to get up and down off the floor, how to pick things up properly, how to safely get out of a chair without assistance, and balance training.   Meditation and Mindfulness:  -Group instruction provided by verbal instruction, patient participation, and written materials to support subject.  Instructor addresses importance of mindfulness and meditation practice to help reduce stress and improve awareness.  Instructor also leads participants through a meditation exercise.    Stretching for Flexibility and Mobility:  -Group instruction provided by verbal instruction, patient participation, and  written materials to support  subject.  Instructors lead participants through series of stretches that are designed to increase flexibility thus improving mobility.  These stretches are additional exercise for major muscle groups that are typically performed during regular warm up and cool down.   Hands Only CPR:  -Group verbal, video, and participation provides a basic overview of AHA guidelines for community CPR. Role-play of emergencies allow participants the opportunity to practice calling for help and chest compression technique with discussion of AED use.   Hypertension: -Group verbal and written instruction that provides a basic overview of hypertension including the most recent diagnostic guidelines, risk factor reduction with self-care instructions and medication management.    Nutrition I class: Heart Healthy Eating:  -Group instruction provided by PowerPoint slides, verbal discussion, and written materials to support subject matter. The instructor gives an explanation and review of the Therapeutic Lifestyle Changes diet recommendations, which includes a discussion on lipid goals, dietary fat, sodium, fiber, plant stanol/sterol esters, sugar, and the components of a well-balanced, healthy diet.   Nutrition II class: Lifestyle Skills:  -Group instruction provided by PowerPoint slides, verbal discussion, and written materials to support subject matter. The instructor gives an explanation and review of label reading, grocery shopping for heart health, heart healthy recipe modifications, and ways to make healthier choices when eating out.   Diabetes Question & Answer:  -Group instruction provided by PowerPoint slides, verbal discussion, and written materials to support subject matter. The instructor gives an explanation and review of diabetes co-morbidities, pre- and post-prandial blood glucose goals, pre-exercise blood glucose goals, signs, symptoms, and treatment of hypoglycemia and  hyperglycemia, and foot care basics.   Diabetes Blitz:  -Group instruction provided by PowerPoint slides, verbal discussion, and written materials to support subject matter. The instructor gives an explanation and review of the physiology behind type 1 and type 2 diabetes, diabetes medications and rational behind using different medications, pre- and post-prandial blood glucose recommendations and Hemoglobin A1c goals, diabetes diet, and exercise including blood glucose guidelines for exercising safely.    Portion Distortion:  -Group instruction provided by PowerPoint slides, verbal discussion, written materials, and food models to support subject matter. The instructor gives an explanation of serving size versus portion size, changes in portions sizes over the last 20 years, and what consists of a serving from each food group.   Stress Management:  -Group instruction provided by verbal instruction, video, and written materials to support subject matter.  Instructors review role of stress in heart disease and how to cope with stress positively.     Exercising on Your Own:  -Group instruction provided by verbal instruction, power point, and written materials to support subject.  Instructors discuss benefits of exercise, components of exercise, frequency and intensity of exercise, and end points for exercise.  Also discuss use of nitroglycerin and activating EMS.  Review options of places to exercise outside of rehab.  Review guidelines for sex with heart disease.   Cardiac Drugs I:  -Group instruction provided by verbal instruction and written materials to support subject.  Instructor reviews cardiac drug classes: antiplatelets, anticoagulants, beta blockers, and statins.  Instructor discusses reasons, side effects, and lifestyle considerations for each drug class.   Cardiac Drugs II:  -Group instruction provided by verbal instruction and written materials to support subject.  Instructor  reviews cardiac drug classes: angiotensin converting enzyme inhibitors (ACE-I), angiotensin II receptor blockers (ARBs), nitrates, and calcium channel blockers.  Instructor discusses reasons, side effects, and lifestyle considerations for each drug class.  Anatomy and Physiology of the Circulatory System:  Group verbal and written instruction and models provide basic cardiac anatomy and physiology, with the coronary electrical and arterial systems. Review of: AMI, Angina, Valve disease, Heart Failure, Peripheral Artery Disease, Cardiac Arrhythmia, Pacemakers, and the ICD.   Other Education:  -Group or individual verbal, written, or video instructions that support the educational goals of the cardiac rehab program.   Holiday Eating Survival Tips:  -Group instruction provided by PowerPoint slides, verbal discussion, and written materials to support subject matter. The instructor gives patients tips, tricks, and techniques to help them not only survive but enjoy the holidays despite the onslaught of food that accompanies the holidays.   Knowledge Questionnaire Score: Knowledge Questionnaire Score - 04/30/18 1344      Knowledge Questionnaire Score   Pre Score  --   Did not finish quiz.      Core Components/Risk Factors/Patient Goals at Admission: Personal Goals and Risk Factors at Admission - 04/30/18 1147      Core Components/Risk Factors/Patient Goals on Admission    Weight Management  Yes;Weight Maintenance    Intervention  Weight Management: Develop a combined nutrition and exercise program designed to reach desired caloric intake, while maintaining appropriate intake of nutrient and fiber, sodium and fats, and appropriate energy expenditure required for the weight goal.;Weight Management: Provide education and appropriate resources to help participant work on and attain dietary goals.;Weight Management/Obesity: Establish reasonable short term and long term weight goals.    Admit  Weight  179 lb 3.7 oz (81.3 kg)    Expected Outcomes  Long Term: Adherence to nutrition and physical activity/exercise program aimed toward attainment of established weight goal;Short Term: Continue to assess and modify interventions until short term weight is achieved;Understanding recommendations for meals to include 15-35% energy as protein, 25-35% energy from fat, 35-60% energy from carbohydrates, less than 200mg  of dietary cholesterol, 20-35 gm of total fiber daily;Understanding of distribution of calorie intake throughout the day with the consumption of 4-5 meals/snacks;Weight Maintenance: Understanding of the daily nutrition guidelines, which includes 25-35% calories from fat, 7% or less cal from saturated fats, less than 200mg  cholesterol, less than 1.5gm of sodium, & 5 or more servings of fruits and vegetables daily    Improve shortness of breath with ADL's  Yes    Intervention  Provide education, individualized exercise plan and daily activity instruction to help decrease symptoms of SOB with activities of daily living.    Expected Outcomes  Short Term: Improve cardiorespiratory fitness to achieve a reduction of symptoms when performing ADLs;Long Term: Be able to perform more ADLs without symptoms or delay the onset of symptoms    Diabetes  Yes    Intervention  Provide education about signs/symptoms and action to take for hypo/hyperglycemia.;Provide education about proper nutrition, including hydration, and aerobic/resistive exercise prescription along with prescribed medications to achieve blood glucose in normal ranges: Fasting glucose 65-99 mg/dL    Expected Outcomes  Long Term: Attainment of HbA1C < 7%.;Short Term: Participant verbalizes understanding of the signs/symptoms and immediate care of hyper/hypoglycemia, proper foot care and importance of medication, aerobic/resistive exercise and nutrition plan for blood glucose control.    Hypertension  Yes    Intervention  Monitor prescription use  compliance.;Provide education on lifestyle modifcations including regular physical activity/exercise, weight management, moderate sodium restriction and increased consumption of fresh fruit, vegetables, and low fat dairy, alcohol moderation, and smoking cessation.    Expected Outcomes  Short Term: Continued assessment and intervention until BP  is < 140/37mm HG in hypertensive participants. < 130/45mm HG in hypertensive participants with diabetes, heart failure or chronic kidney disease.;Long Term: Maintenance of blood pressure at goal levels.    Lipids  Yes    Intervention  Provide education and support for participant on nutrition & aerobic/resistive exercise along with prescribed medications to achieve LDL 70mg , HDL >40mg .    Expected Outcomes  Short Term: Participant states understanding of desired cholesterol values and is compliant with medications prescribed. Participant is following exercise prescription and nutrition guidelines.;Long Term: Cholesterol controlled with medications as prescribed, with individualized exercise RX and with personalized nutrition plan. Value goals: LDL < 70mg , HDL > 40 mg.       Core Components/Risk Factors/Patient Goals Review:    Core Components/Risk Factors/Patient Goals at Discharge (Final Review):    ITP Comments: ITP Comments    Row Name 04/30/18 0756           ITP Comments  Dr. Fransico Him. Medical Director           Comments: Patient attended orientation from [phone] to [phone] to review rules and guidelines for program. Completed 6 minute walk test, Intitial ITP, and exercise prescription.  VSS. Telemetry-SR with T wave abnormality. Noted in most recent EKG.  Asymptomatic.

## 2018-04-30 NOTE — Progress Notes (Signed)
Cardiac Rehab Medication Review by Registered Nurse   Does the patient  feel that his/her medications are working for him/her?  yes  Has the patient been experiencing any side effects to the medications prescribed?  no  Does the patient measure his/her own blood pressure or blood glucose at home?  yes   Does the patient have any problems obtaining medications due to transportation or finances?   no  Understanding of regimen: good Understanding of indications: good Potential of compliance: excellent    RN comments:  Pt demonstrates good understanding and compliance with medication regimen.  Pt expresses concern about financial cost of medication however this does not prevent him obtaining them. Pt checks CBG daily and he verbalizes he is very pleased with his diabetic control.  Andi Hence, RN, BSN Cardiac Pulmonary Rehab      Nancie Neas Rion 04/30/2018 7:58 AM

## 2018-04-30 NOTE — Progress Notes (Signed)
[name] 67 y.o. male DOB: 27-Jan-1951 MRN: [id number]      Nutrition Note  1. S/P CABG (coronary artery bypass graft)   2. S/P AVR    Past Medical History:  Diagnosis Date  . Abnormal myocardial perfusion study 2006   EF 44% ? inferoseptal ischemia. no cath done  . Breast mass, right 03/2008  . Contact dermatitis 01/31/2013  . COPD (chronic obstructive pulmonary disease) ([place])    "dx'd 12/2017"  . Coronary artery disease   . Heart murmur   . History of colonic polyps   . Hyperlipidemia   . Hypertension   . Low back pain   . Type II diabetes mellitus ([place])    Meds reviewed.   Current Outpatient Medications (Endocrine & Metabolic):  .  dapagliflozin propanediol (FARXIGA) 5 MG TABS tablet, Take 5 mg by mouth daily. .  insulin aspart (NOVOLOG FLEXPEN) 100 UNIT/ML FlexPen, INJECT 11-15 SUBCUTANEOUSLY DAILY BEFORE SUPPER (Patient taking differently: Inject 11 Units into the skin daily before supper. ) .  insulin detemir (LEVEMIR) 100 UNIT/ML injection, 40 units once daily .  metFORMIN (GLUCOPHAGE-XR) 500 MG 24 hr tablet, Take 2 tablets (1,000 mg total) by mouth 2 (two) times daily.  Current Outpatient Medications (Cardiovascular):  .  furosemide (LASIX) 20 MG tablet, Take 1 tablet (20 mg total) by mouth daily. Marland Kitchen  lisinopril (PRINIVIL,ZESTRIL) 20 MG tablet, Take 1 tablet (20 mg total) by mouth daily. .  metoprolol tartrate (LOPRESSOR) 50 MG tablet, Take 1 tablet (50 mg total) by mouth 2 (two) times daily. .  rosuvastatin (CRESTOR) 20 MG tablet, TAKE 1 TABLET BY MOUTH EVERY DAY   Current Outpatient Medications (Analgesics):  .  aspirin EC 325 MG EC tablet, Take 1 tablet (325 mg total) by mouth daily.  Current Outpatient Medications (Hematological):  Marland Kitchen  Cyanocobalamin (VITAMIN B-12 PO), Take 1 tablet by mouth daily.  Current Outpatient Medications (Other):  .  glucose blood (PRODIGY NO CODING BLOOD GLUC) test strip, Use as instructed to check blood sugar twice a day.  DX   E11.40 .  Insulin Pen Needle (B-D ULTRAFINE III SHORT PEN) 31G X 8 MM MISC, USE AS DIRECTED   HT: Ht Readings from Last 1 Encounters:  04/24/18 6\' 4"  (1.93 m)    WT: Wt Readings from Last 5 Encounters:  04/24/18 177 lb 12.8 oz (80.6 kg)  03/31/18 167 lb 3.2 oz (75.8 kg)  03/20/18 172 lb 1.9 oz (78.1 kg)  03/12/18 173 lb 12.8 oz (78.8 kg)  03/10/18 171 lb 4.8 oz (77.7 kg)     There is no height or weight on file to calculate BMI.   Current tobacco use? No  Labs:  Lipid Panel     Component Value Date/Time   CHOL 137 05/20/2017 1129   TRIG 60.0 05/20/2017 1129   HDL 42.10 05/20/2017 1129   CHOLHDL 3 05/20/2017 1129   VLDL 12.0 05/20/2017 1129   LDLCALC 83 05/20/2017 1129   LDLDIRECT 177.6 10/07/2007 1001    Lab Results  Component Value Date   HGBA1C 7.3 (H) 03/02/2018   CBG (last 3)  No results for input(s): GLUCAP in the last 72 hours.  Nutrition Note Spoke with pt. Nutrition plan and goals reviewed with pt. Pt is following Step 2 of the Therapeutic Lifestyle Changes diet. Heart healthy eating tips reviewed (label reading, how to build a healthy plate, portion sizes, eating frequently across the day). Pt shared he usually eats ~2 meals a day, will occasionally have  a small snack for breakfast. Discussed with patient the importance of eating regularly across the day to manage blood glucose levels. Pt is diabetic. Last A1c indicates blood glucose well-controlled. This Probation officer went over Diabetes Education test results. Pt checks CBG's 1x times a day. Fasting CBG's reportedly 110-120 mg/dL. Per discussion, pt does not use canned/convenience foods often. Pt rarely adds salt to food. Pt eats out infrequently. Pt expressed understanding of the information reviewed. Pt aware of nutrition education classes offered and plans on attending nutrition classes.  Nutrition Diagnosis ? Food-and nutrition-related knowledge deficit related to lack of exposure to information as related to  diagnosis of: ? CVD ? DM   Nutrition Intervention ? Pt's individual nutrition plan and goals reviewed with pt  Nutrition Goal(s):  ? Pt to identify and limit food sources of saturated fat, trans fat, refined carbohydrates and sodium ? Improved blood glucose control as evidenced by pt's A1c trending from 7.3 toward less than 7.0. ? Pt able to name foods that affect blood glucose.   Plan:  ? Pt to attend nutrition classes ? Nutrition I ? Nutrition II ? Portion Distortion  ? Diabetes Blitz ? Diabetes Q & Ae determined ? Will provide client-centered nutrition education as part of interdisciplinary care ? Monitor and evaluate progress toward nutrition goal with team.   Laurina Bustle, MS, RD, LDN 04/30/2018 8:55 AM

## 2018-05-06 ENCOUNTER — Encounter (HOSPITAL_COMMUNITY): Payer: Medicare Other

## 2018-05-06 ENCOUNTER — Encounter (HOSPITAL_COMMUNITY)
Admission: RE | Admit: 2018-05-06 | Discharge: 2018-05-06 | Disposition: A | Discharge status: Home / Self Care | Payer: Medicare Other | Source: Ambulatory Visit | Attending: Cardiology | Admitting: Cardiology

## 2018-05-06 DIAGNOSIS — Z951 Presence of aortocoronary bypass graft: Secondary | ICD-10-CM | POA: Diagnosis not present

## 2018-05-06 DIAGNOSIS — Z7982 Long term (current) use of aspirin: Secondary | ICD-10-CM | POA: Diagnosis not present

## 2018-05-06 DIAGNOSIS — Z87891 Personal history of nicotine dependence: Secondary | ICD-10-CM | POA: Diagnosis not present

## 2018-05-06 DIAGNOSIS — I2510 Atherosclerotic heart disease of native coronary artery without angina pectoris: Secondary | ICD-10-CM | POA: Diagnosis not present

## 2018-05-06 DIAGNOSIS — I10 Essential (primary) hypertension: Secondary | ICD-10-CM | POA: Diagnosis not present

## 2018-05-06 DIAGNOSIS — Z952 Presence of prosthetic heart valve: Secondary | ICD-10-CM | POA: Diagnosis not present

## 2018-05-06 LAB — GLUCOSE, CAPILLARY
Glucose-Capillary: 89 mg/dL (ref 70–99)
Glucose-Capillary: 116 mg/dL — ABNORMAL HIGH (ref 70–99)

## 2018-05-06 NOTE — Progress Notes (Signed)
[name] 67 y.o. male Nutrition Note Spoke with pt. Nutrition plan and goals reviewed with pt. Pt is following a heart healthy diet. Reeducated pt on heart healthy eating tips (label reading, how to build a healthy plate, portion sizes, eating frequently across the day). Pt shared he usually eats ~2 meals a day, will occasionally have a small snack for breakfast. Pt disclosed he has some familial pressures that dictate what he is allowed to eat. Invited pt to bring family in to discuss how heart healthy eating can include multiple meals and food groups. Additionally sent patient home with handouts and RD contact information in case family has any questions. Discussed with patient the importance of eating regularly across the day to manage blood glucose levels. Pt is diabetic. Last A1c indicates blood glucose well-controlled. This Probation officer went over Diabetes Education test results. Pt checks CBG's 1x times a day. Fasting CBG's reportedly 110-120 mg/dL. Per discussion, pt does not use canned/convenience foods often. Pt rarely adds salt to food. Pt eats out infrequently. Pt expressed understanding of the information reviewed. Pt aware of nutrition education classes offered and plans on attending nutrition classes.  Lab Results  Component Value Date   HGBA1C 7.3 (H) 03/02/2018    Wt Readings from Last 3 Encounters:  04/30/18 179 lb 3.7 oz (81.3 kg)  04/24/18 177 lb 12.8 oz (80.6 kg)  03/31/18 167 lb 3.2 oz (75.8 kg)    Nutrition Diagnosis ? Food-and nutrition-related knowledge deficit related to lack of exposure to information as related to diagnosis of: ? CVD ?  DM   Nutrition Intervention ? Pt's individual nutrition plan reviewed with pt. ? Benefits of adopting Heart Healthy diet discussed when Medficts reviewed.    Goal(s)  Pt to identify and limit food sources of saturated fat, trans fat, refined carbohydrates and sodium  Improved blood glucose control as evidenced by pt's A1c  trending from 7.3 toward less than 7.0.  Pt able to name foods that affect blood glucose.  Plan:   Pt to attend nutrition classes ? Nutrition I ? Nutrition II ? Portion Distortion   Will provide client-centered nutrition education as part of interdisciplinary care  Monitor and evaluate progress toward nutrition goal with team.    Laurina Bustle, MS, RD, LDN 05/06/2018 2:38 PM

## 2018-05-06 NOTE — Progress Notes (Signed)
Daily Session Note  Patient Details  Name: [name] MRN: [id number] Date of Birth: May 07, 1951 Referring Provider:     CARDIAC REHAB PHASE II ORIENTATION from 04/30/2018 in [place]  Referring Provider  [name]       Encounter Date: 05/06/2018  Check In: Session Check In - 05/06/18 0957      Check-In   Supervising physician immediately available to respond to emergencies  [facility] immediately available    Physician(s)  [name]    Location  [facility]    Staff Present  [name], RN, [name], MS, ACSM CEP, Exercise Physiologist;[name], RN, [name], MS,ACSM CEP, Exercise Physiologist    Medication changes reported      No    Fall or balance concerns reported     No    Tobacco Cessation  No Change    Warm-up and Cool-down  Performed as group-led instruction    Resistance Training Performed  No    VAD Patient?  No    PAD/SET Patient?  No      Pain Assessment   Currently in Pain?  No/denies    Pain Score  0-No pain    Multiple Pain Sites  No       Capillary Blood Glucose: Results for orders placed or performed during the hospital encounter of 05/06/18 (from the past 24 hour(s))  Glucose, capillary     Status: None   Collection Time: 05/06/18 10:46 AM  Result Value Ref Range   Glucose-Capillary 89 70 - 99 mg/dL  Glucose, capillary     Status: Abnormal   Collection Time: 05/06/18 11:43 AM  Result Value Ref Range   Glucose-Capillary 116 (H) 70 - 99 mg/dL      Social History   Tobacco Use  Smoking Status Former Smoker  . Packs/day: 1.00  . Years: 33.00  . Pack years: 33.00  . Types: Cigarettes  Smokeless Tobacco Never Used  Tobacco Comment   "quit in ~ 2007"    Goals Met:  No report of cardiac concerns or symptoms  Goals Unmet:  Not Applicable  Comments: Pt started cardiac rehab today.  Pt tolerated light exercise without difficulty. VSS,  telemetry-Sinus Rhythm with an inverted T wave, asymptomatic.  Medication list reconciled. Pt denies barriers to medicaiton compliance.  PSYCHOSOCIAL ASSESSMENT:  PHQ-0. Pt exhibits positive coping skills, hopeful outlook with supportive family. No psychosocial needs identified at this time, no psychosocial interventions necessary.    Pt enjoys watching TV.   Pt oriented to exercise equipment and routine.    Understanding verbalized. Post exercise CBG 89. Patient asymptomatic. Patient was given Gatorade. Recheck CBG 116.[name], RN,BSN 05/06/2018 1:41 PM   Dr. Fransico Him is Medical Director for Cardiac Rehab at [facility].

## 2018-05-07 DIAGNOSIS — I10 Essential (primary) hypertension: Secondary | ICD-10-CM | POA: Diagnosis not present

## 2018-05-08 ENCOUNTER — Encounter (HOSPITAL_COMMUNITY): Payer: Medicare Other

## 2018-05-08 ENCOUNTER — Telehealth: Payer: Self-pay | Admitting: Family

## 2018-05-08 ENCOUNTER — Encounter (HOSPITAL_COMMUNITY)
Admission: RE | Admit: 2018-05-08 | Discharge: 2018-05-08 | Disposition: A | Discharge status: Home / Self Care | Payer: Medicare Other | Source: Ambulatory Visit | Attending: Cardiology | Admitting: Cardiology

## 2018-05-08 DIAGNOSIS — Z951 Presence of aortocoronary bypass graft: Secondary | ICD-10-CM | POA: Diagnosis not present

## 2018-05-08 DIAGNOSIS — I10 Essential (primary) hypertension: Secondary | ICD-10-CM | POA: Diagnosis not present

## 2018-05-08 DIAGNOSIS — Z952 Presence of prosthetic heart valve: Secondary | ICD-10-CM | POA: Diagnosis not present

## 2018-05-08 DIAGNOSIS — I2510 Atherosclerotic heart disease of native coronary artery without angina pectoris: Secondary | ICD-10-CM | POA: Diagnosis not present

## 2018-05-08 DIAGNOSIS — Z7982 Long term (current) use of aspirin: Secondary | ICD-10-CM | POA: Diagnosis not present

## 2018-05-08 DIAGNOSIS — Z87891 Personal history of nicotine dependence: Secondary | ICD-10-CM | POA: Diagnosis not present

## 2018-05-08 LAB — BASIC METABOLIC PANEL
BUN / CREAT RATIO: 10 (ref 10–24)
BUN: 9 mg/dL (ref 8–27)
CO2: 24 mmol/L (ref 20–29)
Calcium: 9.7 mg/dL (ref 8.6–10.2)
Chloride: 104 mmol/L (ref 96–106)
Creatinine, Ser: 0.88 mg/dL (ref 0.76–1.27)
GFR calc Af Amer: 103 mL/min/{1.73_m2} (ref >59)
GFR calc non Af Amer: 90 mL/min/{1.73_m2} (ref >59)
GLUCOSE: 116 mg/dL — AB (ref 65–99)
Potassium: 4.9 mmol/L (ref 3.5–5.2)
Sodium: 144 mmol/L (ref 134–144)

## 2018-05-08 NOTE — Telephone Encounter (Signed)
Lisinopril refill. Previously prescribed by a different provider Last Refill: 03/11/18 #30 with 1 refill Last OV: 03/12/18 PCP: Lenna Sciara O'Sullivan,NP Pharmacy:[facility] [address]

## 2018-05-08 NOTE — Telephone Encounter (Signed)
Copied from [place] [phone]. Topic: Quick Communication - Rx Refill/Question >> May 08, 2018 12:44 PM Keene Breath wrote: Medication: lisinopril (PRINIVIL,ZESTRIL) 20 MG tablet  Patient called to request a refill for the above medication.  CB# [phone]  Preferred Pharmacy (with phone number or street name): [id number] [address], [place]. [phone] (Phone) [phone] (Fax)

## 2018-05-11 ENCOUNTER — Telehealth: Payer: Self-pay | Admitting: Family

## 2018-05-11 ENCOUNTER — Encounter (HOSPITAL_COMMUNITY): Payer: Medicare Other

## 2018-05-11 ENCOUNTER — Ambulatory Visit (HOSPITAL_BASED_OUTPATIENT_CLINIC_OR_DEPARTMENT_OTHER): Admission: RE | Admit: 2018-05-11 | Payer: Medicare Other | Source: Ambulatory Visit

## 2018-05-11 ENCOUNTER — Encounter (HOSPITAL_COMMUNITY)
Admission: RE | Admit: 2018-05-11 | Discharge: 2018-05-11 | Disposition: A | Discharge status: Home / Self Care | Payer: Medicare Other | Source: Ambulatory Visit | Attending: Cardiology | Admitting: Cardiology

## 2018-05-11 DIAGNOSIS — Z952 Presence of prosthetic heart valve: Secondary | ICD-10-CM

## 2018-05-11 DIAGNOSIS — Z951 Presence of aortocoronary bypass graft: Secondary | ICD-10-CM | POA: Diagnosis not present

## 2018-05-11 DIAGNOSIS — I10 Essential (primary) hypertension: Secondary | ICD-10-CM | POA: Diagnosis not present

## 2018-05-11 DIAGNOSIS — Z7982 Long term (current) use of aspirin: Secondary | ICD-10-CM | POA: Diagnosis not present

## 2018-05-11 DIAGNOSIS — Z87891 Personal history of nicotine dependence: Secondary | ICD-10-CM | POA: Diagnosis not present

## 2018-05-11 DIAGNOSIS — I2510 Atherosclerotic heart disease of native coronary artery without angina pectoris: Secondary | ICD-10-CM | POA: Diagnosis not present

## 2018-05-11 LAB — GLUCOSE, CAPILLARY: GLUCOSE-CAPILLARY: 151 mg/dL — AB (ref 70–99)

## 2018-05-11 MED ORDER — METOPROLOL TARTRATE 50 MG PO TABS: 50.0000 mg | ORAL_TABLET | Freq: Two times a day (BID) | ORAL | 2 refills | Status: DC

## 2018-05-11 NOTE — Telephone Encounter (Signed)
Copied from [place] [phone]. Topic: Quick Communication - Rx Refill/Question >> May 11, 2018  8:28 AM Alfredia Ferguson R wrote: Medication: metoprolol tartrate (LOPRESSOR) 50 MG tablet  Has the patient contacted their pharmacy? Yes  Preferred Pharmacy (with phone number or street name): [id number] [address], [place]. [phone] (Phone) [phone] (Fax)

## 2018-05-11 NOTE — Telephone Encounter (Signed)
Melissa -- medication previously prescribed by cardiology. Ok to refill or send to cardiology?

## 2018-05-13 ENCOUNTER — Encounter (HOSPITAL_COMMUNITY)
Admission: RE | Admit: 2018-05-13 | Discharge: 2018-05-13 | Disposition: A | Discharge status: Home / Self Care | Payer: Medicare Other | Source: Ambulatory Visit | Attending: Cardiology | Admitting: Cardiology

## 2018-05-13 ENCOUNTER — Encounter (HOSPITAL_COMMUNITY): Payer: Medicare Other

## 2018-05-13 DIAGNOSIS — I10 Essential (primary) hypertension: Secondary | ICD-10-CM | POA: Diagnosis not present

## 2018-05-13 DIAGNOSIS — Z951 Presence of aortocoronary bypass graft: Secondary | ICD-10-CM | POA: Diagnosis not present

## 2018-05-13 DIAGNOSIS — Z87891 Personal history of nicotine dependence: Secondary | ICD-10-CM | POA: Diagnosis not present

## 2018-05-13 DIAGNOSIS — Z7982 Long term (current) use of aspirin: Secondary | ICD-10-CM | POA: Diagnosis not present

## 2018-05-13 DIAGNOSIS — I2510 Atherosclerotic heart disease of native coronary artery without angina pectoris: Secondary | ICD-10-CM | POA: Diagnosis not present

## 2018-05-13 DIAGNOSIS — Z952 Presence of prosthetic heart valve: Secondary | ICD-10-CM

## 2018-05-15 ENCOUNTER — Encounter (HOSPITAL_COMMUNITY)
Admission: RE | Admit: 2018-05-15 | Discharge: 2018-05-15 | Disposition: A | Discharge status: Home / Self Care | Payer: Medicare Other | Source: Ambulatory Visit | Attending: Cardiology | Admitting: Cardiology

## 2018-05-15 ENCOUNTER — Encounter (HOSPITAL_COMMUNITY): Payer: Medicare Other

## 2018-05-15 DIAGNOSIS — I2510 Atherosclerotic heart disease of native coronary artery without angina pectoris: Secondary | ICD-10-CM | POA: Diagnosis not present

## 2018-05-15 DIAGNOSIS — Z952 Presence of prosthetic heart valve: Secondary | ICD-10-CM | POA: Diagnosis not present

## 2018-05-15 DIAGNOSIS — I10 Essential (primary) hypertension: Secondary | ICD-10-CM | POA: Diagnosis not present

## 2018-05-15 DIAGNOSIS — Z951 Presence of aortocoronary bypass graft: Secondary | ICD-10-CM | POA: Diagnosis not present

## 2018-05-15 DIAGNOSIS — Z87891 Personal history of nicotine dependence: Secondary | ICD-10-CM | POA: Diagnosis not present

## 2018-05-15 DIAGNOSIS — Z7982 Long term (current) use of aspirin: Secondary | ICD-10-CM | POA: Diagnosis not present

## 2018-05-18 ENCOUNTER — Encounter (HOSPITAL_COMMUNITY): Payer: Medicare Other

## 2018-05-18 NOTE — Progress Notes (Signed)
Reviewed home exercise guidelines with patient including endpoints, temperature precautions, target heart rate and rate of perceived exertion. Pt plans to plans as his mode of home exercise. Pt voices understanding of instructions given. Sol Passer, MS, ACSM CEP

## 2018-05-20 ENCOUNTER — Encounter (HOSPITAL_COMMUNITY)
Admission: RE | Admit: 2018-05-20 | Discharge: 2018-05-20 | Disposition: A | Discharge status: Home / Self Care | Payer: Medicare Other | Source: Ambulatory Visit | Attending: Cardiology | Admitting: Cardiology

## 2018-05-20 ENCOUNTER — Encounter (HOSPITAL_COMMUNITY): Payer: Medicare Other

## 2018-05-20 DIAGNOSIS — Z951 Presence of aortocoronary bypass graft: Secondary | ICD-10-CM | POA: Diagnosis not present

## 2018-05-20 DIAGNOSIS — Z87891 Personal history of nicotine dependence: Secondary | ICD-10-CM | POA: Diagnosis not present

## 2018-05-20 DIAGNOSIS — Z952 Presence of prosthetic heart valve: Secondary | ICD-10-CM

## 2018-05-20 DIAGNOSIS — Z7982 Long term (current) use of aspirin: Secondary | ICD-10-CM | POA: Diagnosis not present

## 2018-05-20 DIAGNOSIS — I2510 Atherosclerotic heart disease of native coronary artery without angina pectoris: Secondary | ICD-10-CM | POA: Diagnosis not present

## 2018-05-20 DIAGNOSIS — I10 Essential (primary) hypertension: Secondary | ICD-10-CM | POA: Diagnosis not present

## 2018-05-21 ENCOUNTER — Other Ambulatory Visit: Payer: Self-pay | Admitting: Cardiology

## 2018-05-21 ENCOUNTER — Ambulatory Visit (HOSPITAL_BASED_OUTPATIENT_CLINIC_OR_DEPARTMENT_OTHER)
Admission: RE | Admit: 2018-05-21 | Discharge: 2018-05-21 | Disposition: A | Discharge status: Home / Self Care | Payer: Medicare Other | Source: Ambulatory Visit | Attending: Cardiology | Admitting: Cardiology

## 2018-05-21 DIAGNOSIS — E119 Type 2 diabetes mellitus without complications: Secondary | ICD-10-CM | POA: Insufficient documentation

## 2018-05-21 DIAGNOSIS — Z952 Presence of prosthetic heart valve: Secondary | ICD-10-CM

## 2018-05-21 DIAGNOSIS — I2510 Atherosclerotic heart disease of native coronary artery without angina pectoris: Secondary | ICD-10-CM | POA: Diagnosis not present

## 2018-05-21 DIAGNOSIS — Z953 Presence of xenogenic heart valve: Secondary | ICD-10-CM | POA: Diagnosis not present

## 2018-05-21 DIAGNOSIS — E785 Hyperlipidemia, unspecified: Secondary | ICD-10-CM | POA: Diagnosis not present

## 2018-05-21 DIAGNOSIS — I119 Hypertensive heart disease without heart failure: Secondary | ICD-10-CM | POA: Insufficient documentation

## 2018-05-21 DIAGNOSIS — I10 Essential (primary) hypertension: Secondary | ICD-10-CM | POA: Diagnosis not present

## 2018-05-21 DIAGNOSIS — J449 Chronic obstructive pulmonary disease, unspecified: Secondary | ICD-10-CM | POA: Diagnosis not present

## 2018-05-21 DIAGNOSIS — I080 Rheumatic disorders of both mitral and aortic valves: Secondary | ICD-10-CM | POA: Insufficient documentation

## 2018-05-21 NOTE — Progress Notes (Signed)
  Echocardiogram 2D Echocardiogram has been performed.  [name] [name] 05/21/2018, 11:37 AM

## 2018-05-22 ENCOUNTER — Encounter (HOSPITAL_COMMUNITY): Payer: Medicare Other

## 2018-05-22 ENCOUNTER — Encounter (HOSPITAL_COMMUNITY)
Admission: RE | Admit: 2018-05-22 | Discharge: 2018-05-22 | Disposition: A | Discharge status: Home / Self Care | Payer: Medicare Other | Source: Ambulatory Visit | Attending: Cardiology | Admitting: Cardiology

## 2018-05-22 DIAGNOSIS — Z87891 Personal history of nicotine dependence: Secondary | ICD-10-CM | POA: Diagnosis not present

## 2018-05-22 DIAGNOSIS — Z951 Presence of aortocoronary bypass graft: Secondary | ICD-10-CM | POA: Diagnosis not present

## 2018-05-22 DIAGNOSIS — Z7982 Long term (current) use of aspirin: Secondary | ICD-10-CM | POA: Diagnosis not present

## 2018-05-22 DIAGNOSIS — I2510 Atherosclerotic heart disease of native coronary artery without angina pectoris: Secondary | ICD-10-CM | POA: Diagnosis not present

## 2018-05-22 DIAGNOSIS — Z952 Presence of prosthetic heart valve: Secondary | ICD-10-CM

## 2018-05-22 DIAGNOSIS — I10 Essential (primary) hypertension: Secondary | ICD-10-CM | POA: Diagnosis not present

## 2018-05-25 ENCOUNTER — Encounter (HOSPITAL_COMMUNITY)
Admission: RE | Admit: 2018-05-25 | Discharge: 2018-05-25 | Disposition: A | Discharge status: Home / Self Care | Payer: Medicare Other | Source: Ambulatory Visit | Attending: Cardiology | Admitting: Cardiology

## 2018-05-25 ENCOUNTER — Encounter (HOSPITAL_COMMUNITY): Payer: Medicare Other

## 2018-05-25 DIAGNOSIS — I10 Essential (primary) hypertension: Secondary | ICD-10-CM | POA: Diagnosis not present

## 2018-05-25 DIAGNOSIS — Z951 Presence of aortocoronary bypass graft: Secondary | ICD-10-CM

## 2018-05-25 DIAGNOSIS — Z7982 Long term (current) use of aspirin: Secondary | ICD-10-CM | POA: Diagnosis not present

## 2018-05-25 DIAGNOSIS — Z952 Presence of prosthetic heart valve: Secondary | ICD-10-CM

## 2018-05-25 DIAGNOSIS — Z87891 Personal history of nicotine dependence: Secondary | ICD-10-CM | POA: Diagnosis not present

## 2018-05-25 DIAGNOSIS — I2510 Atherosclerotic heart disease of native coronary artery without angina pectoris: Secondary | ICD-10-CM | POA: Diagnosis not present

## 2018-05-27 ENCOUNTER — Encounter (HOSPITAL_COMMUNITY)
Admission: RE | Admit: 2018-05-27 | Discharge: 2018-05-27 | Disposition: A | Discharge status: Home / Self Care | Payer: Medicare Other | Source: Ambulatory Visit | Attending: Cardiology | Admitting: Cardiology

## 2018-05-27 ENCOUNTER — Encounter (HOSPITAL_COMMUNITY): Payer: Medicare Other

## 2018-05-27 DIAGNOSIS — Z952 Presence of prosthetic heart valve: Secondary | ICD-10-CM | POA: Insufficient documentation

## 2018-05-27 DIAGNOSIS — Z87891 Personal history of nicotine dependence: Secondary | ICD-10-CM | POA: Insufficient documentation

## 2018-05-27 DIAGNOSIS — I2510 Atherosclerotic heart disease of native coronary artery without angina pectoris: Secondary | ICD-10-CM | POA: Insufficient documentation

## 2018-05-27 DIAGNOSIS — I10 Essential (primary) hypertension: Secondary | ICD-10-CM | POA: Insufficient documentation

## 2018-05-27 DIAGNOSIS — Z951 Presence of aortocoronary bypass graft: Secondary | ICD-10-CM | POA: Diagnosis not present

## 2018-05-27 DIAGNOSIS — Z86010 Personal history of colonic polyps: Secondary | ICD-10-CM | POA: Insufficient documentation

## 2018-05-27 DIAGNOSIS — Z794 Long term (current) use of insulin: Secondary | ICD-10-CM | POA: Insufficient documentation

## 2018-05-27 DIAGNOSIS — E119 Type 2 diabetes mellitus without complications: Secondary | ICD-10-CM | POA: Diagnosis not present

## 2018-05-27 DIAGNOSIS — E785 Hyperlipidemia, unspecified: Secondary | ICD-10-CM | POA: Insufficient documentation

## 2018-05-27 DIAGNOSIS — Z79899 Other long term (current) drug therapy: Secondary | ICD-10-CM | POA: Insufficient documentation

## 2018-05-27 DIAGNOSIS — J449 Chronic obstructive pulmonary disease, unspecified: Secondary | ICD-10-CM | POA: Diagnosis not present

## 2018-05-27 DIAGNOSIS — Z7982 Long term (current) use of aspirin: Secondary | ICD-10-CM | POA: Diagnosis not present

## 2018-05-28 NOTE — Progress Notes (Signed)
Cardiac Individual Treatment Plan  Patient Details  Name: [name] MRN: [id number] Date of Birth: [birth year] Referring Provider:     CARDIAC REHAB PHASE II ORIENTATION from 04/30/2018 in [place]  Referring Provider  [name]       Initial Encounter Date:    CARDIAC REHAB PHASE II ORIENTATION from 04/30/2018 in [place]  Date  04/30/18      Visit Diagnosis: S/P CABG (coronary artery bypass graft)  S/P AVR  Patient's Home Medications on Admission:  Current Outpatient Medications:  .  aspirin EC 325 MG EC tablet, Take 1 tablet (325 mg total) by mouth daily., Disp: , Rfl: 0 .  Cyanocobalamin (VITAMIN B-12 PO), Take 1 tablet by mouth daily., Disp: , Rfl:  .  dapagliflozin propanediol (FARXIGA) 5 MG TABS tablet, Take 5 mg by mouth daily., Disp: 30 tablet, Rfl: 5 .  furosemide (LASIX) 20 MG tablet, TAKE 1 TABLET BY MOUTH EVERY DAY, Disp: 90 tablet, Rfl: 2 .  glucose blood (PRODIGY NO CODING BLOOD GLUC) test strip, Use as instructed to check blood sugar twice a day.  DX  E11.40, Disp: 100 each, Rfl: 1 .  insulin aspart (NOVOLOG FLEXPEN) 100 UNIT/ML FlexPen, INJECT 11-15 SUBCUTANEOUSLY DAILY BEFORE SUPPER (Patient taking differently: Inject 11 Units into the skin daily before supper. ), Disp: 15 mL, Rfl: 3 .  insulin detemir (LEVEMIR) 100 UNIT/ML injection, 40 units once daily, Disp: 10 mL, Rfl: 5 .  Insulin Pen Needle (B-D ULTRAFINE III SHORT PEN) 31G X 8 MM MISC, USE AS DIRECTED, Disp: 100 each, Rfl: 5 .  lisinopril (PRINIVIL,ZESTRIL) 20 MG tablet, Take 1 tablet (20 mg total) by mouth daily., Disp: 30 tablet, Rfl: 1 .  metFORMIN (GLUCOPHAGE-XR) 500 MG 24 hr tablet, Take 2 tablets (1,000 mg total) by mouth 2 (two) times daily., Disp: 360 tablet, Rfl: 1 .  metoprolol tartrate (LOPRESSOR) 50 MG tablet, Take 1 tablet (50 mg total) by mouth 2 (two) times daily., Disp: 60 tablet, Rfl: 2 .  rosuvastatin  (CRESTOR) 20 MG tablet, TAKE 1 TABLET BY MOUTH EVERY DAY, Disp: 90 tablet, Rfl: 1  Past Medical History: Past Medical History:  Diagnosis Date  . Abnormal myocardial perfusion study 2006   EF 44% ? inferoseptal ischemia. no cath done  . Breast mass, right 03/2008  . Contact dermatitis 01/31/2013  . COPD (chronic obstructive pulmonary disease) ([place])    "dx'd 12/2017"  . Coronary artery disease   . Heart murmur   . History of colonic polyps   . Hyperlipidemia   . Hypertension   . Low back pain   . Type II diabetes mellitus ([facility])     Tobacco Use: Social History   Tobacco Use  Smoking Status Former Smoker  . Packs/day: 1.00  . Years: 33.00  . Pack years: 33.00  . Types: Cigarettes  Smokeless Tobacco Never Used  Tobacco Comment   "quit in ~ 2007"    Labs: Recent Review Flowsheet Data    Labs for ITP Cardiac and Pulmonary Rehab Latest Ref Rng & Units 03/04/2018 03/04/2018 03/04/2018 03/04/2018 03/05/2018   Cholestrol 0 - 200 mg/dL - - - - -   LDLCALC 0 - 99 mg/dL - - - - -   LDLDIRECT mg/dL - - - - -   HDL >39.00 mg/dL - - - - -   Trlycerides 0.0 - 149.0 mg/dL - - - - -   Hemoglobin A1c 4.8 -  5.6 % - - - - -   PHART 7.350 - 7.450 7.315(L) - 7.317(L) 7.315(L) -   PCO2ART 32.0 - 48.0 mmHg 46.7 - 45.8 46.9 -   HCO3 20.0 - 28.0 mmol/L 23.9 - 23.4 23.9 -   TCO2 22 - 32 mmol/L 25 24 25 25 26    ACIDBASEDEF 0.0 - 2.0 mmol/L 3.0(H) - 3.0(H) 2.0 -   O2SAT % 93.0 - 95.0 92.0 -      Capillary Blood Glucose: Lab Results  Component Value Date   GLUCAP 151 (H) 05/11/2018   GLUCAP 116 (H) 05/06/2018   GLUCAP 89 05/06/2018   GLUCAP 150 (H) 03/10/2018   GLUCAP 106 (H) 03/10/2018     Exercise Target Goals: Exercise Program Goal: Individual exercise prescription set using results from initial 6 min walk test and THRR while considering  patient's activity barriers and safety.   Exercise Prescription Goal: Initial exercise prescription builds to 30-45 minutes a day of aerobic activity,  2-3 days per week.  Home exercise guidelines will be given to patient during program as part of exercise prescription that the participant will acknowledge.  Activity Barriers & Risk Stratification: Activity Barriers & Cardiac Risk Stratification - 04/30/18 1134      Activity Barriers & Cardiac Risk Stratification   Activity Barriers  Deconditioning    Cardiac Risk Stratification  High       6 Minute Walk: 6 Minute Walk    Row Name 04/30/18 1132         6 Minute Walk   Phase  Initial     Distance  1600 feet     Walk Time  6 minutes     # of Rest Breaks  0     MPH  3.03     METS  4.29     RPE  12     Perceived Dyspnea   0     VO2 Peak  14.99     Symptoms  No     Resting HR  79 bpm     Resting BP  132/78     Resting Oxygen Saturation   96 %     Exercise Oxygen Saturation  during 6 min walk  96 %     Max Ex. HR  110 bpm     Max Ex. BP  142/82     2 Minute Post BP  142/80        Oxygen Initial Assessment:   Oxygen Re-Evaluation:   Oxygen Discharge (Final Oxygen Re-Evaluation):   Initial Exercise Prescription: Initial Exercise Prescription - 04/30/18 1100      Date of Initial Exercise RX and Referring Provider   Date  04/30/18    Referring Provider  [name]     Expected Discharge Date  07/30/18      Recumbant Bike   Level  2    Watts  50    Minutes  10    METs  3.97      NuStep   Level  2    SPM  75    Minutes  10    METs  2.7      Track   Laps  14    Minutes  10    METs  3.45      Prescription Details   Frequency (times per week)  3x    Duration  Progress to 30 minutes of continuous aerobic without signs/symptoms of physical distress      Intensity  THRR 40-80% of Max Heartrate  69-138    Ratings of Perceived Exertion  11-13    Perceived Dyspnea  0-4      Progression   Progression  Continue progressive overload as per policy without signs/symptoms or physical distress.      Resistance Training   Training Prescription  Yes     Weight  3lbs    Reps  10-15       Perform Capillary Blood Glucose checks as needed.  Exercise Prescription Changes:  Exercise Prescription Changes    Row Name 05/06/18 0947 05/20/18 0947           Response to Exercise   Blood Pressure (Admit)  122/86  122/80      Blood Pressure (Exercise)  140/86  140/78      Blood Pressure (Exit)  112/78  138/80      Heart Rate (Admit)  79 bpm  74 bpm      Heart Rate (Exercise)  89 bpm  96 bpm      Heart Rate (Exit)  77 bpm  74 bpm      Rating of Perceived Exertion (Exercise)  11  12      Symptoms  none  none      Duration  Progress to 30 minutes of  aerobic without signs/symptoms of physical distress  Progress to 30 minutes of  aerobic without signs/symptoms of physical distress      Intensity  THRR unchanged  THRR unchanged        Progression   Progression  Continue to progress workloads to maintain intensity without signs/symptoms of physical distress.  Continue to progress workloads to maintain intensity without signs/symptoms of physical distress.      Average METs  2.5  3.3        Resistance Training   Training Prescription  No Relaxation, no weights today.  No Relaxation, no weights today.        Interval Training   Interval Training  No  No        Recumbant Bike   Level  2  5      Minutes  10  10      METs  2.5  3.9        NuStep   Level  2  4      SPM  75  75      Minutes  10  10      METs  1.9  2.5        Track   Laps  14  14      Minutes  10  10      METs  3.44  3.44        Home Exercise Plan   Plans to continue exercise at  -  Home (comment)      Frequency  -  Add 2 additional days to program exercise sessions.      Initial Home Exercises Provided  -  05/15/18         Exercise Comments:  Exercise Comments    Row Name 05/06/18 1043 05/15/18 1010 05/20/18 1013       Exercise Comments  Patient low intensity exercise well without c/o.  Reviewed home exercise guidelines with patient.  Reviewed METs and  goals with patient.        Exercise Goals and Review:  Exercise Goals    Row Name 04/30/18 1133  Exercise Goals   Increase Physical Activity  Yes       Intervention  Provide advice, education, support and counseling about physical activity/exercise needs.;Develop an individualized exercise prescription for aerobic and resistive training based on initial evaluation findings, risk stratification, comorbidities and participant's personal goals.       Expected Outcomes  Short Term: Attend rehab on a regular basis to increase amount of physical activity.;Long Term: Add in home exercise to make exercise part of routine and to increase amount of physical activity.;Long Term: Exercising regularly at least 3-5 days a week.       Increase Strength and Stamina  Yes       Intervention  Provide advice, education, support and counseling about physical activity/exercise needs.;Develop an individualized exercise prescription for aerobic and resistive training based on initial evaluation findings, risk stratification, comorbidities and participant's personal goals.       Expected Outcomes  Short Term: Increase workloads from initial exercise prescription for resistance, speed, and METs.;Short Term: Perform resistance training exercises routinely during rehab and add in resistance training at home;Long Term: Improve cardiorespiratory fitness, muscular endurance and strength as measured by increased METs and functional capacity (6MWT)       Able to understand and use rate of perceived exertion (RPE) scale  Yes       Intervention  Provide education and explanation on how to use RPE scale       Expected Outcomes  Short Term: Able to use RPE daily in rehab to express subjective intensity level;Long Term:  Able to use RPE to guide intensity level when exercising independently       Able to understand and use Dyspnea scale  Yes       Intervention  Provide education and explanation on how to use Dyspnea  scale       Expected Outcomes  Short Term: Able to use Dyspnea scale daily in rehab to express subjective sense of shortness of breath during exertion;Long Term: Able to use Dyspnea scale to guide intensity level when exercising independently       Knowledge and understanding of Target Heart Rate Range (THRR)  Yes       Intervention  Provide education and explanation of THRR including how the numbers were predicted and where they are located for reference       Expected Outcomes  Short Term: Able to state/look up THRR;Long Term: Able to use THRR to govern intensity when exercising independently;Short Term: Able to use daily as guideline for intensity in rehab       Able to check pulse independently  Yes       Intervention  Provide education and demonstration on how to check pulse in carotid and radial arteries.;Review the importance of being able to check your own pulse for safety during independent exercise       Expected Outcomes  Short Term: Able to explain why pulse checking is important during independent exercise;Long Term: Able to check pulse independently and accurately       Understanding of Exercise Prescription  Yes       Intervention  Provide education, explanation, and written materials on patient's individual exercise prescription       Expected Outcomes  Short Term: Able to explain program exercise prescription;Long Term: Able to explain home exercise prescription to exercise independently          Exercise Goals Re-Evaluation : Exercise Goals Re-Evaluation    Row Name 05/06/18 1043 05/15/18 1010 05/20/18 1013  Exercise Goal Re-Evaluation   Exercise Goals Review  Able to understand and use rate of perceived exertion (RPE) scale  Able to understand and use rate of perceived exertion (RPE) scale;Understanding of Exercise Prescription;Increase Physical Activity;Knowledge and understanding of Target Heart Rate Range (THRR)  Able to understand and use rate of perceived exertion  (RPE) scale;Understanding of Exercise Prescription;Increase Physical Activity;Knowledge and understanding of Target Heart Rate Range (THRR)     Comments  Patient able to understand and use RPE scale appropriately.  Reviewed home exercise guidelines with patient including THRR, RPE scale, and endpoints for exercise. Pt plans to walk as his mode of home exercise.   Patient is walking at home in addition to exercise at cardiac rehab.     Expected Outcomes  Increase workloads as tolerated to improve cardiorespiratory fitness and strength.  Patient will walk 30 minutes at least 2 days/week in addition to exercise at cardiac rehab to help achieve persoanl health and fitness goals.  Patient will walk at least 30 minutes 2 days/week as his mode of home exercise.        Discharge Exercise Prescription (Final Exercise Prescription Changes): Exercise Prescription Changes - 05/20/18 0947      Response to Exercise   Blood Pressure (Admit)  122/80    Blood Pressure (Exercise)  140/78    Blood Pressure (Exit)  138/80    Heart Rate (Admit)  74 bpm    Heart Rate (Exercise)  96 bpm    Heart Rate (Exit)  74 bpm    Rating of Perceived Exertion (Exercise)  12    Symptoms  none    Duration  Progress to 30 minutes of  aerobic without signs/symptoms of physical distress    Intensity  THRR unchanged      Progression   Progression  Continue to progress workloads to maintain intensity without signs/symptoms of physical distress.    Average METs  3.3      Resistance Training   Training Prescription  No   Relaxation, no weights today.     Interval Training   Interval Training  No      Recumbant Bike   Level  5    Minutes  10    METs  3.9      NuStep   Level  4    SPM  75    Minutes  10    METs  2.5      Track   Laps  14    Minutes  10    METs  3.44      Home Exercise Plan   Plans to continue exercise at  Home (comment)    Frequency  Add 2 additional days to program exercise sessions.    Initial  Home Exercises Provided  05/15/18       Nutrition:  Target Goals: Understanding of nutrition guidelines, daily intake of sodium 1500mg , cholesterol 200mg , calories 30% from fat and 7% or less from saturated fats, daily to have 5 or more servings of fruits and vegetables.  Biometrics: Pre Biometrics - 04/30/18 1132      Pre Biometrics   Height  6' 2.5" (1.892 m)    Weight  81.3 kg    Waist Circumference  35 inches    Hip Circumference  37 inches    Waist to Hip Ratio  0.95 %    BMI (Calculated)  22.71    Triceps Skinfold  14 mm    % Body Fat  22.5 %  Grip Strength  39 kg    Flexibility  35 in    Single Leg Stand  21.19 seconds        Nutrition Therapy Plan and Nutrition Goals: Nutrition Therapy & Goals - 04/30/18 0855      Nutrition Therapy   Diet  carb modified heart healthy      Personal Nutrition Goals   Nutrition Goal  Pt to identify and limit food sources of saturated fat, trans fat, refined carbohydrates and sodium    Personal Goal #2  Pt able to name foods that affect blood glucose.    Personal Goal #3  Improved blood glucose control as evidenced by pt's A1c trending from 7.3 toward less than 7.0.      Intervention Plan   Intervention  Prescribe, educate and counsel regarding individualized specific dietary modifications aiming towards targeted core components such as weight, hypertension, lipid management, diabetes, heart failure and other comorbidities.    Expected Outcomes  Short Term Goal: Understand basic principles of dietary content, such as calories, fat, sodium, cholesterol and nutrients.       Nutrition Assessments: Nutrition Assessments - 04/30/18 0859      MEDFICTS Scores   Pre Score  32       Nutrition Goals Re-Evaluation:   Nutrition Goals Re-Evaluation:   Nutrition Goals Discharge (Final Nutrition Goals Re-Evaluation):   Psychosocial: Target Goals: Acknowledge presence or absence of significant depression and/or stress, maximize  coping skills, provide positive support system. Participant is able to verbalize types and ability to use techniques and skills needed for reducing stress and depression.  Initial Review & Psychosocial Screening: Initial Psych Review & Screening - 04/30/18 1340      Initial Review   Current issues with  None Identified      Family Dynamics   Good Support System?  Yes   Khaden lists his wife as a source of support.      Barriers   Psychosocial barriers to participate in program  There are no identifiable barriers or psychosocial needs.      Screening Interventions   Interventions  Encouraged to exercise       Quality of Life Scores: Quality of Life - 05/15/18 1600      Quality of Life   Select  Quality of Life      Quality of Life Scores   Health/Function Pre  25.23 %    Socioeconomic Pre  27.93 %    Psych/Spiritual Pre  29.29 %    Family Pre  27 %    GLOBAL Pre  26.88 %      Scores of 19 and below usually indicate a poorer quality of life in these areas.  A difference of  2-3 points is a clinically meaningful difference.  A difference of 2-3 points in the total score of the Quality of Life Index has been associated with significant improvement in overall quality of life, self-image, physical symptoms, and general health in studies assessing change in quality of life.  PHQ-9: Recent Review Flowsheet Data    Depression screen [facility] 2/9 05/06/2018 03/12/2018 02/05/2017 05/07/2013   Decreased Interest 0 0 1 0   Down, Depressed, Hopeless 0 0 0 0   PHQ - 2 Score 0 0 1 0   Altered sleeping - 0 1 -   Tired, decreased energy - 0 0 -   Change in appetite - 1 0 -   Feeling bad or failure about yourself  - 0 0 -  Trouble concentrating - 0 0 -   Moving slowly or fidgety/restless - 0 0 -   Suicidal thoughts - 0 0 -   PHQ-9 Score - 1 2 -     Interpretation of Total Score  Total Score Depression Severity:  1-4 = Minimal depression, 5-9 = Mild depression, 10-14 = Moderate depression,  15-19 = Moderately severe depression, 20-27 = Severe depression   Psychosocial Evaluation and Intervention:   Psychosocial Re-Evaluation: Psychosocial Re-Evaluation    [place] Name 05/28/18 1121             Psychosocial Re-Evaluation   Current issues with  None Identified       Interventions  Encouraged to attend Cardiac Rehabilitation for the exercise       Continue Psychosocial Services   No Follow up required          Psychosocial Discharge (Final Psychosocial Re-Evaluation): Psychosocial Re-Evaluation - 05/28/18 1121      Psychosocial Re-Evaluation   Current issues with  None Identified    Interventions  Encouraged to attend Cardiac Rehabilitation for the exercise    Continue Psychosocial Services   No Follow up required       Vocational Rehabilitation: Provide vocational rehab assistance to qualifying candidates.   Vocational Rehab Evaluation & Intervention:   Education: Education Goals: Education classes will be provided on a weekly basis, covering required topics. Participant will state understanding/return demonstration of topics presented.  Learning Barriers/Preferences: Learning Barriers/Preferences - 04/30/18 0817      Learning Barriers/Preferences   Learning Barriers  Sight    Learning Preferences  Written Material;Skilled Demonstration;Verbal Instruction       Education Topics: Count Your Pulse:  -Group instruction provided by verbal instruction, demonstration, patient participation and written materials to support subject.  Instructors address importance of being able to find your pulse and how to count your pulse when at home without a heart monitor.  Patients get hands on experience counting their pulse with staff help and individually.   CARDIAC REHAB PHASE II EXERCISE from 05/08/2018 in [place]  Date  05/08/18  Instruction Review Code  2- Demonstrated Understanding      Heart Attack, Angina, and Risk Factor  Modification:  -Group instruction provided by verbal instruction, video, and written materials to support subject.  Instructors address signs and symptoms of angina and heart attacks.    Also discuss risk factors for heart disease and how to make changes to improve heart health risk factors.   CARDIAC REHAB PHASE II EXERCISE from 05/27/2018 in [place]  Date  05/27/18  Instruction Review Code  2- Demonstrated Understanding      Functional Fitness:  -Group instruction provided by verbal instruction, demonstration, patient participation, and written materials to support subject.  Instructors address safety measures for doing things around the house.  Discuss how to get up and down off the floor, how to pick things up properly, how to safely get out of a chair without assistance, and balance training.   Meditation and Mindfulness:  -Group instruction provided by verbal instruction, patient participation, and written materials to support subject.  Instructor addresses importance of mindfulness and meditation practice to help reduce stress and improve awareness.  Instructor also leads participants through a meditation exercise.    Stretching for Flexibility and Mobility:  -Group instruction provided by verbal instruction, patient participation, and written materials to support subject.  Instructors lead participants through series of stretches that are designed  to increase flexibility thus improving mobility.  These stretches are additional exercise for major muscle groups that are typically performed during regular warm up and cool down.   Hands Only CPR:  -Group verbal, video, and participation provides a basic overview of AHA guidelines for community CPR. Role-play of emergencies allow participants the opportunity to practice calling for help and chest compression technique with discussion of AED use.   Hypertension: -Group verbal and written instruction that  provides a basic overview of hypertension including the most recent diagnostic guidelines, risk factor reduction with self-care instructions and medication management.    Nutrition I class: Heart Healthy Eating:  -Group instruction provided by PowerPoint slides, verbal discussion, and written materials to support subject matter. The instructor gives an explanation and review of the Therapeutic Lifestyle Changes diet recommendations, which includes a discussion on lipid goals, dietary fat, sodium, fiber, plant stanol/sterol esters, sugar, and the components of a well-balanced, healthy diet.   Nutrition II class: Lifestyle Skills:  -Group instruction provided by PowerPoint slides, verbal discussion, and written materials to support subject matter. The instructor gives an explanation and review of label reading, grocery shopping for heart health, heart healthy recipe modifications, and ways to make healthier choices when eating out.   Diabetes Question & Answer:  -Group instruction provided by PowerPoint slides, verbal discussion, and written materials to support subject matter. The instructor gives an explanation and review of diabetes co-morbidities, pre- and post-prandial blood glucose goals, pre-exercise blood glucose goals, signs, symptoms, and treatment of hypoglycemia and hyperglycemia, and foot care basics.   Diabetes Blitz:  -Group instruction provided by PowerPoint slides, verbal discussion, and written materials to support subject matter. The instructor gives an explanation and review of the physiology behind type 1 and type 2 diabetes, diabetes medications and rational behind using different medications, pre- and post-prandial blood glucose recommendations and Hemoglobin A1c goals, diabetes diet, and exercise including blood glucose guidelines for exercising safely.    Portion Distortion:  -Group instruction provided by PowerPoint slides, verbal discussion, written materials, and food  models to support subject matter. The instructor gives an explanation of serving size versus portion size, changes in portions sizes over the last 20 years, and what consists of a serving from each food group.   Stress Management:  -Group instruction provided by verbal instruction, video, and written materials to support subject matter.  Instructors review role of stress in heart disease and how to cope with stress positively.     Exercising on Your Own:  -Group instruction provided by verbal instruction, power point, and written materials to support subject.  Instructors discuss benefits of exercise, components of exercise, frequency and intensity of exercise, and end points for exercise.  Also discuss use of nitroglycerin and activating EMS.  Review options of places to exercise outside of rehab.  Review guidelines for sex with heart disease.   Cardiac Drugs I:  -Group instruction provided by verbal instruction and written materials to support subject.  Instructor reviews cardiac drug classes: antiplatelets, anticoagulants, beta blockers, and statins.  Instructor discusses reasons, side effects, and lifestyle considerations for each drug class.   CARDIAC REHAB PHASE II EXERCISE from 05/08/2018 in [place]  Date  05/06/18  Instruction Review Code  2- Demonstrated Understanding      Cardiac Drugs II:  -Group instruction provided by verbal instruction and written materials to support subject.  Instructor reviews cardiac drug classes: angiotensin converting enzyme inhibitors (ACE-I), angiotensin II receptor blockers (ARBs), nitrates,  and calcium channel blockers.  Instructor discusses reasons, side effects, and lifestyle considerations for each drug class.   Anatomy and Physiology of the Circulatory System:  Group verbal and written instruction and models provide basic cardiac anatomy and physiology, with the coronary electrical and arterial systems. Review of:  AMI, Angina, Valve disease, Heart Failure, Peripheral Artery Disease, Cardiac Arrhythmia, Pacemakers, and the ICD.   CARDIAC REHAB PHASE II EXERCISE from 05/27/2018 in [facility]  Date  05/20/18  Instruction Review Code  2- Demonstrated Understanding      Other Education:  -Group or individual verbal, written, or video instructions that support the educational goals of the cardiac rehab program.   Holiday Eating Survival Tips:  -Group instruction provided by PowerPoint slides, verbal discussion, and written materials to support subject matter. The instructor gives patients tips, tricks, and techniques to help them not only survive but enjoy the holidays despite the onslaught of food that accompanies the holidays.   Knowledge Questionnaire Score: Knowledge Questionnaire Score - 05/15/18 1010      Knowledge Questionnaire Score   Pre Score  15/24   Did not finish quiz.      Core Components/Risk Factors/Patient Goals at Admission: Personal Goals and Risk Factors at Admission - 04/30/18 1147      Core Components/Risk Factors/Patient Goals on Admission    Weight Management  Yes;Weight Maintenance    Intervention  Weight Management: Develop a combined nutrition and exercise program designed to reach desired caloric intake, while maintaining appropriate intake of nutrient and fiber, sodium and fats, and appropriate energy expenditure required for the weight goal.;Weight Management: Provide education and appropriate resources to help participant work on and attain dietary goals.;Weight Management/Obesity: Establish reasonable short term and long term weight goals.    Admit Weight  179 lb 3.7 oz (81.3 kg)    Expected Outcomes  Long Term: Adherence to nutrition and physical activity/exercise program aimed toward attainment of established weight goal;Short Term: Continue to assess and modify interventions until short term weight is achieved;Understanding  recommendations for meals to include 15-35% energy as protein, 25-35% energy from fat, 35-60% energy from carbohydrates, less than 200mg  of dietary cholesterol, 20-35 gm of total fiber daily;Understanding of distribution of calorie intake throughout the day with the consumption of 4-5 meals/snacks;Weight Maintenance: Understanding of the daily nutrition guidelines, which includes 25-35% calories from fat, 7% or less cal from saturated fats, less than 200mg  cholesterol, less than 1.5gm of sodium, & 5 or more servings of fruits and vegetables daily    Improve shortness of breath with ADL's  Yes    Intervention  Provide education, individualized exercise plan and daily activity instruction to help decrease symptoms of SOB with activities of daily living.    Expected Outcomes  Short Term: Improve cardiorespiratory fitness to achieve a reduction of symptoms when performing ADLs;Long Term: Be able to perform more ADLs without symptoms or delay the onset of symptoms    Diabetes  Yes    Intervention  Provide education about signs/symptoms and action to take for hypo/hyperglycemia.;Provide education about proper nutrition, including hydration, and aerobic/resistive exercise prescription along with prescribed medications to achieve blood glucose in normal ranges: Fasting glucose 65-99 mg/dL    Expected Outcomes  Long Term: Attainment of HbA1C < 7%.;Short Term: Participant verbalizes understanding of the signs/symptoms and immediate care of hyper/hypoglycemia, proper foot care and importance of medication, aerobic/resistive exercise and nutrition plan for blood glucose control.    Hypertension  Yes  Intervention  Monitor prescription use compliance.;Provide education on lifestyle modifcations including regular physical activity/exercise, weight management, moderate sodium restriction and increased consumption of fresh fruit, vegetables, and low fat dairy, alcohol moderation, and smoking cessation.    Expected  Outcomes  Short Term: Continued assessment and intervention until BP is < 140/51mm HG in hypertensive participants. < 130/47mm HG in hypertensive participants with diabetes, heart failure or chronic kidney disease.;Long Term: Maintenance of blood pressure at goal levels.    Lipids  Yes    Intervention  Provide education and support for participant on nutrition & aerobic/resistive exercise along with prescribed medications to achieve LDL 70mg , HDL >40mg .    Expected Outcomes  Short Term: Participant states understanding of desired cholesterol values and is compliant with medications prescribed. Participant is following exercise prescription and nutrition guidelines.;Long Term: Cholesterol controlled with medications as prescribed, with individualized exercise RX and with personalized nutrition plan. Value goals: LDL < 70mg , HDL > 40 mg.       Core Components/Risk Factors/Patient Goals Review:  Goals and Risk Factor Review    Row Name 05/28/18 1112             Core Components/Risk Factors/Patient Goals Review   Personal Goals Review  Weight Management/Obesity;Hypertension;Diabetes;Lipids       Review  Eeshan's vital signs  and CBG'shave been stable at cardiac rehab. Steed's has lost about 2 pounds since starting exercise. Will continue to monitor weight.       Expected Outcomes  Patient will continue to participate in phase 2 cardiac rehab for exercise, nutrition and lifestyle modification opportunites.          Core Components/Risk Factors/Patient Goals at Discharge (Final Review):  Goals and Risk Factor Review - 05/28/18 1112      Core Components/Risk Factors/Patient Goals Review   Personal Goals Review  Weight Management/Obesity;Hypertension;Diabetes;Lipids    Review  Cougar's vital signs  and CBG'shave been stable at cardiac rehab. Rayfield's has lost about 2 pounds since starting exercise. Will continue to monitor weight.    Expected Outcomes  Patient will continue to participate in  phase 2 cardiac rehab for exercise, nutrition and lifestyle modification opportunites.       ITP Comments: ITP Comments    Row Name 04/30/18 0756 05/28/18 1107         ITP Comments  Dr. Fransico Him. Medical Director   30 Day ITP Review. Patient with good attendance and participation in phase 2 cardiac rehab.         Comments: See ITP comments.[name], RN,BSN 05/28/2018 11:22 AM

## 2018-05-29 ENCOUNTER — Encounter (HOSPITAL_COMMUNITY): Payer: Medicare Other

## 2018-05-29 ENCOUNTER — Encounter (HOSPITAL_COMMUNITY)
Admission: RE | Admit: 2018-05-29 | Discharge: 2018-05-29 | Disposition: A | Discharge status: Home / Self Care | Payer: Medicare Other | Source: Ambulatory Visit | Attending: Cardiology | Admitting: Cardiology

## 2018-05-29 DIAGNOSIS — Z87891 Personal history of nicotine dependence: Secondary | ICD-10-CM | POA: Diagnosis not present

## 2018-05-29 DIAGNOSIS — Z951 Presence of aortocoronary bypass graft: Secondary | ICD-10-CM

## 2018-05-29 DIAGNOSIS — Z952 Presence of prosthetic heart valve: Secondary | ICD-10-CM

## 2018-05-29 DIAGNOSIS — I10 Essential (primary) hypertension: Secondary | ICD-10-CM | POA: Diagnosis not present

## 2018-05-29 DIAGNOSIS — I2510 Atherosclerotic heart disease of native coronary artery without angina pectoris: Secondary | ICD-10-CM | POA: Diagnosis not present

## 2018-05-29 DIAGNOSIS — Z7982 Long term (current) use of aspirin: Secondary | ICD-10-CM | POA: Diagnosis not present

## 2018-05-31 DIAGNOSIS — L255 Unspecified contact dermatitis due to plants, except food: Secondary | ICD-10-CM | POA: Diagnosis not present

## 2018-06-01 ENCOUNTER — Encounter (HOSPITAL_COMMUNITY): Payer: Medicare Other

## 2018-06-01 ENCOUNTER — Encounter (HOSPITAL_COMMUNITY)
Admission: RE | Admit: 2018-06-01 | Discharge: 2018-06-01 | Disposition: A | Discharge status: Home / Self Care | Payer: Medicare Other | Source: Ambulatory Visit | Attending: Cardiology | Admitting: Cardiology

## 2018-06-01 DIAGNOSIS — Z951 Presence of aortocoronary bypass graft: Secondary | ICD-10-CM

## 2018-06-01 DIAGNOSIS — Z952 Presence of prosthetic heart valve: Secondary | ICD-10-CM

## 2018-06-01 NOTE — Progress Notes (Signed)
Incomplete Session Note  Patient Details  Name: [name] MRN: [id number] Date of Birth: [birth year] Referring Provider:     CARDIAC REHAB PHASE II ORIENTATION from 04/30/2018 in [facility]  Referring Provider  [name]       [name] did not complete his rehab session.  Creek's initial blood pressure was noted at 152/88. Valeriano said he received a shot yesterday for poison IVY. Jermani had some scattered raised areas that were covered with a Band-Aid on his left arm and left elbow. I advised Rafi that he not exercise today. Patient plans to return when the affected areas are not weeping. Patient states understanding.[name], RN,BSN 06/01/2018 4:41 PM

## 2018-06-03 ENCOUNTER — Encounter (HOSPITAL_COMMUNITY): Payer: Medicare Other

## 2018-06-03 ENCOUNTER — Telehealth (HOSPITAL_COMMUNITY): Payer: Self-pay | Admitting: Family

## 2018-06-05 ENCOUNTER — Encounter (HOSPITAL_COMMUNITY)
Admission: RE | Admit: 2018-06-05 | Discharge: 2018-06-05 | Disposition: A | Discharge status: Home / Self Care | Payer: Medicare Other | Source: Ambulatory Visit | Attending: Cardiology | Admitting: Cardiology

## 2018-06-05 ENCOUNTER — Encounter (HOSPITAL_COMMUNITY): Payer: Medicare Other

## 2018-06-05 DIAGNOSIS — I2510 Atherosclerotic heart disease of native coronary artery without angina pectoris: Secondary | ICD-10-CM | POA: Diagnosis not present

## 2018-06-05 DIAGNOSIS — Z87891 Personal history of nicotine dependence: Secondary | ICD-10-CM | POA: Diagnosis not present

## 2018-06-05 DIAGNOSIS — Z951 Presence of aortocoronary bypass graft: Secondary | ICD-10-CM

## 2018-06-05 DIAGNOSIS — Z952 Presence of prosthetic heart valve: Secondary | ICD-10-CM | POA: Diagnosis not present

## 2018-06-05 DIAGNOSIS — Z7982 Long term (current) use of aspirin: Secondary | ICD-10-CM | POA: Diagnosis not present

## 2018-06-05 DIAGNOSIS — I10 Essential (primary) hypertension: Secondary | ICD-10-CM | POA: Diagnosis not present

## 2018-06-08 ENCOUNTER — Encounter (HOSPITAL_COMMUNITY): Payer: Medicare Other

## 2018-06-08 ENCOUNTER — Encounter (HOSPITAL_COMMUNITY)
Admission: RE | Admit: 2018-06-08 | Discharge: 2018-06-08 | Disposition: A | Discharge status: Home / Self Care | Payer: Medicare Other | Source: Ambulatory Visit | Attending: Cardiology | Admitting: Cardiology

## 2018-06-08 DIAGNOSIS — Z951 Presence of aortocoronary bypass graft: Secondary | ICD-10-CM

## 2018-06-08 DIAGNOSIS — I10 Essential (primary) hypertension: Secondary | ICD-10-CM | POA: Diagnosis not present

## 2018-06-08 DIAGNOSIS — Z7982 Long term (current) use of aspirin: Secondary | ICD-10-CM | POA: Diagnosis not present

## 2018-06-08 DIAGNOSIS — Z952 Presence of prosthetic heart valve: Secondary | ICD-10-CM | POA: Diagnosis not present

## 2018-06-08 DIAGNOSIS — Z87891 Personal history of nicotine dependence: Secondary | ICD-10-CM | POA: Diagnosis not present

## 2018-06-08 DIAGNOSIS — I2510 Atherosclerotic heart disease of native coronary artery without angina pectoris: Secondary | ICD-10-CM | POA: Diagnosis not present

## 2018-06-10 ENCOUNTER — Ambulatory Visit (INDEPENDENT_AMBULATORY_CARE_PROVIDER_SITE_OTHER): Payer: Medicare Other | Admitting: Family

## 2018-06-10 ENCOUNTER — Encounter (HOSPITAL_COMMUNITY): Payer: Medicare Other

## 2018-06-10 ENCOUNTER — Encounter: Payer: Self-pay | Admitting: Family

## 2018-06-10 VITALS — BP 145/90 | HR 66 | Temp 98.2°F | Resp 16 | Ht 76.0 in | Wt 176.0 lb

## 2018-06-10 DIAGNOSIS — Z23 Encounter for immunization: Secondary | ICD-10-CM

## 2018-06-10 DIAGNOSIS — R634 Abnormal weight loss: Secondary | ICD-10-CM

## 2018-06-10 DIAGNOSIS — E1165 Type 2 diabetes mellitus with hyperglycemia: Secondary | ICD-10-CM | POA: Diagnosis not present

## 2018-06-10 DIAGNOSIS — I10 Essential (primary) hypertension: Secondary | ICD-10-CM | POA: Diagnosis not present

## 2018-06-10 DIAGNOSIS — I2510 Atherosclerotic heart disease of native coronary artery without angina pectoris: Secondary | ICD-10-CM | POA: Diagnosis not present

## 2018-06-10 DIAGNOSIS — E785 Hyperlipidemia, unspecified: Secondary | ICD-10-CM | POA: Diagnosis not present

## 2018-06-10 LAB — COMPREHENSIVE METABOLIC PANEL
ALT: 7 U/L (ref 0–53)
AST: 11 U/L (ref 0–37)
Albumin: 4.4 g/dL (ref 3.5–5.2)
Alkaline Phosphatase: 56 U/L (ref 39–117)
BILIRUBIN TOTAL: 0.4 mg/dL (ref 0.2–1.2)
BUN: 13 mg/dL (ref 6–23)
CALCIUM: 9.8 mg/dL (ref 8.4–10.5)
CHLORIDE: 103 meq/L (ref 96–112)
CO2: 28 meq/L (ref 19–32)
CREATININE: 0.93 mg/dL (ref 0.40–1.50)
GFR: 104.29 mL/min (ref >60.00)
Glucose, Bld: 87 mg/dL (ref 70–99)
Potassium: 5.2 mEq/L — ABNORMAL HIGH (ref 3.5–5.1)
SODIUM: 140 meq/L (ref 135–145)
Total Protein: 7.4 g/dL (ref 6.0–8.3)

## 2018-06-10 LAB — LIPID PANEL
CHOL/HDL RATIO: 3
Cholesterol: 133 mg/dL (ref 0–200)
HDL: 47.8 mg/dL (ref >39.00)
LDL CALC: 72 mg/dL (ref 0–99)
NONHDL: 84.9
Triglycerides: 63 mg/dL (ref 0.0–149.0)
VLDL: 12.6 mg/dL (ref 0.0–40.0)

## 2018-06-10 LAB — TSH: TSH: 1.55 u[IU]/mL (ref 0.35–4.50)

## 2018-06-10 LAB — HEMOGLOBIN A1C: Hgb A1c MFr Bld: 7.3 % — ABNORMAL HIGH (ref 4.6–6.5)

## 2018-06-10 MED ORDER — INSULIN DETEMIR 100 UNIT/ML FLEXPEN: 40.0000 [IU] | Freq: Every day | SUBCUTANEOUS | 11 refills | Status: DC

## 2018-06-10 MED ORDER — DAPAGLIFLOZIN PROPANEDIOL 5 MG PO TABS: 5.0000 mg | ORAL_TABLET | Freq: Every day | ORAL | 5 refills | Status: DC

## 2018-06-10 MED ORDER — LISINOPRIL 20 MG PO TABS: 20.0000 mg | ORAL_TABLET | Freq: Every day | ORAL | 5 refills | Status: DC

## 2018-06-10 NOTE — Patient Instructions (Signed)
Please complete lab work prior to leaving.   

## 2018-06-10 NOTE — Progress Notes (Signed)
Subjective:    Patient ID: [name], male    DOB: 1950/11/27, 67 y.o.   MRN: [id number]  HPI   Patient is a 67 yr old male who presents today for follow up.  1) DM2- reports that his sugars have been well controlled 98-114.  Denies any hypoglycemia.   Lab Results  Component Value Date   HGBA1C 7.3 (H) 03/02/2018   HGBA1C 7.9 (H) 12/15/2017   HGBA1C 8.2 (H) 08/20/2017   Lab Results  Component Value Date   MICROALBUR 0.9 08/20/2017   LDLCALC 83 05/20/2017   CREATININE 0.88 05/07/2018   2) HTN-he is maintained on lisinopril and metoprolol. BP Readings from Last 3 Encounters:  06/10/18 (!) 148/93  04/30/18 132/78  04/24/18 (!) 154/90   3) Weight loss- was 173 at the time of his surgery.  Postoperatively he has been very busy with cardiac rehab and has been quite active.  Wt Readings from Last 3 Encounters:  06/10/18 176 lb (79.8 kg)  04/30/18 179 lb 3.7 oz (81.3 kg)  04/24/18 177 lb 12.8 oz (80.6 kg)   4) CAD/AS (s/p AVR) -  Continues cardiac rehab.   Review of Systems See HPI  Past Medical History:  Diagnosis Date  . Abnormal myocardial perfusion study 2006   EF 44% ? inferoseptal ischemia. no cath done  . Breast mass, right 03/2008  . Contact dermatitis 01/31/2013  . COPD (chronic obstructive pulmonary disease) ([place])    "dx'd 12/2017"  . Coronary artery disease   . Heart murmur   . History of colonic polyps   . Hyperlipidemia   . Hypertension   . Low back pain   . Type II diabetes mellitus ([place])      Social History   Socioeconomic History  . Marital status: Married    Spouse name: Not on file  . Number of children: Not on file  . Years of education: Not on file  . Highest education level: Not on file  Occupational History  . Not on file  Social Needs  . Financial resource strain: Not on file  . Food insecurity:    Worry: Not on file    Inability: Not on file  . Transportation needs:    Medical: Not on file    Non-medical: Not on file    Tobacco Use  . Smoking status: Former Smoker    Packs/day: 1.00    Years: 33.00    Pack years: 33.00    Types: Cigarettes  . Smokeless tobacco: Never Used  . Tobacco comment: "quit in ~ 2007"  Substance and Sexual Activity  . Alcohol use: Yes    Alcohol/week: 3.0 standard drinks    Types: 3 Glasses of wine per week  . Drug use: Not Currently  . Sexual activity: Not Currently  Lifestyle  . Physical activity:    Days per week: Not on file    Minutes per session: Not on file  . Stress: Not on file  Relationships  . Social connections:    Talks on phone: Not on file    Gets together: Not on file    Attends religious service: Not on file    Active member of club or organization: Not on file    Attends meetings of clubs or organizations: Not on file    Relationship status: Not on file  . Intimate partner violence:    Fear of current or ex partner: Not on file    Emotionally abused: Not on file  Physically abused: Not on file    Forced sexual activity: Not on file  Other Topics Concern  . 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 ABDOMINAL AORTOGRAM N/A 01/13/2018   Procedure: ABDOMINAL AORTOGRAM;  Surgeon: Troy Sine, MD;  Location: [facility];  Service: Cardiovascular;  Laterality: N/A;  . AORTIC VALVE REPLACEMENT N/A 03/04/2018   Procedure: AORTIC VALVE REPLACEMENT (AVR) 36mm Edwards Magna Ease Tissue Valve.;  Surgeon: Melrose Nakayama, MD;  Location: [facility];  Service: Open Heart Surgery;  Laterality: N/A;  . CARDIAC VALVE REPLACEMENT    . COLONOSCOPY W/ POLYPECTOMY    . CORONARY ARTERY BYPASS GRAFT N/A 03/04/2018   Procedure: CORONARY ARTERY BYPASS GRAFTING (CABG) x1:  LIMA to LAD.;  Surgeon: Melrose Nakayama, MD;  Location: [facility];  Service: Open Heart Surgery;   Laterality: N/A;  . LIPOMA EXCISION Left 07/2008    "back of shoulder" Dr Gershon Crane  . RIGHT/LEFT HEART CATH AND CORONARY ANGIOGRAPHY N/A 01/13/2018   Procedure: RIGHT/LEFT HEART CATH AND CORONARY ANGIOGRAPHY;  Surgeon: Troy Sine, MD;  Location: [facility];  Service: Cardiovascular;  Laterality: N/A;  . TEE WITHOUT CARDIOVERSION N/A 03/04/2018   Procedure: TRANSESOPHAGEAL ECHOCARDIOGRAM (TEE);  Surgeon: Melrose Nakayama, MD;  Location: [facility];  Service: Open Heart Surgery;  Laterality: N/A;  . TONSILLECTOMY    . WISDOM TOOTH EXTRACTION      Family History  Problem Relation Age of Onset  . Melanoma Father 101       deceased secondary to melanoma  . Alzheimer's disease Father   . Diabetes Father   . Hypertension Mother        alive -29  . Colon cancer Neg Hx     Allergies  Allergen Reactions  . Amlodipine Besylate Other (See Comments)    REACTION: ? caused left axillary nodules/chest flutter  . Hydrochlorothiazide Hives    Current Outpatient Medications on File Prior to Visit  Medication Sig Dispense Refill  . aspirin EC 325 MG EC tablet Take 1 tablet (325 mg total) by mouth daily.  0  . Cyanocobalamin (VITAMIN B-12 PO) Take 1 tablet by mouth daily.    . dapagliflozin propanediol (FARXIGA) 5 MG TABS tablet Take 5 mg by mouth daily. 30 tablet 5  . furosemide (LASIX) 20 MG tablet TAKE 1 TABLET BY MOUTH EVERY DAY 90 tablet 2  . glucose blood (PRODIGY NO CODING BLOOD GLUC) test strip Use as instructed to check blood sugar twice a day.  DX  E11.40 100 each 1  . insulin aspart (NOVOLOG FLEXPEN) 100 UNIT/ML FlexPen INJECT 11-15 SUBCUTANEOUSLY DAILY BEFORE SUPPER (Patient taking differently: Inject 11 Units into the skin daily before supper. ) 15 mL 3  . insulin detemir (LEVEMIR) 100 UNIT/ML injection 40 units once daily 10 mL 5  . Insulin Pen Needle (B-D ULTRAFINE III SHORT PEN) 31G X 8 MM MISC USE AS DIRECTED 100 each 5  . lisinopril (PRINIVIL,ZESTRIL) 20 MG tablet Take 1  tablet (20 mg total) by mouth daily. 30 tablet 1  . metFORMIN (GLUCOPHAGE-XR) 500 MG 24 hr tablet Take 2 tablets (1,000 mg total) by mouth 2 (two) times daily. 360 tablet 1  .  metoprolol tartrate (LOPRESSOR) 50 MG tablet Take 1 tablet (50 mg total) by mouth 2 (two) times daily. 60 tablet 2  . rosuvastatin (CRESTOR) 20 MG tablet TAKE 1 TABLET BY MOUTH EVERY DAY 90 tablet 1   No current facility-administered medications on file prior to visit.     BP (!) 145/90 (BP Location: Left Arm, Cuff Size: Small)   Pulse 66   Temp 98.2 F (36.8 C) (Oral)   Resp 16   Ht 6\' 4"  (1.93 m)   Wt 176 lb (79.8 kg)   SpO2 97%   BMI 21.42 kg/m       Objective:   Physical Exam  Constitutional: He is oriented to person, place, and time. He appears well-developed and well-nourished. No distress.  HENT:  Head: Normocephalic and atraumatic.  Cardiovascular: Normal rate and regular rhythm.  No murmur heard. Pulmonary/Chest: Effort normal and breath sounds normal. No respiratory distress. He has no wheezes. He has no rales.  Musculoskeletal: He exhibits no edema.  Neurological: He is alert and oriented to person, place, and time.  Skin: Skin is warm and dry.  Psychiatric: He has a normal mood and affect. His behavior is normal. Thought content normal.          Assessment & Plan:  Hyperkalemia- lab work reveals hyperkalemia.  His last few potassiums have also been on the upper limit of normal.  I would like to stop his lisinopril and plan to repeat his potassium in 1 week.  Please see phone note.  Hypertension- DC lisinopril secondary to hyperkalemia.  We will add hydralazine 25 mg 3 times daily and plan to continue his metoprolol.  He is intolerant to amlodipine and his heart rate is on the low side so I am hesitant to increase his beta-blocker.  He is already on a diuretic.  Weight loss- he is up 3 pounds since his surgery and has been more active. I advised him to continue to monitor his weight and  let me know if you has any further weight loss.   DM2-  Lab Results  Component Value Date   HGBA1C 7.3 (H) 06/10/2018   Follow up A1C is stable.  Reasonable for his age.  Advised pt to continue to work on diet/exercise and to continue current meds.

## 2018-06-11 ENCOUNTER — Telehealth: Payer: Self-pay | Admitting: Family

## 2018-06-11 MED ORDER — HYDRALAZINE HCL 25 MG PO TABS: 25.0000 mg | ORAL_TABLET | Freq: Three times a day (TID) | ORAL | 3 refills | Status: DC

## 2018-06-11 NOTE — Telephone Encounter (Signed)
Potassium is high. Could be due to side effect of lisinopril. Please d/c lisinopril and instead  Hydralazine 25mg  tid. Follow up in 2 weeks with me.   BP Readings from Last 3 Encounters:  06/10/18 (!) 145/90  04/30/18 132/78  04/24/18 (!) 154/90

## 2018-06-12 ENCOUNTER — Encounter (HOSPITAL_COMMUNITY): Payer: Medicare Other

## 2018-06-12 ENCOUNTER — Encounter (HOSPITAL_COMMUNITY)
Admission: RE | Admit: 2018-06-12 | Discharge: 2018-06-12 | Disposition: A | Discharge status: Home / Self Care | Payer: Medicare Other | Source: Ambulatory Visit | Attending: Cardiology | Admitting: Cardiology

## 2018-06-12 DIAGNOSIS — Z87891 Personal history of nicotine dependence: Secondary | ICD-10-CM | POA: Diagnosis not present

## 2018-06-12 DIAGNOSIS — Z952 Presence of prosthetic heart valve: Secondary | ICD-10-CM | POA: Diagnosis not present

## 2018-06-12 DIAGNOSIS — Z7982 Long term (current) use of aspirin: Secondary | ICD-10-CM | POA: Diagnosis not present

## 2018-06-12 DIAGNOSIS — I2510 Atherosclerotic heart disease of native coronary artery without angina pectoris: Secondary | ICD-10-CM | POA: Diagnosis not present

## 2018-06-12 DIAGNOSIS — Z951 Presence of aortocoronary bypass graft: Secondary | ICD-10-CM | POA: Diagnosis not present

## 2018-06-12 DIAGNOSIS — I10 Essential (primary) hypertension: Secondary | ICD-10-CM | POA: Diagnosis not present

## 2018-06-15 ENCOUNTER — Encounter (HOSPITAL_COMMUNITY)
Admission: RE | Admit: 2018-06-15 | Discharge: 2018-06-15 | Disposition: A | Discharge status: Home / Self Care | Payer: Medicare Other | Source: Ambulatory Visit | Attending: Cardiology | Admitting: Cardiology

## 2018-06-15 ENCOUNTER — Encounter (HOSPITAL_COMMUNITY): Payer: Medicare Other

## 2018-06-15 DIAGNOSIS — I2510 Atherosclerotic heart disease of native coronary artery without angina pectoris: Secondary | ICD-10-CM | POA: Diagnosis not present

## 2018-06-15 DIAGNOSIS — Z952 Presence of prosthetic heart valve: Secondary | ICD-10-CM | POA: Diagnosis not present

## 2018-06-15 DIAGNOSIS — Z87891 Personal history of nicotine dependence: Secondary | ICD-10-CM | POA: Diagnosis not present

## 2018-06-15 DIAGNOSIS — Z951 Presence of aortocoronary bypass graft: Secondary | ICD-10-CM | POA: Diagnosis not present

## 2018-06-15 DIAGNOSIS — Z7982 Long term (current) use of aspirin: Secondary | ICD-10-CM | POA: Diagnosis not present

## 2018-06-15 DIAGNOSIS — I10 Essential (primary) hypertension: Secondary | ICD-10-CM | POA: Diagnosis not present

## 2018-06-17 ENCOUNTER — Encounter (HOSPITAL_COMMUNITY): Payer: Medicare Other

## 2018-06-17 ENCOUNTER — Encounter (HOSPITAL_COMMUNITY)
Admission: RE | Admit: 2018-06-17 | Discharge: 2018-06-17 | Disposition: A | Discharge status: Home / Self Care | Payer: Medicare Other | Source: Ambulatory Visit | Attending: Cardiology | Admitting: Cardiology

## 2018-06-17 DIAGNOSIS — I10 Essential (primary) hypertension: Secondary | ICD-10-CM | POA: Diagnosis not present

## 2018-06-17 DIAGNOSIS — I2510 Atherosclerotic heart disease of native coronary artery without angina pectoris: Secondary | ICD-10-CM | POA: Diagnosis not present

## 2018-06-17 DIAGNOSIS — Z87891 Personal history of nicotine dependence: Secondary | ICD-10-CM | POA: Diagnosis not present

## 2018-06-17 DIAGNOSIS — Z7982 Long term (current) use of aspirin: Secondary | ICD-10-CM | POA: Diagnosis not present

## 2018-06-17 DIAGNOSIS — Z952 Presence of prosthetic heart valve: Secondary | ICD-10-CM

## 2018-06-17 DIAGNOSIS — Z951 Presence of aortocoronary bypass graft: Secondary | ICD-10-CM

## 2018-06-19 ENCOUNTER — Encounter (HOSPITAL_COMMUNITY)
Admission: RE | Admit: 2018-06-19 | Discharge: 2018-06-19 | Disposition: A | Discharge status: Home / Self Care | Payer: Medicare Other | Source: Ambulatory Visit | Attending: Cardiology | Admitting: Cardiology

## 2018-06-19 ENCOUNTER — Encounter (HOSPITAL_COMMUNITY): Payer: Medicare Other

## 2018-06-19 DIAGNOSIS — Z952 Presence of prosthetic heart valve: Secondary | ICD-10-CM

## 2018-06-19 DIAGNOSIS — Z7982 Long term (current) use of aspirin: Secondary | ICD-10-CM | POA: Diagnosis not present

## 2018-06-19 DIAGNOSIS — Z87891 Personal history of nicotine dependence: Secondary | ICD-10-CM | POA: Diagnosis not present

## 2018-06-19 DIAGNOSIS — Z951 Presence of aortocoronary bypass graft: Secondary | ICD-10-CM

## 2018-06-19 DIAGNOSIS — I10 Essential (primary) hypertension: Secondary | ICD-10-CM | POA: Diagnosis not present

## 2018-06-19 DIAGNOSIS — I2510 Atherosclerotic heart disease of native coronary artery without angina pectoris: Secondary | ICD-10-CM | POA: Diagnosis not present

## 2018-06-22 ENCOUNTER — Encounter (HOSPITAL_COMMUNITY): Payer: Medicare Other

## 2018-06-22 ENCOUNTER — Encounter (HOSPITAL_COMMUNITY)
Admission: RE | Admit: 2018-06-22 | Discharge: 2018-06-22 | Disposition: A | Discharge status: Home / Self Care | Payer: Medicare Other | Source: Ambulatory Visit | Attending: Cardiology | Admitting: Cardiology

## 2018-06-22 DIAGNOSIS — Z7982 Long term (current) use of aspirin: Secondary | ICD-10-CM | POA: Diagnosis not present

## 2018-06-22 DIAGNOSIS — I2510 Atherosclerotic heart disease of native coronary artery without angina pectoris: Secondary | ICD-10-CM | POA: Diagnosis not present

## 2018-06-22 DIAGNOSIS — I10 Essential (primary) hypertension: Secondary | ICD-10-CM | POA: Diagnosis not present

## 2018-06-22 DIAGNOSIS — Z952 Presence of prosthetic heart valve: Secondary | ICD-10-CM | POA: Diagnosis not present

## 2018-06-22 DIAGNOSIS — Z951 Presence of aortocoronary bypass graft: Secondary | ICD-10-CM | POA: Diagnosis not present

## 2018-06-22 DIAGNOSIS — Z87891 Personal history of nicotine dependence: Secondary | ICD-10-CM | POA: Diagnosis not present

## 2018-06-24 ENCOUNTER — Encounter (HOSPITAL_COMMUNITY): Payer: Medicare Other

## 2018-06-24 ENCOUNTER — Encounter (HOSPITAL_COMMUNITY)
Admission: RE | Admit: 2018-06-24 | Discharge: 2018-06-24 | Disposition: A | Discharge status: Home / Self Care | Payer: Medicare Other | Source: Ambulatory Visit | Attending: Cardiology | Admitting: Cardiology

## 2018-06-24 DIAGNOSIS — Z952 Presence of prosthetic heart valve: Secondary | ICD-10-CM | POA: Diagnosis not present

## 2018-06-24 DIAGNOSIS — I10 Essential (primary) hypertension: Secondary | ICD-10-CM | POA: Diagnosis not present

## 2018-06-24 DIAGNOSIS — Z87891 Personal history of nicotine dependence: Secondary | ICD-10-CM | POA: Diagnosis not present

## 2018-06-24 DIAGNOSIS — I2510 Atherosclerotic heart disease of native coronary artery without angina pectoris: Secondary | ICD-10-CM | POA: Diagnosis not present

## 2018-06-24 DIAGNOSIS — Z951 Presence of aortocoronary bypass graft: Secondary | ICD-10-CM

## 2018-06-24 DIAGNOSIS — Z7982 Long term (current) use of aspirin: Secondary | ICD-10-CM | POA: Diagnosis not present

## 2018-06-25 NOTE — Progress Notes (Signed)
Cardiac Individual Treatment Plan  Patient Details  Name: [name] MRN: [id number] Date of Birth: 1951/05/01 Referring Provider:     CARDIAC REHAB PHASE II ORIENTATION from 04/30/2018 in [place]  Referring Provider  [name]       Initial Encounter Date:    CARDIAC REHAB PHASE II ORIENTATION from 04/30/2018 in [place]  Date  04/30/18      Visit Diagnosis: S/P CABG (coronary artery bypass graft)  S/P AVR  Patient's Home Medications on Admission:  Current Outpatient Medications:  .  aspirin EC 325 MG EC tablet, Take 1 tablet (325 mg total) by mouth daily., Disp: , Rfl: 0 .  Cyanocobalamin (VITAMIN B-12 PO), Take 1 tablet by mouth daily., Disp: , Rfl:  .  dapagliflozin propanediol (FARXIGA) 5 MG TABS tablet, Take 5 mg by mouth daily., Disp: 30 tablet, Rfl: 5 .  furosemide (LASIX) 20 MG tablet, TAKE 1 TABLET BY MOUTH EVERY DAY, Disp: 90 tablet, Rfl: 2 .  glucose blood (PRODIGY NO CODING BLOOD GLUC) test strip, Use as instructed to check blood sugar twice a day.  DX  E11.40, Disp: 100 each, Rfl: 1 .  hydrALAZINE (APRESOLINE) 25 MG tablet, Take 1 tablet (25 mg total) by mouth 3 (three) times daily., Disp: 90 tablet, Rfl: 3 .  insulin aspart (NOVOLOG FLEXPEN) 100 UNIT/ML FlexPen, INJECT 11-15 SUBCUTANEOUSLY DAILY BEFORE SUPPER (Patient taking differently: Inject 11 Units into the skin daily before supper. ), Disp: 15 mL, Rfl: 3 .  insulin detemir (LEVEMIR) 100 unit/ml SOLN, Inject 0.4 mLs (40 Units total) into the skin daily., Disp: 15 mL, Rfl: 11 .  Insulin Pen Needle (B-D ULTRAFINE III SHORT PEN) 31G X 8 MM MISC, USE AS DIRECTED, Disp: 100 each, Rfl: 5 .  metFORMIN (GLUCOPHAGE-XR) 500 MG 24 hr tablet, Take 2 tablets (1,000 mg total) by mouth 2 (two) times daily., Disp: 360 tablet, Rfl: 1 .  metoprolol tartrate (LOPRESSOR) 50 MG tablet, Take 1 tablet (50 mg total) by mouth 2 (two) times daily., Disp:  60 tablet, Rfl: 2 .  rosuvastatin (CRESTOR) 20 MG tablet, TAKE 1 TABLET BY MOUTH EVERY DAY, Disp: 90 tablet, Rfl: 1  Past Medical History: Past Medical History:  Diagnosis Date  . Abnormal myocardial perfusion study 2006   EF 44% ? inferoseptal ischemia. no cath done  . Breast mass, right 03/2008  . Contact dermatitis 01/31/2013  . COPD (chronic obstructive pulmonary disease) ([place])    "dx'd 12/2017"  . Coronary artery disease   . Heart murmur   . History of colonic polyps   . Hyperlipidemia   . Hypertension   . Low back pain   . Type II diabetes mellitus ([facility])     Tobacco Use: Social History   Tobacco Use  Smoking Status Former Smoker  . Packs/day: 1.00  . Years: 33.00  . Pack years: 33.00  . Types: Cigarettes  Smokeless Tobacco Never Used  Tobacco Comment   "quit in ~ 2007"    Labs: Recent Review Flowsheet Data    Labs for ITP Cardiac and Pulmonary Rehab Latest Ref Rng & Units 03/04/2018 03/04/2018 03/04/2018 03/05/2018 06/10/2018   Cholestrol 0 - 200 mg/dL - - - - 133   LDLCALC 0 - 99 mg/dL - - - - 72   LDLDIRECT mg/dL - - - - -   HDL >39.00 mg/dL - - - - 47.80   Trlycerides 0.0 - 149.0 mg/dL - - - -  63.0   Hemoglobin A1c 4.6 - 6.5 % - - - - 7.3(H)   PHART 7.350 - 7.450 - 7.317(L) 7.315(L) - -   PCO2ART 32.0 - 48.0 mmHg - 45.8 46.9 - -   HCO3 20.0 - 28.0 mmol/L - 23.4 23.9 - -   TCO2 22 - 32 mmol/L 24 25 25 26  -   ACIDBASEDEF 0.0 - 2.0 mmol/L - 3.0(H) 2.0 - -   O2SAT % - 95.0 92.0 - -      Capillary Blood Glucose: Lab Results  Component Value Date   GLUCAP 151 (H) 05/11/2018   GLUCAP 116 (H) 05/06/2018   GLUCAP 89 05/06/2018   GLUCAP 150 (H) 03/10/2018   GLUCAP 106 (H) 03/10/2018     Exercise Target Goals: Exercise Program Goal: Individual exercise prescription set using results from initial 6 min walk test and THRR while considering  patient's activity barriers and safety.   Exercise Prescription Goal: Starting with aerobic activity 30 plus minutes a  day, 3 days per week for initial exercise prescription. Provide home exercise prescription and guidelines that participant acknowledges understanding prior to discharge.  Activity Barriers & Risk Stratification: Activity Barriers & Cardiac Risk Stratification - 04/30/18 1134      Activity Barriers & Cardiac Risk Stratification   Activity Barriers  Deconditioning    Cardiac Risk Stratification  High       6 Minute Walk: 6 Minute Walk    Row Name 04/30/18 1132         6 Minute Walk   Phase  Initial     Distance  1600 feet     Walk Time  6 minutes     # of Rest Breaks  0     MPH  3.03     METS  4.29     RPE  12     Perceived Dyspnea   0     VO2 Peak  14.99     Symptoms  No     Resting HR  79 bpm     Resting BP  132/78     Resting Oxygen Saturation   96 %     Exercise Oxygen Saturation  during 6 min walk  96 %     Max Ex. HR  110 bpm     Max Ex. BP  142/82     2 Minute Post BP  142/80        Oxygen Initial Assessment:   Oxygen Re-Evaluation:   Oxygen Discharge (Final Oxygen Re-Evaluation):   Initial Exercise Prescription: Initial Exercise Prescription - 04/30/18 1100      Date of Initial Exercise RX and Referring Provider   Date  04/30/18    Referring Provider  [name]     Expected Discharge Date  07/30/18      Recumbant Bike   Level  2    Watts  50    Minutes  10    METs  3.97      NuStep   Level  2    SPM  75    Minutes  10    METs  2.7      Track   Laps  14    Minutes  10    METs  3.45      Prescription Details   Frequency (times per week)  3x    Duration  Progress to 30 minutes of continuous aerobic without signs/symptoms of physical distress      Intensity  THRR 40-80% of Max Heartrate  69-138    Ratings of Perceived Exertion  11-13    Perceived Dyspnea  0-4      Progression   Progression  Continue progressive overload as per policy without signs/symptoms or physical distress.      Resistance Training   Training  Prescription  Yes    Weight  3lbs    Reps  10-15       Perform Capillary Blood Glucose checks as needed.  Exercise Prescription Changes: Exercise Prescription Changes    Row Name 05/06/18 0947 05/20/18 0947 06/05/18 0950 06/15/18 0951       Response to Exercise   Blood Pressure (Admit)  122/86  122/80  124/80  112/82    Blood Pressure (Exercise)  140/86  140/78  148/92  128/80    Blood Pressure (Exit)  112/78  138/80  138/90  112/60    Heart Rate (Admit)  79 bpm  74 bpm  80 bpm  79 bpm    Heart Rate (Exercise)  89 bpm  96 bpm  102 bpm  98 bpm    Heart Rate (Exit)  77 bpm  74 bpm  76 bpm  78 bpm    Rating of Perceived Exertion (Exercise)  11  12  11  12     Symptoms  none  none  none  none    Duration  Progress to 30 minutes of  aerobic without signs/symptoms of physical distress  Progress to 30 minutes of  aerobic without signs/symptoms of physical distress  Progress to 30 minutes of  aerobic without signs/symptoms of physical distress  Progress to 30 minutes of  aerobic without signs/symptoms of physical distress    Intensity  THRR unchanged  THRR unchanged  THRR unchanged  THRR unchanged      Progression   Progression  Continue to progress workloads to maintain intensity without signs/symptoms of physical distress.  Continue to progress workloads to maintain intensity without signs/symptoms of physical distress.  Continue to progress workloads to maintain intensity without signs/symptoms of physical distress.  Continue to progress workloads to maintain intensity without signs/symptoms of physical distress.    Average METs  2.5  3.3  3.7  3.8      Resistance Training   Training Prescription  No Relaxation, no weights today.  No Relaxation, no weights today.  Yes  Yes    Weight  -  -  4lbs  4lbs    Reps  -  -  10-15  10-15    Time  -  -  10 Minutes  10 Minutes      Interval Training   Interval Training  No  No  No  No      Recumbant Bike   Level  2  5  6  6     Minutes  10  10   10  10     METs  2.5  3.9  4.3  3.4      NuStep   Level  2  4  5  5     SPM  75  75  75  75    Minutes  10  10  10  10     METs  1.9  2.5  2.9  4.2      Track   Laps  14  14  16  16     Minutes  10  10  10  10     METs  3.44  3.44  3.78  3.78      Home Exercise Plan   Plans to continue exercise at  -  Home (comment)  Home (comment)  Home (comment)    Frequency  -  Add 2 additional days to program exercise sessions.  Add 2 additional days to program exercise sessions.  Add 2 additional days to program exercise sessions.    Initial Home Exercises Provided  -  05/15/18  05/15/18  05/15/18       Exercise Comments: Exercise Comments    Row Name 05/06/18 1043 05/15/18 1010 05/20/18 1013 06/05/18 0945 06/17/18 1000   Exercise Comments  Patient low intensity exercise well without c/o.  Reviewed home exercise guidelines with patient.  Reviewed METs and goals with patient.  METs reviewed with patient.  Reviewed METs and goals with patient.      Exercise Goals and Review: Exercise Goals    Row Name 04/30/18 1133             Exercise Goals   Increase Physical Activity  Yes       Intervention  Provide advice, education, support and counseling about physical activity/exercise needs.;Develop an individualized exercise prescription for aerobic and resistive training based on initial evaluation findings, risk stratification, comorbidities and participant's personal goals.       Expected Outcomes  Short Term: Attend rehab on a regular basis to increase amount of physical activity.;Long Term: Add in home exercise to make exercise part of routine and to increase amount of physical activity.;Long Term: Exercising regularly at least 3-5 days a week.       Increase Strength and Stamina  Yes       Intervention  Provide advice, education, support and counseling about physical activity/exercise needs.;Develop an individualized exercise prescription for aerobic and resistive training based on initial  evaluation findings, risk stratification, comorbidities and participant's personal goals.       Expected Outcomes  Short Term: Increase workloads from initial exercise prescription for resistance, speed, and METs.;Short Term: Perform resistance training exercises routinely during rehab and add in resistance training at home;Long Term: Improve cardiorespiratory fitness, muscular endurance and strength as measured by increased METs and functional capacity (6MWT)       Able to understand and use rate of perceived exertion (RPE) scale  Yes       Intervention  Provide education and explanation on how to use RPE scale       Expected Outcomes  Short Term: Able to use RPE daily in rehab to express subjective intensity level;Long Term:  Able to use RPE to guide intensity level when exercising independently       Able to understand and use Dyspnea scale  Yes       Intervention  Provide education and explanation on how to use Dyspnea scale       Expected Outcomes  Short Term: Able to use Dyspnea scale daily in rehab to express subjective sense of shortness of breath during exertion;Long Term: Able to use Dyspnea scale to guide intensity level when exercising independently       Knowledge and understanding of Target Heart Rate Range (THRR)  Yes       Intervention  Provide education and explanation of THRR including how the numbers were predicted and where they are located for reference       Expected Outcomes  Short Term: Able to state/look up THRR;Long Term: Able to use THRR to govern intensity when exercising independently;Short Term: Able to use daily as guideline for intensity  in rehab       Able to check pulse independently  Yes       Intervention  Provide education and demonstration on how to check pulse in carotid and radial arteries.;Review the importance of being able to check your own pulse for safety during independent exercise       Expected Outcomes  Short Term: Able to explain why pulse checking is  important during independent exercise;Long Term: Able to check pulse independently and accurately       Understanding of Exercise Prescription  Yes       Intervention  Provide education, explanation, and written materials on patient's individual exercise prescription       Expected Outcomes  Short Term: Able to explain program exercise prescription;Long Term: Able to explain home exercise prescription to exercise independently          Exercise Goals Re-Evaluation : Exercise Goals Re-Evaluation    Row Name 05/06/18 1043 05/15/18 1010 05/20/18 1013 06/17/18 1000       Exercise Goal Re-Evaluation   Exercise Goals Review  Able to understand and use rate of perceived exertion (RPE) scale  Able to understand and use rate of perceived exertion (RPE) scale;Understanding of Exercise Prescription;Increase Physical Activity;Knowledge and understanding of Target Heart Rate Range (THRR)  Able to understand and use rate of perceived exertion (RPE) scale;Understanding of Exercise Prescription;Increase Physical Activity;Knowledge and understanding of Target Heart Rate Range (THRR)  Able to understand and use rate of perceived exertion (RPE) scale;Understanding of Exercise Prescription;Increase Physical Activity;Knowledge and understanding of Target Heart Rate Range (THRR)    Comments  Patient able to understand and use RPE scale appropriately.  Reviewed home exercise guidelines with patient including THRR, RPE scale, and endpoints for exercise. Pt plans to walk as his mode of home exercise.   Patient is walking at home in addition to exercise at cardiac rehab.  Patient is walking 15 minutes at least 4 days/week and using his step maching 15 minutes at least 3 days/week.    Expected Outcomes  Increase workloads as tolerated to improve cardiorespiratory fitness and strength.  Patient will walk 30 minutes at least 2 days/week in addition to exercise at cardiac rehab to help achieve persoanl health and fitness goals.   Patient will walk at least 30 minutes 2 days/week as his mode of home exercise.  Progress workloads to help achieve health and fitness goals.        Discharge Exercise Prescription (Final Exercise Prescription Changes): Exercise Prescription Changes - 06/15/18 0951      Response to Exercise   Blood Pressure (Admit)  112/82    Blood Pressure (Exercise)  128/80    Blood Pressure (Exit)  112/60    Heart Rate (Admit)  79 bpm    Heart Rate (Exercise)  98 bpm    Heart Rate (Exit)  78 bpm    Rating of Perceived Exertion (Exercise)  12    Symptoms  none    Duration  Progress to 30 minutes of  aerobic without signs/symptoms of physical distress    Intensity  THRR unchanged      Progression   Progression  Continue to progress workloads to maintain intensity without signs/symptoms of physical distress.    Average METs  3.8      Resistance Training   Training Prescription  Yes    Weight  4lbs    Reps  10-15    Time  10 Minutes      Interval Training  Interval Training  No      Recumbant Bike   Level  6    Minutes  10    METs  3.4      NuStep   Level  5    SPM  75    Minutes  10    METs  4.2      Track   Laps  16    Minutes  10    METs  3.78      Home Exercise Plan   Plans to continue exercise at  Home (comment)    Frequency  Add 2 additional days to program exercise sessions.    Initial Home Exercises Provided  05/15/18       Nutrition:  Target Goals: Understanding of nutrition guidelines, daily intake of sodium 1500mg , cholesterol 200mg , calories 30% from fat and 7% or less from saturated fats, daily to have 5 or more servings of fruits and vegetables.  Biometrics: Pre Biometrics - 04/30/18 1132      Pre Biometrics   Height  6' 2.5" (1.892 m)    Weight  81.3 kg    Waist Circumference  35 inches    Hip Circumference  37 inches    Waist to Hip Ratio  0.95 %    BMI (Calculated)  22.71    Triceps Skinfold  14 mm    % Body Fat  22.5 %    Grip Strength  39 kg     Flexibility  35 in    Single Leg Stand  21.19 seconds        Nutrition Therapy Plan and Nutrition Goals: Nutrition Therapy & Goals - 04/30/18 0855      Nutrition Therapy   Diet  carb modified heart healthy      Personal Nutrition Goals   Nutrition Goal  Pt to identify and limit food sources of saturated fat, trans fat, refined carbohydrates and sodium    Personal Goal #2  Pt able to name foods that affect blood glucose.    Personal Goal #3  Improved blood glucose control as evidenced by pt's A1c trending from 7.3 toward less than 7.0.      Intervention Plan   Intervention  Prescribe, educate and counsel regarding individualized specific dietary modifications aiming towards targeted core components such as weight, hypertension, lipid management, diabetes, heart failure and other comorbidities.    Expected Outcomes  Short Term Goal: Understand basic principles of dietary content, such as calories, fat, sodium, cholesterol and nutrients.       Nutrition Assessments: Nutrition Assessments - 04/30/18 0859      MEDFICTS Scores   Pre Score  32       Nutrition Goals Re-Evaluation:   Nutrition Goals Discharge (Final Nutrition Goals Re-Evaluation):   Psychosocial: Target Goals: Acknowledge presence or absence of significant depression and/or stress, maximize coping skills, provide positive support system. Participant is able to verbalize types and ability to use techniques and skills needed for reducing stress and depression.  Initial Review & Psychosocial Screening: Initial Psych Review & Screening - 04/30/18 1340      Initial Review   Current issues with  None Identified      Family Dynamics   Good Support System?  Yes   La lists his wife as a source of support.      Barriers   Psychosocial barriers to participate in program  There are no identifiable barriers or psychosocial needs.      Screening Interventions  Interventions  Encouraged to exercise        Quality of Life Scores: Quality of Life - 05/15/18 1600      Quality of Life   Select  Quality of Life      Quality of Life Scores   Health/Function Pre  25.23 %    Socioeconomic Pre  27.93 %    Psych/Spiritual Pre  29.29 %    Family Pre  27 %    GLOBAL Pre  26.88 %      Scores of 19 and below usually indicate a poorer quality of life in these areas.  A difference of  2-3 points is a clinically meaningful difference.  A difference of 2-3 points in the total score of the Quality of Life Index has been associated with significant improvement in overall quality of life, self-image, physical symptoms, and general health in studies assessing change in quality of life.  PHQ-9: Recent Review Flowsheet Data    Depression screen [facility] 2/9 05/06/2018 03/12/2018 02/05/2017 05/07/2013   Decreased Interest 0 0 1 0   Down, Depressed, Hopeless 0 0 0 0   PHQ - 2 Score 0 0 1 0   Altered sleeping - 0 1 -   Tired, decreased energy - 0 0 -   Change in appetite - 1 0 -   Feeling bad or failure about yourself  - 0 0 -   Trouble concentrating - 0 0 -   Moving slowly or fidgety/restless - 0 0 -   Suicidal thoughts - 0 0 -   PHQ-9 Score - 1 2 -     Interpretation of Total Score  Total Score Depression Severity:  1-4 = Minimal depression, 5-9 = Mild depression, 10-14 = Moderate depression, 15-19 = Moderately severe depression, 20-27 = Severe depression   Psychosocial Evaluation and Intervention:   Psychosocial Re-Evaluation: Psychosocial Re-Evaluation    Row Name 05/28/18 1121 06/25/18 1114           Psychosocial Re-Evaluation   Current issues with  None Identified  None Identified      Interventions  Encouraged to attend Cardiac Rehabilitation for the exercise  Encouraged to attend Cardiac Rehabilitation for the exercise      Continue Psychosocial Services   No Follow up required  No Follow up required         Psychosocial Discharge (Final Psychosocial Re-Evaluation): Psychosocial  Re-Evaluation - 06/25/18 1114      Psychosocial Re-Evaluation   Current issues with  None Identified    Interventions  Encouraged to attend Cardiac Rehabilitation for the exercise    Continue Psychosocial Services   No Follow up required       Vocational Rehabilitation: Provide vocational rehab assistance to qualifying candidates.   Vocational Rehab Evaluation & Intervention:   Education: Education Goals: Education classes will be provided on a weekly basis, covering required topics. Participant will state understanding/return demonstration of topics presented.  Learning Barriers/Preferences: Learning Barriers/Preferences - 04/30/18 0817      Learning Barriers/Preferences   Learning Barriers  Sight    Learning Preferences  Written Material;Skilled Demonstration;Verbal Instruction       Education Topics: Hypertension, Hypertension Reduction -Define heart disease and high blood pressure. Discus how high blood pressure affects the body and ways to reduce high blood pressure.   Exercise and Your Heart -Discuss why it is important to exercise, the FITT principles of exercise, normal and abnormal responses to exercise, and how to exercise safely.   Angina -Discuss  definition of angina, causes of angina, treatment of angina, and how to decrease risk of having angina.   Cardiac Medications -Review what the following cardiac medications are used for, how they affect the body, and side effects that may occur when taking the medications.  Medications include Aspirin, Beta blockers, calcium channel blockers, ACE Inhibitors, angiotensin receptor blockers, diuretics, digoxin, and antihyperlipidemics.   Congestive Heart Failure -Discuss the definition of CHF, how to live with CHF, the signs and symptoms of CHF, and how keep track of weight and sodium intake.   Heart Disease and Intimacy -Discus the effect sexual activity has on the heart, how changes occur during intimacy as we age,  and safety during sexual activity.   Smoking Cessation / COPD -Discuss different methods to quit smoking, the health benefits of quitting smoking, and the definition of COPD.   Nutrition I: Fats -Discuss the types of cholesterol, what cholesterol does to the heart, and how cholesterol levels can be controlled.   Nutrition II: Labels -Discuss the different components of food labels and how to read food label   Heart Parts/Heart Disease and PAD -Discuss the anatomy of the heart, the pathway of blood circulation through the heart, and these are affected by heart disease.   Stress I: Signs and Symptoms -Discuss the causes of stress, how stress may lead to anxiety and depression, and ways to limit stress.   Stress II: Relaxation -Discuss different types of relaxation techniques to limit stress.   Warning Signs of Stroke / TIA -Discuss definition of a stroke, what the signs and symptoms are of a stroke, and how to identify when someone is having stroke.   Knowledge Questionnaire Score: Knowledge Questionnaire Score - 05/15/18 1010      Knowledge Questionnaire Score   Pre Score  15/24   Did not finish quiz.      Core Components/Risk Factors/Patient Goals at Admission: Personal Goals and Risk Factors at Admission - 04/30/18 1147      Core Components/Risk Factors/Patient Goals on Admission    Weight Management  Yes;Weight Maintenance    Intervention  Weight Management: Develop a combined nutrition and exercise program designed to reach desired caloric intake, while maintaining appropriate intake of nutrient and fiber, sodium and fats, and appropriate energy expenditure required for the weight goal.;Weight Management: Provide education and appropriate resources to help participant work on and attain dietary goals.;Weight Management/Obesity: Establish reasonable short term and long term weight goals.    Admit Weight  179 lb 3.7 oz (81.3 kg)    Expected Outcomes  Long Term:  Adherence to nutrition and physical activity/exercise program aimed toward attainment of established weight goal;Short Term: Continue to assess and modify interventions until short term weight is achieved;Understanding recommendations for meals to include 15-35% energy as protein, 25-35% energy from fat, 35-60% energy from carbohydrates, less than 200mg  of dietary cholesterol, 20-35 gm of total fiber daily;Understanding of distribution of calorie intake throughout the day with the consumption of 4-5 meals/snacks;Weight Maintenance: Understanding of the daily nutrition guidelines, which includes 25-35% calories from fat, 7% or less cal from saturated fats, less than 200mg  cholesterol, less than 1.5gm of sodium, & 5 or more servings of fruits and vegetables daily    Improve shortness of breath with ADL's  Yes    Intervention  Provide education, individualized exercise plan and daily activity instruction to help decrease symptoms of SOB with activities of daily living.    Expected Outcomes  Short Term: Improve cardiorespiratory fitness to achieve a  reduction of symptoms when performing ADLs;Long Term: Be able to perform more ADLs without symptoms or delay the onset of symptoms    Diabetes  Yes    Intervention  Provide education about signs/symptoms and action to take for hypo/hyperglycemia.;Provide education about proper nutrition, including hydration, and aerobic/resistive exercise prescription along with prescribed medications to achieve blood glucose in normal ranges: Fasting glucose 65-99 mg/dL    Expected Outcomes  Long Term: Attainment of HbA1C < 7%.;Short Term: Participant verbalizes understanding of the signs/symptoms and immediate care of hyper/hypoglycemia, proper foot care and importance of medication, aerobic/resistive exercise and nutrition plan for blood glucose control.    Hypertension  Yes    Intervention  Monitor prescription use compliance.;Provide education on lifestyle modifcations including  regular physical activity/exercise, weight management, moderate sodium restriction and increased consumption of fresh fruit, vegetables, and low fat dairy, alcohol moderation, and smoking cessation.    Expected Outcomes  Short Term: Continued assessment and intervention until BP is < 140/28mm HG in hypertensive participants. < 130/1mm HG in hypertensive participants with diabetes, heart failure or chronic kidney disease.;Long Term: Maintenance of blood pressure at goal levels.    Lipids  Yes    Intervention  Provide education and support for participant on nutrition & aerobic/resistive exercise along with prescribed medications to achieve LDL 70mg , HDL >40mg .    Expected Outcomes  Short Term: Participant states understanding of desired cholesterol values and is compliant with medications prescribed. Participant is following exercise prescription and nutrition guidelines.;Long Term: Cholesterol controlled with medications as prescribed, with individualized exercise RX and with personalized nutrition plan. Value goals: LDL < 70mg , HDL > 40 mg.       Core Components/Risk Factors/Patient Goals Review:  Goals and Risk Factor Review    Row Name 05/28/18 1112 06/25/18 1114           Core Components/Risk Factors/Patient Goals Review   Personal Goals Review  Weight Management/Obesity;Hypertension;Diabetes;Lipids  Weight Management/Obesity;Hypertension;Diabetes;Lipids      Review  Marquie's vital signs  and CBG'shave been stable at cardiac rehab. Severus's has lost about 2 pounds since starting exercise. Will continue to monitor weight.  Eufemio's vital signs  and CBG'shave been stable at cardiac rehab. Pavlos's weight has remained at 78 kg. Will continue to monitor weight.      Expected Outcomes  Patient will continue to participate in phase 2 cardiac rehab for exercise, nutrition and lifestyle modification opportunites.  Patient will continue to participate in phase 2 cardiac rehab for exercise, nutrition  and lifestyle modification opportunites.         Core Components/Risk Factors/Patient Goals at Discharge (Final Review):  Goals and Risk Factor Review - 06/25/18 1114      Core Components/Risk Factors/Patient Goals Review   Personal Goals Review  Weight Management/Obesity;Hypertension;Diabetes;Lipids    Review  Jeferson's vital signs  and CBG'shave been stable at cardiac rehab. Dylin's weight has remained at 78 kg. Will continue to monitor weight.    Expected Outcomes  Patient will continue to participate in phase 2 cardiac rehab for exercise, nutrition and lifestyle modification opportunites.       ITP Comments: ITP Comments    Row Name 04/30/18 0756 05/28/18 1107 06/25/18 1113       ITP Comments  Dr. Fransico Him. Medical Director   30 Day ITP Review. Patient with good attendance and participation in phase 2 cardiac rehab.  30 Day ITP Review. Patient with good attendance and participation in phase 2 cardiac rehab.  Comments: See ITP comments.[name], RN,BSN 06/25/2018 11:18 AM

## 2018-06-26 ENCOUNTER — Encounter (HOSPITAL_COMMUNITY): Payer: Medicare Other

## 2018-06-26 ENCOUNTER — Encounter (HOSPITAL_COMMUNITY)
Admission: RE | Admit: 2018-06-26 | Discharge: 2018-06-26 | Disposition: A | Discharge status: Home / Self Care | Payer: Medicare Other | Source: Ambulatory Visit | Attending: Cardiology | Admitting: Cardiology

## 2018-06-26 DIAGNOSIS — Z86010 Personal history of colonic polyps: Secondary | ICD-10-CM | POA: Insufficient documentation

## 2018-06-26 DIAGNOSIS — E785 Hyperlipidemia, unspecified: Secondary | ICD-10-CM | POA: Insufficient documentation

## 2018-06-26 DIAGNOSIS — Z7982 Long term (current) use of aspirin: Secondary | ICD-10-CM | POA: Diagnosis not present

## 2018-06-26 DIAGNOSIS — Z794 Long term (current) use of insulin: Secondary | ICD-10-CM | POA: Diagnosis not present

## 2018-06-26 DIAGNOSIS — I10 Essential (primary) hypertension: Secondary | ICD-10-CM | POA: Diagnosis not present

## 2018-06-26 DIAGNOSIS — Z951 Presence of aortocoronary bypass graft: Secondary | ICD-10-CM | POA: Diagnosis not present

## 2018-06-26 DIAGNOSIS — Z79899 Other long term (current) drug therapy: Secondary | ICD-10-CM | POA: Diagnosis not present

## 2018-06-26 DIAGNOSIS — Z87891 Personal history of nicotine dependence: Secondary | ICD-10-CM | POA: Diagnosis not present

## 2018-06-26 DIAGNOSIS — Z952 Presence of prosthetic heart valve: Secondary | ICD-10-CM | POA: Insufficient documentation

## 2018-06-26 DIAGNOSIS — J449 Chronic obstructive pulmonary disease, unspecified: Secondary | ICD-10-CM | POA: Diagnosis not present

## 2018-06-26 DIAGNOSIS — I2510 Atherosclerotic heart disease of native coronary artery without angina pectoris: Secondary | ICD-10-CM | POA: Diagnosis not present

## 2018-06-26 DIAGNOSIS — E119 Type 2 diabetes mellitus without complications: Secondary | ICD-10-CM | POA: Diagnosis not present

## 2018-06-29 ENCOUNTER — Encounter (HOSPITAL_COMMUNITY)
Admission: RE | Admit: 2018-06-29 | Discharge: 2018-06-29 | Disposition: A | Discharge status: Home / Self Care | Payer: Medicare Other | Source: Ambulatory Visit | Attending: Cardiology | Admitting: Cardiology

## 2018-06-29 ENCOUNTER — Encounter (HOSPITAL_COMMUNITY): Payer: Medicare Other

## 2018-06-29 DIAGNOSIS — Z951 Presence of aortocoronary bypass graft: Secondary | ICD-10-CM

## 2018-06-29 DIAGNOSIS — Z87891 Personal history of nicotine dependence: Secondary | ICD-10-CM | POA: Diagnosis not present

## 2018-06-29 DIAGNOSIS — Z7982 Long term (current) use of aspirin: Secondary | ICD-10-CM | POA: Diagnosis not present

## 2018-06-29 DIAGNOSIS — Z952 Presence of prosthetic heart valve: Secondary | ICD-10-CM | POA: Diagnosis not present

## 2018-06-29 DIAGNOSIS — I2510 Atherosclerotic heart disease of native coronary artery without angina pectoris: Secondary | ICD-10-CM | POA: Diagnosis not present

## 2018-06-29 DIAGNOSIS — I10 Essential (primary) hypertension: Secondary | ICD-10-CM | POA: Diagnosis not present

## 2018-07-01 ENCOUNTER — Encounter (HOSPITAL_COMMUNITY): Payer: Medicare Other

## 2018-07-01 ENCOUNTER — Encounter (HOSPITAL_COMMUNITY)
Admission: RE | Admit: 2018-07-01 | Discharge: 2018-07-01 | Disposition: A | Discharge status: Home / Self Care | Payer: Medicare Other | Source: Ambulatory Visit | Attending: Cardiology | Admitting: Cardiology

## 2018-07-01 DIAGNOSIS — Z952 Presence of prosthetic heart valve: Secondary | ICD-10-CM | POA: Diagnosis not present

## 2018-07-01 DIAGNOSIS — I10 Essential (primary) hypertension: Secondary | ICD-10-CM | POA: Diagnosis not present

## 2018-07-01 DIAGNOSIS — Z951 Presence of aortocoronary bypass graft: Secondary | ICD-10-CM | POA: Diagnosis not present

## 2018-07-01 DIAGNOSIS — Z87891 Personal history of nicotine dependence: Secondary | ICD-10-CM | POA: Diagnosis not present

## 2018-07-01 DIAGNOSIS — I2510 Atherosclerotic heart disease of native coronary artery without angina pectoris: Secondary | ICD-10-CM | POA: Diagnosis not present

## 2018-07-01 DIAGNOSIS — Z7982 Long term (current) use of aspirin: Secondary | ICD-10-CM | POA: Diagnosis not present

## 2018-07-03 ENCOUNTER — Encounter (HOSPITAL_COMMUNITY): Payer: Medicare Other

## 2018-07-03 ENCOUNTER — Encounter (HOSPITAL_COMMUNITY)
Admission: RE | Admit: 2018-07-03 | Discharge: 2018-07-03 | Disposition: A | Discharge status: Home / Self Care | Payer: Medicare Other | Source: Ambulatory Visit | Attending: Cardiology | Admitting: Cardiology

## 2018-07-03 DIAGNOSIS — Z952 Presence of prosthetic heart valve: Secondary | ICD-10-CM | POA: Diagnosis not present

## 2018-07-03 DIAGNOSIS — Z7982 Long term (current) use of aspirin: Secondary | ICD-10-CM | POA: Diagnosis not present

## 2018-07-03 DIAGNOSIS — Z951 Presence of aortocoronary bypass graft: Secondary | ICD-10-CM | POA: Diagnosis not present

## 2018-07-03 DIAGNOSIS — I2510 Atherosclerotic heart disease of native coronary artery without angina pectoris: Secondary | ICD-10-CM | POA: Diagnosis not present

## 2018-07-03 DIAGNOSIS — Z87891 Personal history of nicotine dependence: Secondary | ICD-10-CM | POA: Diagnosis not present

## 2018-07-03 DIAGNOSIS — I10 Essential (primary) hypertension: Secondary | ICD-10-CM | POA: Diagnosis not present

## 2018-07-06 ENCOUNTER — Encounter (HOSPITAL_COMMUNITY)
Admission: RE | Admit: 2018-07-06 | Discharge: 2018-07-06 | Disposition: A | Discharge status: Home / Self Care | Payer: Medicare Other | Source: Ambulatory Visit | Attending: Cardiology | Admitting: Cardiology

## 2018-07-06 ENCOUNTER — Encounter (HOSPITAL_COMMUNITY): Payer: Medicare Other

## 2018-07-06 DIAGNOSIS — I10 Essential (primary) hypertension: Secondary | ICD-10-CM | POA: Diagnosis not present

## 2018-07-06 DIAGNOSIS — Z951 Presence of aortocoronary bypass graft: Secondary | ICD-10-CM

## 2018-07-06 DIAGNOSIS — I2510 Atherosclerotic heart disease of native coronary artery without angina pectoris: Secondary | ICD-10-CM | POA: Diagnosis not present

## 2018-07-06 DIAGNOSIS — Z952 Presence of prosthetic heart valve: Secondary | ICD-10-CM | POA: Diagnosis not present

## 2018-07-06 DIAGNOSIS — Z7982 Long term (current) use of aspirin: Secondary | ICD-10-CM | POA: Diagnosis not present

## 2018-07-06 DIAGNOSIS — Z87891 Personal history of nicotine dependence: Secondary | ICD-10-CM | POA: Diagnosis not present

## 2018-07-08 ENCOUNTER — Encounter (HOSPITAL_COMMUNITY): Payer: Medicare Other

## 2018-07-08 ENCOUNTER — Encounter (HOSPITAL_COMMUNITY)
Admission: RE | Admit: 2018-07-08 | Discharge: 2018-07-08 | Disposition: A | Discharge status: Home / Self Care | Payer: Medicare Other | Source: Ambulatory Visit | Attending: Cardiology | Admitting: Cardiology

## 2018-07-08 DIAGNOSIS — I10 Essential (primary) hypertension: Secondary | ICD-10-CM | POA: Diagnosis not present

## 2018-07-08 DIAGNOSIS — Z952 Presence of prosthetic heart valve: Secondary | ICD-10-CM

## 2018-07-08 DIAGNOSIS — Z951 Presence of aortocoronary bypass graft: Secondary | ICD-10-CM

## 2018-07-08 DIAGNOSIS — Z87891 Personal history of nicotine dependence: Secondary | ICD-10-CM | POA: Diagnosis not present

## 2018-07-08 DIAGNOSIS — I2510 Atherosclerotic heart disease of native coronary artery without angina pectoris: Secondary | ICD-10-CM | POA: Diagnosis not present

## 2018-07-08 DIAGNOSIS — Z7982 Long term (current) use of aspirin: Secondary | ICD-10-CM | POA: Diagnosis not present

## 2018-07-10 ENCOUNTER — Encounter (HOSPITAL_COMMUNITY): Payer: Medicare Other

## 2018-07-10 ENCOUNTER — Encounter (HOSPITAL_COMMUNITY)
Admission: RE | Admit: 2018-07-10 | Discharge: 2018-07-10 | Disposition: A | Discharge status: Home / Self Care | Payer: Medicare Other | Source: Ambulatory Visit | Attending: Cardiology | Admitting: Cardiology

## 2018-07-10 DIAGNOSIS — I2510 Atherosclerotic heart disease of native coronary artery without angina pectoris: Secondary | ICD-10-CM | POA: Diagnosis not present

## 2018-07-10 DIAGNOSIS — Z951 Presence of aortocoronary bypass graft: Secondary | ICD-10-CM | POA: Diagnosis not present

## 2018-07-10 DIAGNOSIS — I10 Essential (primary) hypertension: Secondary | ICD-10-CM | POA: Diagnosis not present

## 2018-07-10 DIAGNOSIS — Z87891 Personal history of nicotine dependence: Secondary | ICD-10-CM | POA: Diagnosis not present

## 2018-07-10 DIAGNOSIS — Z952 Presence of prosthetic heart valve: Secondary | ICD-10-CM | POA: Diagnosis not present

## 2018-07-10 DIAGNOSIS — Z7982 Long term (current) use of aspirin: Secondary | ICD-10-CM | POA: Diagnosis not present

## 2018-07-13 ENCOUNTER — Encounter (HOSPITAL_COMMUNITY): Payer: Medicare Other

## 2018-07-13 ENCOUNTER — Encounter (HOSPITAL_COMMUNITY)
Admission: RE | Admit: 2018-07-13 | Discharge: 2018-07-13 | Disposition: A | Discharge status: Home / Self Care | Payer: Medicare Other | Source: Ambulatory Visit | Attending: Cardiology | Admitting: Cardiology

## 2018-07-13 DIAGNOSIS — Z87891 Personal history of nicotine dependence: Secondary | ICD-10-CM | POA: Diagnosis not present

## 2018-07-13 DIAGNOSIS — Z952 Presence of prosthetic heart valve: Secondary | ICD-10-CM

## 2018-07-13 DIAGNOSIS — I2510 Atherosclerotic heart disease of native coronary artery without angina pectoris: Secondary | ICD-10-CM | POA: Diagnosis not present

## 2018-07-13 DIAGNOSIS — Z951 Presence of aortocoronary bypass graft: Secondary | ICD-10-CM | POA: Diagnosis not present

## 2018-07-13 DIAGNOSIS — I10 Essential (primary) hypertension: Secondary | ICD-10-CM | POA: Diagnosis not present

## 2018-07-13 DIAGNOSIS — Z7982 Long term (current) use of aspirin: Secondary | ICD-10-CM | POA: Diagnosis not present

## 2018-07-15 ENCOUNTER — Encounter (HOSPITAL_COMMUNITY)
Admission: RE | Admit: 2018-07-15 | Discharge: 2018-07-15 | Disposition: A | Discharge status: Home / Self Care | Payer: Medicare Other | Source: Ambulatory Visit | Attending: Cardiology | Admitting: Cardiology

## 2018-07-15 ENCOUNTER — Encounter (HOSPITAL_COMMUNITY): Payer: Medicare Other

## 2018-07-15 DIAGNOSIS — I10 Essential (primary) hypertension: Secondary | ICD-10-CM | POA: Diagnosis not present

## 2018-07-15 DIAGNOSIS — Z951 Presence of aortocoronary bypass graft: Secondary | ICD-10-CM | POA: Diagnosis not present

## 2018-07-15 DIAGNOSIS — Z952 Presence of prosthetic heart valve: Secondary | ICD-10-CM

## 2018-07-15 DIAGNOSIS — Z87891 Personal history of nicotine dependence: Secondary | ICD-10-CM | POA: Diagnosis not present

## 2018-07-15 DIAGNOSIS — Z7982 Long term (current) use of aspirin: Secondary | ICD-10-CM | POA: Diagnosis not present

## 2018-07-15 DIAGNOSIS — I2510 Atherosclerotic heart disease of native coronary artery without angina pectoris: Secondary | ICD-10-CM | POA: Diagnosis not present

## 2018-07-15 NOTE — Progress Notes (Signed)
Cardiac Individual Treatment Plan  Patient Details  Name: [name] MRN: [id number] Date of Birth: Jan 23, 1951 Referring Provider:     CARDIAC REHAB PHASE II ORIENTATION from 04/30/2018 in [place]  Referring Provider  [name]       Initial Encounter Date:    CARDIAC REHAB PHASE II ORIENTATION from 04/30/2018 in [place]  Date  04/30/18      Visit Diagnosis: S/P AVR  S/P CABG (coronary artery bypass graft)  Patient's Home Medications on Admission:  Current Outpatient Medications:  .  aspirin EC 325 MG EC tablet, Take 1 tablet (325 mg total) by mouth daily., Disp: , Rfl: 0 .  Cyanocobalamin (VITAMIN B-12 PO), Take 1 tablet by mouth daily., Disp: , Rfl:  .  dapagliflozin propanediol (FARXIGA) 5 MG TABS tablet, Take 5 mg by mouth daily., Disp: 30 tablet, Rfl: 5 .  furosemide (LASIX) 20 MG tablet, TAKE 1 TABLET BY MOUTH EVERY DAY, Disp: 90 tablet, Rfl: 2 .  glucose blood (PRODIGY NO CODING BLOOD GLUC) test strip, Use as instructed to check blood sugar twice a day.  DX  E11.40, Disp: 100 each, Rfl: 1 .  hydrALAZINE (APRESOLINE) 25 MG tablet, Take 1 tablet (25 mg total) by mouth 3 (three) times daily., Disp: 90 tablet, Rfl: 3 .  insulin aspart (NOVOLOG FLEXPEN) 100 UNIT/ML FlexPen, INJECT 11-15 SUBCUTANEOUSLY DAILY BEFORE SUPPER (Patient taking differently: Inject 11 Units into the skin daily before supper. ), Disp: 15 mL, Rfl: 3 .  insulin detemir (LEVEMIR) 100 unit/ml SOLN, Inject 0.4 mLs (40 Units total) into the skin daily., Disp: 15 mL, Rfl: 11 .  Insulin Pen Needle (B-D ULTRAFINE III SHORT PEN) 31G X 8 MM MISC, USE AS DIRECTED, Disp: 100 each, Rfl: 5 .  metFORMIN (GLUCOPHAGE-XR) 500 MG 24 hr tablet, Take 2 tablets (1,000 mg total) by mouth 2 (two) times daily., Disp: 360 tablet, Rfl: 1 .  metoprolol tartrate (LOPRESSOR) 50 MG tablet, Take 1 tablet (50 mg total) by mouth 2 (two) times daily., Disp:  60 tablet, Rfl: 2 .  rosuvastatin (CRESTOR) 20 MG tablet, TAKE 1 TABLET BY MOUTH EVERY DAY, Disp: 90 tablet, Rfl: 1  Past Medical History: Past Medical History:  Diagnosis Date  . Abnormal myocardial perfusion study 2006   EF 44% ? inferoseptal ischemia. no cath done  . Breast mass, right 03/2008  . Contact dermatitis 01/31/2013  . COPD (chronic obstructive pulmonary disease) ([place])    "dx'd 12/2017"  . Coronary artery disease   . Heart murmur   . History of colonic polyps   . Hyperlipidemia   . Hypertension   . Low back pain   . Type II diabetes mellitus ([facility])     Tobacco Use: Social History   Tobacco Use  Smoking Status Former Smoker  . Packs/day: 1.00  . Years: 33.00  . Pack years: 33.00  . Types: Cigarettes  Smokeless Tobacco Never Used  Tobacco Comment   "quit in ~ 2007"    Labs: Recent Review Flowsheet Data    Labs for ITP Cardiac and Pulmonary Rehab Latest Ref Rng & Units 03/04/2018 03/04/2018 03/04/2018 03/05/2018 06/10/2018   Cholestrol 0 - 200 mg/dL - - - - 133   LDLCALC 0 - 99 mg/dL - - - - 72   LDLDIRECT mg/dL - - - - -   HDL >39.00 mg/dL - - - - 47.80   Trlycerides 0.0 - 149.0 mg/dL - - - -  63.0   Hemoglobin A1c 4.6 - 6.5 % - - - - 7.3(H)   PHART 7.350 - 7.450 - 7.317(L) 7.315(L) - -   PCO2ART 32.0 - 48.0 mmHg - 45.8 46.9 - -   HCO3 20.0 - 28.0 mmol/L - 23.4 23.9 - -   TCO2 22 - 32 mmol/L 24 25 25 26  -   ACIDBASEDEF 0.0 - 2.0 mmol/L - 3.0(H) 2.0 - -   O2SAT % - 95.0 92.0 - -      Capillary Blood Glucose: Lab Results  Component Value Date   GLUCAP 151 (H) 05/11/2018   GLUCAP 116 (H) 05/06/2018   GLUCAP 89 05/06/2018   GLUCAP 150 (H) 03/10/2018   GLUCAP 106 (H) 03/10/2018     Exercise Target Goals: Exercise Program Goal: Individual exercise prescription set using results from initial 6 min walk test and THRR while considering  patient's activity barriers and safety.   Exercise Prescription Goal: Initial exercise prescription builds to 30-45  minutes a day of aerobic activity, 2-3 days per week.  Home exercise guidelines will be given to patient during program as part of exercise prescription that the participant will acknowledge.  Activity Barriers & Risk Stratification: Activity Barriers & Cardiac Risk Stratification - 04/30/18 1134      Activity Barriers & Cardiac Risk Stratification   Activity Barriers  Deconditioning    Cardiac Risk Stratification  High       6 Minute Walk: 6 Minute Walk    Row Name 04/30/18 1132         6 Minute Walk   Phase  Initial     Distance  1600 feet     Walk Time  6 minutes     # of Rest Breaks  0     MPH  3.03     METS  4.29     RPE  12     Perceived Dyspnea   0     VO2 Peak  14.99     Symptoms  No     Resting HR  79 bpm     Resting BP  132/78     Resting Oxygen Saturation   96 %     Exercise Oxygen Saturation  during 6 min walk  96 %     Max Ex. HR  110 bpm     Max Ex. BP  142/82     2 Minute Post BP  142/80        Oxygen Initial Assessment:   Oxygen Re-Evaluation:   Oxygen Discharge (Final Oxygen Re-Evaluation):   Initial Exercise Prescription: Initial Exercise Prescription - 04/30/18 1100      Date of Initial Exercise RX and Referring Provider   Date  04/30/18    Referring Provider  [name]     Expected Discharge Date  07/30/18      Recumbant Bike   Level  2    Watts  50    Minutes  10    METs  3.97      NuStep   Level  2    SPM  75    Minutes  10    METs  2.7      Track   Laps  14    Minutes  10    METs  3.45      Prescription Details   Frequency (times per week)  3x    Duration  Progress to 30 minutes of continuous aerobic without signs/symptoms of physical  distress      Intensity   THRR 40-80% of Max Heartrate  69-138    Ratings of Perceived Exertion  11-13    Perceived Dyspnea  0-4      Progression   Progression  Continue progressive overload as per policy without signs/symptoms or physical distress.      Resistance  Training   Training Prescription  Yes    Weight  3lbs    Reps  10-15       Perform Capillary Blood Glucose checks as needed.  Exercise Prescription Changes:  Exercise Prescription Changes    Row Name 05/06/18 0947 05/20/18 0947 06/05/18 0950 06/15/18 0951 07/06/18 0950     Response to Exercise   Blood Pressure (Admit)  122/86  122/80  124/80  112/82  124/80   Blood Pressure (Exercise)  140/86  140/78  148/92  128/80  134/84   Blood Pressure (Exit)  112/78  138/80  138/90  112/60  120/86   Heart Rate (Admit)  79 bpm  74 bpm  80 bpm  79 bpm  86 bpm   Heart Rate (Exercise)  89 bpm  96 bpm  102 bpm  98 bpm  106 bpm   Heart Rate (Exit)  77 bpm  74 bpm  76 bpm  78 bpm  92 bpm   Rating of Perceived Exertion (Exercise)  11  12  11  12  12    Symptoms  none  none  none  none  none   Duration  Progress to 30 minutes of  aerobic without signs/symptoms of physical distress  Progress to 30 minutes of  aerobic without signs/symptoms of physical distress  Progress to 30 minutes of  aerobic without signs/symptoms of physical distress  Progress to 30 minutes of  aerobic without signs/symptoms of physical distress  Progress to 30 minutes of  aerobic without signs/symptoms of physical distress   Intensity  THRR unchanged  THRR unchanged  THRR unchanged  THRR unchanged  THRR unchanged     Progression   Progression  Continue to progress workloads to maintain intensity without signs/symptoms of physical distress.  Continue to progress workloads to maintain intensity without signs/symptoms of physical distress.  Continue to progress workloads to maintain intensity without signs/symptoms of physical distress.  Continue to progress workloads to maintain intensity without signs/symptoms of physical distress.  Continue to progress workloads to maintain intensity without signs/symptoms of physical distress.   Average METs  2.5  3.3  3.7  3.8  3.5     Resistance Training   Training Prescription  No Relaxation, no  weights today.  No Relaxation, no weights today.  Yes  Yes  Yes   Weight  -  -  4lbs  4lbs  4lbs   Reps  -  -  10-15  10-15  10-15   Time  -  -  10 Minutes  10 Minutes  10 Minutes     Interval Training   Interval Training  No  No  No  No  No     Recumbant Bike   Level  2  5  6  6  6    Minutes  10  10  10  10  10    METs  2.5  3.9  4.3  3.4  3.67     NuStep   Level  2  4  5  5  5    SPM  75  75  75  75  75   Minutes  10  10  10  10  10    METs  1.9  2.5  2.9  4.2  3.2     Track   Laps  14  14  16  16  15    Minutes  10  10  10  10  10    METs  3.44  3.44  3.78  3.78  3.6     Home Exercise Plan   Plans to continue exercise at  -  Home (comment)  Home (comment)  Home (comment)  Home (comment)   Frequency  -  Add 2 additional days to program exercise sessions.  Add 2 additional days to program exercise sessions.  Add 2 additional days to program exercise sessions.  Add 2 additional days to program exercise sessions.   Initial Home Exercises Provided  -  05/15/18  05/15/18  05/15/18  05/15/18   Row Name 07/13/18 0950             Response to Exercise   Blood Pressure (Admit)  110/70       Blood Pressure (Exercise)  160/90       Blood Pressure (Exit)  122/80       Heart Rate (Admit)  86 bpm       Heart Rate (Exercise)  108 bpm       Heart Rate (Exit)  92 bpm       Rating of Perceived Exertion (Exercise)  11       Symptoms  none       Duration  Progress to 30 minutes of  aerobic without signs/symptoms of physical distress       Intensity  THRR unchanged         Progression   Progression  Continue to progress workloads to maintain intensity without signs/symptoms of physical distress.       Average METs  3.1         Resistance Training   Training Prescription  Yes       Weight  5lbs       Reps  10-15       Time  10 Minutes         Interval Training   Interval Training  No         Recumbant Bike   Level  6       Watts  41       Minutes  10       METs  3.57          NuStep   Level  5       SPM  75       Minutes  10       METs  2.5         Track   Laps  12       Minutes  10       METs  3.09         Home Exercise Plan   Plans to continue exercise at  Home (comment)       Frequency  Add 2 additional days to program exercise sessions.       Initial Home Exercises Provided  05/15/18          Exercise Comments:  Exercise Comments    Row Name 05/06/18 1043 05/15/18 1010 05/20/18 1013 06/05/18 0945 06/17/18 1000   Exercise Comments  Patient low intensity exercise well without c/o.  Reviewed home exercise guidelines with patient.  Reviewed METs  and goals with patient.  METs reviewed with patient.  Reviewed METs and goals with patient.   [place] Name 07/06/18 1021 07/13/18 1010         Exercise Comments  Reviewed METs with patient.  Reviewed goals with patient.         Exercise Goals and Review:  Exercise Goals    Row Name 04/30/18 1133             Exercise Goals   Increase Physical Activity  Yes       Intervention  Provide advice, education, support and counseling about physical activity/exercise needs.;Develop an individualized exercise prescription for aerobic and resistive training based on initial evaluation findings, risk stratification, comorbidities and participant's personal goals.       Expected Outcomes  Short Term: Attend rehab on a regular basis to increase amount of physical activity.;Long Term: Add in home exercise to make exercise part of routine and to increase amount of physical activity.;Long Term: Exercising regularly at least 3-5 days a week.       Increase Strength and Stamina  Yes       Intervention  Provide advice, education, support and counseling about physical activity/exercise needs.;Develop an individualized exercise prescription for aerobic and resistive training based on initial evaluation findings, risk stratification, comorbidities and participant's personal goals.       Expected Outcomes  Short Term: Increase  workloads from initial exercise prescription for resistance, speed, and METs.;Short Term: Perform resistance training exercises routinely during rehab and add in resistance training at home;Long Term: Improve cardiorespiratory fitness, muscular endurance and strength as measured by increased METs and functional capacity (6MWT)       Able to understand and use rate of perceived exertion (RPE) scale  Yes       Intervention  Provide education and explanation on how to use RPE scale       Expected Outcomes  Short Term: Able to use RPE daily in rehab to express subjective intensity level;Long Term:  Able to use RPE to guide intensity level when exercising independently       Able to understand and use Dyspnea scale  Yes       Intervention  Provide education and explanation on how to use Dyspnea scale       Expected Outcomes  Short Term: Able to use Dyspnea scale daily in rehab to express subjective sense of shortness of breath during exertion;Long Term: Able to use Dyspnea scale to guide intensity level when exercising independently       Knowledge and understanding of Target Heart Rate Range (THRR)  Yes       Intervention  Provide education and explanation of THRR including how the numbers were predicted and where they are located for reference       Expected Outcomes  Short Term: Able to state/look up THRR;Long Term: Able to use THRR to govern intensity when exercising independently;Short Term: Able to use daily as guideline for intensity in rehab       Able to check pulse independently  Yes       Intervention  Provide education and demonstration on how to check pulse in carotid and radial arteries.;Review the importance of being able to check your own pulse for safety during independent exercise       Expected Outcomes  Short Term: Able to explain why pulse checking is important during independent exercise;Long Term: Able to check pulse independently and accurately       Understanding of  Exercise  Prescription  Yes       Intervention  Provide education, explanation, and written materials on patient's individual exercise prescription       Expected Outcomes  Short Term: Able to explain program exercise prescription;Long Term: Able to explain home exercise prescription to exercise independently          Exercise Goals Re-Evaluation : Exercise Goals Re-Evaluation    Row Name 05/06/18 1043 05/15/18 1010 05/20/18 1013 06/17/18 1000 07/13/18 1010     Exercise Goal Re-Evaluation   Exercise Goals Review  Able to understand and use rate of perceived exertion (RPE) scale  Able to understand and use rate of perceived exertion (RPE) scale;Understanding of Exercise Prescription;Increase Physical Activity;Knowledge and understanding of Target Heart Rate Range (THRR)  Able to understand and use rate of perceived exertion (RPE) scale;Understanding of Exercise Prescription;Increase Physical Activity;Knowledge and understanding of Target Heart Rate Range (THRR)  Able to understand and use rate of perceived exertion (RPE) scale;Understanding of Exercise Prescription;Increase Physical Activity;Knowledge and understanding of Target Heart Rate Range (THRR)  Able to understand and use rate of perceived exertion (RPE) scale;Understanding of Exercise Prescription;Increase Physical Activity;Knowledge and understanding of Target Heart Rate Range (THRR);Increase Strength and Stamina   Comments  Patient able to understand and use RPE scale appropriately.  Reviewed home exercise guidelines with patient including THRR, RPE scale, and endpoints for exercise. Pt plans to walk as his mode of home exercise.   Patient is walking at home in addition to exercise at cardiac rehab.  Patient is walking 15 minutes at least 4 days/week and using his step maching 15 minutes at least 3 days/week.  Patient's weight is staying consistent. Patient is progressing with increasing strength and stamina.   Expected Outcomes  Increase workloads as  tolerated to improve cardiorespiratory fitness and strength.  Patient will walk 30 minutes at least 2 days/week in addition to exercise at cardiac rehab to help achieve persoanl health and fitness goals.  Patient will walk at least 30 minutes 2 days/week as his mode of home exercise.  Progress workloads to help achieve health and fitness goals.  Patient will increase hand weights from 4 to 5lbs to help increase strength.       Discharge Exercise Prescription (Final Exercise Prescription Changes): Exercise Prescription Changes - 07/13/18 0950      Response to Exercise   Blood Pressure (Admit)  110/70    Blood Pressure (Exercise)  160/90    Blood Pressure (Exit)  122/80    Heart Rate (Admit)  86 bpm    Heart Rate (Exercise)  108 bpm    Heart Rate (Exit)  92 bpm    Rating of Perceived Exertion (Exercise)  11    Symptoms  none    Duration  Progress to 30 minutes of  aerobic without signs/symptoms of physical distress    Intensity  THRR unchanged      Progression   Progression  Continue to progress workloads to maintain intensity without signs/symptoms of physical distress.    Average METs  3.1      Resistance Training   Training Prescription  Yes    Weight  5lbs    Reps  10-15    Time  10 Minutes      Interval Training   Interval Training  No      Recumbant Bike   Level  6    Watts  41    Minutes  10    METs  3.57  NuStep   Level  5    SPM  75    Minutes  10    METs  2.5      Track   Laps  12    Minutes  10    METs  3.09      Home Exercise Plan   Plans to continue exercise at  Home (comment)    Frequency  Add 2 additional days to program exercise sessions.    Initial Home Exercises Provided  05/15/18       Nutrition:  Target Goals: Understanding of nutrition guidelines, daily intake of sodium 1500mg , cholesterol 200mg , calories 30% from fat and 7% or less from saturated fats, daily to have 5 or more servings of fruits and vegetables.  Biometrics: Pre  Biometrics - 04/30/18 1132      Pre Biometrics   Height  6' 2.5" (1.892 m)    Weight  179 lb 3.7 oz (81.3 kg)    Waist Circumference  35 inches    Hip Circumference  37 inches    Waist to Hip Ratio  0.95 %    BMI (Calculated)  22.71    Triceps Skinfold  14 mm    % Body Fat  22.5 %    Grip Strength  39 kg    Flexibility  35 in    Single Leg Stand  21.19 seconds        Nutrition Therapy Plan and Nutrition Goals: Nutrition Therapy & Goals - 04/30/18 0855      Nutrition Therapy   Diet  carb modified heart healthy      Personal Nutrition Goals   Nutrition Goal  Pt to identify and limit food sources of saturated fat, trans fat, refined carbohydrates and sodium    Personal Goal #2  Pt able to name foods that affect blood glucose.    Personal Goal #3  Improved blood glucose control as evidenced by pt's A1c trending from 7.3 toward less than 7.0.      Intervention Plan   Intervention  Prescribe, educate and counsel regarding individualized specific dietary modifications aiming towards targeted core components such as weight, hypertension, lipid management, diabetes, heart failure and other comorbidities.    Expected Outcomes  Short Term Goal: Understand basic principles of dietary content, such as calories, fat, sodium, cholesterol and nutrients.       Nutrition Assessments: Nutrition Assessments - 04/30/18 0859      MEDFICTS Scores   Pre Score  32       Nutrition Goals Re-Evaluation:   Nutrition Goals Re-Evaluation:   Nutrition Goals Discharge (Final Nutrition Goals Re-Evaluation):   Psychosocial: Target Goals: Acknowledge presence or absence of significant depression and/or stress, maximize coping skills, provide positive support system. Participant is able to verbalize types and ability to use techniques and skills needed for reducing stress and depression.  Initial Review & Psychosocial Screening: Initial Psych Review & Screening - 04/30/18 1340      Initial  Review   Current issues with  None Identified      Family Dynamics   Good Support System?  Yes   Keaden lists his wife as a source of support.      Barriers   Psychosocial barriers to participate in program  There are no identifiable barriers or psychosocial needs.      Screening Interventions   Interventions  Encouraged to exercise       Quality of Life Scores: Quality of Life - 05/15/18 1600  Quality of Life   Select  Quality of Life      Quality of Life Scores   Health/Function Pre  25.23 %    Socioeconomic Pre  27.93 %    Psych/Spiritual Pre  29.29 %    Family Pre  27 %    GLOBAL Pre  26.88 %      Scores of 19 and below usually indicate a poorer quality of life in these areas.  A difference of  2-3 points is a clinically meaningful difference.  A difference of 2-3 points in the total score of the Quality of Life Index has been associated with significant improvement in overall quality of life, self-image, physical symptoms, and general health in studies assessing change in quality of life.  PHQ-9: Recent Review Flowsheet Data    Depression screen [facility] 2/9 05/06/2018 03/12/2018 02/05/2017 05/07/2013   Decreased Interest 0 0 1 0   Down, Depressed, Hopeless 0 0 0 0   PHQ - 2 Score 0 0 1 0   Altered sleeping - 0 1 -   Tired, decreased energy - 0 0 -   Change in appetite - 1 0 -   Feeling bad or failure about yourself  - 0 0 -   Trouble concentrating - 0 0 -   Moving slowly or fidgety/restless - 0 0 -   Suicidal thoughts - 0 0 -   PHQ-9 Score - 1 2 -     Interpretation of Total Score  Total Score Depression Severity:  1-4 = Minimal depression, 5-9 = Mild depression, 10-14 = Moderate depression, 15-19 = Moderately severe depression, 20-27 = Severe depression   Psychosocial Evaluation and Intervention:   Psychosocial Re-Evaluation: Psychosocial Re-Evaluation    Row Name 05/28/18 1121 06/25/18 1114 07/15/18 1351         Psychosocial Re-Evaluation   Current  issues with  None Identified  None Identified  None Identified     Interventions  Encouraged to attend Cardiac Rehabilitation for the exercise  Encouraged to attend Cardiac Rehabilitation for the exercise  Encouraged to attend Cardiac Rehabilitation for the exercise     Continue Psychosocial Services   No Follow up required  No Follow up required  No Follow up required        Psychosocial Discharge (Final Psychosocial Re-Evaluation): Psychosocial Re-Evaluation - 07/15/18 1351      Psychosocial Re-Evaluation   Current issues with  None Identified    Interventions  Encouraged to attend Cardiac Rehabilitation for the exercise    Continue Psychosocial Services   No Follow up required       Vocational Rehabilitation: Provide vocational rehab assistance to qualifying candidates.   Vocational Rehab Evaluation & Intervention:   Education: Education Goals: Education classes will be provided on a weekly basis, covering required topics. Participant will state understanding/return demonstration of topics presented.  Learning Barriers/Preferences: Learning Barriers/Preferences - 04/30/18 0817      Learning Barriers/Preferences   Learning Barriers  Sight    Learning Preferences  Written Material;Skilled Demonstration;Verbal Instruction       Education Topics: Count Your Pulse:  -Group instruction provided by verbal instruction, demonstration, patient participation and written materials to support subject.  Instructors address importance of being able to find your pulse and how to count your pulse when at home without a heart monitor.  Patients get hands on experience counting their pulse with staff help and individually.   CARDIAC REHAB PHASE II EXERCISE from 07/01/2018 in [facility]  CARDIAC REHAB  Date  05/08/18  Instruction Review Code  2- Demonstrated Understanding      Heart Attack, Angina, and Risk Factor Modification:  -Group instruction provided by verbal  instruction, video, and written materials to support subject.  Instructors address signs and symptoms of angina and heart attacks.    Also discuss risk factors for heart disease and how to make changes to improve heart health risk factors.   CARDIAC REHAB PHASE II EXERCISE from 07/01/2018 in [place]  Date  05/27/18  Instruction Review Code  2- Demonstrated Understanding      Functional Fitness:  -Group instruction provided by verbal instruction, demonstration, patient participation, and written materials to support subject.  Instructors address safety measures for doing things around the house.  Discuss how to get up and down off the floor, how to pick things up properly, how to safely get out of a chair without assistance, and balance training.   Meditation and Mindfulness:  -Group instruction provided by verbal instruction, patient participation, and written materials to support subject.  Instructor addresses importance of mindfulness and meditation practice to help reduce stress and improve awareness.  Instructor also leads participants through a meditation exercise.    Stretching for Flexibility and Mobility:  -Group instruction provided by verbal instruction, patient participation, and written materials to support subject.  Instructors lead participants through series of stretches that are designed to increase flexibility thus improving mobility.  These stretches are additional exercise for major muscle groups that are typically performed during regular warm up and cool down.   Hands Only CPR:  -Group verbal, video, and participation provides a basic overview of AHA guidelines for community CPR. Role-play of emergencies allow participants the opportunity to practice calling for help and chest compression technique with discussion of AED use.   Hypertension: -Group verbal and written instruction that provides a basic overview of hypertension including the  most recent diagnostic guidelines, risk factor reduction with self-care instructions and medication management.   CARDIAC REHAB PHASE II EXERCISE from 07/01/2018 in [place]  Date  06/05/18  Instruction Review Code  1- Verbalizes Understanding       Nutrition I class: Heart Healthy Eating:  -Group instruction provided by PowerPoint slides, verbal discussion, and written materials to support subject matter. The instructor gives an explanation and review of the Therapeutic Lifestyle Changes diet recommendations, which includes a discussion on lipid goals, dietary fat, sodium, fiber, plant stanol/sterol esters, sugar, and the components of a well-balanced, healthy diet.   Nutrition II class: Lifestyle Skills:  -Group instruction provided by PowerPoint slides, verbal discussion, and written materials to support subject matter. The instructor gives an explanation and review of label reading, grocery shopping for heart health, heart healthy recipe modifications, and ways to make healthier choices when eating out.   Diabetes Question & Answer:  -Group instruction provided by PowerPoint slides, verbal discussion, and written materials to support subject matter. The instructor gives an explanation and review of diabetes co-morbidities, pre- and post-prandial blood glucose goals, pre-exercise blood glucose goals, signs, symptoms, and treatment of hypoglycemia and hyperglycemia, and foot care basics.   Diabetes Blitz:  -Group instruction provided by PowerPoint slides, verbal discussion, and written materials to support subject matter. The instructor gives an explanation and review of the physiology behind type 1 and type 2 diabetes, diabetes medications and rational behind using different medications, pre- and post-prandial blood glucose recommendations and Hemoglobin A1c goals, diabetes diet, and  exercise including blood glucose guidelines for exercising safely.     Portion Distortion:  -Group instruction provided by PowerPoint slides, verbal discussion, written materials, and food models to support subject matter. The instructor gives an explanation of serving size versus portion size, changes in portions sizes over the last 20 years, and what consists of a serving from each food group.   Stress Management:  -Group instruction provided by verbal instruction, video, and written materials to support subject matter.  Instructors review role of stress in heart disease and how to cope with stress positively.     CARDIAC REHAB PHASE II EXERCISE from 07/01/2018 in [place]  Date  07/01/18  Instruction Review Code  2- Demonstrated Understanding      Exercising on Your Own:  -Group instruction provided by verbal instruction, power point, and written materials to support subject.  Instructors discuss benefits of exercise, components of exercise, frequency and intensity of exercise, and end points for exercise.  Also discuss use of nitroglycerin and activating EMS.  Review options of places to exercise outside of rehab.  Review guidelines for sex with heart disease.   Cardiac Drugs I:  -Group instruction provided by verbal instruction and written materials to support subject.  Instructor reviews cardiac drug classes: antiplatelets, anticoagulants, beta blockers, and statins.  Instructor discusses reasons, side effects, and lifestyle considerations for each drug class.   CARDIAC REHAB PHASE II EXERCISE from 07/01/2018 in [place]  Date  05/06/18  Instruction Review Code  2- Demonstrated Understanding      Cardiac Drugs II:  -Group instruction provided by verbal instruction and written materials to support subject.  Instructor reviews cardiac drug classes: angiotensin converting enzyme inhibitors (ACE-I), angiotensin II receptor blockers (ARBs), nitrates, and calcium channel blockers.   Instructor discusses reasons, side effects, and lifestyle considerations for each drug class.   Anatomy and Physiology of the Circulatory System:  Group verbal and written instruction and models provide basic cardiac anatomy and physiology, with the coronary electrical and arterial systems. Review of: AMI, Angina, Valve disease, Heart Failure, Peripheral Artery Disease, Cardiac Arrhythmia, Pacemakers, and the ICD.   CARDIAC REHAB PHASE II EXERCISE from 07/01/2018 in [place]  Date  05/20/18  Instruction Review Code  2- Demonstrated Understanding      Other Education:  -Group or individual verbal, written, or video instructions that support the educational goals of the cardiac rehab program.   Holiday Eating Survival Tips:  -Group instruction provided by PowerPoint slides, verbal discussion, and written materials to support subject matter. The instructor gives patients tips, tricks, and techniques to help them not only survive but enjoy the holidays despite the onslaught of food that accompanies the holidays.   Knowledge Questionnaire Score: Knowledge Questionnaire Score - 05/15/18 1010      Knowledge Questionnaire Score   Pre Score  15/24   Did not finish quiz.      Core Components/Risk Factors/Patient Goals at Admission: Personal Goals and Risk Factors at Admission - 04/30/18 1147      Core Components/Risk Factors/Patient Goals on Admission    Weight Management  Yes;Weight Maintenance    Intervention  Weight Management: Develop a combined nutrition and exercise program designed to reach desired caloric intake, while maintaining appropriate intake of nutrient and fiber, sodium and fats, and appropriate energy expenditure required for the weight goal.;Weight Management: Provide education and appropriate resources to help participant work on and attain dietary goals.;Weight  Management/Obesity: Establish reasonable short term and long term weight goals.     Admit Weight  179 lb 3.7 oz (81.3 kg)    Expected Outcomes  Long Term: Adherence to nutrition and physical activity/exercise program aimed toward attainment of established weight goal;Short Term: Continue to assess and modify interventions until short term weight is achieved;Understanding recommendations for meals to include 15-35% energy as protein, 25-35% energy from fat, 35-60% energy from carbohydrates, less than 200mg  of dietary cholesterol, 20-35 gm of total fiber daily;Understanding of distribution of calorie intake throughout the day with the consumption of 4-5 meals/snacks;Weight Maintenance: Understanding of the daily nutrition guidelines, which includes 25-35% calories from fat, 7% or less cal from saturated fats, less than 200mg  cholesterol, less than 1.5gm of sodium, & 5 or more servings of fruits and vegetables daily    Improve shortness of breath with ADL's  Yes    Intervention  Provide education, individualized exercise plan and daily activity instruction to help decrease symptoms of SOB with activities of daily living.    Expected Outcomes  Short Term: Improve cardiorespiratory fitness to achieve a reduction of symptoms when performing ADLs;Long Term: Be able to perform more ADLs without symptoms or delay the onset of symptoms    Diabetes  Yes    Intervention  Provide education about signs/symptoms and action to take for hypo/hyperglycemia.;Provide education about proper nutrition, including hydration, and aerobic/resistive exercise prescription along with prescribed medications to achieve blood glucose in normal ranges: Fasting glucose 65-99 mg/dL    Expected Outcomes  Long Term: Attainment of HbA1C < 7%.;Short Term: Participant verbalizes understanding of the signs/symptoms and immediate care of hyper/hypoglycemia, proper foot care and importance of medication, aerobic/resistive exercise and nutrition plan for blood glucose control.    Hypertension  Yes    Intervention  Monitor  prescription use compliance.;Provide education on lifestyle modifcations including regular physical activity/exercise, weight management, moderate sodium restriction and increased consumption of fresh fruit, vegetables, and low fat dairy, alcohol moderation, and smoking cessation.    Expected Outcomes  Short Term: Continued assessment and intervention until BP is < 140/74mm HG in hypertensive participants. < 130/60mm HG in hypertensive participants with diabetes, heart failure or chronic kidney disease.;Long Term: Maintenance of blood pressure at goal levels.    Lipids  Yes    Intervention  Provide education and support for participant on nutrition & aerobic/resistive exercise along with prescribed medications to achieve LDL 70mg , HDL >40mg .    Expected Outcomes  Short Term: Participant states understanding of desired cholesterol values and is compliant with medications prescribed. Participant is following exercise prescription and nutrition guidelines.;Long Term: Cholesterol controlled with medications as prescribed, with individualized exercise RX and with personalized nutrition plan. Value goals: LDL < 70mg , HDL > 40 mg.       Core Components/Risk Factors/Patient Goals Review:  Goals and Risk Factor Review    Row Name 05/28/18 1112 06/25/18 1114 07/15/18 1351         Core Components/Risk Factors/Patient Goals Review   Personal Goals Review  Weight Management/Obesity;Hypertension;Diabetes;Lipids  Weight Management/Obesity;Hypertension;Diabetes;Lipids  Weight Management/Obesity;Hypertension;Diabetes;Lipids     Review  Romero's vital signs  and CBG'shave been stable at cardiac rehab. Braxtin's has lost about 2 pounds since starting exercise. Will continue to monitor weight.  Lou's vital signs  and CBG'shave been stable at cardiac rehab. Dontarious's weight has remained at 78 kg. Will continue to monitor weight.  Anthonymichael's vital signs  and CBG'shave been stable at cardiac rehab. Elizer's weight has  remained at  78 kg. Will continue to monitor weight.     Expected Outcomes  Patient will continue to participate in phase 2 cardiac rehab for exercise, nutrition and lifestyle modification opportunites.  Patient will continue to participate in phase 2 cardiac rehab for exercise, nutrition and lifestyle modification opportunites.  Patient will continue to participate in phase 2 cardiac rehab for exercise, nutrition and lifestyle modification opportunites.        Core Components/Risk Factors/Patient Goals at Discharge (Final Review):  Goals and Risk Factor Review - 07/15/18 1351      Core Components/Risk Factors/Patient Goals Review   Personal Goals Review  Weight Management/Obesity;Hypertension;Diabetes;Lipids    Review  Lonzell's vital signs  and CBG'shave been stable at cardiac rehab. Rider's weight has remained at 78 kg. Will continue to monitor weight.    Expected Outcomes  Patient will continue to participate in phase 2 cardiac rehab for exercise, nutrition and lifestyle modification opportunites.       ITP Comments: ITP Comments    Row Name 04/30/18 0756 05/28/18 1107 06/25/18 1113 07/15/18 1406     ITP Comments  Dr. Fransico Him. Medical Director   30 Day ITP Review. Patient with good attendance and participation in phase 2 cardiac rehab.  30 Day ITP Review. Patient with good attendance and participation in phase 2 cardiac rehab.  30 Day ITP Review. Patient with good attendance and participation in phase 2 cardiac rehab.       Comments: See ITP comments.[name], RN,BSN 07/16/2018 8:51 AM

## 2018-07-17 ENCOUNTER — Encounter (HOSPITAL_COMMUNITY): Payer: Medicare Other

## 2018-07-17 ENCOUNTER — Encounter (HOSPITAL_COMMUNITY)
Admission: RE | Admit: 2018-07-17 | Discharge: 2018-07-17 | Disposition: A | Discharge status: Home / Self Care | Payer: Medicare Other | Source: Ambulatory Visit | Attending: Cardiology | Admitting: Cardiology

## 2018-07-17 DIAGNOSIS — I2510 Atherosclerotic heart disease of native coronary artery without angina pectoris: Secondary | ICD-10-CM | POA: Diagnosis not present

## 2018-07-17 DIAGNOSIS — Z952 Presence of prosthetic heart valve: Secondary | ICD-10-CM

## 2018-07-17 DIAGNOSIS — I10 Essential (primary) hypertension: Secondary | ICD-10-CM | POA: Diagnosis not present

## 2018-07-17 DIAGNOSIS — Z7982 Long term (current) use of aspirin: Secondary | ICD-10-CM | POA: Diagnosis not present

## 2018-07-17 DIAGNOSIS — Z951 Presence of aortocoronary bypass graft: Secondary | ICD-10-CM

## 2018-07-17 DIAGNOSIS — Z87891 Personal history of nicotine dependence: Secondary | ICD-10-CM | POA: Diagnosis not present

## 2018-07-20 ENCOUNTER — Encounter (HOSPITAL_COMMUNITY): Payer: Medicare Other

## 2018-07-20 ENCOUNTER — Encounter: Payer: Self-pay | Admitting: Cardiology

## 2018-07-20 ENCOUNTER — Ambulatory Visit (INDEPENDENT_AMBULATORY_CARE_PROVIDER_SITE_OTHER): Payer: Medicare Other | Admitting: Cardiology

## 2018-07-20 ENCOUNTER — Encounter (HOSPITAL_COMMUNITY)
Admission: RE | Admit: 2018-07-20 | Discharge: 2018-07-20 | Disposition: A | Discharge status: Home / Self Care | Payer: Medicare Other | Source: Ambulatory Visit | Attending: Cardiology | Admitting: Cardiology

## 2018-07-20 VITALS — BP 120/80 | HR 78 | Resp 16 | Ht 76.0 in | Wt 178.0 lb

## 2018-07-20 DIAGNOSIS — E782 Mixed hyperlipidemia: Secondary | ICD-10-CM | POA: Diagnosis not present

## 2018-07-20 DIAGNOSIS — Z952 Presence of prosthetic heart valve: Secondary | ICD-10-CM

## 2018-07-20 DIAGNOSIS — Z7982 Long term (current) use of aspirin: Secondary | ICD-10-CM | POA: Diagnosis not present

## 2018-07-20 DIAGNOSIS — I420 Dilated cardiomyopathy: Secondary | ICD-10-CM

## 2018-07-20 DIAGNOSIS — I429 Cardiomyopathy, unspecified: Secondary | ICD-10-CM

## 2018-07-20 DIAGNOSIS — I2510 Atherosclerotic heart disease of native coronary artery without angina pectoris: Secondary | ICD-10-CM

## 2018-07-20 DIAGNOSIS — Z87891 Personal history of nicotine dependence: Secondary | ICD-10-CM | POA: Diagnosis not present

## 2018-07-20 DIAGNOSIS — I10 Essential (primary) hypertension: Secondary | ICD-10-CM | POA: Diagnosis not present

## 2018-07-20 DIAGNOSIS — Z951 Presence of aortocoronary bypass graft: Secondary | ICD-10-CM

## 2018-07-20 HISTORY — DX: Cardiomyopathy, unspecified: I42.9

## 2018-07-20 NOTE — Patient Instructions (Signed)
Medication Instructions:  Your physician recommends that you continue on your current medications as directed. Please refer to the Current Medication list given to you today.  If you need a refill on your cardiac medications before your next appointment, please call your pharmacy.   Lab work: Your physician recommends that you return for lab work today: BMP   If you have labs (blood work) drawn today and your tests are completely normal, you will receive your results only by: Marland Kitchen MyChart Message (if you have MyChart) OR . A paper copy in the mail If you have any lab test that is abnormal or we need to change your treatment, we will call you to review the results.  Testing/Procedures: None.   Follow-Up: At [facility], you and your health needs are our priority.  As part of our continuing mission to provide you with exceptional heart care, we have created designated Provider Care Teams.  These Care Teams include your primary Cardiologist (physician) and Advanced Practice Providers (APPs -  Physician Assistants and Nurse Practitioners) who all work together to provide you with the care you need, when you need it. You will need a follow up appointment in 6 months.  Please call our office 2 months in advance to schedule this appointment.  You may see Jenne Campus, MD or another member of our [place] Provider Team in [place]: Shirlee More, MD . Jyl Heinz, MD  Any Other Special Instructions Will Be Listed Below (If Applicable).

## 2018-07-20 NOTE — Progress Notes (Signed)
Cardiology Office Note:    Date:  07/20/2018   ID:  [name], DOB 1950/09/15, MRN [id number]  PCP:  Debbrah Alar, NP  Cardiologist:  Jenne Campus, MD    Referring MD: Debbrah Alar, NP   No chief complaint on file. Doing very well  History of Present Illness:    [name] is a 67 y.o. male with a history of critical aortic stenosis status post aortic valve replacement done about a year ago also history of hypertension, diabetes doing very well no chest pain tightness squeezing pressure burning chest.  He does have baseline diminished left ventricular ejection fraction we were not able to put him on ACE inhibitor or ARB because of hyperkalemia he supposed to be on hydralazine however some weight this medication disappeared from the list of his medications.  Overall he is doing very well and asymptomatic no chest pain tightness squeezing pressure burning chest.  He participated in rehab and enjoyed this tremendously.  Past Medical History:  Diagnosis Date  . Abnormal myocardial perfusion study 2006   EF 44% ? inferoseptal ischemia. no cath done  . Breast mass, right 03/2008  . Contact dermatitis 01/31/2013  . COPD (chronic obstructive pulmonary disease) ([place])    "dx'd 12/2017"  . Coronary artery disease   . Heart murmur   . History of colonic polyps   . Hyperlipidemia   . Hypertension   . Low back pain   . Type II diabetes mellitus ([place])     Past Surgical History:  Procedure Laterality Date  . ABDOMINAL AORTOGRAM N/A 01/13/2018   Procedure: ABDOMINAL AORTOGRAM;  Surgeon: Troy Sine, MD;  Location: [facility];  Service: Cardiovascular;  Laterality: N/A;  . AORTIC VALVE REPLACEMENT N/A 03/04/2018   Procedure: AORTIC VALVE REPLACEMENT (AVR) 44mm Edwards Magna Ease Tissue Valve.;  Surgeon: Melrose Nakayama, MD;  Location: [facility];  Service: Open Heart Surgery;  Laterality: N/A;  . CARDIAC VALVE REPLACEMENT    . COLONOSCOPY W/  POLYPECTOMY    . CORONARY ARTERY BYPASS GRAFT N/A 03/04/2018   Procedure: CORONARY ARTERY BYPASS GRAFTING (CABG) x1:  LIMA to LAD.;  Surgeon: Melrose Nakayama, MD;  Location: [facility];  Service: Open Heart Surgery;  Laterality: N/A;  . LIPOMA EXCISION Left 07/2008    "back of shoulder" Dr Gershon Crane  . RIGHT/LEFT HEART CATH AND CORONARY ANGIOGRAPHY N/A 01/13/2018   Procedure: RIGHT/LEFT HEART CATH AND CORONARY ANGIOGRAPHY;  Surgeon: Troy Sine, MD;  Location: [facility];  Service: Cardiovascular;  Laterality: N/A;  . TEE WITHOUT CARDIOVERSION N/A 03/04/2018   Procedure: TRANSESOPHAGEAL ECHOCARDIOGRAM (TEE);  Surgeon: Melrose Nakayama, MD;  Location: [facility];  Service: Open Heart Surgery;  Laterality: N/A;  . TONSILLECTOMY    . WISDOM TOOTH EXTRACTION      Current Medications: Current Meds  Medication Sig  . aspirin EC 325 MG EC tablet Take 1 tablet (325 mg total) by mouth daily.  . Cyanocobalamin (VITAMIN B-12 PO) Take 1 tablet by mouth daily.  . dapagliflozin propanediol (FARXIGA) 5 MG TABS tablet Take 5 mg by mouth daily.  . furosemide (LASIX) 20 MG tablet TAKE 1 TABLET BY MOUTH EVERY DAY  . glucose blood (PRODIGY NO CODING BLOOD GLUC) test strip Use as instructed to check blood sugar twice a day.  DX  E11.40  . insulin aspart (NOVOLOG FLEXPEN) 100 UNIT/ML FlexPen INJECT 11-15 SUBCUTANEOUSLY DAILY BEFORE SUPPER (Patient taking differently: Inject 11 Units into the skin daily before  supper. )  . insulin detemir (LEVEMIR) 100 unit/ml SOLN Inject 0.4 mLs (40 Units total) into the skin daily.  . Insulin Pen Needle (B-D ULTRAFINE III SHORT PEN) 31G X 8 MM MISC USE AS DIRECTED  . metFORMIN (GLUCOPHAGE-XR) 500 MG 24 hr tablet Take 2 tablets (1,000 mg total) by mouth 2 (two) times daily.  . metoprolol tartrate (LOPRESSOR) 50 MG tablet Take 1 tablet (50 mg total) by mouth 2 (two) times daily.  . rosuvastatin (CRESTOR) 20 MG tablet TAKE 1 TABLET BY MOUTH EVERY DAY     Allergies:    Amlodipine besylate; Hydrochlorothiazide; and Lisinopril   Social History   Socioeconomic History  . Marital status: Married    Spouse name: Not on file  . Number of children: Not on file  . Years of education: Not on file  . Highest education level: Not on file  Occupational History  . Not on file  Social Needs  . Financial resource strain: Not on file  . Food insecurity:    Worry: Not on file    Inability: Not on file  . Transportation needs:    Medical: Not on file    Non-medical: Not on file  Tobacco Use  . Smoking status: Former Smoker    Packs/day: 1.00    Years: 33.00    Pack years: 33.00    Types: Cigarettes  . Smokeless tobacco: Never Used  . Tobacco comment: "quit in ~ 2007"  Substance and Sexual Activity  . Alcohol use: Yes    Alcohol/week: 3.0 standard drinks    Types: 3 Glasses of wine per week  . Drug use: Not Currently  . Sexual activity: Not Currently  Lifestyle  . Physical activity:    Days per week: Not on file    Minutes per session: Not on file  . Stress: Not on file  Relationships  . Social connections:    Talks on phone: Not on file    Gets together: Not on file    Attends religious service: Not on file    Active member of club or organization: Not on file    Attends meetings of clubs or organizations: Not on file    Relationship status: Not on file  Other Topics Concern  . 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 There is no history of Colon cancer. ROS:   Please see the history of present illness.    All 14 point review of systems negative except as described per history of present illness  EKGs/Labs/Other Studies  Reviewed:      Recent Labs: 03/08/2018: Hemoglobin 11.0; Magnesium 2.0; Platelets 361 06/10/2018: ALT 7; BUN 13; Creatinine, Ser 0.93; Potassium 5.2; Sodium 140; TSH 1.55  Recent Lipid Panel    Component Value Date/Time   CHOL 133 06/10/2018 1059   TRIG 63.0 06/10/2018 1059   HDL 47.80 06/10/2018 1059   CHOLHDL 3 06/10/2018 1059   VLDL 12.6 06/10/2018 1059   LDLCALC 72 06/10/2018 1059   LDLDIRECT 177.6 10/07/2007 1001    Physical Exam:    VS:  BP 120/80 (  BP Location: Right Arm, Patient Position: Sitting)   Pulse 78   Resp 16   Ht 6\' 4"  (1.93 m)   Wt 178 lb (80.7 kg)   SpO2 93%   BMI 21.67 kg/m     Wt Readings from Last 3 Encounters:  07/20/18 178 lb (80.7 kg)  06/10/18 176 lb (79.8 kg)  04/30/18 179 lb 3.7 oz (81.3 kg)     GEN:  Well nourished, well developed in no acute distress HEENT: Normal NECK: No JVD; No carotid bruits LYMPHATICS: No lymphadenopathy CARDIAC: RRR, no murmurs, no rubs, no gallops RESPIRATORY:  Clear to auscultation without rales, wheezing or rhonchi  ABDOMEN: Soft, non-tender, non-distended MUSCULOSKELETAL:  No edema; No deformity  SKIN: Warm and dry LOWER EXTREMITIES: no swelling NEUROLOGIC:  Alert and oriented x 3 PSYCHIATRIC:  Normal affect   ASSESSMENT:    1. Coronary artery disease involving native coronary artery of native heart without angina pectoris   2. S/P AVR   3. Mixed hyperlipidemia   4. Dilated cardiomyopathy ([place])    PLAN:    In order of problems listed above:  1. Coronary artery disease stable LIMA to LAD done during the aortic valve replacement asymptomatic. 2. Status post aortic valve replacement last echocardiogram showed normal function valve 3. Cardiomyopathy with diminished ejection fraction.  We will check his Chem-7 based on that we will decide if he can try to initiate small dose of ACE inhibitor or ARB if not we will put him back on hydralazine. 4. Dyslipidemia on Crestor which I will  continue.   Medication Adjustments/Labs and Tests Ordered: Current medicines are reviewed at length with the patient today.  Concerns regarding medicines are outlined above.  No orders of the defined types were placed in this encounter.  Medication changes: No orders of the defined types were placed in this encounter.   Signed, Park Liter, MD, [facility] 07/20/2018 1:39 PM    [facility]

## 2018-07-21 LAB — BASIC METABOLIC PANEL
BUN / CREAT RATIO: 17 (ref 10–24)
BUN: 16 mg/dL (ref 8–27)
CHLORIDE: 99 mmol/L (ref 96–106)
CO2: 23 mmol/L (ref 20–29)
Calcium: 9.5 mg/dL (ref 8.6–10.2)
Creatinine, Ser: 0.92 mg/dL (ref 0.76–1.27)
GFR calc Af Amer: 100 mL/min/{1.73_m2} (ref >59)
GFR calc non Af Amer: 86 mL/min/{1.73_m2} (ref >59)
GLUCOSE: 152 mg/dL — AB (ref 65–99)
Potassium: 4.6 mmol/L (ref 3.5–5.2)
SODIUM: 139 mmol/L (ref 134–144)

## 2018-07-22 ENCOUNTER — Encounter (HOSPITAL_COMMUNITY): Payer: Medicare Other

## 2018-07-22 ENCOUNTER — Encounter (HOSPITAL_COMMUNITY)
Admission: RE | Admit: 2018-07-22 | Discharge: 2018-07-22 | Disposition: A | Discharge status: Home / Self Care | Payer: Medicare Other | Source: Ambulatory Visit | Attending: Cardiology | Admitting: Cardiology

## 2018-07-22 ENCOUNTER — Telehealth: Payer: Self-pay | Admitting: Emergency Medicine

## 2018-07-22 DIAGNOSIS — Z952 Presence of prosthetic heart valve: Secondary | ICD-10-CM

## 2018-07-22 DIAGNOSIS — Z951 Presence of aortocoronary bypass graft: Secondary | ICD-10-CM

## 2018-07-22 DIAGNOSIS — Z7982 Long term (current) use of aspirin: Secondary | ICD-10-CM | POA: Diagnosis not present

## 2018-07-22 DIAGNOSIS — I2510 Atherosclerotic heart disease of native coronary artery without angina pectoris: Secondary | ICD-10-CM | POA: Diagnosis not present

## 2018-07-22 DIAGNOSIS — I10 Essential (primary) hypertension: Secondary | ICD-10-CM | POA: Diagnosis not present

## 2018-07-22 DIAGNOSIS — Z87891 Personal history of nicotine dependence: Secondary | ICD-10-CM | POA: Diagnosis not present

## 2018-07-22 MED ORDER — HYDRALAZINE HCL 10 MG PO TABS: 10.0000 mg | ORAL_TABLET | Freq: Three times a day (TID) | ORAL | 1 refills | Status: DC

## 2018-07-22 NOTE — Telephone Encounter (Signed)
Informed patient's wife of patient's test results. Per Dr. Agustin Cree patient will start hydralazine 10 mg three times daily. Patient's wife verbally understands.

## 2018-07-22 NOTE — Addendum Note (Signed)
Encounter addended by: Sol Passer on: 07/22/2018 12:15 PM  Actions taken: Visit Navigator Flowsheet section accepted

## 2018-07-24 ENCOUNTER — Encounter (HOSPITAL_COMMUNITY): Payer: Medicare Other

## 2018-07-27 ENCOUNTER — Encounter (HOSPITAL_COMMUNITY)
Admission: RE | Admit: 2018-07-27 | Discharge: 2018-07-27 | Disposition: A | Discharge status: Home / Self Care | Payer: Medicare Other | Source: Ambulatory Visit | Attending: Cardiology | Admitting: Cardiology

## 2018-07-27 ENCOUNTER — Encounter (HOSPITAL_COMMUNITY): Payer: Medicare Other

## 2018-07-27 DIAGNOSIS — Z87891 Personal history of nicotine dependence: Secondary | ICD-10-CM | POA: Insufficient documentation

## 2018-07-27 DIAGNOSIS — Z951 Presence of aortocoronary bypass graft: Secondary | ICD-10-CM | POA: Diagnosis not present

## 2018-07-27 DIAGNOSIS — E785 Hyperlipidemia, unspecified: Secondary | ICD-10-CM | POA: Insufficient documentation

## 2018-07-27 DIAGNOSIS — Z794 Long term (current) use of insulin: Secondary | ICD-10-CM | POA: Insufficient documentation

## 2018-07-27 DIAGNOSIS — J449 Chronic obstructive pulmonary disease, unspecified: Secondary | ICD-10-CM | POA: Diagnosis not present

## 2018-07-27 DIAGNOSIS — Z952 Presence of prosthetic heart valve: Secondary | ICD-10-CM

## 2018-07-27 DIAGNOSIS — I2510 Atherosclerotic heart disease of native coronary artery without angina pectoris: Secondary | ICD-10-CM | POA: Diagnosis not present

## 2018-07-27 DIAGNOSIS — I10 Essential (primary) hypertension: Secondary | ICD-10-CM | POA: Diagnosis not present

## 2018-07-27 DIAGNOSIS — Z79899 Other long term (current) drug therapy: Secondary | ICD-10-CM | POA: Insufficient documentation

## 2018-07-27 DIAGNOSIS — E119 Type 2 diabetes mellitus without complications: Secondary | ICD-10-CM | POA: Diagnosis not present

## 2018-07-27 DIAGNOSIS — Z86010 Personal history of colonic polyps: Secondary | ICD-10-CM | POA: Insufficient documentation

## 2018-07-27 DIAGNOSIS — Z7982 Long term (current) use of aspirin: Secondary | ICD-10-CM | POA: Insufficient documentation

## 2018-07-29 ENCOUNTER — Encounter (HOSPITAL_COMMUNITY): Payer: Medicare Other

## 2018-07-29 ENCOUNTER — Encounter (HOSPITAL_COMMUNITY)
Admission: RE | Admit: 2018-07-29 | Discharge: 2018-07-29 | Disposition: A | Discharge status: Home / Self Care | Payer: Medicare Other | Source: Ambulatory Visit | Attending: Cardiology | Admitting: Cardiology

## 2018-07-29 ENCOUNTER — Telehealth: Payer: Self-pay | Admitting: Emergency Medicine

## 2018-07-29 DIAGNOSIS — Z952 Presence of prosthetic heart valve: Secondary | ICD-10-CM | POA: Diagnosis not present

## 2018-07-29 DIAGNOSIS — I2510 Atherosclerotic heart disease of native coronary artery without angina pectoris: Secondary | ICD-10-CM | POA: Diagnosis not present

## 2018-07-29 DIAGNOSIS — Z951 Presence of aortocoronary bypass graft: Secondary | ICD-10-CM | POA: Diagnosis not present

## 2018-07-29 DIAGNOSIS — Z7982 Long term (current) use of aspirin: Secondary | ICD-10-CM | POA: Diagnosis not present

## 2018-07-29 DIAGNOSIS — I10 Essential (primary) hypertension: Secondary | ICD-10-CM | POA: Diagnosis not present

## 2018-07-29 DIAGNOSIS — Z87891 Personal history of nicotine dependence: Secondary | ICD-10-CM | POA: Diagnosis not present

## 2018-07-29 DIAGNOSIS — I493 Ventricular premature depolarization: Secondary | ICD-10-CM

## 2018-07-29 NOTE — Telephone Encounter (Signed)
Cardiac rehab calling to let us know they will be sending ekg to [place] today for Dr. Agustin Cree to review.

## 2018-07-30 ENCOUNTER — Ambulatory Visit: Payer: Medicare Other

## 2018-07-30 DIAGNOSIS — I493 Ventricular premature depolarization: Secondary | ICD-10-CM | POA: Diagnosis not present

## 2018-07-30 NOTE — Progress Notes (Signed)
Increased intermittent PVC's noted yesterday during exercise. Vital signs stable. [name] admitted to drinking coffee later than usual. Patient asymptomatic. Dr Wendy Poet office called and notified.  [facility] faxed to Dr Orpah Clinton office for review. Will continue to monitor the patient throughout  the program. [name] will graduate from cardiac rehab next week.[name], RN,BSN 07/30/2018 10:20 AM

## 2018-07-30 NOTE — Addendum Note (Signed)
Addended by: Ashok Norris on: 07/30/2018 03:52 PM   Modules accepted: Orders

## 2018-07-30 NOTE — Telephone Encounter (Signed)
Informed wife that per Dr.Munley he doesn't think hydralazine has caused the issue with pvcs. She will discuss with patient and let us know if they decide for him to continue taking or not and call to let us know tomorrow.

## 2018-07-30 NOTE — Telephone Encounter (Signed)
Ekg from cardiac rehab has been received. Dr. Bettina Gavia reviewed this and has advised for patient to wear a 3 day zio patch monitor and have lab work drawn today in [place] office. Patient's wife informed of this. She was frustrated that this wasn't handled sooner. I explained the fax from cardiac rehab was never received and we had to call them back and ask for it  to be resent. Also informed her Dr. Agustin Cree is out of the office and Dr. Bettina Gavia had to review this between clinic patients today. Apologized for the delay. Patient's wife also informed me that her and the patient were concerned about new medication that was recently started hydralazine 10 mg 3 times daily, and thinking this may have caused his pvc's. Patient hasn't taken today due to this concern will inform Dr. Bettina Gavia and get back with patient's wife on recommendation.

## 2018-07-30 NOTE — Telephone Encounter (Signed)
Patient's wife called that [employer] office and spoke with Langley Gauss about an arrhthymias the patient has since starting a new medication. I advised that cardiac rehab had called yesterday and was supposed to fax an EKG. Langley Gauss advised the patient that we would be reviewing this EKG and giving her a call back.   I reached out to cardiac rehab to have them refax the EKG as we have not received the EKG and it is not in the chart.

## 2018-07-31 ENCOUNTER — Encounter (HOSPITAL_COMMUNITY): Payer: Medicare Other

## 2018-07-31 ENCOUNTER — Encounter (HOSPITAL_COMMUNITY)
Admission: RE | Admit: 2018-07-31 | Discharge: 2018-07-31 | Disposition: A | Discharge status: Home / Self Care | Payer: Medicare Other | Source: Ambulatory Visit | Attending: Cardiology | Admitting: Cardiology

## 2018-07-31 DIAGNOSIS — Z87891 Personal history of nicotine dependence: Secondary | ICD-10-CM | POA: Diagnosis not present

## 2018-07-31 DIAGNOSIS — Z951 Presence of aortocoronary bypass graft: Secondary | ICD-10-CM | POA: Diagnosis not present

## 2018-07-31 DIAGNOSIS — Z952 Presence of prosthetic heart valve: Secondary | ICD-10-CM

## 2018-07-31 DIAGNOSIS — I10 Essential (primary) hypertension: Secondary | ICD-10-CM | POA: Diagnosis not present

## 2018-07-31 DIAGNOSIS — I2510 Atherosclerotic heart disease of native coronary artery without angina pectoris: Secondary | ICD-10-CM | POA: Diagnosis not present

## 2018-07-31 DIAGNOSIS — Z7982 Long term (current) use of aspirin: Secondary | ICD-10-CM | POA: Diagnosis not present

## 2018-07-31 LAB — BASIC METABOLIC PANEL
BUN/Creatinine Ratio: 15 (ref 10–24)
BUN: 15 mg/dL (ref 8–27)
CALCIUM: 9.9 mg/dL (ref 8.6–10.2)
CHLORIDE: 105 mmol/L (ref 96–106)
CO2: 23 mmol/L (ref 20–29)
Creatinine, Ser: 0.97 mg/dL (ref 0.76–1.27)
GFR calc non Af Amer: 81 mL/min/{1.73_m2} (ref >59)
GFR, EST AFRICAN AMERICAN: 94 mL/min/{1.73_m2} (ref >59)
GLUCOSE: 173 mg/dL — AB (ref 65–99)
POTASSIUM: 4.7 mmol/L (ref 3.5–5.2)
Sodium: 145 mmol/L — ABNORMAL HIGH (ref 134–144)

## 2018-07-31 LAB — MAGNESIUM: MAGNESIUM: 1.8 mg/dL (ref 1.6–2.3)

## 2018-08-03 ENCOUNTER — Encounter (HOSPITAL_COMMUNITY): Payer: Medicare Other

## 2018-08-03 ENCOUNTER — Encounter (HOSPITAL_COMMUNITY)
Admission: RE | Admit: 2018-08-03 | Discharge: 2018-08-03 | Disposition: A | Discharge status: Home / Self Care | Payer: Medicare Other | Source: Ambulatory Visit | Attending: Cardiology | Admitting: Cardiology

## 2018-08-03 ENCOUNTER — Other Ambulatory Visit: Payer: Self-pay | Admitting: Family

## 2018-08-03 DIAGNOSIS — Z952 Presence of prosthetic heart valve: Secondary | ICD-10-CM

## 2018-08-03 DIAGNOSIS — Z951 Presence of aortocoronary bypass graft: Secondary | ICD-10-CM

## 2018-08-03 DIAGNOSIS — Z7982 Long term (current) use of aspirin: Secondary | ICD-10-CM | POA: Diagnosis not present

## 2018-08-03 DIAGNOSIS — I2510 Atherosclerotic heart disease of native coronary artery without angina pectoris: Secondary | ICD-10-CM | POA: Diagnosis not present

## 2018-08-03 DIAGNOSIS — I10 Essential (primary) hypertension: Secondary | ICD-10-CM | POA: Diagnosis not present

## 2018-08-03 DIAGNOSIS — Z87891 Personal history of nicotine dependence: Secondary | ICD-10-CM | POA: Diagnosis not present

## 2018-08-05 ENCOUNTER — Encounter (HOSPITAL_COMMUNITY): Payer: Medicare Other

## 2018-08-05 ENCOUNTER — Encounter (HOSPITAL_COMMUNITY)
Admission: RE | Admit: 2018-08-05 | Discharge: 2018-08-05 | Disposition: A | Discharge status: Home / Self Care | Payer: Medicare Other | Source: Ambulatory Visit | Attending: Cardiology | Admitting: Cardiology

## 2018-08-05 VITALS — BP 120/80 | HR 80 | Ht 74.5 in | Wt 175.7 lb

## 2018-08-05 DIAGNOSIS — Z951 Presence of aortocoronary bypass graft: Secondary | ICD-10-CM

## 2018-08-05 DIAGNOSIS — I2510 Atherosclerotic heart disease of native coronary artery without angina pectoris: Secondary | ICD-10-CM | POA: Diagnosis not present

## 2018-08-05 DIAGNOSIS — Z952 Presence of prosthetic heart valve: Secondary | ICD-10-CM

## 2018-08-05 DIAGNOSIS — Z87891 Personal history of nicotine dependence: Secondary | ICD-10-CM | POA: Diagnosis not present

## 2018-08-05 DIAGNOSIS — I10 Essential (primary) hypertension: Secondary | ICD-10-CM | POA: Diagnosis not present

## 2018-08-05 DIAGNOSIS — Z7982 Long term (current) use of aspirin: Secondary | ICD-10-CM | POA: Diagnosis not present

## 2018-08-05 NOTE — Progress Notes (Signed)
Discharge Progress Report  Patient Details  Name: [name] MRN: [id number] Date of Birth: September 10, 1950 Referring Provider:     CARDIAC REHAB PHASE II ORIENTATION from 04/30/2018 in [place]  Referring Provider  [name]        Number of Visits: 36  Reason for Discharge:  Patient reached a stable level of exercise. Patient independent in their exercise. Patient has met program and personal goals.  Smoking History:  Social History   Tobacco Use  Smoking Status Former Smoker  . Packs/day: 1.00  . Years: 33.00  . Pack years: 33.00  . Types: Cigarettes  Smokeless Tobacco Never Used  Tobacco Comment   "quit in ~ 2007"    Diagnosis:  S/P AVR  S/P CABG (coronary artery bypass graft)  ADL [facility]:   Initial Exercise Prescription: Initial Exercise Prescription - 04/30/18 1100      Date of Initial Exercise RX and Referring Provider   Date  04/30/18    Referring Provider  [name]     Expected Discharge Date  07/30/18      Recumbant Bike   Level  2    Watts  50    Minutes  10    METs  3.97      NuStep   Level  2    SPM  75    Minutes  10    METs  2.7      Track   Laps  14    Minutes  10    METs  3.45      Prescription Details   Frequency (times per week)  3x    Duration  Progress to 30 minutes of continuous aerobic without signs/symptoms of physical distress      Intensity   THRR 40-80% of Max Heartrate  69-138    Ratings of Perceived Exertion  11-13    Perceived Dyspnea  0-4      Progression   Progression  Continue progressive overload as per policy without signs/symptoms or physical distress.      Resistance Training   Training Prescription  Yes    Weight  3lbs    Reps  10-15       Discharge Exercise Prescription (Final Exercise Prescription Changes): Exercise Prescription Changes - 08/05/18 0954      Response to Exercise   Blood Pressure (Admit)  120/80    Blood Pressure  (Exercise)  142/68    Blood Pressure (Exit)  118/82    Heart Rate (Admit)  80 bpm    Heart Rate (Exercise)  102 bpm    Heart Rate (Exit)  80 bpm    Rating of Perceived Exertion (Exercise)  11    Symptoms  none    Duration  Progress to 30 minutes of  aerobic without signs/symptoms of physical distress    Intensity  THRR unchanged      Progression   Progression  Continue to progress workloads to maintain intensity without signs/symptoms of physical distress.    Average METs  3.4      Resistance Training   Training Prescription  No   Relaxation day, no weights.     Interval Training   Interval Training  No      Recumbant Bike   Level  6    Minutes  10    METs  4.7      NuStep   Level  5    SPM  75  Minutes  10    METs  2.7      Track   Laps  10    Minutes  10    METs  2.76      Home Exercise Plan   Plans to continue exercise at  Home (comment)    Frequency  Add 2 additional days to program exercise sessions.    Initial Home Exercises Provided  05/15/18       Functional Capacity: 6 Minute Walk    Row Name 04/30/18 1132 07/22/18 1012       6 Minute Walk   Phase  Initial  Discharge    Distance  1600 feet  1934 feet    Distance % Change  -  20.88 %    Distance Feet Change  -  334 ft    Walk Time  6 minutes  6 minutes    # of Rest Breaks  0  0    MPH  3.03  3.66    METS  4.29  5.02    RPE  12  11    Perceived Dyspnea   0  0    VO2 Peak  14.99  17.58    Symptoms  No  No    Resting HR  79 bpm  87 bpm    Resting BP  132/78  104/66    Resting Oxygen Saturation   96 %  -    Exercise Oxygen Saturation  during 6 min walk  96 %  -    Max Ex. HR  110 bpm  109 bpm    Max Ex. BP  142/82  158/84    2 Minute Post BP  142/80  112/70       Psychological, QOL, Others - Outcomes: PHQ 2/9: Depression screen [facility] 2/9 08/05/2018 05/06/2018 03/12/2018 02/05/2017 05/07/2013  Decreased Interest 0 0 0 1 0  Down, Depressed, Hopeless 0 0 0 0 0  PHQ - 2 Score 0 0 0 1 0   Altered sleeping - - 0 1 -  Tired, decreased energy - - 0 0 -  Change in appetite - - 1 0 -  Feeling bad or failure about yourself  - - 0 0 -  Trouble concentrating - - 0 0 -  Moving slowly or fidgety/restless - - 0 0 -  Suicidal thoughts - - 0 0 -  PHQ-9 Score - - 1 2 -  Some recent data might be hidden    Quality of Life: Quality of Life - 05/15/18 1600      Quality of Life   Select  Quality of Life      Quality of Life Scores   Health/Function Pre  25.23 %    Socioeconomic Pre  27.93 %    Psych/Spiritual Pre  29.29 %    Family Pre  27 %    GLOBAL Pre  26.88 %       Personal Goals: Goals established at orientation with interventions provided to work toward goal. Personal Goals and Risk Factors at Admission - 04/30/18 1147      Core Components/Risk Factors/Patient Goals on Admission    Weight Management  Yes;Weight Maintenance    Intervention  Weight Management: Develop a combined nutrition and exercise program designed to reach desired caloric intake, while maintaining appropriate intake of nutrient and fiber, sodium and fats, and appropriate energy expenditure required for the weight goal.;Weight Management: Provide education and appropriate resources to help participant work on and attain  dietary goals.;Weight Management/Obesity: Establish reasonable short term and long term weight goals.    Admit Weight  179 lb 3.7 oz (81.3 kg)    Expected Outcomes  Long Term: Adherence to nutrition and physical activity/exercise program aimed toward attainment of established weight goal;Short Term: Continue to assess and modify interventions until short term weight is achieved;Understanding recommendations for meals to include 15-35% energy as protein, 25-35% energy from fat, 35-60% energy from carbohydrates, less than 271m of dietary cholesterol, 20-35 gm of total fiber daily;Understanding of distribution of calorie intake throughout the day with the consumption of 4-5 meals/snacks;Weight  Maintenance: Understanding of the daily nutrition guidelines, which includes 25-35% calories from fat, 7% or less cal from saturated fats, less than 2097mcholesterol, less than 1.5gm of sodium, & 5 or more servings of fruits and vegetables daily    Improve shortness of breath with ADL's  Yes    Intervention  Provide education, individualized exercise plan and daily activity instruction to help decrease symptoms of SOB with activities of daily living.    Expected Outcomes  Short Term: Improve cardiorespiratory fitness to achieve a reduction of symptoms when performing ADLs;Long Term: Be able to perform more ADLs without symptoms or delay the onset of symptoms    Diabetes  Yes    Intervention  Provide education about signs/symptoms and action to take for hypo/hyperglycemia.;Provide education about proper nutrition, including hydration, and aerobic/resistive exercise prescription along with prescribed medications to achieve blood glucose in normal ranges: Fasting glucose 65-99 mg/dL    Expected Outcomes  Long Term: Attainment of HbA1C < 7%.;Short Term: Participant verbalizes understanding of the signs/symptoms and immediate care of hyper/hypoglycemia, proper foot care and importance of medication, aerobic/resistive exercise and nutrition plan for blood glucose control.    Hypertension  Yes    Intervention  Monitor prescription use compliance.;Provide education on lifestyle modifcations including regular physical activity/exercise, weight management, moderate sodium restriction and increased consumption of fresh fruit, vegetables, and low fat dairy, alcohol moderation, and smoking cessation.    Expected Outcomes  Short Term: Continued assessment and intervention until BP is < 140/9067mG in hypertensive participants. < 130/39m59m in hypertensive participants with diabetes, heart failure or chronic kidney disease.;Long Term: Maintenance of blood pressure at goal levels.    Lipids  Yes    Intervention   Provide education and support for participant on nutrition & aerobic/resistive exercise along with prescribed medications to achieve LDL <70mg32mL >40mg.43mExpected Outcomes  Short Term: Participant states understanding of desired cholesterol values and is compliant with medications prescribed. Participant is following exercise prescription and nutrition guidelines.;Long Term: Cholesterol controlled with medications as prescribed, with individualized exercise RX and with personalized nutrition plan. Value goals: LDL < 70mg, 67m> 40 mg.        Personal Goals Discharge: Goals and Risk Factor Review    Row Name 05/28/18 1112 06/25/18 1114 07/15/18 1351 08/05/18 1150       Core Components/Risk Factors/Patient Goals Review   Personal Goals Review  Weight Management/Obesity;Hypertension;Diabetes;Lipids  Weight Management/Obesity;Hypertension;Diabetes;Lipids  Weight Management/Obesity;Hypertension;Diabetes;Lipids  Weight Management/Obesity;Hypertension;Diabetes;Lipids    Review  [name]'s vital signs  and CBG'shave been stable at cardiac rehab. [name] has lost about 2 pounds since starting exercise. Will continue to monitor weight.  [name] vital signs  and CBG'shave been stable at cardiac rehab. [name] weight has remained at 78 kg. Will continue to monitor weight.  [name] vital signs  and CBG'shave been stable at cardiac rehab. [name] weight has remained  at 78 kg. Will continue to monitor weight.  Basem's vital signs  and CBG'shave been stable at cardiac rehab. [name] graduates from cardiac rehab today.    Expected Outcomes  Patient will continue to participate in phase 2 cardiac rehab for exercise, nutrition and lifestyle modification opportunites.  Patient will continue to participate in phase 2 cardiac rehab for exercise, nutrition and lifestyle modification opportunites.  Patient will continue to participate in phase 2 cardiac rehab for exercise, nutrition and lifestyle modification  opportunites.  [name] will [facility], follow, nutrition and lifestyle modification opportunites upon graduation from phase 2 cardiac rehab       Exercise Goals and Review: Exercise Goals    Row Name 04/30/18 1133             Exercise Goals   Increase Physical Activity  Yes       Intervention  Provide advice, education, support and counseling about physical activity/exercise needs.;Develop an individualized exercise prescription for aerobic and resistive training based on initial evaluation findings, risk stratification, comorbidities and participant's personal goals.       Expected Outcomes  Short Term: Attend rehab on a regular basis to increase amount of physical activity.;Long Term: Add in home exercise to make exercise part of routine and to increase amount of physical activity.;Long Term: Exercising regularly at least 3-5 days a week.       Increase Strength and Stamina  Yes       Intervention  Provide advice, education, support and counseling about physical activity/exercise needs.;Develop an individualized exercise prescription for aerobic and resistive training based on initial evaluation findings, risk stratification, comorbidities and participant's personal goals.       Expected Outcomes  Short Term: Increase workloads from initial exercise prescription for resistance, speed, and METs.;Short Term: Perform resistance training exercises routinely during rehab and add in resistance training at home;Long Term: Improve cardiorespiratory fitness, muscular endurance and strength as measured by increased METs and functional capacity (6MWT)       Able to understand and use rate of perceived exertion (RPE) scale  Yes       Intervention  Provide education and explanation on how to use RPE scale       Expected Outcomes  Short Term: Able to use RPE daily in rehab to express subjective intensity level;Long Term:  Able to use RPE to guide intensity level when exercising  independently       Able to understand and use Dyspnea scale  Yes       Intervention  Provide education and explanation on how to use Dyspnea scale       Expected Outcomes  Short Term: Able to use Dyspnea scale daily in rehab to express subjective sense of shortness of breath during exertion;Long Term: Able to use Dyspnea scale to guide intensity level when exercising independently       Knowledge and understanding of Target Heart Rate Range (THRR)  Yes       Intervention  Provide education and explanation of THRR including how the numbers were predicted and where they are located for reference       Expected Outcomes  Short Term: Able to state/look up THRR;Long Term: Able to use THRR to govern intensity when exercising independently;Short Term: Able to use daily as guideline for intensity in rehab       Able to check pulse independently  Yes       Intervention  Provide education and demonstration on how to  check pulse in carotid and radial arteries.;Review the importance of being able to check your own pulse for safety during independent exercise       Expected Outcomes  Short Term: Able to explain why pulse checking is important during independent exercise;Long Term: Able to check pulse independently and accurately       Understanding of Exercise Prescription  Yes       Intervention  Provide education, explanation, and written materials on patient's individual exercise prescription       Expected Outcomes  Short Term: Able to explain program exercise prescription;Long Term: Able to explain home exercise prescription to exercise independently          Exercise Goals Re-Evaluation: Exercise Goals Re-Evaluation    Row Name 05/06/18 1043 05/15/18 1010 05/20/18 1013 06/17/18 1000 07/13/18 1010     Exercise Goal Re-Evaluation   Exercise Goals Review  Able to understand and use rate of perceived exertion (RPE) scale  Able to understand and use rate of perceived exertion (RPE) scale;Understanding of  Exercise Prescription;Increase Physical Activity;Knowledge and understanding of Target Heart Rate Range (THRR)  Able to understand and use rate of perceived exertion (RPE) scale;Understanding of Exercise Prescription;Increase Physical Activity;Knowledge and understanding of Target Heart Rate Range (THRR)  Able to understand and use rate of perceived exertion (RPE) scale;Understanding of Exercise Prescription;Increase Physical Activity;Knowledge and understanding of Target Heart Rate Range (THRR)  Able to understand and use rate of perceived exertion (RPE) scale;Understanding of Exercise Prescription;Increase Physical Activity;Knowledge and understanding of Target Heart Rate Range (THRR);Increase Strength and Stamina   Comments  Patient able to understand and use RPE scale appropriately.  Reviewed home exercise guidelines with patient including THRR, RPE scale, and endpoints for exercise. Pt plans to walk as his mode of home exercise.   Patient is walking at home in addition to exercise at cardiac rehab.  Patient is walking 15 minutes at least 4 days/week and using his step maching 15 minutes at least 3 days/week.  Patient's weight is staying consistent. Patient is progressing with increasing strength and stamina.   Expected Outcomes  Increase workloads as tolerated to improve cardiorespiratory fitness and strength.  Patient will walk 30 minutes at least 2 days/week in addition to exercise at cardiac rehab to help achieve persoanl health and fitness goals.  Patient will walk at least 30 minutes 2 days/week as his mode of home exercise.  Progress workloads to help achieve health and fitness goals.  Patient will increase hand weights from 4 to 5lbs to help increase strength.    [name] Name 07/22/18 1030 08/05/18 1100           Exercise Goal Re-Evaluation   Exercise Goals Review  Able to understand and use rate of perceived exertion (RPE) scale;Understanding of Exercise Prescription;Increase Physical  Activity;Knowledge and understanding of Target Heart Rate Range (THRR);Increase Strength and Stamina  Able to understand and use rate of perceived exertion (RPE) scale;Understanding of Exercise Prescription;Increase Physical Activity;Knowledge and understanding of Target Heart Rate Range (THRR);Increase Strength and Stamina;Able to check pulse independently      Comments  Patient increased walk test distance 369f, improving functional capacity 21%. Patient states that his strength and stamina have improved. [facility] upon graduation from the cardiac rehab program. Pt plans to exercise 30 minutes, 4 days/week.  Patient completed the phase 2 cardiac rehab program and has progressed well. Reviewed exercise prescription with patient, and pt plans to exercise at  least 30 minutes 3-4 days/[facility]      Expected Outcomes  Patient will exercise 30 minutes at least 4 days/week to help maintain health and fitness gains.  Patient          Nutrition & Weight - Outcomes: Pre Biometrics - 04/30/18 1132      Pre Biometrics   Height  6' 2.5" (1.892 m)    Weight  81.3 kg    Waist Circumference  35 inches    Hip Circumference  37 inches    Waist to Hip Ratio  0.95 %    BMI (Calculated)  22.71    Triceps Skinfold  14 mm    % Body Fat  22.5 %    Grip Strength  39 kg    Flexibility  16.5 in    Single Leg Stand  21.19 seconds      Post Biometrics - 07/22/18 1020       Post  Biometrics   Waist Circumference  32.75 inches    Hip Circumference  38.75 inches    Waist to Hip Ratio  0.85 %    Triceps Skinfold  15 mm    Grip Strength  39.5 kg    Flexibility  17.5 in    Single Leg Stand  30 seconds       Nutrition: Nutrition Therapy & Goals - 04/30/18 0855      Nutrition Therapy   Diet  carb modified heart healthy      Personal Nutrition Goals   Nutrition Goal  Pt to identify and limit food sources of saturated fat, trans fat, refined  carbohydrates and sodium    Personal Goal #2  Pt able to name foods that affect blood glucose.    Personal Goal #3  Improved blood glucose control as evidenced by pt's A1c trending from 7.3 toward less than 7.0.      Intervention Plan   Intervention  Prescribe, educate and counsel regarding individualized specific dietary modifications aiming towards targeted core components such as weight, hypertension, lipid management, diabetes, heart failure and other comorbidities.    Expected Outcomes  Short Term Goal: Understand basic principles of dietary content, such as calories, fat, sodium, cholesterol and nutrients.       Nutrition Discharge: Nutrition Assessments - 08/03/18 1510      MEDFICTS Scores   Pre Score  32    Post Score  9    Score Difference  -23       Education Questionnaire Score: Knowledge Questionnaire Score - 05/15/18 1010      Knowledge Questionnaire Score   Pre Score  15/24   Did not finish quiz.      Goals reviewed with patient; copy given to patient. [name] graduated from cardiac rehab program today with completion of 36 exercise sessions in Phase II. Pt maintained good attendance and progressed nicely during his participation in rehab as evidenced by increased MET level.   Medication list reconciled. Repeat  PHQ score- 0 .  Pt has made significant lifestyle changes and should be commended for his success. Pt feels he has achieved his goals during cardiac rehab.   Pt plans to continue exercise at the [facility]. [name] increased his distance on his post exercise walk test. We are proud of [name] progress![name] Pall, RN,BSN 08/24/2018 4:26 PM

## 2018-08-06 DIAGNOSIS — I493 Ventricular premature depolarization: Secondary | ICD-10-CM | POA: Diagnosis not present

## 2018-08-07 ENCOUNTER — Encounter (HOSPITAL_COMMUNITY): Payer: Medicare Other

## 2018-08-13 ENCOUNTER — Other Ambulatory Visit: Payer: Self-pay | Admitting: Cardiology

## 2018-08-24 NOTE — Progress Notes (Addendum)
@Patient  ID: [name], male    DOB: 01-20-1951, 67 y.o.   MRN: [id number]  Chief Complaint  Patient presents with  . Follow-up    pt currently has a cold. reports of prod cough with clear mucus, occ wheezing, nasal/head congestion & nasal drainage clear in color x1w. started OTC tussin yesterday with mild relief.  wearing 2L O2 QHS. not wearing oxygen with exertion    Referring provider: Debbrah Alar, NP  HPI:  67 year old male former smoker initially referred to our office on 02/02/2018 for dyspnea.  Diagnosed with Gold II COPD, emphysema, Chronic respiratory failure requiring oxygen with exertion and at night.   PMH: Severe aortic stenosis, status post AVR, CAD, cardiomyopathy, dyslipidemia Smoker/ Smoking History: Former smoker.  41-pack-year smoking history.  Quit 2012. Maintenance:  none Pt of: Dr. Lake Bells  Recent  Pulmonary Encounters:   02/02/18 - IOV - Dr. Lake Bells  Patient referred today for dyspnea.  Patient was recently found to have severe aortic stenosis.  He sometimes will cough up phlegm.  He is noticed a cough over the years.  He quit smoking around 2012.  Prior to that he smoked 1 pack a day for 41 years.  Patient has worked Architect over the years.  Patient with known exposure to asbestos tiles, he wears a mask occasionally.  Early on with his career he did not wear a mask.  No significant history of pneumonia or bronchitis. Plan: PFT, High res ct, follow-up with cardiology and consider the heart surgery that they suggest, follow-up with our office after PFT and high-res CT  08/25/2018  - Visit   67 year old male former smoker presenting to our office today for follow-up visit.  Patient was informed by his DME company that he needs office visit to further evaluate oxygen needs.  Patient walked in office today and did not require oxygen.  Patient's oxygen saturations remained above 92%.  Patient will need to continue 2 L of oxygen at night as  this was prevented with an overnight oximetry test that he needed this.  Since last office visit patient is completed pulmonary function testing as well as a high-res CT.  I have reviewed these results with the patient.  High-res CT did show severe centrilobular emphysema.  Pulmonary function testing did show Gold 2 COPD.  Patient is not currently maintained on any maintenance inhalers.  Patient is currently asymptomatic.  MMRC - Breathlessness Score 0 - I will get breathless with strenuous exercise     Tests:  04/09/18 >>> ONO - telephone note>>> Patient's ONO on room air showed that he was less than 88% for more than an hour. Patient needs to be on 2 L at HS.  02/04/2018-high-res CT- no evidence of interstitial lung disease, severe centrilobular emphysema and mild diffuse bronchial wall thickening suggesting COPD, no calcified pleural plaques no pleural effusion, mild smooth bilateral pleural thickening which is nonspecific  05/21/2018-echocardiogram-LV ejection fraction 40 to 51%, grade 1 diastolic dysfunction  7/00/1749-SWHQPRFFM function test- FVC 3.37 (71% predicted), postbronchodilator ratio 50, postbronchodilator FEV1 54, mid flow reversibility after bronchodilator, DLCO 36 >>>Moderate airflow obstruction, diminished diffusion capacity  02/03/2018-home sleep study- AHI 6 an hour, SaO2 low 85%  Echo: 11/2017 TTE> Critical AS, LVEF 35-40%, mod AR, RVSP 41 mmHg  Heart Catheterization: 12/2017 LHC and RHC showed severe AS, non-obstructive multi-vessel CAD, PA mean 30  FENO:  No results found for: NITRICOXIDE  PFT: PFT Results Latest Ref Rng & Units 02/04/2018  FVC-Pre L 3.37  FVC-Predicted Pre % 71  FVC-Post L 3.88  FVC-Predicted Post % 82  Pre FEV1/FVC % % 53  Post FEV1/FCV % % 50  FEV1-Pre L 1.78  FEV1-Predicted Pre % 49  FEV1-Post L 1.96  DLCO UNC% % 36  DLCO COR %Predicted % 57  TLC L 7.36  TLC % Predicted % 91  RV % Predicted % 125    Imaging: No results  found.    Specialty Problems      Pulmonary Problems   Dyspnea on exertion   Centrilobular emphysema (HCC)    02/04/2018-high-res CT- no evidence of interstitial lung disease, severe centrilobular emphysema and mild diffuse bronchial wall thickening suggesting COPD, no calcified pleural plaques no pleural effusion, mild smooth bilateral pleural thickening which is nonspecific  02/04/2018-pulmonary function test- FVC 3.37 (71% predicted), postbronchodilator ratio 50, postbronchodilator FEV1 54, mid flow reversibility after bronchodilator, DLCO 36 >>>Moderate airflow obstruction, diminished diffusion capacity      COPD GOLD II A     02/04/2018-high-res CT- no evidence of interstitial lung disease, severe centrilobular emphysema and mild diffuse bronchial wall thickening suggesting COPD, no calcified pleural plaques no pleural effusion, mild smooth bilateral pleural thickening which is nonspecific  02/04/2018-pulmonary function test- FVC 3.37 (71% predicted), postbronchodilator ratio 50, postbronchodilator FEV1 54, mid flow reversibility after bronchodilator, DLCO 36 >>>Moderate airflow obstruction, diminished diffusion capacity      Nocturnal hypoxemia    04/09/18 >>> ONO - telephone note>>> Patient's ONO on room air showed that he was less than 88% for more than an hour. Patient needs to be on 2 L at HS.          Allergies  Allergen Reactions  . Amlodipine Besylate Other (See Comments)    REACTION: ? caused left axillary nodules/chest flutter  . Hydrochlorothiazide Hives  . Lisinopril     Hyperkalemia     Immunization History  Administered Date(s) Administered  . Influenza Split 08/04/2012  . Influenza Whole 05/20/2008, 07/12/2009, 05/02/2010  . Influenza, High Dose Seasonal PF 05/20/2017, 06/10/2018  . Influenza,inj,Quad PF,6+ Mos 05/07/2013, 05/09/2014, 06/07/2015, 05/01/2016  . Pneumococcal Conjugate-13 02/05/2017  . Pneumococcal Polysaccharide-23 05/20/2008, 08/25/2018   . Td 08/26/2008   >>> Pneumovax 23 to be given today  Past Medical History:  Diagnosis Date  . Abnormal myocardial perfusion study 2006   EF 44% ? inferoseptal ischemia. no cath done  . Breast mass, right 03/2008  . Contact dermatitis 01/31/2013  . COPD (chronic obstructive pulmonary disease) ([place])    "dx'd 12/2017"  . Coronary artery disease   . Heart murmur   . History of colonic polyps   . Hyperlipidemia   . Hypertension   . Low back pain   . Type II diabetes mellitus (HCC)     Tobacco History: Social History   Tobacco Use  Smoking Status Former Smoker  . Packs/day: 1.00  . Years: 41.00  . Pack years: 41.00  . Types: Cigarettes  . Last attempt to quit: 08/26/2010  . Years since quitting: 8.0  Smokeless Tobacco Never Used  Tobacco Comment   Quit 2012   Counseling given: Yes Comment: Quit 2012  Continue to not smoke.  Outpatient Encounter Medications as of 08/25/2018  Medication Sig  . aspirin EC 325 MG EC tablet Take 1 tablet (325 mg total) by mouth daily.  . Cyanocobalamin (VITAMIN B-12 PO) Take 1 tablet by mouth daily.  . dapagliflozin propanediol (FARXIGA) 5 MG TABS tablet Take 5 mg by mouth daily.  . furosemide (LASIX) 20  MG tablet TAKE 1 TABLET BY MOUTH EVERY DAY  . glucose blood (PRODIGY NO CODING BLOOD GLUC) test strip Use as instructed to check blood sugar twice a day.  DX  E11.40  . hydrALAZINE (APRESOLINE) 10 MG tablet TAKE 1 TABLET BY MOUTH THREE TIMES A DAY  . insulin aspart (NOVOLOG FLEXPEN) 100 UNIT/ML FlexPen INJECT 11-15 SUBCUTANEOUSLY DAILY BEFORE SUPPER (Patient taking differently: Inject 11 Units into the skin daily before supper. )  . insulin detemir (LEVEMIR) 100 unit/ml SOLN Inject 0.4 mLs (40 Units total) into the skin daily.  . Insulin Pen Needle (B-D ULTRAFINE III SHORT PEN) 31G X 8 MM MISC USE AS DIRECTED  . metFORMIN (GLUCOPHAGE-XR) 500 MG 24 hr tablet Take 2 tablets (1,000 mg total) by mouth 2 (two) times daily.  . metoprolol tartrate  (LOPRESSOR) 50 MG tablet TAKE 1 TABLET BY MOUTH TWICE A DAY  . rosuvastatin (CRESTOR) 20 MG tablet TAKE 1 TABLET BY MOUTH EVERY DAY   No facility-administered encounter medications on file as of 08/25/2018.      Review of Systems  Review of Systems  Constitutional: Positive for chills and fever (subjective fevers). Negative for activity change, fatigue and unexpected weight change.  HENT: Positive for congestion. Negative for postnasal drip, rhinorrhea, sneezing and sore throat.   Eyes: Negative.   Respiratory: Positive for cough (clear mucous - baseline), shortness of breath and wheezing (with exertion ). Negative for chest tightness.   Cardiovascular: Negative for chest pain and palpitations.  Gastrointestinal: Negative for constipation, diarrhea, nausea and vomiting.  Endocrine: Negative.   Musculoskeletal: Negative.   Skin: Negative.   Neurological: Negative for dizziness and headaches.  Psychiatric/Behavioral: Negative.  Negative for dysphoric mood. The patient is not nervous/anxious.   All other systems reviewed and are negative.    Physical Exam  BP 126/74 (BP Location: Left Arm, Cuff Size: Normal)   Pulse 73   Temp 98.4 F (36.9 C) (Oral)   Ht 6' 2.5" (1.892 m)   Wt 179 lb 9.6 oz (81.5 kg)   SpO2 95%   BMI 22.75 kg/m   Wt Readings from Last 5 Encounters:  08/25/18 179 lb 9.6 oz (81.5 kg)  08/05/18 175 lb 11.3 oz (79.7 kg)  07/20/18 178 lb (80.7 kg)  06/10/18 176 lb (79.8 kg)  04/30/18 179 lb 3.7 oz (81.3 kg)    Physical Exam  Constitutional: He is oriented to person, place, and time and well-developed, well-nourished, and in no distress. No distress.  HENT:  Head: Normocephalic and atraumatic.  Right Ear: Hearing, tympanic membrane, external ear and ear canal normal.  Left Ear: Hearing, tympanic membrane, external ear and ear canal normal.  Nose: Mucosal edema and rhinorrhea present. Right sinus exhibits no maxillary sinus tenderness and no frontal sinus  tenderness. Left sinus exhibits no maxillary sinus tenderness and no frontal sinus tenderness.  Mouth/Throat: Uvula is midline and oropharynx is clear and moist. No oropharyngeal exudate.  Eyes: Pupils are equal, round, and reactive to light.  Neck: Normal range of motion. Neck supple. No JVD present.  Cardiovascular: Normal rate, regular rhythm and normal heart sounds.  Pulmonary/Chest: Effort normal and breath sounds normal. No accessory muscle usage. No respiratory distress. He has no decreased breath sounds. He has no wheezes. He has no rhonchi.  Musculoskeletal: Normal range of motion.        General: No edema.  Lymphadenopathy:    He has no cervical adenopathy.  Neurological: He is alert and oriented to person, place, and  time. Gait normal.  Skin: Skin is warm and dry. He is not diaphoretic. No erythema.  Psychiatric: Mood, memory, affect and judgment normal.  Nursing note and vitals reviewed.     Lab Results:  CBC    Component Value Date/Time   WBC 11.8 (H) 03/08/2018 0538   RBC 4.84 03/08/2018 0538   HGB 11.0 (L) 03/08/2018 0538   HCT 36.4 (L) 03/08/2018 0538   PLT 361 03/08/2018 0538   MCV 75.2 (L) 03/08/2018 0538   MCH 22.7 (L) 03/08/2018 0538   MCHC 30.2 03/08/2018 0538   RDW 15.1 03/08/2018 0538   LYMPHSABS 1.9 12/30/2017 0858   MONOABS 0.7 12/30/2017 0858   EOSABS 0.1 12/30/2017 0858   BASOSABS 0.0 12/30/2017 0858    BMET    Component Value Date/Time   NA 145 (H) 07/30/2018 1632   K 4.7 07/30/2018 1632   CL 105 07/30/2018 1632   CO2 23 07/30/2018 1632   GLUCOSE 173 (H) 07/30/2018 1632   GLUCOSE 87 06/10/2018 1059   BUN 15 07/30/2018 1632   CREATININE 0.97 07/30/2018 1632   CREATININE 0.84 11/30/2013 0856   CALCIUM 9.9 07/30/2018 1632   GFRNONAA 81 07/30/2018 1632   GFRNONAA >89 10/26/2013 0906   GFRAA 94 07/30/2018 1632   GFRAA >89 10/26/2013 0906    BNP No results found for: BNP  ProBNP No results found for: PROBNP    Assessment & Plan:    Pleasant 67 year old male patient complaining follow-up with our office today.  Patient received Pneumovax 23 and he will be up-to-date with his pneumonia vaccines.  Patient to continue with cardiopulmonary rehab.  Patient is currently asymptomatic from gold 2 COPD as well as centrilobular emphysema.  We will hold off on maintenance inhaler at this time.  Discussed with patient symptoms to watch for and if he starts having increased shortness of breath, increased cough, increased sputum, sputum color changes to present to our office.  Patient agrees.  Walk in office today she is the patient does not desaturate with exertion on room air.  Okay for patient not to use oxygen during the day.  Patient to continue 2 L at night.  Follow-up in 6 months  COPD GOLD II A  Pneumovax23 today   Note your daily symptoms > remember "red flags" for COPD:   >>>Increase in cough >>>increase in sputum production >>>increase in shortness of breath or activity  intolerance.   If you notice these symptoms, please call the office to be seen.   Continue oxygen therapy as prescribed - 2L at night, no need for o2 during the day or with exertion  >>>maintain oxygen saturations greater than 88 percent  >>>if unable to maintain oxygen saturations please contact the office  >>>do not smoke with oxygen  >>>can use nasal saline gel or nasal saline rinses to moisturize nose if oxygen causes dryness  Follow-up with our office in 6 months or sooner if symptoms worsen or change  Preventative health care Pneumovax23 today   Continue follow-up with cardiology  Continue follow-up with cardiopulmonary rehab   Nocturnal hypoxemia Continue oxygen therapy as prescribed - 2L at night  >>>maintain oxygen saturations greater than 88 percent  >>>if unable to maintain oxygen saturations please contact the office  >>>do not smoke with oxygen  >>>can use nasal saline gel or nasal saline rinses to moisturize nose if oxygen  causes dryness   Addendum: 09/03/2018 We will place order for overnight oximetry as patient's insurance has changed and he needs  this in order to continue on oxygen This will be on room air    Lauraine Rinne, NP 08/25/2018   This appointment was 28 min long with over 50% of the time in direct face-to-face patient care, assessment, plan of care, and follow-up.

## 2018-08-25 ENCOUNTER — Ambulatory Visit (INDEPENDENT_AMBULATORY_CARE_PROVIDER_SITE_OTHER): Payer: Medicare Other | Admitting: Pulmonary Disease

## 2018-08-25 ENCOUNTER — Encounter: Payer: Self-pay | Admitting: Pulmonary Disease

## 2018-08-25 DIAGNOSIS — Z23 Encounter for immunization: Secondary | ICD-10-CM

## 2018-08-25 DIAGNOSIS — J432 Centrilobular emphysema: Secondary | ICD-10-CM

## 2018-08-25 DIAGNOSIS — G4734 Idiopathic sleep related nonobstructive alveolar hypoventilation: Secondary | ICD-10-CM

## 2018-08-25 DIAGNOSIS — J449 Chronic obstructive pulmonary disease, unspecified: Secondary | ICD-10-CM

## 2018-08-25 DIAGNOSIS — Z0000 Encounter for general adult medical examination without abnormal findings: Secondary | ICD-10-CM

## 2018-08-25 HISTORY — DX: Idiopathic sleep related nonobstructive alveolar hypoventilation: G47.34

## 2018-08-25 HISTORY — DX: Chronic obstructive pulmonary disease, unspecified: J44.9

## 2018-08-25 HISTORY — DX: Centrilobular emphysema: J43.2

## 2018-08-25 NOTE — Assessment & Plan Note (Signed)
Pneumovax23 today   Continue follow-up with cardiology  Continue follow-up with cardiopulmonary rehab

## 2018-08-25 NOTE — Assessment & Plan Note (Signed)
Continue oxygen therapy as prescribed - 2L at night  >>>maintain oxygen saturations greater than 88 percent  >>>if unable to maintain oxygen saturations please contact the office  >>>do not smoke with oxygen  >>>can use nasal saline gel or nasal saline rinses to moisturize nose if oxygen causes dryness

## 2018-08-25 NOTE — Assessment & Plan Note (Signed)
Pneumovax23 today   Note your daily symptoms > remember "red flags" for COPD:   >>>Increase in cough >>>increase in sputum production >>>increase in shortness of breath or activity  intolerance.   If you notice these symptoms, please call the office to be seen.   Continue oxygen therapy as prescribed - 2L at night, no need for o2 during the day or with exertion  >>>maintain oxygen saturations greater than 88 percent  >>>if unable to maintain oxygen saturations please contact the office  >>>do not smoke with oxygen  >>>can use nasal saline gel or nasal saline rinses to moisturize nose if oxygen causes dryness  Follow-up with our office in 6 months or sooner if symptoms worsen or change

## 2018-08-25 NOTE — Patient Instructions (Signed)
Pneumovax23 today   Note your daily symptoms > remember "red flags" for COPD:   >>>Increase in cough >>>increase in sputum production >>>increase in shortness of breath or activity  intolerance.   If you notice these symptoms, please call the office to be seen.   Continue oxygen therapy as prescribed - 2L at night, no need for o2 during the day or with exertion  >>>maintain oxygen saturations greater than 88 percent  >>>if unable to maintain oxygen saturations please contact the office  >>>do not smoke with oxygen  >>>can use nasal saline gel or nasal saline rinses to moisturize nose if oxygen causes dryness  Continue follow-up with cardiology  Continue follow-up with cardiopulmonary rehab  Follow-up with our office in 6 months or sooner if symptoms worsen or change  It is flu season:   >>>Remember to be washing your hands regularly, using hand sanitizer, be careful to use around herself with has contact with people who are sick will increase her chances of getting sick yourself. >>> Best ways to protect herself from the flu: Receive the yearly flu vaccine, practice good hand hygiene washing with soap and also using hand sanitizer when available, eat a nutritious meals, get adequate rest, hydrate appropriately   Please contact the office if your symptoms worsen or you have concerns that you are not improving.   Thank you for choosing [facility] for your healthcare, and for allowing [place] to partner with you on your healthcare journey. I am thankful to be able to provide care to you today.   [name] FNP-C    Chronic Obstructive Pulmonary Disease Chronic obstructive pulmonary disease (COPD) is a long-term (chronic) lung problem. When you have COPD, it is hard for air to get in and out of your lungs. Usually the condition gets worse over time, and your lungs will never return to normal. There are things you can do to keep yourself as healthy as possible.  Your doctor may  treat your condition with: ? Medicines. ? Oxygen. ? Lung surgery.  Your doctor may also recommend: ? Rehabilitation. This includes steps to make your body work better. It may involve a team of specialists. ? Quitting smoking, if you smoke. ? Exercise and changes to your diet. ? Comfort measures (palliative care). Follow these instructions at home: Medicines  Take over-the-counter and prescription medicines only as told by your doctor.  Talk to your doctor before taking any cough or allergy medicines. You may need to avoid medicines that cause your lungs to be dry. Lifestyle  If you smoke, stop. Smoking makes the problem worse. If you need help quitting, ask your doctor.  Avoid being around things that make your breathing worse. This may include smoke, chemicals, and fumes.  Stay active, but remember to rest as well.  Learn and use tips on how to relax.  Make sure you get enough sleep. Most adults need at least 7 hours of sleep every night.  Eat healthy foods. Eat smaller meals more often. Rest before meals. Controlled breathing Learn and use tips on how to control your breathing as told by your doctor. Try:  Breathing in (inhaling) through your nose for 1 second. Then, pucker your lips and breath out (exhale) through your lips for 2 seconds.  Putting one hand on your belly (abdomen). Breathe in slowly through your nose for 1 second. Your hand on your belly should move out. Pucker your lips and breathe out slowly through your lips. Your hand on your  belly should move in as you breathe out.  Controlled coughing Learn and use controlled coughing to clear mucus from your lungs. Follow these steps: 1. Lean your head a little forward. 2. Breathe in deeply. 3. Try to hold your breath for 3 seconds. 4. Keep your mouth slightly open while coughing 2 times. 5. Spit any mucus out into a tissue. 6. Rest and do the steps again 1 or 2 times as needed. General instructions  Make sure  you get all the shots (vaccines) that your doctor recommends. Ask your doctor about a flu shot and a pneumonia shot.  Use oxygen therapy and pulmonary rehabilitation if told by your doctor. If you need home oxygen therapy, ask your doctor if you should buy a tool to measure your oxygen level (oximeter).  Make a COPD action plan with your doctor. This helps you to know what to do if you feel worse than usual.  Manage any other conditions you have as told by your doctor.  Avoid going outside when it is very hot, cold, or humid.  Avoid people who have a sickness you can catch (contagious).  Keep all follow-up visits as told by your doctor. This is important. Contact a doctor if:  You cough up more mucus than usual.  There is a change in the color or thickness of the mucus.  It is harder to breathe than usual.  Your breathing is faster than usual.  You have trouble sleeping.  You need to use your medicines more often than usual.  You have trouble doing your normal activities such as getting dressed or walking around the house. Get help right away if:  You have shortness of breath while resting.  You have shortness of breath that stops you from: ? Being able to talk. ? Doing normal activities.  Your chest hurts for longer than 5 minutes.  Your skin color is more blue than usual.  Your pulse oximeter shows that you have low oxygen for longer than 5 minutes.  You have a fever.  You feel too tired to breathe normally. Summary  Chronic obstructive pulmonary disease (COPD) is a long-term lung problem.  The way your lungs work will never return to normal. Usually the condition gets worse over time. There are things you can do to keep yourself as healthy as possible.  Take over-the-counter and prescription medicines only as told by your doctor.  If you smoke, stop. Smoking makes the problem worse. This information is not intended to replace advice given to you by your health  care provider. Make sure you discuss any questions you have with your health care provider. Document Released: 01/29/2008 Document Revised: 09/16/2016 Document Reviewed: 09/16/2016 Elsevier Interactive Patient Education  2019 [place].   Pneumococcal Vaccine, Polyvalent solution for injection What is this medicine? PNEUMOCOCCAL VACCINE, POLYVALENT (NEU mo KOK al vak SEEN, pol ee VEY luhnt) is a vaccine to prevent pneumococcus bacteria infection. These bacteria are a major cause of ear infections, Strep throat infections, and serious pneumonia, meningitis, or blood infections worldwide. These vaccines help the body to produce antibodies (protective substances) that help your body defend against these bacteria. This vaccine is recommended for people 63 years of age and older with health problems. It is also recommended for all adults over 49 years old. This vaccine will not treat an infection. This medicine may be used for other purposes; ask your health care provider or pharmacist if you have questions. COMMON BRAND NAME(S): Pneumovax 23 What should  I tell my health care provider before I take this medicine? They need to know if you have any of these conditions: -bleeding problems -bone marrow or organ transplant -cancer, Hodgkin's disease -fever -infection -immune system problems -low platelet count in the blood -seizures -an unusual or allergic reaction to pneumococcal vaccine, diphtheria toxoid, other vaccines, latex, other medicines, foods, dyes, or preservatives -pregnant or trying to get pregnant -breast-feeding How should I use this medicine? This vaccine is for injection into a muscle or under the skin. It is given by a health care professional. A copy of Vaccine Information Statements will be given before each vaccination. Read this sheet carefully each time. The sheet may change frequently. Talk to your pediatrician regarding the use of this medicine in children. While this  drug may be prescribed for children as young as 62 years of age for selected conditions, precautions do apply. Overdosage: If you think you have taken too much of this medicine contact a poison control center or emergency room at once. NOTE: This medicine is only for you. Do not share this medicine with others. What if I miss a dose? It is important not to miss your dose. Call your doctor or health care professional if you are unable to keep an appointment. What may interact with this medicine? -medicines for cancer chemotherapy -medicines that suppress your immune function -medicines that treat or prevent blood clots like warfarin, enoxaparin, and dalteparin -steroid medicines like prednisone or cortisone This list may not describe all possible interactions. Give your health care provider a list of all the medicines, herbs, non-prescription drugs, or dietary supplements you use. Also tell them if you smoke, drink alcohol, or use illegal drugs. Some items may interact with your medicine. What should I watch for while using this medicine? Mild fever and pain should go away in 3 days or less. Report any unusual symptoms to your doctor or health care professional. What side effects may I notice from receiving this medicine? Side effects that you should report to your doctor or health care professional as soon as possible: -allergic reactions like skin rash, itching or hives, swelling of the face, lips, or tongue -breathing problems -confused -fever over 102 degrees F -pain, tingling, numbness in the hands or feet -seizures -unusual bleeding or bruising -unusual muscle weakness Side effects that usually do not require medical attention (report to your doctor or health care professional if they continue or are bothersome): -aches and pains -diarrhea -fever of 102 degrees F or less -headache -irritable -loss of appetite -pain, tender at site where injected -trouble sleeping This list may not  describe all possible side effects. Call your doctor for medical advice about side effects. You may report side effects to FDA at 1-800-FDA-1088. Where should I keep my medicine? This does not apply. This vaccine is given in a clinic, pharmacy, doctor's office, or other health care setting and will not be stored at home. NOTE: This sheet is a summary. It may not cover all possible information. If you have questions about this medicine, talk to your doctor, pharmacist, or health care provider.  2019 Elsevier/Gold Standard (2008-03-18 14:32:37)

## 2018-08-26 NOTE — Progress Notes (Signed)
Reviewed, agree 

## 2018-09-01 ENCOUNTER — Ambulatory Visit (INDEPENDENT_AMBULATORY_CARE_PROVIDER_SITE_OTHER): Payer: Medicare Other | Admitting: Thoracic Surgery (Cardiothoracic Vascular Surgery)

## 2018-09-01 VITALS — BP 130/84 | HR 64 | Resp 20 | Ht 74.5 in | Wt 178.0 lb

## 2018-09-01 DIAGNOSIS — Z951 Presence of aortocoronary bypass graft: Secondary | ICD-10-CM

## 2018-09-01 DIAGNOSIS — I2510 Atherosclerotic heart disease of native coronary artery without angina pectoris: Secondary | ICD-10-CM | POA: Diagnosis not present

## 2018-09-01 DIAGNOSIS — Z952 Presence of prosthetic heart valve: Secondary | ICD-10-CM

## 2018-09-01 DIAGNOSIS — I350 Nonrheumatic aortic (valve) stenosis: Secondary | ICD-10-CM | POA: Diagnosis not present

## 2018-09-01 NOTE — Progress Notes (Signed)
[address]       [name] [address]             [phone]     HPI: [name] returns for a six-month follow-up visit  [name] is a 68 year old gentleman with a history of hypertension, hyperlipidemia, and insulin-dependent type 2 diabetes.  Last spring he was found to have critical aortic stenosis and a moderate proximal LAD stenosis.  He underwent coronary bypass grafting x1 and aortic valve replacement on 03/04/2017.  His postoperative course was uncomplicated.  He was seen back in the office in August.  He was doing well at that time.  He continues to feel well.  He is not having any incisional pain.  He does not have any chest pain or shortness of breath.  He has not had any swelling in his legs.  Past Medical History:  Diagnosis Date  . Abnormal myocardial perfusion study 2006   EF 44% ? inferoseptal ischemia. no cath done  . Breast mass, right 03/2008  . Contact dermatitis 01/31/2013  . COPD (chronic obstructive pulmonary disease) ([place])    "dx'd 12/2017"  . Coronary artery disease   . Heart murmur   . History of colonic polyps   . Hyperlipidemia   . Hypertension   . Low back pain   . Type II diabetes mellitus ([place])     Current Outpatient Medications  Medication Sig Dispense Refill  . aspirin EC 325 MG EC tablet Take 1 tablet (325 mg total) by mouth daily.  0  . Cyanocobalamin (VITAMIN B-12 PO) Take 1 tablet by mouth daily.    . dapagliflozin propanediol (FARXIGA) 5 MG TABS tablet Take 5 mg by mouth daily. 30 tablet 5  . furosemide (LASIX) 20 MG tablet TAKE 1 TABLET BY MOUTH EVERY DAY 90 tablet 2  . glucose blood (PRODIGY NO CODING BLOOD GLUC) test strip Use as instructed to check blood sugar twice a day.  DX  E11.40 100 each 1  . hydrALAZINE (APRESOLINE) 10 MG tablet TAKE 1 TABLET BY MOUTH THREE TIMES A DAY 90 tablet 1  . insulin aspart (NOVOLOG FLEXPEN) 100 UNIT/ML FlexPen INJECT 11-15 SUBCUTANEOUSLY DAILY BEFORE SUPPER (Patient  taking differently: Inject 11 Units into the skin daily before supper. ) 15 mL 3  . insulin detemir (LEVEMIR) 100 unit/ml SOLN Inject 0.4 mLs (40 Units total) into the skin daily. 15 mL 11  . Insulin Pen Needle (B-D ULTRAFINE III SHORT PEN) 31G X 8 MM MISC USE AS DIRECTED 100 each 5  . metFORMIN (GLUCOPHAGE-XR) 500 MG 24 hr tablet Take 2 tablets (1,000 mg total) by mouth 2 (two) times daily. 360 tablet 1  . metoprolol tartrate (LOPRESSOR) 50 MG tablet TAKE 1 TABLET BY MOUTH TWICE A DAY 180 tablet 1  . rosuvastatin (CRESTOR) 20 MG tablet TAKE 1 TABLET BY MOUTH EVERY DAY 90 tablet 1   No current facility-administered medications for this visit.     Physical Exam BP 130/84   Pulse 64   Resp 20   Ht 6' 2.5" (1.892 m)   Wt 178 lb (80.7 kg)   SpO2 96% Comment: RA  BMI 22.51 kg/m  68 year old man in no acute distress Alert and oriented x3 with no focal deficits Lungs clear with equal breath sounds bilaterally Cardiac regular rate rhythm normal S1 and S2 no rubs or murmurs Sternum stable, incision well-healed No peripheral edema   Impression: [name] is a 68 year old gentleman who underwent  aortic valve replacement and coronary artery bypass grafting x1 about 6 months ago.  He is doing extremely well at this time.  He does not have any residual surgical issues.  He is not having any signs or symptoms of angina or congestive heart failure.  He is scheduled to have a dental cleaning next week.  He will contact Dr. Wendy Poet office for antibiotics.  Plan: Follow-up with Dr. Agustin Cree I will be happy to see [name] back anytime in the future if I can be of any further assistance with his care  Melrose Nakayama, MD [facility] [phone]

## 2018-09-03 ENCOUNTER — Telehealth: Payer: Self-pay | Admitting: Cardiology

## 2018-09-03 NOTE — Telephone Encounter (Signed)
Patient states he needs an antibiotic for oral surgery. He was told by his dentist since he has had heart surgery, this is a requirement before he can have his oral procedure.  Please call patient to discuss

## 2018-09-03 NOTE — Addendum Note (Signed)
Addended by: Lauraine Rinne on: 09/03/2018 11:12 AM   Modules accepted: Orders

## 2018-09-04 MED ORDER — AMOXICILLIN 500 MG PO TABS: ORAL_TABLET | ORAL | 0 refills | Status: DC

## 2018-09-04 NOTE — Telephone Encounter (Signed)
Please advise. Thanks.  

## 2018-09-04 NOTE — Telephone Encounter (Signed)
Please thank him for calling he is absolutely correct and aortic valve replacement needs to take amoxicillin 2 g 30 minutes before his dental procedure let us give him #21 refill and tell him to use it for any visits to the dentist

## 2018-09-04 NOTE — Telephone Encounter (Signed)
Patient informed to take amoxicillin 4 tablets (2 grams) 30-45 minute prior to any dental procedures. Patient verbalized understanding. Prescription has been sent to [facility] in [place]. No further questions.

## 2018-09-08 ENCOUNTER — Ambulatory Visit (INDEPENDENT_AMBULATORY_CARE_PROVIDER_SITE_OTHER): Payer: Medicare Other | Admitting: Family

## 2018-09-08 ENCOUNTER — Encounter: Payer: Self-pay | Admitting: Family

## 2018-09-08 VITALS — BP 138/78 | HR 60 | Temp 98.2°F | Resp 16 | Ht 75.0 in | Wt 181.0 lb

## 2018-09-08 DIAGNOSIS — Z1159 Encounter for screening for other viral diseases: Secondary | ICD-10-CM

## 2018-09-08 DIAGNOSIS — Z125 Encounter for screening for malignant neoplasm of prostate: Secondary | ICD-10-CM | POA: Diagnosis not present

## 2018-09-08 DIAGNOSIS — E1165 Type 2 diabetes mellitus with hyperglycemia: Secondary | ICD-10-CM

## 2018-09-08 DIAGNOSIS — E782 Mixed hyperlipidemia: Secondary | ICD-10-CM

## 2018-09-08 DIAGNOSIS — I2510 Atherosclerotic heart disease of native coronary artery without angina pectoris: Secondary | ICD-10-CM | POA: Diagnosis not present

## 2018-09-08 DIAGNOSIS — I10 Essential (primary) hypertension: Secondary | ICD-10-CM

## 2018-09-08 LAB — MICROALBUMIN / CREATININE URINE RATIO
CREATININE, U: 79 mg/dL
Microalb Creat Ratio: 0.9 mg/g (ref 0.0–30.0)
Microalb, Ur: <0.7 mg/dL (ref 0.0–1.9)

## 2018-09-08 LAB — PSA, MEDICARE: PSA: 0.26 ng/ml (ref 0.10–4.00)

## 2018-09-08 LAB — HEMOGLOBIN A1C: Hgb A1c MFr Bld: 7.8 % — ABNORMAL HIGH (ref 4.6–6.5)

## 2018-09-08 LAB — BASIC METABOLIC PANEL
BUN: 16 mg/dL (ref 6–23)
CO2: 26 mEq/L (ref 19–32)
Calcium: 9.8 mg/dL (ref 8.4–10.5)
Chloride: 101 mEq/L (ref 96–112)
Creatinine, Ser: 0.84 mg/dL (ref 0.40–1.50)
GFR: 117.2 mL/min (ref >60.00)
Glucose, Bld: 88 mg/dL (ref 70–99)
POTASSIUM: 5.2 meq/L — AB (ref 3.5–5.1)
SODIUM: 138 meq/L (ref 135–145)

## 2018-09-08 MED ORDER — ROSUVASTATIN CALCIUM 20 MG PO TABS: 20.0000 mg | ORAL_TABLET | Freq: Every day | ORAL | 1 refills | Status: DC

## 2018-09-08 MED ORDER — INSULIN ASPART 100 UNIT/ML FLEXPEN: PEN_INJECTOR | SUBCUTANEOUS | 3 refills | Status: DC

## 2018-09-08 MED ORDER — METOPROLOL TARTRATE 50 MG PO TABS: 50.0000 mg | ORAL_TABLET | Freq: Two times a day (BID) | ORAL | 1 refills | Status: DC

## 2018-09-08 MED ORDER — METFORMIN HCL ER 500 MG PO TB24: 1000.0000 mg | ORAL_TABLET | Freq: Two times a day (BID) | ORAL | 1 refills | Status: DC

## 2018-09-08 MED ORDER — DAPAGLIFLOZIN PROPANEDIOL 5 MG PO TABS: 5.0000 mg | ORAL_TABLET | Freq: Every day | ORAL | 5 refills | Status: DC

## 2018-09-08 NOTE — Patient Instructions (Signed)
Please complete lab work prior to leaving.   

## 2018-09-08 NOTE — Addendum Note (Signed)
Addended by: Debbrah Alar on: 09/08/2018 09:37 AM   Modules accepted: Orders

## 2018-09-08 NOTE — Progress Notes (Signed)
   Subjective:    Patient ID: [name], male    DOB: Feb 24, 1951, 68 y.o.   MRN: [id number]  HPI  Patient is a 68 yr old male who presents today for follow up.    HTN- maintained on lopressor, hydralazine.  BP Readings from Last 3 Encounters:  09/08/18 138/78  09/01/18 130/84  08/25/18 126/74   DM2- continues farxiga, metformin, levemir 40 sq daily and novolog 11 units AC dinner. Reports home blood sugars have been well controlled.  Generally 90-110.  No reported hypoglycemia. Lab Results  Component Value Date   HGBA1C 7.3 (H) 06/10/2018   HGBA1C 7.3 (H) 03/02/2018   HGBA1C 7.9 (H) 12/15/2017   Lab Results  Component Value Date   MICROALBUR 0.9 08/20/2017   LDLCALC 72 06/10/2018   CREATININE 0.97 07/30/2018   Hyperlipidemia- maintained on crestor.  Lab Results  Component Value Date   CHOL 133 06/10/2018   HDL 47.80 06/10/2018   LDLCALC 72 06/10/2018   LDLDIRECT 177.6 10/07/2007   TRIG 63.0 06/10/2018   CHOLHDL 3 06/10/2018      Review of Systems     Objective:   Physical Exam Constitutional:      General: He is not in acute distress.    Appearance: He is well-developed.  HENT:     Head: Normocephalic and atraumatic.  Cardiovascular:     Rate and Rhythm: Normal rate and regular rhythm.     Heart sounds: No murmur.  Pulmonary:     Effort: Pulmonary effort is normal. No respiratory distress.     Breath sounds: Normal breath sounds. No wheezing or rales.  Skin:    General: Skin is warm and dry.  Neurological:     Mental Status: He is alert and oriented to person, place, and time.  Psychiatric:        Behavior: Behavior normal.        Thought Content: Thought content normal.           Assessment & Plan:  HTN- bp stable, continue current meds.  DM2- stable sugars, continue current regimen, obtain follow up A1C.  Hyperlipidemia- LDL at goal, continue statin.    Also due for PSA/hep c screening which I discussed with pt today and he is  agreeable to proceed.

## 2018-09-09 LAB — HEPATITIS C ANTIBODY
Hepatitis C Ab: NONREACTIVE
SIGNAL TO CUT-OFF: 0.03 (ref <1.00)

## 2018-09-10 ENCOUNTER — Telehealth: Payer: Self-pay | Admitting: Family

## 2018-09-10 DIAGNOSIS — H524 Presbyopia: Secondary | ICD-10-CM | POA: Diagnosis not present

## 2018-09-10 DIAGNOSIS — H5203 Hypermetropia, bilateral: Secondary | ICD-10-CM | POA: Diagnosis not present

## 2018-09-10 DIAGNOSIS — E103293 Type 1 diabetes mellitus with mild nonproliferative diabetic retinopathy without macular edema, bilateral: Secondary | ICD-10-CM | POA: Diagnosis not present

## 2018-09-10 LAB — HM DIABETES EYE EXAM

## 2018-09-10 MED ORDER — INSULIN DETEMIR 100 UNIT/ML FLEXPEN: 45.0000 [IU] | Freq: Every day | SUBCUTANEOUS | 11 refills | Status: DC

## 2018-09-10 NOTE — Telephone Encounter (Signed)
Potassium is mildly elevated.  Please ask pt to repeat bmet in 1 week, dx hyperkalemia. A1C has gone up slightly to 7.8.  I would like him to increase his levemir dose from

## 2018-09-11 ENCOUNTER — Other Ambulatory Visit: Payer: Self-pay

## 2018-09-11 DIAGNOSIS — E875 Hyperkalemia: Secondary | ICD-10-CM

## 2018-09-11 NOTE — Telephone Encounter (Signed)
Advsied patient of results and scheduled him to come back 09-17-18 for repeat potassium. He was advised to increase Levemir to 45 units (as noted on new prescription).

## 2018-09-17 ENCOUNTER — Other Ambulatory Visit (INDEPENDENT_AMBULATORY_CARE_PROVIDER_SITE_OTHER): Payer: Medicare Other

## 2018-09-17 DIAGNOSIS — E875 Hyperkalemia: Secondary | ICD-10-CM

## 2018-09-17 LAB — BASIC METABOLIC PANEL
BUN: 14 mg/dL (ref 6–23)
CO2: 30 mEq/L (ref 19–32)
Calcium: 9.8 mg/dL (ref 8.4–10.5)
Chloride: 102 mEq/L (ref 96–112)
Creatinine, Ser: 0.91 mg/dL (ref 0.40–1.50)
GFR: 100.53 mL/min (ref >60.00)
Glucose, Bld: 145 mg/dL — ABNORMAL HIGH (ref 70–99)
POTASSIUM: 5 meq/L (ref 3.5–5.1)
Sodium: 141 mEq/L (ref 135–145)

## 2018-09-20 NOTE — Progress Notes (Deleted)
@Patient  ID: [name], male    DOB: December 16, 1950, 68 y.o.   MRN: [id number]  No chief complaint on file.   Referring provider: Debbrah Alar, NP  HPI:  68 year old male former smoker initially referred to our office on 02/02/2018 for dyspnea.  Diagnosed with Gold II COPD, emphysema, Chronic respiratory failure requiring oxygen with exertion and at night.   PMH: Severe aortic stenosis, status post AVR, CAD, cardiomyopathy, dyslipidemia Smoker/ Smoking History: Former smoker.  41-pack-year smoking history.  Quit 2012. Maintenance:  none Pt of: Dr. Lake Bells  09/20/2018  - Visit   HPI  Tests:  04/09/18 >>> ONO - telephone note>>> Patient's ONO on room air showed that he was less than 88% for more than an hour. Patient needs to be on 2 L at HS.  02/04/2018-high-res CT- no evidence of interstitial lung disease, severe centrilobular emphysema and mild diffuse bronchial wall thickening suggesting COPD, no calcified pleural plaques no pleural effusion, mild smooth bilateral pleural thickening which is nonspecific  05/21/2018-echocardiogram-LV ejection fraction 40 to 95%, grade 1 diastolic dysfunction  2/84/1324-MWNUUVOZD function test- FVC 3.37 (71% predicted), postbronchodilator ratio 50, postbronchodilator FEV1 54, mid flow reversibility after bronchodilator, DLCO 36 >>>Moderate airflow obstruction, diminished diffusion capacity  02/03/2018-home sleep study- AHI 6 an hour, SaO2 low 85%  Echo: 11/2017 TTE> Critical AS, LVEF 35-40%, mod AR, RVSP 41 mmHg  Heart Catheterization: 12/2017 LHC and RHC showed severe AS, non-obstructive multi-vessel CAD, PA mean 30  FENO:  No results found for: NITRICOXIDE  PFT: PFT Results Latest Ref Rng & Units 02/04/2018  FVC-Pre L 3.37  FVC-Predicted Pre % 71  FVC-Post L 3.88  FVC-Predicted Post % 82  Pre FEV1/FVC % % 53  Post FEV1/FCV % % 50  FEV1-Pre L 1.78  FEV1-Predicted Pre % 49  FEV1-Post L 1.96  DLCO UNC% % 36  DLCO COR  %Predicted % 57  TLC L 7.36  TLC % Predicted % 91  RV % Predicted % 125    Imaging: No results found.    Specialty Problems      Pulmonary Problems   Dyspnea on exertion   Centrilobular emphysema (HCC)    02/04/2018-high-res CT- no evidence of interstitial lung disease, severe centrilobular emphysema and mild diffuse bronchial wall thickening suggesting COPD, no calcified pleural plaques no pleural effusion, mild smooth bilateral pleural thickening which is nonspecific  02/04/2018-pulmonary function test- FVC 3.37 (71% predicted), postbronchodilator ratio 50, postbronchodilator FEV1 54, mid flow reversibility after bronchodilator, DLCO 36 >>>Moderate airflow obstruction, diminished diffusion capacity      COPD GOLD II A     02/04/2018-high-res CT- no evidence of interstitial lung disease, severe centrilobular emphysema and mild diffuse bronchial wall thickening suggesting COPD, no calcified pleural plaques no pleural effusion, mild smooth bilateral pleural thickening which is nonspecific  02/04/2018-pulmonary function test- FVC 3.37 (71% predicted), postbronchodilator ratio 50, postbronchodilator FEV1 54, mid flow reversibility after bronchodilator, DLCO 36 >>>Moderate airflow obstruction, diminished diffusion capacity      Nocturnal hypoxemia    04/09/18 >>> ONO - telephone note>>> Patient's ONO on room air showed that he was less than 88% for more than an hour. Patient needs to be on 2 L at HS.          Allergies  Allergen Reactions  . Amlodipine Besylate Other (See Comments)    REACTION: ? caused left axillary nodules/chest flutter  . Hydrochlorothiazide Hives  . Lisinopril     Hyperkalemia     Immunization History  Administered Date(s)  Administered  . Influenza Split 08/04/2012  . Influenza Whole 05/20/2008, 07/12/2009, 05/02/2010  . Influenza, High Dose Seasonal PF 05/20/2017, 06/10/2018  . Influenza,inj,Quad PF,6+ Mos 05/07/2013, 05/09/2014, 06/07/2015, 05/01/2016   . Pneumococcal Conjugate-13 02/05/2017  . Pneumococcal Polysaccharide-23 05/20/2008, 08/25/2018  . Td 08/26/2008    Past Medical History:  Diagnosis Date  . Abnormal myocardial perfusion study 2006   EF 44% ? inferoseptal ischemia. no cath done  . Breast mass, right 03/2008  . Contact dermatitis 01/31/2013  . COPD (chronic obstructive pulmonary disease) ([place])    "dx'd 12/2017"  . Coronary artery disease   . Heart murmur   . History of colonic polyps   . Hyperlipidemia   . Hypertension   . Low back pain   . Type II diabetes mellitus (HCC)     Tobacco History: Social History   Tobacco Use  Smoking Status Former Smoker  . Packs/day: 1.00  . Years: 41.00  . Pack years: 41.00  . Types: Cigarettes  . Last attempt to quit: 08/26/2010  . Years since quitting: 8.0  Smokeless Tobacco Never Used  Tobacco Comment   Quit 2012   Counseling given: Not Answered Comment: Quit 2012   Outpatient Encounter Medications as of 09/21/2018  Medication Sig  . amoxicillin (AMOXIL) 500 MG tablet Take 4 tablets (2,000 mg) by mouth 45 minutes before any dental procedure.  Marland Kitchen aspirin EC 325 MG EC tablet Take 1 tablet (325 mg total) by mouth daily.  . Cyanocobalamin (VITAMIN B-12 PO) Take 1 tablet by mouth daily.  . dapagliflozin propanediol (FARXIGA) 5 MG TABS tablet Take 5 mg by mouth daily.  . furosemide (LASIX) 20 MG tablet TAKE 1 TABLET BY MOUTH EVERY DAY  . glucose blood (PRODIGY NO CODING BLOOD GLUC) test strip Use as instructed to check blood sugar twice a day.  DX  E11.40  . hydrALAZINE (APRESOLINE) 10 MG tablet TAKE 1 TABLET BY MOUTH THREE TIMES A DAY  . insulin aspart (NOVOLOG FLEXPEN) 100 UNIT/ML FlexPen INJECT 11-15 SUBCUTANEOUSLY DAILY BEFORE SUPPER  . insulin detemir (LEVEMIR) 100 unit/ml SOLN Inject 0.45 mLs (45 Units total) into the skin daily.  . Insulin Pen Needle (B-D ULTRAFINE III SHORT PEN) 31G X 8 MM MISC USE AS DIRECTED  . metFORMIN (GLUCOPHAGE-XR) 500 MG 24 hr tablet Take 2  tablets (1,000 mg total) by mouth 2 (two) times daily.  . metoprolol tartrate (LOPRESSOR) 50 MG tablet Take 1 tablet (50 mg total) by mouth 2 (two) times daily.  . rosuvastatin (CRESTOR) 20 MG tablet Take 1 tablet (20 mg total) by mouth daily.   No facility-administered encounter medications on file as of 09/21/2018.      Review of Systems  Review of Systems   Physical Exam  There were no vitals taken for this visit.  Wt Readings from Last 5 Encounters:  09/08/18 181 lb (82.1 kg)  09/01/18 178 lb (80.7 kg)  08/25/18 179 lb 9.6 oz (81.5 kg)  08/05/18 175 lb 11.3 oz (79.7 kg)  07/20/18 178 lb (80.7 kg)     Physical Exam    Lab Results:  CBC    Component Value Date/Time   WBC 11.8 (H) 03/08/2018 0538   RBC 4.84 03/08/2018 0538   HGB 11.0 (L) 03/08/2018 0538   HCT 36.4 (L) 03/08/2018 0538   PLT 361 03/08/2018 0538   MCV 75.2 (L) 03/08/2018 0538   MCH 22.7 (L) 03/08/2018 0538   MCHC 30.2 03/08/2018 0538   RDW 15.1 03/08/2018 0538  LYMPHSABS 1.9 12/30/2017 0858   MONOABS 0.7 12/30/2017 0858   EOSABS 0.1 12/30/2017 0858   BASOSABS 0.0 12/30/2017 0858    BMET    Component Value Date/Time   NA 141 09/17/2018 0841   NA 145 (H) 07/30/2018 1632   K 5.0 09/17/2018 0841   CL 102 09/17/2018 0841   CO2 30 09/17/2018 0841   GLUCOSE 145 (H) 09/17/2018 0841   BUN 14 09/17/2018 0841   BUN 15 07/30/2018 1632   CREATININE 0.91 09/17/2018 0841   CREATININE 0.84 11/30/2013 0856   CALCIUM 9.8 09/17/2018 0841   GFRNONAA 81 07/30/2018 1632   GFRNONAA >89 10/26/2013 0906   GFRAA 94 07/30/2018 1632   GFRAA >89 10/26/2013 0906    BNP No results found for: BNP  ProBNP No results found for: PROBNP    Assessment & Plan:     No problem-specific Assessment & Plan notes found for this encounter.     Lauraine Rinne, NP 09/20/2018   This appointment was *** with over 50% of the time in direct face-to-face patient care, assessment, plan of care, and follow-up.

## 2018-09-21 ENCOUNTER — Other Ambulatory Visit: Payer: Self-pay | Admitting: Pulmonary Disease

## 2018-09-21 ENCOUNTER — Ambulatory Visit: Payer: Medicare Other | Admitting: Pulmonary Disease

## 2018-09-24 ENCOUNTER — Telehealth: Payer: Self-pay | Admitting: Pulmonary Disease

## 2018-09-24 ENCOUNTER — Other Ambulatory Visit: Payer: Self-pay | Admitting: Pulmonary Disease

## 2018-09-24 ENCOUNTER — Ambulatory Visit: Payer: Medicare Other | Admitting: Pulmonary Disease

## 2018-09-24 DIAGNOSIS — G4734 Idiopathic sleep related nonobstructive alveolar hypoventilation: Secondary | ICD-10-CM

## 2018-09-24 NOTE — Telephone Encounter (Signed)
Called [facility] on [phone]. On hold for 7.49 minutes without pick up. Will try again later.

## 2018-09-24 NOTE — Addendum Note (Signed)
Addended by: Amado Coe on: 09/24/2018 02:23 PM   Modules accepted: Orders

## 2018-09-24 NOTE — Telephone Encounter (Signed)
Patient's wife [name] called me back she wants to cancel the appointment for today and reschedule once they have a good ONO done through [facility]. I told [name] to call [facility] and find out when the ONO was going to be done and then call me once the ONO was done.   I cancelled appointment for 1500 with [name]. Nothing further needed

## 2018-09-24 NOTE — Telephone Encounter (Addendum)
Called [facility] [phone]) and spoke with Sharyn Lull and was advise the pt does not need the face-to-face BEFORE the ONO, no sequential order. For insurnace purposes the ONO, face-to-face, and the O2 order will all need to be within 30 days. Pt is scheduled today for a visit with [name], NP, but pt has already had two unsuccessful ONOs and may not need to come in. Fax from [facility] has been received, will hand deliver this to [place].   Lauren please advise on conversation with pt. Thanks.

## 2018-09-24 NOTE — Telephone Encounter (Signed)
She states since this, they have been contacted by insurance stating they did not come for appointment.  She is very upset and asks for a call back this morning.

## 2018-09-24 NOTE — Telephone Encounter (Signed)
Called and spoke with Patients Wife, [name]. She stated that the Patient was scheduled with [name], NP, 09/21/18, for follow up sleep pulse oximetry test.  She stated that someone came out and told them, he does not need to see [name], NP, because all the required paperwork was signed and was going to be faxed to Medicare.  They left and received a call Tuesday 09/22/18, stating that they no showed for his appointment with [name], NP.  She then stated that they received a call from Medicare yesterday, 09/23/18, stating that they were going to take the Patients oxygen machine away, because non compliance with his pulmonary appointment.  Appointment scheduled for 09/24/18, 3pm, with [name], NP.  Nothing further at this time.

## 2018-09-24 NOTE — Telephone Encounter (Signed)
Called and spoke to patient and patient's wife. I explained to her that the whole process needs to be done within 30 days. I explained that the Office visit can come after the ONO. Since patient has had two ONO's that were unsuccessful to meet required time; I would hate for the patient to have an office visit and another ONO that didn't meet requirements for the patient to have to come in for another OV because the 30 day window. I suggested to patient's wife to call [facility] and see when they would be able to have the ONO and once a successful ONO was done, to call us and we could get the patient in for an OV shortly after (with Aaron Edelman okay to double book patient preference).  She said that she would call [facility] and then let me know if she wanted to keep the appointment today with Aaron Edelman at 1500 or reschedule after the successful ONO was done.   Will leave message open until hear back from wife

## 2018-09-24 NOTE — Telephone Encounter (Signed)
09/24/2018 1117  Let me try to clarify the situation going on with the patient.  Patient presented to our office in December/2019 regarding oxygen follow-up.  The patient at that point in time did not communicate that he was switching insurances and that is why he would need to have additional testing done.  At that time he was continued on his overnight oximetry as he has in August/2019 overnight oximetry test showing that he needs oxygen.  We were contacted by his DME company [facility] [employer] in January and stated that due to patient switching insurance companies he does still need to have an overnight oximetry test.  An overnight oximetry test was ordered in January/04/2019.  We still do not have the results from this overnight oximetry test from [facility] [employer] his DME company.  When the patient arrived to our office on 09/21/2018, it was communicated that we still do not have the results of his overnight oximetry study.  The patient communicated that he only slept for about 20 minutes that night and was wondering if it needed to be redone.  At that point in time another overnight oximetry test was ordered.  It was felt that an office visit was not needed as we still do not have the clinical data from the DME company to justify and support patient's continued oxygen at night.  To be clear: The patient was never a no-show for his pulmonary office visit.  We still do not have the overnight oximetry study which is what we need in order to justify overnight oxygen use.  I will have my nurse Lauren contact his DME ([facility] company to check the status of the overnight oximetry results.  Unfortunately I believe what has happened is as as [facility] [employer] has transitioned to [employer] the patient is getting lost in the shuffle.  Triage can you please update the patient regarding this status.   [name] FNP

## 2018-10-06 DIAGNOSIS — R0902 Hypoxemia: Secondary | ICD-10-CM | POA: Diagnosis not present

## 2018-10-06 DIAGNOSIS — J449 Chronic obstructive pulmonary disease, unspecified: Secondary | ICD-10-CM | POA: Diagnosis not present

## 2018-10-09 ENCOUNTER — Telehealth: Payer: Self-pay | Admitting: Pulmonary Disease

## 2018-10-09 NOTE — Telephone Encounter (Signed)
Patient's wife called about needing requlaifation of Medicare for sleep study requested to be called at [phone].Marland Kitchen

## 2018-10-09 NOTE — Telephone Encounter (Signed)
Spoke with pt, he states they made an appt with [name] and that there was nothing further needed.

## 2018-10-11 NOTE — Progress Notes (Addendum)
@Patient  ID: [name], male    DOB: [birth year], 68 y.o.   MRN: [id number]  Chief Complaint  Patient presents with  . Follow-up    Breathing is good here to go over ono.    Referring provider: Debbrah Alar, NP  HPI:  68 year old male former smoker initially referred to our office on 02/02/2018 for dyspnea.  Diagnosed with Gold II COPD, emphysema, Chronic respiratory failure requiring oxygen with exertion and at night.   PMH: Severe aortic stenosis, status post AVR, CAD, cardiomyopathy, dyslipidemia Smoker/ Smoking History: Former smoker.  41-pack-year smoking history.  Quit 2012. Maintenance:  none, patient has previously failed inhaler therapy and found no benefit, including albuterol rescue inhalers, currently not maintained on any inhalers  Pt of: Dr. Lake Bells  10/12/2018  - Visit   68 year old male former smoker presenting to our office today for a follow-up visit to review overnight oximetry test which he recently completed.  This ono was required due to patient's insurance being changed.  Patient is currently maintained on 2 L of O2 at night.  Patient reports no acute symptoms or changes in his breathing at this time.  10/06/2018-overnight oximetry-entire duration was 4 hours and 23 minutes, SPO2 less than 88% for 25 minutes and 32 seconds  MMRC - Breathlessness Score 1 - I get short of breath when hurrying on level ground or walking up a slight hill     Tests:   04/09/18 >>> ONO - telephone note>>> Patient's ONO on room air showed that he was less than 88% for more than an hour. Patient needs to be on 2 L at HS.  10/06/2018-overnight oximetry-entire duration was 4 hours and 23 minutes, SPO2 less than 88% for 25 minutes and 32 seconds  02/04/2018-high-res CT- no evidence of interstitial lung disease, severe centrilobular emphysema and mild diffuse bronchial wall thickening suggesting COPD, no calcified pleural plaques no pleural effusion, mild smooth bilateral  pleural thickening which is nonspecific  05/21/2018-echocardiogram-LV ejection fraction 40 to 41%, grade 1 diastolic dysfunction  9/62/2297-[id number] function test- FVC 3.37 (71% predicted), postbronchodilator ratio 50, postbronchodilator FEV1 54, mid flow reversibility after bronchodilator, DLCO 36 >>>Moderate airflow obstruction, diminished diffusion capacity  02/03/2018-home sleep study- AHI 6 an hour, SaO2 low 85%  Echo: 11/2017 TTE> Critical AS, LVEF 35-40%, mod AR, RVSP 41 mmHg  Heart Catheterization: 12/2017 LHC and RHC showed severe AS, non-obstructive multi-vessel CAD, PA mean 30   FENO:  No results found for: NITRICOXIDE  PFT: PFT Results Latest Ref Rng & Units 02/04/2018  FVC-Pre L 3.37  FVC-Predicted Pre % 71  FVC-Post L 3.88  FVC-Predicted Post % 82  Pre FEV1/FVC % % 53  Post FEV1/FCV % % 50  FEV1-Pre L 1.78  FEV1-Predicted Pre % 49  FEV1-Post L 1.96  DLCO UNC% % 36  DLCO COR %Predicted % 57  TLC L 7.36  TLC % Predicted % 91  RV % Predicted % 125    Imaging: No results found.    Specialty Problems      Pulmonary Problems   Dyspnea on exertion   Centrilobular emphysema (HCC)    02/04/2018-high-res CT- no evidence of interstitial lung disease, severe centrilobular emphysema and mild diffuse bronchial wall thickening suggesting COPD, no calcified pleural plaques no pleural effusion, mild smooth bilateral pleural thickening which is nonspecific  02/04/2018-pulmonary function test- FVC 3.37 (71% predicted), postbronchodilator ratio 50, postbronchodilator FEV1 54, mid flow reversibility after bronchodilator, DLCO 36 >>>Moderate airflow obstruction, diminished diffusion capacity  COPD GOLD II A     02/04/2018-high-res CT- no evidence of interstitial lung disease, severe centrilobular emphysema and mild diffuse bronchial wall thickening suggesting COPD, no calcified pleural plaques no pleural effusion, mild smooth bilateral pleural thickening which is  nonspecific  02/04/2018-pulmonary function test- FVC 3.37 (71% predicted), postbronchodilator ratio 50, postbronchodilator FEV1 54, mid flow reversibility after bronchodilator, DLCO 36 >>>Moderate airflow obstruction, diminished diffusion capacity      Nocturnal hypoxemia    04/09/18 >>> ONO - telephone note>>> Patient's ONO on room air showed that he was less than 88% for more than an hour. Patient needs to be on 2 L at HS.  10/06/2018-overnight oximetry-entire duration was 4 hours and 23 minutes, SPO2 less than 88% for 25 minutes and 32 seconds           Allergies  Allergen Reactions  . Amlodipine Besylate Other (See Comments)    REACTION: ? caused left axillary nodules/chest flutter  . Hydrochlorothiazide Hives  . Lisinopril     Hyperkalemia     Immunization History  Administered Date(s) Administered  . Influenza Split 08/04/2012  . Influenza Whole 05/20/2008, 07/12/2009, 05/02/2010  . Influenza, High Dose Seasonal PF 05/20/2017, 06/10/2018  . Influenza,inj,Quad PF,6+ Mos 05/07/2013, 05/09/2014, 06/07/2015, 05/01/2016  . Pneumococcal Conjugate-13 02/05/2017  . Pneumococcal Polysaccharide-23 05/20/2008, 08/25/2018  . Td 08/26/2008    Past Medical History:  Diagnosis Date  . Abnormal myocardial perfusion study 2006   EF 44% ? inferoseptal ischemia. no cath done  . Breast mass, right 03/2008  . Contact dermatitis 01/31/2013  . COPD (chronic obstructive pulmonary disease) ([place])    "dx'd 12/2017"  . Coronary artery disease   . Heart murmur   . History of colonic polyps   . Hyperlipidemia   . Hypertension   . Low back pain   . Type II diabetes mellitus (HCC)     Tobacco History: Social History   Tobacco Use  Smoking Status Former Smoker  . Packs/day: 1.00  . Years: 41.00  . Pack years: 41.00  . Types: Cigarettes  . Last attempt to quit: 08/26/2010  . Years since quitting: 8.1  Smokeless Tobacco Never Used  Tobacco Comment   Quit 2012   Counseling given:  Yes Comment: Quit 2012  Continue to not smoke  Outpatient Encounter Medications as of 10/12/2018  Medication Sig  . amoxicillin (AMOXIL) 500 MG tablet Take 4 tablets (2,000 mg) by mouth 45 minutes before any dental procedure.  Marland Kitchen aspirin EC 325 MG EC tablet Take 1 tablet (325 mg total) by mouth daily.  . Cyanocobalamin (VITAMIN B-12 PO) Take 1 tablet by mouth daily.  . dapagliflozin propanediol (FARXIGA) 5 MG TABS tablet Take 5 mg by mouth daily.  . furosemide (LASIX) 20 MG tablet TAKE 1 TABLET BY MOUTH EVERY DAY  . glucose blood (PRODIGY NO CODING BLOOD GLUC) test strip Use as instructed to check blood sugar twice a day.  DX  E11.40  . hydrALAZINE (APRESOLINE) 10 MG tablet TAKE 1 TABLET BY MOUTH THREE TIMES A DAY  . insulin aspart (NOVOLOG FLEXPEN) 100 UNIT/ML FlexPen INJECT 11-15 SUBCUTANEOUSLY DAILY BEFORE SUPPER  . insulin detemir (LEVEMIR) 100 unit/ml SOLN Inject 0.45 mLs (45 Units total) into the skin daily.  . Insulin Pen Needle (B-D ULTRAFINE III SHORT PEN) 31G X 8 MM MISC USE AS DIRECTED  . metFORMIN (GLUCOPHAGE-XR) 500 MG 24 hr tablet Take 2 tablets (1,000 mg total) by mouth 2 (two) times daily.  . metoprolol tartrate (LOPRESSOR) 50 MG  tablet Take 1 tablet (50 mg total) by mouth 2 (two) times daily.  . rosuvastatin (CRESTOR) 20 MG tablet Take 1 tablet (20 mg total) by mouth daily.   No facility-administered encounter medications on file as of 10/12/2018.      Review of Systems  Review of Systems  Constitutional: Negative for activity change, chills, fatigue, fever and unexpected weight change.  HENT: Negative for postnasal drip, rhinorrhea, sneezing and sore throat.   Eyes: Negative.   Respiratory: Negative for cough, shortness of breath and wheezing.   Cardiovascular: Negative for chest pain and palpitations.  Gastrointestinal: Negative for constipation, diarrhea, nausea and vomiting.  Endocrine: Negative.   Musculoskeletal: Negative.   Skin: Negative.   Neurological:  Negative for dizziness and headaches.  Psychiatric/Behavioral: Negative.  Negative for dysphoric mood. The patient is not nervous/anxious.   All other systems reviewed and are negative.    Physical Exam  BP 124/82 (BP Location: Left Arm, Patient Position: Sitting, Cuff Size: Normal)   Pulse 71   Ht 6\' 2"  (1.88 m)   Wt 181 lb (82.1 kg)   SpO2 92%   BMI 23.24 kg/m   Wt Readings from Last 5 Encounters:  10/12/18 181 lb (82.1 kg)  09/08/18 181 lb (82.1 kg)  09/01/18 178 lb (80.7 kg)  08/25/18 179 lb 9.6 oz (81.5 kg)  08/05/18 175 lb 11.3 oz (79.7 kg)     Physical Exam  Constitutional: He is oriented to person, place, and time and well-developed, well-nourished, and in no distress. No distress.  HENT:  Head: Normocephalic and atraumatic.  Right Ear: Hearing, tympanic membrane, external ear and ear canal normal.  Left Ear: Hearing, tympanic membrane, external ear and ear canal normal.  Nose: Nose normal. Right sinus exhibits no maxillary sinus tenderness and no frontal sinus tenderness. Left sinus exhibits no maxillary sinus tenderness and no frontal sinus tenderness.  Mouth/Throat: Uvula is midline and oropharynx is clear and moist. No oropharyngeal exudate.  Eyes: Pupils are equal, round, and reactive to light.  Neck: Normal range of motion. Neck supple.  Cardiovascular: Normal rate, regular rhythm and normal heart sounds.  Pulmonary/Chest: Effort normal and breath sounds normal. No accessory muscle usage. No respiratory distress. He has no decreased breath sounds. He has no wheezes. He has no rhonchi.  Musculoskeletal: Normal range of motion.  Lymphadenopathy:    He has no cervical adenopathy.  Neurological: He is alert and oriented to person, place, and time. Gait normal.  Skin: Skin is warm and dry. He is not diaphoretic. No erythema.  Psychiatric: Mood, memory, affect and judgment normal.  Nursing note and vitals reviewed.   SIX MIN WALK 10/12/2018 08/25/2018  Supplimental  Oxygen during Test? (L/min) No No  Tech Comments: Fast paced walk with no sob noted.  572ft completed. pt completed 759ft with no complaints or desats.  moderate pace     Lab Results:  CBC    Component Value Date/Time   WBC 11.8 (H) 03/08/2018 0538   RBC 4.84 03/08/2018 0538   HGB 11.0 (L) 03/08/2018 0538   HCT 36.4 (L) 03/08/2018 0538   PLT 361 03/08/2018 0538   MCV 75.2 (L) 03/08/2018 0538   MCH 22.7 (L) 03/08/2018 0538   MCHC 30.2 03/08/2018 0538   RDW 15.1 03/08/2018 0538   LYMPHSABS 1.9 12/30/2017 0858   MONOABS 0.7 12/30/2017 0858   EOSABS 0.1 12/30/2017 0858   BASOSABS 0.0 12/30/2017 0858    BMET    Component Value Date/Time   NA  141 09/17/2018 0841   NA 145 (H) 07/30/2018 1632   K 5.0 09/17/2018 0841   CL 102 09/17/2018 0841   CO2 30 09/17/2018 0841   GLUCOSE 145 (H) 09/17/2018 0841   BUN 14 09/17/2018 0841   BUN 15 07/30/2018 1632   CREATININE 0.91 09/17/2018 0841   CREATININE 0.84 11/30/2013 0856   CALCIUM 9.8 09/17/2018 0841   GFRNONAA 81 07/30/2018 1632   GFRNONAA >89 10/26/2013 0906   GFRAA 94 07/30/2018 1632   GFRAA >89 10/26/2013 0906    BNP No results found for: BNP  ProBNP No results found for: PROBNP    Assessment & Plan:    Nocturnal hypoxemia Assessment: 10/06/2018 overnight oximetry results showed the patient was below 88% for 25 minutes and 32 seconds  Plan: Continue 2 L of O2 at night Walk today in office patient completed 3 laps without any oxygen desaturations on room air  COPD GOLD II A  Assessment: 01/2018 CT showing severe centrilobular emphysema 01/2018 pulmonary function test showing an FEV1 of 54%, DLCO of 36 Not currently maintained on any inhalers Maintained on 2 L of O2 at night Previous walk test have not shown desaturations  Plan: Follow-up with our office in 3 to 6 months Present to our office sooner if shortness of breath worsens Continue 2 L of O2 at night Walk test in office today patient was able to  complete 3 laps on room air without any oxygen desaturations  Former cigarette smoker Assessment:  Quit smoking 2012 41-pack-year smoking history Recent high-res CT in 01/2018  Plan: Could consider referral to lung cancer screening program at next office visit     Lauraine Rinne, NP 10/12/2018   This appointment was 28 min long with over 50% of the time in direct face-to-face patient care, assessment, plan of care, and follow-up.

## 2018-10-12 ENCOUNTER — Telehealth: Payer: Self-pay | Admitting: Pulmonary Disease

## 2018-10-12 ENCOUNTER — Encounter: Payer: Self-pay | Admitting: Pulmonary Disease

## 2018-10-12 ENCOUNTER — Ambulatory Visit (INDEPENDENT_AMBULATORY_CARE_PROVIDER_SITE_OTHER): Payer: Medicare Other | Admitting: Pulmonary Disease

## 2018-10-12 VITALS — BP 124/82 | HR 71 | Ht 74.0 in | Wt 181.0 lb

## 2018-10-12 DIAGNOSIS — Z87891 Personal history of nicotine dependence: Secondary | ICD-10-CM

## 2018-10-12 DIAGNOSIS — I480 Paroxysmal atrial fibrillation: Secondary | ICD-10-CM | POA: Insufficient documentation

## 2018-10-12 DIAGNOSIS — G4734 Idiopathic sleep related nonobstructive alveolar hypoventilation: Secondary | ICD-10-CM | POA: Diagnosis not present

## 2018-10-12 DIAGNOSIS — J449 Chronic obstructive pulmonary disease, unspecified: Secondary | ICD-10-CM | POA: Diagnosis not present

## 2018-10-12 HISTORY — DX: Personal history of nicotine dependence: Z87.891

## 2018-10-12 HISTORY — DX: Paroxysmal atrial fibrillation: I48.0

## 2018-10-12 NOTE — Assessment & Plan Note (Signed)
Assessment:  Quit smoking 2012 41-pack-year smoking history Recent high-res CT in 01/2018  Plan: Could consider referral to lung cancer screening program at next office visit

## 2018-10-12 NOTE — Patient Instructions (Addendum)
Walk today in office to ensure her oxygen saturations are stable with exertion  Continue 2 L of O2 at night  Follow-up with Dr. Lake Bells in 3 to 6 months or sooner if symptoms worsen     It is flu season:   >>>Remember to be washing your hands regularly, using hand sanitizer, be careful to use around herself with has contact with people who are sick will increase her chances of getting sick yourself. >>> Best ways to protect herself from the flu: Receive the yearly flu vaccine, practice good hand hygiene washing with soap and also using hand sanitizer when available, eat a nutritious meals, get adequate rest, hydrate appropriately   Please contact the office if your symptoms worsen or you have concerns that you are not improving.   Thank you for choosing [facility] for your healthcare, and for allowing [place] to partner with you on your healthcare journey. I am thankful to be able to provide care to you today.   [name] FNP-C

## 2018-10-12 NOTE — Assessment & Plan Note (Addendum)
Assessment: 10/06/2018 overnight oximetry results showed the patient was below 88% for 25 minutes and 32 seconds  Plan: Continue 2 L of O2 at night Walk today in office patient completed 3 laps without any oxygen desaturations on room air

## 2018-10-12 NOTE — Assessment & Plan Note (Signed)
Assessment: 01/2018 CT showing severe centrilobular emphysema 01/2018 pulmonary function test showing an FEV1 of 54%, DLCO of 36 Not currently maintained on any inhalers Maintained on 2 L of O2 at night Previous walk test have not shown desaturations  Plan: Follow-up with our office in 3 to 6 months Present to our office sooner if shortness of breath worsens Continue 2 L of O2 at night Walk test in office today patient was able to complete 3 laps on room air without any oxygen desaturations

## 2018-10-12 NOTE — Telephone Encounter (Signed)
10/12/2018 1215  Please contact the patient and let them know that we received their overnight oximetry results.  It shows that the patient was below 88% for 25 minutes and 32 seconds while he was sleeping.  Patient needs to continue 2 L of O2 at night.  We have sent this office note as well as these results back over to patient's DME company.  Results are listed below: 10/06/2018-overnight oximetry-entire duration was 4 hours and 23 minutes, SPO2 less than 88% for 25 minutes and 32 seconds  Plan: Continue 2 L of O2 at night   [name], FNP

## 2018-10-12 NOTE — Telephone Encounter (Signed)
Patients wife is aware of results and verbalized understanding nothing further needed at this time.

## 2018-10-13 ENCOUNTER — Telehealth: Payer: Self-pay | Admitting: Pulmonary Disease

## 2018-10-13 DIAGNOSIS — G4734 Idiopathic sleep related nonobstructive alveolar hypoventilation: Secondary | ICD-10-CM

## 2018-10-13 NOTE — Telephone Encounter (Signed)
I completely disagree with the DME company's assessment of this patient.  The patient has known nocturnal hypoxia and is currently maintained on nightime oxygen from this DME company from a ONO in Sept/2019.  This same DME company has been managing this patient and reported to our office that we simply needed to requalify him with an overnight oximetry test.  The patient has been passed back and forth regarding this issue, leading to increased confusion regarding his plan of care.  The patient has known COPD proven on pulmonary function testing as well as known emphysema.  Patient has been intolerant of inhalers and showed no symptomatic improvement when he was tried on them.  I am not following the DME thought process regarding this.  We will need to speak with the DME company's representative regarding why they are no longer approving this as they currently approved it in September/2019.   Seems that we continue to get misinformation from his DME company regarding management of this patient.  I have contacted Sharyn Lull at the number listed below and left a message.  Requesting that [facility] contact our office back in either asked to speak with me, Lauren my nurse or a team lead such as Burman Nieves so we can come to an understanding how best to manage the patient.  Will route to Lauren and Burman Nieves as Juluis Rainier.   [name], FNP

## 2018-10-13 NOTE — Telephone Encounter (Signed)
New order placed removing the part of our O2 template that was causing the problem.  Med list is updated in chart, unsure why this needs to be faxed as [facility] has access to pt's chart.. Attached request to fax med list to new O2 order.  Nothing further needed at this time- will close encounter.

## 2018-10-13 NOTE — Telephone Encounter (Signed)
Selinda Eon [facility] [phone]  Needs a complete medications list Fax#[phone] atten; Sharyn Lull

## 2018-10-13 NOTE — Telephone Encounter (Signed)
Selinda Eon called back from [facility]. She received the medication list that was faxed over earlier, however the patient will not be qualified for O2. She wanted to know if the patient had tried any inhalers, I reviewed patient's chart all the way back to 2014 and did not see any inhalers.   Because of this, Selinda Eon stating that the patient will not qualify for any type of O2.   Spoke to [place] about this, he has asked me to route message to him. Selinda Eon can be reached at [phone] ext [phone].

## 2018-10-14 NOTE — Telephone Encounter (Signed)
Called and spoke with Melissa at [facility].  She is going to be looking into the chart and see if there is anything else that can be used.   The patient changed insurance to Medicare and medicare requires the OV, ONO, and a previous treatment of some sort.  Melissa will look into this and give [place] a call back.

## 2018-10-14 NOTE — Progress Notes (Signed)
Reviewed, agree 

## 2018-10-15 NOTE — Telephone Encounter (Signed)
Addended the note.  It is in the top part of the note next to maintenance: Hopefully this is sufficient so the patient can get the oxygen needs.  Thank you for following up with the DME company regarding this.[name], FNP

## 2018-10-15 NOTE — Telephone Encounter (Signed)
Called [facility] to update them that OV note has been addended. Levada Dy stated that [name] who is working on this patient's requalification is out today but is back tomorrow. I left phone number for a return call to make sure everything is approved prior to closing encounter.

## 2018-10-15 NOTE — Telephone Encounter (Signed)
Called and spoke to Domino at [facility]. Patient needs an addendum to office visit note that states patient tried and failed a respiratory medication for oxygen to be approved by Medicare.  Melissa stated this even could have been a sample that was given in office.   Routing to [name], NP.

## 2018-10-15 NOTE — Progress Notes (Signed)
Reviewed, agree needs O2 at night

## 2018-10-21 NOTE — Telephone Encounter (Signed)
10/21/2018 2103  [name]. Can we follow up with [name] and make sure they have everything they need to ensure the patient has nighttime oxygen?   [name] FNP

## 2018-10-22 NOTE — Telephone Encounter (Signed)
Called [facility] and spoke with Levada Dy and she advised I would need to speak with Lyndee Hensen with the re-qualification team [phone]) - I was transferred and left Sharyn Lull a VM and informed her when she calls back to ask for Daneil Dan or [facility].

## 2018-10-26 NOTE — Telephone Encounter (Signed)
ATC Michelle with [employer], The main line is down as they are switching over from [name]. Will try again later when phones are back up working.

## 2018-10-29 NOTE — Telephone Encounter (Signed)
Called and spoke to [place] due to phones at [employer] not working properly and to way to put in direct extension,Jenny said she will forward my message to [facility] to get back to our office regarding the overnight O2 whether it has or has not been approved for the patient

## 2018-11-03 ENCOUNTER — Other Ambulatory Visit: Payer: Self-pay | Admitting: Cardiology

## 2018-11-03 MED ORDER — HYDRALAZINE HCL 10 MG PO TABS: 10.0000 mg | ORAL_TABLET | Freq: Three times a day (TID) | ORAL | 0 refills | Status: DC

## 2018-11-03 NOTE — Telephone Encounter (Signed)
°  Patient is out of medication   1. Which medications need to be refilled? (please list name of each medication and dose if known) Hydralazine 10mg   2. Which pharmacy/location (including street and city if local pharmacy) is medication to be sent to? [facility] [address]  3. Do they need a 30 day or 90 day supply? [place]

## 2018-11-03 NOTE — Telephone Encounter (Signed)
Patient due for an appointment, 30 days sent to pharmacy

## 2018-11-05 NOTE — Telephone Encounter (Signed)
Called and spoke with pt to see if he was able to ever be started on nighttime O2 and pt stated that he was. Pt stated about 2 or 3 days after OV, pt was started on O2. Nothing further needed.

## 2018-11-16 ENCOUNTER — Other Ambulatory Visit: Payer: Self-pay | Admitting: Family

## 2018-11-25 ENCOUNTER — Other Ambulatory Visit: Payer: Self-pay | Admitting: Cardiology

## 2018-11-25 MED ORDER — HYDRALAZINE HCL 10 MG PO TABS: 10.0000 mg | ORAL_TABLET | Freq: Three times a day (TID) | ORAL | 0 refills | Status: DC

## 2018-11-25 NOTE — Telephone Encounter (Signed)
°*  STAT* If patient is at the pharmacy, call can be transferred to refill team.   1. Which medications need to be refilled? (please list name of each medication and dose if known) Hydralazine 10mg   2. Which pharmacy/location (including street and city if local pharmacy) is medication to be sent to? [facility] [address]  3. Do they need a 30 day or 90 day supply?  90 day

## 2018-12-08 ENCOUNTER — Other Ambulatory Visit: Payer: Self-pay | Admitting: Family

## 2018-12-09 ENCOUNTER — Telehealth: Payer: Self-pay | Admitting: Family

## 2018-12-09 ENCOUNTER — Other Ambulatory Visit: Payer: Self-pay | Admitting: Family

## 2018-12-09 NOTE — Telephone Encounter (Signed)
Copied from [place] [id number]. Topic: Quick Communication - See Telephone Encounter >> Dec 09, 2018  1:18 PM Ivar Drape wrote: CRM for notification. See Telephone encounter for: 12/09/18. Patient would like a refill on his lisinopril (PRINIVIL,ZESTRIL) tablet 20 mg medication and have it sent to [facility] pharmacy [facility] [address]. Patient took the last pill this morning.

## 2018-12-09 NOTE — Telephone Encounter (Signed)
Spoke with pt originally about his appointment on 12/14/18 and that we can still do the visit but it will be Virtual and what to expect with [employer] as pt has a samsung phone. After call was disconnected I saw his request for the Lisinopril and see that it was d/c'd in October 2019 due to elevated potassium level and he was changed to hydralazine. Attempted to reach pt and verify which medication he needs and whether or not he still takes the medication and left detailed message on voicemail to call us back with clarification.

## 2018-12-10 ENCOUNTER — Other Ambulatory Visit: Payer: Self-pay | Admitting: Family

## 2018-12-10 NOTE — Telephone Encounter (Signed)
Spoke with pt's spouse. She states pt was not aware that he was not supposed to be taking Lisinopril and has been taking it in addition to hydralazine and metoprolol until he just ran out of refills. Advised spouse per 05/2018 phone note and she voices understanding. Also pt asks in background if he is supposed to keep his appt on 12/14/18. I explained this to him yesterday that 4/20 visit would be a Virtual Visit on his android phone via [email or web address]. Explained process to his spouse and she voices understanding. Has pt's memory been assessed? He seems increasingly forgetful with recent phone interaction and past conversations.  CRM # [phone]  Owner: None  Status: Unresolved  Priority: Routine Created on: 12/10/2018 10:02 AM By: Richardo Priest, NT  Primary Information   Source Subject Topic  [name] (Patient) [name] (Patient) General - Other  Summary: advice  Reason for CRM: Patient's wife called in, with patient on the phone as well, and stated she would like some clarification due to them not knowing lisinopril had been discontinued. May call back to schedule follow up or ask further questions. [phone]

## 2018-12-10 NOTE — Telephone Encounter (Signed)
Noted.  Will address at 4/20 visit.

## 2018-12-10 NOTE — Telephone Encounter (Signed)
Received lisinopril request. This medication should have been discontinued. Refill refused.  Please notify pt.  Also, he is due for follow up. Please schedule a virtual visit.

## 2018-12-14 ENCOUNTER — Ambulatory Visit (INDEPENDENT_AMBULATORY_CARE_PROVIDER_SITE_OTHER): Payer: Medicare Other | Admitting: Family

## 2018-12-14 ENCOUNTER — Telehealth: Payer: Self-pay | Admitting: Family

## 2018-12-14 DIAGNOSIS — E1140 Type 2 diabetes mellitus with diabetic neuropathy, unspecified: Secondary | ICD-10-CM | POA: Diagnosis not present

## 2018-12-14 DIAGNOSIS — I2510 Atherosclerotic heart disease of native coronary artery without angina pectoris: Secondary | ICD-10-CM | POA: Diagnosis not present

## 2018-12-14 DIAGNOSIS — IMO0002 Reserved for concepts with insufficient information to code with codable children: Secondary | ICD-10-CM

## 2018-12-14 DIAGNOSIS — E1165 Type 2 diabetes mellitus with hyperglycemia: Secondary | ICD-10-CM

## 2018-12-14 DIAGNOSIS — I10 Essential (primary) hypertension: Secondary | ICD-10-CM | POA: Diagnosis not present

## 2018-12-14 DIAGNOSIS — E785 Hyperlipidemia, unspecified: Secondary | ICD-10-CM

## 2018-12-14 MED ORDER — INSULIN DETEMIR 100 UNIT/ML FLEXPEN: 45.0000 [IU] | PEN_INJECTOR | Freq: Every day | SUBCUTANEOUS | 11 refills | Status: DC

## 2018-12-14 NOTE — Telephone Encounter (Signed)
Called [name] to set up his follow up office appt and lab. No answer so I left him a detailed message

## 2018-12-14 NOTE — Progress Notes (Signed)
Virtual Visit via Video Note  I connected with Burnett Corrente on 12/14/18 at  9:40 AM EDT by a video enabled telemedicine application and verified that I am speaking with the correct person using two identifiers. This visit type was conducted due to national recommendations for restrictions regarding the COVID-19 Pandemic (e.g. social distancing).  This format is felt to be most appropriate for this patient at this time.   I discussed the limitations of evaluation and management by telemedicine and the availability of in person appointments. The patient expressed understanding and agreed to proceed.  Only the patient and myself were on today's video visit. The patient was at home and I was at home at the time of today's visit.   History of Present Illness:  HTN- bp 124/78. Continues hydralazine tid, metoprolol 50mg  BID. Stopped lisinopril. Denies LE swelling, cp or sob.   BP Readings from Last 3 Encounters:  10/12/18 124/82  09/08/18 138/78  09/01/18 130/84   DM2- reports that his sugar this AM was 107. Uses novolog 14 units AC dinner.  He also continues levemir 45 units. Denies hypoglycemia.    Lab Results  Component Value Date   HGBA1C 7.8 (H) 09/08/2018   HGBA1C 7.3 (H) 06/10/2018   HGBA1C 7.3 (H) 03/02/2018   Lab Results  Component Value Date   MICROALBUR <0.7 09/08/2018   LDLCALC 72 06/10/2018   CREATININE 0.91 09/17/2018   Hyperlipidemia- continues crestor. Denies myalgia.  Lab Results  Component Value Date   CHOL 133 06/10/2018   HDL 47.80 06/10/2018   LDLCALC 72 06/10/2018   LDLDIRECT 177.6 10/07/2007   TRIG 63.0 06/10/2018   CHOLHDL 3 06/10/2018       Observations/Objective:  Gen: Awake, alert, no acute distress Resp: Breathing is even and non-labored Psych: calm/pleasant demeanor Neuro: Alert and Oriented x 3, + facial symmetry, speech is clear.   Assessment and Plan: DM2- sugars clinically stable. Continue current meds/doses.  HTN- bp looks ok off  of lisinopril. Pt advised to remain off due to hx of hyperkalemia.  Hyperlipidemia- LDL at goal, continue statin.   Follow Up Instructions:    I discussed the assessment and treatment plan with the patient. The patient was provided an opportunity to ask questions and all were answered. The patient agreed with the plan and demonstrated an understanding of the instructions.   The patient was advised to call back or seek an in-person evaluation if the symptoms worsen or if the condition fails to improve as anticipated.    Nance Pear, NP

## 2018-12-16 NOTE — Telephone Encounter (Signed)
Med was denied, patient was seen

## 2018-12-28 ENCOUNTER — Other Ambulatory Visit: Payer: Self-pay | Admitting: Cardiology

## 2018-12-30 NOTE — Progress Notes (Deleted)
Virtual Visit via Video Note  I connected with patient on 12/31/18 at  2:00 PM EDT by a video enabled telemedicine application and verified that I am speaking with the correct person using two identifiers.   THIS ENCOUNTER IS A VIRTUAL VISIT DUE TO COVID-19 - PATIENT WAS NOT SEEN IN THE OFFICE. PATIENT HAS CONSENTED TO VIRTUAL VISIT / TELEMEDICINE VISIT   Location of patient: home  Location of provider: office  I discussed the limitations of evaluation and management by telemedicine and the availability of in person appointments. The patient expressed understanding and agreed to proceed.   Subjective:   [name] is a 68 y.o. male who presents for an Initial Medicare Annual Wellness Visit.  Review of Systems No ROS.  Medicare Wellness Virtual Visit.  Visual/audio telehealth visit, UTA vital signs.   See social history for additional risk factors.   Sleep patterns:  Home Safety/Smoke Alarms: Feels safe in home. Smoke alarms in place.    Male:   CCS- 11/20/16.  Recall 3 yrs.  PSA-  Lab Results  Component Value Date   PSA 0.26 09/08/2018   PSA 0.29 03/02/2015   PSA 0.24 10/07/2007      Objective:    Unable to assess vitals. This visit is enabled though telemedicine due to Covid 19.   Advanced Directives 03/09/2018 03/04/2018 03/02/2018 01/13/2018 11/20/2016 11/06/2016  Does Patient Have a Medical Advance Directive? No No No No No No  Would patient like information on creating a medical advance directive? No - Patient declined No - Patient declined No - Patient declined Yes (MAU/Ambulatory/Procedural Areas - Information given) - -    Current Medications (verified) Outpatient Encounter Medications as of 12/31/2018  Medication Sig  . aspirin EC 325 MG EC tablet Take 1 tablet (325 mg total) by mouth daily.  . Cyanocobalamin (VITAMIN B-12 PO) Take 1 tablet by mouth daily.  . dapagliflozin propanediol (FARXIGA) 5 MG TABS tablet Take 5 mg by mouth daily.  . furosemide (LASIX)  20 MG tablet TAKE 1 TABLET BY MOUTH EVERY DAY  . glucose blood (PRODIGY NO CODING BLOOD GLUC) test strip Use as instructed to check blood sugar twice a day.  DX  E11.40  . hydrALAZINE (APRESOLINE) 10 MG tablet TAKE 1 TABLET BY MOUTH THREE TIMES A DAY  . insulin aspart (NOVOLOG FLEXPEN) 100 UNIT/ML FlexPen INJECT 11-15 SUBCUTANEOUSLY DAILY BEFORE SUPPER  . Insulin Detemir (LEVEMIR FLEXTOUCH) 100 UNIT/ML Pen Inject 45 Units into the skin daily.  . Insulin Pen Needle (B-D ULTRAFINE III SHORT PEN) 31G X 8 MM MISC USE AS DIRECTED  . metFORMIN (GLUCOPHAGE-XR) 500 MG 24 hr tablet Take 2 tablets (1,000 mg total) by mouth 2 (two) times daily.  . metoprolol tartrate (LOPRESSOR) 50 MG tablet Take 1 tablet (50 mg total) by mouth 2 (two) times daily.  . rosuvastatin (CRESTOR) 20 MG tablet Take 1 tablet (20 mg total) by mouth daily.   No facility-administered encounter medications on file as of 12/31/2018.     Allergies (verified) Amlodipine besylate; Hydrochlorothiazide; and Lisinopril   History: Past Medical History:  Diagnosis Date  . Abnormal myocardial perfusion study 2006   EF 44% ? inferoseptal ischemia. no cath done  . Breast mass, right 03/2008  . Contact dermatitis 01/31/2013  . COPD (chronic obstructive pulmonary disease) ([place])    "dx'd 12/2017"  . Coronary artery disease   . Heart murmur   . History of colonic polyps   . Hyperlipidemia   . Hypertension   .  Low back pain   . Type II diabetes mellitus ([place])    Past Surgical History:  Procedure Laterality Date  . ABDOMINAL AORTOGRAM N/A 01/13/2018   Procedure: ABDOMINAL AORTOGRAM;  Surgeon: Troy Sine, MD;  Location: [facility];  Service: Cardiovascular;  Laterality: N/A;  . AORTIC VALVE REPLACEMENT N/A 03/04/2018   Procedure: AORTIC VALVE REPLACEMENT (AVR) 85mm Edwards Magna Ease Tissue Valve.;  Surgeon: Melrose Nakayama, MD;  Location: [facility];  Service: Open Heart Surgery;  Laterality: N/A;  . CARDIAC VALVE REPLACEMENT     . COLONOSCOPY W/ POLYPECTOMY    . CORONARY ARTERY BYPASS GRAFT N/A 03/04/2018   Procedure: CORONARY ARTERY BYPASS GRAFTING (CABG) x1:  LIMA to LAD.;  Surgeon: Melrose Nakayama, MD;  Location: [facility];  Service: Open Heart Surgery;  Laterality: N/A;  . LIPOMA EXCISION Left 07/2008    "back of shoulder" Dr Gershon Crane  . RIGHT/LEFT HEART CATH AND CORONARY ANGIOGRAPHY N/A 01/13/2018   Procedure: RIGHT/LEFT HEART CATH AND CORONARY ANGIOGRAPHY;  Surgeon: Troy Sine, MD;  Location: [facility];  Service: Cardiovascular;  Laterality: N/A;  . TEE WITHOUT CARDIOVERSION N/A 03/04/2018   Procedure: TRANSESOPHAGEAL ECHOCARDIOGRAM (TEE);  Surgeon: Melrose Nakayama, MD;  Location: [facility];  Service: Open Heart Surgery;  Laterality: N/A;  . TONSILLECTOMY    . WISDOM TOOTH EXTRACTION     Family History  Problem Relation Age of Onset  . Melanoma Father 28       deceased secondary to melanoma  . Alzheimer's disease Father   . Diabetes Father   . Hypertension Mother        alive -70  . Colon cancer Neg Hx    Social History   Socioeconomic History  . Marital status: Married    Spouse name: Not on file  . Number of children: Not on file  . Years of education: Not on file  . Highest education level: Not on file  Occupational History  . Not on file  Social Needs  . Financial resource strain: Not on file  . Food insecurity:    Worry: Not on file    Inability: Not on file  . Transportation needs:    Medical: Not on file    Non-medical: Not on file  Tobacco Use  . Smoking status: Former Smoker    Packs/day: 1.00    Years: 41.00    Pack years: 41.00    Types: Cigarettes    Last attempt to quit: 08/26/2010    Years since quitting: 8.3  . Smokeless tobacco: Never Used  . Tobacco comment: Quit 2012  Substance and Sexual Activity  . Alcohol use: Yes    Alcohol/week: 3.0 standard drinks    Types: 3 Glasses of wine per week  . Drug use: Not Currently  . Sexual activity: Not Currently   Lifestyle  . Physical activity:    Days per week: Not on file    Minutes per session: Not on file  . Stress: Not on file  Relationships  . Social connections:    Talks on phone: Not on file    Gets together: Not on file    Attends religious service: Not on file    Active member of club or organization: Not on file    Attends meetings of clubs or organizations: Not on file    Relationship status: Not on file  Other Topics Concern  . 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 - Y  Comment - "a little"  Vision? - N  Difficulty concentrating or making decisions? - N  Walking or climbing stairs? - N  Dressing or bathing? - N  Doing errands, shopping? N -  Some recent data might be hidden     Immunizations and Health Maintenance Immunization History  Administered Date(s) Administered  . Influenza Split 08/04/2012  . Influenza Whole 05/20/2008, 07/12/2009, 05/02/2010  . Influenza, High Dose Seasonal PF 05/20/2017, 06/10/2018  . Influenza,inj,Quad PF,6+ Mos 05/07/2013, 05/09/2014, 06/07/2015, 05/01/2016  . Pneumococcal Conjugate-13 02/05/2017  . Pneumococcal Polysaccharide-23 05/20/2008, 08/25/2018  . Td 08/26/2008   Health Maintenance Due  Topic Date Due  . OPHTHALMOLOGY EXAM  08/27/2018  . TETANUS/TDAP  10/10/2018    Patient Care Team: Debbrah Alar, NP as PCP - General (Internal Medicine) Park Liter, MD as PCP - Cardiology (Cardiology) Philemon Kingdom, MD as Consulting Physician (Internal Medicine)  Indicate any recent Medical Services you  may have received from other than [facility] providers in the past year (date may be approximate).    Assessment:   This is a routine wellness examination for Nori. Physical assessment deferred to PCP.  Hearing/Vision screen Unable to assess. This visit is enabled though telemedicine due to Covid 19.   Dietary issues and exercise activities discussed: Diet (meal preparation, eat out, water intake, caffeinated beverages, dairy products, fruits and vegetables): {Desc; diets:16563} Breakfast: Lunch:  Dinner:         Goals   None    Depression Screen PHQ 2/9 Scores 08/05/2018 05/06/2018 03/12/2018 02/05/2017  PHQ - 2 Score 0 0 0 1  PHQ- 9 Score - - 1 2    Fall Risk Fall Risk  03/12/2018 02/05/2017 02/05/2017  Falls in the past year? No No No    Cognitive Function: Ad8 score reviewed for issues:  Issues making decisions:  Less interest in hobbies / activities:  Repeats questions, stories (family complaining):  Trouble using ordinary gadgets (microwave, computer, phone):  Forgets the month or year:   Mismanaging finances:   Remembering appts:  Daily problems with thinking and/or memory: Ad8 score is=         Screening Tests Health Maintenance  Topic Date Due  . OPHTHALMOLOGY EXAM  08/27/2018  . TETANUS/TDAP  10/10/2018  . HEMOGLOBIN A1C  03/09/2019  . INFLUENZA VACCINE  03/27/2019  . FOOT EXAM  09/09/2019  . COLONOSCOPY  11/21/2019  . Hepatitis C Screening  Completed  . PNA vac Low Risk Adult  Completed       Plan:   ***  I have personally reviewed and noted the following in the patient's chart:   . Medical and social history . Use of alcohol, tobacco or illicit drugs  . Current medications and supplements . Functional ability and status . Nutritional status . Physical activity . Advanced directives . List of other physicians . Hospitalizations, surgeries, and ER visits in previous 12 months . Vitals .  Screenings to include cognitive, depression, and  falls . Referrals and appointments  In addition, I have reviewed and discussed with patient certain preventive protocols, quality metrics, and best practice recommendations. A written personalized care plan for preventive services as well as general preventive health recommendations were provided to patient.     Shela Nevin, [place]   12/30/2018

## 2018-12-31 ENCOUNTER — Ambulatory Visit: Payer: Medicare Other | Admitting: *Deleted

## 2018-12-31 ENCOUNTER — Other Ambulatory Visit: Payer: Self-pay

## 2019-01-11 ENCOUNTER — Ambulatory Visit: Payer: Medicare Other | Admitting: Pulmonary Disease

## 2019-01-12 ENCOUNTER — Other Ambulatory Visit: Payer: Self-pay | Admitting: Family

## 2019-01-27 ENCOUNTER — Telehealth: Payer: Self-pay | Admitting: Cardiology

## 2019-01-27 NOTE — Telephone Encounter (Signed)
Virtual Visit Pre-Appointment Phone Call  "(Name), I am calling you today to discuss your upcoming appointment. We are currently trying to limit exposure to the virus that causes COVID-19 by seeing patients at home rather than in the office."  1. "What is the BEST phone number to call the day of the visit?" - include this in appointment notes  2. Do you have or have access to (through a family member/friend) a smartphone with video capability that we can use for your visit?" a. If yes - list this number in appt notes as cell (if different from BEST phone #) and list the appointment type as a VIDEO visit in appointment notes b. If no - list the appointment type as a PHONE visit in appointment notes  3. Confirm consent - "In the setting of the current Covid19 crisis, you are scheduled for a (phone or video) visit with your provider on (date) at (time).  Just as we do with many in-office visits, in order for you to participate in this visit, we must obtain consent.  If you'd like, I can send this to your mychart (if signed up) or email for you to review.  Otherwise, I can obtain your verbal consent now.  All virtual visits are billed to your insurance company just like a normal visit would be.  By agreeing to a virtual visit, we'd like you to understand that the technology does not allow for your provider to perform an examination, and thus may limit your provider's ability to fully assess your condition. If your provider identifies any concerns that need to be evaluated in person, we will make arrangements to do so.  Finally, though the technology is pretty good, we cannot assure that it will always work on either your or our end, and in the setting of a video visit, we may have to convert it to a phone-only visit.  In either situation, we cannot ensure that we have a secure connection.  Are you willing to proceed?" STAFF: Did the patient verbally acknowledge consent to telehealth visit? Document  YES/NO here: Yes per spouse  4. Advise patient to be prepared - "Two hours prior to your appointment, go ahead and check your blood pressure, pulse, oxygen saturation, and your weight (if you have the equipment to check those) and write them all down. When your visit starts, your provider will ask you for this information. If you have an Apple Watch or Kardia device, please plan to have heart rate information ready on the day of your appointment. Please have a pen and paper handy nearby the day of the visit as well."  5. Give patient instructions for MyChart download to smartphone OR Doximity/Doxy.me as below if video visit (depending on what platform provider is using)  6. Inform patient they will receive a phone call 15 minutes prior to their appointment time (may be from unknown caller ID) so they should be prepared to answer    TELEPHONE CALL NOTE  [name] has been deemed a candidate for a follow-up tele-health visit to limit community exposure during the Covid-19 pandemic. I spoke with the patient via phone to ensure availability of phone/video source, confirm preferred email & phone number, and discuss instructions and expectations.  I reminded [name] to be prepared with any vital sign and/or heart rhythm information that could potentially be obtained via home monitoring, at the time of his visit. I reminded [name] to expect a phone call  prior to his visit.  Calla Kicks 01/27/2019 10:58 AM   INSTRUCTIONS FOR DOWNLOADING THE MYCHART APP TO SMARTPHONE  - The patient must first make sure to have activated MyChart and know their login information - If Apple, go to [employer] and type in MyChart in the search bar and download the app. If Android, ask patient to go to [employer] and type in [place] in the search bar and download the app. The app is free but as with any other app downloads, their phone may require them to verify saved payment information  or Apple/Android password.  - The patient will need to then log into the app with their MyChart username and password, and select [facility] as their healthcare provider to link the account. When it is time for your visit, go to the MyChart app, find appointments, and click Begin Video Visit. Be sure to Select Allow for your device to access the Microphone and Camera for your visit. You will then be connected, and your provider will be with you shortly.  **If they have any issues connecting, or need assistance please contact MyChart service desk [phone])**  **If using a computer, in order to ensure the best quality for their visit they will need to use either of the following Internet Browsers: [employer], or Google Chrome**  IF USING DOXIMITY or DOXY.ME - The patient will receive a link just prior to their visit by text.     FULL LENGTH CONSENT FOR TELE-HEALTH VISIT   I hereby voluntarily request, consent and authorize [facility] and its employed or contracted physicians, physician assistants, nurse practitioners or other licensed health care professionals (the Practitioner), to provide me with telemedicine health care services (the Services") as deemed necessary by the treating Practitioner. I acknowledge and consent to receive the Services by the Practitioner via telemedicine. I understand that the telemedicine visit will involve communicating with the Practitioner through live audiovisual communication technology and the disclosure of certain medical information by electronic transmission. I acknowledge that I have been given the opportunity to request an in-person assessment or other available alternative prior to the telemedicine visit and am voluntarily participating in the telemedicine visit.  I understand that I have the right to withhold or withdraw my consent to the use of telemedicine in the course of my care at any time, without affecting my right to future  care or treatment, and that the Practitioner or I may terminate the telemedicine visit at any time. I understand that I have the right to inspect all information obtained and/or recorded in the course of the telemedicine visit and may receive copies of available information for a reasonable fee.  I understand that some of the potential risks of receiving the Services via telemedicine include:   Delay or interruption in medical evaluation due to technological equipment failure or disruption;  Information transmitted may not be sufficient (e.g. poor resolution of images) to allow for appropriate medical decision making by the Practitioner; and/or   In rare instances, security protocols could fail, causing a breach of personal health information.  Furthermore, I acknowledge that it is my responsibility to provide information about my medical history, conditions and care that is complete and accurate to the best of my ability. I acknowledge that Practitioner's advice, recommendations, and/or decision may be based on factors not within their control, such as incomplete or inaccurate data provided by me or distortions of diagnostic images or specimens that may result from electronic  transmissions. I understand that the practice of medicine is not an exact science and that Practitioner makes no warranties or guarantees regarding treatment outcomes. I acknowledge that I will receive a copy of this consent concurrently upon execution via email to the email address I last provided but may also request a printed copy by calling the office of [facility].    I understand that my insurance will be billed for this visit.   I have read or had this consent read to me.  I understand the contents of this consent, which adequately explains the benefits and risks of the Services being provided via telemedicine.   I have been provided ample opportunity to ask questions regarding this consent and the Services and have  had my questions answered to my satisfaction.  I give my informed consent for the services to be provided through the use of telemedicine in my medical care  By participating in this telemedicine visit I agree to the above.

## 2019-01-30 ENCOUNTER — Other Ambulatory Visit: Payer: Self-pay | Admitting: Cardiology

## 2019-02-03 ENCOUNTER — Telehealth (INDEPENDENT_AMBULATORY_CARE_PROVIDER_SITE_OTHER): Payer: Medicare Other | Admitting: Cardiology

## 2019-02-03 ENCOUNTER — Other Ambulatory Visit: Payer: Self-pay

## 2019-02-03 ENCOUNTER — Encounter: Payer: Self-pay | Admitting: Cardiology

## 2019-02-03 VITALS — BP 140/78

## 2019-02-03 DIAGNOSIS — Z951 Presence of aortocoronary bypass graft: Secondary | ICD-10-CM

## 2019-02-03 DIAGNOSIS — Z952 Presence of prosthetic heart valve: Secondary | ICD-10-CM | POA: Diagnosis not present

## 2019-02-03 DIAGNOSIS — I10 Essential (primary) hypertension: Secondary | ICD-10-CM | POA: Diagnosis not present

## 2019-02-03 DIAGNOSIS — E782 Mixed hyperlipidemia: Secondary | ICD-10-CM

## 2019-02-03 NOTE — Patient Instructions (Signed)
Medication Instructions:  Your physician recommends that you continue on your current medications as directed. Please refer to the Current Medication list given to you today.  If you need a refill on your cardiac medications before your next appointment, please call your pharmacy.   Lab work: None.  If you have labs (blood work) drawn today and your tests are completely normal, you will receive your results only by: . MyChart Message (if you have MyChart) OR . A paper copy in the mail If you have any lab test that is abnormal or we need to change your treatment, we will call you to review the results.  Testing/Procedures: Your physician has requested that you have an echocardiogram. Echocardiography is a painless test that uses sound waves to create images of your heart. It provides your doctor with information about the size and shape of your heart and how well your heart's chambers and valves are working. This procedure takes approximately one hour. There are no restrictions for this procedure.    Follow-Up: At [facility], you and your health needs are our priority.  As part of our continuing mission to provide you with exceptional heart care, we have created designated Provider Care Teams.  These Care Teams include your primary Cardiologist (physician) and Advanced Practice Providers (APPs -  Physician Assistants and Nurse Practitioners) who all work together to provide you with the care you need, when you need it. You will need a follow up appointment in 6 months.  Please call our office 2 months in advance to schedule this appointment.  You may see Robert Krasowski, MD or another member of our [facility] Provider Team in [place]: Brian Munley, MD . Rajan Revankar, MD  Any Other Special Instructions Will Be Listed Below (If Applicable).   Echocardiogram An echocardiogram is a procedure that uses painless sound waves (ultrasound) to produce an image of the heart. Images from  an echocardiogram can provide important information about:  Signs of coronary artery disease (CAD).  Aneurysm detection. An aneurysm is a weak or damaged part of an artery wall that bulges out from the normal force of blood pumping through the body.  Heart size and shape. Changes in the size or shape of the heart can be associated with certain conditions, including heart failure, aneurysm, and CAD.  Heart muscle function.  Heart valve function.  Signs of a past heart attack.  Fluid buildup around the heart.  Thickening of the heart muscle.  A tumor or infectious growth around the heart valves. Tell a health care provider about:  Any allergies you have.  All medicines you are taking, including vitamins, herbs, eye drops, creams, and over-the-counter medicines.  Any blood disorders you have.  Any surgeries you have had.  Any medical conditions you have.  Whether you are pregnant or may be pregnant. What are the risks? Generally, this is a safe procedure. However, problems may occur, including:  Allergic reaction to dye (contrast) that may be used during the procedure. What happens before the procedure? No specific preparation is needed. You may eat and drink normally. What happens during the procedure?   An IV tube may be inserted into one of your veins.  You may receive contrast through this tube. A contrast is an injection that improves the quality of the pictures from your heart.  A gel will be applied to your chest.  A wand-like tool (transducer) will be moved over your chest. The gel will help to transmit the sound   waves from the transducer.  The sound waves will harmlessly bounce off of your heart to allow the heart images to be captured in real-time motion. The images will be recorded on a computer. The procedure may vary among health care providers and hospitals. What happens after the procedure?  You may return to your normal, everyday life, including diet,  activities, and medicines, unless your health care provider tells you not to do that. Summary  An echocardiogram is a procedure that uses painless sound waves (ultrasound) to produce an image of the heart.  Images from an echocardiogram can provide important information about the size and shape of your heart, heart muscle function, heart valve function, and fluid buildup around your heart.  You do not need to do anything to prepare before this procedure. You may eat and drink normally.  After the echocardiogram is completed, you may return to your normal, everyday life, unless your health care provider tells you not to do that. This information is not intended to replace advice given to you by your health care provider. Make sure you discuss any questions you have with your health care provider. Document Released: 08/09/2000 Document Revised: 09/14/2016 Document Reviewed: 09/14/2016 Elsevier Interactive Patient Education  2019 [employer].

## 2019-02-03 NOTE — Progress Notes (Signed)
Virtual Visit via Telephone Note   This visit type was conducted due to national recommendations for restrictions regarding the COVID-19 Pandemic (e.g. social distancing) in an effort to limit this patient's exposure and mitigate transmission in our community.  Due to his co-morbid illnesses, this patient is at least at moderate risk for complications without adequate follow up.  This format is felt to be most appropriate for this patient at this time.  The patient did not have access to video technology/had technical difficulties with video requiring transitioning to audio format only (telephone).  All issues noted in this document were discussed and addressed.  No physical exam could be performed with this format.  Please refer to the patient's chart for his  consent to telehealth for [facility].  Evaluation Performed:  Follow-up visit  This visit type was conducted due to national recommendations for restrictions regarding the COVID-19 Pandemic (e.g. social distancing).  This format is felt to be most appropriate for this patient at this time.  All issues noted in this document were discussed and addressed.  No physical exam was performed (except for noted visual exam findings with Video Visits).  Please refer to the patient's chart (MyChart message for video visits and phone note for telephone visits) for the patient's consent to telehealth for [facility]  Date:  02/03/2019  ID: [name], DOB 07/26/1951, MRN [id number]   Patient Location: [place] [address]   Provider location:   [place] Office  PCP:  Debbrah Alar, NP  Cardiologist:  Jenne Campus, MD     Chief Complaint: Doing very well  History of Present Illness:    [name] is a 68 y.o. male  who presents via audio/video conferencing for a telehealth visit today.  With history of aortic valve replacement, single-vessel bypass graft done more than year ago.   Overall he is doing well denies having a chest pain tightness squeezing pressure burning chest.  He is diabetes is well controlled he is working back in the garden.  Overall seems to be doing well   The patient does not have symptoms concerning for COVID-19 infection (fever, chills, cough, or new SHORTNESS OF BREATH).    Prior CV studies:   The following studies were reviewed today:       Past Medical History:  Diagnosis Date  . Abnormal myocardial perfusion study 2006   EF 44% ? inferoseptal ischemia. no cath done  . Breast mass, right 03/2008  . Contact dermatitis 01/31/2013  . COPD (chronic obstructive pulmonary disease) ([place])    "dx'd 12/2017"  . Coronary artery disease   . Heart murmur   . History of colonic polyps   . Hyperlipidemia   . Hypertension   . Low back pain   . Type II diabetes mellitus ([place])     Past Surgical History:  Procedure Laterality Date  . ABDOMINAL AORTOGRAM N/A 01/13/2018   Procedure: ABDOMINAL AORTOGRAM;  Surgeon: Troy Sine, MD;  Location: [place] [facility];  Service: Cardiovascular;  Laterality: N/A;  . AORTIC VALVE REPLACEMENT N/A 03/04/2018   Procedure: AORTIC VALVE REPLACEMENT (AVR) 89mm Edwards Magna Ease Tissue Valve.;  Surgeon: Melrose Nakayama, MD;  Location: [place];  Service: Open Heart Surgery;  Laterality: N/A;  . CARDIAC VALVE REPLACEMENT    . COLONOSCOPY W/ POLYPECTOMY    . CORONARY ARTERY BYPASS GRAFT N/A 03/04/2018   Procedure: CORONARY ARTERY BYPASS GRAFTING (CABG) x1:  LIMA to  LAD.;  Surgeon: Melrose Nakayama, MD;  Location: [place];  Service: Open Heart Surgery;  Laterality: N/A;  . LIPOMA EXCISION Left 07/2008    "back of shoulder" Dr Gershon Crane  . RIGHT/LEFT HEART CATH AND CORONARY ANGIOGRAPHY N/A 01/13/2018   Procedure: RIGHT/LEFT HEART CATH AND CORONARY ANGIOGRAPHY;  Surgeon: Troy Sine, MD;  Location: [place] [facility];  Service: Cardiovascular;  Laterality: N/A;  . TEE WITHOUT CARDIOVERSION N/A 03/04/2018    Procedure: TRANSESOPHAGEAL ECHOCARDIOGRAM (TEE);  Surgeon: Melrose Nakayama, MD;  Location: [place];  Service: Open Heart Surgery;  Laterality: N/A;  . TONSILLECTOMY    . WISDOM TOOTH EXTRACTION       Current Meds  Medication Sig  . aspirin EC 325 MG EC tablet Take 1 tablet (325 mg total) by mouth daily.  . Cyanocobalamin (VITAMIN B-12 PO) Take 1 tablet by mouth daily.  . dapagliflozin propanediol (FARXIGA) 5 MG TABS tablet Take 5 mg by mouth daily.  . furosemide (LASIX) 20 MG tablet TAKE 1 TABLET BY MOUTH EVERY DAY  . glucose blood (PRODIGY NO CODING BLOOD GLUC) test strip Use as instructed to check blood sugar twice a day.  DX  E11.40  . hydrALAZINE (APRESOLINE) 10 MG tablet TAKE 1 TABLET BY MOUTH THREE TIMES A DAY  . insulin aspart (NOVOLOG FLEXPEN) 100 UNIT/ML FlexPen INJECT 11-15 SUBCUTANEOUSLY DAILY BEFORE SUPPER  . Insulin Pen Needle (B-D ULTRAFINE III SHORT PEN) 31G X 8 MM MISC USE AS DIRECTED  . LEVEMIR FLEXTOUCH 100 UNIT/ML Pen INJECT 60 UNITS INTO THE SKIN DAILY AT 10 PM.  . metFORMIN (GLUCOPHAGE-XR) 500 MG 24 hr tablet Take 2 tablets (1,000 mg total) by mouth 2 (two) times daily.  . metoprolol tartrate (LOPRESSOR) 50 MG tablet Take 1 tablet (50 mg total) by mouth 2 (two) times daily.  . rosuvastatin (CRESTOR) 20 MG tablet Take 1 tablet (20 mg total) by mouth daily.      Family History: The patient's family history includes Alzheimer's disease in his father; Diabetes in his father; Hypertension in his mother; Melanoma (age of onset: 45) in his father. There is no history of Colon cancer.   ROS:   Please see the history of present illness.     All other systems reviewed and are negative.   Labs/Other Tests and Data Reviewed:     Recent Labs: 03/08/2018: Hemoglobin 11.0; Platelets 361 06/10/2018: ALT 7; TSH 1.55 07/30/2018: Magnesium 1.8 09/17/2018: BUN 14; Creatinine, Ser 0.91; Potassium 5.0; Sodium 141  Recent Lipid Panel    Component Value Date/Time   CHOL 133  06/10/2018 1059   TRIG 63.0 06/10/2018 1059   HDL 47.80 06/10/2018 1059   CHOLHDL 3 06/10/2018 1059   VLDL 12.6 06/10/2018 1059   LDLCALC 72 06/10/2018 1059   LDLDIRECT 177.6 10/07/2007 1001      Exam:    Vital Signs:  BP 140/78     Wt Readings from Last 3 Encounters:  10/12/18 181 lb (82.1 kg)  09/08/18 181 lb (82.1 kg)  09/01/18 178 lb (80.7 kg)     Well nourished, well developed in no acute distress. Alert awake oriented on 3 talking to me over the phone.  He had no technical ability to establish video link overall asymptomatic  Diagnosis for this visit:   1. S/P AVR   2. Status post coronary artery bypass graft   3. Mixed hyperlipidemia   4. Essential hypertension      ASSESSMENT & PLAN:    1.  Status post aortic valve replacement functionally he is doing well I will schedule him to have an echocardiogram done in September. 2.  Status post coronary artery bypass graft doing well no chest pain 3.  Dyslipidemia will contact his primary care physician to get his fasting lipid profile 4.  Essential hypertension blood pressure well controlled  COVID-19 Education: The signs and symptoms of COVID-19 were discussed with the patient and how to seek care for testing (follow up with PCP or arrange E-visit).  The importance of social distancing was discussed today.  Patient Risk:   After full review of this patients clinical status, I feel that they are at least moderate risk at this time.  Time:   Today, I have spent 15 minutes with the patient with telehealth technology discussing pt health issues.  I spent 5 minutes reviewing her chart before the visit.  Visit was finished at 10:30 AM.    Medication Adjustments/Labs and Tests Ordered: Current medicines are reviewed at length with the patient today.  Concerns regarding medicines are outlined above.  No orders of the defined types were placed in this encounter.  Medication changes: No orders of the defined types were  placed in this encounter.    Disposition: Follow-up 6 months  Signed, Park Liter, MD, [facility] 02/03/2019 11:07 AM    [place]

## 2019-02-03 NOTE — Addendum Note (Signed)
Addended by: Ashok Norris on: 02/03/2019 11:29 AM   Modules accepted: Orders

## 2019-02-08 ENCOUNTER — Other Ambulatory Visit: Payer: Self-pay | Admitting: *Deleted

## 2019-02-08 ENCOUNTER — Other Ambulatory Visit: Payer: Self-pay | Admitting: Cardiology

## 2019-03-05 ENCOUNTER — Other Ambulatory Visit: Payer: Self-pay | Admitting: Family

## 2019-03-11 ENCOUNTER — Other Ambulatory Visit: Payer: Self-pay | Admitting: Family

## 2019-03-12 ENCOUNTER — Telehealth: Payer: Self-pay | Admitting: *Deleted

## 2019-03-12 MED ORDER — HYDRALAZINE HCL 10 MG PO TABS: 10.0000 mg | ORAL_TABLET | Freq: Three times a day (TID) | ORAL | 3 refills | Status: DC

## 2019-03-12 NOTE — Telephone Encounter (Signed)
*  STAT* If patient is at the pharmacy, call can be transferred to refill team.   1. Which medications need to be refilled? (please list name of each medication and dose if known) Hydralazine 10 mg tid  2. Which pharmacy/location (including street and city if local pharmacy) is medication to be sent to?[facility] on [address]  3. Do they need a 30 day or 90 day supply? [place]

## 2019-03-13 ENCOUNTER — Other Ambulatory Visit: Payer: Self-pay | Admitting: Family

## 2019-03-18 NOTE — Progress Notes (Addendum)
Virtual Visit via Video Note  I connected with patient on 03/22/19 at  9:00 AM EDT by audio enabled telemedicine application and verified that I am speaking with the correct person using two identifiers.   THIS ENCOUNTER IS A VIRTUAL VISIT DUE TO COVID-19 - PATIENT WAS NOT SEEN IN THE OFFICE. PATIENT HAS CONSENTED TO VIRTUAL VISIT / TELEMEDICINE VISIT   Location of patient: home  Location of provider: office  I discussed the limitations of evaluation and management by telemedicine and the availability of in person appointments. The patient expressed understanding and agreed to proceed.   Subjective:   [name] is a 68 y.o. male who presents for Medicare Annual  preventive examination.  The Patient was informed that the wellness visit is to identify future health risk and educate and initiate measures that can reduce risk for increased disease through the lifespan.   Describes health as fair, good or great? Good.   Review of Systems: No ROS.  Medicare Wellness Virtual Visit.  Visual/audio telehealth visit, UTA vital signs.   See social history for additional risk factors. Cardiac Risk Factors include: advanced age (>62men, >65 women);diabetes mellitus;dyslipidemia;hypertension;male gender Sleep patterns: no issues. Pt reports he sleeps with 2L/ O2 via [place]. Home Safety/Smoke Alarms: Feels safe in home. Smoke alarms in place.  Lives with wife in 1 story home.  Male:   CCS- 11/20/16.      PSA-  Lab Results  Component Value Date   PSA 0.26 09/08/2018   PSA 0.29 03/02/2015   PSA 0.24 10/07/2007       Objective:    Vitals: BP 121/79 Comment: pt reports.   Advanced Directives 03/22/2019 03/09/2018 03/04/2018 03/02/2018 01/13/2018 11/20/2016 11/06/2016  Does Patient Have a Medical Advance Directive? Yes No No No No No No  Does patient want to make changes to medical advance directive? No - Patient declined - - - - - -  Would patient like information on creating a medical  advance directive? - No - Patient declined No - Patient declined No - Patient declined Yes (MAU/Ambulatory/Procedural Areas - Information given) - -    Tobacco Social History   Tobacco Use  Smoking Status Former Smoker  . Packs/day: 1.00  . Years: 41.00  . Pack years: 41.00  . Types: Cigarettes  . Quit date: 08/26/2010  . Years since quitting: 8.5  Smokeless Tobacco Never Used  Tobacco Comment   Quit 2012     Counseling given: Not Answered Comment: Quit 2012   Clinical Intake:     Pain : No/denies pain    Past Medical History:  Diagnosis Date  . Abnormal myocardial perfusion study 2006   EF 44% ? inferoseptal ischemia. no cath done  . Breast mass, right 03/2008  . Contact dermatitis 01/31/2013  . COPD (chronic obstructive pulmonary disease) ([place])    "dx'd 12/2017"  . Coronary artery disease   . Heart murmur   . History of colonic polyps   . Hyperlipidemia   . Hypertension   . Low back pain   . Type II diabetes mellitus ([place])    Past Surgical History:  Procedure Laterality Date  . ABDOMINAL AORTOGRAM N/A 01/13/2018   Procedure: ABDOMINAL AORTOGRAM;  Surgeon: Troy Sine, MD;  Location: [facility];  Service: Cardiovascular;  Laterality: N/A;  . AORTIC VALVE REPLACEMENT N/A 03/04/2018   Procedure: AORTIC VALVE REPLACEMENT (AVR) 76mm Edwards Magna Ease Tissue Valve.;  Surgeon: Melrose Nakayama, MD;  Location: [facility];  Service:  Open Heart Surgery;  Laterality: N/A;  . CARDIAC VALVE REPLACEMENT    . COLONOSCOPY W/ POLYPECTOMY    . CORONARY ARTERY BYPASS GRAFT N/A 03/04/2018   Procedure: CORONARY ARTERY BYPASS GRAFTING (CABG) x1:  LIMA to LAD.;  Surgeon: Melrose Nakayama, MD;  Location: [facility];  Service: Open Heart Surgery;  Laterality: N/A;  . LIPOMA EXCISION Left 07/2008    "back of shoulder" Dr Gershon Crane  . RIGHT/LEFT HEART CATH AND CORONARY ANGIOGRAPHY N/A 01/13/2018   Procedure: RIGHT/LEFT HEART CATH AND CORONARY ANGIOGRAPHY;  Surgeon: Troy Sine,  MD;  Location: [facility];  Service: Cardiovascular;  Laterality: N/A;  . TEE WITHOUT CARDIOVERSION N/A 03/04/2018   Procedure: TRANSESOPHAGEAL ECHOCARDIOGRAM (TEE);  Surgeon: Melrose Nakayama, MD;  Location: [facility];  Service: Open Heart Surgery;  Laterality: N/A;  . TONSILLECTOMY    . WISDOM TOOTH EXTRACTION     Family History  Problem Relation Age of Onset  . Melanoma Father 60       deceased secondary to melanoma  . Alzheimer's disease Father   . Diabetes Father   . Hypertension Mother        alive -39  . Colon cancer Neg Hx    Social History   Socioeconomic History  . Marital status: Married    Spouse name: Not on file  . Number of children: Not on file  . Years of education: Not on file  . Highest education level: Not on file  Occupational History  . Not on file  Social Needs  . Financial resource strain: Not on file  . Food insecurity    Worry: Not on file    Inability: Not on file  . Transportation needs    Medical: Not on file    Non-medical: Not on file  Tobacco Use  . Smoking status: Former Smoker    Packs/day: 1.00    Years: 41.00    Pack years: 41.00    Types: Cigarettes    Quit date: 08/26/2010    Years since quitting: 8.5  . Smokeless tobacco: Never Used  . Tobacco comment: Quit 2012  Substance and Sexual Activity  . Alcohol use: Yes    Alcohol/week: 3.0 standard drinks    Types: 3 Glasses of wine per week  . Drug use: Not Currently  . Sexual activity: Not Currently  Lifestyle  . Physical activity    Days per week: Not on file    Minutes per session: Not on file  . Stress: Not on file  Relationships  . Social Herbalist on phone: Not on file    Gets together: Not on file    Attends religious service: Not on file    Active member of club or organization: Not on file    Attends meetings of clubs or organizations: Not on file    Relationship status: Not on file  Other Topics Concern  . 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 aspirin EC 325 MG EC tablet Take 1 tablet (325 mg total) by mouth daily.  . Cyanocobalamin (VITAMIN B-12 PO) Take 1 tablet by mouth daily.  . dapagliflozin propanediol (FARXIGA) 5 MG  TABS tablet Take 5 mg by mouth daily.  . furosemide (LASIX) 20 MG tablet TAKE 1 TABLET BY MOUTH EVERY DAY  . glucose blood (PRODIGY NO CODING BLOOD GLUC) test strip Use as instructed to check blood sugar twice a day.  DX  E11.40  . hydrALAZINE (APRESOLINE) 10 MG tablet Take 1 tablet (10 mg total) by mouth 3 (three) times daily.  . Insulin Pen Needle (B-D ULTRAFINE III SHORT PEN) 31G X 8 MM MISC USE AS DIRECTED  . LEVEMIR FLEXTOUCH 100 UNIT/ML Pen INJECT 60 UNITS INTO THE SKIN DAILY AT 10 PM.  . metFORMIN (GLUCOPHAGE-XR) 500 MG 24 hr tablet Take 2 tablets (1,000 mg total) by mouth 2 (two) times daily.  . metoprolol tartrate (LOPRESSOR) 50 MG tablet Take 1 tablet (50 mg total) by mouth 2 (two) times daily.  Marland Kitchen NOVOLOG FLEXPEN 100 UNIT/ML FlexPen INJECT 11-15 SUBCUTANEOUSLY DAILY BEFORE SUPPER  . rosuvastatin (CRESTOR) 20 MG tablet TAKE 1 TABLET BY MOUTH EVERY DAY  . tadalafil (CIALIS) 5 MG tablet Take 5 mg by mouth daily as needed for erectile dysfunction.   No facility-administered encounter medications on file as of 03/22/2019.     Activities of Daily Living In your present state of health, do you have any difficulty performing the following activities: 03/22/2019  Hearing? N  Vision? N  Difficulty concentrating or making decisions? N  Walking or climbing stairs? N  Dressing or bathing? N  Doing errands, shopping? N  Preparing Food and eating ? N  Using the Toilet? N  In the past six months, have you accidently leaked urine? N  Do you have problems with loss of bowel control? N  Managing your  Medications? N  Managing your Finances? N  Housekeeping or managing your Housekeeping? N  Some recent data might be hidden    Patient Care Team: Debbrah Alar, NP as PCP - General (Internal Medicine) Park Liter, MD as PCP - Cardiology (Cardiology) Philemon Kingdom, MD as Consulting Physician (Internal Medicine)   Assessment:   This is a routine wellness examination for [name]. Physical assessment deferred to PCP.  Exercise Activities and Dietary recommendations Current Exercise Habits: The patient does not participate in regular exercise at present, Exercise limited by: None identified Diet (meal preparation, eat out, water intake, caffeinated beverages, dairy products, fruits and vegetables): well balanced, on average, 2 meals per day   Goals    . Increase physical activity     Do bike 15 min/ 2 days per week.       Fall Risk Fall Risk  03/22/2019 03/12/2018 02/05/2017 02/05/2017  Falls in the past year? 0 No No No    Depression Screen PHQ 2/9 Scores 03/22/2019 08/05/2018 05/06/2018 03/12/2018  PHQ - 2 Score 0 0 0 0  PHQ- 9 Score - - - 1    Cognitive Function Ad8 score reviewed for issues:  Issues making decisions:no  Less interest in hobbies / activities:no  Repeats questions, stories (family complaining):no  Trouble using ordinary gadgets (microwave, computer, phone):no  Forgets the month or year: no  Mismanaging finances: no  Remembering appts:no  Daily problems with thinking and/or memory:no Ad8 score is=0         Immunization History  Administered Date(s) Administered  . Influenza Split 08/04/2012  . Influenza Whole 05/20/2008, 07/12/2009, 05/02/2010  . Influenza, High Dose Seasonal PF 05/20/2017, 06/10/2018  . Influenza,inj,Quad PF,6+ Mos 05/07/2013, 05/09/2014, 06/07/2015, 05/01/2016  . Pneumococcal Conjugate-13 02/05/2017  . Pneumococcal Polysaccharide-23 05/20/2008, 08/25/2018  . Td  08/26/2008   Screening Tests Health  Maintenance  Topic Date Due  . OPHTHALMOLOGY EXAM  08/27/2018  . TETANUS/TDAP  10/10/2018  . HEMOGLOBIN A1C  03/09/2019  . INFLUENZA VACCINE  03/27/2019  . FOOT EXAM  09/09/2019  . COLONOSCOPY  11/21/2019  . Hepatitis C Screening  Completed  . PNA vac Low Risk Adult  Completed     Plan:  See you next year!  Continue to eat heart healthy diet (full of fruits, vegetables, whole grains, lean protein, water--limit salt, fat, and sugar intake) and increase physical activity as tolerated.  Continue doing brain stimulating activities (puzzles, reading, adult coloring books, staying active) to keep memory sharp.     I have personally reviewed and noted the following in the patient's chart:   . Medical and social history . Use of alcohol, tobacco or illicit drugs  . Current medications and supplements . Functional ability and status . Nutritional status . Physical activity . Advanced directives . List of other physicians . Hospitalizations, surgeries, and ER visits in previous 12 months . Vitals . Screenings to include cognitive, depression, and falls . Referrals and appointments  In addition, I have reviewed and discussed with patient certain preventive protocols, quality metrics, and best practice recommendations. A written personalized care plan for preventive services as well as general preventive health recommendations were provided to patient.     Shela Nevin, RN  03/22/2019  Reviewed Ann Held, DO

## 2019-03-22 ENCOUNTER — Encounter: Payer: Self-pay | Admitting: *Deleted

## 2019-03-22 ENCOUNTER — Ambulatory Visit (INDEPENDENT_AMBULATORY_CARE_PROVIDER_SITE_OTHER): Payer: Medicare Other | Admitting: *Deleted

## 2019-03-22 ENCOUNTER — Other Ambulatory Visit: Payer: Self-pay

## 2019-03-22 VITALS — BP 121/79

## 2019-03-22 DIAGNOSIS — Z0000 Encounter for general adult medical examination without abnormal findings: Secondary | ICD-10-CM | POA: Diagnosis not present

## 2019-03-22 NOTE — Patient Instructions (Signed)
See you next year!  Continue to eat heart healthy diet (full of fruits, vegetables, whole grains, lean protein, water--limit salt, fat, and sugar intake) and increase physical activity as tolerated.  Continue doing brain stimulating activities (puzzles, reading, adult coloring books, staying active) to keep memory sharp.    [name] , Thank you for taking time to come for your Medicare Wellness Visit. I appreciate your ongoing commitment to your health goals. Please review the following plan we discussed and let me know if I can assist you in the future.   These are the goals we discussed: Goals    . Increase physical activity     Do bike 15 min/ 2 days per week.       This is a list of the screening recommended for you and due dates:  Health Maintenance  Topic Date Due  . Eye exam for diabetics  08/27/2018  . Tetanus Vaccine  10/10/2018  . Hemoglobin A1C  03/09/2019  . Flu Shot  03/27/2019  . Complete foot exam   09/09/2019  . Colon Cancer Screening  11/21/2019  .  Hepatitis C: One time screening is recommended by Center for Disease Control  (CDC) for  adults born from [birth year] through 1965.   Completed  . Pneumonia vaccines  Completed    Health Maintenance After Age 50 After age 5, you are at a higher risk for certain long-term diseases and infections as well as injuries from falls. Falls are a major cause of broken bones and head injuries in people who are older than age 73. Getting regular preventive care can help to keep you healthy and well. Preventive care includes getting regular testing and making lifestyle changes as recommended by your health care provider. Talk with your health care provider about:  Which screenings and tests you should have. A screening is a test that checks for a disease when you have no symptoms.  A diet and exercise plan that is right for you. What should I know about screenings and tests to prevent falls? Screening and testing are the best ways  to find a health problem early. Early diagnosis and treatment give you the best chance of managing medical conditions that are common after age 8. Certain conditions and lifestyle choices may make you more likely to have a fall. Your health care provider may recommend:  Regular vision checks. Poor vision and conditions such as cataracts can make you more likely to have a fall. If you wear glasses, make sure to get your prescription updated if your vision changes.  Medicine review. Work with your health care provider to regularly review all of the medicines you are taking, including over-the-counter medicines. Ask your health care provider about any side effects that may make you more likely to have a fall. Tell your health care provider if any medicines that you take make you feel dizzy or sleepy.  Osteoporosis screening. Osteoporosis is a condition that causes the bones to get weaker. This can make the bones weak and cause them to break more easily.  Blood pressure screening. Blood pressure changes and medicines to control blood pressure can make you feel dizzy.  Strength and balance checks. Your health care provider may recommend certain tests to check your strength and balance while standing, walking, or changing positions.  Foot health exam. Foot pain and numbness, as well as not wearing proper footwear, can make you more likely to have a fall.  Depression screening. You may be more likely  to have a fall if you have a fear of falling, feel emotionally low, or feel unable to do activities that you used to do.  Alcohol use screening. Using too much alcohol can affect your balance and may make you more likely to have a fall. What actions can I take to lower my risk of falls? General instructions  Talk with your health care provider about your risks for falling. Tell your health care provider if: ? You fall. Be sure to tell your health care provider about all falls, even ones that seem minor.  ? You feel dizzy, sleepy, or off-balance.  Take over-the-counter and prescription medicines only as told by your health care provider. These include any supplements.  Eat a healthy diet and maintain a healthy weight. A healthy diet includes low-fat dairy products, low-fat (lean) meats, and fiber from whole grains, beans, and lots of fruits and vegetables. Home safety  Remove any tripping hazards, such as rugs, cords, and clutter.  Install safety equipment such as grab bars in bathrooms and safety rails on stairs.  Keep rooms and walkways well-lit. Activity   Follow a regular exercise program to stay fit. This will help you maintain your balance. Ask your health care provider what types of exercise are appropriate for you.  If you need a cane or walker, use it as recommended by your health care provider.  Wear supportive shoes that have nonskid soles. Lifestyle  Do not drink alcohol if your health care provider tells you not to drink.  If you drink alcohol, limit how much you have: ? 0-1 drink a day for women. ? 0-2 drinks a day for men.  Be aware of how much alcohol is in your drink. In the [place] one drink equals one typical bottle of beer (12 oz), one-half glass of wine (5 oz), or one shot of hard liquor (1 oz).  Do not use any products that contain nicotine or tobacco, such as cigarettes and e-cigarettes. If you need help quitting, ask your health care provider. Summary  Having a healthy lifestyle and getting preventive care can help to protect your health and wellness after age 22.  Screening and testing are the best way to find a health problem early and help you avoid having a fall. Early diagnosis and treatment give you the best chance for managing medical conditions that are more common for people who are older than age 61.  Falls are a major cause of broken bones and head injuries in people who are older than age 27. Take precautions to prevent a fall at home.  Work  with your health care provider to learn what changes you can make to improve your health and wellness and to prevent falls. This information is not intended to replace advice given to you by your health care provider. Make sure you discuss any questions you have with your health care provider. Document Released: 06/25/2017 Document Revised: 12/03/2018 Document Reviewed: 06/25/2017 Elsevier Patient Education  2020 [employer].

## 2019-03-23 ENCOUNTER — Other Ambulatory Visit: Payer: Self-pay | Admitting: *Deleted

## 2019-03-23 MED ORDER — METFORMIN HCL ER 500 MG PO TB24: 1000.0000 mg | ORAL_TABLET | Freq: Two times a day (BID) | ORAL | 1 refills | Status: DC

## 2019-04-02 ENCOUNTER — Encounter: Payer: Self-pay | Admitting: Pulmonary Disease

## 2019-04-02 ENCOUNTER — Ambulatory Visit (INDEPENDENT_AMBULATORY_CARE_PROVIDER_SITE_OTHER): Payer: Medicare Other | Admitting: Pulmonary Disease

## 2019-04-02 ENCOUNTER — Other Ambulatory Visit: Payer: Self-pay

## 2019-04-02 VITALS — BP 128/74 | HR 68 | Temp 98.0°F | Ht 76.0 in | Wt 179.0 lb

## 2019-04-02 DIAGNOSIS — Z87891 Personal history of nicotine dependence: Secondary | ICD-10-CM | POA: Diagnosis not present

## 2019-04-02 DIAGNOSIS — J449 Chronic obstructive pulmonary disease, unspecified: Secondary | ICD-10-CM | POA: Diagnosis not present

## 2019-04-02 DIAGNOSIS — Z0000 Encounter for general adult medical examination without abnormal findings: Secondary | ICD-10-CM

## 2019-04-02 NOTE — Progress Notes (Signed)
@Patient  ID: [name], male    DOB: 08-22-1951, 68 y.o.   MRN: [id number]  Chief Complaint  Patient presents with   Follow-up    COPD     Referring provider: Debbrah Alar, NP  HPI:  68 year old male former smoker initially referred to our office on 02/02/2018 for dyspnea.  Diagnosed with Gold II COPD, emphysema, Chronic respiratory failure requiring oxygen at night.   PMH: Severe aortic stenosis, status post AVR, CAD, cardiomyopathy, dyslipidemia Smoker/ Smoking History: Former smoker.  41-pack-year smoking history.  Quit 2012. Maintenance:  none, patient has previously failed inhaler therapy and found no benefit, including albuterol rescue inhalers, currently not maintained on any inhalers  Pt of: Dr. Lake Bells  04/02/2019  - Visit   68 year old male former smoker followed in our office for COPD and emphysema.  Patient also with chronic respiratory failure requiring oxygen at night 2 L.  Patient reports today that he has been doing quite well.  He has no issues with his breathing.  He is not on any sort of maintenance inhalers.  He continues to be maintained on 2 L of O2 at night.  He is tolerating this well.  He has no active concerns or issues.   Tests:   04/09/18 >>> ONO - telephone note>>> Patient's ONO on room air showed that he was less than 88% for more than an hour. Patient needs to be on 2 L at HS.  10/06/2018-overnight oximetry-entire duration was 4 hours and 23 minutes, SPO2 less than 88% for 25 minutes and 32 seconds  02/04/2018-high-res CT- no evidence of interstitial lung disease, severe centrilobular emphysema and mild diffuse bronchial wall thickening suggesting COPD, no calcified pleural plaques no pleural effusion, mild smooth bilateral pleural thickening which is nonspecific  05/21/2018-echocardiogram-LV ejection fraction 40 to 78%, grade 1 diastolic dysfunction  2/95/6213-[id number] function test- FVC 3.37 (71% predicted), postbronchodilator ratio  50, postbronchodilator FEV1 54, mid flow reversibility after bronchodilator, DLCO 36 >>>Moderate airflow obstruction, diminished diffusion capacity  02/03/2018-home sleep study- AHI 6 an hour, SaO2 low 85%  Echo: 11/2017 TTE> Critical AS, LVEF 35-40%, mod AR, RVSP 41 mmHg  Heart Catheterization: 12/2017 LHC and RHC showed severe AS, non-obstructive multi-vessel CAD, PA mean 30   FENO:  No results found for: NITRICOXIDE  PFT: PFT Results Latest Ref Rng & Units 02/04/2018  FVC-Pre L 3.37  FVC-Predicted Pre % 71  FVC-Post L 3.88  FVC-Predicted Post % 82  Pre FEV1/FVC % % 53  Post FEV1/FCV % % 50  FEV1-Pre L 1.78  FEV1-Predicted Pre % 49  FEV1-Post L 1.96  DLCO UNC% % 36  DLCO COR %Predicted % 57  TLC L 7.36  TLC % Predicted % 91  RV % Predicted % 125    Imaging: No results found.    Specialty Problems      Pulmonary Problems   Dyspnea on exertion   Centrilobular emphysema (HCC)    02/04/2018-high-res CT- no evidence of interstitial lung disease, severe centrilobular emphysema and mild diffuse bronchial wall thickening suggesting COPD, no calcified pleural plaques no pleural effusion, mild smooth bilateral pleural thickening which is nonspecific  02/04/2018-pulmonary function test- FVC 3.37 (71% predicted), postbronchodilator ratio 50, postbronchodilator FEV1 54, mid flow reversibility after bronchodilator, DLCO 36 >>>Moderate airflow obstruction, diminished diffusion capacity      COPD GOLD II A     02/04/2018-high-res CT- no evidence of interstitial lung disease, severe centrilobular emphysema and mild diffuse bronchial wall thickening suggesting COPD, no calcified pleural plaques  no pleural effusion, mild smooth bilateral pleural thickening which is nonspecific  02/04/2018-pulmonary function test- FVC 3.37 (71% predicted), postbronchodilator ratio 50, postbronchodilator FEV1 54, mid flow reversibility after bronchodilator, DLCO 36 >>>Moderate airflow obstruction, diminished  diffusion capacity      Nocturnal hypoxemia    04/09/18 >>> ONO - telephone note>>> Patient's ONO on room air showed that he was less than 88% for more than an hour. Patient needs to be on 2 L at HS.  10/06/2018-overnight oximetry-entire duration was 4 hours and 23 minutes, SPO2 less than 88% for 25 minutes and 32 seconds           Allergies  Allergen Reactions   Amlodipine Besylate Other (See Comments)    REACTION: ? caused left axillary nodules/chest flutter   Hydrochlorothiazide Hives   Lisinopril     Hyperkalemia     Immunization History  Administered Date(s) Administered   Influenza Split 08/04/2012   Influenza Whole 05/20/2008, 07/12/2009, 05/02/2010   Influenza, High Dose Seasonal PF 05/20/2017, 06/10/2018   Influenza,inj,Quad PF,6+ Mos 05/07/2013, 05/09/2014, 06/07/2015, 05/01/2016   Pneumococcal Conjugate-13 02/05/2017   Pneumococcal Polysaccharide-23 05/20/2008, 08/25/2018   Td 08/26/2008    Past Medical History:  Diagnosis Date   Abnormal myocardial perfusion study 2006   EF 44% ? inferoseptal ischemia. no cath done   Breast mass, right 03/2008   Contact dermatitis 01/31/2013   COPD (chronic obstructive pulmonary disease) ([place])    "dx'd 12/2017"   Coronary artery disease    Heart murmur    History of colonic polyps    Hyperlipidemia    Hypertension    Low back pain    Type II diabetes mellitus ([place])     Tobacco History: Social History   Tobacco Use  Smoking Status Former Smoker   Packs/day: 1.00   Years: 41.00   Pack years: 41.00   Types: Cigarettes   Quit date: 08/26/2010   Years since quitting: 8.6  Smokeless Tobacco Never Used  Tobacco Comment   Quit 2012   Counseling given: Not Answered Comment: Quit 2012   Continue to not smoke  Outpatient Encounter Medications as of 04/02/2019  Medication Sig   aspirin EC 325 MG EC tablet Take 1 tablet (325 mg total) by mouth daily.   Cyanocobalamin (VITAMIN B-12 PO) Take  1 tablet by mouth daily.   dapagliflozin propanediol (FARXIGA) 5 MG TABS tablet Take 5 mg by mouth daily.   furosemide (LASIX) 20 MG tablet TAKE 1 TABLET BY MOUTH EVERY DAY   glucose blood (PRODIGY NO CODING BLOOD GLUC) test strip Use as instructed to check blood sugar twice a day.  DX  E11.40   hydrALAZINE (APRESOLINE) 10 MG tablet Take 1 tablet (10 mg total) by mouth 3 (three) times daily.   Insulin Pen Needle (B-D ULTRAFINE III SHORT PEN) 31G X 8 MM MISC USE AS DIRECTED   LEVEMIR FLEXTOUCH 100 UNIT/ML Pen INJECT 60 UNITS INTO THE SKIN DAILY AT 10 PM.   metFORMIN (GLUCOPHAGE-XR) 500 MG 24 hr tablet Take 2 tablets (1,000 mg total) by mouth 2 (two) times daily.   metoprolol tartrate (LOPRESSOR) 50 MG tablet Take 1 tablet (50 mg total) by mouth 2 (two) times daily.   NOVOLOG FLEXPEN 100 UNIT/ML FlexPen INJECT 11-15 SUBCUTANEOUSLY DAILY BEFORE SUPPER   rosuvastatin (CRESTOR) 20 MG tablet TAKE 1 TABLET BY MOUTH EVERY DAY   tadalafil (CIALIS) 5 MG tablet Take 5 mg by mouth daily as needed for erectile dysfunction.   No facility-administered encounter medications  on file as of 04/02/2019.      Review of Systems  Review of Systems  Constitutional: Negative for activity change, chills, fatigue, fever and unexpected weight change.  HENT: Negative for postnasal drip, rhinorrhea, sinus pressure, sinus pain and sore throat.   Eyes: Negative.   Respiratory: Negative for cough, shortness of breath and wheezing.   Cardiovascular: Negative for chest pain and palpitations.  Gastrointestinal: Negative for constipation, diarrhea, nausea and vomiting.  Endocrine: Negative.   Genitourinary: Negative.   Musculoskeletal: Negative.   Skin: Negative.   Neurological: Negative for dizziness and headaches.  Psychiatric/Behavioral: Negative.  Negative for dysphoric mood. The patient is not nervous/anxious.   All other systems reviewed and are negative.    Physical Exam  BP 128/74 (BP Location: Left  Arm, Patient Position: Sitting, Cuff Size: Normal)    Pulse 68    Temp 98 F (36.7 C)    Ht 6\' 4"  (1.93 m)    Wt 179 lb (81.2 kg)    SpO2 98%    BMI 21.79 kg/m   Wt Readings from Last 5 Encounters:  04/02/19 179 lb (81.2 kg)  10/12/18 181 lb (82.1 kg)  09/08/18 181 lb (82.1 kg)  09/01/18 178 lb (80.7 kg)  08/25/18 179 lb 9.6 oz (81.5 kg)    Physical Exam Vitals signs and nursing note reviewed.  Constitutional:      General: He is not in acute distress.    Appearance: Normal appearance. He is obese.  HENT:     Head: Normocephalic and atraumatic.     Right Ear: Hearing, tympanic membrane, ear canal and external ear normal.     Left Ear: Hearing, tympanic membrane, ear canal and external ear normal.     Nose: Rhinorrhea present. No mucosal edema or congestion.     Right Turbinates: Not enlarged.     Left Turbinates: Not enlarged.     Mouth/Throat:     Mouth: Mucous membranes are dry.     Pharynx: Oropharynx is clear. No oropharyngeal exudate.  Eyes:     Pupils: Pupils are equal, round, and reactive to light.  Neck:     Musculoskeletal: Normal range of motion.  Cardiovascular:     Rate and Rhythm: Normal rate and regular rhythm.     Pulses: Normal pulses.     Heart sounds: Normal heart sounds. No murmur.  Pulmonary:     Effort: Pulmonary effort is normal. No respiratory distress.     Breath sounds: Normal breath sounds. No decreased breath sounds, wheezing or rales.  Musculoskeletal:     Right lower leg: No edema.     Left lower leg: No edema.  Lymphadenopathy:     Cervical: No cervical adenopathy.  Skin:    General: Skin is warm and dry.     Capillary Refill: Capillary refill takes less than 2 seconds.     Findings: No erythema or rash.  Neurological:     General: No focal deficit present.     Mental Status: He is alert and oriented to person, place, and time.     Motor: No weakness.     Coordination: Coordination normal.     Gait: Gait is intact. Gait normal.    Psychiatric:        Mood and Affect: Mood normal.        Behavior: Behavior normal. Behavior is cooperative.        Thought Content: Thought content normal.        Judgment: Judgment  normal.      Lab Results:  CBC    Component Value Date/Time   WBC 11.8 (H) 03/08/2018 0538   RBC 4.84 03/08/2018 0538   HGB 11.0 (L) 03/08/2018 0538   HCT 36.4 (L) 03/08/2018 0538   PLT 361 03/08/2018 0538   MCV 75.2 (L) 03/08/2018 0538   MCH 22.7 (L) 03/08/2018 0538   MCHC 30.2 03/08/2018 0538   RDW 15.1 03/08/2018 0538   LYMPHSABS 1.9 12/30/2017 0858   MONOABS 0.7 12/30/2017 0858   EOSABS 0.1 12/30/2017 0858   BASOSABS 0.0 12/30/2017 0858    BMET    Component Value Date/Time   NA 141 09/17/2018 0841   NA 145 (H) 07/30/2018 1632   K 5.0 09/17/2018 0841   CL 102 09/17/2018 0841   CO2 30 09/17/2018 0841   GLUCOSE 145 (H) 09/17/2018 0841   BUN 14 09/17/2018 0841   BUN 15 07/30/2018 1632   CREATININE 0.91 09/17/2018 0841   CREATININE 0.84 11/30/2013 0856   CALCIUM 9.8 09/17/2018 0841   GFRNONAA 81 07/30/2018 1632   GFRNONAA >89 10/26/2013 0906   GFRAA 94 07/30/2018 1632   GFRAA >89 10/26/2013 0906    BNP No results found for: BNP  ProBNP No results found for: PROBNP    Assessment & Plan:   COPD GOLD II A  Plan: Continue to remain physically active Continue oxygen therapy 2 L at night Can continue rescue inhaler as needed Follow-up in 6 months   Former cigarette smoker Quit in 2012 41-pack-year smoking history  Plan: Continue to not smoke Referral to lung cancer screening program  Preventative health care Plan: Patient needs flu vaccine in fall/2020    Return in about 6 months (around 10/03/2019), or if symptoms worsen or fail to improve, for Follow up with [name] FNP-C.   Lauraine Rinne, NP 04/02/2019   This appointment was 26 minutes long with over 50% of the time in direct face-to-face patient care, assessment, plan of care, and follow-up.

## 2019-04-02 NOTE — Assessment & Plan Note (Signed)
Plan: Continue to remain physically active Continue oxygen therapy 2 L at night Can continue rescue inhaler as needed Follow-up in 6 months

## 2019-04-02 NOTE — Assessment & Plan Note (Signed)
Plan: Patient needs flu vaccine in fall/2020

## 2019-04-02 NOTE — Patient Instructions (Addendum)
Continue 2 L of O2 at night  We will refer you today to our lung cancer screening program >>>This is based off of your 41 pack-year smoking history >>> This is a recommendation from the [place] preventative services task force (USPSTF) >>>The USPSTF recommends annual screening for lung cancer with low-dose computed tomography (LDCT) in adults aged 68 to 80 years who have a 30 pack-year smoking history and currently smoke or have quit within the past 15 years. Screening should be discontinued once a person has not smoked for 15 years or develops a health problem that substantially limits life expectancy or the ability or willingness to have curative lung surgery.   Our office will call you and set up an appointment with Eric Form (Nurse Practitioner) who leads this program.  This appointment takes place in our office.  After completing this meeting with Eric Form NP you will get a low-dose CT as the screening >>>We will call you with those results    Note your daily symptoms > remember "red flags" for COPD:   >>>Increase in cough >>>increase in sputum production >>>increase in shortness of breath or activity  intolerance.   If you notice these symptoms, please call the office to be seen.     Return in about 6 months (around 10/03/2019), or if symptoms worsen or fail to improve, for Follow up with [name] Quaker FNP-C.    Coronavirus (COVID-19) Are you at risk?  Are you at risk for the Coronavirus (COVID-19)?  To be considered HIGH RISK for Coronavirus (COVID-19), you have to meet the following criteria:  . Traveled to [place], [place], [place], [place] or [place]; or in the [place] to [place], [place], [place], or [place]; and have fever, cough, and shortness of breath within the last 2 weeks of travel OR . Been in close contact with a person diagnosed with COVID-19 within the last 2 weeks and have fever, cough, and shortness of breath . IF YOU DO NOT MEET THESE CRITERIA,  YOU ARE CONSIDERED LOW RISK FOR COVID-19.  What to do if you are HIGH RISK for COVID-19?  Marland Kitchen If you are having a medical emergency, call 911. . Seek medical care right away. Before you go to a doctor's office, urgent care or emergency department, call ahead and tell them about your recent travel, contact with someone diagnosed with COVID-19, and your symptoms. You should receive instructions from your physician's office regarding next steps of care.  . When you arrive at healthcare provider, tell the healthcare staff immediately you have returned from visiting [place], [place], [place], [place] or [place]; or traveled in the [place] to [place], [place], [place], or [place]; in the last two weeks or you have been in close contact with a person diagnosed with COVID-19 in the last 2 weeks.   . Tell the health care staff about your symptoms: fever, cough and shortness of breath. . After you have been seen by a medical provider, you will be either: o Tested for (COVID-19) and discharged home on quarantine except to seek medical care if symptoms worsen, and asked to  - Stay home and avoid contact with others until you get your results (4-5 days)  - Avoid travel on public transportation if possible (such as bus, train, or airplane) or o Sent to the Emergency Department by EMS for evaluation, COVID-19 testing, and possible admission depending on your condition and test results.  What to do if you are LOW  RISK for COVID-19?  Reduce your risk of any infection by using the same precautions used for avoiding the common cold or flu:  Marland Kitchen Wash your hands often with soap and warm water for at least 20 seconds.  If soap and water are not readily available, use an alcohol-based hand sanitizer with at least 60% alcohol.  . If coughing or sneezing, cover your mouth and nose by coughing or sneezing into the elbow areas of your shirt or coat, into a tissue or into your sleeve (not your hands). . Avoid shaking  hands with others and consider head nods or verbal greetings only. . Avoid touching your eyes, nose, or mouth with unwashed hands.  . Avoid close contact with people who are sick. . Avoid places or events with large numbers of people in one location, like concerts or sporting events. . Carefully consider travel plans you have or are making. . If you are planning any travel outside or inside the [place], visit the CDC's Travelers' Health webpage for the latest health notices. . If you have some symptoms but not all symptoms, continue to monitor at home and seek medical attention if your symptoms worsen. . If you are having a medical emergency, call 911.   [place] / e-Visit: [email or web address]         [facility]: [facility]: [phone]                   [facility]: [phone]           It is flu season:   >>> Best ways to protect herself from the flu: Receive the yearly flu vaccine, practice good hand hygiene washing with soap and also using hand sanitizer when available, eat a nutritious meals, get adequate rest, hydrate appropriately   Please contact the office if your symptoms worsen or you have concerns that you are not improving.   Thank you for choosing [facility] for your healthcare, and for allowing [place] to partner with you on your healthcare journey. I am thankful to be able to provide care to you today.   [name] Quaker FNP-C

## 2019-04-02 NOTE — Assessment & Plan Note (Signed)
Quit in 2012 41-pack-year smoking history  Plan: Continue to not smoke Referral to lung cancer screening program

## 2019-04-07 ENCOUNTER — Encounter: Payer: Self-pay | Admitting: *Deleted

## 2019-04-07 ENCOUNTER — Other Ambulatory Visit: Payer: Self-pay | Admitting: *Deleted

## 2019-04-07 DIAGNOSIS — Z87891 Personal history of nicotine dependence: Secondary | ICD-10-CM

## 2019-04-07 DIAGNOSIS — Z122 Encounter for screening for malignant neoplasm of respiratory organs: Secondary | ICD-10-CM

## 2019-04-10 NOTE — Progress Notes (Signed)
Reviewed, agree 

## 2019-04-12 ENCOUNTER — Telehealth: Payer: Self-pay | Admitting: Pulmonary Disease

## 2019-04-12 DIAGNOSIS — G4734 Idiopathic sleep related nonobstructive alveolar hypoventilation: Secondary | ICD-10-CM

## 2019-04-12 NOTE — Telephone Encounter (Signed)
PCCs, can you help with this? Thank you.  

## 2019-04-13 NOTE — Telephone Encounter (Signed)
Saunders Glance are correct [place]. Placed order for oxygen, please route back to triage after the order has been sent to [facility].

## 2019-04-13 NOTE — Telephone Encounter (Signed)
Spoke with pt, advised him that [facility] received the order and he should be hearing from them soon. Pt understood and nothing further is needed.

## 2019-04-13 NOTE — Telephone Encounter (Signed)
Order was sent to [facility].  Waiting on confirmation of receipt.

## 2019-04-13 NOTE — Telephone Encounter (Signed)
I do not see a new O2 order for this patient & from [place] looks like pt was to continue O2 therapry.Marland Kitchen

## 2019-04-13 NOTE — Telephone Encounter (Signed)
This was rec'd per Levada Dy w/ [employer].

## 2019-04-26 ENCOUNTER — Ambulatory Visit (INDEPENDENT_AMBULATORY_CARE_PROVIDER_SITE_OTHER): Payer: Medicare Other | Admitting: Acute Care

## 2019-04-26 ENCOUNTER — Encounter: Payer: Self-pay | Admitting: Acute Care

## 2019-04-26 ENCOUNTER — Other Ambulatory Visit: Payer: Self-pay

## 2019-04-26 ENCOUNTER — Ambulatory Visit
Admission: RE | Admit: 2019-04-26 | Discharge: 2019-04-26 | Disposition: A | Discharge status: Home / Self Care | Payer: Medicare Other | Source: Ambulatory Visit | Attending: Acute Care | Admitting: Acute Care

## 2019-04-26 ENCOUNTER — Ambulatory Visit: Payer: Medicare Other

## 2019-04-26 VITALS — BP 154/94 | HR 78 | Temp 97.7°F | Ht 76.0 in | Wt 179.6 lb

## 2019-04-26 DIAGNOSIS — Z87891 Personal history of nicotine dependence: Secondary | ICD-10-CM

## 2019-04-26 DIAGNOSIS — Z122 Encounter for screening for malignant neoplasm of respiratory organs: Secondary | ICD-10-CM

## 2019-04-26 NOTE — Patient Instructions (Signed)
Thank you for participating in the [facility]. It was our pleasure to meet you today. We will call you with the results of your scan within the next few days. Your scan will be assigned a Lung RADS category score by the physicians reading the scans.  This Lung RADS score determines follow up scanning.  See below for description of categories, and follow up screening recommendations. We will be in touch to schedule your follow up screening annually or based on recommendations of our providers. We will fax a copy of your scan results to your Primary Care Physician, or the physician who referred you to the program, to ensure they have the results. Please call the office if you have any questions or concerns regarding your scanning experience or results.  Our office number is [phone]. Please speak with Denise Phelps, RN. She is our Lung Cancer Screening RN. If she is unavailable when you call, please have the office staff send her a message. She will return your call at her earliest convenience. Remember, if your scan is normal, we will scan you annually as long as you continue to meet the criteria for the program. (Age 55-77, Current smoker or smoker who has quit within the last 15 years). If you are a smoker, remember, quitting is the single most powerful action that you can take to decrease your risk of lung cancer and other pulmonary, breathing related problems. We know quitting is hard, and we are here to help.  Please let us know if there is anything we can do to help you meet your goal of quitting. If you are a former smoker, congratulations. We are proud of you! Remain smoke free! Remember you can refer friends or family members through the number above.  We will screen them to make sure they meet criteria for the program. Thank you for helping us take better care of you by participating in Lung Screening.  Lung RADS Categories:  Lung RADS 1: no nodules  or definitely non-concerning nodules.  Recommendation is for a repeat annual scan in 12 months.  Lung RADS 2:  nodules that are non-concerning in appearance and behavior with a very low likelihood of becoming an active cancer. Recommendation is for a repeat annual scan in 12 months.  Lung RADS 3: nodules that are probably non-concerning , includes nodules with a low likelihood of becoming an active cancer.  Recommendation is for a 6-month repeat screening scan. Often noted after an upper respiratory illness. We will be in touch to make sure you have no questions, and to schedule your 6-month scan.  Lung RADS 4 A: nodules with concerning findings, recommendation is most often for a follow up scan in 3 months or additional testing based on our provider's assessment of the scan. We will be in touch to make sure you have no questions and to schedule the recommended 3 month follow up scan.  Lung RADS 4 B:  indicates findings that are concerning. We will be in touch with you to schedule additional diagnostic testing based on our provider's  assessment of the scan.   

## 2019-04-26 NOTE — Progress Notes (Signed)
Shared Decision Making Visit Lung Cancer Screening Program [phone])   Eligibility:  Age 68 y.o.  Pack Years Smoking History Calculation 61 pack year smoking history (# packs/per year x # years smoked)  Recent History of coughing up blood  no  Unexplained weight loss? no ( >Than 15 pounds within the last 6 months )  Prior History Lung / other cancer no (Diagnosis within the last 5 years already requiring surveillance chest CT Scans).  Smoking Status Current Smoker  Former Smokers: Years since quit: NA  Quit Date: 2012  Visit Components:  Discussion included one or more decision making aids. yes  Discussion included risk/benefits of screening. yes  Discussion included potential follow up diagnostic testing for abnormal scans. yes  Discussion included meaning and risk of over diagnosis. yes  Discussion included meaning and risk of False Positives. yes  Discussion included meaning of total radiation exposure. yes  Counseling Included:  Importance of adherence to annual lung cancer LDCT screening. yes  Impact of comorbidities on ability to participate in the program. yes  Ability and willingness to under diagnostic treatment. yes  Smoking Cessation Counseling:  Current Smokers:   Discussed importance of smoking cessation. yes  Information about tobacco cessation classes and interventions provided to patient. yes  Patient provided with "ticket" for LDCT Scan. yes  Symptomatic Patient. no  Counseling: NA  Diagnosis Code: Tobacco Use Z72.0  Asymptomatic Patient yes  Counseling (Intermediate counseling: > three minutes counseling) [id number]  Former Smokers:   Discussed the importance of maintaining cigarette abstinence. yes  Diagnosis Code: Personal History of Nicotine Dependence. [id number]  Information about tobacco cessation classes and interventions provided to patient. Yes  Patient provided with "ticket" for LDCT Scan. yes  Written Order for Lung Cancer  Screening with LDCT placed in Epic. Yes (CT Chest Lung Cancer Screening Low Dose W/O CM) [id number] Z12.2-Screening of respiratory organs Z87.891-Personal history of nicotine dependence  BP (!) 154/94 (BP Location: Left Arm, Cuff Size: Normal)   Pulse 78   Temp 97.7 F (36.5 C) (Oral)   Ht 6\' 4"  (1.93 m)   Wt 179 lb 9.6 oz (81.5 kg)   SpO2 94%   BMI 21.86 kg/m    I have spent 25 minutes of face to face time with [name] discussing the risks and benefits of lung cancer screening. We viewed a power point together that explained in detail the above noted topics. We paused at intervals to allow for questions to be asked and answered to ensure understanding.We discussed that the single most powerful action that he can take to decrease his risk of developing lung cancer is to quit smoking. We discussed whether or not he is ready to commit to setting a quit date. We discussed options for tools to aid in quitting smoking including nicotine replacement therapy, non-nicotine medications, support groups, Quit Smart classes, and behavior modification. We discussed that often times setting smaller, more achievable goals, such as eliminating 1 cigarette a day for a week and then 2 cigarettes a day for a week can be helpful in slowly decreasing the number of cigarettes smoked. This allows for a sense of accomplishment as well as providing a clinical benefit. I gave him the " Be Stronger Than Your Excuses" card with contact information for community resources, classes, free nicotine replacement therapy, and access to mobile apps, text messaging, and on-line smoking cessation help. I have also given him my card and contact information in the event he needs to contact me. We  discussed the time and location of the scan, and that either Doroteo Glassman RN or I will call with the results within 24-48 hours of receiving them. I have offered him  a copy of the power point we viewed  as a resource in the event they need  reinforcement of the concepts we discussed today in the office. The patient verbalized understanding of all of  the above and had no further questions upon leaving the office. They have my contact information in the event they have any further questions.  I spent 5 minutes counseling on smoking cessation and the health risks of continued tobacco abuse.  I explained to the patient that there has been a high incidence of coronary artery disease noted on these exams. I explained that this is a non-gated exam therefore degree or severity cannot be determined. This patient is on statin therapy. I have asked the patient to follow-up with their PCP regarding any incidental finding of coronary artery disease and management with diet or medication as their PCP  feels is clinically indicated. The patient verbalized understanding of the above and had no further questions upon completion of the visit.      Magdalen Spatz, NP 04/26/2019 2:38 PM

## 2019-04-28 ENCOUNTER — Ambulatory Visit (HOSPITAL_BASED_OUTPATIENT_CLINIC_OR_DEPARTMENT_OTHER)
Admission: RE | Admit: 2019-04-28 | Discharge: 2019-04-28 | Disposition: A | Discharge status: Home / Self Care | Payer: Medicare Other | Source: Ambulatory Visit | Attending: Cardiology | Admitting: Cardiology

## 2019-04-28 ENCOUNTER — Other Ambulatory Visit: Payer: Self-pay

## 2019-04-28 DIAGNOSIS — I10 Essential (primary) hypertension: Secondary | ICD-10-CM | POA: Diagnosis not present

## 2019-04-28 DIAGNOSIS — Z952 Presence of prosthetic heart valve: Secondary | ICD-10-CM | POA: Diagnosis not present

## 2019-04-28 DIAGNOSIS — Z951 Presence of aortocoronary bypass graft: Secondary | ICD-10-CM | POA: Insufficient documentation

## 2019-04-28 NOTE — Progress Notes (Signed)
  Echocardiogram 2D Echocardiogram has been performed.  Cardell Peach 04/28/2019, 11:07 AM

## 2019-04-29 ENCOUNTER — Telehealth: Payer: Self-pay

## 2019-04-29 NOTE — Telephone Encounter (Signed)
Copied from [place] [id number]. Topic: General - Other >> Apr 29, 2019 11:26 AM Yvette Rack wrote: Reason for CRM: Pt stated he received a letter informing him that there is a recall on metFORMIN (GLUCOPHAGE-XR) 500 MG 24 hr tablet. Pt requests call back.

## 2019-04-30 ENCOUNTER — Other Ambulatory Visit: Payer: Self-pay | Admitting: *Deleted

## 2019-04-30 DIAGNOSIS — Z122 Encounter for screening for malignant neoplasm of respiratory organs: Secondary | ICD-10-CM

## 2019-04-30 DIAGNOSIS — Z87891 Personal history of nicotine dependence: Secondary | ICD-10-CM

## 2019-04-30 MED ORDER — METFORMIN HCL 1000 MG PO TABS: 1000.0000 mg | ORAL_TABLET | Freq: Two times a day (BID) | ORAL | 5 refills | Status: DC

## 2019-04-30 NOTE — Telephone Encounter (Signed)
Stop glucophage xr and start regular release glucophage 1000mg  twice daily.  rx has been sent.

## 2019-04-30 NOTE — Telephone Encounter (Signed)
Patient advised of medication change.

## 2019-05-09 ENCOUNTER — Other Ambulatory Visit: Payer: Self-pay | Admitting: Family

## 2019-05-12 IMAGING — DX DG CHEST 1V PORT
1 series · 1 of 1 positions shown · non-contrast
Comparison: 03/02/2018 chest radiograph.

CLINICAL DATA: 66 y/o M; status post CABG and aortic valve
replacement.

EXAM:
PORTABLE CHEST 1 VIEW

[chest ap]
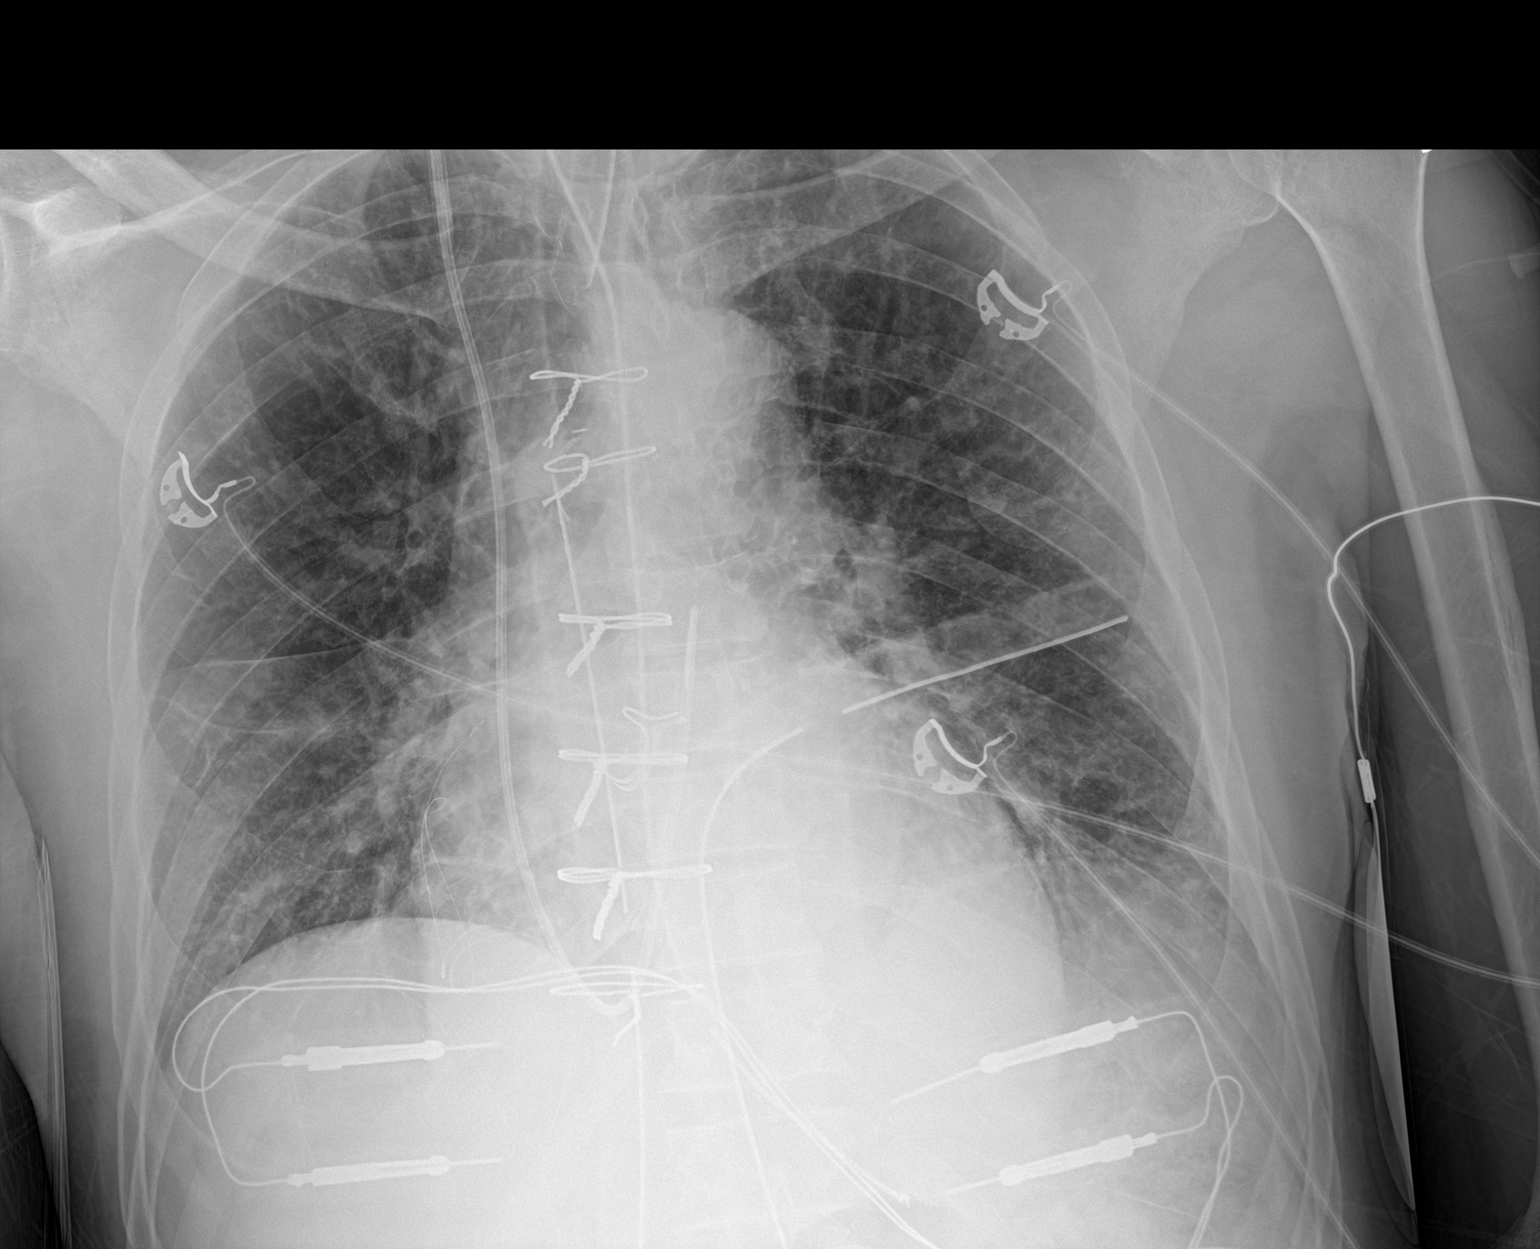

[1 of 1 positions shown; findings below may reference images not displayed]

FINDINGS: Status post CABG with sternotomy wires and mediastinal drains.
Endotracheal tube tip projects 5.8 cm above the carina. Jim
catheter tip projects over mid mediastinum. Enteric tube tip
projects over proximal stomach. Emphysema. Mild pulmonary edema.
Small left effusions. Stable cardiac silhouette given projection and
technique.
IMPRESSION: 1. Endotracheal tube tip projects 5.8 cm above the carina.
2. Jim catheter tip projects over mid mediastinum.
3. Enteric tube tip projects over proximal stomach.
4. Emphysema. Mild pulmonary edema. Small left effusions.

By: Giorgi Jumper M.D.

## 2019-05-13 IMAGING — DX DG CHEST 1V PORT
1 series · 1 of 1 positions shown · non-contrast
Comparison: Chest radiograph from one day prior.

CLINICAL DATA: Status post CABG

EXAM:
PORTABLE CHEST 1 VIEW

[chest ap]
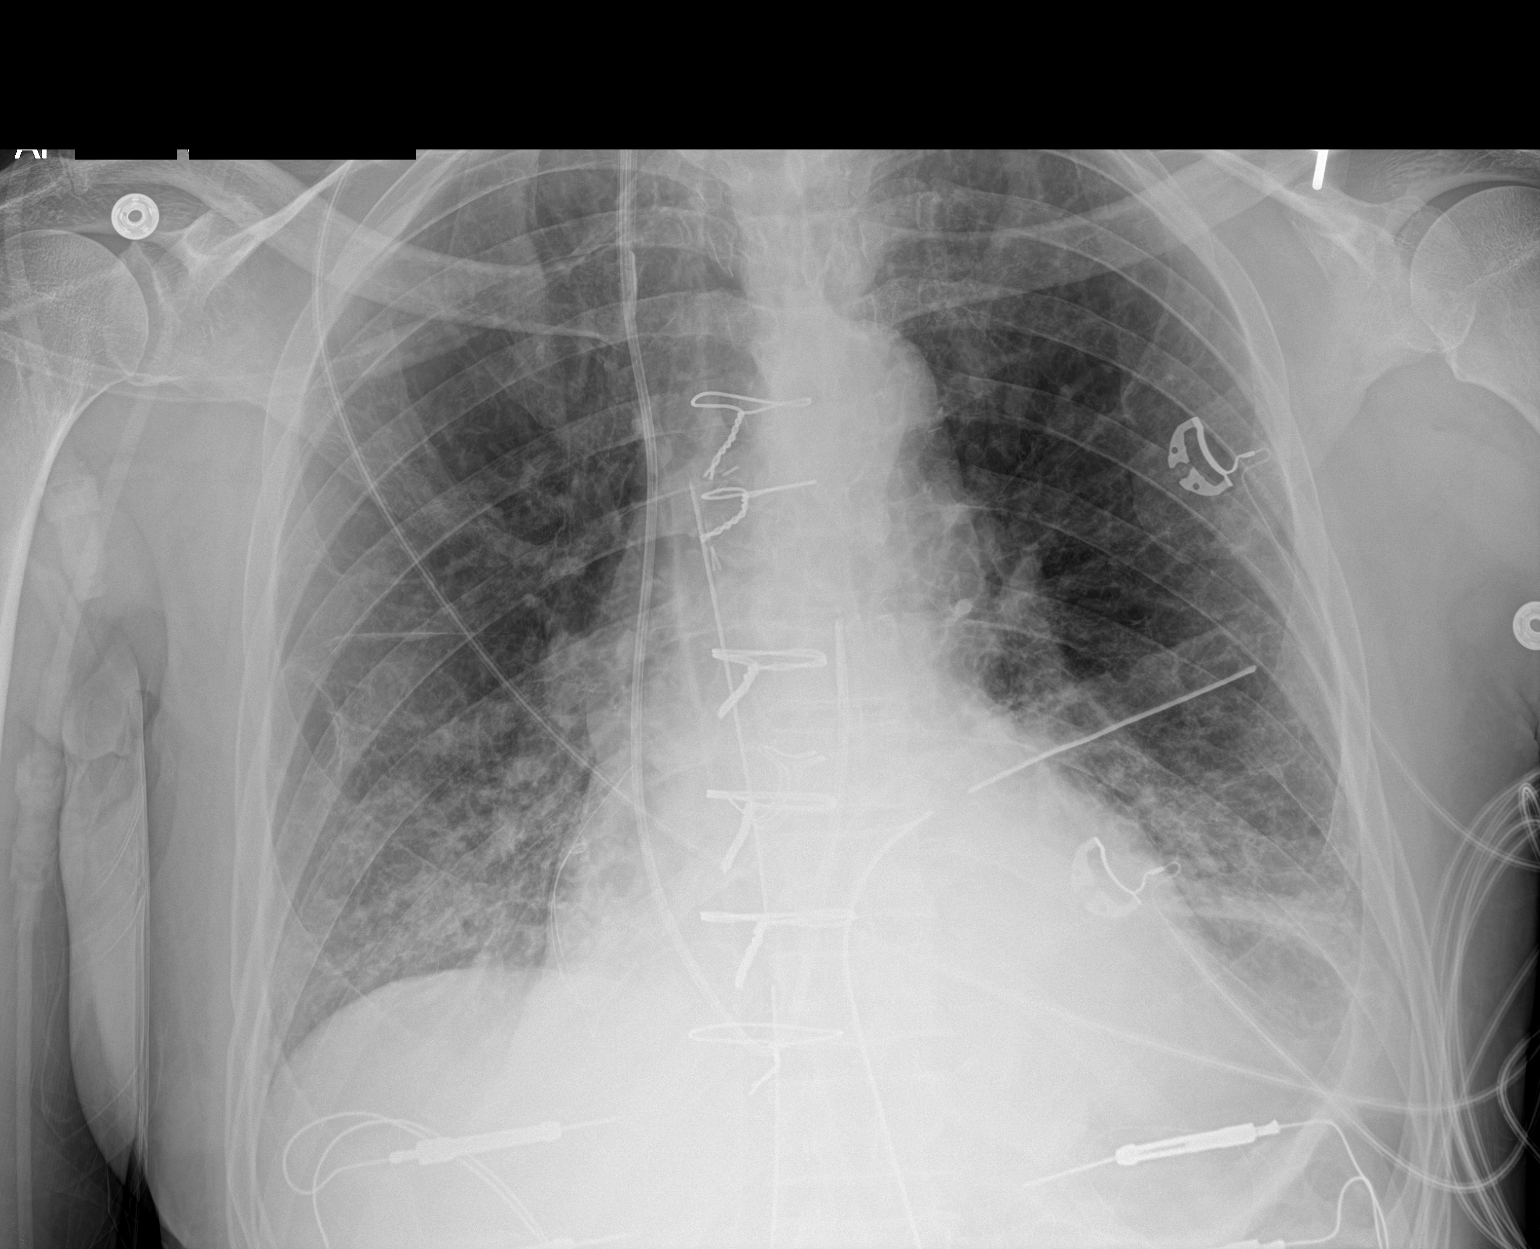

[1 of 1 positions shown; findings below may reference images not displayed]

FINDINGS: Interval extubation. Removal of enteric tube. Right internal jugular
Swan-Ganz catheter terminates over right ventricular outflow
tract/proximal main pulmonary artery. Mediastinal drain and left
chest tube are in place. Cardiac valvular prosthesis is in place.
Intact sternotomy wires. Stable cardiomediastinal silhouette with
top-normal heart size. No pneumothorax. No pleural effusion. No
overt pulmonary edema. Mild bibasilar atelectasis, unchanged.
IMPRESSION: 1. No pneumothorax.
2. No overt pulmonary edema.
3. Mild bibasilar atelectasis, unchanged.

## 2019-05-16 ENCOUNTER — Other Ambulatory Visit: Payer: Self-pay | Admitting: Family

## 2019-05-26 ENCOUNTER — Other Ambulatory Visit: Payer: Self-pay

## 2019-05-26 ENCOUNTER — Ambulatory Visit (INDEPENDENT_AMBULATORY_CARE_PROVIDER_SITE_OTHER): Payer: Medicare Other

## 2019-05-26 DIAGNOSIS — Z23 Encounter for immunization: Secondary | ICD-10-CM | POA: Diagnosis not present

## 2019-06-08 IMAGING — CR DG CHEST 2V
2 series · 2 of 2 positions shown · non-contrast
Comparison: Portable chest x-ray March 06, 2018

CLINICAL DATA: Status post aortic valve replacement on March 04, 2018. No current complaints.

EXAM:
CHEST - 2 VIEW

[w chest pa]
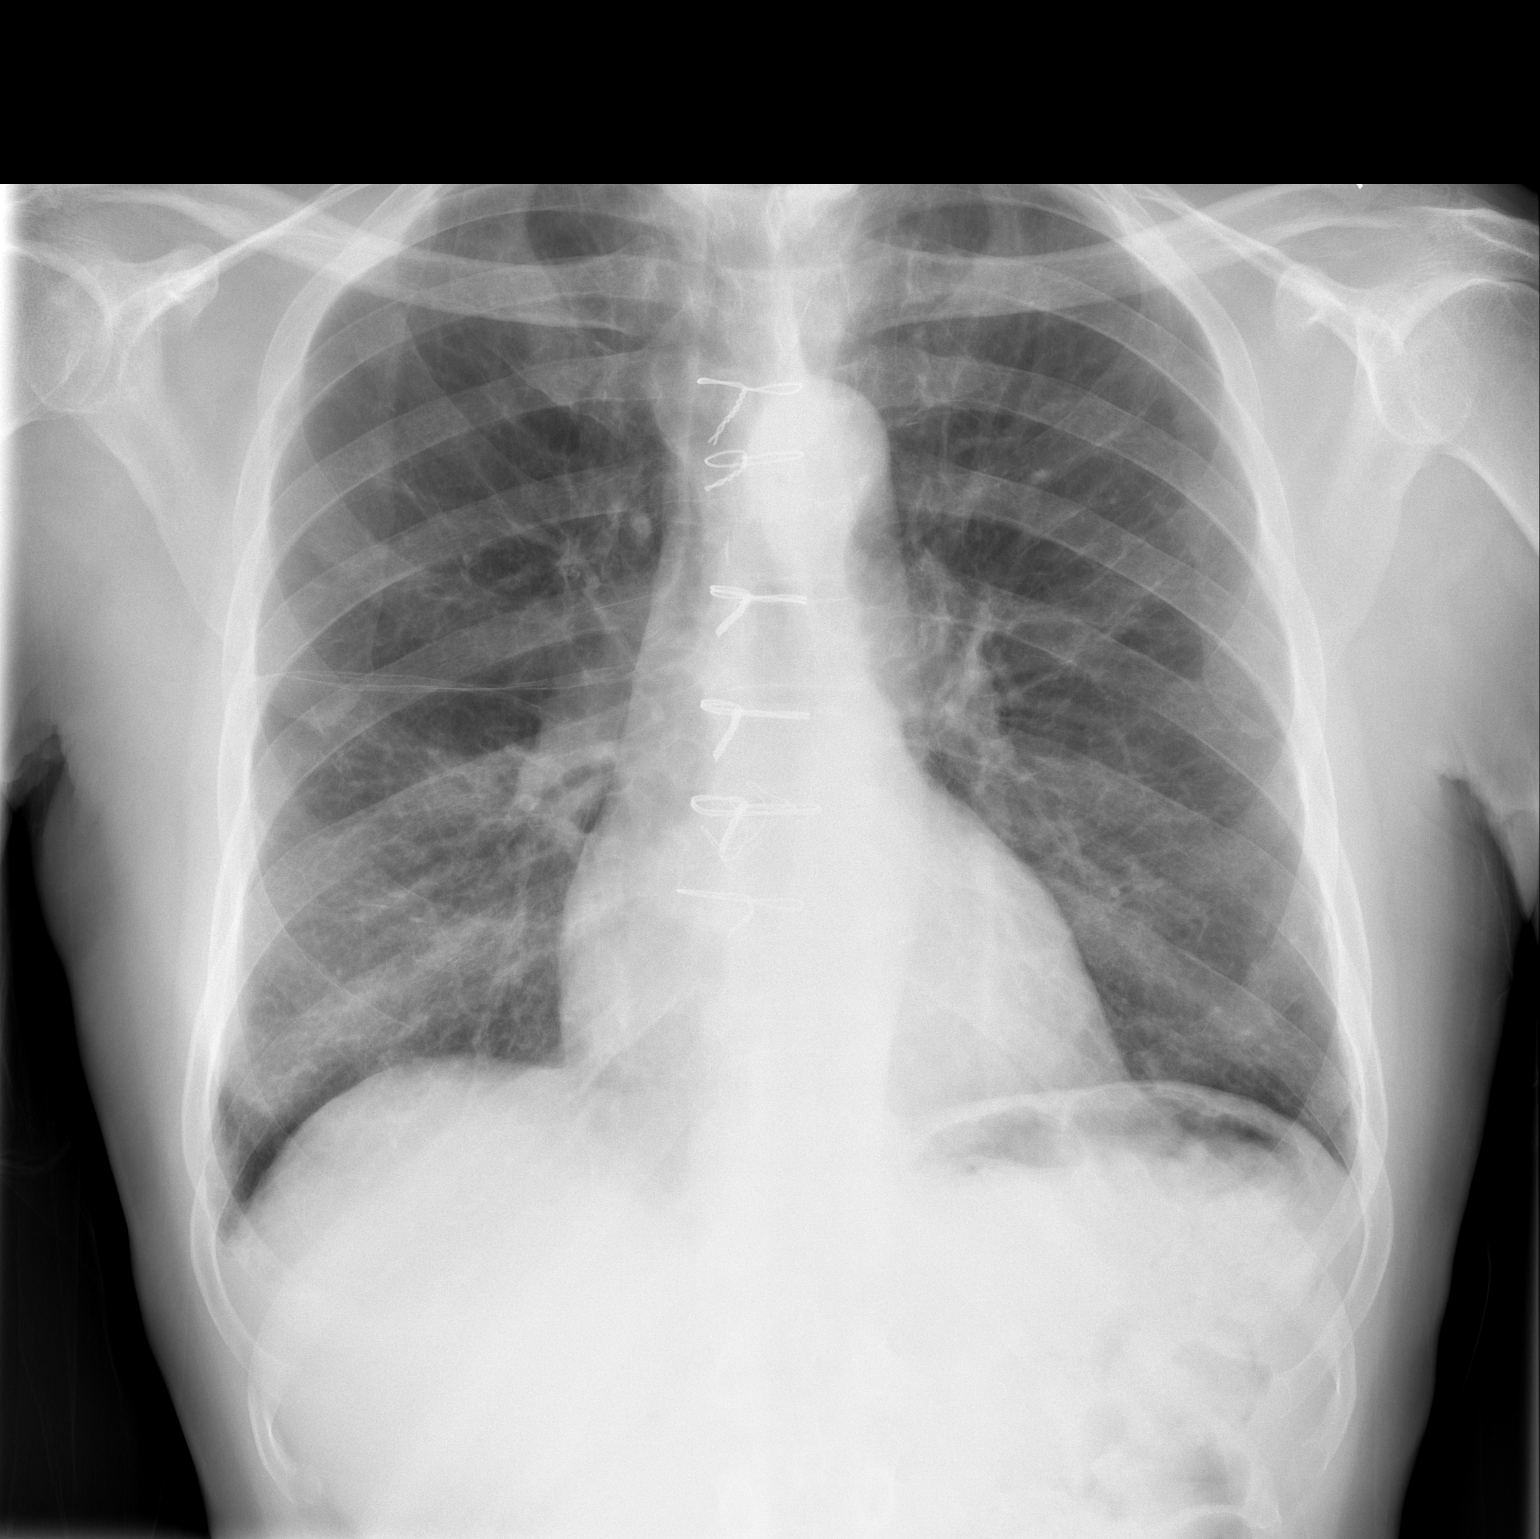

[w chest lat]
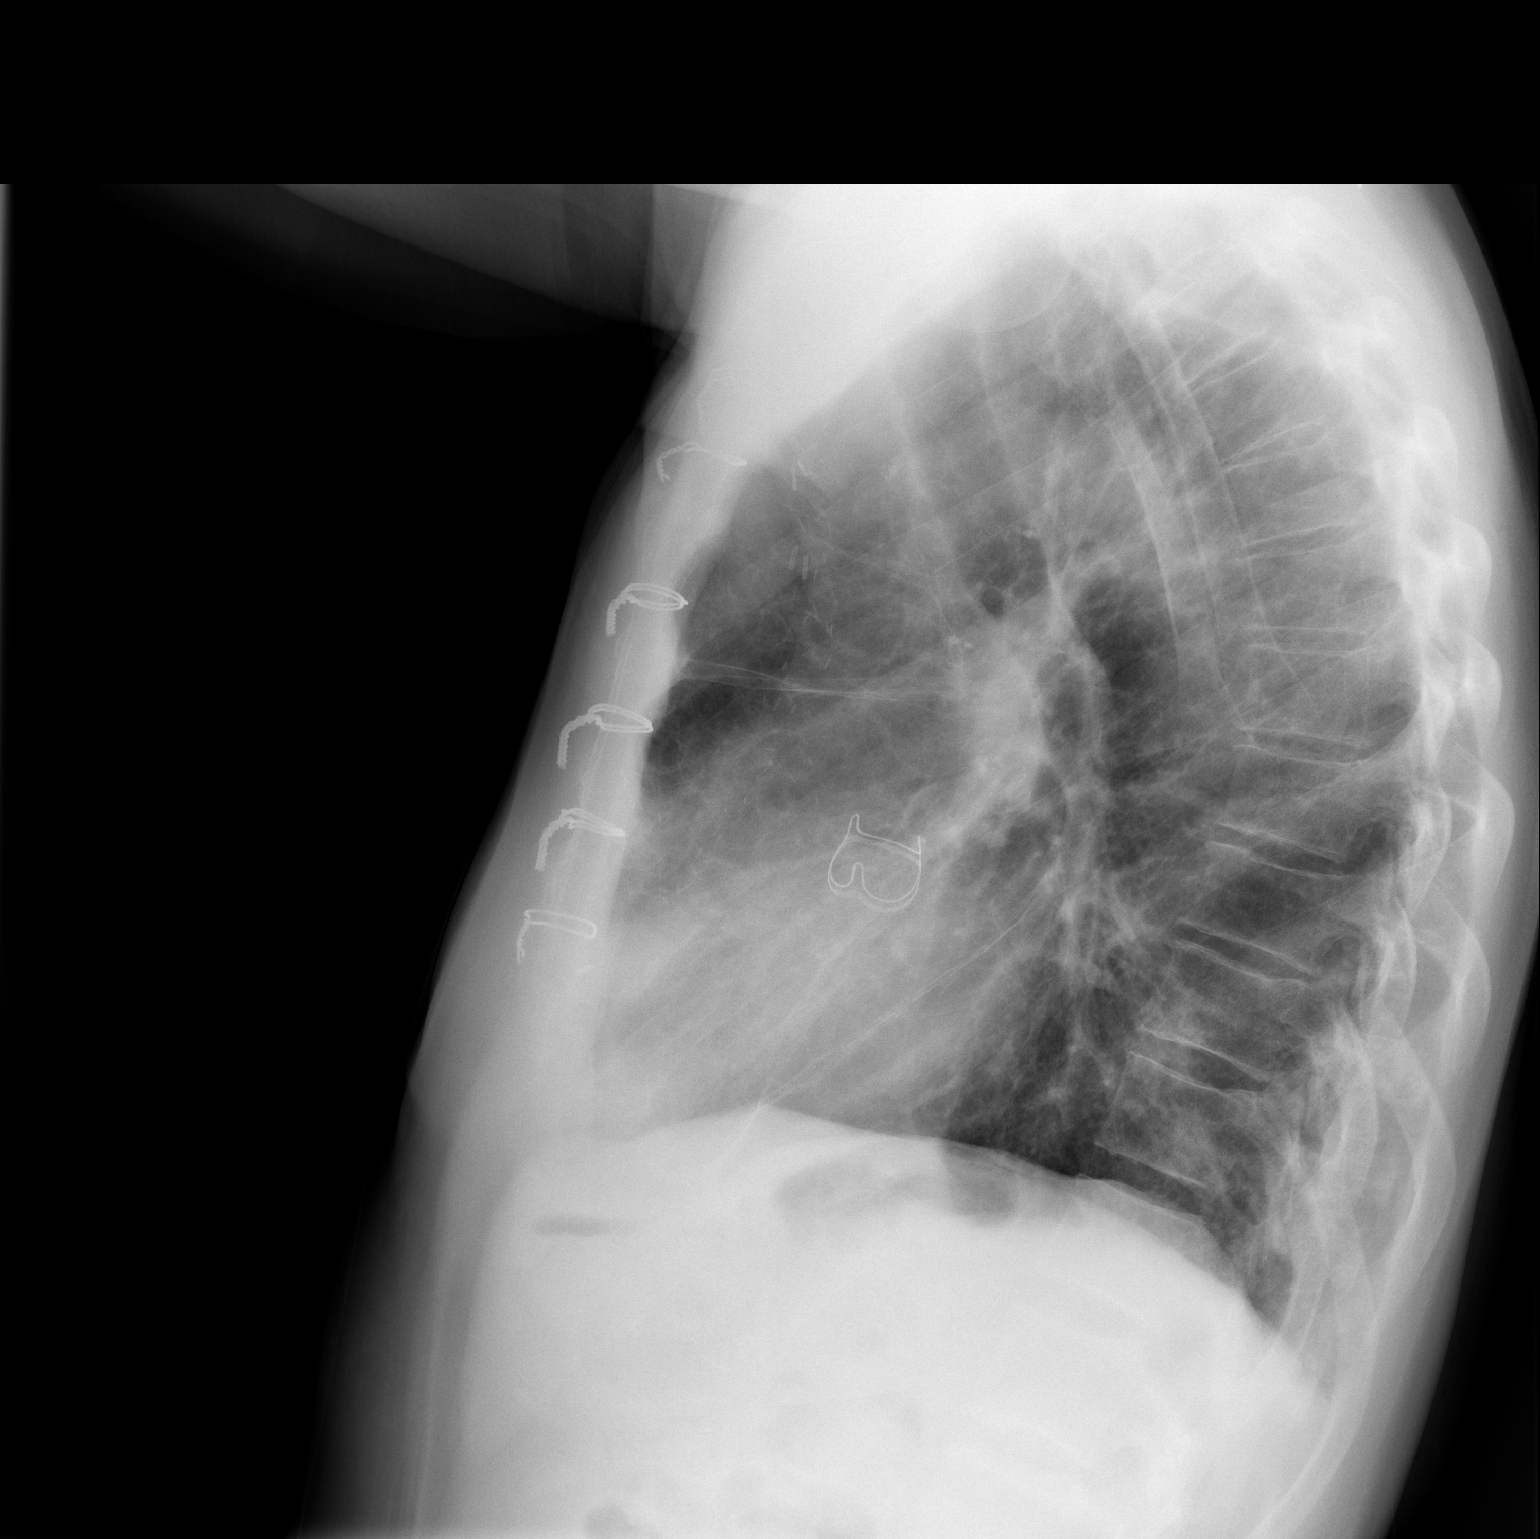

[2 of 2 positions shown; findings below may reference images not displayed]

FINDINGS: The lungs are well-expanded and clear. The heart and pulmonary
vascularity are normal. The mediastinum is normal in width. There is
no pleural effusion. There is persistent thickening of the minor
fissure. The prosthetic aortic valve appears to be in appropriate
position. The sternal wires are intact. There is calcification in
the wall of the aortic arch.
IMPRESSION: There is no CHF nor pneumonia. Persistent thickening of the minor
fissure. Interval clearing of bibasilar atelectasis and small
pleural effusions.

## 2019-06-09 ENCOUNTER — Telehealth: Payer: Self-pay

## 2019-06-09 NOTE — Telephone Encounter (Signed)
Copied from [place] [id number]. Topic: General - Other >> Jun 09, 2019  2:30 PM Rainey Pines A wrote: Patients wife stated that she was returning a call. Did not see any documentation of someone trying to reach patient. Best contact number [phone]

## 2019-06-14 NOTE — Telephone Encounter (Signed)
Talked to patient and he said he is doing well and did not know reason for the call. He is due for his regular follow up appointment, patient was scheduled for 10-27--20

## 2019-06-21 ENCOUNTER — Other Ambulatory Visit: Payer: Self-pay

## 2019-06-22 ENCOUNTER — Telehealth: Payer: Self-pay | Admitting: Family

## 2019-06-22 ENCOUNTER — Ambulatory Visit (INDEPENDENT_AMBULATORY_CARE_PROVIDER_SITE_OTHER): Payer: Medicare Other | Admitting: Family

## 2019-06-22 ENCOUNTER — Encounter: Payer: Self-pay | Admitting: Family

## 2019-06-22 VITALS — BP 153/88 | HR 71 | Temp 97.8°F | Resp 16 | Ht 76.0 in | Wt 179.0 lb

## 2019-06-22 DIAGNOSIS — E782 Mixed hyperlipidemia: Secondary | ICD-10-CM | POA: Diagnosis not present

## 2019-06-22 DIAGNOSIS — I10 Essential (primary) hypertension: Secondary | ICD-10-CM

## 2019-06-22 DIAGNOSIS — E1165 Type 2 diabetes mellitus with hyperglycemia: Secondary | ICD-10-CM | POA: Diagnosis not present

## 2019-06-22 DIAGNOSIS — E1140 Type 2 diabetes mellitus with diabetic neuropathy, unspecified: Secondary | ICD-10-CM

## 2019-06-22 DIAGNOSIS — I2510 Atherosclerotic heart disease of native coronary artery without angina pectoris: Secondary | ICD-10-CM | POA: Diagnosis not present

## 2019-06-22 DIAGNOSIS — IMO0002 Reserved for concepts with insufficient information to code with codable children: Secondary | ICD-10-CM

## 2019-06-22 LAB — COMPREHENSIVE METABOLIC PANEL
ALT: 10 U/L (ref 0–53)
AST: 13 U/L (ref 0–37)
Albumin: 4.4 g/dL (ref 3.5–5.2)
Alkaline Phosphatase: 44 U/L (ref 39–117)
BUN: 12 mg/dL (ref 6–23)
CO2: 29 mEq/L (ref 19–32)
Calcium: 9.5 mg/dL (ref 8.4–10.5)
Chloride: 104 mEq/L (ref 96–112)
Creatinine, Ser: 0.72 mg/dL (ref 0.40–1.50)
GFR: 131.43 mL/min (ref >60.00)
Glucose, Bld: 134 mg/dL — ABNORMAL HIGH (ref 70–99)
Potassium: 4.6 mEq/L (ref 3.5–5.1)
Sodium: 140 mEq/L (ref 135–145)
Total Bilirubin: 0.5 mg/dL (ref 0.2–1.2)
Total Protein: 7 g/dL (ref 6.0–8.3)

## 2019-06-22 LAB — LIPID PANEL
Cholesterol: 124 mg/dL (ref 0–200)
HDL: 43.6 mg/dL (ref >39.00)
LDL Cholesterol: 66 mg/dL (ref 0–99)
NonHDL: 80.65
Total CHOL/HDL Ratio: 3
Triglycerides: 72 mg/dL (ref 0.0–149.0)
VLDL: 14.4 mg/dL (ref 0.0–40.0)

## 2019-06-22 LAB — HEMOGLOBIN A1C: Hgb A1c MFr Bld: 7.5 % — ABNORMAL HIGH (ref 4.6–6.5)

## 2019-06-22 MED ORDER — METOPROLOL TARTRATE 50 MG PO TABS: 50.0000 mg | ORAL_TABLET | Freq: Two times a day (BID) | ORAL | 0 refills | Status: DC

## 2019-06-22 MED ORDER — LEVEMIR FLEXTOUCH 100 UNIT/ML ~~LOC~~ SOPN: PEN_INJECTOR | SUBCUTANEOUS | 0 refills | Status: DC

## 2019-06-22 MED ORDER — HYDRALAZINE HCL 25 MG PO TABS: 25.0000 mg | ORAL_TABLET | Freq: Three times a day (TID) | ORAL | 5 refills | Status: DC

## 2019-06-22 MED ORDER — NOVOLOG FLEXPEN 100 UNIT/ML ~~LOC~~ SOPN: PEN_INJECTOR | SUBCUTANEOUS | 0 refills | Status: DC

## 2019-06-22 MED ORDER — METOPROLOL SUCCINATE ER 25 MG PO TB24: 75.0000 mg | ORAL_TABLET | Freq: Every day | ORAL | 3 refills | Status: DC

## 2019-06-22 NOTE — Telephone Encounter (Signed)
Medical records release faxed to Dr. Maurie Boettcher office

## 2019-06-22 NOTE — Patient Instructions (Addendum)
Please increase your hydralazine to 25mg  three times daily.

## 2019-06-22 NOTE — Progress Notes (Signed)
Subjective:    Patient ID: [name], male    DOB: 1950/12/11, 68 y.o.   MRN: [id number]  HPI  Patient is a 68 yr old male who presents today for follow up.  DM2- reports sugars around 120 (fasting).  Maintained on metformin, levemir 45 units once daily, novolog 7-11 units AC dinner and farxiga. Reports that his last eye exam was 1 year ago.   Lab Results  Component Value Date   HGBA1C 7.8 (H) 09/08/2018   HGBA1C 7.3 (H) 06/10/2018   HGBA1C 7.3 (H) 03/02/2018   Lab Results  Component Value Date   MICROALBUR <0.7 09/08/2018   LDLCALC 72 06/10/2018   CREATININE 0.91 09/17/2018   Hyperlipidemia- maintained on crestor.  Lab Results  Component Value Date   CHOL 133 06/10/2018   HDL 47.80 06/10/2018   LDLCALC 72 06/10/2018   LDLDIRECT 177.6 10/07/2007   TRIG 63.0 06/10/2018   CHOLHDL 3 06/10/2018   HTN- maintained on metoprolol 50mg  bid, hydralazine TID, lasix once daily.   BP Readings from Last 3 Encounters:  06/22/19 (!) 153/88  04/26/19 (!) 154/94  04/02/19 128/74       Review of Systems See HPI  Past Medical History:  Diagnosis Date  . Abnormal myocardial perfusion study 2006   EF 44% ? inferoseptal ischemia. no cath done  . Breast mass, right 03/2008  . Contact dermatitis 01/31/2013  . COPD (chronic obstructive pulmonary disease) ([place])    "dx'd 12/2017"  . Coronary artery disease   . Heart murmur   . History of colonic polyps   . Hyperlipidemia   . Hypertension   . Low back pain   . Type II diabetes mellitus ([place])      Social History   Socioeconomic History  . Marital status: Married    Spouse name: Not on file  . Number of children: Not on file  . Years of education: Not on file  . Highest education level: Not on file  Occupational History  . Not on file  Social Needs  . Financial resource strain: Not on file  . Food insecurity    Worry: Not on file    Inability: Not on file  . Transportation needs    Medical: Not on file   Non-medical: Not on file  Tobacco Use  . Smoking status: Former Smoker    Packs/day: 1.50    Years: 41.00    Pack years: 61.50    Types: Cigarettes    Quit date: 08/26/2010    Years since quitting: 8.8  . Smokeless tobacco: Never Used  . Tobacco comment: Quit 2012  Substance and Sexual Activity  . Alcohol use: Yes    Alcohol/week: 3.0 standard drinks    Types: 3 Glasses of wine per week  . Drug use: Not Currently  . Sexual activity: Not Currently  Lifestyle  . Physical activity    Days per week: Not on file    Minutes per session: Not on file  . Stress: Not on file  Relationships  . Social Herbalist on phone: Not on file    Gets together: Not on file    Attends religious service: Not on file    Active member of club or organization: Not on file    Attends meetings of clubs or organizations: Not on file    Relationship status: Not on file  . Intimate partner violence    Fear of current or ex partner: Not on file  Emotionally abused: Not on file    Physically abused: Not on file    Forced sexual activity: Not on file  Other Topics Concern  . 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 ABDOMINAL AORTOGRAM N/A 01/13/2018   Procedure: ABDOMINAL AORTOGRAM;  Surgeon: Troy Sine, MD;  Location: [facility];  Service: Cardiovascular;  Laterality: N/A;  . AORTIC VALVE REPLACEMENT N/A 03/04/2018   Procedure: AORTIC VALVE REPLACEMENT (AVR) 86mm Edwards Magna Ease Tissue Valve.;  Surgeon: Melrose Nakayama, MD;  Location: [facility];  Service: Open Heart Surgery;  Laterality: N/A;  . CARDIAC VALVE REPLACEMENT    . COLONOSCOPY W/ POLYPECTOMY    . CORONARY ARTERY BYPASS GRAFT N/A 03/04/2018   Procedure: CORONARY ARTERY BYPASS GRAFTING (CABG) x1:  LIMA to LAD.;  Surgeon:  Melrose Nakayama, MD;  Location: [facility];  Service: Open Heart Surgery;  Laterality: N/A;  . LIPOMA EXCISION Left 07/2008    "back of shoulder" Dr Gershon Crane  . RIGHT/LEFT HEART CATH AND CORONARY ANGIOGRAPHY N/A 01/13/2018   Procedure: RIGHT/LEFT HEART CATH AND CORONARY ANGIOGRAPHY;  Surgeon: Troy Sine, MD;  Location: [facility];  Service: Cardiovascular;  Laterality: N/A;  . TEE WITHOUT CARDIOVERSION N/A 03/04/2018   Procedure: TRANSESOPHAGEAL ECHOCARDIOGRAM (TEE);  Surgeon: Melrose Nakayama, MD;  Location: [facility];  Service: Open Heart Surgery;  Laterality: N/A;  . TONSILLECTOMY    . WISDOM TOOTH EXTRACTION      Family History  Problem Relation Age of Onset  . Melanoma Father 61       deceased secondary to melanoma  . Alzheimer's disease Father   . Diabetes Father   . Hypertension Mother        alive -96  . Colon cancer Neg Hx     Allergies  Allergen Reactions  . Amlodipine Besylate Other (See Comments)    REACTION: ? caused left axillary nodules/chest flutter  . Hydrochlorothiazide Hives  . Lisinopril     Hyperkalemia     Current Outpatient Medications on File Prior to Visit  Medication Sig Dispense Refill  . aspirin EC 325 MG EC tablet Take 1 tablet (325 mg total) by mouth daily.  0  . Cyanocobalamin (VITAMIN B-12 PO) Take 1 tablet by mouth daily.    . dapagliflozin propanediol (FARXIGA) 5 MG TABS tablet Take 5 mg by mouth daily. 30 tablet 5  . furosemide (LASIX) 20 MG tablet TAKE 1 TABLET BY MOUTH EVERY DAY 90 tablet 1  . glucose blood (PRODIGY NO CODING BLOOD GLUC) test strip Use as instructed to check blood sugar twice a day.  DX  E11.40 100 each 1  . hydrALAZINE (APRESOLINE) 10 MG tablet Take 1 tablet (10 mg total) by mouth 3 (three) times daily. 90 tablet 3  . Insulin Pen Needle (B-D ULTRAFINE III SHORT PEN) 31G X 8 MM MISC USE AS DIRECTED 100 each 5  . LEVEMIR FLEXTOUCH 100 UNIT/ML Pen INJECT 60 UNITS INTO THE SKIN DAILY AT 10 PM. 15 mL 0  . metFORMIN  (GLUCOPHAGE) 1000 MG tablet Take 1 tablet (1,000 mg total) by mouth 2 (two) times daily with a meal. 60 tablet 5  . metoprolol tartrate (LOPRESSOR)  50 MG tablet TAKE 1 TABLET BY MOUTH TWICE A DAY 180 tablet 0  . NOVOLOG FLEXPEN 100 UNIT/ML FlexPen INJECT 11-15 SUBCUTANEOUSLY DAILY BEFORE SUPPER 15 mL 0  . rosuvastatin (CRESTOR) 20 MG tablet TAKE 1 TABLET BY MOUTH EVERY DAY 90 tablet 1  . tadalafil (CIALIS) 5 MG tablet Take 5 mg by mouth daily as needed for erectile dysfunction.     No current facility-administered medications on file prior to visit.     BP (!) 153/88 (BP Location: Right Arm, Patient Position: Sitting, Cuff Size: Small)   Pulse 71   Temp 97.8 F (36.6 C) (Temporal)   Resp 16   Ht 6\' 4"  (1.93 m)   Wt 179 lb (81.2 kg)   SpO2 97%   BMI 21.79 kg/m       Objective:   Physical Exam Constitutional:      General: He is not in acute distress.    Appearance: He is well-developed.  HENT:     Head: Normocephalic and atraumatic.  Cardiovascular:     Rate and Rhythm: Normal rate and regular rhythm.     Heart sounds: No murmur.  Pulmonary:     Effort: Pulmonary effort is normal. No respiratory distress.     Breath sounds: Normal breath sounds. No wheezing or rales.  Skin:    General: Skin is warm and dry.  Neurological:     Mental Status: He is alert and oriented to person, place, and time.  Psychiatric:        Behavior: Behavior normal.        Thought Content: Thought content normal.           Assessment & Plan:  HTN- BP is above goal the last 2 readings. Will increase hydralazine to 25mg  TID.  Continue current dose of metoprolol (Toprol xl sent in error to his pharmacy and this rx has been cancelled).    DM2- clinically stable on current meds. Continue same. Obtain a1c.  Hyperlipidemia- tolerating statin, obtain follow up lipid panel.

## 2019-06-22 NOTE — Telephone Encounter (Signed)
Please advise pt that sugar remains slightly above goal. I would like him to increase his levemir from 45 units to 50 units once daily.

## 2019-06-22 NOTE — Telephone Encounter (Signed)
Could you please call Dr. Delman Cheadle ophthalmology and request copy of last eye exam?

## 2019-06-23 ENCOUNTER — Telehealth: Payer: Self-pay | Admitting: Pulmonary Disease

## 2019-06-23 NOTE — Telephone Encounter (Signed)
Last sleep study in 02/03/18. I called [phone] (Melissa's line) Confirmed the phone number and fax number left do not belong to [facility] based on internet search.  Rep (was not Melissa) will call me back. She is going to check into this because the ONO should not be needed.Will call back tomorrow. With update.

## 2019-06-23 NOTE — Telephone Encounter (Signed)
Patient advised of results and providers advise to in crease medication by 5 units.

## 2019-06-24 NOTE — Telephone Encounter (Signed)
Called and spoke to [place], with [facility]. She states there is a discrepancy with the paperwork (testing dates and visit dates) an insurance is not accepting the data. She is still investigating this and will get back to [place].

## 2019-06-24 NOTE — Telephone Encounter (Signed)
Called and spoke to [place] with [employer]. She states per pt's record he looks like he was re- qualified in August. She states she will look into this a bit further and call back. Will await call.

## 2019-06-28 ENCOUNTER — Telehealth: Payer: Self-pay | Admitting: Family

## 2019-06-28 ENCOUNTER — Other Ambulatory Visit: Payer: Self-pay

## 2019-06-28 ENCOUNTER — Ambulatory Visit (INDEPENDENT_AMBULATORY_CARE_PROVIDER_SITE_OTHER): Payer: Medicare Other | Admitting: Family

## 2019-06-28 DIAGNOSIS — I10 Essential (primary) hypertension: Secondary | ICD-10-CM | POA: Diagnosis not present

## 2019-06-28 NOTE — Telephone Encounter (Signed)
Return in about 3 months (around 09/28/2019) for follow up visit... Called pt to sch. No answer. Unable to leave msg

## 2019-06-28 NOTE — Progress Notes (Signed)
Virtual Visit via Telephone Note  I connected with [name] on 06/28/19 at  8:00 AM EST by telephone and verified that I am speaking with the correct person using two identifiers.  Location: Patient: home Provider: home   I discussed the limitations, risks, security and privacy concerns of performing an evaluation and management service by telephone and the availability of in person appointments. I also discussed with the patient that there may be a patient responsible charge related to this service. The patient expressed understanding and agreed to proceed.   History of Present Illness:  Patient is a 68 yr old male who presents today for follow up of his blood pressure.  We last saw him on 10/27 and BP was noted to be above goal.  We increased his hydralazine from 25mg  bid to 25mg  tid. 132/85.     Observations/Objective:   Gen: Awake, alert, no acute distress Resp: Breathing sounds even and non-labored Psych: calm/pleasant demeanor Neuro: Alert and Oriented x 3, + facial symmetry, speech is clear.    Assessment and Plan:  HTN- bp is improved on current dose of hydralazine. Will continue same.   Reviewed lab results from 06/22/19 with him. Questions answered.   Follow Up Instructions:    I discussed the assessment and treatment plan with the patient. The patient was provided an opportunity to ask questions and all were answered. The patient agreed with the plan and demonstrated an understanding of the instructions.   The patient was advised to call back or seek an in-person evaluation if the symptoms worsen or if the condition fails to improve as anticipated.  I provided 5 minutes of non-face-to-face time during this encounter.   Nance Pear, NP

## 2019-06-28 NOTE — Telephone Encounter (Signed)
Sent a community message to Melissa to follow up on this matter. 

## 2019-07-01 NOTE — Telephone Encounter (Signed)
No community message reply in [name]'s box. Will await response.

## 2019-07-02 NOTE — Telephone Encounter (Signed)
Still have not received a response to my community message sent to [facility]. Will follow up.

## 2019-07-04 NOTE — Progress Notes (Signed)
@Patient  ID: [name], male    DOB: May 24, 1951, 68 y.o.   MRN: [id number]  Chief Complaint  Patient presents with  . Follow-up    3 month f/u for COPD. States his breathing has ok since last visit.     Referring provider: Debbrah Alar, NP  HPI:  68 year old male former smoker initially referred to our office on 02/02/2018 for dyspnea.  Diagnosed with Gold II COPD, emphysema, Chronic respiratory failure requiring oxygen at night.   PMH: Severe aortic stenosis, status post AVR, CAD, cardiomyopathy, dyslipidemia Smoker/ Smoking History: Former smoker.  61.5-pack-year smoking history.  Quit 2012. Maintenance:  none, patient has previously failed inhaler therapy and found no benefit, including albuterol rescue inhalers, currently not maintained on any inhalers Pt of: Dr. Lake Bells  07/05/2019  - Visit   68 year old male former smoker followed in our office for COPD.  Since last being seen patient is established with a lung cancer screening program.  He completed a lung cancer screening CT in August/2020 which was a lung RADS 1.  He will repeat this in 1 year.  He continues to use oxygen only at night.  He has completed an overnight oximetry this year to further justify insurance requirements for documentations to support his oxygen use.   Questionaires / Pulmonary Flowsheets:   MMRC: mMRC Dyspnea Scale mMRC Score  07/05/2019 1    Tests:    04/26/2019-CT lung cancer screening CT-lung RADS 1, negative  04/09/18 >>> ONO - telephone note>>> Patient's ONO on room air showed that he was less than 88% for more than an hour. Patient needs to be on 2 L at HS.  10/06/2018-overnight oximetry-entire duration was 4 hours and 23 minutes, SPO2 less than 88% for 25 minutes and 32 seconds  02/04/2018-high-res CT- no evidence of interstitial lung disease, severe centrilobular emphysema and mild diffuse bronchial wall thickening suggesting COPD, no calcified pleural plaques no pleural  effusion, mild smooth bilateral pleural thickening which is nonspecific  05/21/2018-echocardiogram-LV ejection fraction 40 to [id number], grade 1 diastolic dysfunction  [id number] function test- FVC 3.37 (71% predicted), postbronchodilator ratio 50, postbronchodilator FEV1 54, mid flow reversibility after bronchodilator, DLCO 36 >>>Moderate airflow obstruction, diminished diffusion capacity  02/03/2018-home sleep study- AHI 6 an hour, SaO2 low 85%  Echo: 11/2017 TTE> Critical AS, LVEF 35-40%, mod AR, RVSP 41 mmHg  Heart Catheterization: 12/2017 LHC and RHC showed severe AS, non-obstructive multi-vessel CAD, PA mean 30   FENO:  No results found for: NITRICOXIDE  PFT: PFT Results Latest Ref Rng & Units 02/04/2018  FVC-Pre L 3.37  FVC-Predicted Pre % 71  FVC-Post L 3.88  FVC-Predicted Post % 82  Pre FEV1/FVC % % 53  Post FEV1/FCV % % 50  FEV1-Pre L 1.78  FEV1-Predicted Pre % 49  FEV1-Post L 1.96  DLCO UNC% % 36  DLCO COR %Predicted % 57  TLC L 7.36  TLC % Predicted % 91  RV % Predicted % 125    WALK:  SIX MIN WALK 10/12/2018 08/25/2018  Supplimental Oxygen during Test? (L/min) No No  Tech Comments: Fast paced walk with no sob noted.  544ft completed. pt completed 761ft with no complaints or desats.  moderate pace    Imaging: No results found.  Lab Results:  CBC    Component Value Date/Time   WBC 11.8 (H) 03/08/2018 0538   RBC 4.84 03/08/2018 0538   HGB 11.0 (L) 03/08/2018 0538   HCT 36.4 (L) 03/08/2018 0538   PLT 361 03/08/2018 0538  MCV 75.2 (L) 03/08/2018 0538   MCH 22.7 (L) 03/08/2018 0538   MCHC 30.2 03/08/2018 0538   RDW 15.1 03/08/2018 0538   LYMPHSABS 1.9 12/30/2017 0858   MONOABS 0.7 12/30/2017 0858   EOSABS 0.1 12/30/2017 0858   BASOSABS 0.0 12/30/2017 0858    BMET    Component Value Date/Time   NA 140 06/22/2019 0903   NA 145 (H) 07/30/2018 1632   K 4.6 06/22/2019 0903   CL 104 06/22/2019 0903   CO2 29 06/22/2019 0903   GLUCOSE 134 (H)  06/22/2019 0903   BUN 12 06/22/2019 0903   BUN 15 07/30/2018 1632   CREATININE 0.72 06/22/2019 0903   CREATININE 0.84 11/30/2013 0856   CALCIUM 9.5 06/22/2019 0903   GFRNONAA 81 07/30/2018 1632   GFRNONAA >89 10/26/2013 0906   GFRAA 94 07/30/2018 1632   GFRAA >89 10/26/2013 0906    BNP No results found for: BNP  ProBNP No results found for: PROBNP  Specialty Problems      Pulmonary Problems   Dyspnea on exertion   Centrilobular emphysema (HCC)    02/04/2018-high-res CT- no evidence of interstitial lung disease, severe centrilobular emphysema and mild diffuse bronchial wall thickening suggesting COPD, no calcified pleural plaques no pleural effusion, mild smooth bilateral pleural thickening which is nonspecific  02/04/2018-pulmonary function test- FVC 3.37 (71% predicted), postbronchodilator ratio 50, postbronchodilator FEV1 54, mid flow reversibility after bronchodilator, DLCO 36 >>>Moderate airflow obstruction, diminished diffusion capacity      COPD GOLD II A     02/04/2018-high-res CT- no evidence of interstitial lung disease, severe centrilobular emphysema and mild diffuse bronchial wall thickening suggesting COPD, no calcified pleural plaques no pleural effusion, mild smooth bilateral pleural thickening which is nonspecific  02/04/2018-pulmonary function test- FVC 3.37 (71% predicted), postbronchodilator ratio 50, postbronchodilator FEV1 54, mid flow reversibility after bronchodilator, DLCO 36 >>>Moderate airflow obstruction, diminished diffusion capacity      Nocturnal hypoxemia    04/09/18 >>> ONO - telephone note>>> Patient's ONO on room air showed that he was less than 88% for more than an hour. Patient needs to be on 2 L at HS.  10/06/2018-overnight oximetry-entire duration was 4 hours and 23 minutes, SPO2 less than 88% for 25 minutes and 32 seconds           Allergies  Allergen Reactions  . Amlodipine Besylate Other (See Comments)    REACTION: ? caused left  axillary nodules/chest flutter  . Hydrochlorothiazide Hives  . Lisinopril     Hyperkalemia     Immunization History  Administered Date(s) Administered  . Fluad Quad(high Dose 65+) 05/26/2019  . Influenza Split 08/04/2012  . Influenza Whole 05/20/2008, 07/12/2009, 05/02/2010  . Influenza, High Dose Seasonal PF 05/20/2017, 06/10/2018  . Influenza,inj,Quad PF,6+ Mos 05/07/2013, 05/09/2014, 06/07/2015, 05/01/2016  . Pneumococcal Conjugate-13 02/05/2017  . Pneumococcal Polysaccharide-23 05/20/2008, 08/25/2018  . Td 08/26/2008    Past Medical History:  Diagnosis Date  . Abnormal myocardial perfusion study 2006   EF 44% ? inferoseptal ischemia. no cath done  . Breast mass, right 03/2008  . Contact dermatitis 01/31/2013  . COPD (chronic obstructive pulmonary disease) ([place])    "dx'd 12/2017"  . Coronary artery disease   . Heart murmur   . History of colonic polyps   . Hyperlipidemia   . Hypertension   . Low back pain   . Type II diabetes mellitus (HCC)     Tobacco History: Social History   Tobacco Use  Smoking Status Former Smoker  .  Packs/day: 1.50  . Years: 41.00  . Pack years: 61.50  . Types: Cigarettes  . Quit date: 08/26/2010  . Years since quitting: 8.8  Smokeless Tobacco Never Used  Tobacco Comment   Quit 2012   Counseling given: Yes Comment: Quit 2012  Continue to not smoke  Outpatient Encounter Medications as of 07/05/2019  Medication Sig  . aspirin EC 325 MG EC tablet Take 1 tablet (325 mg total) by mouth daily.  . Cyanocobalamin (VITAMIN B-12 PO) Take 1 tablet by mouth daily.  . dapagliflozin propanediol (FARXIGA) 5 MG TABS tablet Take 5 mg by mouth daily.  . furosemide (LASIX) 20 MG tablet TAKE 1 TABLET BY MOUTH EVERY DAY  . glucose blood (PRODIGY NO CODING BLOOD GLUC) test strip Use as instructed to check blood sugar twice a day.  DX  E11.40  . hydrALAZINE (APRESOLINE) 25 MG tablet Take 1 tablet (25 mg total) by mouth 3 (three) times daily.  . insulin  aspart (NOVOLOG FLEXPEN) 100 UNIT/ML FlexPen INJECT 7-11 SUBCUTANEOUSLY DAILY BEFORE SUPPER  . Insulin Detemir (LEVEMIR FLEXTOUCH) 100 UNIT/ML Pen INJECT 50 UNITS INTO THE SKIN DAILY AT 10 PM.  . Insulin Pen Needle (B-D ULTRAFINE III SHORT PEN) 31G X 8 MM MISC USE AS DIRECTED  . metFORMIN (GLUCOPHAGE) 1000 MG tablet Take 1 tablet (1,000 mg total) by mouth 2 (two) times daily with a meal. (Patient taking differently: Take 1,000 mg by mouth daily with breakfast. )  . metoprolol tartrate (LOPRESSOR) 50 MG tablet Take 1 tablet (50 mg total) by mouth 2 (two) times daily.  . rosuvastatin (CRESTOR) 20 MG tablet TAKE 1 TABLET BY MOUTH EVERY DAY  . tadalafil (CIALIS) 5 MG tablet Take 5 mg by mouth daily as needed for erectile dysfunction.  . [DISCONTINUED] hydrALAZINE (APRESOLINE) 10 MG tablet Take 1 tablet (10 mg total) by mouth 3 (three) times daily.  . [DISCONTINUED] LEVEMIR FLEXTOUCH 100 UNIT/ML Pen INJECT 60 UNITS INTO THE SKIN DAILY AT 10 PM.  . [DISCONTINUED] metoprolol tartrate (LOPRESSOR) 50 MG tablet TAKE 1 TABLET BY MOUTH TWICE A DAY  . [DISCONTINUED] NOVOLOG FLEXPEN 100 UNIT/ML FlexPen INJECT 11-15 SUBCUTANEOUSLY DAILY BEFORE SUPPER   No facility-administered encounter medications on file as of 07/05/2019.      Review of Systems  Review of Systems  Constitutional: Negative for activity change, chills, fatigue, fever and unexpected weight change.  HENT: Negative for postnasal drip, rhinorrhea, sinus pressure, sinus pain and sore throat.   Eyes: Negative.   Respiratory: Negative for cough, shortness of breath and wheezing.   Cardiovascular: Negative for chest pain and palpitations.  Gastrointestinal: Negative for constipation, diarrhea, nausea and vomiting.  Endocrine: Negative.   Genitourinary: Negative.   Musculoskeletal: Negative.   Skin: Negative.   Neurological: Negative for dizziness and headaches.  Psychiatric/Behavioral: Negative.  Negative for dysphoric mood. The patient is not  nervous/anxious.   All other systems reviewed and are negative.    Physical Exam  BP 126/82 (BP Location: Left Arm, Patient Position: Sitting, Cuff Size: Normal)   Pulse 75   Temp 97.8 F (36.6 C) (Temporal)   Ht 6\' 4"  (1.93 m)   Wt 178 lb 12.8 oz (81.1 kg)   SpO2 96%   BMI 21.76 kg/m   Wt Readings from Last 5 Encounters:  07/05/19 178 lb 12.8 oz (81.1 kg)  06/22/19 179 lb (81.2 kg)  04/26/19 179 lb 9.6 oz (81.5 kg)  04/02/19 179 lb (81.2 kg)  10/12/18 181 lb (82.1 kg)  BMI Readings from Last 5 Encounters:  07/05/19 21.76 kg/m  06/22/19 21.79 kg/m  04/26/19 21.86 kg/m  04/02/19 21.79 kg/m  10/12/18 23.24 kg/m     Physical Exam Vitals signs and nursing note reviewed.  Constitutional:      General: He is not in acute distress.    Appearance: Normal appearance. He is normal weight.  HENT:     Head: Normocephalic and atraumatic.     Right Ear: Hearing, tympanic membrane, ear canal and external ear normal. There is no impacted cerumen.     Left Ear: Hearing, tympanic membrane, ear canal and external ear normal. There is impacted cerumen.     Nose: No mucosal edema.     Right Turbinates: Not enlarged.     Left Turbinates: Not enlarged.  Eyes:     Pupils: Pupils are equal, round, and reactive to light.  Neck:     Musculoskeletal: Normal range of motion.  Cardiovascular:     Rate and Rhythm: Normal rate and regular rhythm.     Pulses: Normal pulses.     Heart sounds: Normal heart sounds. No murmur.  Pulmonary:     Effort: Pulmonary effort is normal.     Breath sounds: Normal breath sounds. No decreased breath sounds, wheezing or rales.  Musculoskeletal:     Right lower leg: No edema.     Left lower leg: No edema.  Lymphadenopathy:     Cervical: No cervical adenopathy.  Skin:    General: Skin is warm and dry.     Capillary Refill: Capillary refill takes less than 2 seconds.     Findings: No erythema or rash.  Neurological:     General: No focal deficit  present.     Mental Status: He is alert and oriented to person, place, and time.     Motor: No weakness.     Coordination: Coordination normal.     Gait: Gait is intact. Gait normal.  Psychiatric:        Mood and Affect: Mood normal.        Behavior: Behavior normal. Behavior is cooperative.        Thought Content: Thought content normal.        Judgment: Judgment normal.       Assessment & Plan:   COPD GOLD II A  Plan: Continue to remain physically active Continue oxygen therapy 2 L at night Can use rescue inhaler as needed Follow-up in 6 months to establish care with Dr. Carlis Abbott this needs to be a 30-minute visit  Nocturnal hypoxemia Assessment: 10/06/2018 overnight oximetry results showed the patient was below 88% for 25 minutes and 32 seconds  Plan: Continue 2 L of O2 at night Walk today in office patient completed 3 laps without any oxygen desaturations on room airPlan: Continue 2 L of O2 at night  Preventative health care Up-to-date with flu vaccines and pneumonia vaccines  Former cigarette smoker Aug/2020 - lcs screening ct - lung rads 1   Plan: Complete lung cancer screening CT in August/2021    Return in about 6 months (around 01/02/2020), or if symptoms worsen or fail to improve, for Follow up with Dr. Carlis Abbott - 65min to establish.   Lauraine Rinne, NP 07/05/2019   This appointment was 26 minutes long with over 50% of the time in direct face-to-face patient care, assessment, plan of care, and follow-up.

## 2019-07-05 ENCOUNTER — Encounter: Payer: Self-pay | Admitting: Pulmonary Disease

## 2019-07-05 ENCOUNTER — Ambulatory Visit (INDEPENDENT_AMBULATORY_CARE_PROVIDER_SITE_OTHER): Payer: Medicare Other | Admitting: Pulmonary Disease

## 2019-07-05 ENCOUNTER — Other Ambulatory Visit: Payer: Self-pay

## 2019-07-05 VITALS — BP 126/82 | HR 75 | Temp 97.8°F | Ht 76.0 in | Wt 178.8 lb

## 2019-07-05 DIAGNOSIS — Z87891 Personal history of nicotine dependence: Secondary | ICD-10-CM | POA: Diagnosis not present

## 2019-07-05 DIAGNOSIS — G4734 Idiopathic sleep related nonobstructive alveolar hypoventilation: Secondary | ICD-10-CM

## 2019-07-05 DIAGNOSIS — Z0000 Encounter for general adult medical examination without abnormal findings: Secondary | ICD-10-CM

## 2019-07-05 DIAGNOSIS — J449 Chronic obstructive pulmonary disease, unspecified: Secondary | ICD-10-CM

## 2019-07-05 NOTE — Assessment & Plan Note (Addendum)
Assessment: 10/06/2018 overnight oximetry results showed the patient was below 88% for 25 minutes and 32 seconds  Plan: Continue 2 L of O2 at night Walk today in office patient completed 3 laps without any oxygen desaturations on room airPlan: Continue 2 L of O2 at night

## 2019-07-05 NOTE — Assessment & Plan Note (Signed)
Aug/2020 - lcs screening ct - lung rads 1   Plan: Complete lung cancer screening CT in August/2021

## 2019-07-05 NOTE — Assessment & Plan Note (Signed)
Up-to-date with flu vaccines and pneumonia vaccines

## 2019-07-05 NOTE — Assessment & Plan Note (Signed)
Plan: Continue to remain physically active Continue oxygen therapy 2 L at night Can use rescue inhaler as needed Follow-up in 6 months to establish care with Dr. Carlis Abbott this needs to be a 30-minute visit

## 2019-07-05 NOTE — Patient Instructions (Addendum)
You were seen today by Lauraine Rinne, NP  for:   1. COPD GOLD II A   Note your daily symptoms > remember "red flags" for COPD:   >>>Increase in cough >>>increase in sputum production >>>increase in shortness of breath or activity  intolerance.   If you notice these symptoms, please call the office to be seen.    2. Nocturnal hypoxemia  Continue oxygen therapy as prescribed  >>>maintain oxygen saturations greater than 88 percent  >>>if unable to maintain oxygen saturations please contact the office  >>>do not smoke with oxygen  >>>can use nasal saline gel or nasal saline rinses to moisturize nose if oxygen causes dryness   3. Preventative health care  Up-to-date on your vaccines    Follow Up:    Return in about 6 months (around 01/02/2020), or if symptoms worsen or fail to improve, for Follow up with Dr. Carlis Abbott - 85min to establish.   Please do your part to reduce the spread of COVID-19:      Reduce your risk of any infection  and COVID19 by using the similar precautions used for avoiding the common cold or flu:  Marland Kitchen Wash your hands often with soap and warm water for at least 20 seconds.  If soap and water are not readily available, use an alcohol-based hand sanitizer with at least 60% alcohol.  . If coughing or sneezing, cover your mouth and nose by coughing or sneezing into the elbow areas of your shirt or coat, into a tissue or into your sleeve (not your hands). Langley Gauss A MASK when in public  . Avoid shaking hands with others and consider head nods or verbal greetings only. . Avoid touching your eyes, nose, or mouth with unwashed hands.  . Avoid close contact with people who are sick. . Avoid places or events with large numbers of people in one location, like concerts or sporting events. . If you have some symptoms but not all symptoms, continue to monitor at home and seek medical attention if your symptoms worsen. . If you are having a medical emergency, call 911.    [place] / e-Visit: [email or web address]         [facility]: [facility]: [id number]                   [facility]: [id number]     It is flu season:   >>> Best ways to protect herself from the flu: Receive the yearly flu vaccine, practice good hand hygiene washing with soap and also using hand sanitizer when available, eat a nutritious meals, get adequate rest, hydrate appropriately   Please contact the office if your symptoms worsen or you have concerns that you are not improving.   Thank you for choosing [facility] for your healthcare, and for allowing [place] to partner with you on your healthcare journey. I am thankful to be able to provide care to you today.   [name] FNP-C     COPD and Physical Activity Chronic obstructive pulmonary disease (COPD) is a long-term (chronic) condition that affects the lungs. COPD is a general term that can be used to describe many different lung problems that cause lung swelling (inflammation) and limit airflow, including chronic bronchitis and emphysema. The main symptom of COPD is shortness of breath, which makes it harder to do even simple tasks. This can also make it harder  to exercise and be active. Talk with your health care provider about treatments to help you breathe better and actions you can take to prevent breathing problems during physical activity. What are the benefits of exercising with COPD? Exercising regularly is an important part of a healthy lifestyle. You can still exercise and do physical activities even though you have COPD. Exercise and physical activity improve your shortness of breath by increasing blood flow (circulation). This causes your heart to pump more oxygen through your body. Moderate exercise can improve your:  Oxygen use.  Energy level.   Shortness of breath.  Strength in your breathing muscles.  Heart health.  Sleep.  Self-esteem and feelings of self-worth.  Depression, stress, and anxiety levels. Exercise can benefit everyone with COPD. The severity of your disease may affect how hard you can exercise, especially at first, but everyone can benefit. Talk with your health care provider about how much exercise is safe for you, and which activities and exercises are safe for you. What actions can I take to prevent breathing problems during physical activity?  Sign up for a pulmonary rehabilitation program. This type of program may include: ? Education about lung diseases. ? Exercise classes that teach you how to exercise and be more active while improving your breathing. This usually involves:  Exercise using your lower extremities, such as a stationary bicycle.  About 30 minutes of exercise, 2 to 5 times per week, for 6 to 12 weeks  Strength training, such as push ups or leg lifts. ? Nutrition education. ? Group classes in which you can talk with others who also have COPD and learn ways to manage stress.  If you use an oxygen tank, you should use it while you exercise. Work with your health care provider to adjust your oxygen for your physical activity. Your resting flow rate is different from your flow rate during physical activity.  While you are exercising: ? Take slow breaths. ? Pace yourself and do not try to go too fast. ? Purse your lips while breathing out. Pursing your lips is similar to a kissing or whistling position. ? If doing exercise that uses a quick burst of effort, such as weight lifting:  Breathe in before starting the exercise.  Breathe out during the hardest part of the exercise (such as raising the weights). Where to find support You can find support for exercising with COPD from:  Your health care provider.  A pulmonary rehabilitation program.  Your local health department or community  health programs.  Support groups, online or in-person. Your health care provider may be able to recommend support groups. Where to find more information You can find more information about exercising with COPD from:  American Lung Association: ClassInsider.se.  COPD Foundation: https://www.rivera.net/. Contact a health care provider if:  Your symptoms get worse.  You have chest pain.  You have nausea.  You have a fever.  You have trouble talking or catching your breath.  You want to start a new exercise program or a new activity. Summary  COPD is a general term that can be used to describe many different lung problems that cause lung swelling (inflammation) and limit airflow. This includes chronic bronchitis and emphysema.  Exercise and physical activity improve your shortness of breath by increasing blood flow (circulation). This causes your heart to provide more oxygen to your body.  Contact your health care provider before starting any exercise program or new activity. Ask your health care provider what exercises  and activities are safe for you. This information is not intended to replace advice given to you by your health care provider. Make sure you discuss any questions you have with your health care provider. Document Released: 09/04/2017 Document Revised: 12/02/2018 Document Reviewed: 09/04/2017 Elsevier Patient Education  2020 [employer].

## 2019-07-06 NOTE — Telephone Encounter (Signed)
Checked [name]'s box and there is no community message reply in there. Will await response.

## 2019-07-07 NOTE — Telephone Encounter (Signed)
Agreed.    I need to speak with Melissa directly or one of her supervisors.  This is a completely inappropriate situation regarding this patient.  Patient only uses oxygen at night.  We requalified the patient in February of this year based off of [employer] request.  I spoke with Melissa at that time regarding this.  Unsure why [employer] is now requesting this and asking for [place] to requalify and have a third overnight oximetry test.  They also will need to specifically contact the patient and further explain why the patient is now having to jump through more hoops as he gives a poor reflection of our office when this directly is related to the disorganization of [employer].[name] FNP

## 2019-07-07 NOTE — Telephone Encounter (Signed)
Patient had an OV with Aaron Edelman on 07/05/2019. He was walked during that OV and did not desat during his OV. I asked him if he uses his O2 during the day, he replied "No, I only use it at night. Never used it during the day."  Spoke with Aaron Edelman. He has requested to speak directly with Melissa. I left a message for Melissa to call back to see if she will be available for a discussion with Aaron Edelman tomorrow.   Will route to [place].

## 2019-07-07 NOTE — Telephone Encounter (Signed)
Stenson, Jacklynn Barnacle C, CMA        So sorry Ria Comment that it has taken me so long to get back to you.   In a nutshell, this patient has been receiving free oxygen from [place] for quite some time now due to something that went wrong with the original qualifying documentation.   Can you all please just qualify him from scratch the next time he comes in for an office visit? We'll need a new order and new sats along with the office visit note.   Thank you for your patience and assistance!!  Melissa

## 2019-07-07 NOTE — Telephone Encounter (Signed)
Patient was seen by Aaron Edelman earlier this week and I sent Melissa a message as well to follow up on this. Will continue to wait to see if Ria Comment or myself will receive any message regarding this. Patient is aware that we are working on this.

## 2019-07-08 NOTE — Telephone Encounter (Signed)
07/08/2019 0933  Called and spoke with [facility] with [employer].  Explained my frustration and confusion regarding this patient's plan of care and work-up.  I explained that we will not be ordering additional testing until we have further information of exactly what is gone wrong over these past months.  Until [employer] can explain this to me explicitly and then follow-up with the patient and explained to them explicitly then we can consider moving forward with testing.  Melissa did explain that [employer] does not charge patients for overnight oximetry test.  This is good news.  Melissa reports that she will follow-up with her chain of command and will have somebody reach out to speak with me directly.  I have given them my office number as well as my cell phone number.  [name], FNP

## 2019-07-08 NOTE — Progress Notes (Signed)
Reviewed, agree 

## 2019-07-08 NOTE — Telephone Encounter (Signed)
Left message for patient to call back  

## 2019-07-09 ENCOUNTER — Other Ambulatory Visit: Payer: Self-pay | Admitting: Cardiology

## 2019-07-12 NOTE — Telephone Encounter (Signed)
LMTCB

## 2019-07-13 NOTE — Telephone Encounter (Signed)
Called and spoke to pt. Informed him of the update from [place] and the pt does not need any additional testing. Pt verbalized understanding and denied any further questions or concerns at this time.

## 2019-07-13 NOTE — Telephone Encounter (Signed)
Aaron Edelman - have you heard from [employer]?

## 2019-07-13 NOTE — Telephone Encounter (Signed)
I do apologize yes I heard from [employer] today (07/13/2019).  I spoke with Pattricia Boss.  He informed me that this was a documentation error on [employer] behalf.  Melissa should be coming to our office with a hard copy of the form for me to sign.    The patient does not need additional testing.  We will route to the [facility] as well as [place] as a heads up.  [name] FNP

## 2019-07-13 NOTE — Telephone Encounter (Signed)
Received the CMN from [place] this afternoon. Aaron Edelman has signed the form. Section B has been completed. Will fax back to Melissa's attention in the morning.

## 2019-07-18 ENCOUNTER — Other Ambulatory Visit: Payer: Self-pay | Admitting: Family

## 2019-08-08 ENCOUNTER — Other Ambulatory Visit: Payer: Self-pay | Admitting: Family

## 2019-08-09 ENCOUNTER — Other Ambulatory Visit: Payer: Self-pay

## 2019-08-09 ENCOUNTER — Telehealth: Payer: Self-pay | Admitting: Emergency Medicine

## 2019-08-09 ENCOUNTER — Ambulatory Visit: Payer: Medicare Other | Admitting: Cardiology

## 2019-08-09 ENCOUNTER — Telehealth (INDEPENDENT_AMBULATORY_CARE_PROVIDER_SITE_OTHER): Payer: Medicare Other | Admitting: Cardiology

## 2019-08-09 ENCOUNTER — Encounter: Payer: Self-pay | Admitting: Cardiology

## 2019-08-09 VITALS — BP 131/80 | HR 72 | Wt 186.0 lb

## 2019-08-09 DIAGNOSIS — I420 Dilated cardiomyopathy: Secondary | ICD-10-CM

## 2019-08-09 DIAGNOSIS — Z952 Presence of prosthetic heart valve: Secondary | ICD-10-CM

## 2019-08-09 DIAGNOSIS — I10 Essential (primary) hypertension: Secondary | ICD-10-CM | POA: Diagnosis not present

## 2019-08-09 DIAGNOSIS — I2510 Atherosclerotic heart disease of native coronary artery without angina pectoris: Secondary | ICD-10-CM

## 2019-08-09 NOTE — Telephone Encounter (Signed)
Called patient to go over discharge instructions. No answer. He is only 6 month follow up will mail to him.

## 2019-08-09 NOTE — Patient Instructions (Signed)
Medication Instructions:  Your physician recommends that you continue on your current medications as directed. Please refer to the Current Medication list given to you today.  *If you need a refill on your cardiac medications before your next appointment, please call your pharmacy*  Lab Work: None.  If you have labs (blood work) drawn today and your tests are completely normal, you will receive your results only by: . MyChart Message (if you have MyChart) OR . A paper copy in the mail If you have any lab test that is abnormal or we need to change your treatment, we will call you to review the results.  Testing/Procedures: None.   Follow-Up: At [facility], you and your health needs are our priority.  As part of our continuing mission to provide you with exceptional heart care, we have created designated Provider Care Teams.  These Care Teams include your primary Cardiologist (physician) and Advanced Practice Providers (APPs -  Physician Assistants and Nurse Practitioners) who all work together to provide you with the care you need, when you need it.  Your next appointment:   6 month(s)  The format for your next appointment:   In Person  Provider:   Robert Krasowski, MD  Other Instructions   

## 2019-08-09 NOTE — Progress Notes (Signed)
Virtual Visit via Video Note   This visit type was conducted due to national recommendations for restrictions regarding the COVID-19 Pandemic (e.g. social distancing) in an effort to limit this patient's exposure and mitigate transmission in our community.  Due to his co-morbid illnesses, this patient is at least at moderate risk for complications without adequate follow up.  This format is felt to be most appropriate for this patient at this time.  All issues noted in this document were discussed and addressed.  A limited physical exam was performed with this format.  Please refer to the patient's chart for his consent to telehealth for [facility].  Evaluation Performed:  Follow-up visit  This visit type was conducted due to national recommendations for restrictions regarding the COVID-19 Pandemic (e.g. social distancing).  This format is felt to be most appropriate for this patient at this time.  All issues noted in this document were discussed and addressed.  No physical exam was performed (except for noted visual exam findings with Video Visits).  Please refer to the patient's chart (MyChart message for video visits and phone note for telephone visits) for the patient's consent to telehealth for [facility].  Date:  08/09/2019  ID: [name], DOB 1950-12-18, MRN [id number]   Patient Location: [place] [address]   Provider location:   [place] Office  PCP:  [name] Alar, NP  Cardiologist:  [name] Campus, MD     Chief Complaint: Doing well  History of Present Illness:    [name] is a 68 y.o. male  who presents via audio/video conferencing for a telehealth visit today.  Past medical history significant for aortic valve replacement is a 23 mm Edwards magna ease valve and that is in aortic position procedure time 03/04/2018.  He does have a televisit with me today.  Overall doing well.  Denies have any chest pain  tightness squeezing pressure burning chest overall he is doing strong.  He is concerned lately about his blood pressure being elevated but lately his blood pressure been normal.  I asked him to keep monitor his blood pressure.  His hemoglobin A1c is 7.5 and I told him he need to do a little bit better with his diabetes.  Overall he is feeling good and doing well.   The patient does not have symptoms concerning for COVID-19 infection (fever, chills, cough, or new SHORTNESS OF BREATH).    Prior CV studies:   The following studies were reviewed today:  Echocardiogram done in September 2020 showed:  . Stage 1: 1: Entire anterior septum is abnormal.  2. The left ventricle has mildly reduced systolic function, with an ejection fraction of 45-50%. The cavity size was normal. Left ventricular diastolic Doppler parameters are consistent with impaired relaxation. There is abnormal septal motion  consistent with post-operative status.  3. The right ventricle has normal systolic function. The cavity was normal. There is no increase in right ventricular wall thickness.  4. No evidence of mitral valve stenosis.  5. A 64mm an [employer] valve is present in the aortic position. Procedure Date: 03/04/2018 Normal aortic valve prosthesis.Normal function by doppler P/M 21/11 mm Hg and no AR.Marland Kitchen  6. The aortic root and ascending aorta are normal in size and structure.    Past Medical History:  Diagnosis Date  . Abnormal myocardial perfusion study 2006   EF 44% ? inferoseptal ischemia. no cath done  . Breast mass, right 03/2008  .  Contact dermatitis 01/31/2013  . COPD (chronic obstructive pulmonary disease) ([place])    "dx'd 12/2017"  . Coronary artery disease   . Heart murmur   . History of colonic polyps   . Hyperlipidemia   . Hypertension   . Low back pain   . Type II diabetes mellitus ([place])     Past Surgical History:  Procedure Laterality Date  . ABDOMINAL AORTOGRAM N/A 01/13/2018   Procedure:  ABDOMINAL AORTOGRAM;  Surgeon: Troy Sine, MD;  Location: [place] [facility];  Service: Cardiovascular;  Laterality: N/A;  . AORTIC VALVE REPLACEMENT N/A 03/04/2018   Procedure: AORTIC VALVE REPLACEMENT (AVR) 32mm Edwards Magna Ease Tissue Valve.;  Surgeon: Melrose Nakayama, MD;  Location: [place];  Service: Open Heart Surgery;  Laterality: N/A;  . CARDIAC VALVE REPLACEMENT    . COLONOSCOPY W/ POLYPECTOMY    . CORONARY ARTERY BYPASS GRAFT N/A 03/04/2018   Procedure: CORONARY ARTERY BYPASS GRAFTING (CABG) x1:  LIMA to LAD.;  Surgeon: Melrose Nakayama, MD;  Location: [place];  Service: Open Heart Surgery;  Laterality: N/A;  . LIPOMA EXCISION Left 07/2008    "back of shoulder" Dr Gershon Crane  . RIGHT/LEFT HEART CATH AND CORONARY ANGIOGRAPHY N/A 01/13/2018   Procedure: RIGHT/LEFT HEART CATH AND CORONARY ANGIOGRAPHY;  Surgeon: Troy Sine, MD;  Location: [place] [facility];  Service: Cardiovascular;  Laterality: N/A;  . TEE WITHOUT CARDIOVERSION N/A 03/04/2018   Procedure: TRANSESOPHAGEAL ECHOCARDIOGRAM (TEE);  Surgeon: Melrose Nakayama, MD;  Location: [place];  Service: Open Heart Surgery;  Laterality: N/A;  . TONSILLECTOMY    . WISDOM TOOTH EXTRACTION       Current Meds  Medication Sig  . aspirin EC 325 MG EC tablet Take 1 tablet (325 mg total) by mouth daily.  . Cyanocobalamin (VITAMIN B-12 PO) Take 1 tablet by mouth daily.  . dapagliflozin propanediol (FARXIGA) 5 MG TABS tablet Take 5 mg by mouth daily.  . furosemide (LASIX) 20 MG tablet TAKE 1 TABLET BY MOUTH EVERY DAY  . glucose blood (PRODIGY NO CODING BLOOD GLUC) test strip Use as instructed to check blood sugar twice a day.  DX  E11.40  . hydrALAZINE (APRESOLINE) 25 MG tablet Take 1 tablet (25 mg total) by mouth 3 (three) times daily.  . insulin aspart (NOVOLOG FLEXPEN) 100 UNIT/ML FlexPen INJECT 11-15 SUBCUTANEOUSLY DAILY BEFORE SUPPER  . Insulin Detemir (LEVEMIR FLEXTOUCH) 100 UNIT/ML Pen INJECT 60 UNITS INTO THE SKIN DAILY  AT 10 PM. (Patient taking differently: 50 Units. INJECT 60 UNITS INTO THE SKIN DAILY AT 10 PM.)  . Insulin Pen Needle (B-D ULTRAFINE III SHORT PEN) 31G X 8 MM MISC USE AS DIRECTED  . metFORMIN (GLUCOPHAGE) 1000 MG tablet Take 1 tablet (1,000 mg total) by mouth 2 (two) times daily with a meal. (Patient taking differently: Take 1,000 mg by mouth daily with breakfast. )  . metoprolol tartrate (LOPRESSOR) 50 MG tablet TAKE 1 TABLET BY MOUTH TWICE A DAY (NEEDS APPT)  . rosuvastatin (CRESTOR) 20 MG tablet TAKE 1 TABLET BY MOUTH EVERY DAY  . tadalafil (CIALIS) 5 MG tablet Take 5 mg by mouth daily as needed for erectile dysfunction.      Family History: The patient's family history includes Alzheimer's disease in his father; Diabetes in his father; Hypertension in his mother; Melanoma (age of onset: 18) in his father. There is no history of Colon cancer.   ROS:   Please see the history of present illness.  All other systems reviewed and are negative.   Labs/Other Tests and Data Reviewed:     Recent Labs: 06/22/2019: ALT 10; BUN 12; Creatinine, Ser 0.72; Potassium 4.6; Sodium 140  Recent Lipid Panel    Component Value Date/Time   CHOL 124 06/22/2019 0903   TRIG 72.0 06/22/2019 0903   HDL 43.60 06/22/2019 0903   CHOLHDL 3 06/22/2019 0903   VLDL 14.4 06/22/2019 0903   LDLCALC 66 06/22/2019 0903   LDLDIRECT 177.6 10/07/2007 1001      Exam:    Vital Signs:  BP 131/80   Pulse 72   Wt 186 lb (84.4 kg)   BMI 22.64 kg/m     Wt Readings from Last 3 Encounters:  08/09/19 186 lb (84.4 kg)  07/05/19 178 lb 12.8 oz (81.1 kg)  06/22/19 179 lb (81.2 kg)     Well nourished, well developed in no acute distress. Alert awake in exam 3 not in distress talking to me via video link from his home I am in our office in [place].  Diagnosis for this visit:   1. Essential hypertension   2. Coronary artery disease involving native coronary artery of native heart without angina pectoris   3.  Dilated cardiomyopathy ([place])   4. S/P AVR      ASSESSMENT & PLAN:    1.  Essential hypertension blood pressure appears to be well controlled right now we will continue present management. Coronary artery disease status post LIMA to LAD during the time of his aortic valve replacement.  Doing well denies have any chest pain tightness squeezing pressure burning chest 3.  Dilated cardiomyopathy.  His ejection fraction is 45 to 50% as before.  Abnormal septal motion which is most likely related to his prior surgery.  He is on beta-blocker, unable to tolerate ACE inhibitor because of kidney dysfunction.  He is already on vasodilatation which I will continue. 4.  Status post aortic valve replacement mean gradient is 11 mmHg, there is no aortic insufficiency.  Valve seems to be working properly.  We will continue present monitoring  COVID-19 Education: The signs and symptoms of COVID-19 were discussed with the patient and how to seek care for testing (follow up with PCP or arrange E-visit).  The importance of social distancing was discussed today.  Patient Risk:   After full review of this patients clinical status, I feel that they are at least moderate risk at this time.  Time:   Today, I have spent 5 minutes with the patient with telehealth technology discussing pt health issues.  I spent 20 minutes reviewing her chart before the visit.  Visit was finished at 10:25 AM.    Medication Adjustments/Labs and Tests Ordered: Current medicines are reviewed at length with the patient today.  Concerns regarding medicines are outlined above.  No orders of the defined types were placed in this encounter.  Medication changes: No orders of the defined types were placed in this encounter.    Disposition: Follow-up 6 months  Signed, Park Liter, MD, [facility] 08/09/2019 10:23 AM    [place]

## 2019-08-10 ENCOUNTER — Other Ambulatory Visit: Payer: Self-pay | Admitting: Family

## 2019-08-10 ENCOUNTER — Other Ambulatory Visit: Payer: Self-pay | Admitting: Cardiology

## 2019-08-25 ENCOUNTER — Other Ambulatory Visit: Payer: Self-pay | Admitting: Family

## 2019-09-09 ENCOUNTER — Other Ambulatory Visit: Payer: Self-pay | Admitting: Family

## 2019-09-13 DIAGNOSIS — H5203 Hypermetropia, bilateral: Secondary | ICD-10-CM | POA: Diagnosis not present

## 2019-09-13 DIAGNOSIS — E113291 Type 2 diabetes mellitus with mild nonproliferative diabetic retinopathy without macular edema, right eye: Secondary | ICD-10-CM | POA: Diagnosis not present

## 2019-09-13 LAB — HM DIABETES EYE EXAM

## 2019-09-20 ENCOUNTER — Other Ambulatory Visit: Payer: Self-pay | Admitting: Family

## 2019-09-27 ENCOUNTER — Other Ambulatory Visit: Payer: Self-pay | Admitting: Family

## 2019-10-01 ENCOUNTER — Ambulatory Visit (INDEPENDENT_AMBULATORY_CARE_PROVIDER_SITE_OTHER): Payer: Medicare Other | Admitting: Family

## 2019-10-01 ENCOUNTER — Encounter: Payer: Self-pay | Admitting: Family

## 2019-10-01 ENCOUNTER — Other Ambulatory Visit: Payer: Self-pay

## 2019-10-01 VITALS — BP 131/80 | HR 77 | Temp 96.8°F | Resp 16 | Ht 76.0 in | Wt 180.0 lb

## 2019-10-01 DIAGNOSIS — IMO0002 Reserved for concepts with insufficient information to code with codable children: Secondary | ICD-10-CM

## 2019-10-01 DIAGNOSIS — E1140 Type 2 diabetes mellitus with diabetic neuropathy, unspecified: Secondary | ICD-10-CM

## 2019-10-01 DIAGNOSIS — R0989 Other specified symptoms and signs involving the circulatory and respiratory systems: Secondary | ICD-10-CM

## 2019-10-01 DIAGNOSIS — N529 Male erectile dysfunction, unspecified: Secondary | ICD-10-CM | POA: Diagnosis not present

## 2019-10-01 DIAGNOSIS — E782 Mixed hyperlipidemia: Secondary | ICD-10-CM

## 2019-10-01 DIAGNOSIS — E1165 Type 2 diabetes mellitus with hyperglycemia: Secondary | ICD-10-CM

## 2019-10-01 DIAGNOSIS — I10 Essential (primary) hypertension: Secondary | ICD-10-CM

## 2019-10-01 LAB — BASIC METABOLIC PANEL
BUN: 14 mg/dL (ref 6–23)
CO2: 29 mEq/L (ref 19–32)
Calcium: 9.9 mg/dL (ref 8.4–10.5)
Chloride: 103 mEq/L (ref 96–112)
Creatinine, Ser: 0.93 mg/dL (ref 0.40–1.50)
GFR: 97.74 mL/min (ref >60.00)
Glucose, Bld: 98 mg/dL (ref 70–99)
Potassium: 5.1 mEq/L (ref 3.5–5.1)
Sodium: 142 mEq/L (ref 135–145)

## 2019-10-01 LAB — HEMOGLOBIN A1C: Hgb A1c MFr Bld: 7.5 % — ABNORMAL HIGH (ref 4.6–6.5)

## 2019-10-01 MED ORDER — LEVEMIR FLEXTOUCH 100 UNIT/ML ~~LOC~~ SOPN: 50.0000 [IU] | PEN_INJECTOR | Freq: Every day | SUBCUTANEOUS | 0 refills | Status: DC

## 2019-10-01 MED ORDER — LEVEMIR FLEXTOUCH 100 UNIT/ML ~~LOC~~ SOPN: 45.0000 [IU] | PEN_INJECTOR | Freq: Every day | SUBCUTANEOUS | 0 refills | Status: DC

## 2019-10-01 MED ORDER — TADALAFIL 20 MG PO TABS: 20.0000 mg | ORAL_TABLET | Freq: Every day | ORAL | 5 refills | Status: DC | PRN

## 2019-10-01 NOTE — Patient Instructions (Signed)
Below are two ways to schedule a Covid-19 Vaccine:  Please visit [email or web address] to register or call [phone]  Or call:  [place] Covid-19 vaccine scheduling at [phone]  

## 2019-10-01 NOTE — Progress Notes (Signed)
Subjective:    Patient ID: [name], male    DOB: 28-Jan-1951, 69 y.o.   MRN: [id number]  HPI  Patient is a 69 yr old male who presents today for follow up.  HTN- maintained on metoprolol and hydralazine.  BP Readings from Last 3 Encounters:  10/01/19 131/80  08/09/19 131/80  07/05/19 126/82   DM2- reports that his home readings 107-120 (fasting).  Maintained on Levemir 45 units daily, and NovoLog before dinner. He is also maintained on Farxiga and Metformin. Lab Results  Component Value Date   HGBA1C 7.5 (H) 06/22/2019   HGBA1C 7.8 (H) 09/08/2018   HGBA1C 7.3 (H) 06/10/2018   Lab Results  Component Value Date   MICROALBUR <0.7 09/08/2018   LDLCALC 66 06/22/2019   CREATININE 0.72 06/22/2019   Hyperlipidemia- denies myalgia, notes occasional leg cramping in the evening.   Lab Results  Component Value Date   CHOL 124 06/22/2019   HDL 43.60 06/22/2019   LDLCALC 66 06/22/2019   LDLDIRECT 177.6 10/07/2007   TRIG 72.0 06/22/2019   CHOLHDL 3 06/22/2019   ED- continues cialis.  He reports minimal response with a 5 mg dose of Cialis.   Review of Systems   See HPI  Past Medical History:  Diagnosis Date  . Abnormal myocardial perfusion study 2006   EF 44% ? inferoseptal ischemia. no cath done  . Breast mass, right 03/2008  . Contact dermatitis 01/31/2013  . COPD (chronic obstructive pulmonary disease) ([place])    "dx'd 12/2017"  . Coronary artery disease   . Heart murmur   . History of colonic polyps   . Hyperlipidemia   . Hypertension   . Low back pain   . Type II diabetes mellitus ([place])      Social History   Socioeconomic History  . Marital status: Married    Spouse name: Not on file  . Number of children: Not on file  . Years of education: Not on file  . Highest education level: Not on file  Occupational History  . Not on file  Tobacco Use  . Smoking status: Former Smoker    Packs/day: 1.50    Years: 41.00    Pack years: 61.50    Types:  Cigarettes    Quit date: 08/26/2010    Years since quitting: 9.1  . Smokeless tobacco: Never Used  . Tobacco comment: Quit 2012  Substance and Sexual Activity  . Alcohol use: Yes    Alcohol/week: 3.0 standard drinks    Types: 3 Glasses of wine per week  . Drug use: Not Currently  . Sexual activity: Not Currently  Other Topics Concern  . 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 Difficulty of Paying Living Expenses: Not on file  Food Insecurity:   . Worried About Charity fundraiser in the Last Year: Not on file  . Ran Out of Food in the Last Year: Not on file  Transportation Needs:   . Lack of Transportation (Medical): Not on file  . Lack of Transportation (Non-Medical): Not on file  Physical Activity:   . Days of Exercise per Week: Not on file  . Minutes of Exercise per  Session: Not on file  Stress:   . Feeling of Stress : Not on file  Social Connections:   . Frequency of Communication with Friends and Family: Not on file  . Frequency of Social Gatherings with Friends and Family: Not on file  . Attends Religious Services: Not on file  . Active Member of Clubs or Organizations: Not on file  . Attends Archivist Meetings: Not on file  . Marital Status: Not on file  Intimate Partner Violence:   . Fear of Current or Ex-Partner: Not on file  . Emotionally Abused: Not on file  . Physically Abused: Not on file  . Sexually Abused: Not on file    Past Surgical History:  Procedure Laterality Date  . ABDOMINAL AORTOGRAM N/A 01/13/2018   Procedure: ABDOMINAL AORTOGRAM;  Surgeon: Troy Sine, MD;  Location: [place] [facility];  Service: Cardiovascular;  Laterality: N/A;  . AORTIC VALVE REPLACEMENT N/A 03/04/2018   Procedure: AORTIC VALVE REPLACEMENT (AVR) 63mm Edwards Magna  Ease Tissue Valve.;  Surgeon: Melrose Nakayama, MD;  Location: [place];  Service: Open Heart Surgery;  Laterality: N/A;  . CARDIAC VALVE REPLACEMENT    . COLONOSCOPY W/ POLYPECTOMY    . CORONARY ARTERY BYPASS GRAFT N/A 03/04/2018   Procedure: CORONARY ARTERY BYPASS GRAFTING (CABG) x1:  LIMA to LAD.;  Surgeon: Melrose Nakayama, MD;  Location: [place];  Service: Open Heart Surgery;  Laterality: N/A;  . LIPOMA EXCISION Left 07/2008    "back of shoulder" Dr Gershon Crane  . RIGHT/LEFT HEART CATH AND CORONARY ANGIOGRAPHY N/A 01/13/2018   Procedure: RIGHT/LEFT HEART CATH AND CORONARY ANGIOGRAPHY;  Surgeon: Troy Sine, MD;  Location: [place] [facility];  Service: Cardiovascular;  Laterality: N/A;  . TEE WITHOUT CARDIOVERSION N/A 03/04/2018   Procedure: TRANSESOPHAGEAL ECHOCARDIOGRAM (TEE);  Surgeon: Melrose Nakayama, MD;  Location: [place];  Service: Open Heart Surgery;  Laterality: N/A;  . TONSILLECTOMY    . WISDOM TOOTH EXTRACTION      Family History  Problem Relation Age of Onset  . Melanoma Father 22       deceased secondary to melanoma  . Alzheimer's disease Father   . Diabetes Father   . Hypertension Mother        alive -38  . Colon cancer Neg Hx     Allergies  Allergen Reactions  . Amlodipine Besylate Other (See Comments)    REACTION: ? caused left axillary nodules/chest flutter  . Hydrochlorothiazide Hives  . Lisinopril     Hyperkalemia     Current Outpatient Medications on File Prior to Visit  Medication Sig Dispense Refill  . aspirin EC 325 MG EC tablet Take 1 tablet (325 mg total) by mouth daily.  0  . Cyanocobalamin (VITAMIN B-12 PO) Take 1 tablet by mouth daily.    Marland Kitchen FARXIGA 5 MG TABS tablet TAKE 1 TABLET BY MOUTH EVERY DAY 30 tablet 5  . furosemide (LASIX) 20 MG tablet TAKE 1 TABLET BY MOUTH EVERY DAY 90 tablet 1  . glucose blood (PRODIGY NO CODING BLOOD GLUC) test strip Use as instructed to check blood sugar twice a day.  DX  E11.40 100 each 1  . hydrALAZINE  (APRESOLINE) 25 MG tablet Take 1 tablet (25 mg total) by mouth 3 (three) times daily. 90 tablet 5  . insulin aspart (NOVOLOG FLEXPEN) 100 UNIT/ML FlexPen INJECT 11-15 SUBCUTANEOUSLY DAILY BEFORE SUPPER 15 mL 0  . Insulin Pen Needle (B-D ULTRAFINE III SHORT PEN) 31G X  8 MM MISC USE AS DIRECTED 100 each 5  . metFORMIN (GLUCOPHAGE) 1000 MG tablet TAKE 1 TABLET (1,000 MG TOTAL) BY MOUTH 2 (TWO) TIMES DAILY WITH A MEAL. 180 tablet 1  . metoprolol tartrate (LOPRESSOR) 50 MG tablet TAKE 1 TABLET BY MOUTH TWICE A DAY (NEEDS APPT) 180 tablet 0  . rosuvastatin (CRESTOR) 20 MG tablet TAKE 1 TABLET BY MOUTH EVERY DAY 90 tablet 1   No current facility-administered medications on file prior to visit.    BP 131/80 (BP Location: Right Arm, Patient Position: Sitting, Cuff Size: Small)   Pulse 77   Temp (!) 96.8 F (36 C) (Temporal)   Resp 16   Ht 6\' 4"  (1.93 m)   Wt 180 lb (81.6 kg)   SpO2 99%   BMI 21.91 kg/m        Objective:   Physical Exam Constitutional:      General: He is not in acute distress.    Appearance: He is well-developed.  HENT:     Head: Normocephalic and atraumatic.  Cardiovascular:     Rate and Rhythm: Normal rate and regular rhythm.     Pulses:          Dorsalis pedis pulses are 1+ on the right side and 1+ on the left side.       Posterior tibial pulses are 1+ on the right side and 1+ on the left side.     Heart sounds: No murmur.  Pulmonary:     Effort: Pulmonary effort is normal. No respiratory distress.     Breath sounds: Normal breath sounds. No wheezing or rales.  Skin:    General: Skin is warm and dry.  Neurological:     Mental Status: He is alert and oriented to person, place, and time.  Psychiatric:        Behavior: Behavior normal.        Thought Content: Thought content normal.           Assessment & Plan:  Diminished pedal pulses-will obtain lower extremity ABI to assess blood flow.  Diabetes type 2-clinically stable on current regimen.  Will  obtain follow-up A1c.  Hypertension-blood pressure stable on current regimen.  Continue same.  Hyperlipidemia-LDL at goal, continue Crestor.  Erectile dysfunction-uncontrolled on current dose of Cialis.  Will increase Cialis to 20 mg.  I have advised the patient to begin with a half tab.  If no response to 10 mg may increase to the full 20 mg tab.  This visit occurred during the SARS-CoV-2 public health emergency.  Safety protocols were in place, including screening questions prior to the visit, additional usage of staff PPE, and extensive cleaning of exam room while observing appropriate contact time as indicated for disinfecting solutions.

## 2019-10-08 ENCOUNTER — Ambulatory Visit (HOSPITAL_COMMUNITY)
Admission: RE | Admit: 2019-10-08 | Discharge: 2019-10-08 | Disposition: A | Discharge status: Home / Self Care | Payer: Medicare Other | Source: Ambulatory Visit | Attending: Family | Admitting: Family

## 2019-10-08 ENCOUNTER — Other Ambulatory Visit: Payer: Self-pay

## 2019-10-08 DIAGNOSIS — R0989 Other specified symptoms and signs involving the circulatory and respiratory systems: Secondary | ICD-10-CM | POA: Diagnosis not present

## 2019-10-28 ENCOUNTER — Other Ambulatory Visit: Payer: Self-pay | Admitting: Family

## 2019-11-08 ENCOUNTER — Other Ambulatory Visit: Payer: Self-pay | Admitting: Family

## 2019-11-25 ENCOUNTER — Telehealth: Payer: Self-pay | Admitting: Family

## 2019-11-25 NOTE — Progress Notes (Signed)
  Chronic Care Management   Outreach Note  11/25/2019 Name: [name] MRN: [id number] DOB: 06/30/1951  Referred by: Debbrah Alar, NP Reason for referral : No chief complaint on file.   An unsuccessful telephone outreach was attempted today. The patient was referred to the pharmacist for assistance with care management and care coordination.   Follow Up Plan:   Raynicia Dukes [employer] Scheduler

## 2019-12-07 ENCOUNTER — Telehealth: Payer: Self-pay

## 2019-12-07 NOTE — Telephone Encounter (Signed)
Patient called in concerned because he received an coloscopy notice in the mail to have another one done before his time to take another one. Patient would like to discuss these concerns with the nurse or NP Inda Castle. Please call the patients wife at [phone] as soon as possible thanks,

## 2019-12-08 ENCOUNTER — Telehealth: Payer: Self-pay | Admitting: Family

## 2019-12-08 NOTE — Telephone Encounter (Signed)
Patient's wife advised on his last colonoscopy they found a polyp and this was removed. The reports says they will schedule a follow up colonoscopy according to findings on path report.  She will call them to set up.

## 2019-12-08 NOTE — Progress Notes (Signed)
  Chronic Care Management   Outreach Note  12/08/2019 Name: [name] MRN: [id number] DOB: 1951-06-14  Referred by: Debbrah Alar, NP Reason for referral : No chief complaint on file.   A second unsuccessful telephone outreach was attempted today. The patient was referred to pharmacist for assistance with care management and care coordination.  Follow Up Plan:   Raynicia Dukes [employer] Scheduler

## 2019-12-09 ENCOUNTER — Encounter: Payer: Self-pay | Admitting: Gastroenterology

## 2019-12-21 ENCOUNTER — Other Ambulatory Visit: Payer: Self-pay | Admitting: Family

## 2019-12-31 ENCOUNTER — Encounter: Payer: Self-pay | Admitting: Family

## 2019-12-31 ENCOUNTER — Other Ambulatory Visit: Payer: Self-pay

## 2019-12-31 ENCOUNTER — Ambulatory Visit (INDEPENDENT_AMBULATORY_CARE_PROVIDER_SITE_OTHER): Payer: Medicare Other | Admitting: Family

## 2019-12-31 VITALS — BP 144/84 | HR 75 | Temp 97.4°F | Resp 16 | Ht 76.0 in | Wt 172.0 lb

## 2019-12-31 DIAGNOSIS — E785 Hyperlipidemia, unspecified: Secondary | ICD-10-CM | POA: Diagnosis not present

## 2019-12-31 DIAGNOSIS — E1165 Type 2 diabetes mellitus with hyperglycemia: Secondary | ICD-10-CM

## 2019-12-31 DIAGNOSIS — N529 Male erectile dysfunction, unspecified: Secondary | ICD-10-CM

## 2019-12-31 DIAGNOSIS — E1140 Type 2 diabetes mellitus with diabetic neuropathy, unspecified: Secondary | ICD-10-CM | POA: Diagnosis not present

## 2019-12-31 DIAGNOSIS — J449 Chronic obstructive pulmonary disease, unspecified: Secondary | ICD-10-CM | POA: Diagnosis not present

## 2019-12-31 DIAGNOSIS — IMO0002 Reserved for concepts with insufficient information to code with codable children: Secondary | ICD-10-CM

## 2019-12-31 MED ORDER — FARXIGA 5 MG PO TABS: 5.0000 mg | ORAL_TABLET | Freq: Every day | ORAL | 5 refills | Status: DC

## 2019-12-31 MED ORDER — LEVEMIR FLEXTOUCH 100 UNIT/ML ~~LOC~~ SOPN: PEN_INJECTOR | SUBCUTANEOUS | 3 refills | Status: DC

## 2019-12-31 MED ORDER — NOVOLOG FLEXPEN 100 UNIT/ML ~~LOC~~ SOPN: PEN_INJECTOR | SUBCUTANEOUS | 0 refills | Status: DC

## 2019-12-31 MED ORDER — METFORMIN HCL 1000 MG PO TABS: 1000.0000 mg | ORAL_TABLET | Freq: Two times a day (BID) | ORAL | 1 refills | Status: DC

## 2019-12-31 NOTE — Progress Notes (Signed)
Subjective:    Patient ID: [name], male    DOB: 04/30/1951, 69 y.o.   MRN: [id number]  HPI  Patient is a 69 yr old male who presents today for follow up.  HTN- maintained on metoprolol, hydralazine.  BP Readings from Last 3 Encounters:  12/31/19 (!) 144/84  10/01/19 131/80  08/09/19 131/80   Hyperlipidemia- maintained on crestor.  Lab Results  Component Value Date   CHOL 124 06/22/2019   HDL 43.60 06/22/2019   LDLCALC 66 06/22/2019   LDLDIRECT 177.6 10/07/2007   TRIG 72.0 06/22/2019   CHOLHDL 3 06/22/2019   ED- maintained on cialis.   DM2- reports fasting sugars in the 90's.  Reports that he has been taking 11-15 units of Novolog AC dinner.  Lab Results  Component Value Date   HGBA1C 7.5 (H) 10/01/2019   HGBA1C 7.5 (H) 06/22/2019   HGBA1C 7.8 (H) 09/08/2018   Lab Results  Component Value Date   MICROALBUR <0.7 09/08/2018   LDLCALC 66 06/22/2019   CREATININE 0.93 10/01/2019   Review of Systems See HPI  Past Medical History:  Diagnosis Date  . Abnormal myocardial perfusion study 2006   EF 44% ? inferoseptal ischemia. no cath done  . Breast mass, right 03/2008  . Contact dermatitis 01/31/2013  . COPD (chronic obstructive pulmonary disease) ([place])    "dx'd 12/2017"  . Coronary artery disease   . Heart murmur   . History of colonic polyps   . Hyperlipidemia   . Hypertension   . Low back pain   . Type II diabetes mellitus ([place])      Social History   Socioeconomic History  . Marital status: Married    Spouse name: Not on file  . Number of children: Not on file  . Years of education: Not on file  . Highest education level: Not on file  Occupational History  . Not on file  Tobacco Use  . Smoking status: Former Smoker    Packs/day: 1.50    Years: 41.00    Pack years: 61.50    Types: Cigarettes    Quit date: 08/26/2010    Years since quitting: 9.3  . Smokeless tobacco: Never Used  . Tobacco comment: Quit 2012  Substance and Sexual Activity   . Alcohol use: Yes    Alcohol/week: 3.0 standard drinks    Types: 3 Glasses of wine per week  . Drug use: Not Currently  . Sexual activity: Not Currently  Other Topics Concern  . 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 Difficulty of Paying Living Expenses:   Food Insecurity:   . Worried About Charity fundraiser in the Last Year:   . Arboriculturist in the Last Year:   Transportation Needs:   . Film/video editor (Medical):   Marland Kitchen Lack of Transportation (Non-Medical):   Physical Activity:   . Days of Exercise per Week:   . Minutes of Exercise per Session:   Stress:   . Feeling of Stress :   Social Connections:   . Frequency of Communication with Friends and Family:   . Frequency of Social Gatherings with Friends and Family:   .  Attends Religious Services:   . Active Member of Clubs or Organizations:   . Attends Archivist Meetings:   Marland Kitchen Marital Status:   Intimate Partner Violence:   . Fear of Current or Ex-Partner:   . Emotionally Abused:   Marland Kitchen Physically Abused:   . Sexually Abused:     Past Surgical History:  Procedure Laterality Date  . ABDOMINAL AORTOGRAM N/A 01/13/2018   Procedure: ABDOMINAL AORTOGRAM;  Surgeon: Troy Sine, MD;  Location: [place] [facility];  Service: Cardiovascular;  Laterality: N/A;  . AORTIC VALVE REPLACEMENT N/A 03/04/2018   Procedure: AORTIC VALVE REPLACEMENT (AVR) 20mm Edwards Magna Ease Tissue Valve.;  Surgeon: Melrose Nakayama, MD;  Location: [place];  Service: Open Heart Surgery;  Laterality: N/A;  . CARDIAC VALVE REPLACEMENT    . COLONOSCOPY W/ POLYPECTOMY    . CORONARY ARTERY BYPASS GRAFT N/A 03/04/2018   Procedure: CORONARY ARTERY BYPASS GRAFTING (CABG) x1:  LIMA to LAD.;  Surgeon: Melrose Nakayama, MD;  Location: [place];   Service: Open Heart Surgery;  Laterality: N/A;  . LIPOMA EXCISION Left 07/2008    "back of shoulder" Dr Gershon Crane  . RIGHT/LEFT HEART CATH AND CORONARY ANGIOGRAPHY N/A 01/13/2018   Procedure: RIGHT/LEFT HEART CATH AND CORONARY ANGIOGRAPHY;  Surgeon: Troy Sine, MD;  Location: [place] [facility];  Service: Cardiovascular;  Laterality: N/A;  . TEE WITHOUT CARDIOVERSION N/A 03/04/2018   Procedure: TRANSESOPHAGEAL ECHOCARDIOGRAM (TEE);  Surgeon: Melrose Nakayama, MD;  Location: [place];  Service: Open Heart Surgery;  Laterality: N/A;  . TONSILLECTOMY    . WISDOM TOOTH EXTRACTION      Family History  Problem Relation Age of Onset  . Melanoma Father 69       deceased secondary to melanoma  . Alzheimer's disease Father   . Diabetes Father   . Hypertension Mother        alive -32  . Colon cancer Neg Hx     Allergies  Allergen Reactions  . Amlodipine Besylate Other (See Comments)    REACTION: ? caused left axillary nodules/chest flutter  . Hydrochlorothiazide Hives  . Lisinopril     Hyperkalemia     Current Outpatient Medications on File Prior to Visit  Medication Sig Dispense Refill  . aspirin EC 325 MG EC tablet Take 1 tablet (325 mg total) by mouth daily.  0  . Cyanocobalamin (VITAMIN B-12 PO) Take 1 tablet by mouth daily.    Marland Kitchen FARXIGA 5 MG TABS tablet TAKE 1 TABLET BY MOUTH EVERY DAY 30 tablet 5  . furosemide (LASIX) 20 MG tablet TAKE 1 TABLET BY MOUTH EVERY DAY 90 tablet 1  . glucose blood (PRODIGY NO CODING BLOOD GLUC) test strip Use as instructed to check blood sugar twice a day.  DX  E11.40 100 each 1  . hydrALAZINE (APRESOLINE) 25 MG tablet TAKE 1 TABLET BY MOUTH THREE TIMES A DAY 270 tablet 1  . insulin aspart (NOVOLOG FLEXPEN) 100 UNIT/ML FlexPen INJECT 11-15 SUBCUTANEOUSLY DAILY BEFORE SUPPER 15 mL 0  . Insulin Pen Needle (B-D ULTRAFINE III SHORT PEN) 31G X 8 MM MISC USE AS DIRECTED 100 each 5  . LEVEMIR FLEXTOUCH 100 UNIT/ML FlexPen INJECT 60 UNITS INTO THE SKIN DAILY  AT 10 PM. 15 mL 3  . metFORMIN (GLUCOPHAGE) 1000 MG tablet TAKE 1 TABLET (1,000 MG TOTAL) BY MOUTH 2 (TWO) TIMES DAILY WITH A MEAL. 180 tablet 1  . metoprolol tartrate (LOPRESSOR) 50 MG tablet TAKE 1 TABLET BY  MOUTH TWICE A DAY (NEEDS APPT) 180 tablet 1  . rosuvastatin (CRESTOR) 20 MG tablet TAKE 1 TABLET BY MOUTH EVERY DAY 90 tablet 1  . tadalafil (CIALIS) 20 MG tablet Take 1 tablet (20 mg total) by mouth daily as needed for erectile dysfunction. 10 tablet 5   No current facility-administered medications on file prior to visit.    BP (!) 144/84 (BP Location: Right Arm, Patient Position: Sitting, Cuff Size: Small)   Pulse 75   Temp (!) 97.4 F (36.3 C) (Temporal)   Resp 16   Ht 6\' 4"  (1.93 m)   Wt 172 lb (78 kg)   SpO2 99%   BMI 20.94 kg/m       Objective:   Physical Exam Constitutional:      General: He is not in acute distress.    Appearance: He is well-developed.  HENT:     Head: Normocephalic and atraumatic.  Cardiovascular:     Rate and Rhythm: Normal rate and regular rhythm.     Heart sounds: No murmur.  Pulmonary:     Effort: Pulmonary effort is normal. No respiratory distress.     Breath sounds: Normal breath sounds. No wheezing or rales.  Skin:    General: Skin is warm and dry.  Neurological:     Mental Status: He is alert and oriented to person, place, and time.  Psychiatric:        Behavior: Behavior normal.        Thought Content: Thought content normal.           Assessment & Plan:  COPD- wears oxygen at night. His pulmonary provider left and he needs to establish with a new provider. Referral placed. Clinically stable.  Hyperlipidemia- LDL at goal. Continue crestor.   DM2- a1C is acceptable for his age. Continue current regimen, obtain follow up A1C.   ED- reports good response to cialis. Continue same.   This visit occurred during the SARS-CoV-2 public health emergency.  Safety protocols were in place, including screening questions prior to the  visit, additional usage of staff PPE, and extensive cleaning of exam room while observing appropriate contact time as indicated for disinfecting solutions.

## 2019-12-31 NOTE — Patient Instructions (Signed)
Please complete lab work prior to leaving.   

## 2020-01-03 ENCOUNTER — Other Ambulatory Visit: Payer: Self-pay

## 2020-01-03 ENCOUNTER — Telehealth: Payer: Self-pay | Admitting: *Deleted

## 2020-01-03 DIAGNOSIS — Z86010 Personal history of colonic polyps: Secondary | ICD-10-CM

## 2020-01-03 DIAGNOSIS — Z1211 Encounter for screening for malignant neoplasm of colon: Secondary | ICD-10-CM

## 2020-01-03 NOTE — Telephone Encounter (Signed)
OK for direct book to my [facility] block.  My next available block is in late June (office will have date on schedule)

## 2020-01-03 NOTE — Telephone Encounter (Signed)
Dr.Danis,  This patient is schedule at [facility] for recall colon. Last colon 2018, polyp recall 3 years. Per chart this patient is currently using Oxygen 2 liters at night for COPD. [place] for direct hospital colon or OV first? Please advise. Thank you, Omar Orrego pv

## 2020-01-03 NOTE — Telephone Encounter (Signed)
Pt scheduled at [facility] 02/22/20 at 11:15am. Covid screen scheduled at [facility] 02/22/20@11 :15am.

## 2020-01-03 NOTE — Telephone Encounter (Signed)
Patient called and notified that his procedure will be done at the hospital not [facility].

## 2020-01-03 NOTE — Telephone Encounter (Signed)
Patient called and made aware of new colon appointment. Also reschedule PV closer to that date. Pt is aware.

## 2020-01-03 NOTE — Telephone Encounter (Signed)
Patient confirmed that he is on Oxygen at night.

## 2020-01-05 ENCOUNTER — Telehealth: Payer: Self-pay | Admitting: Family

## 2020-01-05 NOTE — Telephone Encounter (Signed)
Done

## 2020-01-05 NOTE — Telephone Encounter (Signed)
Please contact pt to schedule lab appointment.

## 2020-01-06 ENCOUNTER — Other Ambulatory Visit (INDEPENDENT_AMBULATORY_CARE_PROVIDER_SITE_OTHER): Payer: Medicare Other

## 2020-01-06 ENCOUNTER — Other Ambulatory Visit: Payer: Self-pay

## 2020-01-06 DIAGNOSIS — E1165 Type 2 diabetes mellitus with hyperglycemia: Secondary | ICD-10-CM | POA: Diagnosis not present

## 2020-01-06 DIAGNOSIS — E1140 Type 2 diabetes mellitus with diabetic neuropathy, unspecified: Secondary | ICD-10-CM | POA: Diagnosis not present

## 2020-01-06 DIAGNOSIS — IMO0002 Reserved for concepts with insufficient information to code with codable children: Secondary | ICD-10-CM

## 2020-01-06 DIAGNOSIS — E785 Hyperlipidemia, unspecified: Secondary | ICD-10-CM

## 2020-01-06 LAB — BASIC METABOLIC PANEL
BUN: 12 mg/dL (ref 6–23)
CO2: 29 mEq/L (ref 19–32)
Calcium: 9.3 mg/dL (ref 8.4–10.5)
Chloride: 104 mEq/L (ref 96–112)
Creatinine, Ser: 0.76 mg/dL (ref 0.40–1.50)
GFR: 123.28 mL/min (ref >60.00)
Glucose, Bld: 95 mg/dL (ref 70–99)
Potassium: 4.3 mEq/L (ref 3.5–5.1)
Sodium: 141 mEq/L (ref 135–145)

## 2020-01-06 LAB — HEMOGLOBIN A1C: Hgb A1c MFr Bld: 7.4 % — ABNORMAL HIGH (ref 4.6–6.5)

## 2020-01-06 NOTE — Addendum Note (Signed)
Addended by: Caffie Pinto on: 01/06/2020 10:15 AM   Modules accepted: Orders

## 2020-01-06 NOTE — Addendum Note (Signed)
Addended by: Jiles Prows on: 01/06/2020 10:10 AM   Modules accepted: Orders

## 2020-01-06 NOTE — Addendum Note (Signed)
Addended by: Caffie Pinto on: 01/06/2020 10:13 AM   Modules accepted: Orders

## 2020-01-12 ENCOUNTER — Other Ambulatory Visit: Payer: Self-pay

## 2020-01-12 ENCOUNTER — Encounter: Payer: Self-pay | Admitting: Internal Medicine

## 2020-01-12 ENCOUNTER — Ambulatory Visit (INDEPENDENT_AMBULATORY_CARE_PROVIDER_SITE_OTHER): Payer: Medicare Other | Admitting: Internal Medicine

## 2020-01-12 VITALS — BP 130/80 | HR 80 | Temp 97.4°F | Ht 76.0 in | Wt 177.6 lb

## 2020-01-12 DIAGNOSIS — J410 Simple chronic bronchitis: Secondary | ICD-10-CM

## 2020-01-12 NOTE — Patient Instructions (Signed)
The patient should have follow up scheduled with myself in 6 months.   Prior to next visit patient should have: CT scan in August 2021

## 2020-01-12 NOTE — Progress Notes (Signed)
[name]    [id number]    [birth year]  Primary Care Physician:O'Sullivan, Lenna Sciara, NP Date of Appointment: 01/12/2020 Established Patient Visit  Chief complaint:   Chief Complaint  Patient presents with  . Consult    new pt, previously seen by [name].  COPD, noctural Hypoxia.     HPI: [name] is a 69 y.o. gentleman with history of COPD. Formerly followed by Dr. Lake Bells.  Here to establish care with me today. Retired from [employer]. Lives at home with his wife, pet Yorkie  Interval Updates: No hospitalizations or ED visits. No significant dyspnea with ADLs.  Has been getting annual CT scans for lung cancer screening. Last one august 2020.  Does not take any inhalers due to lack of benefit. Wears 2LNC nocturnally.  No chest pain, no wheezing.  Quit smoking 9 years ago with hypnosis seminar.   I have reviewed the patient's family social and past medical history and updated as appropriate.   Past Medical History:  Diagnosis Date  . Abnormal myocardial perfusion study 2006   EF 44% ? inferoseptal ischemia. no cath done  . Breast mass, right 03/2008  . Contact dermatitis 01/31/2013  . COPD (chronic obstructive pulmonary disease) ([place])    "dx'd 12/2017"  . Coronary artery disease   . Heart murmur   . History of colonic polyps   . Hyperlipidemia   . Hypertension   . Low back pain   . Type II diabetes mellitus ([place])     Past Surgical History:  Procedure Laterality Date  . ABDOMINAL AORTOGRAM N/A 01/13/2018   Procedure: ABDOMINAL AORTOGRAM;  Surgeon: [name] Sine, MD;  Location: [place] [facility];  Service: Cardiovascular;  Laterality: N/A;  . AORTIC VALVE REPLACEMENT N/A 03/04/2018   Procedure: AORTIC VALVE REPLACEMENT (AVR) 74mm Edwards Magna Ease Tissue Valve.;  Surgeon: [name] Nakayama, MD;  Location: [place];  Service: Open Heart Surgery;  Laterality: N/A;  . CARDIAC VALVE REPLACEMENT    . COLONOSCOPY W/ POLYPECTOMY    . CORONARY  ARTERY BYPASS GRAFT N/A 03/04/2018   Procedure: CORONARY ARTERY BYPASS GRAFTING (CABG) x1:  LIMA to LAD.;  Surgeon: [name] Nakayama, MD;  Location: [place];  Service: Open Heart Surgery;  Laterality: N/A;  . LIPOMA EXCISION Left 07/2008    "back of shoulder" Dr [name]  . RIGHT/LEFT HEART CATH AND CORONARY ANGIOGRAPHY N/A 01/13/2018   Procedure: RIGHT/LEFT HEART CATH AND CORONARY ANGIOGRAPHY;  Surgeon: [name] Sine, MD;  Location: [place] [facility];  Service: Cardiovascular;  Laterality: N/A;  . TEE WITHOUT CARDIOVERSION N/A 03/04/2018   Procedure: TRANSESOPHAGEAL ECHOCARDIOGRAM (TEE);  Surgeon: [name] Nakayama, MD;  Location: [place];  Service: Open Heart Surgery;  Laterality: N/A;  . TONSILLECTOMY    . WISDOM TOOTH EXTRACTION      Family History  Problem Relation Age of Onset  . Melanoma Father 35       deceased secondary to melanoma  . Alzheimer's disease Father   . Diabetes Father   . Hypertension Mother        alive -47  . Colon cancer Neg Hx     Social History   Occupational History  . Not on file  Tobacco Use  . Smoking status: Former Smoker    Packs/day: 1.50    Years: 41.00    Pack years: 61.50    Types: Cigarettes    Quit date: 08/26/2010    Years  since quitting: 9.3  . Smokeless tobacco: Never Used  . Tobacco comment: Quit 2012  Substance and Sexual Activity  . Alcohol use: Yes    Alcohol/week: 3.0 standard drinks    Types: 3 Glasses of wine per week  . Drug use: Not Currently  . Sexual activity: Not Currently     Physical Exam: Blood pressure 130/80, pulse 80, temperature (!) 97.4 F (36.3 C), temperature source Temporal, height 6\' 4"  (1.93 m), weight 177 lb 9.6 oz (80.6 kg), SpO2 93 %.  Gen:      No acute distress Lungs:    Diminished breath sounds bilaterallyNo increased respiratory effort, symmetric chest wall excursion, clear to auscultation bilaterally, no wheezes or crackles CV:         Regular rate and rhythm; no murmurs, rubs, or gallops.   No pedal edema   Data Reviewed: Imaging: I have personally reviewed the ct chest august 2020 which demonstrates severe centrilobular emphysema.   PFTs:  PFT Results Latest Ref Rng & Units 02/04/2018  FVC-Pre L 3.37  FVC-Predicted Pre % 71  FVC-Post L 3.88  FVC-Predicted Post % 82  Pre FEV1/FVC % % 53  Post FEV1/FCV % % 50  FEV1-Pre L 1.78  FEV1-Predicted Pre % 49  FEV1-Post L 1.96  DLCO UNC% % 36  DLCO COR %Predicted % 57  TLC L 7.36  TLC % Predicted % 91  RV % Predicted % 125   I have personally reviewed the patient's PFTs and they are consistent with severe airflow limitation.   Labs:  Immunization status: Immunization History  Administered Date(s) Administered  . Fluad Quad(high Dose 65+) 05/26/2019  . Influenza Split 08/04/2012  . Influenza Whole 05/20/2008, 07/12/2009, 05/02/2010  . Influenza, High Dose Seasonal PF 05/20/2017, 06/10/2018  . Influenza,inj,Quad PF,6+ Mos 05/07/2013, 05/09/2014, 06/07/2015, 05/01/2016  . PFIZER SARS-COV-2 Vaccination 11/16/2019, 12/14/2019  . Pneumococcal Conjugate-13 02/05/2017  . Pneumococcal Polysaccharide-23 05/20/2008, 08/25/2018  . Td 08/26/2008    Assessment:  Severe COPD with severe radiographic emphysema Chronic respiratory failure - on nocturnal 2LNC Need for LDCT for lung cancer screening  Plan/Recommendations: Continue home oxygen. Patient requires nocturnal 2LNC and will need to continue this.  Continue to monitor off inhaler therapy.  Plan for LDCT for lung cancer screening this august 2021. Will schedule today.   I spent 30 minutes on 01/12/2020 in care of this patient including face to face time and non-face to face time spent charting, review of outside records, and coordination of care.   Return to Care: Return in about 6 months (around 07/14/2020), or if symptoms worsen or fail to improve, for copd emphysema.   [name] Llamas, MD [facility]monary [facility] [place]

## 2020-01-25 ENCOUNTER — Encounter: Payer: Medicare Other | Admitting: Gastroenterology

## 2020-02-07 ENCOUNTER — Other Ambulatory Visit: Payer: Self-pay | Admitting: Cardiology

## 2020-02-08 ENCOUNTER — Other Ambulatory Visit: Payer: Self-pay

## 2020-02-08 ENCOUNTER — Ambulatory Visit (AMBULATORY_SURGERY_CENTER): Payer: Self-pay | Admitting: *Deleted

## 2020-02-08 VITALS — Ht 76.0 in | Wt 176.0 lb

## 2020-02-08 DIAGNOSIS — Z860100 Personal history of colon polyps, unspecified: Secondary | ICD-10-CM

## 2020-02-08 DIAGNOSIS — Z01818 Encounter for other preprocedural examination: Secondary | ICD-10-CM

## 2020-02-08 NOTE — Progress Notes (Signed)
Procedure at [facility] 02/22/20. No egg or soy allergy known to patient  No issues with past sedation with any surgeries  or procedures, no intubation problems  No diet pills per patient No home 02 use per patient  No blood thinners per patient  Pt denies issues with constipation  No A fib or A flutter  EMMI video sent to pt's e mail   Due to the COVID-19 pandemic we are asking patients to follow these guidelines. Please only bring one care partner. Please be aware that your care partner may wait in the car in the parking lot or if they feel like they will be too hot to wait in the car, they may wait in the lobby on the 4th floor. All care partners are required to wear a mask the entire time (we do not have any that we can provide them), they need to practice social distancing, and we will do a Covid check for all patient's and care partners when you arrive. Also we will check their temperature and your temperature. If the care partner waits in their car they need to stay in the parking lot the entire time and we will call them on their cell phone when the patient is ready for discharge so they can bring the car to the front of the building. Also all patient's will need to wear a mask into building.

## 2020-02-14 ENCOUNTER — Telehealth: Payer: Self-pay | Admitting: *Deleted

## 2020-02-14 ENCOUNTER — Other Ambulatory Visit: Payer: Self-pay | Admitting: Family

## 2020-02-14 NOTE — Telephone Encounter (Signed)
pts wife called states that procedure instructions have the wrong day on them.  Printing off new ones and will leave at the 3rd floor desk.

## 2020-02-18 ENCOUNTER — Other Ambulatory Visit (HOSPITAL_COMMUNITY)
Admission: RE | Admit: 2020-02-18 | Discharge: 2020-02-18 | Disposition: A | Discharge status: Home / Self Care | Payer: Medicare Other | Source: Ambulatory Visit | Attending: Gastroenterology | Admitting: Gastroenterology

## 2020-02-18 DIAGNOSIS — Z20822 Contact with and (suspected) exposure to covid-19: Secondary | ICD-10-CM | POA: Insufficient documentation

## 2020-02-18 DIAGNOSIS — Z01812 Encounter for preprocedural laboratory examination: Secondary | ICD-10-CM | POA: Diagnosis present

## 2020-02-18 LAB — SARS CORONAVIRUS 2 (TAT 6-24 HRS): SARS Coronavirus 2: NEGATIVE

## 2020-02-21 NOTE — Progress Notes (Signed)
Pt pre call done, informed to remain quarantined until after procedure, pts wife to provide ride to and from hospital, home meds discussed, npo after mn except some meds, please check accu-check in am, hold am insulin

## 2020-02-22 ENCOUNTER — Ambulatory Visit (HOSPITAL_COMMUNITY): Payer: Medicare Other | Admitting: Certified Registered Nurse Anesthetist

## 2020-02-22 ENCOUNTER — Ambulatory Visit (HOSPITAL_COMMUNITY)
Admission: RE | Admit: 2020-02-22 | Discharge: 2020-02-22 | Disposition: A | Discharge status: Home / Self Care | Payer: Medicare Other | Attending: Gastroenterology | Admitting: Gastroenterology

## 2020-02-22 ENCOUNTER — Encounter (HOSPITAL_COMMUNITY)
Admission: RE | Disposition: A | Discharge status: Home / Self Care | Payer: Self-pay | Source: Home / Self Care | Attending: Gastroenterology

## 2020-02-22 ENCOUNTER — Encounter (HOSPITAL_COMMUNITY): Payer: Self-pay | Admitting: Gastroenterology

## 2020-02-22 DIAGNOSIS — Z952 Presence of prosthetic heart valve: Secondary | ICD-10-CM | POA: Insufficient documentation

## 2020-02-22 DIAGNOSIS — Z9981 Dependence on supplemental oxygen: Secondary | ICD-10-CM | POA: Diagnosis not present

## 2020-02-22 DIAGNOSIS — Z951 Presence of aortocoronary bypass graft: Secondary | ICD-10-CM | POA: Insufficient documentation

## 2020-02-22 DIAGNOSIS — Z79899 Other long term (current) drug therapy: Secondary | ICD-10-CM | POA: Diagnosis not present

## 2020-02-22 DIAGNOSIS — I10 Essential (primary) hypertension: Secondary | ICD-10-CM | POA: Diagnosis not present

## 2020-02-22 DIAGNOSIS — Z87891 Personal history of nicotine dependence: Secondary | ICD-10-CM | POA: Insufficient documentation

## 2020-02-22 DIAGNOSIS — J449 Chronic obstructive pulmonary disease, unspecified: Secondary | ICD-10-CM | POA: Insufficient documentation

## 2020-02-22 DIAGNOSIS — Z09 Encounter for follow-up examination after completed treatment for conditions other than malignant neoplasm: Secondary | ICD-10-CM | POA: Diagnosis present

## 2020-02-22 DIAGNOSIS — Z86010 Personal history of colonic polyps: Secondary | ICD-10-CM | POA: Insufficient documentation

## 2020-02-22 DIAGNOSIS — D120 Benign neoplasm of cecum: Secondary | ICD-10-CM

## 2020-02-22 DIAGNOSIS — E119 Type 2 diabetes mellitus without complications: Secondary | ICD-10-CM | POA: Insufficient documentation

## 2020-02-22 DIAGNOSIS — Z1211 Encounter for screening for malignant neoplasm of colon: Secondary | ICD-10-CM

## 2020-02-22 DIAGNOSIS — I2510 Atherosclerotic heart disease of native coronary artery without angina pectoris: Secondary | ICD-10-CM | POA: Diagnosis not present

## 2020-02-22 HISTORY — PX: COLONOSCOPY WITH PROPOFOL: SHX5780

## 2020-02-22 HISTORY — PX: POLYPECTOMY: SHX5525

## 2020-02-22 HISTORY — PX: HEMOSTASIS CLIP PLACEMENT: SHX6857

## 2020-02-22 LAB — GLUCOSE, CAPILLARY: Glucose-Capillary: 124 mg/dL — ABNORMAL HIGH (ref 70–99)

## 2020-02-22 SURGERY — COLONOSCOPY WITH PROPOFOL
Anesthesia: Monitor Anesthesia Care

## 2020-02-22 MED ORDER — PROPOFOL 10 MG/ML IV BOLUS
INTRAVENOUS | Status: AC
  Filled 2020-02-22: qty 20

## 2020-02-22 MED ORDER — PROPOFOL 1000 MG/100ML IV EMUL
INTRAVENOUS | Status: AC
  Filled 2020-02-22: qty 100

## 2020-02-22 MED ORDER — LIDOCAINE 2% (20 MG/ML) 5 ML SYRINGE
INTRAMUSCULAR | Status: DC | PRN
Start: 2020-02-22 — End: 2020-02-22
  Administered 2020-02-22: 40 mg via INTRAVENOUS

## 2020-02-22 MED ORDER — PROPOFOL 10 MG/ML IV BOLUS
INTRAVENOUS | Status: DC | PRN
  Administered 2020-02-22: 20 mg via INTRAVENOUS

## 2020-02-22 MED ORDER — PROPOFOL 500 MG/50ML IV EMUL
INTRAVENOUS | Status: DC | PRN
  Administered 2020-02-22: 100 ug/kg/min via INTRAVENOUS

## 2020-02-22 MED ORDER — LACTATED RINGERS IV SOLN
INTRAVENOUS | Status: DC | PRN
Start: 2020-02-22 — End: 2020-02-22

## 2020-02-22 MED ORDER — SODIUM CHLORIDE 0.9 % IV SOLN: INTRAVENOUS | Status: DC

## 2020-02-22 MED ORDER — LACTATED RINGERS IV SOLN: Freq: Once | INTRAVENOUS | Status: AC

## 2020-02-22 MED ORDER — EPHEDRINE SULFATE-NACL 50-0.9 MG/10ML-% IV SOSY
PREFILLED_SYRINGE | INTRAVENOUS | Status: DC | PRN
  Administered 2020-02-22 (×2): 10 mg via INTRAVENOUS

## 2020-02-22 SURGICAL SUPPLY — 22 items

## 2020-02-22 NOTE — Discharge Instructions (Signed)

## 2020-02-22 NOTE — Interval H&P Note (Signed)
History and Physical Interval Note:  02/22/2020 10:25 AM  [name]  has presented today for surgery, with the diagnosis of Screening for colon cancer.  The various methods of treatment have been discussed with the patient and family. After consideration of risks, benefits and other options for treatment, the patient has consented to  Procedure(s): COLONOSCOPY WITH PROPOFOL (N/A) as a surgical intervention.  The patient's history has been reviewed, patient examined, no change in status, stable for surgery.  I have reviewed the patient's chart and labs.  Questions were answered to the patient's satisfaction.     [name]

## 2020-02-22 NOTE — Anesthesia Preprocedure Evaluation (Signed)
Anesthesia Evaluation  Patient identified by MRN, date of birth, ID band Patient awake    Reviewed: Allergy & Precautions, NPO status , Patient's Chart, lab work & pertinent test results  Airway Mallampati: II  TM Distance: >3 FB Neck ROM: Full    Dental no notable dental hx.    Pulmonary COPD,  oxygen dependent, former smoker,    Pulmonary exam normal breath sounds clear to auscultation       Cardiovascular hypertension, Pt. on medications and Pt. on home beta blockers + CAD and + CABG  Normal cardiovascular exam Rhythm:Regular Rate:Normal  S/P AVR   Neuro/Psych negative neurological ROS  negative psych ROS   GI/Hepatic negative GI ROS, Neg liver ROS,   Endo/Other  negative endocrine ROSdiabetes  Renal/GU negative Renal ROS  negative genitourinary   Musculoskeletal negative musculoskeletal ROS (+)   Abdominal   Peds negative pediatric ROS (+)  Hematology negative hematology ROS (+)   Anesthesia Other Findings   Reproductive/Obstetrics negative OB ROS                             Anesthesia Physical Anesthesia Plan  ASA: III  Anesthesia Plan: MAC   Post-op Pain Management:    Induction: Intravenous  PONV Risk Score and Plan: 0  Airway Management Planned: Simple Face Mask  Additional Equipment:   Intra-op Plan:   Post-operative Plan:   Informed Consent: I have reviewed the patients History and Physical, chart, labs and discussed the procedure including the risks, benefits and alternatives for the proposed anesthesia with the patient or authorized representative who has indicated his/her understanding and acceptance.     Dental advisory given  Plan Discussed with: CRNA and Surgeon  Anesthesia Plan Comments:         Anesthesia Quick Evaluation

## 2020-02-22 NOTE — Transfer of Care (Signed)
Immediate Anesthesia Transfer of Care Note  Patient: [name]  Procedure(s) Performed: COLONOSCOPY WITH PROPOFOL (N/A ) POLYPECTOMY HEMOSTASIS CLIP PLACEMENT  Patient Location: Endoscopy Unit  Anesthesia Type:MAC  Level of Consciousness: awake, alert , oriented and patient cooperative  Airway & Oxygen Therapy: Patient Spontanous Breathing and Patient connected to face mask oxygen  Post-op Assessment: Report given to RN and Post -op Vital signs reviewed and stable  Post vital signs: Reviewed and stable  Last Vitals:  Vitals Value Taken Time  BP    Temp    Pulse    Resp    SpO2      Last Pain:  Vitals:   02/22/20 0923  TempSrc: Oral  PainSc: 0-No pain         Complications: No complications documented.

## 2020-02-22 NOTE — Anesthesia Procedure Notes (Signed)
Procedure Name: MAC Date/Time: 02/22/2020 10:27 AM Performed by: West Pugh, CRNA Pre-anesthesia Checklist: Patient identified, Emergency Drugs available, Suction available, Patient being monitored and Timeout performed Patient Re-evaluated:Patient Re-evaluated prior to induction Oxygen Delivery Method: Simple face mask Preoxygenation: Pre-oxygenation with 100% oxygen Induction Type: IV induction Placement Confirmation: positive ETCO2 Dental Injury: Teeth and Oropharynx as per pre-operative assessment

## 2020-02-22 NOTE — Anesthesia Postprocedure Evaluation (Signed)
Anesthesia Post Note  Patient: PRAVEEN COIA  Procedure(s) Performed: COLONOSCOPY WITH PROPOFOL (N/A ) POLYPECTOMY HEMOSTASIS CLIP PLACEMENT     Patient location during evaluation: PACU Anesthesia Type: MAC Level of consciousness: awake and alert Pain management: pain level controlled Vital Signs Assessment: post-procedure vital signs reviewed and stable Respiratory status: spontaneous breathing, nonlabored ventilation, respiratory function stable and patient connected to nasal cannula oxygen Cardiovascular status: stable and blood pressure returned to baseline Postop Assessment: no apparent nausea or vomiting Anesthetic complications: no   No complications documented.  Last Vitals:  Vitals:   02/22/20 0923 02/22/20 1107  BP: (!) 171/99 (!) 112/52  Pulse:  72  Resp: 19 20  Temp: 36.7 C 36.6 C  SpO2: 95% 100%    Last Pain:  Vitals:   02/22/20 1107  TempSrc: Oral  PainSc: 0-No pain                 Antoine Vandermeulen S

## 2020-02-22 NOTE — Op Note (Addendum)
[facility] Patient Name: [name] Procedure Date: 02/22/2020 MRN: [id number] Attending MD: Estill Cotta. Loletha Carrow , MD Date of Birth: Feb 15, 1951 CSN: [id number] Age: 69 Admit Type: Outpatient Procedure:                Colonoscopy Indications:              Surveillance: Personal history of adenomatous                            polyps on last colonoscopy 3 years ago (38m rectal                            TA 10/2016) Providers:                HEstill Cotta DLoletha Carrow MD, EDoristine Johns RN, JLina Sar Technician, KChristell Faith CRNA Referring MD:             MDebbrah Alar NP Medicines:                Monitored Anesthesia Care Complications:            No immediate complications. Estimated Blood Loss:     Estimated blood loss was minimal. Procedure:                Pre-Anesthesia Assessment:                           - Prior to the procedure, a History and Physical                            was performed, and patient medications and                            allergies were reviewed. The patient's tolerance of                            previous anesthesia was also reviewed. The risks                            and benefits of the procedure and the sedation                            options and risks were discussed with the patient.                            All questions were answered, and informed consent                            was obtained. Prior Anticoagulants: The patient has                            taken no previous anticoagulant or antiplatelet  agents except for aspirin. ASA Grade Assessment:                            III - A patient with severe systemic disease. After                            reviewing the risks and benefits, the patient was                            deemed in satisfactory condition to undergo the                            procedure.                           After obtaining  informed consent, the colonoscope                            was passed under direct vision. Throughout the                            procedure, the patient's blood pressure, pulse, and                            oxygen saturations were monitored continuously. The                            CF-HQ190L ([id number]) Olympus colonoscope was                            introduced through the anus and advanced to the the                            cecum, identified by appendiceal orifice and                            ileocecal valve. The colonoscopy was performed                            without difficulty. The patient tolerated the                            procedure well. The quality of the bowel                            preparation was initially fair, then improved to                            good with lavage. The ileocecal valve, appendiceal                            orifice, and rectum were photographed. The bowel  preparation used was Miralax. Scope In: 10:33:36 AM Scope Out: 10:59:33 AM Scope Withdrawal Time: 0 hours 24 minutes 10 seconds  Total Procedure Duration: 0 hours 25 minutes 57 seconds  Findings:      The perianal and digital rectal examinations were normal.      A 13-15 mm polyp was found in the cecum. The polyp was multi-lobulated       and sessile. The polyp was removed with a piecemeal technique using a       cold snare, and two diminuntive remaining edge pieces removed with a       cold forceps. Resection and retrieval were complete. To prevent bleeding       post-intervention, two hemostatic clips were successfully placed (MR       conditional). There was no bleeding at the end of the procedure.      The exam was otherwise without abnormality on direct and retroflexion       views. Impression:               - One 15 mm polyp in the cecum, removed piecemeal                            using a cold snare. Resected and retrieved. Clips                             (MR conditional) were placed.                           - The examination was otherwise normal on direct                            and retroflexion views. Moderate Sedation:      MAC sedation used Recommendation:           - Patient has a contact number available for                            emergencies. The signs and symptoms of potential                            delayed complications were discussed with the                            patient. Return to normal activities tomorrow.                            Written discharge instructions were provided to the                            patient.                           - Resume previous diet.                           - Continue present medications.                           -  Await pathology results.                           - Repeat colonoscopy is recommended for                            surveillance. The colonoscopy date will be                            determined after pathology results from today's                            exam become available for review.                           - No aspirin, ibuprofen, naproxen, or other                            non-steroidal anti-inflammatory drugs for 7 days                            after polyp removal. Procedure Code(s):        --- Professional ---                           [phone], Colonoscopy, flexible; with removal of                            tumor(s), polyp(s), or other lesion(s) by snare                            technique Diagnosis Code(s):        --- Professional ---                           Z86.010, Personal history of colonic polyps                           K63.5, Polyp of colon CPT copyright 2019 American Medical Association. All rights reserved. The codes documented in this report are preliminary and upon coder review may  be revised to meet current compliance requirements. Kasia Trego L. Loletha Carrow, MD 02/22/2020 11:09:23 AM This report has been  signed electronically. Number of Addenda: 0

## 2020-02-22 NOTE — H&P (Signed)
History:  This patient presents for endoscopic testing for history of colon polyp.  [name] Referring physician: Debbrah Alar, NP  Past Medical History: Past Medical History:  Diagnosis Date  . Abnormal myocardial perfusion study 2006   EF 44% ? inferoseptal ischemia. no cath done  . Breast mass, right 03/2008  . Contact dermatitis 01/31/2013  . COPD (chronic obstructive pulmonary disease) ([place])    "dx'd 12/2017"  . Coronary artery disease   . Heart murmur   . History of colonic polyps   . Hyperlipidemia   . Hypertension   . Low back pain   . Oxygen deficiency    uses 2L at night  . Type II diabetes mellitus ([place])      Past Surgical History: Past Surgical History:  Procedure Laterality Date  . ABDOMINAL AORTOGRAM N/A 01/13/2018   Procedure: ABDOMINAL AORTOGRAM;  Surgeon: Troy Sine, MD;  Location: [place] [facility];  Service: Cardiovascular;  Laterality: N/A;  . AORTIC VALVE REPLACEMENT N/A 03/04/2018   Procedure: AORTIC VALVE REPLACEMENT (AVR) 61mm Edwards Magna Ease Tissue Valve.;  Surgeon: Melrose Nakayama, MD;  Location: [place];  Service: Open Heart Surgery;  Laterality: N/A;  . CARDIAC VALVE REPLACEMENT    . COLONOSCOPY W/ POLYPECTOMY    . CORONARY ARTERY BYPASS GRAFT N/A 03/04/2018   Procedure: CORONARY ARTERY BYPASS GRAFTING (CABG) x1:  LIMA to LAD.;  Surgeon: Melrose Nakayama, MD;  Location: [place];  Service: Open Heart Surgery;  Laterality: N/A;  . LIPOMA EXCISION Left 07/2008    "back of shoulder" Dr Gershon Crane  . RIGHT/LEFT HEART CATH AND CORONARY ANGIOGRAPHY N/A 01/13/2018   Procedure: RIGHT/LEFT HEART CATH AND CORONARY ANGIOGRAPHY;  Surgeon: Troy Sine, MD;  Location: [place] [facility];  Service: Cardiovascular;  Laterality: N/A;  . TEE WITHOUT CARDIOVERSION N/A 03/04/2018   Procedure: TRANSESOPHAGEAL ECHOCARDIOGRAM (TEE);  Surgeon: Melrose Nakayama, MD;  Location: [place];  Service: Open Heart Surgery;  Laterality: N/A;  .  TONSILLECTOMY    . WISDOM TOOTH EXTRACTION      Allergies: Allergies  Allergen Reactions  . Amlodipine Besylate Other (See Comments)    REACTION: ? caused left axillary nodules/chest flutter  . Hydrochlorothiazide Hives  . Lisinopril     Hyperkalemia     Outpatient Meds: Current Facility-Administered Medications  Medication Dose Route Frequency Provider Last Rate Last Admin  . 0.9 %  sodium chloride infusion   Intravenous Continuous Danis, Estill Cotta III, MD          ___________________________________________________________________ Objective   Exam:  BP (!) 171/99   Temp 98 F (36.7 C) (Oral)   Resp 19   Ht 6\' 4"  (1.93 m)   Wt 79.8 kg   SpO2 95%   BMI 21.42 kg/m    CV: RRR without murmur, S1/S2, no JVD, no peripheral edema  Resp: clear to auscultation bilaterally, normal RR and effort noted  GI: soft, no tenderness, with active bowel sounds. No guarding or palpable organomegaly noted.  Neuro: awake, alert and oriented x 3. Normal gross motor function and fluent speech   Assessment:  History colon polyp  Plan:  Surveillance colonoscopy   [name]

## 2020-02-23 ENCOUNTER — Encounter (HOSPITAL_COMMUNITY): Payer: Self-pay | Admitting: Gastroenterology

## 2020-02-23 LAB — SURGICAL PATHOLOGY

## 2020-03-03 ENCOUNTER — Encounter: Payer: Self-pay | Admitting: Gastroenterology

## 2020-03-20 ENCOUNTER — Other Ambulatory Visit: Payer: Self-pay | Admitting: Family

## 2020-03-21 NOTE — Progress Notes (Signed)
Subjective:   [name] is a 69 y.o. male who presents for Medicare Annual/Subsequent preventive examination.  Review of Systems    Cardiac Risk Factors include: advanced age (>61men, >2 women);diabetes mellitus;dyslipidemia;male gender     Objective:    Today's Vitals   03/23/20 1032  BP: (!) 158/90  Pulse: 84  Temp: (!) 97 F (36.1 C)  TempSrc: Temporal  Weight: 169 lb 3.2 oz (76.7 kg)  Height: 6\' 4"  (1.93 m)   Body mass index is 20.6 kg/m.  Advanced Directives 03/23/2020 02/22/2020 03/22/2019 03/09/2018 03/04/2018 03/02/2018 01/13/2018  Does Patient Have a Medical Advance Directive? No Yes Yes No No No No  Type of Advance Directive - [place]  Does patient want to make changes to medical advance directive? - - No - Patient declined - - - -  Copy of [place] in Chart? - Yes - validated most recent copy scanned in chart (See row information) - - - - -  Would patient like information on creating a medical advance directive? No - Patient declined - - No - Patient declined No - Patient declined No - Patient declined Yes (MAU/Ambulatory/Procedural Areas - Information given)    Current Medications (verified) Outpatient Encounter Medications as of 03/23/2020  Medication Sig  . aspirin EC 325 MG EC tablet Take 1 tablet (325 mg total) by mouth daily.  . cetirizine (ZYRTEC) 10 MG tablet Take 10 mg by mouth daily as needed for allergies.  . Cyanocobalamin (VITAMIN B-12 PO) Take 1 tablet by mouth daily.  . dapagliflozin propanediol (FARXIGA) 5 MG TABS tablet Take 5 mg by mouth daily.  . furosemide (LASIX) 20 MG tablet TAKE 1 TABLET BY MOUTH EVERY DAY (Patient taking differently: Take 20 mg by mouth daily. )  . glucose blood (PRODIGY NO CODING BLOOD GLUC) test strip Use as instructed to check blood sugar twice a day.  DX  E11.40  . hydrALAZINE (APRESOLINE) 25 MG tablet TAKE 1 TABLET BY MOUTH THREE TIMES A DAY (Patient taking  differently: Take 25 mg by mouth 3 (three) times daily. )  . insulin aspart (NOVOLOG FLEXPEN) 100 UNIT/ML FlexPen INJECT 11-15 UNITS SUBCUTANEOUSLY DAILY BEFORE SUPPER  . insulin detemir (LEVEMIR FLEXTOUCH) 100 UNIT/ML FlexPen INJECT 50 UNITS INTO THE SKIN DAILY AT 10 PM. (Patient taking differently: Inject 50 Units into the skin daily. INJECT 50 UNITS INTO THE SKIN DAILY)  . Insulin Pen Needle (B-D ULTRAFINE III SHORT PEN) 31G X 8 MM MISC USE AS DIRECTED  . metFORMIN (GLUCOPHAGE) 1000 MG tablet Take 1 tablet (1,000 mg total) by mouth 2 (two) times daily with a meal.  . metoprolol tartrate (LOPRESSOR) 50 MG tablet Take 1 tablet (50 mg total) by mouth 2 (two) times daily.  . rosuvastatin (CRESTOR) 20 MG tablet TAKE 1 TABLET BY MOUTH EVERY DAY  . tadalafil (CIALIS) 20 MG tablet Take 1 tablet (20 mg total) by mouth daily as needed for erectile dysfunction.   No facility-administered encounter medications on file as of 03/23/2020.    Allergies (verified) Amlodipine besylate, Hydrochlorothiazide, and Lisinopril   History: Past Medical History:  Diagnosis Date  . Abnormal myocardial perfusion study 2006   EF 44% ? inferoseptal ischemia. no cath done  . Breast mass, right 03/2008  . Contact dermatitis 01/31/2013  . COPD (chronic obstructive pulmonary disease) ([place])    "dx'd 12/2017"  . Coronary artery disease   . Heart murmur   . History  of colonic polyps   . Hyperlipidemia   . Hypertension   . Low back pain   . Oxygen deficiency    uses 2L at night  . Type II diabetes mellitus ([place])    Past Surgical History:  Procedure Laterality Date  . ABDOMINAL AORTOGRAM N/A 01/13/2018   Procedure: ABDOMINAL AORTOGRAM;  Surgeon: Troy Sine, MD;  Location: [place] [facility];  Service: Cardiovascular;  Laterality: N/A;  . AORTIC VALVE REPLACEMENT N/A 03/04/2018   Procedure: AORTIC VALVE REPLACEMENT (AVR) 23mm Edwards Magna Ease Tissue Valve.;  Surgeon: Melrose Nakayama, MD;  Location: [place];   Service: Open Heart Surgery;  Laterality: N/A;  . CARDIAC VALVE REPLACEMENT    . COLONOSCOPY W/ POLYPECTOMY    . COLONOSCOPY WITH PROPOFOL N/A 02/22/2020   Procedure: COLONOSCOPY WITH PROPOFOL;  Surgeon: Doran Stabler, MD;  Location: [facility];  Service: Gastroenterology;  Laterality: N/A;  . CORONARY ARTERY BYPASS GRAFT N/A 03/04/2018   Procedure: CORONARY ARTERY BYPASS GRAFTING (CABG) x1:  LIMA to LAD.;  Surgeon: Melrose Nakayama, MD;  Location: [place];  Service: Open Heart Surgery;  Laterality: N/A;  . HEMOSTASIS CLIP PLACEMENT  02/22/2020   Procedure: HEMOSTASIS CLIP PLACEMENT;  Surgeon: Doran Stabler, MD;  Location: [facility];  Service: Gastroenterology;;  . LIPOMA EXCISION Left 07/2008    "back of shoulder" Dr Gershon Crane  . POLYPECTOMY  02/22/2020   Procedure: POLYPECTOMY;  Surgeon: Doran Stabler, MD;  Location: [facility];  Service: Gastroenterology;;  . RIGHT/LEFT HEART CATH AND CORONARY ANGIOGRAPHY N/A 01/13/2018   Procedure: RIGHT/LEFT HEART CATH AND CORONARY ANGIOGRAPHY;  Surgeon: Troy Sine, MD;  Location: [place] [facility];  Service: Cardiovascular;  Laterality: N/A;  . TEE WITHOUT CARDIOVERSION N/A 03/04/2018   Procedure: TRANSESOPHAGEAL ECHOCARDIOGRAM (TEE);  Surgeon: Melrose Nakayama, MD;  Location: [place];  Service: Open Heart Surgery;  Laterality: N/A;  . TONSILLECTOMY    . WISDOM TOOTH EXTRACTION     Family History  Problem Relation Age of Onset  . Melanoma Father 9       deceased secondary to melanoma  . Alzheimer's disease Father   . Diabetes Father   . Hypertension Mother        alive -3  . Colon cancer Neg Hx   . Esophageal cancer Neg Hx   . Colon polyps Neg Hx   . Stomach cancer Neg Hx    Social History   Socioeconomic History  . Marital status: Married    Spouse name: Not on file  . Number of children: Not on file  . Years of education: Not on file  . Highest education level: Not on file  Occupational History  . Not on  file  Tobacco Use  . Smoking status: Former Smoker    Packs/day: 1.50    Years: 41.00    Pack years: 61.50    Types: Cigarettes    Quit date: 08/26/2010    Years since quitting: 9.5  . Smokeless tobacco: Never Used  . Tobacco comment: Quit 2012  Vaping Use  . Vaping Use: Never used  Substance and Sexual Activity  . Alcohol use: Yes    Comment: occasionally  . Drug use: Not Currently  . Sexual activity: Not Currently  Other Topics Concern  . 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 Difficulty of Paying Living Expenses: Not hard at all  Food Insecurity: No Food Insecurity  . Worried About Charity fundraiser in the Last Year: Never true  . Ran Out of Food in the Last Year: Never true  Transportation Needs: No Transportation Needs  . Lack of Transportation (Medical): No  . Lack of Transportation (Non-Medical): No  Physical Activity:   . Days of Exercise per Week:   . Minutes of Exercise per Session:   Stress:   . Feeling of Stress :   Social Connections:   . Frequency of Communication with Friends and Family:   . Frequency of Social Gatherings with Friends and Family:   . Attends Religious Services:   . Active Member of Clubs or Organizations:   . Attends Archivist Meetings:   Marland Kitchen Marital Status:     Tobacco Counseling Counseling given: Not Answered Comment: Quit 2012   Clinical Intake: Pain : No/denies pain    Activities of Daily Living In your present state of health, do you have any difficulty performing the following activities: 03/23/2020  Hearing? Y  Vision? N  Difficulty concentrating or making decisions? N  Walking or climbing stairs? N  Dressing or bathing? N  Doing errands, shopping? N  Preparing Food and eating ? N  Using the Toilet?  N  In the past six months, have you accidently leaked urine? N  Do you have problems with loss of bowel control? N  Managing your Medications? N  Managing your Finances? N  Housekeeping or managing your Housekeeping? N  Some recent data might be hidden    Patient Care Team: Debbrah Alar, NP as PCP - General (Internal Medicine) Park Liter, MD as PCP - Cardiology (Cardiology) Philemon Kingdom, MD as Consulting Physician (Internal Medicine) Spero Geralds, MD as Consulting Physician (Pulmonary Disease)  Indicate any recent Medical Services you may have received from other than [facility] providers in the past year (date may be approximate).     Assessment:   This is a routine wellness examination for [name].  Dietary issues and exercise activities discussed: Current Exercise Habits: The patient does not participate in regular exercise at present, Exercise limited by: None identified Diet (meal preparation, eat out, water intake, caffeinated beverages, dairy products, fruits and vegetables): well balanced, on average, 2 meals per day   Goals    . Increase physical activity     Do bike 15 min/ 2 days per week.      Depression Screen PHQ 2/9 Scores 03/23/2020 03/22/2019 08/05/2018 05/06/2018 03/12/2018 02/05/2017 05/07/2013  PHQ - 2 Score 0 0 0 0 0 1 0  PHQ- 9 Score - - - - 1 2 -    Fall Risk Fall Risk  03/23/2020 03/22/2019 03/12/2018 02/05/2017 02/05/2017  Falls in the past year? 0 0 No No No  Number falls in past yr: 0 - - - -  Injury with Fall? 0 - - - -  Follow up Education provided;Falls prevention discussed - - - -   Lives w/ wife in 1 story home.  Any stairs in or around the home? No  If so, are there any without handrails? No  Home free of loose throw rugs in walkways, pet beds, electrical cords, etc? Yes  Adequate lighting in your home to reduce risk of falls?  Yes   ASSISTIVE DEVICES UTILIZED TO PREVENT FALLS: no   TIMED UP AND GO:  Was the test performed?  No .   Gait steady and fast without use of assistive device  Cognitive Function: Ad8 score reviewed for issues:  Issues making decisions:  Less interest in hobbies / activities:  Repeats questions, stories (family complaining):  Trouble using ordinary gadgets (microwave, computer, phone):  Forgets the month or year:   Mismanaging finances:   Remembering appts:  Daily problems with thinking and/or memory: Ad8 score is=            Immunizations Immunization History  Administered Date(s) Administered  . Fluad Quad(high Dose 65+) 05/26/2019  . Influenza Split 08/04/2012  . Influenza Whole 05/20/2008, 07/12/2009, 05/02/2010  . Influenza, High Dose Seasonal PF 05/20/2017, 06/10/2018  . Influenza,inj,Quad PF,6+ Mos 05/07/2013, 05/09/2014, 06/07/2015, 05/01/2016  . PFIZER SARS-COV-2 Vaccination 11/16/2019, 12/14/2019  . Pneumococcal Conjugate-13 02/05/2017  . Pneumococcal Polysaccharide-23 05/20/2008, 08/25/2018  . Td 08/26/2008     Flu Vaccine status: Up to date Pneumococcal vaccine status: Up to date Covid-19 vaccine status: Completed vaccines  Qualifies for Shingles Vaccine? Yes    Shingrix Completed?: No.    Education has been provided regarding the importance of this vaccine. Patient has been advised to call insurance company to determine out of pocket expense if they have not yet received this vaccine. Advised may also receive vaccine at local pharmacy or Health Dept. Verbalized acceptance and understanding.  Screening Tests Health Maintenance  Topic Date Due  . TETANUS/TDAP  10/10/2018  . INFLUENZA VACCINE  03/26/2020  . HEMOGLOBIN A1C  07/08/2020  . OPHTHALMOLOGY EXAM  09/12/2020  . FOOT EXAM  09/30/2020  . COLONOSCOPY  03/04/2023  . COVID-19 Vaccine  Completed  . Hepatitis C Screening  Completed  . PNA vac Low Risk Adult  Completed    Health Maintenance  Health Maintenance Due  Topic Date Due  . TETANUS/TDAP  10/10/2018    Colorectal cancer  screening: Completed 03/03/20. Repeat every 3 years  Lung Cancer Screening -done 04/26/19  Additional Screening:  Hepatitis C Screening: does qualify; Completed 09/08/18  Vision Screening: Recommended annual ophthalmology exams for early detection of glaucoma and other disorders of the eye. Is the patient up to date with their annual eye exam?  Yes  Who is the provider or what is the name of the office in which the patient attends annual eye exams? Dr.Gould  Dental Screening: Recommended annual dental exams for proper oral hygiene  Community Resource Referral / Chronic Care Management: CRR required this visit?  No   CCM required this visit?  No      Plan:    See you next year!  Continue to eat heart healthy diet (full of fruits, vegetables, whole grains, lean protein, water--limit salt, fat, and sugar intake) and increase physical activity as tolerated.  Continue doing brain stimulating activities (puzzles, reading, adult coloring books, staying active) to keep memory sharp.   Bring a copy of your living will and/or healthcare power of attorney to your next office visit.   I have personally reviewed and noted the following in the patient's chart:   . Medical and social history . Use of alcohol, tobacco or illicit drugs  . Current medications and supplements . Functional ability and status . Nutritional status . Physical activity . Advanced directives . List of other physicians . Hospitalizations, surgeries, and ER visits in previous 12 months . Vitals . Screenings to include cognitive, depression, and  falls . Referrals and appointments  In addition, I have reviewed and discussed with patient certain preventive protocols, quality metrics, and best practice recommendations. A written personalized care plan for preventive services as well as general preventive health recommendations were provided to patient.     Shela Nevin, [place]   03/23/2020   Nurse Notes: Wears  2 liters of O2 via [place] at night for COPD.

## 2020-03-23 ENCOUNTER — Ambulatory Visit (INDEPENDENT_AMBULATORY_CARE_PROVIDER_SITE_OTHER): Payer: Medicare Other | Admitting: Family

## 2020-03-23 ENCOUNTER — Ambulatory Visit (INDEPENDENT_AMBULATORY_CARE_PROVIDER_SITE_OTHER): Payer: Medicare Other | Admitting: *Deleted

## 2020-03-23 ENCOUNTER — Encounter: Payer: Self-pay | Admitting: *Deleted

## 2020-03-23 ENCOUNTER — Other Ambulatory Visit: Payer: Self-pay

## 2020-03-23 VITALS — BP 158/90 | HR 84 | Temp 97.0°F | Ht 76.0 in | Wt 169.2 lb

## 2020-03-23 VITALS — BP 141/80 | HR 80 | Temp 98.1°F | Resp 16 | Ht 76.0 in | Wt 169.0 lb

## 2020-03-23 DIAGNOSIS — I10 Essential (primary) hypertension: Secondary | ICD-10-CM | POA: Diagnosis not present

## 2020-03-23 DIAGNOSIS — E1140 Type 2 diabetes mellitus with diabetic neuropathy, unspecified: Secondary | ICD-10-CM

## 2020-03-23 DIAGNOSIS — J449 Chronic obstructive pulmonary disease, unspecified: Secondary | ICD-10-CM | POA: Diagnosis not present

## 2020-03-23 DIAGNOSIS — E1165 Type 2 diabetes mellitus with hyperglycemia: Secondary | ICD-10-CM

## 2020-03-23 DIAGNOSIS — E1169 Type 2 diabetes mellitus with other specified complication: Secondary | ICD-10-CM

## 2020-03-23 DIAGNOSIS — E785 Hyperlipidemia, unspecified: Secondary | ICD-10-CM | POA: Diagnosis not present

## 2020-03-23 DIAGNOSIS — Z0000 Encounter for general adult medical examination without abnormal findings: Secondary | ICD-10-CM

## 2020-03-23 DIAGNOSIS — IMO0002 Reserved for concepts with insufficient information to code with codable children: Secondary | ICD-10-CM

## 2020-03-23 LAB — BASIC METABOLIC PANEL
BUN: 16 mg/dL (ref 6–23)
CO2: 27 mEq/L (ref 19–32)
Calcium: 9.4 mg/dL (ref 8.4–10.5)
Chloride: 104 mEq/L (ref 96–112)
Creatinine, Ser: 0.81 mg/dL (ref 0.40–1.50)
GFR: 114.47 mL/min (ref >60.00)
Glucose, Bld: 70 mg/dL (ref 70–99)
Potassium: 3.8 mEq/L (ref 3.5–5.1)
Sodium: 140 mEq/L (ref 135–145)

## 2020-03-23 LAB — LIPID PANEL
Cholesterol: 127 mg/dL (ref 0–200)
HDL: 50 mg/dL (ref >39.00)
LDL Cholesterol: 64 mg/dL (ref 0–99)
NonHDL: 76.87
Total CHOL/HDL Ratio: 3
Triglycerides: 66 mg/dL (ref 0.0–149.0)
VLDL: 13.2 mg/dL (ref 0.0–40.0)

## 2020-03-23 LAB — HEMOGLOBIN A1C: Hgb A1c MFr Bld: 7.2 % — ABNORMAL HIGH (ref 4.6–6.5)

## 2020-03-23 NOTE — Progress Notes (Signed)
Subjective:    Patient ID: [name], male    DOB: 11-22-1950, 69 y.o.   MRN: [id number]  HPI   Patient is a 69 year old male who presents today for routine follow-up.  COPD- has an upcoming appointment with a new pulmonologist. Reports that his breathing has been stable.  Reports that he continues nocturnal oxygen.  DM2- Reports that his sugars have been 96-127.  Lab Results  Component Value Date   HGBA1C 7.4 (H) 01/06/2020   HGBA1C 7.5 (H) 10/01/2019   HGBA1C 7.5 (H) 06/22/2019   Lab Results  Component Value Date   MICROALBUR <0.7 09/08/2018   LDLCALC 66 06/22/2019   CREATININE 0.76 01/06/2020   Hyperlipidemia- on crestor. Denies myalgia.   Lab Results  Component Value Date   CHOL 124 06/22/2019   HDL 43.60 06/22/2019   LDLCALC 66 06/22/2019   LDLDIRECT 177.6 10/07/2007   TRIG 72.0 06/22/2019   CHOLHDL 3 06/22/2019      Review of Systems See HPI  Past Medical History:  Diagnosis Date  . Abnormal myocardial perfusion study 2006   EF 44% ? inferoseptal ischemia. no cath done  . Breast mass, right 03/2008  . Contact dermatitis 01/31/2013  . COPD (chronic obstructive pulmonary disease) ([place])    "dx'd 12/2017"  . Coronary artery disease   . Heart murmur   . History of colonic polyps   . Hyperlipidemia   . Hypertension   . Low back pain   . Oxygen deficiency    uses 2L at night  . Type II diabetes mellitus ([place])      Social History   Socioeconomic History  . Marital status: Married    Spouse name: Not on file  . Number of children: Not on file  . Years of education: Not on file  . Highest education level: Not on file  Occupational History  . Not on file  Tobacco Use  . Smoking status: Former Smoker    Packs/day: 1.50    Years: 41.00    Pack years: 61.50    Types: Cigarettes    Quit date: 08/26/2010    Years since quitting: 9.5  . Smokeless tobacco: Never Used  . Tobacco comment: Quit 2012  Vaping Use  . Vaping Use: Never used    Substance and Sexual Activity  . Alcohol use: Yes    Comment: occasionally  . Drug use: Not Currently  . Sexual activity: Not Currently  Other Topics Concern  . 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 Difficulty of Paying Living Expenses: Not hard at all  Food Insecurity: No Food Insecurity  . Worried About Charity fundraiser in the Last Year: Never true  . Ran Out of Food in the Last Year: Never true  Transportation Needs: No Transportation Needs  . Lack of Transportation (Medical): No  . Lack of Transportation (Non-Medical): No  Physical Activity:   . Days of Exercise per Week:   . Minutes of Exercise per Session:   Stress:   . Feeling of Stress :   Social Connections:   . Frequency of Communication with Friends and Family:   . Frequency of Social Gatherings  with Friends and Family:   . Attends Religious Services:   . Active Member of Clubs or Organizations:   . Attends Archivist Meetings:   Marland Kitchen Marital Status:   Intimate Partner Violence:   . Fear of Current or Ex-Partner:   . Emotionally Abused:   Marland Kitchen Physically Abused:   . Sexually Abused:     Past Surgical History:  Procedure Laterality Date  . ABDOMINAL AORTOGRAM N/A 01/13/2018   Procedure: ABDOMINAL AORTOGRAM;  Surgeon: Troy Sine, MD;  Location: [facility];  Service: Cardiovascular;  Laterality: N/A;  . AORTIC VALVE REPLACEMENT N/A 03/04/2018   Procedure: AORTIC VALVE REPLACEMENT (AVR) 76mm Edwards Magna Ease Tissue Valve.;  Surgeon: Melrose Nakayama, MD;  Location: [facility];  Service: Open Heart Surgery;  Laterality: N/A;  . CARDIAC VALVE REPLACEMENT    . COLONOSCOPY W/ POLYPECTOMY    . COLONOSCOPY WITH PROPOFOL N/A 02/22/2020   Procedure: COLONOSCOPY WITH PROPOFOL;  Surgeon: Doran Stabler, MD;  Location: [facility];  Service: Gastroenterology;  Laterality: N/A;  . CORONARY ARTERY BYPASS GRAFT N/A 03/04/2018   Procedure: CORONARY ARTERY BYPASS GRAFTING (CABG) x1:  LIMA to LAD.;  Surgeon: Melrose Nakayama, MD;  Location: [facility];  Service: Open Heart Surgery;  Laterality: N/A;  . HEMOSTASIS CLIP PLACEMENT  02/22/2020   Procedure: HEMOSTASIS CLIP PLACEMENT;  Surgeon: Doran Stabler, MD;  Location: [facility];  Service: Gastroenterology;;  . LIPOMA EXCISION Left 07/2008    "back of shoulder" Dr Gershon Crane  . POLYPECTOMY  02/22/2020   Procedure: POLYPECTOMY;  Surgeon: Doran Stabler, MD;  Location: [facility];  Service: Gastroenterology;;  . RIGHT/LEFT HEART CATH AND CORONARY ANGIOGRAPHY N/A 01/13/2018   Procedure: RIGHT/LEFT HEART CATH AND CORONARY ANGIOGRAPHY;  Surgeon: Troy Sine, MD;  Location: [facility];  Service: Cardiovascular;  Laterality: N/A;  . TEE WITHOUT CARDIOVERSION N/A 03/04/2018   Procedure: TRANSESOPHAGEAL ECHOCARDIOGRAM (TEE);  Surgeon: Melrose Nakayama, MD;  Location: [facility];  Service: Open Heart Surgery;  Laterality: N/A;  . TONSILLECTOMY    . WISDOM TOOTH EXTRACTION      Family History  Problem Relation Age of Onset  . Melanoma Father 59       deceased secondary to melanoma  . Alzheimer's disease Father   . Diabetes Father   . Hypertension Mother        alive -20  . Colon cancer Neg Hx   . Esophageal cancer Neg Hx   . Colon polyps Neg Hx   . Stomach cancer Neg Hx     Allergies  Allergen Reactions  . Amlodipine Besylate Other (See Comments)    REACTION: ? caused left axillary nodules/chest flutter  . Hydrochlorothiazide Hives  . Lisinopril     Hyperkalemia     Current Outpatient Medications on File Prior to Visit  Medication Sig Dispense Refill  . aspirin EC 325 MG EC tablet Take 1 tablet (325 mg total) by mouth daily.  0  . cetirizine (ZYRTEC) 10 MG tablet Take 10 mg by mouth daily as needed for  allergies.    . Cyanocobalamin (VITAMIN B-12 PO) Take 1 tablet by mouth daily.    . dapagliflozin propanediol (FARXIGA) 5 MG TABS tablet Take 5 mg by mouth daily. 30 tablet 5  . furosemide (LASIX) 20 MG tablet TAKE 1 TABLET BY MOUTH EVERY DAY (Patient taking differently: Take 20 mg by mouth daily. ) 90 tablet 1  . glucose blood (PRODIGY  NO CODING BLOOD GLUC) test strip Use as instructed to check blood sugar twice a day.  DX  E11.40 100 each 1  . hydrALAZINE (APRESOLINE) 25 MG tablet TAKE 1 TABLET BY MOUTH THREE TIMES A DAY (Patient taking differently: Take 25 mg by mouth 3 (three) times daily. ) 270 tablet 1  . insulin aspart (NOVOLOG FLEXPEN) 100 UNIT/ML FlexPen INJECT 11-15 UNITS SUBCUTANEOUSLY DAILY BEFORE SUPPER 15 mL 2  . insulin detemir (LEVEMIR FLEXTOUCH) 100 UNIT/ML FlexPen INJECT 50 UNITS INTO THE SKIN DAILY AT 10 PM. (Patient taking differently: Inject 50 Units into the skin daily. INJECT 50 UNITS INTO THE SKIN DAILY) 15 mL 3  . Insulin Pen Needle (B-D ULTRAFINE III SHORT PEN) 31G X 8 MM MISC USE AS DIRECTED 100 each 5  . metFORMIN (GLUCOPHAGE) 1000 MG tablet Take 1 tablet (1,000 mg total) by mouth 2 (two) times daily with a meal. 180 tablet 1  . metoprolol tartrate (LOPRESSOR) 50 MG tablet Take 1 tablet (50 mg total) by mouth 2 (two) times daily. 180 tablet 1  . rosuvastatin (CRESTOR) 20 MG tablet TAKE 1 TABLET BY MOUTH EVERY DAY 90 tablet 1  . tadalafil (CIALIS) 20 MG tablet Take 1 tablet (20 mg total) by mouth daily as needed for erectile dysfunction. 10 tablet 5   No current facility-administered medications on file prior to visit.    BP (!) 141/80 (BP Location: Right Arm, Patient Position: Sitting, Cuff Size: Small)   Pulse 80   Temp 98.1 F (36.7 C) (Oral)   Resp 16   Ht 6\' 4"  (1.93 m)   Wt 169 lb (76.7 kg)   SpO2 97%   BMI 20.57 kg/m       Objective:   Physical Exam Constitutional:      General: He is not in acute distress.    Appearance: He is well-developed.    HENT:     Head: Normocephalic and atraumatic.  Cardiovascular:     Rate and Rhythm: Normal rate and regular rhythm.     Heart sounds: No murmur heard.   Pulmonary:     Effort: Pulmonary effort is normal. No respiratory distress.     Breath sounds: Normal breath sounds. No wheezing or rales.  Skin:    General: Skin is warm and dry.  Neurological:     Mental Status: He is alert and oriented to person, place, and time.  Psychiatric:        Behavior: Behavior normal.        Thought Content: Thought content normal.           Assessment & Plan:  Diabetes type 2-clinically stable.  Continue current meds.  Obtain follow-up A1c.  Hyperlipidemia-tolerating statin.  Obtain follow-up lipid panel.  COPD-stable, continues  Hypertension-blood pressure stable.  Continue current meds.   This visit occurred during the SARS-CoV-2 public health emergency.  Safety protocols were in place, including screening questions prior to the visit, additional usage of staff PPE, and extensive cleaning of exam room while observing appropriate contact time as indicated for disinfecting solutions.

## 2020-03-23 NOTE — Patient Instructions (Signed)
[name] , Thank you for taking time to come for your Medicare Wellness Visit. I appreciate your ongoing commitment to your health goals. Please review the following plan we discussed and let me know if I can assist you in the future.   Screening recommendations/referrals: Colorectal cancer screening: Completed 03/03/20. Repeat every 3 years Recommended yearly ophthalmology/optometry visit for glaucoma screening and checkup Recommended yearly dental visit for hygiene and checkup  Vaccinations: Flu Vaccine status: Up to date Pneumococcal vaccine status: Up to date Covid-19 vaccine status: Completed vaccines  Advanced directives: Bring a copy of your living will and/or healthcare power of attorney to your next office visit.  Next appointment: Follow up in one year for your annual wellness visit    Preventive Care 65 Years and Older, Male Preventive care refers to lifestyle choices and visits with your health care provider that can promote health and wellness. This includes:  A yearly physical exam. This is also called an annual well check.  Regular dental and eye exams.  Immunizations.  Screening for certain conditions.  Healthy lifestyle choices, such as diet and exercise. What can I expect for my preventive care visit? Physical exam Your health care provider will check:  Height and weight. These may be used to calculate body mass index (BMI), which is a measurement that tells if you are at a healthy weight.  Heart rate and blood pressure.  Your skin for abnormal spots. Counseling Your health care provider may ask you questions about:  Alcohol, tobacco, and drug use.  Emotional well-being.  Home and relationship well-being.  Sexual activity.  Eating habits.  History of falls.  Memory and ability to understand (cognition).  Work and work Statistician. What immunizations do I need?  Influenza (flu) vaccine  This is recommended every year. Tetanus,  diphtheria, and pertussis (Tdap) vaccine  You may need a Td booster every 10 years. Varicella (chickenpox) vaccine  You may need this vaccine if you have not already been vaccinated. Zoster (shingles) vaccine  You may need this after age 30. Pneumococcal conjugate (PCV13) vaccine  One dose is recommended after age 77. Pneumococcal polysaccharide (PPSV23) vaccine  One dose is recommended after age 28. Measles, mumps, and rubella (MMR) vaccine  You may need at least one dose of MMR if you were born in 1957 or later. You may also need a second dose. Meningococcal conjugate (MenACWY) vaccine  You may need this if you have certain conditions. Hepatitis A vaccine  You may need this if you have certain conditions or if you travel or work in places where you may be exposed to hepatitis A. Hepatitis B vaccine  You may need this if you have certain conditions or if you travel or work in places where you may be exposed to hepatitis B. Haemophilus influenzae type b (Hib) vaccine  You may need this if you have certain conditions. You may receive vaccines as individual doses or as more than one vaccine together in one shot (combination vaccines). Talk with your health care provider about the risks and benefits of combination vaccines. What tests do I need? Blood tests  Lipid and cholesterol levels. These may be checked every 5 years, or more frequently depending on your overall health.  Hepatitis C test.  Hepatitis B test. Screening  Lung cancer screening. You may have this screening every year starting at age 33 if you have a 30-pack-year history of smoking and currently smoke or have quit within the past 15 years.  Colorectal  cancer screening. All adults should have this screening starting at age 48 and continuing until age 50. Your health care provider may recommend screening at age 85 if you are at increased risk. You will have tests every 1-10 years, depending on your results and  the type of screening test.  Prostate cancer screening. Recommendations will vary depending on your family history and other risks.  Diabetes screening. This is done by checking your blood sugar (glucose) after you have not eaten for a while (fasting). You may have this done every 1-3 years.  Abdominal aortic aneurysm (AAA) screening. You may need this if you are a current or former smoker.  Sexually transmitted disease (STD) testing. Follow these instructions at home: Eating and drinking  Eat a diet that includes fresh fruits and vegetables, whole grains, lean protein, and low-fat dairy products. Limit your intake of foods with high amounts of sugar, saturated fats, and salt.  Take vitamin and mineral supplements as recommended by your health care provider.  Do not drink alcohol if your health care provider tells you not to drink.  If you drink alcohol: ? Limit how much you have to 0-2 drinks a day. ? Be aware of how much alcohol is in your drink. In the [place] one drink equals one 12 oz bottle of beer (355 mL), one 5 oz glass of wine (148 mL), or one 1 oz glass of hard liquor (44 mL). Lifestyle  Take daily care of your teeth and gums.  Stay active. Exercise for at least 30 minutes on 5 or more days each week.  Do not use any products that contain nicotine or tobacco, such as cigarettes, e-cigarettes, and chewing tobacco. If you need help quitting, ask your health care provider.  If you are sexually active, practice safe sex. Use a condom or other form of protection to prevent STIs (sexually transmitted infections).  Talk with your health care provider about taking a low-dose aspirin or statin. What's next?  Visit your health care provider once a year for a well check visit.  Ask your health care provider how often you should have your eyes and teeth checked.  Stay up to date on all vaccines. This information is not intended to replace advice given to you by your health care  provider. Make sure you discuss any questions you have with your health care provider. Document Revised: 08/06/2018 Document Reviewed: 08/06/2018 Elsevier Patient Education  2020 [employer].

## 2020-03-23 NOTE — Patient Instructions (Signed)
Please complete lab work prior to leaving.   

## 2020-03-24 ENCOUNTER — Encounter: Payer: Self-pay | Admitting: Family

## 2020-03-26 ENCOUNTER — Emergency Department (HOSPITAL_COMMUNITY): Payer: Medicare Other

## 2020-03-26 ENCOUNTER — Observation Stay (HOSPITAL_COMMUNITY): Payer: Medicare Other

## 2020-03-26 ENCOUNTER — Encounter (HOSPITAL_COMMUNITY): Payer: Self-pay

## 2020-03-26 ENCOUNTER — Other Ambulatory Visit: Payer: Self-pay

## 2020-03-26 ENCOUNTER — Inpatient Hospital Stay (HOSPITAL_COMMUNITY)
Admission: EM | Admit: 2020-03-26 | Discharge: 2020-03-28 | DRG: 637 | Disposition: A | Discharge status: Home Health | Payer: Medicare Other | Attending: Internal Medicine | Admitting: Internal Medicine

## 2020-03-26 DIAGNOSIS — I110 Hypertensive heart disease with heart failure: Secondary | ICD-10-CM | POA: Diagnosis present

## 2020-03-26 DIAGNOSIS — R569 Unspecified convulsions: Secondary | ICD-10-CM | POA: Diagnosis not present

## 2020-03-26 DIAGNOSIS — J439 Emphysema, unspecified: Secondary | ICD-10-CM | POA: Diagnosis not present

## 2020-03-26 DIAGNOSIS — E11649 Type 2 diabetes mellitus with hypoglycemia without coma: Secondary | ICD-10-CM | POA: Diagnosis not present

## 2020-03-26 DIAGNOSIS — Z952 Presence of prosthetic heart valve: Secondary | ICD-10-CM

## 2020-03-26 DIAGNOSIS — I5022 Chronic systolic (congestive) heart failure: Secondary | ICD-10-CM | POA: Diagnosis present

## 2020-03-26 DIAGNOSIS — F1290 Cannabis use, unspecified, uncomplicated: Secondary | ICD-10-CM | POA: Diagnosis present

## 2020-03-26 DIAGNOSIS — T383X5A Adverse effect of insulin and oral hypoglycemic [antidiabetic] drugs, initial encounter: Secondary | ICD-10-CM | POA: Diagnosis present

## 2020-03-26 DIAGNOSIS — Z794 Long term (current) use of insulin: Secondary | ICD-10-CM

## 2020-03-26 DIAGNOSIS — R4182 Altered mental status, unspecified: Secondary | ICD-10-CM | POA: Diagnosis not present

## 2020-03-26 DIAGNOSIS — E785 Hyperlipidemia, unspecified: Secondary | ICD-10-CM | POA: Diagnosis not present

## 2020-03-26 DIAGNOSIS — Z20822 Contact with and (suspected) exposure to covid-19: Secondary | ICD-10-CM | POA: Diagnosis present

## 2020-03-26 DIAGNOSIS — Z9981 Dependence on supplemental oxygen: Secondary | ICD-10-CM

## 2020-03-26 DIAGNOSIS — Z8719 Personal history of other diseases of the digestive system: Secondary | ICD-10-CM

## 2020-03-26 DIAGNOSIS — Z808 Family history of malignant neoplasm of other organs or systems: Secondary | ICD-10-CM

## 2020-03-26 DIAGNOSIS — R413 Other amnesia: Secondary | ICD-10-CM | POA: Diagnosis not present

## 2020-03-26 DIAGNOSIS — E161 Other hypoglycemia: Secondary | ICD-10-CM | POA: Diagnosis not present

## 2020-03-26 DIAGNOSIS — I2510 Atherosclerotic heart disease of native coronary artery without angina pectoris: Secondary | ICD-10-CM | POA: Diagnosis present

## 2020-03-26 DIAGNOSIS — I429 Cardiomyopathy, unspecified: Secondary | ICD-10-CM | POA: Diagnosis not present

## 2020-03-26 DIAGNOSIS — G9341 Metabolic encephalopathy: Secondary | ICD-10-CM | POA: Diagnosis not present

## 2020-03-26 DIAGNOSIS — D170 Benign lipomatous neoplasm of skin and subcutaneous tissue of head, face and neck: Secondary | ICD-10-CM | POA: Diagnosis not present

## 2020-03-26 DIAGNOSIS — Z833 Family history of diabetes mellitus: Secondary | ICD-10-CM

## 2020-03-26 DIAGNOSIS — J811 Chronic pulmonary edema: Secondary | ICD-10-CM | POA: Diagnosis not present

## 2020-03-26 DIAGNOSIS — I6782 Cerebral ischemia: Secondary | ICD-10-CM | POA: Diagnosis not present

## 2020-03-26 DIAGNOSIS — Z951 Presence of aortocoronary bypass graft: Secondary | ICD-10-CM

## 2020-03-26 DIAGNOSIS — E162 Hypoglycemia, unspecified: Secondary | ICD-10-CM

## 2020-03-26 DIAGNOSIS — Z8249 Family history of ischemic heart disease and other diseases of the circulatory system: Secondary | ICD-10-CM

## 2020-03-26 DIAGNOSIS — I10 Essential (primary) hypertension: Secondary | ICD-10-CM | POA: Diagnosis not present

## 2020-03-26 DIAGNOSIS — G9389 Other specified disorders of brain: Secondary | ICD-10-CM | POA: Diagnosis not present

## 2020-03-26 DIAGNOSIS — S0990XA Unspecified injury of head, initial encounter: Secondary | ICD-10-CM | POA: Diagnosis not present

## 2020-03-26 DIAGNOSIS — R404 Transient alteration of awareness: Secondary | ICD-10-CM | POA: Diagnosis not present

## 2020-03-26 DIAGNOSIS — R456 Violent behavior: Secondary | ICD-10-CM | POA: Diagnosis not present

## 2020-03-26 DIAGNOSIS — E160 Drug-induced hypoglycemia without coma: Secondary | ICD-10-CM

## 2020-03-26 DIAGNOSIS — G9340 Encephalopathy, unspecified: Secondary | ICD-10-CM | POA: Diagnosis not present

## 2020-03-26 DIAGNOSIS — I447 Left bundle-branch block, unspecified: Secondary | ICD-10-CM | POA: Diagnosis present

## 2020-03-26 DIAGNOSIS — Z820 Family history of epilepsy and other diseases of the nervous system: Secondary | ICD-10-CM

## 2020-03-26 DIAGNOSIS — Z888 Allergy status to other drugs, medicaments and biological substances status: Secondary | ICD-10-CM

## 2020-03-26 DIAGNOSIS — R0989 Other specified symptoms and signs involving the circulatory and respiratory systems: Secondary | ICD-10-CM | POA: Diagnosis present

## 2020-03-26 DIAGNOSIS — R410 Disorientation, unspecified: Secondary | ICD-10-CM | POA: Diagnosis not present

## 2020-03-26 DIAGNOSIS — J449 Chronic obstructive pulmonary disease, unspecified: Secondary | ICD-10-CM | POA: Diagnosis present

## 2020-03-26 DIAGNOSIS — Z87891 Personal history of nicotine dependence: Secondary | ICD-10-CM

## 2020-03-26 DIAGNOSIS — R0902 Hypoxemia: Secondary | ICD-10-CM | POA: Diagnosis present

## 2020-03-26 DIAGNOSIS — Z7982 Long term (current) use of aspirin: Secondary | ICD-10-CM

## 2020-03-26 DIAGNOSIS — D1779 Benign lipomatous neoplasm of other sites: Secondary | ICD-10-CM | POA: Diagnosis not present

## 2020-03-26 DIAGNOSIS — Z79899 Other long term (current) drug therapy: Secondary | ICD-10-CM

## 2020-03-26 HISTORY — DX: Adverse effect of insulin and oral hypoglycemic (antidiabetic) drugs, initial encounter: T38.3X5A

## 2020-03-26 HISTORY — DX: Adverse effect of insulin and oral hypoglycemic (antidiabetic) drugs, initial encounter: E16.0

## 2020-03-26 LAB — URINALYSIS, ROUTINE W REFLEX MICROSCOPIC
Bacteria, UA: NONE SEEN
Bilirubin Urine: NEGATIVE
Glucose, UA: >=500 mg/dL — AB
Hgb urine dipstick: NEGATIVE
Ketones, ur: NEGATIVE mg/dL
Leukocytes,Ua: NEGATIVE
Nitrite: NEGATIVE
Protein, ur: 30 mg/dL — AB
Specific Gravity, Urine: 1.027 (ref 1.005–1.030)
pH: 7 (ref 5.0–8.0)

## 2020-03-26 LAB — I-STAT VENOUS BLOOD GAS, ED
Acid-Base Excess: 3 mmol/L — ABNORMAL HIGH (ref 0.0–2.0)
Bicarbonate: 28 mmol/L (ref 20.0–28.0)
Calcium, Ion: 1.07 mmol/L — ABNORMAL LOW (ref 1.15–1.40)
HCT: 53 % — ABNORMAL HIGH (ref 39.0–52.0)
Hemoglobin: 18 g/dL — ABNORMAL HIGH (ref 13.0–17.0)
O2 Saturation: 43 %
Potassium: 4.4 mmol/L (ref 3.5–5.1)
Sodium: 140 mmol/L (ref 135–145)
TCO2: 29 mmol/L (ref 22–32)
pCO2, Ven: 42.5 mmHg — ABNORMAL LOW (ref 44.0–60.0)
pH, Ven: 7.427 (ref 7.250–7.430)
pO2, Ven: 24 mmHg — CL (ref 32.0–45.0)

## 2020-03-26 LAB — CBG MONITORING, ED
Glucose-Capillary: 71 mg/dL (ref 70–99)
Glucose-Capillary: 79 mg/dL (ref 70–99)
Glucose-Capillary: 80 mg/dL (ref 70–99)
Glucose-Capillary: 137 mg/dL — ABNORMAL HIGH (ref 70–99)

## 2020-03-26 LAB — CORTISOL-PM, BLOOD: Cortisol - PM: 5.8 ug/dL (ref <10.0)

## 2020-03-26 LAB — RAPID URINE DRUG SCREEN, HOSP PERFORMED
Amphetamines: NOT DETECTED
Barbiturates: NOT DETECTED
Benzodiazepines: NOT DETECTED
Cocaine: NOT DETECTED
Opiates: NOT DETECTED
Tetrahydrocannabinol: POSITIVE — AB

## 2020-03-26 LAB — CBC
HCT: 54.9 % — ABNORMAL HIGH (ref 39.0–52.0)
Hemoglobin: 16.1 g/dL (ref 13.0–17.0)
MCH: 22.2 pg — ABNORMAL LOW (ref 26.0–34.0)
MCHC: 29.3 g/dL — ABNORMAL LOW (ref 30.0–36.0)
MCV: 75.7 fL — ABNORMAL LOW (ref 80.0–100.0)
Platelets: 478 10*3/uL — ABNORMAL HIGH (ref 150–400)
RBC: 7.25 MIL/uL — ABNORMAL HIGH (ref 4.22–5.81)
RDW: 18 % — ABNORMAL HIGH (ref 11.5–15.5)
WBC: 10.5 10*3/uL (ref 4.0–10.5)
nRBC: 0 % (ref 0.0–0.2)

## 2020-03-26 LAB — HIV ANTIBODY (ROUTINE TESTING W REFLEX): HIV Screen 4th Generation wRfx: NONREACTIVE

## 2020-03-26 LAB — COMPREHENSIVE METABOLIC PANEL
ALT: 13 U/L (ref 0–44)
AST: 22 U/L (ref 15–41)
Albumin: 4.2 g/dL (ref 3.5–5.0)
Alkaline Phosphatase: 46 U/L (ref 38–126)
Anion gap: 12 (ref 5–15)
BUN: 9 mg/dL (ref 8–23)
CO2: 26 mmol/L (ref 22–32)
Calcium: 9.4 mg/dL (ref 8.9–10.3)
Chloride: 104 mmol/L (ref 98–111)
Creatinine, Ser: 0.84 mg/dL (ref 0.61–1.24)
GFR calc Af Amer: >60 mL/min (ref >60)
GFR calc non Af Amer: >60 mL/min (ref >60)
Glucose, Bld: 66 mg/dL — ABNORMAL LOW (ref 70–99)
Potassium: 3.5 mmol/L (ref 3.5–5.1)
Sodium: 142 mmol/L (ref 135–145)
Total Bilirubin: 0.6 mg/dL (ref 0.3–1.2)
Total Protein: 7.5 g/dL (ref 6.5–8.1)

## 2020-03-26 LAB — TSH: TSH: 0.574 u[IU]/mL (ref 0.350–4.500)

## 2020-03-26 LAB — TROPONIN I (HIGH SENSITIVITY)
Troponin I (High Sensitivity): 12 ng/L (ref <18)
Troponin I (High Sensitivity): 12 ng/L (ref <18)

## 2020-03-26 LAB — GLUCOSE, CAPILLARY: Glucose-Capillary: 131 mg/dL — ABNORMAL HIGH (ref 70–99)

## 2020-03-26 LAB — AMMONIA: Ammonia: 11 umol/L (ref 9–35)

## 2020-03-26 LAB — ETHANOL: Alcohol, Ethyl (B): <10 mg/dL (ref <10)

## 2020-03-26 LAB — LIPASE, BLOOD: Lipase: 62 U/L — ABNORMAL HIGH (ref 11–51)

## 2020-03-26 LAB — SARS CORONAVIRUS 2 BY RT PCR (HOSPITAL ORDER, PERFORMED IN ~~LOC~~ HOSPITAL LAB): SARS Coronavirus 2: NEGATIVE

## 2020-03-26 LAB — CK: Total CK: 85 U/L (ref 49–397)

## 2020-03-26 MED ORDER — LACTATED RINGERS IV SOLN: INTRAVENOUS | Status: DC

## 2020-03-26 MED ORDER — VITAMIN B-12 100 MCG PO TABS
100.0000 ug | ORAL_TABLET | Freq: Every day | ORAL | Status: DC
  Administered 2020-03-26 – 2020-03-28 (×3): 100 ug via ORAL
  Filled 2020-03-26 (×4): qty 1

## 2020-03-26 MED ORDER — ACETAMINOPHEN 325 MG PO TABS
650.0000 mg | ORAL_TABLET | Freq: Once | ORAL | Status: AC
  Administered 2020-03-26: 650 mg via ORAL
  Filled 2020-03-26: qty 2

## 2020-03-26 MED ORDER — STROKE: EARLY STAGES OF RECOVERY BOOK
Freq: Once | Status: DC
  Filled 2020-03-26: qty 1

## 2020-03-26 MED ORDER — ACETAMINOPHEN 325 MG PO TABS
650.0000 mg | ORAL_TABLET | ORAL | Status: DC | PRN
  Administered 2020-03-26 – 2020-03-28 (×3): 650 mg via ORAL
  Filled 2020-03-26 (×3): qty 2

## 2020-03-26 MED ORDER — ENOXAPARIN SODIUM 40 MG/0.4ML ~~LOC~~ SOLN
40.0000 mg | SUBCUTANEOUS | Status: DC
  Administered 2020-03-26 – 2020-03-27 (×2): 40 mg via SUBCUTANEOUS
  Filled 2020-03-26 (×2): qty 0.4

## 2020-03-26 MED ORDER — ASPIRIN EC 325 MG PO TBEC
325.0000 mg | DELAYED_RELEASE_TABLET | Freq: Every day | ORAL | Status: DC
  Administered 2020-03-26 – 2020-03-28 (×3): 325 mg via ORAL
  Filled 2020-03-26 (×3): qty 1

## 2020-03-26 MED ORDER — SENNOSIDES-DOCUSATE SODIUM 8.6-50 MG PO TABS: 1.0000 | ORAL_TABLET | Freq: Every evening | ORAL | Status: DC | PRN

## 2020-03-26 MED ORDER — HYDRALAZINE HCL 25 MG PO TABS
25.0000 mg | ORAL_TABLET | Freq: Three times a day (TID) | ORAL | Status: DC
  Administered 2020-03-26 (×2): 25 mg via ORAL
  Filled 2020-03-26 (×3): qty 1

## 2020-03-26 MED ORDER — FUROSEMIDE 20 MG PO TABS: 20.0000 mg | ORAL_TABLET | Freq: Every day | ORAL | Status: DC

## 2020-03-26 MED ORDER — SODIUM CHLORIDE 0.9% FLUSH: 3.0000 mL | Freq: Once | INTRAVENOUS | Status: DC

## 2020-03-26 MED ORDER — LORATADINE 10 MG PO TABS
10.0000 mg | ORAL_TABLET | Freq: Every day | ORAL | Status: DC
  Administered 2020-03-26 – 2020-03-28 (×3): 10 mg via ORAL
  Filled 2020-03-26 (×3): qty 1

## 2020-03-26 MED ORDER — ACETAMINOPHEN 650 MG RE SUPP: 650.0000 mg | RECTAL | Status: DC | PRN

## 2020-03-26 MED ORDER — FUROSEMIDE 20 MG PO TABS
20.0000 mg | ORAL_TABLET | Freq: Every day | ORAL | Status: DC
  Administered 2020-03-27 – 2020-03-28 (×2): 20 mg via ORAL
  Filled 2020-03-26 (×3): qty 1

## 2020-03-26 MED ORDER — ACETAMINOPHEN 160 MG/5ML PO SOLN: 650.0000 mg | ORAL | Status: DC | PRN

## 2020-03-26 MED ORDER — INSULIN ASPART 100 UNIT/ML ~~LOC~~ SOLN
0.0000 [IU] | Freq: Three times a day (TID) | SUBCUTANEOUS | Status: DC
  Administered 2020-03-27: 2 [IU] via SUBCUTANEOUS
  Administered 2020-03-27 (×2): 1 [IU] via SUBCUTANEOUS
  Administered 2020-03-28: 2 [IU] via SUBCUTANEOUS
  Administered 2020-03-28 (×2): 1 [IU] via SUBCUTANEOUS

## 2020-03-26 MED ORDER — ROSUVASTATIN CALCIUM 20 MG PO TABS
20.0000 mg | ORAL_TABLET | Freq: Every day | ORAL | Status: DC
  Administered 2020-03-26 – 2020-03-28 (×3): 20 mg via ORAL
  Filled 2020-03-26 (×3): qty 1

## 2020-03-26 MED ORDER — DEXTROSE-NACL 5-0.9 % IV SOLN: INTRAVENOUS | Status: DC

## 2020-03-26 MED ORDER — METOPROLOL TARTRATE 50 MG PO TABS
50.0000 mg | ORAL_TABLET | Freq: Two times a day (BID) | ORAL | Status: DC
  Administered 2020-03-26 – 2020-03-28 (×5): 50 mg via ORAL
  Filled 2020-03-26: qty 1
  Filled 2020-03-26: qty 2
  Filled 2020-03-26 (×3): qty 1

## 2020-03-26 NOTE — ED Provider Notes (Signed)
[facility] EMERGENCY DEPARTMENT Provider Note   CSN: [id number] Arrival date & time: 03/26/20  0913     History Chief Complaint  Patient presents with  . Hypoglycemia    [name] is a 69 y.o. male.  HPI Patient has history of diabetes.  At baseline, he is active and has good recall for all events.  Patient is married and has children and grandchildren.  Patient's wife reports that she typically goes to bed earlier than he does.  They had dinner together yesterday.  They ate around 4:30 in the afternoon.  She went to bed about 830 and he was well at that time.  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  No apparent incidences no falls no injuries.  Patient's wife reports she typically awakens much earlier than he does.  She heard him at about 6:30 in the morning thrashing and struggling about.  She reports it was very unusual, she went in talk to him and tried to calm him down.  He was agitated but ultimately calmed down and seemed to rest again.  She reports a while later she heard the same thing and went in and was shaking him and trying to communicate with him, she reports that he looked like he did not recognize her and had a very agitated and faraway look in his eyes.  She reports he was combative.  She had never seen him like this.  She reports she did not check his blood sugar because he always does all of his own medication management.  She called EMS and on their arrival CBG was 40.  Patient was given an amp of D50.  He improved significantly.  He was no longer combative.  Patient brought to the emergency department.  Patient does not have any recall for what he did yesterday.  He is very vague in answering questions.  He cannot recall the names of his children and the correct number of grandchildren.  He cannot name the president and does not have recall for any previous presidents.  He was able to name the month  although not the year.  Patient's wife reports this is very atypical.  At baseline he has normal recall and activity level.  She denies any incremental observation of slight memory losses or behavior changes to suggest dementia.    Past Medical History:  Diagnosis Date  . Abnormal myocardial perfusion study 2006   EF 44% ? inferoseptal ischemia. no cath done  . Breast mass, right 03/2008  . Contact dermatitis 01/31/2013  . COPD (chronic obstructive pulmonary disease) ([place])    "dx'd 12/2017"  . Coronary artery disease   . Heart murmur   . History of colonic polyps   . Hyperlipidemia   . Hypertension   . Low back pain   . Oxygen deficiency    uses 2L at night  . Type II diabetes mellitus [facility])     Patient Active Problem List   Diagnosis Date Noted  . Hypoglycemia due to insulin 03/26/2020  . Hypoglycemia 03/26/2020  . Benign neoplasm of cecum   . Paroxysmal atrial fibrillation ([place]) 10/12/2018  . Former cigarette smoker 10/12/2018  . COPD GOLD II A  08/25/2018  . Centrilobular emphysema ([place]) 08/25/2018  . Nocturnal hypoxemia 08/25/2018  . Cardiomyopathy ([place]) 07/20/2018  . Status post coronary artery bypass graft 03/20/2018  . S/P AVR 03/04/2018  . Coronary artery disease 02/12/2018  .  Critical aortic valve stenosis 01/12/2018  . Dyspnea on exertion 01/12/2018  . Preventative health care 03/02/2015  . ERECTILE DYSFUNCTION, ORGANIC 02/10/2009  . MYOCARDIAL PERFUSION SCAN, WITH STRESS TEST, ABNORMAL 08/11/2008  . Type 2 diabetes mellitus, uncontrolled, with neuropathy ([place]) 05/20/2008  . Hyperlipemia 10/21/2007  . HTN (hypertension) 10/21/2007  . HEMORRHOIDS, INTERNAL 03/18/2007  . DEGENERATIVE JOINT DISEASE, BACK 03/18/2007  . COLONIC POLYPS, HX OF 03/18/2007    Past Surgical History:  Procedure Laterality Date  . ABDOMINAL AORTOGRAM N/A 01/13/2018   Procedure: ABDOMINAL AORTOGRAM;  Surgeon: Troy Sine, MD;  Location: [facility] CV LAB;  Service: Cardiovascular;   Laterality: N/A;  . AORTIC VALVE REPLACEMENT N/A 03/04/2018   Procedure: AORTIC VALVE REPLACEMENT (AVR) 39mm Edwards Magna Ease Tissue Valve.;  Surgeon: Melrose Nakayama, MD;  Location: [facility];  Service: Open Heart Surgery;  Laterality: N/A;  . CARDIAC VALVE REPLACEMENT    . COLONOSCOPY W/ POLYPECTOMY    . COLONOSCOPY WITH PROPOFOL N/A 02/22/2020   Procedure: COLONOSCOPY WITH PROPOFOL;  Surgeon: Doran Stabler, MD;  Location: [facility];  Service: Gastroenterology;  Laterality: N/A;  . CORONARY ARTERY BYPASS GRAFT N/A 03/04/2018   Procedure: CORONARY ARTERY BYPASS GRAFTING (CABG) x1:  LIMA to LAD.;  Surgeon: Melrose Nakayama, MD;  Location: [facility];  Service: Open Heart Surgery;  Laterality: N/A;  . HEMOSTASIS CLIP PLACEMENT  02/22/2020   Procedure: HEMOSTASIS CLIP PLACEMENT;  Surgeon: Doran Stabler, MD;  Location: [facility];  Service: Gastroenterology;;  . LIPOMA EXCISION Left 07/2008    "back of shoulder" Dr Gershon Crane  . POLYPECTOMY  02/22/2020   Procedure: POLYPECTOMY;  Surgeon: Doran Stabler, MD;  Location: [facility];  Service: Gastroenterology;;  . RIGHT/LEFT HEART CATH AND CORONARY ANGIOGRAPHY N/A 01/13/2018   Procedure: RIGHT/LEFT HEART CATH AND CORONARY ANGIOGRAPHY;  Surgeon: Troy Sine, MD;  Location: [facility] CV [facility];  Service: Cardiovascular;  Laterality: N/A;  . TEE WITHOUT CARDIOVERSION N/A 03/04/2018   Procedure: TRANSESOPHAGEAL ECHOCARDIOGRAM (TEE);  Surgeon: Melrose Nakayama, MD;  Location: [facility];  Service: Open Heart Surgery;  Laterality: N/A;  . TONSILLECTOMY    . WISDOM TOOTH EXTRACTION         Family History  Problem Relation Age of Onset  . Melanoma Father 38       deceased secondary to melanoma  . Alzheimer's disease Father   . Diabetes Father   . Hypertension Mother        alive -84  . Colon cancer Neg Hx   . Esophageal cancer Neg Hx   . Colon polyps Neg Hx   . Stomach cancer Neg Hx     Social History   Tobacco Use  .  Smoking status: Former Smoker    Packs/day: 1.50    Years: 41.00    Pack years: 61.50    Types: Cigarettes    Quit date: 08/26/2010    Years since quitting: 9.5  . Smokeless tobacco: Never Used  . Tobacco comment: Quit 2012  Vaping Use  . Vaping Use: Never used  Substance Use Topics  . Alcohol use: Yes    Comment: occasionally  . Drug use: Not Currently    Home Medications Prior to Admission medications   Medication Sig Start Date End Date Taking? Authorizing Provider  aspirin EC 325 MG EC tablet Take 1 tablet (325 mg total) by mouth daily. 03/11/18   Gold, Wayne E, PA-C  cetirizine (ZYRTEC) 10 MG tablet Take 10  mg by mouth daily as needed for allergies.    [provider]  Cyanocobalamin (VITAMIN B-12 PO) Take 1 tablet by mouth daily.    [provider]  dapagliflozin propanediol (FARXIGA) 5 MG TABS tablet Take 5 mg by mouth daily. 12/31/19   Debbrah Alar, NP  furosemide (LASIX) 20 MG tablet TAKE 1 TABLET BY MOUTH EVERY DAY Patient taking differently: Take 20 mg by mouth daily.  02/07/20   Park Liter, MD  glucose blood (PRODIGY NO CODING BLOOD GLUC) test strip Use as instructed to check blood sugar twice a day.  DX  E11.40 06/05/16   Debbrah Alar, NP  hydrALAZINE (APRESOLINE) 25 MG tablet TAKE 1 TABLET BY MOUTH THREE TIMES A DAY Patient taking differently: Take 25 mg by mouth 3 (three) times daily.  12/21/19   Debbrah Alar, NP  insulin aspart (NOVOLOG FLEXPEN) 100 UNIT/ML FlexPen INJECT 11-15 UNITS SUBCUTANEOUSLY DAILY BEFORE SUPPER 03/20/20   Debbrah Alar, NP  insulin detemir (LEVEMIR FLEXTOUCH) 100 UNIT/ML FlexPen INJECT 50 UNITS INTO THE SKIN DAILY AT 10 PM. Patient taking differently: Inject 50 Units into the skin daily. INJECT 50 UNITS INTO THE SKIN DAILY 12/31/19   Debbrah Alar, NP  Insulin Pen Needle (B-D ULTRAFINE III SHORT PEN) 31G X 8 MM MISC USE AS DIRECTED 11/07/16   Debbrah Alar, NP  metFORMIN (GLUCOPHAGE) 1000  MG tablet Take 1 tablet (1,000 mg total) by mouth 2 (two) times daily with a meal. 12/31/19   Debbrah Alar, NP  metoprolol tartrate (LOPRESSOR) 50 MG tablet Take 1 tablet (50 mg total) by mouth 2 (two) times daily. 03/20/20   Debbrah Alar, NP  rosuvastatin (CRESTOR) 20 MG tablet TAKE 1 TABLET BY MOUTH EVERY DAY 02/14/20   Debbrah Alar, NP  tadalafil (CIALIS) 20 MG tablet Take 1 tablet (20 mg total) by mouth daily as needed for erectile dysfunction. 10/01/19   Debbrah Alar, NP    Allergies    Amlodipine besylate, Hydrochlorothiazide, and Lisinopril  Review of Systems   Review of Systems 10 systems reviewed and negative except as per HPI Physical Exam Updated Vital Signs Temp 99.5 F (37.5 C) (Rectal)   Physical Exam Constitutional:      Comments: Patient is alert.  No respiratory distress no acute distress.  Thin but well-nourished well-developed.  HENT:     Nose: Nose normal.     Mouth/Throat:     Mouth: Mucous membranes are moist.     Pharynx: Oropharynx is clear.  Eyes:     Extraocular Movements: Extraocular movements intact.     Pupils: Pupils are equal, round, and reactive to light.  Neck:     Comments: Paraspinous muscle body tenderness bilaterally. Cardiovascular:     Rate and Rhythm: Normal rate and regular rhythm.  Pulmonary:     Effort: Pulmonary effort is normal.     Breath sounds: Normal breath sounds.  Abdominal:     General: There is no distension.     Palpations: Abdomen is soft.     Tenderness: There is no abdominal tenderness. There is no guarding.  Musculoskeletal:        General: No swelling or tenderness. Normal range of motion.     Cervical back: Neck supple.     Right lower leg: No edema.     Left lower leg: No edema.  Skin:    General: Skin is warm and dry.  Neurological:     Comments: Patient is alert.  His speech is clear.  He  has very poor recall for both short-term and long-term events.  He does have some recall for  pertinent facts such as his address, name of one of his children.  No focal motor deficits.  Strength 5\5 upper and lower extremities.  Psychiatric:        Mood and Affect: Mood normal.     ED Results / Procedures / Treatments   Labs (all labs ordered are listed, but only abnormal results are displayed) Labs Reviewed  LIPASE, BLOOD - Abnormal; Notable for the following components:      Result Value   Lipase 62 (*)    All other components within normal limits  COMPREHENSIVE METABOLIC PANEL - Abnormal; Notable for the following components:   Glucose, Bld 66 (*)    All other components within normal limits  CBC - Abnormal; Notable for the following components:   RBC 7.25 (*)    HCT 54.9 (*)    MCV 75.7 (*)    MCH 22.2 (*)    MCHC 29.3 (*)    RDW 18.0 (*)    Platelets 478 (*)    All other components within normal limits  I-STAT VENOUS BLOOD GAS, ED - Abnormal; Notable for the following components:   pCO2, Ven 42.5 (*)    pO2, Ven 24.0 (*)    Acid-Base Excess 3.0 (*)    Calcium, Ion 1.07 (*)    HCT 53.0 (*)    Hemoglobin 18.0 (*)    All other components within normal limits  SARS CORONAVIRUS 2 BY RT PCR (HOSPITAL ORDER, [facility])  CK  AMMONIA  ETHANOL  URINALYSIS, ROUTINE W REFLEX MICROSCOPIC  BLOOD GAS, VENOUS  RAPID URINE DRUG SCREEN, HOSP PERFORMED  HIV ANTIBODY (ROUTINE TESTING W REFLEX)  TSH  CORTISOL-PM, BLOOD  CBG MONITORING, ED  CBG MONITORING, ED  CBG MONITORING, ED  TROPONIN I (HIGH SENSITIVITY)  TROPONIN I (HIGH SENSITIVITY)    EKG EKG Interpretation  Date/Time:  Sunday March 26 2020 09:20:36 EDT Ventricular Rate:  88 PR Interval:  162 QRS Duration: 144 QT Interval:  402 QTC Calculation: 486 R Axis:   83 Text Interpretation: Normal sinus rhythm Left bundle branch block Abnormal ECG similar ro old with slight increase IVCD Confirmed by Charlesetta Shanks [phone]) on 03/26/2020 9:27:45 AM   Radiology DG Chest 1  View  Result Date: 03/26/2020 CLINICAL DATA:  Altered mental status.  Previous CABG. EXAM: CHEST  1 VIEW COMPARISON:  03/31/2018; chest CT-04/26/2019 FINDINGS: Grossly unchanged cardiac silhouette and mediastinal contours post median sternotomy and CABG. The lungs remain hyperexpanded with flattening the diaphragms and thinning of the biapical pulmonary parenchyma. The pulmonary vasculature appears less distinct than present examination with cephalization of flow. No discrete focal airspace opacities. No pleural effusion, though note, the left costophrenic angle is excluded view. No pneumothorax. No evidence of edema. No acute osseous abnormalities. IMPRESSION: Suspected pulmonary edema superimposed on advanced emphysematous change. Electronically Signed   By: Sandi Mariscal M.D.   On: 03/26/2020 11:10   CT Head Wo Contrast  Result Date: 03/26/2020 CLINICAL DATA:  Altered mental status. EXAM: CT HEAD WITHOUT CONTRAST TECHNIQUE: Contiguous axial images were obtained from the base of the skull through the vertex without intravenous contrast. COMPARISON:  None. FINDINGS: Brain: Mild generalized parenchymal volume loss with commensurate dilatation of the ventricles and sulci. No mass, hemorrhage, edema or other evidence of acute parenchymal abnormality. No extra-axial hemorrhage. Vascular: No hyperdense vessel or unexpected calcification. Skull: Normal. Negative for  fracture or focal lesion. Sinuses/Orbits: No acute finding. Other: None. IMPRESSION: Negative head CT. No intracranial mass, hemorrhage or edema. Electronically Signed   By: Franki Cabot M.D.   On: 03/26/2020 11:36    Procedures Procedures (including critical care time)  Medications Ordered in ED Medications  sodium chloride flush (NS) 0.9 % injection 3 mL (has no administration in time range)  lactated ringers infusion (has no administration in time range)  acetaminophen (TYLENOL) tablet 650 mg (has no administration in time range)  aspirin EC  tablet 325 mg (has no administration in time range)  hydrALAZINE (APRESOLINE) tablet 25 mg (has no administration in time range)  metoprolol tartrate (LOPRESSOR) tablet 50 mg (has no administration in time range)  rosuvastatin (CRESTOR) tablet 20 mg (has no administration in time range)  vitamin B-12 (CYANOCOBALAMIN) tablet 100 mcg (has no administration in time range)  loratadine (CLARITIN) tablet 10 mg (has no administration in time range)   stroke: mapping our early stages of recovery book (has no administration in time range)  acetaminophen (TYLENOL) tablet 650 mg (has no administration in time range)  senna-docusate (Senokot-S) tablet 1 tablet (has no administration in time range)  enoxaparin (LOVENOX) injection 40 mg (has no administration in time range)  dextrose 5 %-0.9 % sodium chloride infusion (has no administration in time range)  insulin aspart (novoLOG) injection 0-9 Units (has no administration in time range)  furosemide (LASIX) tablet 20 mg (has no administration in time range)    ED Course  I have reviewed the triage vital signs and the nursing notes.  Pertinent labs & imaging results that were available during my care of the patient were reviewed by me and considered in my medical decision making (see chart for details).    MDM Rules/Calculators/A&P                              Consult: Dr. Roosevelt Locks for admission Consult: Neurology, PA-C Etta Quill recommends MRI and EEG.  Patient presents as outlined above.  He had hypoglycemic episode but has not returned to baseline mentation.  Patient has encephalopathic features of both long-term and short-term memory deficits.  Extensive discussion with patient his wife does not suggest incremental dementia.  Review of systems does not suggest patient has had infectious illness.  Labs and CT at this time grossly within normal limits.  We will proceed with MRI and EEG.  Consideration given to ongoing effects of more prolonged  hypoglycemia, CVA, unmasked dementia.  Will admit to medical service for further diagnostic evaluation and treatment.  Final Clinical Impression(s) / ED Diagnoses Final diagnoses:  Hypoglycemia  Encephalopathy acute    Rx / DC Orders ED Discharge Orders    None       Charlesetta Shanks, MD 03/26/20 1344

## 2020-03-26 NOTE — ED Notes (Signed)
Pt asked for snacks. Gave Ginger Ale and cracker. Approved By Garlon Hatchet, RN

## 2020-03-26 NOTE — H&P (Signed)
History and Physical    [name] DJS:[id number] DOB: Feb 22, 1951 DOA: 03/26/2020  PCP: Debbrah Alar, NP (Confirm with patient/family/NH records and if not entered, this has to be entered at [facility] point of entry) Patient coming from: Home  I have personally briefly reviewed patient's old medical records in [place]  Chief Complaint: I can not remember much  HPI: [name] is a 69 y.o. male with medical history significant of IIDM, CAD status post CABG 2019, HTN, HLD, OSA, presented with altered mental status and fall.  Patient is still somewhat confused at the time I saw him, most history was provided by patient and wife at bedside.  Wife reported patient was last seen around 9:00 last night watching TV.  This morning around 6:00, wife woke up and found the patient was tossing and turning, both eyes open and looking straight forward but not responding to voice or physical stimulus.  And then patient rose up and went out of bed but collapsed after only few steps, wife was able to hold him down and denied any direct injury to have or other part of the body.  EMS came and found patient glucose 40s, patient was given D50 and glucose rose to 118, blood pressure 140/90, heart rate 90.  Wife denied there is any cough fever chills or diarrhea.  States recent A1c 7.2 and has been stable, no recent changes of his diabetic medications including insulin regimen and pills.  Patient wife reported patient never had hypoglycemia before. ED Course: Blood glucose stabilized in the lower 100s, however patient despite recovers orientation x3 but still remain confused especially memory [place] not remembering what was happening this morning.  CT head negative for structural changes or bleeding.  Neurology contacted and MRI and EEG ordered.  Review of Systems: As per HPI otherwise 10 point review of systems negative.    Past Medical History:  Diagnosis Date   Abnormal myocardial perfusion  study 2006   EF 44% ? inferoseptal ischemia. no cath done   Breast mass, right 03/2008   Contact dermatitis 01/31/2013   COPD (chronic obstructive pulmonary disease) ([place])    "dx'd 12/2017"   Coronary artery disease    Heart murmur    History of colonic polyps    Hyperlipidemia    Hypertension    Low back pain    Oxygen deficiency    uses 2L at night   Type II diabetes mellitus ([place])     Past Surgical History:  Procedure Laterality Date   ABDOMINAL AORTOGRAM N/A 01/13/2018   Procedure: ABDOMINAL AORTOGRAM;  Surgeon: Troy Sine, MD;  Location: [place] [facility];  Service: Cardiovascular;  Laterality: N/A;   AORTIC VALVE REPLACEMENT N/A 03/04/2018   Procedure: AORTIC VALVE REPLACEMENT (AVR) 36mm Edwards Magna Ease Tissue Valve.;  Surgeon: Melrose Nakayama, MD;  Location: [facility];  Service: Open Heart Surgery;  Laterality: N/A;   CARDIAC VALVE REPLACEMENT     COLONOSCOPY W/ POLYPECTOMY     COLONOSCOPY WITH PROPOFOL N/A 02/22/2020   Procedure: COLONOSCOPY WITH PROPOFOL;  Surgeon: Doran Stabler, MD;  Location: [facility];  Service: Gastroenterology;  Laterality: N/A;   CORONARY ARTERY BYPASS GRAFT N/A 03/04/2018   Procedure: CORONARY ARTERY BYPASS GRAFTING (CABG) x1:  LIMA to LAD.;  Surgeon: Melrose Nakayama, MD;  Location: [facility];  Service: Open Heart Surgery;  Laterality: N/A;   HEMOSTASIS CLIP PLACEMENT  02/22/2020   Procedure: HEMOSTASIS CLIP PLACEMENT;  Surgeon: Wilfrid Lund  L III, MD;  Location: [facility];  Service: Gastroenterology;;   LIPOMA EXCISION Left 07/2008    "back of shoulder" Dr Gershon Crane   POLYPECTOMY  02/22/2020   Procedure: POLYPECTOMY;  Surgeon: Doran Stabler, MD;  Location: [facility];  Service: Gastroenterology;;   RIGHT/LEFT HEART CATH AND CORONARY ANGIOGRAPHY N/A 01/13/2018   Procedure: RIGHT/LEFT HEART CATH AND CORONARY ANGIOGRAPHY;  Surgeon: Troy Sine, MD;  Location: [facility];  Service: Cardiovascular;   Laterality: N/A;   TEE WITHOUT CARDIOVERSION N/A 03/04/2018   Procedure: TRANSESOPHAGEAL ECHOCARDIOGRAM (TEE);  Surgeon: Melrose Nakayama, MD;  Location: [facility];  Service: Open Heart Surgery;  Laterality: N/A;   TONSILLECTOMY     WISDOM TOOTH EXTRACTION       reports that he quit smoking about 9 years ago. His smoking use included cigarettes. He has a 61.50 pack-year smoking history. He has never used smokeless tobacco. He reports current alcohol use. He reports previous drug use.  Allergies  Allergen Reactions   Amlodipine Besylate Other (See Comments)    REACTION: ? caused left axillary nodules/chest flutter   Hydrochlorothiazide Hives   Lisinopril     Hyperkalemia     Family History  Problem Relation Age of Onset   Melanoma Father 34       deceased secondary to melanoma   Alzheimer's disease Father    Diabetes Father    Hypertension Mother        alive -79   Colon cancer Neg Hx    Esophageal cancer Neg Hx    Colon polyps Neg Hx    Stomach cancer Neg Hx      Prior to Admission medications   Medication Sig Start Date End Date Taking? Authorizing Provider  aspirin EC 325 MG EC tablet Take 1 tablet (325 mg total) by mouth daily. 03/11/18   Gold, Wilder Glade, PA-C  cetirizine (ZYRTEC) 10 MG tablet Take 10 mg by mouth daily as needed for allergies.    [provider]  Cyanocobalamin (VITAMIN B-12 PO) Take 1 tablet by mouth daily.    [provider]  dapagliflozin propanediol (FARXIGA) 5 MG TABS tablet Take 5 mg by mouth daily. 12/31/19   Debbrah Alar, NP  furosemide (LASIX) 20 MG tablet TAKE 1 TABLET BY MOUTH EVERY DAY Patient taking differently: Take 20 mg by mouth daily.  02/07/20   Park Liter, MD  glucose blood (PRODIGY NO CODING BLOOD GLUC) test strip Use as instructed to check blood sugar twice a day.  DX  E11.40 06/05/16   Debbrah Alar, NP  hydrALAZINE (APRESOLINE) 25 MG tablet TAKE 1 TABLET BY MOUTH THREE TIMES A  DAY Patient taking differently: Take 25 mg by mouth 3 (three) times daily.  12/21/19   Debbrah Alar, NP  insulin aspart (NOVOLOG FLEXPEN) 100 UNIT/ML FlexPen INJECT 11-15 UNITS SUBCUTANEOUSLY DAILY BEFORE SUPPER 03/20/20   Debbrah Alar, NP  insulin detemir (LEVEMIR FLEXTOUCH) 100 UNIT/ML FlexPen INJECT 50 UNITS INTO THE SKIN DAILY AT 10 PM. Patient taking differently: Inject 50 Units into the skin daily. INJECT 50 UNITS INTO THE SKIN DAILY 12/31/19   Debbrah Alar, NP  Insulin Pen Needle (B-D ULTRAFINE III SHORT PEN) 31G X 8 MM MISC USE AS DIRECTED 11/07/16   Debbrah Alar, NP  metFORMIN (GLUCOPHAGE) 1000 MG tablet Take 1 tablet (1,000 mg total) by mouth 2 (two) times daily with a meal. 12/31/19   Debbrah Alar, NP  metoprolol tartrate (LOPRESSOR) 50 MG tablet Take 1 tablet (  50 mg total) by mouth 2 (two) times daily. 03/20/20   Debbrah Alar, NP  rosuvastatin (CRESTOR) 20 MG tablet TAKE 1 TABLET BY MOUTH EVERY DAY 02/14/20   Debbrah Alar, NP  tadalafil (CIALIS) 20 MG tablet Take 1 tablet (20 mg total) by mouth daily as needed for erectile dysfunction. 10/01/19   Debbrah Alar, NP    Physical Exam: Vitals:   03/26/20 1205  Temp: 99.5 F (37.5 C)  TempSrc: Rectal    Constitutional: NAD, calm, comfortable Vitals:   03/26/20 1205  Temp: 99.5 F (37.5 C)  TempSrc: Rectal   Eyes: PERRL, lids and conjunctivae normal ENMT: Mucous membranes are moist. Posterior pharynx clear of any exudate or lesions.Normal dentition.  Neck: normal, supple, no masses, no thyromegaly Respiratory: clear to auscultation bilaterally, no wheezing, no crackles. Normal respiratory effort. No accessory muscle use.  Cardiovascular: Regular rate and rhythm, no murmurs / rubs / gallops. No extremity edema. 2+ pedal pulses. No carotid bruits.  Abdomen: no tenderness, no masses palpated. No hepatosplenomegaly. Bowel sounds positive.  Musculoskeletal: no clubbing / cyanosis. No joint  deformity upper and lower extremities. Good ROM, no contractures. Normal muscle tone.  Skin: no rashes, lesions, ulcers. No induration Neurologic: CN 2-12 grossly intact. Sensation intact, DTR normal. Strength 5/5 in all 4.  Psychiatric: Normal judgment and insight. Alert and oriented x 3. Normal mood.  Somewhat confused about permanent event that happened this morning.    Labs on Admission: I have personally reviewed following labs and imaging studies  CBC: Recent Labs  Lab 03/26/20 0924 03/26/20 1152  WBC 10.5  --   HGB 16.1 18.0*  HCT 54.9* 53.0*  MCV 75.7*  --   PLT 478*  --    Basic Metabolic Panel: Recent Labs  Lab 03/23/20 1129 03/26/20 0924 03/26/20 1152  NA 140 142 140  K 3.8 3.5 4.4  CL 104 104  --   CO2 27 26  --   GLUCOSE 70 66*  --   BUN 16 9  --   CREATININE 0.81 0.84  --   CALCIUM 9.4 9.4  --    GFR: Estimated Creatinine Clearance: 91.3 mL/min (by C-G formula based on SCr of 0.84 mg/dL). Liver Function Tests: Recent Labs  Lab 03/26/20 0924  AST 22  ALT 13  ALKPHOS 46  BILITOT 0.6  PROT 7.5  ALBUMIN 4.2   Recent Labs  Lab 03/26/20 0924  LIPASE 62*   Recent Labs  Lab 03/26/20 1140  AMMONIA 11   Coagulation Profile: No results for input(s): INR, PROTIME in the last 168 hours. Cardiac Enzymes: Recent Labs  Lab 03/26/20 1140  CKTOTAL 85   BNP (last 3 results) No results for input(s): PROBNP in the last 8760 hours. HbA1C: No results for input(s): HGBA1C in the last 72 hours. CBG: Recent Labs  Lab 03/26/20 0914 03/26/20 1003 03/26/20 1054  GLUCAP 71 79 80   Lipid Profile: No results for input(s): CHOL, HDL, LDLCALC, TRIG, CHOLHDL, LDLDIRECT in the last 72 hours. Thyroid Function Tests: No results for input(s): TSH, T4TOTAL, FREET4, T3FREE, THYROIDAB in the last 72 hours. Anemia Panel: No results for input(s): VITAMINB12, FOLATE, FERRITIN, TIBC, IRON, RETICCTPCT in the last 72 hours. Urine analysis:    Component Value  Date/Time   COLORURINE YELLOW 03/02/2018 1502   APPEARANCEUR CLEAR 03/02/2018 1502   LABSPEC 1.021 03/02/2018 1502   PHURINE 5.0 03/02/2018 1502   GLUCOSEU >=500 (A) 03/02/2018 1502   GLUCOSEU NEGATIVE 03/02/2015 0807   HGBUR  NEGATIVE 03/02/2018 1502   BILIRUBINUR NEGATIVE 03/02/2018 1502   KETONESUR NEGATIVE 03/02/2018 1502   PROTEINUR NEGATIVE 03/02/2018 1502   UROBILINOGEN 1.0 03/02/2015 0807   NITRITE NEGATIVE 03/02/2018 1502   LEUKOCYTESUR NEGATIVE 03/02/2018 1502    Radiological Exams on Admission: DG Chest 1 View  Result Date: 03/26/2020 CLINICAL DATA:  Altered mental status.  Previous CABG. EXAM: CHEST  1 VIEW COMPARISON:  03/31/2018; chest CT-04/26/2019 FINDINGS: Grossly unchanged cardiac silhouette and mediastinal contours post median sternotomy and CABG. The lungs remain hyperexpanded with flattening the diaphragms and thinning of the biapical pulmonary parenchyma. The pulmonary vasculature appears less distinct than present examination with cephalization of flow. No discrete focal airspace opacities. No pleural effusion, though note, the left costophrenic angle is excluded view. No pneumothorax. No evidence of edema. No acute osseous abnormalities. IMPRESSION: Suspected pulmonary edema superimposed on advanced emphysematous change. Electronically Signed   By: Sandi Mariscal M.D.   On: 03/26/2020 11:10   CT Head Wo Contrast  Result Date: 03/26/2020 CLINICAL DATA:  Altered mental status. EXAM: CT HEAD WITHOUT CONTRAST TECHNIQUE: Contiguous axial images were obtained from the base of the skull through the vertex without intravenous contrast. COMPARISON:  None. FINDINGS: Brain: Mild generalized parenchymal volume loss with commensurate dilatation of the ventricles and sulci. No mass, hemorrhage, edema or other evidence of acute parenchymal abnormality. No extra-axial hemorrhage. Vascular: No hyperdense vessel or unexpected calcification. Skull: Normal. Negative for fracture or focal lesion.  Sinuses/Orbits: No acute finding. Other: None. IMPRESSION: Negative head CT. No intracranial mass, hemorrhage or edema. Electronically Signed   By: Franki Cabot M.D.   On: 03/26/2020 11:36    EKG: Independently reviewed.  Seems there is new onset LBBB compared to EKG done in 2019.  Assessment/Plan Active Problems:   Hypoglycemia due to insulin   Hypoglycemia  (please populate well all problems here in Problem List. (For example, if patient is on BP meds at home and you resume or decide to hold them, it is a problem that needs to be her. Same for CAD, COPD, HLD and so on)  Acute metabolic encephalopathy with retrospective global amnesia -Likely from prolonged hypoglycemia event, might be an isolated one given pt never had such event before and A1C has been stable arund 7.2 since last year and no changes of his DM regimen, will send TSH and cortisol. -Neurology on board, given the prolonged poor recovery mentation wise, neurology ordered EEG and MRI.  IDDM with hypoglycemia -Glucose level still borderline low, will start D5 normal saline through the night until patient can resume p.o. intake. -Sliding scale for now until his glucose level further improved and stabilized, meanwhile we will hold all his home insulin regimen and diabetic pills including Farxiga and Metformin  New onset LBBB -Patient denied any chest pains and 2 sets troponin remain negative, he had triple bypass in 2019, x-ray report as there are some lung congestion and cardiomegaly.  However denied any chest pain or short of breath -Check echocardiogram  HTN -Fairly controlled, on IV fluids for now for today, restart Lasix tomorrow  HLD -On Statin    DVT prophylaxis: Lovenox Code Status: Full code Family Communication: Wife at bedside Disposition Plan: Likely can be discharged within 24 hours after glucose level stabilized and neurology study done.   Consults called: Neuro Admission status: Tele Obs   Lequita Halt MD [facility] Pager [phone]  03/26/2020, 1:29 PM

## 2020-03-26 NOTE — ED Notes (Signed)
Pt given orange juice but did not like it so he was given coca cola instead which he is drinking at this moment.

## 2020-03-26 NOTE — Procedures (Signed)
Patient Name: [name]  MRN: [id number]  Epilepsy Attending: Lora Havens  Referring Physician/Provider: Dr Charlesetta Shanks Date: 03/26/2020 Duration: 23.36 mins  Patient history: 69yo M with ams. EEG to evaluate for seizure.  Level of alertness: Awake  AEDs during EEG study: None  Technical aspects: This EEG study was done with scalp electrodes positioned according to the 10-20 International system of electrode placement. Electrical activity was acquired at a sampling rate of 500Hz  and reviewed with a high frequency filter of 70Hz  and a low frequency filter of 1Hz . EEG data were recorded continuously and digitally stored.   Description: No posterior dominant rhythm was seen. EEG showed polymorphic mixed frequencies with predominantly generalized 9-10Hz  alpha activity admixed with 3-5hz  theta-delta slowing.   Hyperventilation and photic stimulation were not performed.     ABNORMALITY -Continuous slow, generalized  IMPRESSION: This study is suggestive of moderate diffuse encephalopathy, nonspecific etiology. No seizures or epileptiform discharges were seen throughout the recording.   Blakeley Margraf Barbra Sarks

## 2020-03-26 NOTE — Progress Notes (Signed)
EEG Completed; Results Pending  

## 2020-03-26 NOTE — Consult Note (Addendum)
Neurology Consultation  Reason for Consult: Altered mental status in the setting of hypoglycemia  Referring Physician: Dr. Johnney Killian  CC: Confusion  History is obtained from: Wife  HPI: [name] is a 69 y.o. male with a history of diabetes, oxygen deficiency, hypertension, hyperlipidemia and CAD.  Patient's wife states that she went to bed about 9:30 PM last night, at which time her husband was normal.  Her husband usually goes to bed around 11:00 PM.  At approximately 5:30 in the morning she awoke to him moaning and slapping the bed with his eyes closed.  She gently tried to wake him, his eyes opened and he stopped hitting the bed.  However, she states that after a little time he started to do the same exact thing.  She had asked him if he wanted some water and he did not respond.  There was a subsequent third period of time when he again started to slap the bed, and also was moving his arms as if he was swimming.  When she rubbed him he stopped; however, it seemed as though he was just looking through her with an absent stare. He then got out of bed and tried to ambulated, but collapsed after only a few steps. She denies any diaphoresis or clammy skin at that time.  Due to the above events she called EMS.  When EMS arrived it was found that he had a blood glucose of 40.  Patient was given D50 and glucose rose to 118. He was then brought to the ED where CBG continued to stay stable in the low 100's. However, he remained confused and was also unable to remember the events that had occurred earlier in the day.   Of note: Wife did state that there have been some times when he forgets if he took his medications.  This does bring up the possibility he took an extra dose of his diabetes medication. Also suggests baseline cognitive dysfunction, possibly due to an incipient dementia.   When in the room with patient.  He does not recall why he is here.  He does know where he is but does have multiple  problems with cognitively challenging questions.   Past Medical History:  Diagnosis Date  . Abnormal myocardial perfusion study 2006   EF 44% ? inferoseptal ischemia. no cath done  . Breast mass, right 03/2008  . Contact dermatitis 01/31/2013  . COPD (chronic obstructive pulmonary disease) ([place])    "dx'd 12/2017"  . Coronary artery disease   . Heart murmur   . History of colonic polyps   . Hyperlipidemia   . Hypertension   . Low back pain   . Oxygen deficiency    uses 2L at night  . Type II diabetes mellitus ([facility])     Family History  Problem Relation Age of Onset  . Melanoma Father 65       deceased secondary to melanoma  . Alzheimer's disease Father   . Diabetes Father   . Hypertension Mother        alive -62  . Colon cancer Neg Hx   . Esophageal cancer Neg Hx   . Colon polyps Neg Hx   . Stomach cancer Neg Hx    Social History:   reports that he quit smoking about 9 years ago. His smoking use included cigarettes. He has a 61.50 pack-year smoking history. He has never used smokeless tobacco. He reports current alcohol use. He reports previous drug use.  Medications  Current Facility-Administered Medications:  .   stroke: mapping our early stages of recovery book, , Does not apply, Once, Wynetta Fines T, MD .  acetaminophen (TYLENOL) tablet 650 mg, 650 mg, Oral, Q4H PRN **OR** [DISCONTINUED] acetaminophen (TYLENOL) 160 MG/5ML solution 650 mg, 650 mg, Per Tube, Q4H PRN **OR** [DISCONTINUED] acetaminophen (TYLENOL) suppository 650 mg, 650 mg, Rectal, Q4H PRN, Wynetta Fines T, MD .  aspirin EC tablet 325 mg, 325 mg, Oral, Daily, Wynetta Fines T, MD, 325 mg at 03/26/20 1440 .  dextrose 5 %-0.9 % sodium chloride infusion, , Intravenous, Continuous, Zhang, Ping T, MD .  enoxaparin (LOVENOX) injection 40 mg, 40 mg, Subcutaneous, Q24H, Wynetta Fines T, MD, 40 mg at 03/26/20 1442 .  [START ON 03/27/2020] furosemide (LASIX) tablet 20 mg, 20 mg, Oral, Daily, Zhang, Ping T, MD .  hydrALAZINE  (APRESOLINE) tablet 25 mg, 25 mg, Oral, TID, Zhang, Ping T, MD .  insulin aspart (novoLOG) injection 0-9 Units, 0-9 Units, Subcutaneous, TID WC, Lequita Halt, MD .  lactated ringers infusion, , Intravenous, Continuous, Charlesetta Shanks, MD, Last Rate: 150 mL/hr at 03/26/20 1447, New Bag at 03/26/20 1447 .  loratadine (CLARITIN) tablet 10 mg, 10 mg, Oral, Daily, Wynetta Fines T, MD, 10 mg at 03/26/20 1440 .  metoprolol tartrate (LOPRESSOR) tablet 50 mg, 50 mg, Oral, BID, Wynetta Fines T, MD, 50 mg at 03/26/20 1439 .  rosuvastatin (CRESTOR) tablet 20 mg, 20 mg, Oral, Daily, Wynetta Fines T, MD, 20 mg at 03/26/20 1441 .  senna-docusate (Senokot-S) tablet 1 tablet, 1 tablet, Oral, QHS PRN, Wynetta Fines T, MD .  sodium chloride flush (NS) 0.9 % injection 3 mL, 3 mL, Intravenous, Once, Pfeiffer, Marcy, MD .  vitamin B-12 (CYANOCOBALAMIN) tablet 100 mcg, 100 mcg, Oral, Daily, Wynetta Fines T, MD  Current Outpatient Medications:  .  aspirin EC 325 MG EC tablet, Take 1 tablet (325 mg total) by mouth daily., Disp: , Rfl: 0 .  cetirizine (ZYRTEC) 10 MG tablet, Take 10 mg by mouth daily as needed for allergies., Disp: , Rfl:  .  Cyanocobalamin (VITAMIN B-12 PO), Take 1 tablet by mouth daily., Disp: , Rfl:  .  dapagliflozin propanediol (FARXIGA) 5 MG TABS tablet, Take 5 mg by mouth daily., Disp: 30 tablet, Rfl: 5 .  furosemide (LASIX) 20 MG tablet, TAKE 1 TABLET BY MOUTH EVERY DAY (Patient taking differently: Take 20 mg by mouth daily. ), Disp: 90 tablet, Rfl: 1 .  hydrALAZINE (APRESOLINE) 25 MG tablet, TAKE 1 TABLET BY MOUTH THREE TIMES A DAY (Patient taking differently: Take 25 mg by mouth 3 (three) times daily. ), Disp: 270 tablet, Rfl: 1 .  insulin aspart (NOVOLOG FLEXPEN) 100 UNIT/ML FlexPen, INJECT 11-15 UNITS SUBCUTANEOUSLY DAILY BEFORE SUPPER, Disp: 15 mL, Rfl: 2 .  insulin detemir (LEVEMIR FLEXTOUCH) 100 UNIT/ML FlexPen, INJECT 50 UNITS INTO THE SKIN DAILY AT 10 PM. (Patient taking differently: Inject 50 Units  into the skin daily. INJECT 50 UNITS INTO THE SKIN DAILY), Disp: 15 mL, Rfl: 3 .  metFORMIN (GLUCOPHAGE) 1000 MG tablet, Take 1 tablet (1,000 mg total) by mouth 2 (two) times daily with a meal., Disp: 180 tablet, Rfl: 1 .  metoprolol tartrate (LOPRESSOR) 50 MG tablet, Take 1 tablet (50 mg total) by mouth 2 (two) times daily., Disp: 180 tablet, Rfl: 1 .  rosuvastatin (CRESTOR) 20 MG tablet, TAKE 1 TABLET BY MOUTH EVERY DAY, Disp: 90 tablet, Rfl: 1 .  tadalafil (CIALIS) 20 MG tablet, Take 1 tablet (20 mg  total) by mouth daily as needed for erectile dysfunction., Disp: 10 tablet, Rfl: 5 .  glucose blood (PRODIGY NO CODING BLOOD GLUC) test strip, Use as instructed to check blood sugar twice a day.  DX  E11.40, Disp: 100 each, Rfl: 1 .  Insulin Pen Needle (B-D ULTRAFINE III SHORT PEN) 31G X 8 MM MISC, USE AS DIRECTED, Disp: 100 each, Rfl: 5  ROS:  Unable to obtain due to altered mental status.   Exam: Current vital signs: Temp 99.5 F (37.5 C) (Rectal)  Vital signs in last 24 hours: Temp:  [99.5 F (37.5 C)] 99.5 F (37.5 C) (08/01 1205)   Constitutional: Appears well-developed and well-nourished.  Psych: Confused Eyes: No scleral injection HENT: No OP obstrucion Head: Normocephalic.  Cardiovascular: Normal rate and regular rhythm.  Respiratory: Effort normal, non-labored breathing GI: Soft.  No distension. There is no tenderness.  Skin: WDI  Neuro: Mental Status: Patient is awake and alert. He knows that he is at the hospital, in [place], [place]; correctly gives the year as 2021; also oriented to the month and day of the week.  He is able to name a pen and thumb.  When asked how many quarters are in $2.25 he continues to perseverate on 3.  He does not know why he is here.  He is unable to recall 3 words given to him.  He is unable to spell the word "world" backwards. Speech is fluent with intact comprehension for basic questions and commands.  Cranial Nerves: II: Visual Fields are full.  PERRL III,IV, VI: EOMI without ptosis or diplopia.  V: Facial sensation is symmetric to temperature VII: Facial movement is symmetric.  VIII: hearing is intact to voice XI: Shoulder shrug is symmetric. XII: tongue is midline without atrophy or fasciculations.  Motor: Moving all extremities antigravity. Against resistance strength is 5/5 and symmetric in all 4 extremities, proximally and distally  No drift No aterixis Sensory: Sensation is symmetric to light touch and temperature in the arms and legs. No extinction to DSS.  Deep Tendon Reflexes: 2+ and symmetric in the biceps and patellae.  Plantars: Mute bilaterally Cerebellar: Finger-nose-finger shows no dysmetria  Labs I have reviewed labs in epic and the results pertinent to this consultation are:   CBC    Component Value Date/Time   WBC 10.5 03/26/2020 0924   RBC 7.25 (H) 03/26/2020 0924   HGB 18.0 (H) 03/26/2020 1152   HCT 53.0 (H) 03/26/2020 1152   PLT 478 (H) 03/26/2020 0924   MCV 75.7 (L) 03/26/2020 0924   MCH 22.2 (L) 03/26/2020 0924   MCHC 29.3 (L) 03/26/2020 0924   RDW 18.0 (H) 03/26/2020 0924   LYMPHSABS 1.9 12/30/2017 0858   MONOABS 0.7 12/30/2017 0858   EOSABS 0.1 12/30/2017 0858   BASOSABS 0.0 12/30/2017 0858    CMP     Component Value Date/Time   NA 140 03/26/2020 1152   NA 145 (H) 07/30/2018 1632   K 4.4 03/26/2020 1152   CL 104 03/26/2020 0924   CO2 26 03/26/2020 0924   GLUCOSE 66 (L) 03/26/2020 0924   BUN 9 03/26/2020 0924   BUN 15 07/30/2018 1632   CREATININE 0.84 03/26/2020 0924   CREATININE 0.84 11/30/2013 0856   CALCIUM 9.4 03/26/2020 0924   PROT 7.5 03/26/2020 0924   ALBUMIN 4.2 03/26/2020 0924   AST 22 03/26/2020 0924   ALT 13 03/26/2020 0924   ALKPHOS 46 03/26/2020 0924   BILITOT 0.6 03/26/2020 0924   GFRNONAA >60  03/26/2020 0924   GFRNONAA >89 10/26/2013 0906   GFRAA >60 03/26/2020 0924   GFRAA >89 10/26/2013 0906    Lipid Panel     Component Value Date/Time   CHOL 127  03/23/2020 1129   TRIG 66.0 03/23/2020 1129   HDL 50.00 03/23/2020 1129   CHOLHDL 3 03/23/2020 1129   VLDL 13.2 03/23/2020 1129   LDLCALC 64 03/23/2020 1129   LDLDIRECT 177.6 10/07/2007 1001   Ammonia 11 Urinalysis pending TSH pending Ethanol negative  Imaging I have reviewed the images obtained:  CT-scan of the brain-negative head CT  MRI examination of the brain - no acute infarction.   EEG-study is suggestive of moderate diffuse encephalopathy.  No seizures or epileptiform discharges seen  Etta Quill PA-C [facility] [phone] 03/26/2020, 3:10 PM   Assessment:  69 year old male presenting to the hospital with confusion after he was noted by EMS to be moderate to severely hypoglycemic with a CBG of 40.  -- There is a possibility that he may have taken an extra dose of Levemir.   -- Despite normalization of his CBG, the patient remains confused, Cognitive exam findings are consistent with deficits in 2 domains: short term memory and frontal executive function.   -- EEG is suggestive of a moderate diffuse encephalopathy, but does not show any epileptiform activity.   -- MRI examination of the brain - no acute infarction.  -- Overall clinical presentation most consistent with residual encephalopathy secondary to hypoglycemia earlier today. Also given the history, he may have an underlying subclinical/incipient dementia.  Recommendations: - Continue to monitor and correct blood glucose as needed -- Outpatient Neurology follow up for dementia evaluation -- Neurohospitalist service will sign off. Please call if there are any additional questions.   I have seen and examined the patient. I have formulated the assessment and recommendations. 69 year old male presenting to the hospital with confusion after he was noted by EMS to be moderate to severely hypoglycemic with a CBG of 40. Overall clinical presentation most consistent with residual encephalopathy secondary to  hypoglycemia earlier today. Also given the history, he may have an underlying subclinical/incipient dementia. Will need outpatient Neurology follow up for dementia evaluation. Electronically signed: Dr. Kerney Elbe

## 2020-03-26 NOTE — ED Notes (Signed)
Patient transported to MRI 

## 2020-03-26 NOTE — ED Triage Notes (Signed)
Per [facility] pt from home, confused and combative for family and EMS, CBG 40, pt is a diabetic normal CBG 120's. Pt seen at PCP on Friday, with a normal exam.    25g D10 CBG 118  20g LAC  BP 140/90 HR 90

## 2020-03-27 ENCOUNTER — Observation Stay (HOSPITAL_COMMUNITY): Payer: Medicare Other

## 2020-03-27 DIAGNOSIS — I2510 Atherosclerotic heart disease of native coronary artery without angina pectoris: Secondary | ICD-10-CM | POA: Diagnosis present

## 2020-03-27 DIAGNOSIS — I447 Left bundle-branch block, unspecified: Secondary | ICD-10-CM | POA: Diagnosis not present

## 2020-03-27 DIAGNOSIS — Z8719 Personal history of other diseases of the digestive system: Secondary | ICD-10-CM | POA: Diagnosis not present

## 2020-03-27 DIAGNOSIS — Z833 Family history of diabetes mellitus: Secondary | ICD-10-CM | POA: Diagnosis not present

## 2020-03-27 DIAGNOSIS — Z8249 Family history of ischemic heart disease and other diseases of the circulatory system: Secondary | ICD-10-CM | POA: Diagnosis not present

## 2020-03-27 DIAGNOSIS — I5022 Chronic systolic (congestive) heart failure: Secondary | ICD-10-CM | POA: Diagnosis not present

## 2020-03-27 DIAGNOSIS — E11649 Type 2 diabetes mellitus with hypoglycemia without coma: Secondary | ICD-10-CM | POA: Diagnosis not present

## 2020-03-27 DIAGNOSIS — E785 Hyperlipidemia, unspecified: Secondary | ICD-10-CM | POA: Diagnosis present

## 2020-03-27 DIAGNOSIS — E160 Drug-induced hypoglycemia without coma: Secondary | ICD-10-CM | POA: Diagnosis not present

## 2020-03-27 DIAGNOSIS — I429 Cardiomyopathy, unspecified: Secondary | ICD-10-CM | POA: Diagnosis present

## 2020-03-27 DIAGNOSIS — Z808 Family history of malignant neoplasm of other organs or systems: Secondary | ICD-10-CM | POA: Diagnosis not present

## 2020-03-27 DIAGNOSIS — Z87891 Personal history of nicotine dependence: Secondary | ICD-10-CM | POA: Diagnosis not present

## 2020-03-27 DIAGNOSIS — R9431 Abnormal electrocardiogram [ECG] [EKG]: Secondary | ICD-10-CM

## 2020-03-27 DIAGNOSIS — T383X5A Adverse effect of insulin and oral hypoglycemic [antidiabetic] drugs, initial encounter: Secondary | ICD-10-CM

## 2020-03-27 DIAGNOSIS — Z952 Presence of prosthetic heart valve: Secondary | ICD-10-CM | POA: Diagnosis not present

## 2020-03-27 DIAGNOSIS — Z820 Family history of epilepsy and other diseases of the nervous system: Secondary | ICD-10-CM | POA: Diagnosis not present

## 2020-03-27 DIAGNOSIS — Z20822 Contact with and (suspected) exposure to covid-19: Secondary | ICD-10-CM | POA: Diagnosis present

## 2020-03-27 DIAGNOSIS — R413 Other amnesia: Secondary | ICD-10-CM | POA: Diagnosis present

## 2020-03-27 DIAGNOSIS — E162 Hypoglycemia, unspecified: Secondary | ICD-10-CM | POA: Diagnosis not present

## 2020-03-27 DIAGNOSIS — R0989 Other specified symptoms and signs involving the circulatory and respiratory systems: Secondary | ICD-10-CM | POA: Diagnosis not present

## 2020-03-27 DIAGNOSIS — Z951 Presence of aortocoronary bypass graft: Secondary | ICD-10-CM | POA: Diagnosis not present

## 2020-03-27 DIAGNOSIS — Z794 Long term (current) use of insulin: Secondary | ICD-10-CM | POA: Diagnosis not present

## 2020-03-27 DIAGNOSIS — I110 Hypertensive heart disease with heart failure: Secondary | ICD-10-CM | POA: Diagnosis present

## 2020-03-27 DIAGNOSIS — Z7982 Long term (current) use of aspirin: Secondary | ICD-10-CM | POA: Diagnosis not present

## 2020-03-27 DIAGNOSIS — G9341 Metabolic encephalopathy: Secondary | ICD-10-CM | POA: Diagnosis present

## 2020-03-27 DIAGNOSIS — Z888 Allergy status to other drugs, medicaments and biological substances status: Secondary | ICD-10-CM | POA: Diagnosis not present

## 2020-03-27 DIAGNOSIS — Z79899 Other long term (current) drug therapy: Secondary | ICD-10-CM | POA: Diagnosis not present

## 2020-03-27 DIAGNOSIS — J449 Chronic obstructive pulmonary disease, unspecified: Secondary | ICD-10-CM | POA: Diagnosis present

## 2020-03-27 LAB — GLUCOSE, CAPILLARY
Glucose-Capillary: 140 mg/dL — ABNORMAL HIGH (ref 70–99)
Glucose-Capillary: 166 mg/dL — ABNORMAL HIGH (ref 70–99)
Glucose-Capillary: 136 mg/dL — ABNORMAL HIGH (ref 70–99)
Glucose-Capillary: 159 mg/dL — ABNORMAL HIGH (ref 70–99)

## 2020-03-27 MED ORDER — PERFLUTREN LIPID MICROSPHERE
1.0000 mL | INTRAVENOUS | Status: AC | PRN
  Administered 2020-03-27: 4 mL via INTRAVENOUS
  Filled 2020-03-27: qty 10

## 2020-03-27 NOTE — Evaluation (Signed)
Occupational Therapy Evaluation Patient Details Name: [name] MRN: [id number] DOB: [birth year] Today's Date: 03/27/2020    History of Present Illness  [name] is a 69 y.o. male with medical history significant of IIDM, CAD status post CABG 2019, HTN, HLD, OSA, presented with altered mental status and fall Pt found to have blood glucose of 40.    Clinical Impression   PTA, pt lives with wife and reports Independence with all ADLs, IADLs, mobility. Pt reports still driving and occasionally working. Pt presents now with mild deficits in dynamic standing balance, but suspect deficits will resolve as pt's mentation has improved. Pt demonstrated ability to complete ADLs with Supervision at most to ensure safety during mobility, but Independent for majority of ADLs assessed. Provided supervision for short distance mobility with mild unsteadiness noted, but overall WFL as pt demonstrated ability to pick up ADL items from floor without LOB. No skilled OT needs indicated at acute level or on discharge. Although, recommended pt use pill box for improved safety with medication mgmt. OT to sign off.     Follow Up Recommendations  No OT follow up    Equipment Recommendations  None recommended by OT    Recommendations for Other Services       Precautions / Restrictions Precautions Precautions: Fall Restrictions Weight Bearing Restrictions: No      Mobility Bed Mobility Overal bed mobility: Independent             General bed mobility comments: Independent, no assist needed  Transfers Overall transfer level: Needs assistance Equipment used: None Transfers: Sit to/from [employer] Transfers Sit to Stand: Independent Stand pivot transfers: Supervision       General transfer comment: Supervision for stand pivot without AD to ensure safety with IV pole    Balance Overall balance assessment: Needs assistance Sitting-balance support: No upper extremity  supported;Feet supported Sitting balance-Leahy Scale: Normal     Standing balance support: No upper extremity supported;During functional activity Standing balance-Leahy Scale: Good Standing balance comment: able to demo mobility without AD,mild unsteadiness. Pt demonstrated ability to pick up objects from floor without LOB                           ADL either performed or assessed with clinical judgement   ADL Overall ADL's : Needs assistance/impaired Eating/Feeding: Independent;Sitting   Grooming: Independent;Wash/dry face;Standing Grooming Details (indicate cue type and reason): Independent to wash face standing at sink without AD Upper Body Bathing: Independent;Sitting   Lower Body Bathing: Sit to/from stand;Independent   Upper Body Dressing : Independent;Sitting   Lower Body Dressing: Independent;Sit to/from stand Lower Body Dressing Details (indicate cue type and reason): Independent to don socks sitting EOB Toilet Transfer: Supervision/safety;Ambulation;Regular Glass blower/designer Details (indicate cue type and reason): supervision for safety with distance ambulation Toileting- Clothing Manipulation and Hygiene: Independent;Sit to/from stand       Functional mobility during ADLs: Supervision/safety General ADL Comments: Supervision at most for ADLs with mobility aspects due to mild unsteadiness without AD.      Vision Baseline Vision/History: Wears glasses Wears Glasses: Reading only Patient Visual Report: No change from baseline Vision Assessment?: No apparent visual deficits     Perception     Praxis      Pertinent Vitals/Pain Pain Assessment: No/denies pain     Hand Dominance Left   Extremity/Trunk Assessment Upper Extremity Assessment Upper Extremity Assessment: Overall WFL for tasks assessed   Lower Extremity  Assessment Lower Extremity Assessment: Defer to PT evaluation   Cervical / Trunk Assessment Cervical / Trunk Assessment:  Normal   Communication Communication Communication: No difficulties   Cognition Arousal/Alertness: Awake/alert Behavior During Therapy: WFL for tasks assessed/performed Overall Cognitive Status: Impaired/Different from baseline Area of Impairment: Following commands;Problem solving                       Following Commands: Follows multi-step commands with increased time     Problem Solving: Slow processing General Comments: Pt A&Ox4, resolving mentation from initial hospital presentation. Pt with some slow processing and difficulty with multi step commands, but with cues and instruction, pt able to demo ability to order breakfast   General Comments  Based on chart review and possibility of accidentally taking extra dose of medication, educated and encouraged use of pill box to maximize safety/independence. Pt reports taking 11 medications daily    Exercises     Shoulder Instructions      Home Living Family/patient expects to be discharged to:: Private residence Living Arrangements: Spouse/significant other Available Help at Discharge: Family;Available 24 hours/day Type of Home: House Home Access: Stairs to enter [employer] of Steps: 1   Home Layout: One level     Bathroom Shower/Tub: Teacher, early years/pre: Standard                Prior Functioning/Environment Level of Independence: Independent        Comments: Independent with all ADLs, IADLs, driving and grocery shopping. 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 Problem List:        OT Treatment/Interventions:      OT Goals(Current goals can be found in the care plan section) Acute Rehab OT Goals Patient Stated Goal: get better and go home  OT Frequency:     Barriers to D/C:            Co-evaluation              AM-PAC OT "6 Clicks" Daily Activity     Outcome Measure Help from another person eating meals?: None Help from another person  taking care of personal grooming?: None Help from another person toileting, which includes using toliet, bedpan, or urinal?: A Little Help from another person bathing (including washing, rinsing, drying)?: None Help from another person to put on and taking off regular upper body clothing?: None Help from another person to put on and taking off regular lower body clothing?: None 6 Click Score: 23   End of Session Equipment Utilized During Treatment: Gait belt Nurse Communication: Mobility status  Activity Tolerance: Patient tolerated treatment well Patient left: in chair;with call bell/phone within reach                   Time: 0751-0815 OT Time Calculation (min): 24 min Charges:  OT General Charges $OT Visit: 1 Visit OT Evaluation $OT Eval Low Complexity: 1 Low OT Treatments $Self Care/Home Management : 8-22 mins  Layla Maw, OTR/L  Layla Maw 03/27/2020, 8:31 AM

## 2020-03-27 NOTE — Progress Notes (Signed)
  Echocardiogram 2D Echocardiogram has been performed with Definity.  [name] 03/27/2020, 5:16 PM

## 2020-03-27 NOTE — Evaluation (Signed)
Physical Therapy Evaluation Patient Details Name: [name] MRN: [id number] DOB: 1951/06/18 Today's Date: 03/27/2020   History of Present Illness  69yo male presenting via EMS after bout of confusion at home, also after collapse at home when trying to ambulate. EMS found blood glucose to be 40, and it improved to 118 with D50. EEG, MRI, CTH all negative for acute event. PMH ejection fraction 44%, COPD, CAD, HLD, HTN, LBP, DM, uses O2 at night  Clinical Impression   Patient received in bed, very pleasant and cooperative with PT today. See below for mobility/assist levels, generally able to mobilize well with independence to min guard for functional gait in hallway- mildly unsteady and easily distracted in hallway but able to maintain balance without increased external support or assist. Did need cues for navigation in hallway and to find his way back to his room as well. He was left sitting up in recliner with spouse and RN present and attending. Feel he will benefit from skilled OP PT services moving forward.     Follow Up Recommendations Outpatient PT    Equipment Recommendations  None recommended by PT    Recommendations for Other Services       Precautions / Restrictions Precautions Precautions: Fall Restrictions Weight Bearing Restrictions: No      Mobility  Bed Mobility Overal bed mobility: Independent             General bed mobility comments: Independent, no assist needed  Transfers Overall transfer level: Independent Equipment used: None Transfers: Sit to/from Stand Sit to Stand: Independent         General transfer comment: independent for all functional transfers, did need assist for managing line/IV pole but otherwise WNL  Ambulation/Gait Ambulation/Gait assistance: Min guard Gait Distance (Feet): 400 Feet Assistive device: None Gait Pattern/deviations: Step-through pattern;Narrow base of support;Scissoring Gait velocity: decreased   General  Gait Details: slow and somewhat steady with gait in hallway but does have occasional scissoring and mild LOB especially when distracted or on turns- able to maintain balance without increased external support however, min guard primarily for safety  Stairs            Wheelchair Mobility    Modified Rankin (Stroke Patients Only)       Balance Overall balance assessment: Needs assistance Sitting-balance support: No upper extremity supported;Feet supported Sitting balance-Leahy Scale: Normal     Standing balance support: No upper extremity supported;During functional activity Standing balance-Leahy Scale: Good Standing balance comment: able to demo mobility without AD,mild unsteadiness. Pt demonstrated ability to pick up objects from floor without LOB. Some mild unsteadiness and scissoring when distracted or making a turn in hallway                             Pertinent Vitals/Pain Pain Assessment: No/denies pain    Home Living                        Prior Function                 Hand Dominance        Extremity/Trunk Assessment   Upper Extremity Assessment Upper Extremity Assessment: Defer to OT evaluation    Lower Extremity Assessment Lower Extremity Assessment: Overall WFL for tasks assessed    Cervical / Trunk Assessment Cervical / Trunk Assessment: Normal  Communication      Cognition Arousal/Alertness: Awake/alert Behavior During Therapy:  WFL for tasks assessed/performed Overall Cognitive Status: Impaired/Different from baseline Area of Impairment: Following commands;Problem solving                       Following Commands: Follows multi-step commands with increased time     Problem Solving: Slow processing General Comments: Pt A&Ox4, resolving mentation from initial hospital presentation. Pt with some slow processing and difficulty with multi step commands; needed repeated cues for navigation in hospital hallway  to find way back to his room as well      General Comments      Exercises     Assessment/Plan    PT Assessment Patient needs continued PT services  PT Problem List Decreased safety awareness;Decreased balance;Decreased coordination       PT Treatment Interventions DME instruction;Balance training;Gait training;Neuromuscular re-education;Stair training;Cognitive remediation;Functional mobility training;Patient/family education;Therapeutic activities;Therapeutic exercise    PT Goals (Current goals can be found in the Care Plan section)  Acute Rehab PT Goals Patient Stated Goal: get better and go home PT Goal Formulation: With patient/family Time For Goal Achievement: 04/10/20 Potential to Achieve Goals: Good    Frequency Min 3X/week   Barriers to discharge        Co-evaluation               AM-PAC PT "6 Clicks" Mobility  Outcome Measure Help needed turning from your back to your side while in a flat bed without using bedrails?: None Help needed moving from lying on your back to sitting on the side of a flat bed without using bedrails?: None Help needed moving to and from a bed to a chair (including a wheelchair)?: None Help needed standing up from a chair using your arms (e.g., wheelchair or bedside chair)?: None Help needed to walk in hospital room?: A Little Help needed climbing 3-5 steps with a railing? : A Little 6 Click Score: 22    End of Session Equipment Utilized During Treatment: Gait belt Activity Tolerance: Patient tolerated treatment well Patient left: in chair;with call bell/phone within reach;with nursing/sitter in room;with family/visitor present Nurse Communication: Mobility status PT Visit Diagnosis: Unsteadiness on feet (R26.81);History of falling (Z91.81)    Time: 0076-2263 PT Time Calculation (min) (ACUTE ONLY): 13 min   Charges:   PT Evaluation $PT Eval Low Complexity: 1 Low          Windell Norfolk, DPT, PN1   Supplemental  Physical Therapist [place]    Pager [phone] Acute Rehab Office [phone]

## 2020-03-27 NOTE — Care Management Obs Status (Signed)
[place] NOTIFICATION   Patient Details  Name: [name] MRN: [id number] Date of Birth: [birth year]   Medicare Observation Status Notification Given:  Yes    Angelita Ingles, RN 03/27/2020, 10:31 AM

## 2020-03-27 NOTE — Progress Notes (Signed)
PROGRESS NOTE  [name]  DOB: 11-Sep-1950  PCP: Debbrah Alar, NP [id number]  DOA: 03/26/2020  LOS: 0 days   Chief Complaint  Patient presents with  . Hypoglycemia   Brief narrative: [name] is a 69 y.o. male with PMH significant for IDDM HTN, HLD, OSA, CAD status post CABG 2019. Patient presented to the ED on 8/1 with altered mental status and fall. Patient's wife noted that he woke up that morning tossing and turning, both eyes open and looking straight forward but not responding to voice or physical stimulus. He got up and went out of bed but collapsed after only few steps. Wife was able to hold him down.   EMS noted blood glucose low at 40, improved to 118 with D50.  Blood pressure 140/90, heart rate 90.    In the ED, blood glucose level stabilized but patient continued to remain disoriented. CT head negative for structural changes or bleeding.   Neurology consultation was obtained. Patient was admitted to hospitalist service for further evaluation management  Subjective: Patient was seen and examined this morning.  Elderly African-American male.  Lying down in bed.  Not in distress.  Mental status improved.  Oriented to all questions.  Physically still mentally weak. Chart reviewed.  Assessment/Plan: Acute metabolic encephalopathy with retrospective global amnesia -Likely secondary to prolonged hypoglycemic event. -Normal TSH and cortisol level. -MRI brain-no acute infarction -EEG moderate diffuse encephalopathy but no epileptiform activity. -Initially patient was disoriented for hours. -Mental status is back to baseline this morning.  Continue to monitor. -Neurology on board.    Hypoglycemia Insulin-dependent diabetes mellitus Lab Results  Component Value Date   HGBA1C 7.2 (H) 03/23/2020  -Blood sugar level low at 40 at home.  Stabilized with dextrose. -Home meds include Levemir 50 units at bedtime, NovoLog 11 to 15 units before supper,  Farxiga 5 mg daily, Metformin 1000 mg twice daily. -Currently all medicines are on hold.  Fasting blood sugar 140 this morning. -Continue Accu-Cheks with sliding scale for now. CAD status post CABG 2019 HLD New onset LBBB -Patient denied any chest pains and 2 sets troponin remain negative, he had triple bypass in 2019, x-ray report as there are some lung congestion and cardiomegaly.  However denied any chest pain or short of breath -echocardiogram ordered. -Continue Aspirin 325 mg daily, Crestor 20 mg daily,  HTN -Home meds include Lopressor 50 mg twice daily, Lasix 20 mg daily, hydralazine 25 mg 3 times daily, -Continue Lopressor and Lasix.  Keep hydralazine on hold.  IV hydralazine as needed.  Mobility: Encourage ambulation.  PT eval obtained.  Outpatient follow-up recommended. Code Status:   Code Status: Full Code  Nutritional status: Body mass index is 20.57 kg/m.     Diet Order            Diet heart healthy/carb modified Room service appropriate? Yes; Fluid consistency: Thin  Diet effective now                 DVT prophylaxis: enoxaparin (LOVENOX) injection 40 mg Start: 03/26/20 1330   Antimicrobials:  None Fluid: None  Consultants: None  Family Communication:  Wife at bedside  Status is: Observation  The patient will require care spanning > 2 midnights and should be moved to inpatient because he needs to be further monitor for blood glucose level fluctuation.  He is to be gradually initiated in his home medications.  Not stable for discharge today.  Dispo: The patient is from: Home  Anticipated d/c is to: Home              Anticipated d/c date is: 2 days              Patient currently is not medically stable to d/c.       Infusions:  . lactated ringers 150 mL/hr at 03/26/20 1447    Scheduled Meds: .  stroke: mapping our early stages of recovery book   Does not apply Once  . aspirin  325 mg Oral Daily  . enoxaparin (LOVENOX) injection  40  mg Subcutaneous Q24H  . furosemide  20 mg Oral Daily  . hydrALAZINE  25 mg Oral TID  . insulin aspart  0-9 Units Subcutaneous TID WC  . loratadine  10 mg Oral Daily  . metoprolol tartrate  50 mg Oral BID  . rosuvastatin  20 mg Oral Daily  . sodium chloride flush  3 mL Intravenous Once  . vitamin B-12  100 mcg Oral Daily    Antimicrobials: Anti-infectives (From admission, onward)   None      PRN meds: acetaminophen **OR** [DISCONTINUED] acetaminophen (TYLENOL) oral liquid 160 mg/5 mL **OR** [DISCONTINUED] acetaminophen, senna-docusate   Objective: Vitals:   03/26/20 2039 03/26/20 2107  BP: (!) 142/88 (!) 136/81  Pulse: 75 78  Resp: 19 18  Temp: 98.8 F (37.1 C) 98.7 F (37.1 C)  SpO2: 91%    No intake or output data in the 24 hours ending 03/27/20 0748 Filed Weights   03/26/20 2039  Weight: 76.7 kg   Weight change:  Body mass index is 20.57 kg/m.   Physical Exam: General exam: Appears calm and comfortable.  Not in physical distress Skin: No rashes, lesions or ulcers. HEENT: Atraumatic, normocephalic, supple neck, no obvious bleeding Lungs: Clear to auscultation bilaterally CVS: Regular rate and rhythm, no murmur GI/Abd soft, nontender, nondistended, bowel sound present CNS: Alert, awake, oriented x3 Psychiatry: Mood appropriate Extremities: No pedal edema, no calf tenderness  Data Review: I have personally reviewed the laboratory data and studies available.  Recent Labs  Lab 03/26/20 0924 03/26/20 1152  WBC 10.5  --   HGB 16.1 18.0*  HCT 54.9* 53.0*  MCV 75.7*  --   PLT 478*  --    Recent Labs  Lab 03/23/20 1129 03/26/20 0924 03/26/20 1152  NA 140 142 140  K 3.8 3.5 4.4  CL 104 104  --   CO2 27 26  --   GLUCOSE 70 66*  --   BUN 16 9  --   CREATININE 0.81 0.84  --   CALCIUM 9.4 9.4  --    Lab Results  Component Value Date   HGBA1C 7.2 (H) 03/23/2020       Component Value Date/Time   CHOL 127 03/23/2020 1129   TRIG 66.0 03/23/2020 1129     HDL 50.00 03/23/2020 1129   CHOLHDL 3 03/23/2020 1129   VLDL 13.2 03/23/2020 1129   LDLCALC 64 03/23/2020 1129   LDLDIRECT 177.6 10/07/2007 1001     Signed, Terrilee Croak, MD [facility] Pager: [phone] (Secure Chat preferred). 03/27/2020

## 2020-03-28 DIAGNOSIS — R0989 Other specified symptoms and signs involving the circulatory and respiratory systems: Secondary | ICD-10-CM

## 2020-03-28 DIAGNOSIS — I447 Left bundle-branch block, unspecified: Secondary | ICD-10-CM

## 2020-03-28 DIAGNOSIS — R931 Abnormal findings on diagnostic imaging of heart and coronary circulation: Secondary | ICD-10-CM

## 2020-03-28 DIAGNOSIS — Z952 Presence of prosthetic heart valve: Secondary | ICD-10-CM

## 2020-03-28 HISTORY — DX: Other specified symptoms and signs involving the circulatory and respiratory systems: R09.89

## 2020-03-28 HISTORY — DX: Abnormal findings on diagnostic imaging of heart and coronary circulation: R93.1

## 2020-03-28 LAB — ECHOCARDIOGRAM COMPLETE
AR max vel: 2.14 cm2
AV Area VTI: 2 cm2
AV Area mean vel: 2.11 cm2
AV Mean grad: 10 mmHg
AV Peak grad: 17.2 mmHg
Ao pk vel: 2.07 m/s
Height: 76 in
S' Lateral: 4.2 cm
Weight: 2704 oz

## 2020-03-28 LAB — BASIC METABOLIC PANEL
Anion gap: 10 (ref 5–15)
BUN: 6 mg/dL — ABNORMAL LOW (ref 8–23)
CO2: 25 mmol/L (ref 22–32)
Calcium: 8.5 mg/dL — ABNORMAL LOW (ref 8.9–10.3)
Chloride: 105 mmol/L (ref 98–111)
Creatinine, Ser: 0.87 mg/dL (ref 0.61–1.24)
GFR calc Af Amer: >60 mL/min (ref >60)
GFR calc non Af Amer: >60 mL/min (ref >60)
Glucose, Bld: 146 mg/dL — ABNORMAL HIGH (ref 70–99)
Potassium: 3.8 mmol/L (ref 3.5–5.1)
Sodium: 140 mmol/L (ref 135–145)

## 2020-03-28 LAB — CBC WITH DIFFERENTIAL/PLATELET
Abs Immature Granulocytes: 0.04 10*3/uL (ref 0.00–0.07)
Basophils Absolute: 0 10*3/uL (ref 0.0–0.1)
Basophils Relative: 1 %
Eosinophils Absolute: 0.1 10*3/uL (ref 0.0–0.5)
Eosinophils Relative: 1 %
HCT: 48 % (ref 39.0–52.0)
Hemoglobin: 14.5 g/dL (ref 13.0–17.0)
Immature Granulocytes: 1 %
Lymphocytes Relative: 23 %
Lymphs Abs: 1.7 10*3/uL (ref 0.7–4.0)
MCH: 22.1 pg — ABNORMAL LOW (ref 26.0–34.0)
MCHC: 30.2 g/dL (ref 30.0–36.0)
MCV: 73.2 fL — ABNORMAL LOW (ref 80.0–100.0)
Monocytes Absolute: 0.8 10*3/uL (ref 0.1–1.0)
Monocytes Relative: 11 %
Neutro Abs: 4.8 10*3/uL (ref 1.7–7.7)
Neutrophils Relative %: 63 %
Platelets: 404 10*3/uL — ABNORMAL HIGH (ref 150–400)
RBC: 6.56 MIL/uL — ABNORMAL HIGH (ref 4.22–5.81)
RDW: 16.9 % — ABNORMAL HIGH (ref 11.5–15.5)
WBC: 7.5 10*3/uL (ref 4.0–10.5)
nRBC: 0 % (ref 0.0–0.2)

## 2020-03-28 LAB — GLUCOSE, CAPILLARY
Glucose-Capillary: 142 mg/dL — ABNORMAL HIGH (ref 70–99)
Glucose-Capillary: 142 mg/dL — ABNORMAL HIGH (ref 70–99)
Glucose-Capillary: 162 mg/dL — ABNORMAL HIGH (ref 70–99)
Glucose-Capillary: 141 mg/dL — ABNORMAL HIGH (ref 70–99)

## 2020-03-28 MED ORDER — LEVEMIR FLEXTOUCH 100 UNIT/ML ~~LOC~~ SOPN: 25.0000 [IU] | PEN_INJECTOR | Freq: Every day | SUBCUTANEOUS | Status: DC

## 2020-03-28 MED ORDER — METOPROLOL SUCCINATE ER 50 MG PO TB24: 50.0000 mg | ORAL_TABLET | Freq: Every day | ORAL | 0 refills | Status: DC

## 2020-03-28 MED ORDER — LOSARTAN POTASSIUM 25 MG PO TABS
25.0000 mg | ORAL_TABLET | Freq: Every day | ORAL | 11 refills | Status: DC
Start: 2020-03-28 — End: 2020-06-07

## 2020-03-28 MED ORDER — LIVING WELL WITH DIABETES BOOK
Freq: Once | Status: AC
  Filled 2020-03-28: qty 1

## 2020-03-28 NOTE — Progress Notes (Signed)
Pt. Discharged in stable condition. Pt left with wife via car. AVS paperwork reviewed and given to pt.

## 2020-03-28 NOTE — Progress Notes (Signed)
Physical Therapy Treatment Patient Details Name: [name] MRN: [id number] DOB: [birth year] Today's Date: 03/28/2020    History of Present Illness 69yo male presenting via EMS after bout of confusion at home, also after collapse at home when trying to ambulate. EMS found blood glucose to be 40, and it improved to 118 with D50. EEG, MRI, CTH all negative for acute event. PMH ejection fraction 44%, COPD, CAD, HLD, HTN, LBP, DM, uses O2 at night    PT Comments    Patient received in bed, very pleasant and cooperative with PT. Continued practicing gait, much more steady today and did benefit from holding IV pole when walking, overall less scissoring and unsteadiness noted as a whole however. Also spent time working on balance exercises including tandem stance, SLS, and cross midline reaches at sink all of which were included in HEP which was texted via [place]. Left sitting up in chair with all needs met this morning.    Follow Up Recommendations  Outpatient PT     Equipment Recommendations  None recommended by PT    Recommendations for Other Services       Precautions / Restrictions Precautions Precautions: Fall Restrictions Weight Bearing Restrictions: No    Mobility  Bed Mobility Overal bed mobility: Independent             General bed mobility comments: Independent, no assist needed  Transfers Overall transfer level: Independent Equipment used: None Transfers: Sit to/from Stand Sit to Stand: Independent         General transfer comment: independent for all functional transfers, did need assist for managing line/IV pole but otherwise WNL  Ambulation/Gait Ambulation/Gait assistance: Min guard Gait Distance (Feet): 200 Feet Assistive device: IV Pole Gait Pattern/deviations: Step-through pattern;Drifts right/left Gait velocity: decreased   General Gait Details: min guard and more steady than yesterday, noted less scissoring and  unsteadiness   Stairs             Wheelchair Mobility    Modified Rankin (Stroke Patients Only)       Balance Overall balance assessment: Needs assistance Sitting-balance support: No upper extremity supported;Feet supported Sitting balance-Leahy Scale: Normal     Standing balance support: No upper extremity supported;During functional activity Standing balance-Leahy Scale: Good                              Cognition Arousal/Alertness: Awake/alert Behavior During Therapy: WFL for tasks assessed/performed Overall Cognitive Status: Impaired/Different from baseline                         Following Commands: Follows one step commands consistently;Follows multi-step commands with increased time     Problem Solving: Slow processing General Comments: cognition improved from yesterday but still with STM deficits- unable to remember personal email address      Exercises      General Comments General comments (skin integrity, edema, etc.): HEP assigned and texted via [place]- tandem stance, SLS, cross midline reaches      Pertinent Vitals/Pain Pain Assessment: No/denies pain    Home Living                      Prior Function            PT Goals (current goals can now be found in the care plan section) Acute Rehab PT Goals Patient Stated Goal: get better and go home PT  Goal Formulation: With patient/family Time For Goal Achievement: 04/10/20 Potential to Achieve Goals: Good Progress towards PT goals: Progressing toward goals    Frequency    Min 3X/week      PT Plan Current plan remains appropriate    Co-evaluation              AM-PAC PT "6 Clicks" Mobility   Outcome Measure  Help needed turning from your back to your side while in a flat bed without using bedrails?: None Help needed moving from lying on your back to sitting on the side of a flat bed without using bedrails?: None Help needed moving to and  from a bed to a chair (including a wheelchair)?: None Help needed standing up from a chair using your arms (e.g., wheelchair or bedside chair)?: None Help needed to walk in hospital room?: A Little Help needed climbing 3-5 steps with a railing? : A Little 6 Click Score: 22    End of Session Equipment Utilized During Treatment: Gait belt Activity Tolerance: Patient tolerated treatment well Patient left: in chair;with call bell/phone within reach   PT Visit Diagnosis: Unsteadiness on feet (R26.81);History of falling (Z91.81)     Time: 9728-2060 PT Time Calculation (min) (ACUTE ONLY): 13 min  Charges:  $Gait Training: 8-22 mins                     Windell Norfolk, DPT, PN1   Supplemental Physical Therapist [place]    Pager [phone] Acute Rehab Office [phone]

## 2020-03-28 NOTE — Discharge Summary (Signed)
Physician Discharge Summary  [name] IFO:[id number] DOB: 09-Jun-1951 DOA: 03/26/2020  PCP: Debbrah Alar, NP  Admit date: 03/26/2020 Discharge date: 03/28/2020  Admitted From: Home Discharge disposition: Home with outpatient PT   Code Status: Full Code  Diet Recommendation: Cardiac/diabetic  Discharge Diagnosis:   Active Problems:   Hypoglycemia due to insulin   Hypoglycemia   Depressed left ventricular ejection fraction   History of Present Illness / Brief narrative:  [name] Lamagna Corneliusis a 69 y.o.malewith PMH significant for IDDM HTN, HLD, OSA, CADstatus post CABG 2019. Patient presented to the ED on 8/1 with altered mental status and fall. Patient's wife noted that he woke up that morning tossing and turning, both eyes open and looking straight forward but not responding to voice or physical stimulus. He got up and went out of bed but collapsed after only few steps. Wife was able to hold him down.   EMS noted blood glucose low at 40, improved to 118 with D50.  Blood pressure 140/90, heart rate 90.   In the ED, blood glucose level stabilized but patient continued to remain disoriented. CT head negative for structural changes or bleeding.  Neurology consultation was obtained. Patient was admitted to hospitalist service for further evaluation management.  Hospital Course:  Acute metabolic encephalopathy with retrospective global amnesia -Likely secondary to prolonged hypoglycemic event. -Normal TSH and cortisol level. -MRI brain-no acute infarction -EEG showed moderate diffuse encephalopathy but no epileptiform activity. -Initially patient was disoriented for hours.  Mental status gradually improved to normal. -Neurology consult appreciated.  Hypoglycemia Insulin-dependent diabetes mellitus Recent Labs       Lab Results  Component Value Date   HGBA1C 7.2 (H) 03/23/2020    -Blood sugar level low at 40 at home.  Stabilized with dextrose. -Home  meds include Levemir 50 units at bedtime, NovoLog 11 to 15 units before supper, Farxiga 5 mg daily, Metformin 1000 mg twice daily. -In the hospital, all diabetes medications were held.  In last 24 hours, his blood sugar readings have been consistently under 170.  -I advised him to resume Levemir tonight at 25 minutes (half dose).  Continue to monitor Accu-Cheks during the day tomorrow.  Gradually increase the dose to normal and reinitiate other medicines. -I sent a message to his primary care provider as well.  Patient will benefit from a follow-up with PCP this week or early next week.  Chronic compensated systolic CHF Essential hypertension -Clinically not in exacerbation.  No pedal edema. -Although chest x-ray on admission states sspected pulmonary edema, patient is clinically not in respiratory distress.  He was not given any diuretic and remains clinically stable.  -progression of cardiomyopathy compared to prior echos. -September 2020, EF 45 to 50%. -03/27/2020, AF 25 to 30%. -Cardiology consulted.  May benefit from left and right heart cath as an outpatient. -Home meds include Lopressor 50 mg twice daily, Lasix 20 mg daily, hydralazine 25 mg 3 times daily. -Medications adjusted by cardiology at discharge: change metoprolol tartrate 50 mg BID to metoprolol succinate 50 mg daily. Add 25 mg losartan.  CADstatus post CABG 2019 HLD New onset LBBB -Patient denied any chest pains and 2 sets troponin remain negative, he hadtriple bypass in 2019, x-ray report as there are some lung congestion and cardiomegaly. However denied any chest pain or short of breath -echocardiogram ordered. -Continue Aspirin 325 mg daily, Crestor 20 mg daily,  Marijuana use -THC positive in urine drug screen.  Counseled to quit.  Mobility: Encourage ambulation.  PT eval obtained.  Outpatient follow-up recommended. Code Status:  Code Status: Full Code    Wound care:    Subjective:  Seen and examined this  morning.  Pleasant elderly African-American male. Sitting up in chair.  Not in distress.  Wants to go home.   Discharge Exam:   Vitals:   03/27/20 1549 03/27/20 2028 03/28/20 0752 03/28/20 1557  BP: (!) 151/94 (!) 160/94 (!) 155/71 (!) 157/93  Pulse: 64 63 (!) 56 (!) 57  Resp: 16 20 14 14   Temp: 98.8 F (37.1 C) 98.3 F (36.8 C) 98.7 F (37.1 C) 98.3 F (36.8 C)  TempSrc:      SpO2: 98% 97% 93% 97%  Weight:      Height:        Body mass index is 20.57 kg/m.  General exam: Appears calm and comfortable.  Not in physical distress Skin: No rashes, lesions or ulcers. HEENT: Atraumatic, normocephalic, supple neck, no obvious bleeding Lungs: Clear to auscultate bilaterally CVS: Regular rate and rhythm, no murmur GI/Abd soft, nontender, nondistended, bowel sound present CNS: Alert,, awake, oriented x3 Psychiatry: Mood appropriate Extremities: No edema, no calf tenderness  Follow ups:   Discharge Instructions    Diet - low sodium heart healthy   Complete by: As directed    Diet Carb Modified   Complete by: As directed    Increase activity slowly   Complete by: As directed       Follow-up Information    Tobb, Kardie, DO Follow up on 04/05/2020.   Specialty: Cardiology Why: Please arrive 15 minutes early for your 3pm post-hospital cardiology follow-up appointment. You will be seeing one of Dr. Wendy Poet partners for this visit.  Contact information: [address] [place] [address] [phone]        Debbrah Alar, NP Follow up in 3 day(s).   Specialty: Internal Medicine Contact information: [address] [place] [address] [phone]        Park Liter, MD .   Specialty: Cardiology Contact information: [address] [place] [address] [phone]               Recommendations for Outpatient Follow-Up:   1. Follow-up with PCP as an outpatient 2. Follow-up with cardiology as an  outpatient  Discharge Instructions:  Follow with Primary MD Debbrah Alar, NP in 7 days   Get CBC/BMP checked in next visit within 1 week by PCP or SNF MD ( we routinely change or add medications that can affect your baseline labs and fluid status, therefore we recommend that you get the mentioned basic workup next visit with your PCP, your PCP may decide not to get them or add new tests based on their clinical decision)  On your next visit with your PCP, please Get Medicines reviewed and adjusted.  Please request your PCP  to go over all Hospital Tests and Procedure/Radiological results at the follow up, please get all Hospital records sent to your Prim MD by signing hospital release before you go home.  Activity: As tolerated with Full fall precautions use walker/cane & assistance as needed  For Heart failure patients - Check your Weight same time everyday, if you gain over 2 pounds, or you develop in leg swelling, experience more shortness of breath or chest pain, call your Primary MD immediately. Follow Cardiac Low Salt Diet and 1.5 lit/day fluid restriction.  If you have smoked or chewed Tobacco in the last  2 yrs please stop smoking, stop any regular Alcohol  and or any Recreational drug use.  If you experience worsening of your admission symptoms, develop shortness of breath, life threatening emergency, suicidal or homicidal thoughts you must seek medical attention immediately by calling 911 or calling your MD immediately  if symptoms less severe.  You Must read complete instructions/literature along with all the possible adverse reactions/side effects for all the Medicines you take and that have been prescribed to you. Take any new Medicines after you have completely understood and accpet all the possible adverse reactions/side effects.   Do not drive, operate heavy machinery, perform activities at heights, swimming or participation in water activities or provide baby sitting  services if your were admitted for syncope or siezures until you have seen by Primary MD or a Neurologist and advised to do so again.  Do not drive when taking Pain medications.  Do not take more than prescribed Pain, Sleep and Anxiety Medications  Wear Seat belts while driving.   Please note You were cared for by a hospitalist during your hospital stay. If you have any questions about your discharge medications or the care you received while you were in the hospital after you are discharged, you can call the unit and asked to speak with the hospitalist on call if the hospitalist that took care of you is not available. Once you are discharged, your primary care physician will handle any further medical issues. Please note that NO REFILLS for any discharge medications will be authorized once you are discharged, as it is imperative that you return to your primary care physician (or establish a relationship with a primary care physician if you do not have one) for your aftercare needs so that they can reassess your need for medications and monitor your lab values.    Allergies as of 03/28/2020      Reactions   Amlodipine Besylate Other (See Comments)   REACTION: ? caused left axillary nodules/chest flutter   Hydrochlorothiazide Hives   Lisinopril    Hyperkalemia      Medication List    STOP taking these medications   Farxiga 5 MG Tabs tablet Generic drug: dapagliflozin propanediol   furosemide 20 MG tablet Commonly known as: LASIX   hydrALAZINE 25 MG tablet Commonly known as: APRESOLINE   metFORMIN 1000 MG tablet Commonly known as: GLUCOPHAGE   metoprolol tartrate 50 MG tablet Commonly known as: LOPRESSOR     TAKE these medications   aspirin 325 MG EC tablet Take 1 tablet (325 mg total) by mouth daily.   cetirizine 10 MG tablet Commonly known as: ZYRTEC Take 10 mg by mouth daily as needed for allergies.   glucose blood test strip Commonly known as: Prodigy No Coding Blood  Gluc Use as instructed to check blood sugar twice a day.  DX  E11.40   Insulin Pen Needle 31G X 8 MM Misc Commonly known as: B-D ULTRAFINE III SHORT PEN USE AS DIRECTED   Levemir FlexTouch 100 UNIT/ML FlexPen Generic drug: insulin detemir Inject 25 Units into the skin daily. INJECT 50 UNITS INTO THE SKIN DAILY What changed:   how much to take  how to take this  when to take this  additional instructions   losartan 25 MG tablet Commonly known as: Cozaar Take 1 tablet (25 mg total) by mouth daily.   metoprolol succinate 50 MG 24 hr tablet Commonly known as: Toprol XL Take 1 tablet (50 mg total) by mouth daily. Take  with or immediately following a meal.   NovoLOG FlexPen 100 UNIT/ML FlexPen Generic drug: insulin aspart INJECT 11-15 UNITS SUBCUTANEOUSLY DAILY BEFORE SUPPER   rosuvastatin 20 MG tablet Commonly known as: CRESTOR TAKE 1 TABLET BY MOUTH EVERY DAY   tadalafil 20 MG tablet Commonly known as: CIALIS Take 1 tablet (20 mg total) by mouth daily as needed for erectile dysfunction.   VITAMIN B-12 PO Take 1 tablet by mouth daily.       Time coordinating discharge: 35 minutes  The results of significant diagnostics from this hospitalization (including imaging, microbiology, ancillary and laboratory) are listed below for reference.    Procedures and Diagnostic Studies:   DG Chest 1 View  Result Date: 03/26/2020 CLINICAL DATA:  Altered mental status.  Previous CABG. EXAM: CHEST  1 VIEW COMPARISON:  03/31/2018; chest CT-04/26/2019 FINDINGS: Grossly unchanged cardiac silhouette and mediastinal contours post median sternotomy and CABG. The lungs remain hyperexpanded with flattening the diaphragms and thinning of the biapical pulmonary parenchyma. The pulmonary vasculature appears less distinct than present examination with cephalization of flow. No discrete focal airspace opacities. No pleural effusion, though note, the left costophrenic angle is excluded view. No  pneumothorax. No evidence of edema. No acute osseous abnormalities. IMPRESSION: Suspected pulmonary edema superimposed on advanced emphysematous change. Electronically Signed   By: Sandi Mariscal M.D.   On: 03/26/2020 11:10   CT Head Wo Contrast  Result Date: 03/26/2020 CLINICAL DATA:  Altered mental status. EXAM: CT HEAD WITHOUT CONTRAST TECHNIQUE: Contiguous axial images were obtained from the base of the skull through the vertex without intravenous contrast. COMPARISON:  None. FINDINGS: Brain: Mild generalized parenchymal volume loss with commensurate dilatation of the ventricles and sulci. No mass, hemorrhage, edema or other evidence of acute parenchymal abnormality. No extra-axial hemorrhage. Vascular: No hyperdense vessel or unexpected calcification. Skull: Normal. Negative for fracture or focal lesion. Sinuses/Orbits: No acute finding. Other: None. IMPRESSION: Negative head CT. No intracranial mass, hemorrhage or edema. Electronically Signed   By: Franki Cabot M.D.   On: 03/26/2020 11:36   MR Brain Wo Contrast (neuro protocol)  Result Date: 03/26/2020 CLINICAL DATA:  Mental status change, unknown cause. Additional provided: Altered mental status and fall. EXAM: MRI HEAD WITHOUT CONTRAST TECHNIQUE: Multiplanar, multiecho pulse sequences of the brain and surrounding structures were obtained without intravenous contrast. COMPARISON:  CT head 03/27/2019 FINDINGS: Brain: Mild generalized parenchymal atrophy. Mild scattered T2/FLAIR hyperintensity within the cerebral white matter and pons is nonspecific, but consistent with chronic small vessel ischemic disease. There are tiny chronic lacunar infarcts within the left cerebellar hemisphere There is no acute infarct. No evidence of intracranial mass. No chronic intracranial blood products. No extra-axial fluid collection. No midline shift. Vascular: Expected proximal arterial flow voids. Skull and upper cervical spine: No focal marrow lesion. Sinuses/Orbits:  Visualized orbits show no acute finding. Mild ethmoid sinus mucosal thickening. No significant mastoid effusion. Other: 2.2 cm cm T1/T2 hyperintense left parietal scalp lesion consistent with lipoma. IMPRESSION: No evidence of acute intracranial abnormality, including acute infarction. Mild generalized parenchymal atrophy and chronic small vessel ischemic disease. Tiny chronic lacunar infarcts within the left cerebellar hemisphere. Mild ethmoid sinus mucosal thickening. 2.2 cm left parietal scalp lipoma. Electronically Signed   By: Kellie Simmering DO   On: 03/26/2020 16:10   EEG adult  Result Date: 03/26/2020 Lora Havens, MD     03/26/2020  2:03 PM Patient Name: ZAKHAI MEISINGER MRN: [id number] Epilepsy Attending: Lora Havens Referring Physician/Provider: Dr Charlesetta Shanks  Date: 03/26/2020 Duration: 23.36 mins Patient history: 69yo M with ams. EEG to evaluate for seizure. Level of alertness: Awake AEDs during EEG study: None Technical aspects: This EEG study was done with scalp electrodes positioned according to the 10-20 International system of electrode placement. Electrical activity was acquired at a sampling rate of 500Hz  and reviewed with a high frequency filter of 70Hz  and a low frequency filter of 1Hz . EEG data were recorded continuously and digitally stored. Description: No posterior dominant rhythm was seen. EEG showed polymorphic mixed frequencies with predominantly generalized 9-10Hz  alpha activity admixed with 3-5hz  theta-delta slowing.   Hyperventilation and photic stimulation were not performed.   ABNORMALITY -Continuous slow, generalized IMPRESSION: This study is suggestive of moderate diffuse encephalopathy, nonspecific etiology. No seizures or epileptiform discharges were seen throughout the recording. Lora Havens   ECHOCARDIOGRAM COMPLETE  Result Date: 03/28/2020    ECHOCARDIOGRAM REPORT   Patient Name:   MAMOUDOU MULVEHILL Date of Exam: 03/27/2020 Medical Rec #:  [id number]           Height:       76.0 in Accession #:    [id number]         Weight:       169.0 lb Date of Birth:  [birth year]         BSA:          2.062 m Patient Age:    21 years           BP:           157/98 mmHg Patient Gender: M                  HR:           80 bpm. Exam Location:  Inpatient Procedure: 2D Echo, Cardiac Doppler, Color Doppler and Intracardiac            Opacification Agent Indications:    Abnormal ECG  History:        Patient has prior history of Echocardiogram examinations. Prior                 CABG, COPD, Aortic Valve Disease, Arrythmias:Atrial                 Fibrillation; Risk Factors:Diabetes, Dyslipidemia and                 Hypertension. S/p AVR. 29mm [employer].                 Aortic Valve: 23 mm [facility] Ease valve is present in the                 aortic position. Procedure Date: 03/04/2018.  Sonographer:    Clayton Lefort RDCS (AE) Referring Phys: [id number] [facility]  Sonographer Comments: Technically challenging study due to limited acoustic windows. IMPRESSIONS  1. Left ventricular ejection fraction, by estimation, is 25 to 30%. The left ventricle has severely decreased function. The left ventricle demonstrates regional wall motion abnormalities (see scoring diagram/findings for description). There is moderate left ventricular hypertrophy. Left ventricular diastolic parameters are consistent with Grade I diastolic dysfunction (impaired relaxation).  2. Right ventricular systolic function is normal. The right ventricular size is normal. Tricuspid regurgitation signal is inadequate for assessing PA pressure.  3. The mitral valve is normal in structure. Trivial mitral valve regurgitation. No evidence of mitral stenosis.  4. The aortic valve has been repaired/replaced. Aortic valve regurgitation is not visualized. No aortic  stenosis is present. There is a 23 mm [employer] valve present in the aortic position. Procedure Date: 03/04/2018. Aortic valve mean gradient measures 10.0 mmHg.   5. The inferior vena cava is normal in size with <50% respiratory variability, suggesting right atrial pressure of 8 mmHg. Comparison(s): A prior study was performed on 04/28/2019. Prior images reviewed side by side. LVEF has decreased. Stable function of aortic valve prosthesis. FINDINGS  Left Ventricle: Left ventricular ejection fraction, by estimation, is 25 to 30%. The left ventricle has severely decreased function. The left ventricle demonstrates regional wall motion abnormalities. Definity contrast agent was given IV to delineate the left ventricular endocardial borders. The left ventricular internal cavity size was normal in size. There is moderate left ventricular hypertrophy. Left ventricular diastolic parameters are consistent with Grade I diastolic dysfunction (impaired relaxation).  LV Wall Scoring: The anterior septum is akinetic. The inferior septum is hypokinetic. Right Ventricle: The right ventricular size is normal. No increase in right ventricular wall thickness. Right ventricular systolic function is normal. Tricuspid regurgitation signal is inadequate for assessing PA pressure. Left Atrium: Left atrial size was normal in size. Right Atrium: Right atrial size was normal in size. Pericardium: There is no evidence of pericardial effusion. Mitral Valve: The mitral valve is normal in structure. Normal mobility of the mitral valve leaflets. Trivial mitral valve regurgitation. No evidence of mitral valve stenosis. Tricuspid Valve: The tricuspid valve is normal in structure. Tricuspid valve regurgitation is trivial. No evidence of tricuspid stenosis. Aortic Valve: The aortic valve has been repaired/replaced. Aortic valve regurgitation is not visualized. No aortic stenosis is present. Aortic valve mean gradient measures 10.0 mmHg. Aortic valve peak gradient measures 17.2 mmHg. Aortic valve area, by VTI measures 2.00 cm. There is a 23 mm [employer] valve present in the aortic position. Procedure  Date: 03/04/2018. Pulmonic Valve: The pulmonic valve was not well visualized. Pulmonic valve regurgitation is trivial. No evidence of pulmonic stenosis. Aorta: The aortic root is normal in size and structure. Venous: The inferior vena cava is normal in size with less than 50% respiratory variability, suggesting right atrial pressure of 8 mmHg. IAS/Shunts: No atrial level shunt detected by color flow Doppler.  LEFT VENTRICLE PLAX 2D LVIDd:         5.30 cm LVIDs:         4.20 cm LV PW:         1.40 cm LV IVS:        1.40 cm LVOT diam:     2.30 cm LV SV:         80 LV SV Index:   39 LVOT Area:     4.15 cm  RIGHT VENTRICLE            IVC RV Basal diam:  3.50 cm    IVC diam: 1.90 cm RV Mid diam:    1.90 cm RV S prime:     8.70 cm/s TAPSE (M-mode): 1.2 cm LEFT ATRIUM             Index       RIGHT ATRIUM           Index LA diam:        3.50 cm 1.70 cm/m  RA Area:     19.70 cm LA Vol (A2C):   57.0 ml 27.64 ml/m RA Volume:   61.30 ml  29.73 ml/m LA Vol (A4C):   32.4 ml 15.71 ml/m LA Biplane Vol: 44.7 ml 21.68 ml/m  AORTIC VALVE AV Area (Vmax):    2.14 cm AV Area (Vmean):   2.11 cm AV Area (VTI):     2.00 cm AV Vmax:           207.20 cm/s AV Vmean:          134.400 cm/s AV VTI:            0.397 m AV Peak Grad:      17.2 mmHg AV Mean Grad:      10.0 mmHg LVOT Vmax:         106.72 cm/s LVOT Vmean:        68.400 cm/s LVOT VTI:          0.191 m LVOT/AV VTI ratio: 0.48  AORTA Ao Root diam: 3.50 cm  SHUNTS Systemic VTI:  0.19 m Systemic Diam: 2.30 cm Cherlynn Kaiser MD Electronically signed by Cherlynn Kaiser MD Signature Date/Time: 03/28/2020/1:30:44 AM    Final      Labs:   Basic Metabolic Panel: Recent Labs  Lab 03/23/20 1129 03/23/20 1129 03/26/20 0924 03/26/20 0924 03/26/20 1152 03/28/20 0135  NA 140  --  142  --  140 140  K 3.8   < > 3.5   < > 4.4 3.8  CL 104  --  104  --   --  105  CO2 27  --  26  --   --  25  GLUCOSE 70  --  66*  --   --  146*  BUN 16  --  9  --   --  6*  CREATININE 0.81  --   0.84  --   --  0.87  CALCIUM 9.4  --  9.4  --   --  8.5*   < > = values in this interval not displayed.   GFR Estimated Creatinine Clearance: 88.2 mL/min (by C-G formula based on SCr of 0.87 mg/dL). Liver Function Tests: Recent Labs  Lab 03/26/20 0924  AST 22  ALT 13  ALKPHOS 46  BILITOT 0.6  PROT 7.5  ALBUMIN 4.2   Recent Labs  Lab 03/26/20 0924  LIPASE 62*   Recent Labs  Lab 03/26/20 1140  AMMONIA 11   Coagulation profile No results for input(s): INR, PROTIME in the last 168 hours.  CBC: Recent Labs  Lab 03/26/20 0924 03/26/20 1152 03/28/20 0135  WBC 10.5  --  7.5  NEUTROABS  --   --  4.8  HGB 16.1 18.0* 14.5  HCT 54.9* 53.0* 48.0  MCV 75.7*  --  73.2*  PLT 478*  --  404*   Cardiac Enzymes: Recent Labs  Lab 03/26/20 1140  CKTOTAL 85   BNP: Invalid input(s): POCBNP CBG: Recent Labs  Lab 03/27/20 1549 03/27/20 2027 03/28/20 0753 03/28/20 1221 03/28/20 1555  GLUCAP 136* 159* 142*   142* 162* 141*   D-Dimer No results for input(s): DDIMER in the last 72 hours. Hgb A1c No results for input(s): HGBA1C in the last 72 hours. Lipid Profile No results for input(s): CHOL, HDL, LDLCALC, TRIG, CHOLHDL, LDLDIRECT in the last 72 hours. Thyroid function studies Recent Labs    03/26/20 2154  TSH 0.574   Anemia work up No results for input(s): VITAMINB12, FOLATE, FERRITIN, TIBC, IRON, RETICCTPCT in the last 72 hours. Microbiology Recent Results (from the past 240 hour(s))  SARS Coronavirus 2 by RT PCR (hospital order, performed in [facility] hospital lab) Nasopharyngeal Nasopharyngeal Swab     Status: None   Collection Time:  03/26/20 11:40 AM   Specimen: Nasopharyngeal Swab  Result Value Ref Range Status   SARS Coronavirus 2 NEGATIVE NEGATIVE Final    Comment: (NOTE) SARS-CoV-2 target nucleic acids are NOT DETECTED.  The SARS-CoV-2 RNA is generally detectable in upper and lower respiratory specimens during the acute phase of infection. The  lowest concentration of SARS-CoV-2 viral copies this assay can detect is 250 copies / mL. A negative result does not preclude SARS-CoV-2 infection and should not be used as the sole basis for treatment or other patient management decisions.  A negative result may occur with improper specimen collection / handling, submission of specimen other than nasopharyngeal swab, presence of viral mutation(s) within the areas targeted by this assay, and inadequate number of viral copies (<250 copies / mL). A negative result must be combined with clinical observations, patient history, and epidemiological information.  Fact Sheet for Patients:   StrictlyIdeas.no  Fact Sheet for Healthcare Providers: BankingDealers.co.za  This test is not yet approved or  cleared by the [place] FDA and has been authorized for detection and/or diagnosis of SARS-CoV-2 by FDA under an Emergency Use Authorization (EUA).  This EUA will remain in effect (meaning this test can be used) for the duration of the COVID-19 declaration under Section 564(b)(1) of the Act, 21 U.S.C. section 360bbb-3(b)(1), unless the authorization is terminated or revoked sooner.  Performed at [facility], [place] [address], [place], [place] [address]      Signed: Terrilee Croak  [facility] 03/28/2020, 5:17 PM

## 2020-03-28 NOTE — Consult Note (Addendum)
Cardiology Consultation:   Patient ID: [name] MRN: [id number]; DOB: April 28, 1951  Admit date: 03/26/2020 Date of Consult: 03/28/2020  Primary Care Provider: Debbrah Alar, NP [facility] Cardiologist: Jenne Campus, MD  [facility] Electrophysiologist:  None    Patient Profile:   [name] is a 69 y.o. male with a hx of CAD and AS s/p CABG x 1 and AVR (2019), HTN, cardiomyopathy, DM, COPD and chronic respiratory failure on home O2  who is being seen today for the evaluation of reduced EF at the request of Dr. Pietro Cassis.  History of Present Illness:   [name] established care with cardiology for heart murmur found to have critical AS. Right and left heart cath confirmed AS and revealed LAD stenosis of 65%. He ultimately underwent CABG x 1 (LIMA-LAD) and AVR (tissue valve) on 03/04/18. Course was complicated by post-op Afib treated with BB. He remains on 50 mg lopressor BID and is not anticoagulated. Follow up echo 04/2019 with good valve function and mildly reduced EF of 45-50%. He follows with Dr. Agustin Cree and was last seen in clinic on 08/09/19 and was doing well at that time.   He presented to [facility] with AMS and hypoglycemia with a BG of 40 when EMS arrived on 03/26/20. Neurology was consulted for abnormal behavior and confusion after glucose stabilized. They determined this was consistent with residual encephalopathy following hypoglycemic event. Although, he may have underlying subclinical dementia. It was thought that he took an extra dose of levimir, although his is just a guess.   EKG with new LBBB and echocardiogram was ordered. Echo showed newly reduced EF of 25-30%, moderate LVH, and good aortic valve prosthesis function. Cardiology was consulted.   On my interview, he denies any signs or symptoms of volume overload. He denies anginal symptoms, including chest pain. Breathing is at baseline on 2L O2 at night only. He denies recent syncope or dizziness  prior to the event leading to this hospitalization. He has been well and free of recent illness. No palpitations or rapid heart beat.    Prior EKG 03/20/2018 with TWI inferior leads and V4-6 EKG on presentation 03/26/20 with new LBBB and TWI in inferior leads and V4-6 Repeat EKG today with TWI anterior leads   Past Medical History:  Diagnosis Date   Abnormal myocardial perfusion study 2006   EF 44% ? inferoseptal ischemia. no cath done   Breast mass, right 03/2008   Contact dermatitis 01/31/2013   COPD (chronic obstructive pulmonary disease) ([place])    "dx'd 12/2017"   Coronary artery disease    Heart murmur    History of colonic polyps    Hyperlipidemia    Hypertension    Low back pain    Oxygen deficiency    uses 2L at night   Type II diabetes mellitus ([place])     Past Surgical History:  Procedure Laterality Date   ABDOMINAL AORTOGRAM N/A 01/13/2018   Procedure: ABDOMINAL AORTOGRAM;  Surgeon: Troy Sine, MD;  Location: [place] [facility];  Service: Cardiovascular;  Laterality: N/A;   AORTIC VALVE REPLACEMENT N/A 03/04/2018   Procedure: AORTIC VALVE REPLACEMENT (AVR) 14mm Edwards Magna Ease Tissue Valve.;  Surgeon: Melrose Nakayama, MD;  Location: [place];  Service: Open Heart Surgery;  Laterality: N/A;   CARDIAC VALVE REPLACEMENT     COLONOSCOPY W/ POLYPECTOMY     COLONOSCOPY WITH PROPOFOL N/A 02/22/2020   Procedure: COLONOSCOPY WITH PROPOFOL;  Surgeon: Doran Stabler, MD;  Location:  [facility];  Service: Gastroenterology;  Laterality: N/A;   CORONARY ARTERY BYPASS GRAFT N/A 03/04/2018   Procedure: CORONARY ARTERY BYPASS GRAFTING (CABG) x1:  LIMA to LAD.;  Surgeon: Melrose Nakayama, MD;  Location: [place];  Service: Open Heart Surgery;  Laterality: N/A;   HEMOSTASIS CLIP PLACEMENT  02/22/2020   Procedure: HEMOSTASIS CLIP PLACEMENT;  Surgeon: Doran Stabler, MD;  Location: [facility];  Service: Gastroenterology;;   LIPOMA EXCISION Left 07/2008     "back of shoulder" Dr Gershon Crane   POLYPECTOMY  02/22/2020   Procedure: POLYPECTOMY;  Surgeon: Doran Stabler, MD;  Location: [facility];  Service: Gastroenterology;;   RIGHT/LEFT HEART CATH AND CORONARY ANGIOGRAPHY N/A 01/13/2018   Procedure: RIGHT/LEFT HEART CATH AND CORONARY ANGIOGRAPHY;  Surgeon: Troy Sine, MD;  Location: [place] [facility];  Service: Cardiovascular;  Laterality: N/A;   TEE WITHOUT CARDIOVERSION N/A 03/04/2018   Procedure: TRANSESOPHAGEAL ECHOCARDIOGRAM (TEE);  Surgeon: Melrose Nakayama, MD;  Location: [place];  Service: Open Heart Surgery;  Laterality: N/A;   TONSILLECTOMY     WISDOM TOOTH EXTRACTION       Home Medications:  Prior to Admission medications   Medication Sig Start Date End Date Taking? Authorizing Provider  aspirin EC 325 MG EC tablet Take 1 tablet (325 mg total) by mouth daily. 03/11/18  Yes Gold, Wayne E, PA-C  cetirizine (ZYRTEC) 10 MG tablet Take 10 mg by mouth daily as needed for allergies.   Yes [provider]  Cyanocobalamin (VITAMIN B-12 PO) Take 1 tablet by mouth daily.   Yes [provider]  dapagliflozin propanediol (FARXIGA) 5 MG TABS tablet Take 5 mg by mouth daily. 12/31/19  Yes Debbrah Alar, NP  furosemide (LASIX) 20 MG tablet TAKE 1 TABLET BY MOUTH EVERY DAY Patient taking differently: Take 20 mg by mouth daily.  02/07/20  Yes Park Liter, MD  hydrALAZINE (APRESOLINE) 25 MG tablet TAKE 1 TABLET BY MOUTH THREE TIMES A DAY Patient taking differently: Take 25 mg by mouth 3 (three) times daily.  12/21/19  Yes Debbrah Alar, NP  insulin aspart (NOVOLOG FLEXPEN) 100 UNIT/ML FlexPen INJECT 11-15 UNITS SUBCUTANEOUSLY DAILY BEFORE SUPPER 03/20/20  Yes Debbrah Alar, NP  insulin detemir (LEVEMIR FLEXTOUCH) 100 UNIT/ML FlexPen INJECT 50 UNITS INTO THE SKIN DAILY AT 10 PM. Patient taking differently: Inject 50 Units into the skin daily. INJECT 50 UNITS INTO THE SKIN DAILY 12/31/19  Yes Debbrah Alar, NP  metFORMIN (GLUCOPHAGE) 1000 MG tablet Take 1 tablet (1,000 mg total) by mouth 2 (two) times daily with a meal. 12/31/19  Yes Debbrah Alar, NP  metoprolol tartrate (LOPRESSOR) 50 MG tablet Take 1 tablet (50 mg total) by mouth 2 (two) times daily. 03/20/20  Yes Debbrah Alar, NP  rosuvastatin (CRESTOR) 20 MG tablet TAKE 1 TABLET BY MOUTH EVERY DAY 02/14/20  Yes Debbrah Alar, NP  tadalafil (CIALIS) 20 MG tablet Take 1 tablet (20 mg total) by mouth daily as needed for erectile dysfunction. 10/01/19  Yes Debbrah Alar, NP  glucose blood (PRODIGY NO CODING BLOOD GLUC) test strip Use as instructed to check blood sugar twice a day.  DX  E11.40 06/05/16   Debbrah Alar, NP  Insulin Pen Needle (B-D ULTRAFINE III SHORT PEN) 31G X 8 MM MISC USE AS DIRECTED 11/07/16   Debbrah Alar, NP    Inpatient Medications: Scheduled Meds:   stroke: mapping our early stages of recovery book   Does not apply Once   aspirin  325 mg Oral Daily   enoxaparin (LOVENOX) injection  40 mg Subcutaneous Q24H   furosemide  20 mg Oral Daily   insulin aspart  0-9 Units Subcutaneous TID WC   loratadine  10 mg Oral Daily   metoprolol tartrate  50 mg Oral BID   rosuvastatin  20 mg Oral Daily   sodium chloride flush  3 mL Intravenous Once   vitamin B-12  100 mcg Oral Daily   Continuous Infusions:  lactated ringers 150 mL/hr at 03/28/20 0200   PRN Meds: acetaminophen **OR** [DISCONTINUED] acetaminophen (TYLENOL) oral liquid 160 mg/5 mL **OR** [DISCONTINUED] acetaminophen, senna-docusate  Allergies:    Allergies  Allergen Reactions   Amlodipine Besylate Other (See Comments)    REACTION: ? caused left axillary nodules/chest flutter   Hydrochlorothiazide Hives   Lisinopril     Hyperkalemia     Social History:   Social History   Socioeconomic History   Marital status: Married    Spouse name: Not on file   Number of children: Not on file   Years of education:  Not on file   Highest education level: Not on file  Occupational History   Not on file  Tobacco Use   Smoking status: Former Smoker    Packs/day: 1.50    Years: 41.00    Pack years: 61.50    Types: Cigarettes    Quit date: 08/26/2010    Years since quitting: 9.5   Smokeless tobacco: Never Used   Tobacco comment: Quit 2012  Vaping Use   Vaping Use: Never used  Substance and Sexual Activity   Alcohol use: Yes    Comment: occasionally   Drug use: Not Currently   Sexual activity: Not Currently  Other Topics Concern   Not on file  Social History Narrative   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   Married    Former Smoker -  33 pack year history   Alcohol use-no     Drug use-no             Social Determinants of Radio broadcast assistant Strain: Low Risk    Difficulty of Paying Living Expenses: Not hard at all  Food Insecurity: No Food Insecurity   Worried About Charity fundraiser in the Last Year: Never true   Arboriculturist in the Last Year: Never true  Transportation Needs: No Transportation Needs   Lack of Transportation (Medical): No   Lack of Transportation (Non-Medical): No  Physical Activity:    Days of Exercise per Week:    Minutes of Exercise per Session:   Stress:    Feeling of Stress :   Social Connections:    Frequency of Communication with Friends and Family:    Frequency of Social Gatherings with Friends and Family:    Attends Religious Services:    Active Member of Clubs or Organizations:    Attends Music therapist:    Marital Status:   Intimate Partner Violence:    Fear of Current or Ex-Partner:    Emotionally Abused:    Physically Abused:    Sexually Abused:     Family History:    Family History  Problem Relation Age of Onset   Melanoma Father 76       deceased secondary to melanoma   Alzheimer's disease Father    Diabetes Father    Hypertension Mother        alive -25   Colon cancer Neg Hx  Esophageal cancer Neg Hx    Colon polyps Neg Hx    Stomach cancer Neg Hx      ROS:  Please see the history of present illness.   All other ROS reviewed and negative.     Physical Exam/Data:   Vitals:   03/27/20 1549 03/27/20 2028 03/28/20 0752 03/28/20 1557  BP: (!) 151/94 (!) 160/94 (!) 155/71 (!) 157/93  Pulse: 64 63 (!) 56 (!) 57  Resp: 16 20 14 14   Temp: 98.8 F (37.1 C) 98.3 F (36.8 C) 98.7 F (37.1 C) 98.3 F (36.8 C)  TempSrc:      SpO2: 98% 97% 93% 97%  Weight:      Height:        Intake/Output Summary (Last 24 hours) at 03/28/2020 1710 Last data filed at 03/28/2020 0700 Gross per 24 hour  Intake 2338.39 ml  Output 300 ml  Net 2038.39 ml   Last 3 Weights 03/26/2020 03/23/2020 03/23/2020  Weight (lbs) 169 lb 169 lb 169 lb 3.2 oz  Weight (kg) 76.658 kg 76.658 kg 76.749 kg     Body mass index is 20.57 kg/m.  General:  Elderly male NAD HEENT: normal Lymph: no adenopathy Neck: no JVD Endocrine:  No thryomegaly Vascular: No carotid bruits; FA pulses 2+ bilaterally without bruits  Cardiac:  normal S1, S2; RRR; no murmur  Lungs:  clear to auscultation bilaterally, no wheezing, rhonchi or rales  Abd: soft, nontender, no hepatomegaly  Ext: no edema Musculoskeletal:  No deformities, BUE and BLE strength normal and equal Skin: warm and dry  Neuro:  CNs 2-12 intact, no focal abnormalities noted Psych:  Normal affect   EKG:  The EKG was personally reviewed and demonstrates:  Sinus rhythm HR with new LBBB (progression of IVCD) Telemetry:  Telemetry was personally reviewed and demonstrates:  N/A  Relevant CV Studies:  Echo 03/27/20: 1. Left ventricular ejection fraction, by estimation, is 25 to 30%. The  left ventricle has severely decreased function. The left ventricle  demonstrates regional wall motion abnormalities (see scoring  diagram/findings for description). There is moderate  left ventricular hypertrophy. Left ventricular diastolic parameters are    consistent with Grade I diastolic dysfunction (impaired relaxation).  2. Right ventricular systolic function is normal. The right ventricular  size is normal. Tricuspid regurgitation signal is inadequate for assessing  PA pressure.  3. The mitral valve is normal in structure. Trivial mitral valve  regurgitation. No evidence of mitral stenosis.  4. The aortic valve has been repaired/replaced. Aortic valve  regurgitation is not visualized. No aortic stenosis is present. There is a  23 mm [employer] valve present in the aortic position. Procedure  Date: 03/04/2018. Aortic valve mean gradient  measures 10.0 mmHg.  5. The inferior vena cava is normal in size with <50% respiratory  variability, suggesting right atrial pressure of 8 mmHg.   Laboratory Data:  High Sensitivity Troponin:   Recent Labs  Lab 03/26/20 1140 03/26/20 2154  TROPONINIHS 12 12     Chemistry Recent Labs  Lab 03/23/20 1129 03/23/20 1129 03/26/20 0924 03/26/20 1152 03/28/20 0135  NA 140   < > 142 140 140  K 3.8   < > 3.5 4.4 3.8  CL 104  --  104  --  105  CO2 27  --  26  --  25  GLUCOSE 70  --  66*  --  146*  BUN 16  --  9  --  6*  CREATININE 0.81  --  0.84  --  0.87  CALCIUM 9.4  --  9.4  --  8.5*  GFRNONAA  --   --  >60  --  >60  GFRAA  --   --  >60  --  >60  ANIONGAP  --   --  12  --  10   < > = values in this interval not displayed.    Recent Labs  Lab 03/26/20 0924  PROT 7.5  ALBUMIN 4.2  AST 22  ALT 13  ALKPHOS 46  BILITOT 0.6   Hematology Recent Labs  Lab 03/26/20 0924 03/26/20 1152 03/28/20 0135  WBC 10.5  --  7.5  RBC 7.25*  --  6.56*  HGB 16.1 18.0* 14.5  HCT 54.9* 53.0* 48.0  MCV 75.7*  --  73.2*  MCH 22.2*  --  22.1*  MCHC 29.3*  --  30.2  RDW 18.0*  --  16.9*  PLT 478*  --  404*   BNPNo results for input(s): BNP, PROBNP in the last 168 hours.  DDimer No results for input(s): DDIMER in the last 168 hours.   Radiology/Studies:  DG Chest 1 View  Result  Date: 03/26/2020 CLINICAL DATA:  Altered mental status.  Previous CABG. EXAM: CHEST  1 VIEW COMPARISON:  03/31/2018; chest CT-04/26/2019 FINDINGS: Grossly unchanged cardiac silhouette and mediastinal contours post median sternotomy and CABG. The lungs remain hyperexpanded with flattening the diaphragms and thinning of the biapical pulmonary parenchyma. The pulmonary vasculature appears less distinct than present examination with cephalization of flow. No discrete focal airspace opacities. No pleural effusion, though note, the left costophrenic angle is excluded view. No pneumothorax. No evidence of edema. No acute osseous abnormalities. IMPRESSION: Suspected pulmonary edema superimposed on advanced emphysematous change. Electronically Signed   By: Sandi Mariscal M.D.   On: 03/26/2020 11:10   CT Head Wo Contrast  Result Date: 03/26/2020 CLINICAL DATA:  Altered mental status. EXAM: CT HEAD WITHOUT CONTRAST TECHNIQUE: Contiguous axial images were obtained from the base of the skull through the vertex without intravenous contrast. COMPARISON:  None. FINDINGS: Brain: Mild generalized parenchymal volume loss with commensurate dilatation of the ventricles and sulci. No mass, hemorrhage, edema or other evidence of acute parenchymal abnormality. No extra-axial hemorrhage. Vascular: No hyperdense vessel or unexpected calcification. Skull: Normal. Negative for fracture or focal lesion. Sinuses/Orbits: No acute finding. Other: None. IMPRESSION: Negative head CT. No intracranial mass, hemorrhage or edema. Electronically Signed   By: Franki Cabot M.D.   On: 03/26/2020 11:36   MR Brain Wo Contrast (neuro protocol)  Result Date: 03/26/2020 CLINICAL DATA:  Mental status change, unknown cause. Additional provided: Altered mental status and fall. EXAM: MRI HEAD WITHOUT CONTRAST TECHNIQUE: Multiplanar, multiecho pulse sequences of the brain and surrounding structures were obtained without intravenous contrast. COMPARISON:  CT head  03/27/2019 FINDINGS: Brain: Mild generalized parenchymal atrophy. Mild scattered T2/FLAIR hyperintensity within the cerebral white matter and pons is nonspecific, but consistent with chronic small vessel ischemic disease. There are tiny chronic lacunar infarcts within the left cerebellar hemisphere There is no acute infarct. No evidence of intracranial mass. No chronic intracranial blood products. No extra-axial fluid collection. No midline shift. Vascular: Expected proximal arterial flow voids. Skull and upper cervical spine: No focal marrow lesion. Sinuses/Orbits: Visualized orbits show no acute finding. Mild ethmoid sinus mucosal thickening. No significant mastoid effusion. Other: 2.2 cm cm T1/T2 hyperintense left parietal scalp lesion consistent with lipoma. IMPRESSION: No evidence of acute intracranial abnormality, including acute infarction. Mild generalized parenchymal  atrophy and chronic small vessel ischemic disease. Tiny chronic lacunar infarcts within the left cerebellar hemisphere. Mild ethmoid sinus mucosal thickening. 2.2 cm left parietal scalp lipoma. Electronically Signed   By: Kellie Simmering DO   On: 03/26/2020 16:10   EEG adult  Result Date: 03/26/2020 Lora Havens, MD     03/26/2020  2:03 PM Patient Name: [name] MRN: [id number] Epilepsy Attending: Lora Havens Referring Physician/Provider: Dr Charlesetta Shanks Date: 03/26/2020 Duration: 23.36 mins Patient history: 69yo M with ams. EEG to evaluate for seizure. Level of alertness: Awake AEDs during EEG study: None Technical aspects: This EEG study was done with scalp electrodes positioned according to the 10-20 International system of electrode placement. Electrical activity was acquired at a sampling rate of 500Hz  and reviewed with a high frequency filter of 70Hz  and a low frequency filter of 1Hz . EEG data were recorded continuously and digitally stored. Description: No posterior dominant rhythm was seen. EEG showed polymorphic mixed  frequencies with predominantly generalized 9-10Hz  alpha activity admixed with 3-5hz  theta-delta slowing.   Hyperventilation and photic stimulation were not performed.   ABNORMALITY -Continuous slow, generalized IMPRESSION: This study is suggestive of moderate diffuse encephalopathy, nonspecific etiology. No seizures or epileptiform discharges were seen throughout the recording. Lora Havens   ECHOCARDIOGRAM COMPLETE  Result Date: 03/28/2020    ECHOCARDIOGRAM REPORT   Patient Name:   [name] Date of Exam: 03/27/2020 Medical Rec #:  [id number]          Height:       76.0 in Accession #:    [id number]         Weight:       169.0 lb Date of Birth:  1951-05-22         BSA:          2.062 m Patient Age:    74 years           BP:           157/98 mmHg Patient Gender: M                  HR:           80 bpm. Exam Location:  Inpatient Procedure: 2D Echo, Cardiac Doppler, Color Doppler and Intracardiac            Opacification Agent Indications:    Abnormal ECG  History:        Patient has prior history of Echocardiogram examinations. Prior                 CABG, COPD, Aortic Valve Disease, Arrythmias:Atrial                 Fibrillation; Risk Factors:Diabetes, Dyslipidemia and                 Hypertension. S/p AVR. 35mm [employer].                 Aortic Valve: 23 mm [facility] Ease valve is present in the                 aortic position. Procedure Date: 03/04/2018.  Sonographer:    Clayton Lefort RDCS (AE) Referring Phys: [id number] [facility]  Sonographer Comments: Technically challenging study due to limited acoustic windows. IMPRESSIONS  1. Left ventricular ejection fraction, by estimation, is 25 to 30%. The left ventricle has severely decreased function. The left ventricle demonstrates regional wall motion abnormalities (see scoring diagram/findings  for description). There is moderate left ventricular hypertrophy. Left ventricular diastolic parameters are consistent with Grade I diastolic dysfunction  (impaired relaxation).  2. Right ventricular systolic function is normal. The right ventricular size is normal. Tricuspid regurgitation signal is inadequate for assessing PA pressure.  3. The mitral valve is normal in structure. Trivial mitral valve regurgitation. No evidence of mitral stenosis.  4. The aortic valve has been repaired/replaced. Aortic valve regurgitation is not visualized. No aortic stenosis is present. There is a 23 mm [employer] valve present in the aortic position. Procedure Date: 03/04/2018. Aortic valve mean gradient measures 10.0 mmHg.  5. The inferior vena cava is normal in size with <50% respiratory variability, suggesting right atrial pressure of 8 mmHg. Comparison(s): A prior study was performed on 04/28/2019. Prior images reviewed side by side. LVEF has decreased. Stable function of aortic valve prosthesis. FINDINGS  Left Ventricle: Left ventricular ejection fraction, by estimation, is 25 to 30%. The left ventricle has severely decreased function. The left ventricle demonstrates regional wall motion abnormalities. Definity contrast agent was given IV to delineate the left ventricular endocardial borders. The left ventricular internal cavity size was normal in size. There is moderate left ventricular hypertrophy. Left ventricular diastolic parameters are consistent with Grade I diastolic dysfunction (impaired relaxation).  LV Wall Scoring: The anterior septum is akinetic. The inferior septum is hypokinetic. Right Ventricle: The right ventricular size is normal. No increase in right ventricular wall thickness. Right ventricular systolic function is normal. Tricuspid regurgitation signal is inadequate for assessing PA pressure. Left Atrium: Left atrial size was normal in size. Right Atrium: Right atrial size was normal in size. Pericardium: There is no evidence of pericardial effusion. Mitral Valve: The mitral valve is normal in structure. Normal mobility of the mitral valve leaflets.  Trivial mitral valve regurgitation. No evidence of mitral valve stenosis. Tricuspid Valve: The tricuspid valve is normal in structure. Tricuspid valve regurgitation is trivial. No evidence of tricuspid stenosis. Aortic Valve: The aortic valve has been repaired/replaced. Aortic valve regurgitation is not visualized. No aortic stenosis is present. Aortic valve mean gradient measures 10.0 mmHg. Aortic valve peak gradient measures 17.2 mmHg. Aortic valve area, by VTI measures 2.00 cm. There is a 23 mm [employer] valve present in the aortic position. Procedure Date: 03/04/2018. Pulmonic Valve: The pulmonic valve was not well visualized. Pulmonic valve regurgitation is trivial. No evidence of pulmonic stenosis. Aorta: The aortic root is normal in size and structure. Venous: The inferior vena cava is normal in size with less than 50% respiratory variability, suggesting right atrial pressure of 8 mmHg. IAS/Shunts: No atrial level shunt detected by color flow Doppler.  LEFT VENTRICLE PLAX 2D LVIDd:         5.30 cm LVIDs:         4.20 cm LV PW:         1.40 cm LV IVS:        1.40 cm LVOT diam:     2.30 cm LV SV:         80 LV SV Index:   39 LVOT Area:     4.15 cm  RIGHT VENTRICLE            IVC RV Basal diam:  3.50 cm    IVC diam: 1.90 cm RV Mid diam:    1.90 cm RV S prime:     8.70 cm/s TAPSE (M-mode): 1.2 cm LEFT ATRIUM  Index       RIGHT ATRIUM           Index LA diam:        3.50 cm 1.70 cm/m  RA Area:     19.70 cm LA Vol (A2C):   57.0 ml 27.64 ml/m RA Volume:   61.30 ml  29.73 ml/m LA Vol (A4C):   32.4 ml 15.71 ml/m LA Biplane Vol: 44.7 ml 21.68 ml/m  AORTIC VALVE AV Area (Vmax):    2.14 cm AV Area (Vmean):   2.11 cm AV Area (VTI):     2.00 cm AV Vmax:           207.20 cm/s AV Vmean:          134.400 cm/s AV VTI:            0.397 m AV Peak Grad:      17.2 mmHg AV Mean Grad:      10.0 mmHg LVOT Vmax:         106.72 cm/s LVOT Vmean:        68.400 cm/s LVOT VTI:          0.191 m LVOT/AV VTI  ratio: 0.48  AORTA Ao Root diam: 3.50 cm  SHUNTS Systemic VTI:  0.19 m Systemic Diam: 2.30 cm Cherlynn Kaiser MD Electronically signed by Cherlynn Kaiser MD Signature Date/Time: 03/28/2020/1:30:44 AM    Final    {  Assessment and Plan:   New onset systolic heart failure New LBBB - progression of cardiomyopathy from prior echos - home regimen includes 20 mg lasix, hydralazine 25 mg TID, lopressor 50 mg BID, crestor 20 mg - pt does not appear volume overloaded  - denies anginal symptoms and S/S of hypervolemia - CE x 2 negative - EKG does not appear ischemic - will tailor medications for new onset heart failure: continue BB, hydralazine, and add low dose losartan - BP generally runs in the 130s at home - he did have AMS/?loss of consciousness prior to arrival, but thought to be due to hypoglycemia - given his euvolemic state and lack of symptoms, may opt to work this up outpatient with a left and right heart cath - consider placing zio patch  Upon discharge: Change lopressor tartrate 50 mg BID to lopressor succinate 50 mg daily Add 25 mg losartan   Hx of CAD s/p CABG x 1 with LIMA-LAD (2019) - echo following CABG with EF 45-50%, stable on echo 2020 - hs troponin x 2 negative   Critical aortic stenosis s/p AVR (2019 - s/p AVR with tissue valve - good valve function on echo yesterday - no dyspnea, volume overlaod   Hypertension - medications as above - add low dose losartan and keep BP log   Hyperlipidemia 03/23/2020: Cholesterol 127; HDL 50.00; LDL Cholesterol 64; Triglycerides 66.0; VLDL 13.2 Continue crestor 20 mg   Post-op Afib - following CABG/AVR in 2019 - no recurrence, not on anticoagulation - continue BB   IDDM Hypoglycemia - A1c 7.2% - lipase mildly elevated - follow up with PCP     Will arranged close cardiology follow up.     For questions or updates, please contact [place] Please consult [email or web address] for contact info under     Signed, Ledora Bottcher, PA  03/28/2020 5:10 PM    Patient seen and examined. Agree with assessment and plan.  [name] is a very pleasant 69 year old African-American male who is followed by Dr. Agustin Cree.  He developed as of  severe aortic stenosis and underwent cardiac catheterization by me on Jan 13, 2018 which showed mild to moderate CAD with coronary calcification.  There was 60 to 70% stenosis in the LAD after the takeoff of the first diagonal vessel, 30% stenosis in the ramus intermediate vessel proximally, 30% proximal mid circumflex stenosis and 20 and 30% stenoses in the RCA.  He was found to have severe critical aortic stenosis with a peak instantaneous gradient of 80, peak to peak gradient of 56, and mean gradient of 51.  Aortic valve area was 0.6 cm.  He subsequently underwent AVR a 23 mm [facility] Ease tissue valve as well as single-vessel LIMA to LAD bypass.  Subsequently, he did develop transient PAF postoperatively.  An echo Doppler study in 2020 showed an EF of 45 to 50%.  He presented to [facility] setting of profound hypoglycemia with a blood sugar of 40 on March 26, 2020.  Or mental status changes felt due to residual encephalopathy following his hypoglycemic event.  There are some question of whether or not he had taken excess insulin.  His ECG initially showed a new left bundle branch block.  Previously he has had LVH with repolarization changes.  A subsequent ECG after left bundle branch block had resolved showed improvement in his LVH repolarization changes laterally but was mild T wave inversion anteroseptally.  An echo Doppler study done today shows an EF reduced at 25 to 30% with moderate LVH and a well-seated bioprosthetic valve with normal gradients.  The patient is entirely asymptomatic.  He denies shortness of breath.  He denies chest pain.  He is unaware of palpitations.  He has a history of hypertension and blood pressure earlier was 157/93.  Pulse is  regular in the upper 50s to 60s.  HEENT is unremarkable.  There is no JVD.  There are no carotid bruits.  Lungs were clear.  Rhythm was regular with 1/6 systolic murmur.  There is no hepatojugular reflux.  Abdomen soft and nontender.  There is no edema.  Neurologically he is nonfocal.  I had a long discussion with both he and his wife.  His echo Doppler study raises concern for reduction of LV function with EF now 25 to 30%.  The patient transiently was in left bundle branch block associated with septal dyssynergy and perhaps this may be contributing to his mild anteroseptal T wave abnormality.  The patient had a LIMA placed to his LAD that had 65% stenosis.  He denies any episodes of chest pressure or tightness or decline in exertional capacity.  The patient is all dressed and ready to go home today.  I have recommended that we switch metoprolol to tartrate from 50 mg twice daily to metoprolol succinate 50 mg and add losartan initially at 25 mg in light of his reduced LV function.  We have scheduled an appointment to see Dr. Berniece Salines at the [facility] office and he will then follow-up with Dr. Agustin Cree.  It may be worthwhile to repeat the echo Doppler study in the very near future to make certain the current echo is accurate and not related to his recent left bundle branch block rhythm.  If LV function continues to be poor, we did discuss potential right left heart catheterization for definitive further evaluation.   Troy Sine, MD, [facility] 03/28/2020 5:21 PM

## 2020-03-28 NOTE — Plan of Care (Signed)

## 2020-03-29 ENCOUNTER — Telehealth: Payer: Self-pay | Admitting: Family

## 2020-03-29 ENCOUNTER — Telehealth: Payer: Self-pay | Admitting: *Deleted

## 2020-03-29 NOTE — Telephone Encounter (Signed)
1st attempt. Unable to reach patient.   

## 2020-03-29 NOTE — Telephone Encounter (Signed)
Please call pt to schedule TCM follow up.  I am out of the office but hospital MD would like pt seen this Friday or next Monday please.

## 2020-03-30 NOTE — Telephone Encounter (Signed)
I have made two attempts and have been unable to reach patient. Pt has hospital follow up scheduled w/ Dr.Paz  03/31/20 @1120 .

## 2020-03-31 ENCOUNTER — Encounter: Payer: Self-pay | Admitting: Internal Medicine

## 2020-03-31 ENCOUNTER — Ambulatory Visit (INDEPENDENT_AMBULATORY_CARE_PROVIDER_SITE_OTHER): Payer: Medicare Other | Admitting: Internal Medicine

## 2020-03-31 ENCOUNTER — Other Ambulatory Visit (INDEPENDENT_AMBULATORY_CARE_PROVIDER_SITE_OTHER): Payer: Medicare Other

## 2020-03-31 ENCOUNTER — Other Ambulatory Visit: Payer: Self-pay

## 2020-03-31 VITALS — BP 157/79 | HR 61 | Temp 98.1°F | Resp 16 | Ht 76.0 in | Wt 174.5 lb

## 2020-03-31 DIAGNOSIS — I420 Dilated cardiomyopathy: Secondary | ICD-10-CM

## 2020-03-31 DIAGNOSIS — IMO0002 Reserved for concepts with insufficient information to code with codable children: Secondary | ICD-10-CM

## 2020-03-31 DIAGNOSIS — E1165 Type 2 diabetes mellitus with hyperglycemia: Secondary | ICD-10-CM | POA: Diagnosis not present

## 2020-03-31 DIAGNOSIS — E1140 Type 2 diabetes mellitus with diabetic neuropathy, unspecified: Secondary | ICD-10-CM

## 2020-03-31 DIAGNOSIS — I10 Essential (primary) hypertension: Secondary | ICD-10-CM

## 2020-03-31 LAB — CBC WITH DIFFERENTIAL/PLATELET
Basophils Absolute: 0.1 10*3/uL (ref 0.0–0.1)
Basophils Relative: 0.9 % (ref 0.0–3.0)
Eosinophils Absolute: 0.2 10*3/uL (ref 0.0–0.7)
Eosinophils Relative: 2.5 % (ref 0.0–5.0)
HCT: 48.1 % (ref 39.0–52.0)
Hemoglobin: 15.1 g/dL (ref 13.0–17.0)
Lymphocytes Relative: 26.9 % (ref 12.0–46.0)
Lymphs Abs: 1.9 10*3/uL (ref 0.7–4.0)
MCHC: 31.4 g/dL (ref 30.0–36.0)
MCV: 73.3 fl — ABNORMAL LOW (ref 78.0–100.0)
Monocytes Absolute: 0.7 10*3/uL (ref 0.1–1.0)
Monocytes Relative: 9.4 % (ref 3.0–12.0)
Neutro Abs: 4.3 10*3/uL (ref 1.4–7.7)
Neutrophils Relative %: 60.3 % (ref 43.0–77.0)
Platelets: 425 10*3/uL — ABNORMAL HIGH (ref 150.0–400.0)
RBC: 6.55 Mil/uL — ABNORMAL HIGH (ref 4.22–5.81)
RDW: 15.7 % — ABNORMAL HIGH (ref 11.5–15.5)
WBC: 7.2 10*3/uL (ref 4.0–10.5)

## 2020-03-31 LAB — BASIC METABOLIC PANEL
BUN: 8 mg/dL (ref 6–23)
CO2: 29 mEq/L (ref 19–32)
Calcium: 9.5 mg/dL (ref 8.4–10.5)
Chloride: 104 mEq/L (ref 96–112)
Creatinine, Ser: 0.76 mg/dL (ref 0.40–1.50)
GFR: 123.2 mL/min (ref >60.00)
Glucose, Bld: 188 mg/dL — ABNORMAL HIGH (ref 70–99)
Potassium: 3.9 mEq/L (ref 3.5–5.1)
Sodium: 138 mEq/L (ref 135–145)

## 2020-03-31 MED ORDER — HYDRALAZINE HCL 10 MG PO TABS: 10.0000 mg | ORAL_TABLET | Freq: Three times a day (TID) | ORAL | 0 refills | Status: DC

## 2020-03-31 NOTE — Patient Instructions (Addendum)
Diabetes: Endocrinology referral Continue Levemir 25 units Hold NovoLog Go back on Metformin 1000 mg: Half tablet twice  daily  Hypertension: Continue losartan Continue metoprolol Go back on hydralazine,  10 mg tablets three times a day  Check the  blood pressure daily BP GOAL is between 110/65 and  135/85. If it is consistently higher or lower, let me know   Diabetes: Check your blood sugar 2-3 times a day at different times of the day  GOALS: Fasting before a meal 100- 130 2 hours after a meal less than 180 At bedtime 90-150   GO TO THE LAB : Get the blood work     [place], [place] back for   a checkup in 6 weeks Schedule appointment with Endocrinology (Dr Kelton Pillar)  ok to switch providers

## 2020-03-31 NOTE — Progress Notes (Signed)
Subjective:    Patient ID: [name], male    DOB: 01-Jan-1951, 69 y.o.   MRN: [id number]  DOS:  03/31/2020 Type of visit - description: TCM Admitted to the hospital 03/26/2020, discharge on 03/28/2020  He presented with MS changes, have near syncope at home, EMS arrived, CBG was in the 40s, was given D50, no previous episodes of hypoglycemia. At the ER he was noted to be still slightly confused despite increase in blood sugar thus was admitted .  The patient was diagnosed with acute metabolic encephalopathy with retrospective global amnesia. This was likely secondary to prolonged hypoglycemia. MRI brain no acute. Neurology was consulted, EEG showed moderate diffuse encephalopathy but no epileptiform activity. Mental status gradually improved.  He was noted to have a new onset of LBBB, no chest pain no difficulty breathing.  Cardiology was consulted. Echo 03/27/2020: EF 25 to 30%, + regional wall motion abnormality.  No aortic stenosis or regurgitation.   Review of Systems  He is here with his wife. Since he left the hospital he is taking the medications as recommended. Ambulatory BPs and ambulatory CBGs higher than before.  He denies chest pain, difficulty breathing. No lower extremity edema No orthopnea No dysuria, gross hematuria or difficulty urinating.  Past Medical History:  Diagnosis Date  . Abnormal myocardial perfusion study 2006   EF 44% ? inferoseptal ischemia. no cath done  . Breast mass, right 03/2008  . Contact dermatitis 01/31/2013  . COPD (chronic obstructive pulmonary disease) ([place])    "dx'd 12/2017"  . Coronary artery disease   . Heart murmur   . History of colonic polyps   . Hyperlipidemia   . Hypertension   . Low back pain   . Oxygen deficiency    uses 2L at night  . Type II diabetes mellitus ([place])     Past Surgical History:  Procedure Laterality Date  . ABDOMINAL AORTOGRAM N/A 01/13/2018   Procedure: ABDOMINAL AORTOGRAM;  Surgeon: Troy Sine, MD;  Location: [place] [facility];  Service: Cardiovascular;  Laterality: N/A;  . AORTIC VALVE REPLACEMENT N/A 03/04/2018   Procedure: AORTIC VALVE REPLACEMENT (AVR) 45mm Edwards Magna Ease Tissue Valve.;  Surgeon: Melrose Nakayama, MD;  Location: [place];  Service: Open Heart Surgery;  Laterality: N/A;  . CARDIAC VALVE REPLACEMENT    . COLONOSCOPY W/ POLYPECTOMY    . COLONOSCOPY WITH PROPOFOL N/A 02/22/2020   Procedure: COLONOSCOPY WITH PROPOFOL;  Surgeon: Doran Stabler, MD;  Location: [facility];  Service: Gastroenterology;  Laterality: N/A;  . CORONARY ARTERY BYPASS GRAFT N/A 03/04/2018   Procedure: CORONARY ARTERY BYPASS GRAFTING (CABG) x1:  LIMA to LAD.;  Surgeon: Melrose Nakayama, MD;  Location: [place];  Service: Open Heart Surgery;  Laterality: N/A;  . HEMOSTASIS CLIP PLACEMENT  02/22/2020   Procedure: HEMOSTASIS CLIP PLACEMENT;  Surgeon: Doran Stabler, MD;  Location: [facility];  Service: Gastroenterology;;  . LIPOMA EXCISION Left 07/2008    "back of shoulder" Dr Gershon Crane  . POLYPECTOMY  02/22/2020   Procedure: POLYPECTOMY;  Surgeon: Doran Stabler, MD;  Location: [facility];  Service: Gastroenterology;;  . RIGHT/LEFT HEART CATH AND CORONARY ANGIOGRAPHY N/A 01/13/2018   Procedure: RIGHT/LEFT HEART CATH AND CORONARY ANGIOGRAPHY;  Surgeon: Troy Sine, MD;  Location: [place] [facility];  Service: Cardiovascular;  Laterality: N/A;  . TEE WITHOUT CARDIOVERSION N/A 03/04/2018   Procedure: TRANSESOPHAGEAL ECHOCARDIOGRAM (TEE);  Surgeon: Melrose Nakayama, MD;  Location: [place];  Service: Open Heart Surgery;  Laterality: N/A;  . TONSILLECTOMY    . WISDOM TOOTH EXTRACTION      Allergies as of 03/31/2020      Reactions   Amlodipine Besylate Other (See Comments)   REACTION: ? caused left axillary nodules/chest flutter   Hydrochlorothiazide Hives   Lisinopril    Hyperkalemia      Medication List       Accurate as of March 31, 2020 11:59 PM. If you have any  questions, ask your nurse or doctor.        aspirin 325 MG EC tablet Take 1 tablet (325 mg total) by mouth daily.   cetirizine 10 MG tablet Commonly known as: ZYRTEC Take 10 mg by mouth daily as needed for allergies.   glucose blood test strip Commonly known as: Prodigy No Coding Blood Gluc Use as instructed to check blood sugar twice a day.  DX  E11.40   hydrALAZINE 10 MG tablet Commonly known as: APRESOLINE Take 1 tablet (10 mg total) by mouth 3 (three) times daily. Started by: Kathlene November, MD   Insulin Pen Needle 31G X 8 MM Misc Commonly known as: B-D ULTRAFINE III SHORT PEN USE AS DIRECTED   Levemir FlexTouch 100 UNIT/ML FlexPen Generic drug: insulin detemir Inject 25 Units into the skin daily. INJECT 50 UNITS INTO THE SKIN DAILY What changed: additional instructions   losartan 25 MG tablet Commonly known as: Cozaar Take 1 tablet (25 mg total) by mouth daily.   metFORMIN 1000 MG tablet Commonly known as: GLUCOPHAGE Take 0.5 tablets (500 mg total) by mouth 2 (two) times daily with a meal.   metoprolol succinate 50 MG 24 hr tablet Commonly known as: Toprol XL Take 1 tablet (50 mg total) by mouth daily. Take with or immediately following a meal.   NovoLOG FlexPen 100 UNIT/ML FlexPen Generic drug: insulin aspart INJECT 11-15 UNITS SUBCUTANEOUSLY DAILY BEFORE SUPPER What changed: additional instructions   rosuvastatin 20 MG tablet Commonly known as: CRESTOR TAKE 1 TABLET BY MOUTH EVERY DAY   tadalafil 20 MG tablet Commonly known as: CIALIS Take 1 tablet (20 mg total) by mouth daily as needed for erectile dysfunction.   VITAMIN B-12 PO Take 1 tablet by mouth daily.          Objective:   Physical Exam BP (!) 157/79 (BP Location: Right Arm, Patient Position: Sitting, Cuff Size: Small)   Pulse 61   Temp 98.1 F (36.7 C) (Oral)   Resp 16   Ht 6\' 4"  (1.93 m)   Wt 174 lb 8 oz (79.2 kg)   SpO2 94%   BMI 21.24 kg/m  General:   Well developed, NAD, BMI  noted.  HEENT:  Normocephalic . Face symmetric, atraumatic Neck: No JVD Lungs:  CTA B Normal respiratory effort, no intercostal retractions, no accessory muscle use. Heart: RRR,  no murmur.  Abdomen:  Not distended, soft, non-tender. No rebound or rigidity.   Skin: Not pale. Not jaundice Lower extremities: no pretibial edema bilaterally  Neurologic:  alert & oriented X3.  Speech normal, gait appropriate for age and unassisted Psych--  Cognition and judgment appear intact.  Cooperative with normal attention span and concentration.  Behavior appropriate. No anxious or depressed appearing.     Assessment    69 year old gentleman, PMH includes DM, COPD, CAD, CABG and aortic valve replacement due to stenosis on 02/2018, cardiomyopathy, HTN, presents for hospital follow-up  DM: Admitted to the hospital with the first episode of hypoglycemia. Prior  to the admission his regimen was: Farxiga Levemir 50 units NovoLog 15 units at bedtime Metformin 1000 units twice a day. Current regimen: No Farxiga or Metformin Levemir 25 units NovoLog 7 units. Current CBG is 140, 149 (previously 120s) Plan: Endo referral (goal: Good control of DM without low blood sugars) Check CBG goals fasting 100-130. See AVS. Continue Levemir at a reduced dose of 25 units. Hold NovoLog NovoLog Go back to Metformin 1000 mg at a reduced dose of half tablet twice a day  HTN: Currently on losartan (started at the hospital), metoprolol. Lasix and hydralazine were discontinued HTN at home, 196 this morning, BP today here 157/79. On reviewing cardiology note, the intention was to keep him on hydralazine. Plan: Labs Restart hydralazine 10 mg 3 times daily (previously 25 mg 3 times daily) Cardiomyopathy: New LBBB noted at the hospital, echocardiogram showed a decreased EF compared to previous echo. Keep follow-up with cardiology 04/05/2020  RTC 6 weeks   This visit occurred during the SARS-CoV-2 public  health emergency.  Safety protocols were in place, including screening questions prior to the visit, additional usage of staff PPE, and extensive cleaning of exam room while observing appropriate contact time as indicated for disinfecting solutions.

## 2020-03-31 NOTE — Progress Notes (Signed)
Pre visit review using our clinic review tool, if applicable. No additional management support is needed unless otherwise documented below in the visit note. 

## 2020-04-01 ENCOUNTER — Encounter: Payer: Self-pay | Admitting: Internal Medicine

## 2020-04-05 ENCOUNTER — Other Ambulatory Visit: Payer: Self-pay

## 2020-04-05 ENCOUNTER — Encounter: Payer: Self-pay | Admitting: *Deleted

## 2020-04-05 ENCOUNTER — Ambulatory Visit (INDEPENDENT_AMBULATORY_CARE_PROVIDER_SITE_OTHER): Payer: Medicare Other | Admitting: Cardiology

## 2020-04-05 VITALS — BP 140/94 | HR 76 | Ht 76.0 in | Wt 174.0 lb

## 2020-04-05 DIAGNOSIS — R0989 Other specified symptoms and signs involving the circulatory and respiratory systems: Secondary | ICD-10-CM | POA: Diagnosis not present

## 2020-04-05 DIAGNOSIS — I480 Paroxysmal atrial fibrillation: Secondary | ICD-10-CM

## 2020-04-05 DIAGNOSIS — I2510 Atherosclerotic heart disease of native coronary artery without angina pectoris: Secondary | ICD-10-CM | POA: Diagnosis not present

## 2020-04-05 DIAGNOSIS — E782 Mixed hyperlipidemia: Secondary | ICD-10-CM | POA: Diagnosis not present

## 2020-04-05 DIAGNOSIS — Z952 Presence of prosthetic heart valve: Secondary | ICD-10-CM | POA: Diagnosis not present

## 2020-04-05 DIAGNOSIS — I10 Essential (primary) hypertension: Secondary | ICD-10-CM | POA: Diagnosis not present

## 2020-04-05 DIAGNOSIS — Z951 Presence of aortocoronary bypass graft: Secondary | ICD-10-CM

## 2020-04-05 MED ORDER — SPIRONOLACTONE 25 MG PO TABS
12.5000 mg | ORAL_TABLET | Freq: Every day | ORAL | 3 refills | Status: DC
Start: 2020-04-05 — End: 2020-09-05

## 2020-04-05 NOTE — Progress Notes (Signed)
Cardiology Office Note:    Date:  04/05/2020   ID:  [name], DOB September 23, 1950, MRN [id number]  PCP:  Colon Branch, MD  Cardiologist:  Jenne Campus, MD  Electrophysiologist:  None   Referring MD: Debbrah Alar, NP   Chief Complaint  Patient presents with  . Hospitalization Follow-up    History of Present Illness:    [name] is a 69 y.o. male with a hx of coronary artery disease status post CABG in 2019, ischemic cardiomyopathy most recent EF on March 27, 2020 at which time his EF was reported to be 25 to 30%, Status post aortic valve replacement with a 23 Edwards valve also in July 2019, hypertension hyperlipidemia and diabetes.  The patient was admitted at the [facility] and discharged on March 27, 2020.  He was treated for hypoglycemia.  While in the hospital he reported significant fatigue his echocardiogram was repeated which showed a depressed ejection fraction compared to prior.  Post hospitalization he was asked to see his primary cardiologist.  He is in the office with his wife today he reports that he still feels a bit fatigued.  He denies any chest pain, or shortness of breath.  Past Medical History:  Diagnosis Date  . Abnormal myocardial perfusion study 2006   EF 44% ? inferoseptal ischemia. no cath done  . Benign neoplasm of cecum   . Breast mass, right 03/2008  . Cardiomyopathy ([place]) 07/20/2018   Ejection fraction 4045% in September 2019  . Centrilobular emphysema ([place]) 08/25/2018   02/04/2018-high-res CT- no evidence of interstitial lung disease, severe centrilobular emphysema and mild diffuse bronchial wall thickening suggesting COPD, no calcified pleural plaques no pleural effusion, mild smooth bilateral pleural thickening which is nonspecific  02/04/2018-pulmonary function test- FVC 3.37 (71% predicted), postbronchodilator ratio 50, postbronchodilator FEV1 54, mid flow reve  . COLONIC POLYPS, HX OF 03/18/2007   Qualifier: Diagnosis  of  By: [name]   . Contact dermatitis 01/31/2013  . COPD (chronic obstructive pulmonary disease) ([place])    "dx'd 12/2017"  . COPD GOLD II A  08/25/2018   02/04/2018-high-res CT- no evidence of interstitial lung disease, severe centrilobular emphysema and mild diffuse bronchial wall thickening suggesting COPD, no calcified pleural plaques no pleural effusion, mild smooth bilateral pleural thickening which is nonspecific  02/04/2018-pulmonary function test- FVC 3.37 (71% predicted), postbronchodilator ratio 50, postbronchodilator FEV1 54, mid flow reve  . Coronary artery disease   . Critical aortic valve stenosis 01/12/2018   Calculated valve area less than 0.5 cm mean gradient more than 60 mm of mercury  . DEGENERATIVE JOINT DISEASE, BACK 03/18/2007   Qualifier: Diagnosis of  By: [name]   . Depressed left ventricular ejection fraction 03/28/2020  . Dyspnea on exertion 01/12/2018  . ERECTILE DYSFUNCTION, ORGANIC 02/10/2009   Qualifier: Diagnosis of  By: [name]   . Heart murmur   . HEMORRHOIDS, INTERNAL 03/18/2007   Qualifier: Diagnosis of  By: [name]   . Hyperlipemia 10/21/2007   Qualifier: Diagnosis of  By: [name]   . Hypertension   . Hypoglycemia due to insulin 03/26/2020  . Low back pain   . MYOCARDIAL PERFUSION SCAN, WITH STRESS TEST, ABNORMAL 08/11/2008   Qualifier: Diagnosis of  By: Johnsie Cancel, MD, Rona Ravens   . Nocturnal hypoxemia 08/25/2018   04/09/18 >>> ONO - telephone note>>> Patient's ONO on room air showed that he was less than  88% for more than an hour. Patient needs to be on 2 L at HS.  10/06/2018-overnight oximetry-entire duration was 4 hours and 23 minutes, SPO2 less than 88% for 25 minutes and 32 seconds    . Oxygen deficiency    uses 2L at night  . Paroxysmal atrial fibrillation ([place]) 10/12/2018  . S/P AVR 03/04/2018   AVR 23 mm Edwards Magna Valve - bioprostetic 03/04/2018 LIMA - LAD  . Status post coronary artery bypass graft  03/20/2018   LIMA to LAD during aortic valve replacement surgery 03/04/2018  . Type 2 diabetes mellitus, uncontrolled, with neuropathy ([place]) 05/20/2008   Qualifier: Diagnosis of  By: [name]     Past Surgical History:  Procedure Laterality Date  . ABDOMINAL AORTOGRAM N/A 01/13/2018   Procedure: ABDOMINAL AORTOGRAM;  Surgeon: Troy Sine, MD;  Location: [place] [facility];  Service: Cardiovascular;  Laterality: N/A;  . AORTIC VALVE REPLACEMENT N/A 03/04/2018   Procedure: AORTIC VALVE REPLACEMENT (AVR) 30mm Edwards Magna Ease Tissue Valve.;  Surgeon: Melrose Nakayama, MD;  Location: [place];  Service: Open Heart Surgery;  Laterality: N/A;  . CARDIAC VALVE REPLACEMENT    . COLONOSCOPY W/ POLYPECTOMY    . COLONOSCOPY WITH PROPOFOL N/A 02/22/2020   Procedure: COLONOSCOPY WITH PROPOFOL;  Surgeon: Doran Stabler, MD;  Location: [facility];  Service: Gastroenterology;  Laterality: N/A;  . CORONARY ARTERY BYPASS GRAFT N/A 03/04/2018   Procedure: CORONARY ARTERY BYPASS GRAFTING (CABG) x1:  LIMA to LAD.;  Surgeon: Melrose Nakayama, MD;  Location: [place];  Service: Open Heart Surgery;  Laterality: N/A;  . HEMOSTASIS CLIP PLACEMENT  02/22/2020   Procedure: HEMOSTASIS CLIP PLACEMENT;  Surgeon: Doran Stabler, MD;  Location: [facility];  Service: Gastroenterology;;  . LIPOMA EXCISION Left 07/2008    "back of shoulder" Dr Gershon Crane  . POLYPECTOMY  02/22/2020   Procedure: POLYPECTOMY;  Surgeon: Doran Stabler, MD;  Location: [facility];  Service: Gastroenterology;;  . RIGHT/LEFT HEART CATH AND CORONARY ANGIOGRAPHY N/A 01/13/2018   Procedure: RIGHT/LEFT HEART CATH AND CORONARY ANGIOGRAPHY;  Surgeon: Troy Sine, MD;  Location: [place] [facility];  Service: Cardiovascular;  Laterality: N/A;  . TEE WITHOUT CARDIOVERSION N/A 03/04/2018   Procedure: TRANSESOPHAGEAL ECHOCARDIOGRAM (TEE);  Surgeon: Melrose Nakayama, MD;  Location: [place];  Service: Open Heart Surgery;  Laterality:  N/A;  . TONSILLECTOMY    . WISDOM TOOTH EXTRACTION      Current Medications: Current Meds  Medication Sig  . aspirin EC 325 MG EC tablet Take 1 tablet (325 mg total) by mouth daily.  . cetirizine (ZYRTEC) 10 MG tablet Take 10 mg by mouth daily as needed for allergies.   . Cyanocobalamin (VITAMIN B-12 PO) Take 1 tablet by mouth daily.  Marland Kitchen glucose blood (PRODIGY NO CODING BLOOD GLUC) test strip Use as instructed to check blood sugar twice a day.  DX  E11.40  . hydrALAZINE (APRESOLINE) 10 MG tablet Take 1 tablet (10 mg total) by mouth 3 (three) times daily.  . insulin aspart (NOVOLOG FLEXPEN) 100 UNIT/ML FlexPen INJECT 11-15 UNITS SUBCUTANEOUSLY DAILY BEFORE SUPPER (Patient taking differently: INJECT 7 UNITS SUBCUTANEOUSLY DAILY BEFORE SUPPER)  . insulin detemir (LEVEMIR FLEXTOUCH) 100 UNIT/ML FlexPen Inject 25 Units into the skin daily. INJECT 50 UNITS INTO THE SKIN DAILY (Patient taking differently: Inject 25 Units into the skin daily. )  . Insulin Pen Needle (B-D ULTRAFINE III SHORT PEN) 31G X 8 MM MISC USE  AS DIRECTED  . losartan (COZAAR) 25 MG tablet Take 1 tablet (25 mg total) by mouth daily.  . metFORMIN (GLUCOPHAGE) 1000 MG tablet Take 0.5 tablets (500 mg total) by mouth 2 (two) times daily with a meal.  . metoprolol succinate (TOPROL XL) 50 MG 24 hr tablet Take 1 tablet (50 mg total) by mouth daily. Take with or immediately following a meal.  . rosuvastatin (CRESTOR) 20 MG tablet TAKE 1 TABLET BY MOUTH EVERY DAY     Allergies:   Amlodipine besylate, Hydrochlorothiazide, and Lisinopril   Social History   Socioeconomic History  . Marital status: Married    Spouse name: Not on file  . Number of children: Not on file  . Years of education: Not on file  . Highest education level: Not on file  Occupational History  . Not on file  Tobacco Use  . Smoking status: Former Smoker    Packs/day: 1.50    Years: 41.00    Pack years: 61.50    Types: Cigarettes    Quit date: 08/26/2010     Years since quitting: 9.6  . Smokeless tobacco: Never Used  . Tobacco comment: Quit 2012  Vaping Use  . Vaping Use: Never used  Substance and Sexual Activity  . Alcohol use: Yes    Comment: occasionally  . Drug use: Not Currently  . Sexual activity: Not Currently  Other Topics Concern  . 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 Difficulty of Paying Living Expenses: Not hard at all  Food Insecurity: No Food Insecurity  . Worried About Charity fundraiser in the Last Year: Never true  . Ran Out of Food in the Last Year: Never true  Transportation Needs: No Transportation Needs  . Lack of Transportation (Medical): No  . Lack of Transportation (Non-Medical): No  Physical Activity:   . Days of Exercise per Week:   . Minutes of Exercise per Session:   Stress:   . Feeling of Stress :   Social Connections:   . Frequency of Communication with Friends and Family:   . Frequency of Social Gatherings with Friends and Family:   . Attends Religious Services:   . Active Member of Clubs or Organizations:   . Attends Archivist Meetings:   Marland Kitchen Marital Status:      Family History: The patient's family history includes Alzheimer's disease in his father; Diabetes in his father; Hypertension in his mother; Melanoma (age of onset: 62) in his father. There is no history of Colon cancer, Esophageal cancer, Colon polyps, or Stomach cancer.  ROS:   Review of Systems  Constitution: Negative for decreased appetite, fever and weight gain.  HENT: Negative for congestion, ear discharge, hoarse voice and sore throat.   Eyes: Negative for discharge, redness, vision loss in right eye and visual halos.  Cardiovascular: Negative for chest pain, dyspnea on exertion, leg swelling, orthopnea and  palpitations.  Respiratory: Negative for cough, hemoptysis, shortness of breath and snoring.   Endocrine: Negative for heat intolerance and polyphagia.  Hematologic/Lymphatic: Negative for bleeding problem. Does not bruise/bleed easily.  Skin: Negative for flushing, nail changes, rash and suspicious lesions.  Musculoskeletal: Negative  for arthritis, joint pain, muscle cramps, myalgias, neck pain and stiffness.  Gastrointestinal: Negative for abdominal pain, bowel incontinence, diarrhea and excessive appetite.  Genitourinary: Negative for decreased libido, genital sores and incomplete emptying.  Neurological: Negative for brief paralysis, focal weakness, headaches and loss of balance.  Psychiatric/Behavioral: Negative for altered mental status, depression and suicidal ideas.  Allergic/Immunologic: Negative for HIV exposure and persistent infections.    EKGs/Labs/Other Studies Reviewed:    The following studies were reviewed today:   EKG:  None today  Echo IMPRESSIONS 03/27/2020 1. Left ventricular ejection fraction, by estimation, is 25 to 30%. The left ventricle has severely decreased function. The left ventricle demonstrates regional wall motion abnormalities (see scoring  diagram/findings for description). There is moderate left ventricular hypertrophy. Left ventricular diastolic parameters are  consistent with Grade I diastolic dysfunction (impaired relaxation).  2. Right ventricular systolic function is normal. The right ventricular  size is normal. Tricuspid regurgitation signal is inadequate for assessing PA pressure.  3. The mitral valve is normal in structure. Trivial mitral valve regurgitation. No evidence of mitral stenosis.  4. The aortic valve has been repaired/replaced. Aortic valve regurgitation is not visualized. No aortic stenosis is present. There is a 23 mm [employer] valve present in the aortic position. Procedure Date: 03/04/2018. Aortic valve mean gradient    measures 10.0 mmHg.  5. The inferior vena cava is normal in size with <50% respiratory variability, suggesting right atrial pressure of 8 mmHg.   Recent Labs: 03/26/2020: ALT 13; TSH 0.574 03/31/2020: BUN 8; Creatinine, Ser 0.76; Hemoglobin 15.1; Platelets 425.0; Potassium 3.9; Sodium 138  Recent Lipid Panel    Component Value Date/Time   CHOL 127 03/23/2020 1129   TRIG 66.0 03/23/2020 1129   HDL 50.00 03/23/2020 1129   CHOLHDL 3 03/23/2020 1129   VLDL 13.2 03/23/2020 1129   LDLCALC 64 03/23/2020 1129   LDLDIRECT 177.6 10/07/2007 1001    Physical Exam:    VS:  BP (!) 140/94 (BP Location: Left Arm, Patient Position: Sitting, Cuff Size: Normal)   Pulse 76   Ht 6\' 4"  (1.93 m)   Wt 174 lb (78.9 kg)   SpO2 94%   BMI 21.18 kg/m     Wt Readings from Last 3 Encounters:  04/05/20 174 lb (78.9 kg)  03/31/20 174 lb 8 oz (79.2 kg)  03/26/20 169 lb (76.7 kg)     GEN: Well nourished, well developed in no acute distress HEENT: Normal NECK: No JVD; No carotid bruits LYMPHATICS: No lymphadenopathy CARDIAC: S1S2 noted,RRR, no murmurs, rubs, gallops RESPIRATORY:  Clear to auscultation without rales, wheezing or rhonchi  ABDOMEN: Soft, non-tender, non-distended, +bowel sounds, no guarding. EXTREMITIES: No edema, No cyanosis, no clubbing MUSCULOSKELETAL:  No deformity  SKIN: Warm and dry NEUROLOGIC:  Alert and oriented x 3, non-focal PSYCHIATRIC:  Normal affect, good insight  ASSESSMENT:    1. Depressed left ventricular ejection fraction   2. Essential hypertension   3. Coronary artery disease involving native coronary artery of native heart without angina pectoris   4. Paroxysmal atrial fibrillation (HCC)   5. Mixed hyperlipidemia   6. S/P AVR   7. Status post coronary artery bypass graft    PLAN:     He was recently hospitalized and noted with his depressed ejection fraction which was decreased from 45-50% to 25-30%. He was started on losartan 25 mg daily at the time with  discharge. So he is currently on Toprol-XL 50 mg daily along with losartan 25 mg  daily.  Because he recently just been placed on his losartan I am holding off on transitioning to [facility] for now.  But today I am going to add Aldactone 12.5 mg to his medication regimen.   In addition I think he should be evaluated by our advanced heart failure group because if he does not respond were to optimizing his medications he may need an ischemic evaluation.  His blood pressure is also elevated in the office I am hoping that the addition to his Aldactone 12.5 mg daily will help him get to a target of less than 130/80 mmHg.  Hyperlipidemia-continue patient's Crestor 20 mg daily.  The patient is in agreement with the above plan. The patient left the office in stable condition.  The patient will follow up in 1 month with Dr. Agustin Cree.   Medication Adjustments/Labs and Tests Ordered: Current medicines are reviewed at length with the patient today.  Concerns regarding medicines are outlined above.  Orders Placed This Encounter  Procedures  . AMB referral to CHF clinic   Meds ordered this encounter  Medications  . spironolactone (ALDACTONE) 25 MG tablet    Sig: Take 0.5 tablets (12.5 mg total) by mouth daily.    Dispense:  45 tablet    Refill:  3    Patient Instructions  Medication Instructions:  Your physician has recommended you make the following change in your medication:  START: Aldactone 12.5 mg take 0.5 tablet by mouth daily.  *If you need a refill on your cardiac medications before your next appointment, please call your pharmacy*   Lab Work: None If you have labs (blood work) drawn today and your tests are completely normal, you will receive your results only by: Marland Kitchen MyChart Message (if you have MyChart) OR . A paper copy in the mail If you have any lab test that is abnormal or we need to change your treatment, we will call you to review the  results.   Testing/Procedures: None   Follow-Up: At [facility], you and your health needs are our priority.  As part of our continuing mission to provide you with exceptional heart care, we have created designated Provider Care Teams.  These Care Teams include your primary Cardiologist (physician) and Advanced Practice Providers (APPs -  Physician Assistants and Nurse Practitioners) who all work together to provide you with the care you need, when you need it.  We recommend signing up for the patient portal called "MyChart".  Sign up information is provided on this After Visit Summary.  MyChart is used to connect with patients for Virtual Visits (Telemedicine).  Patients are able to view lab/test results, encounter notes, upcoming appointments, etc.  Non-urgent messages can be sent to your provider as well.   To learn more about what you can do with MyChart, go to [email or web address].    Your next appointment:   1 month(s)  The format for your next appointment:   In Person  Provider:   Jenne Campus, MD   Other Instructions We have put in a referral for you to go to the Heart Failure clinic. They will call you to schedule this appointment     Adopting a Healthy Lifestyle.  Know what a healthy weight is for you (roughly BMI <25) and aim to maintain this   Aim for 7+ servings of fruits and vegetables daily   65-80+ fluid ounces of water or unsweet tea for healthy kidneys   Limit to max 1 drink of alcohol per day; avoid  smoking/tobacco   Limit animal fats in diet for cholesterol and heart health - choose grass fed whenever available   Avoid highly processed foods, and foods high in saturated/trans fats   Aim for low stress - take time to unwind and care for your mental health   Aim for 150 min of moderate intensity exercise weekly for heart health, and weights twice weekly for bone health   Aim for 7-9 hours of sleep daily   When it comes to diets, agreement  about the perfect plan isnt easy to find, even among the experts. Experts at the [place] [employer] developed an idea known as the Healthy Eating Plate. Just imagine a plate divided into logical, healthy portions.   The emphasis is on diet quality:   Load up on vegetables and fruits - one-half of your plate: Aim for color and variety, and remember that potatoes dont count.   Go for whole grains - one-quarter of your plate: Whole wheat, barley, wheat berries, quinoa, oats, brown rice, and foods made with them. If you want pasta, go with whole wheat pasta.   Protein power - one-quarter of your plate: Fish, chicken, beans, and nuts are all healthy, versatile protein sources. Limit red meat.   The diet, however, does go beyond the plate, offering a few other suggestions.   Use healthy plant oils, such as olive, canola, soy, corn, sunflower and peanut. Check the labels, and avoid partially hydrogenated oil, which have unhealthy trans fats.   If youre thirsty, drink water. Coffee and tea are good in moderation, but skip sugary drinks and limit milk and dairy products to one or two daily servings.   The type of carbohydrate in the diet is more important than the amount. Some sources of carbohydrates, such as vegetables, fruits, whole grains, and beans-are healthier than others.   Finally, stay active  Signed, Berniece Salines, DO  04/05/2020 5:39 PM    [facility]

## 2020-04-05 NOTE — Patient Instructions (Signed)
Medication Instructions:  Your physician has recommended you make the following change in your medication:  START: Aldactone 12.5 mg take 0.5 tablet by mouth daily.  *If you need a refill on your cardiac medications before your next appointment, please call your pharmacy*   Lab Work: None If you have labs (blood work) drawn today and your tests are completely normal, you will receive your results only by: Marland Kitchen MyChart Message (if you have MyChart) OR . A paper copy in the mail If you have any lab test that is abnormal or we need to change your treatment, we will call you to review the results.   Testing/Procedures: None   Follow-Up: At [facility], you and your health needs are our priority.  As part of our continuing mission to provide you with exceptional heart care, we have created designated Provider Care Teams.  These Care Teams include your primary Cardiologist (physician) and Advanced Practice Providers (APPs -  Physician Assistants and Nurse Practitioners) who all work together to provide you with the care you need, when you need it.  We recommend signing up for the patient portal called "MyChart".  Sign up information is provided on this After Visit Summary.  MyChart is used to connect with patients for Virtual Visits (Telemedicine).  Patients are able to view lab/test results, encounter notes, upcoming appointments, etc.  Non-urgent messages can be sent to your provider as well.   To learn more about what you can do with MyChart, go to [email or web address].    Your next appointment:   1 month(s)  The format for your next appointment:   In Person  Provider:   Jenne Campus, MD   Other Instructions We have put in a referral for you to go to the Heart Failure clinic. They will call you to schedule this appointment

## 2020-04-17 ENCOUNTER — Telehealth: Payer: Self-pay | Admitting: Internal Medicine

## 2020-04-17 MED ORDER — ROSUVASTATIN CALCIUM 20 MG PO TABS: 20.0000 mg | ORAL_TABLET | Freq: Every day | ORAL | 0 refills | Status: DC

## 2020-04-17 NOTE — Telephone Encounter (Signed)
Patient wife states he only have 3 pills left  Medication: rosuvastatin (CRESTOR) 20 MG tablet [[id number]]       Has the patient contacted their pharmacy?  (If no, request that the patient contact the pharmacy for the refill.) (If yes, when and what did the pharmacy advise?)     Preferred Pharmacy (with phone number or street name): [id number] [address], [place]  [address] [place] [address]  Phone:  [phone] Fax:  [phone]      Agent: Please be advised that RX refills may take up to 3 business days. We ask that you follow-up with your pharmacy.

## 2020-04-17 NOTE — Telephone Encounter (Signed)
Rx sent 

## 2020-04-18 NOTE — Progress Notes (Addendum)
Name: [name]  MRN/ DOB: [id number], [birth year]   Age/ Sex: 69 y.o., male    PCP: Colon Branch, MD   Reason for Endocrinology Evaluation: Type 2 Diabetes Mellitus     Date of Initial Endocrinology Visit: 04/19/2020     PATIENT IDENTIFIER: [name] is a 69 y.o. male with a past medical history of T2Dm, CAD ( S/P CABG). The patient presented for initial endocrinology clinic visit on 04/19/2020 for consultative assistance with his diabetes management.    HPI: [name] is accompanied by spouse [name]    Diagnosed with DM in 2009 Prior Medications tried/Intolerance:  Reduced metformin from 1000 to 500 mg daily due to hypoglycemia Currently checking blood sugars 2 x / day Hypoglycemia episodes : Yes             Symptoms: yes                Frequency: once Hemoglobin A1c has ranged from 7.2% in 2021, peaking at 8.8%in 2018. Patient required assistance for hypoglycemia: Yes  Patient has required hospitalization within the last 1 year from hyper or hypoglycemia: Yes - 03/2020 Woke up in the morning with AMS BG was 40 mg/dL.   In terms of diet, the patient eats twice a day. Snacks occasionally. Loves fruit.    HOME DIABETES REGIMEN: Levemir 25 units daily  Novolog 10 units with supper  Metformin 1000 mg , half a tablet daily    Statin: yes ACE-I/ARB: yes Prior Diabetic Education: yes   METER DOWNLOAD SUMMARY: Did not bring    DIABETIC COMPLICATIONS: Microvascular complications:    Denies: CKD, retinopathy , neuropathy   Last eye exam: Completed 08/2019  Macrovascular complications:   CAD  Denies: PVD, CVA   PAST HISTORY: Past Medical History:  Past Medical History:  Diagnosis Date  . Abnormal myocardial perfusion study 2006   EF 44% ? inferoseptal ischemia. no cath done  . Benign neoplasm of cecum   . Breast mass, right 03/2008  . Cardiomyopathy ([place]) 07/20/2018   Ejection fraction 4045% in September 2019  . Centrilobular emphysema  ([place]) 08/25/2018   02/04/2018-high-res CT- no evidence of interstitial lung disease, severe centrilobular emphysema and mild diffuse bronchial wall thickening suggesting COPD, no calcified pleural plaques no pleural effusion, mild smooth bilateral pleural thickening which is nonspecific  02/04/2018-pulmonary function test- FVC 3.37 (71% predicted), postbronchodilator ratio 50, postbronchodilator FEV1 54, mid flow reve  . COLONIC POLYPS, HX OF 03/18/2007   Qualifier: Diagnosis of  By: [name]   . Contact dermatitis 01/31/2013  . COPD (chronic obstructive pulmonary disease) ([place])    "dx'd 12/2017"  . COPD GOLD II A  08/25/2018   02/04/2018-high-res CT- no evidence of interstitial lung disease, severe centrilobular emphysema and mild diffuse bronchial wall thickening suggesting COPD, no calcified pleural plaques no pleural effusion, mild smooth bilateral pleural thickening which is nonspecific  02/04/2018-pulmonary function test- FVC 3.37 (71% predicted), postbronchodilator ratio 50, postbronchodilator FEV1 54, mid flow reve  . Coronary artery disease   . Critical aortic valve stenosis 01/12/2018   Calculated valve area less than 0.5 cm mean gradient more than 60 mm of mercury  . DEGENERATIVE JOINT DISEASE, BACK 03/18/2007   Qualifier: Diagnosis of  By: [name]   . Depressed left ventricular ejection fraction 03/28/2020  . Dyspnea on exertion 01/12/2018  . ERECTILE DYSFUNCTION, ORGANIC 02/10/2009   Qualifier: Diagnosis of  By: [name]   .  Heart murmur   . HEMORRHOIDS, INTERNAL 03/18/2007   Qualifier: Diagnosis of  By: [name]   . Hyperlipemia 10/21/2007   Qualifier: Diagnosis of  By: [name]   . Hypertension   . Hypoglycemia due to insulin 03/26/2020  . Low back pain   . MYOCARDIAL PERFUSION SCAN, WITH STRESS TEST, ABNORMAL 08/11/2008   Qualifier: Diagnosis of  By: Johnsie Cancel, MD, Rona Ravens   . Nocturnal hypoxemia 08/25/2018   04/09/18 >>> ONO -  telephone note>>> Patient's ONO on room air showed that he was less than 88% for more than an hour. Patient needs to be on 2 L at HS.  10/06/2018-overnight oximetry-entire duration was 4 hours and 23 minutes, SPO2 less than 88% for 25 minutes and 32 seconds    . Oxygen deficiency    uses 2L at night  . Paroxysmal atrial fibrillation ([place]) 10/12/2018  . S/P AVR 03/04/2018   AVR 23 mm Edwards Magna Valve - bioprostetic 03/04/2018 LIMA - LAD  . Status post coronary artery bypass graft 03/20/2018   LIMA to LAD during aortic valve replacement surgery 03/04/2018  . Type 2 diabetes mellitus, uncontrolled, with neuropathy ([place]) 05/20/2008   Qualifier: Diagnosis of  By: [name]    Past Surgical History:  Past Surgical History:  Procedure Laterality Date  . ABDOMINAL AORTOGRAM N/A 01/13/2018   Procedure: ABDOMINAL AORTOGRAM;  Surgeon: Troy Sine, MD;  Location: [facility];  Service: Cardiovascular;  Laterality: N/A;  . AORTIC VALVE REPLACEMENT N/A 03/04/2018   Procedure: AORTIC VALVE REPLACEMENT (AVR) 57mm Edwards Magna Ease Tissue Valve.;  Surgeon: Melrose Nakayama, MD;  Location: [facility];  Service: Open Heart Surgery;  Laterality: N/A;  . CARDIAC VALVE REPLACEMENT    . COLONOSCOPY W/ POLYPECTOMY    . COLONOSCOPY WITH PROPOFOL N/A 02/22/2020   Procedure: COLONOSCOPY WITH PROPOFOL;  Surgeon: Doran Stabler, MD;  Location: [facility];  Service: Gastroenterology;  Laterality: N/A;  . CORONARY ARTERY BYPASS GRAFT N/A 03/04/2018   Procedure: CORONARY ARTERY BYPASS GRAFTING (CABG) x1:  LIMA to LAD.;  Surgeon: Melrose Nakayama, MD;  Location: [facility];  Service: Open Heart Surgery;  Laterality: N/A;  . HEMOSTASIS CLIP PLACEMENT  02/22/2020   Procedure: HEMOSTASIS CLIP PLACEMENT;  Surgeon: Doran Stabler, MD;  Location: [facility];  Service: Gastroenterology;;  . LIPOMA EXCISION Left 07/2008    "back of shoulder" Dr Gershon Crane  . POLYPECTOMY  02/22/2020   Procedure: POLYPECTOMY;   Surgeon: Doran Stabler, MD;  Location: [facility];  Service: Gastroenterology;;  . RIGHT/LEFT HEART CATH AND CORONARY ANGIOGRAPHY N/A 01/13/2018   Procedure: RIGHT/LEFT HEART CATH AND CORONARY ANGIOGRAPHY;  Surgeon: Troy Sine, MD;  Location: [facility];  Service: Cardiovascular;  Laterality: N/A;  . TEE WITHOUT CARDIOVERSION N/A 03/04/2018   Procedure: TRANSESOPHAGEAL ECHOCARDIOGRAM (TEE);  Surgeon: Melrose Nakayama, MD;  Location: [facility];  Service: Open Heart Surgery;  Laterality: N/A;  . TONSILLECTOMY    . WISDOM TOOTH EXTRACTION        Social History:  reports that he quit smoking about 9 years ago. His smoking use included cigarettes. He has a 61.50 pack-year smoking history. He has never used smokeless tobacco. He reports current alcohol use. He reports previous drug use. Family History:  Family History  Problem Relation Age of Onset  . Melanoma Father 37       deceased secondary to melanoma  . Alzheimer's  disease Father   . Diabetes Father   . Hypertension Mother        alive -81  . Colon cancer Neg Hx   . Esophageal cancer Neg Hx   . Colon polyps Neg Hx   . Stomach cancer Neg Hx      HOME MEDICATIONS: Allergies as of 04/19/2020      Reactions   Amlodipine Besylate Other (See Comments)   REACTION: ? caused left axillary nodules/chest flutter   Hydrochlorothiazide Hives   Lisinopril    Hyperkalemia      Medication List       Accurate as of April 19, 2020 10:03 AM. If you have any questions, ask your nurse or doctor.        aspirin 325 MG EC tablet Take 1 tablet (325 mg total) by mouth daily.   cetirizine 10 MG tablet Commonly known as: ZYRTEC Take 10 mg by mouth daily as needed for allergies.   glucose blood test strip Commonly known as: Prodigy No Coding Blood Gluc Use as instructed to check blood sugar twice a day.  DX  E11.40   hydrALAZINE 10 MG tablet Commonly known as: APRESOLINE Take 1 tablet (10 mg total) by mouth 3 (three)  times daily.   Insulin Pen Needle 31G X 8 MM Misc Commonly known as: B-D ULTRAFINE III SHORT PEN USE AS DIRECTED   Levemir FlexTouch 100 UNIT/ML FlexPen Generic drug: insulin detemir Inject 25 Units into the skin daily. Inject 25 units under the skin once daily. What changed: Another medication with the same name was removed. Continue taking this medication, and follow the directions you see here. Changed by: Dorita Sciara, MD   losartan 25 MG tablet Commonly known as: Cozaar Take 1 tablet (25 mg total) by mouth daily.   metFORMIN 1000 MG tablet Commonly known as: GLUCOPHAGE Take 500 mg by mouth daily. Take one half tablet (500mg  total) by mouth once daily. What changed: Another medication with the same name was removed. Continue taking this medication, and follow the directions you see here. Changed by: Dorita Sciara, MD   metoprolol succinate 50 MG 24 hr tablet Commonly known as: Toprol XL Take 1 tablet (50 mg total) by mouth daily. Take with or immediately following a meal.   NovoLOG FlexPen 100 UNIT/ML FlexPen Generic drug: insulin aspart Inject 10 Units into the skin daily. Inject 10 units under the skin once daily before dinner. What changed: Another medication with the same name was removed. Continue taking this medication, and follow the directions you see here. Changed by: Dorita Sciara, MD   rosuvastatin 20 MG tablet Commonly known as: CRESTOR Take 1 tablet (20 mg total) by mouth daily.   spironolactone 25 MG tablet Commonly known as: Aldactone Take 0.5 tablets (12.5 mg total) by mouth daily.   VITAMIN B-12 PO Take 1 tablet by mouth daily.        ALLERGIES: Allergies  Allergen Reactions  . Amlodipine Besylate Other (See Comments)    REACTION: ? caused left axillary nodules/chest flutter  . Hydrochlorothiazide Hives  . Lisinopril     Hyperkalemia      REVIEW OF SYSTEMS: A comprehensive ROS was conducted with the patient and is  negative except as per HPI    OBJECTIVE:   VITAL SIGNS: BP (!) 154/72 (BP Location: Left Arm, Patient Position: Sitting, Cuff Size: Normal)   Pulse 76   Ht 6\' 4"  (1.93 m)   Wt 172 lb 6.4 oz (78.2  kg)   SpO2 96%   BMI 20.99 kg/m    PHYSICAL EXAM:  General: Pt appears well and is in NAD  Neck: General: Supple without adenopathy or carotid bruits. Thyroid: Thyroid size normal.  No goiter or nodules appreciated. No thyroid bruit.  Lungs: Clear with good BS bilat with no rales, rhonchi, or wheezes  Heart: RRR with normal S1 and S2 and no gallops; no murmurs; no rub  Abdomen: Normoactive bowel sounds, soft, nontender, without masses or organomegaly palpable  Extremities:  Lower extremities - No pretibial edema. No lesions.  Skin: Normal texture and temperature to palpation.  Neuro: MS is good with appropriate affect, pt is alert and Ox3    DATA REVIEWED:  Lab Results  Component Value Date   HGBA1C 7.2 (H) 03/23/2020   HGBA1C 7.4 (H) 01/06/2020   HGBA1C 7.5 (H) 10/01/2019   Lab Results  Component Value Date   MICROALBUR <0.7 09/08/2018   LDLCALC 64 03/23/2020   CREATININE 0.76 03/31/2020   Lab Results  Component Value Date   MICRALBCREAT 0.9 09/08/2018    Lab Results  Component Value Date   CHOL 127 03/23/2020   HDL 50.00 03/23/2020   LDLCALC 64 03/23/2020   LDLDIRECT 177.6 10/07/2007   TRIG 66.0 03/23/2020   CHOLHDL 3 03/23/2020        ASSESSMENT / PLAN / RECOMMENDATIONS:   1) Type 2 Diabetes Mellitus, Sub-Optimally controlled, With macrovascular complications - Most recent A1c of 7.2 %. Goal A1c < 7.0 %.    Plan: GENERAL: I have discussed with the patient the pathophysiology of diabetes. We went over the natural progression of the disease. We talked about both insulin resistance and insulin deficiency. We stressed the importance of lifestyle changes including diet and exercise. I explained the complications associated with diabetes including retinopathy,  nephropathy, neuropathy as well as increased risk of cardiovascular disease. We went over the benefit seen with glycemic control.    I explained to the patient that diabetic patients are at higher than normal risk for amputations. Discussed pharmacokinetics of basal/bolus insulin and the importance of taking prandial insulin with meals.   We also discussed avoiding sugar-sweetened beverages and snacks, when possible.   Will make the following adjustments.   Of note, the pt would be a great candidate for an SGLT-2 inhibitor due to CAD history , but given he is  BMI is low for a typical type 2 diabetic, I will have to keep that in mind prior to prescribing it. Not now our goal is to steady his glucose and avoid hypoglycemia   Pt not interested in CGM   MEDICATIONS:  - Decrease Levemir to 20 units daily  - Novolog 8 units with each meal  - Continue Metformin 1000 mg , HALF a tablet daily   EDUCATION / INSTRUCTIONS:  BG monitoring instructions: Patient is instructed to check his blood sugars 3 times a day, before meals   Call [facility] if: BG persistently < 70  . I reviewed the Rule of 15 for the treatment of hypoglycemia in detail with the patient. Literature supplied.   2) Diabetic complications:   Eye: Does not have known diabetic retinopathy.   Neuro/ Feet: Does not have known diabetic peripheral neuropathy.  Renal: Patient does not have known baseline CKD. He is on an ACEI/ARB at present.  3) Dyslipidemia: Patient is on rosuvastatin 20 mg daily. LDL at goal at 64 mg/dL        Signed electronically [place]  [id number] [facility]  [facility] [address] [place], [place] [address] Phone: [phone] FAX: [phone]   CC: Colon Branch, [address] [place] [address] Phone: [phone]  Fax: [phone]    Return to Endocrinology clinic as below: Future  Appointments  Date Time Provider [place]  05/05/2020  9:40 AM Berniece Salines, DO [facility] None  05/12/2020  9:20 AM Colon Branch, MD [id number]

## 2020-04-19 ENCOUNTER — Ambulatory Visit (INDEPENDENT_AMBULATORY_CARE_PROVIDER_SITE_OTHER): Payer: Medicare Other | Admitting: Internal Medicine

## 2020-04-19 ENCOUNTER — Other Ambulatory Visit: Payer: Self-pay

## 2020-04-19 ENCOUNTER — Encounter: Payer: Self-pay | Admitting: Internal Medicine

## 2020-04-19 VITALS — BP 154/72 | HR 76 | Ht 76.0 in | Wt 172.4 lb

## 2020-04-19 DIAGNOSIS — E1165 Type 2 diabetes mellitus with hyperglycemia: Secondary | ICD-10-CM | POA: Diagnosis not present

## 2020-04-19 DIAGNOSIS — Z794 Long term (current) use of insulin: Secondary | ICD-10-CM | POA: Diagnosis not present

## 2020-04-19 DIAGNOSIS — E1140 Type 2 diabetes mellitus with diabetic neuropathy, unspecified: Secondary | ICD-10-CM | POA: Diagnosis not present

## 2020-04-19 DIAGNOSIS — IMO0002 Reserved for concepts with insufficient information to code with codable children: Secondary | ICD-10-CM

## 2020-04-19 DIAGNOSIS — E785 Hyperlipidemia, unspecified: Secondary | ICD-10-CM

## 2020-04-19 LAB — POCT GLUCOSE (DEVICE FOR HOME USE): Glucose Fasting, POC: 236 mg/dL — AB (ref 70–99)

## 2020-04-19 MED ORDER — INSULIN PEN NEEDLE 32G X 4 MM MISC: 1.0000 | 6 refills | Status: DC

## 2020-04-19 MED ORDER — LEVEMIR FLEXTOUCH 100 UNIT/ML ~~LOC~~ SOPN
20.0000 [IU] | PEN_INJECTOR | Freq: Every day | SUBCUTANEOUS | 6 refills | Status: DC
Start: 2020-04-19 — End: 2020-07-01

## 2020-04-19 MED ORDER — NOVOLOG FLEXPEN 100 UNIT/ML ~~LOC~~ SOPN
8.0000 [IU] | PEN_INJECTOR | Freq: Three times a day (TID) | SUBCUTANEOUS | 6 refills | Status: DC
Start: 2020-04-19 — End: 2020-07-06

## 2020-04-19 NOTE — Patient Instructions (Signed)
-   Decrease Levemir to 20 units daily  - Novolog 8 units with each meal  - Continue Metformin 1000 mg , HALF a tablet daily     HOW TO TREAT LOW BLOOD SUGARS (Blood sugar LESS THAN 70 MG/DL)  Please follow the RULE OF 15 for the treatment of hypoglycemia treatment (when your (blood sugars are less than 70 mg/dL)    STEP 1: Take 15 grams of carbohydrates when your blood sugar is low, which includes:   3-4 GLUCOSE TABS  OR  3-4 OZ OF JUICE OR REGULAR SODA OR  ONE TUBE OF GLUCOSE GEL     STEP 2: RECHECK blood sugar in 15 MINUTES STEP 3: If your blood sugar is still low at the 15 minute recheck --> then, go back to STEP 1 and treat AGAIN with another 15 grams of carbohydrates.

## 2020-04-20 DIAGNOSIS — E785 Hyperlipidemia, unspecified: Secondary | ICD-10-CM

## 2020-04-20 DIAGNOSIS — E1165 Type 2 diabetes mellitus with hyperglycemia: Secondary | ICD-10-CM

## 2020-04-20 DIAGNOSIS — Z794 Long term (current) use of insulin: Secondary | ICD-10-CM

## 2020-04-20 HISTORY — DX: Long term (current) use of insulin: E11.65

## 2020-04-20 HISTORY — DX: Long term (current) use of insulin: Z79.4

## 2020-04-20 HISTORY — DX: Hyperlipidemia, unspecified: E78.5

## 2020-04-22 ENCOUNTER — Other Ambulatory Visit: Payer: Self-pay | Admitting: Internal Medicine

## 2020-04-23 ENCOUNTER — Encounter: Payer: Self-pay | Admitting: Internal Medicine

## 2020-04-28 LAB — MICROALBUMIN / CREATININE URINE RATIO
Creatinine, Urine: 121 mg/dL (ref 20–320)
Microalb Creat Ratio: 7 mcg/mg creat (ref <30)
Microalb, Ur: 0.9 mg/dL

## 2020-04-28 LAB — GLUTAMIC ACID DECARBOXYLASE AUTO ABS: Glutamic Acid Decarb Ab: <5 IU/mL (ref <5)

## 2020-04-28 LAB — ISLET CELL AB SCREEN RFLX TO TITER: ISLET CELL ANTIBODY SCREEN: NEGATIVE

## 2020-05-01 ENCOUNTER — Other Ambulatory Visit: Payer: Self-pay | Admitting: Family

## 2020-05-05 ENCOUNTER — Ambulatory Visit (INDEPENDENT_AMBULATORY_CARE_PROVIDER_SITE_OTHER): Payer: Medicare Other | Admitting: Cardiology

## 2020-05-05 ENCOUNTER — Other Ambulatory Visit: Payer: Self-pay | Admitting: Cardiology

## 2020-05-05 ENCOUNTER — Encounter: Payer: Self-pay | Admitting: Cardiology

## 2020-05-05 ENCOUNTER — Other Ambulatory Visit: Payer: Self-pay

## 2020-05-05 VITALS — BP 132/90 | HR 68 | Ht 76.0 in | Wt 174.0 lb

## 2020-05-05 DIAGNOSIS — I480 Paroxysmal atrial fibrillation: Secondary | ICD-10-CM | POA: Diagnosis not present

## 2020-05-05 DIAGNOSIS — E118 Type 2 diabetes mellitus with unspecified complications: Secondary | ICD-10-CM

## 2020-05-05 DIAGNOSIS — Z794 Long term (current) use of insulin: Secondary | ICD-10-CM

## 2020-05-05 DIAGNOSIS — R0600 Dyspnea, unspecified: Secondary | ICD-10-CM | POA: Diagnosis not present

## 2020-05-05 DIAGNOSIS — I2510 Atherosclerotic heart disease of native coronary artery without angina pectoris: Secondary | ICD-10-CM

## 2020-05-05 DIAGNOSIS — R0989 Other specified symptoms and signs involving the circulatory and respiratory systems: Secondary | ICD-10-CM | POA: Diagnosis not present

## 2020-05-05 DIAGNOSIS — I10 Essential (primary) hypertension: Secondary | ICD-10-CM

## 2020-05-05 DIAGNOSIS — Z952 Presence of prosthetic heart valve: Secondary | ICD-10-CM

## 2020-05-05 DIAGNOSIS — R0609 Other forms of dyspnea: Secondary | ICD-10-CM

## 2020-05-05 HISTORY — DX: Presence of prosthetic heart valve: Z95.2

## 2020-05-05 MED ORDER — METOPROLOL SUCCINATE 25 MG PO CS24: 25.0000 mg | EXTENDED_RELEASE_CAPSULE | Freq: Every day | ORAL | 3 refills | Status: DC

## 2020-05-05 NOTE — H&P (View-Only) (Signed)
Cardiology Office Note:    Date:  05/05/2020   ID:  [name], DOB [birth year], MRN [id number]  PCP:  Colon Branch, MD  Cardiologist:  No primary care provider on file.  Electrophysiologist:  None   Referring MD: Colon Branch, MD   " I am still tired"   History of Present Illness:    [name] is a 69 y.o. male with a hx of coronary artery disease status post CABG in 2019, ischemic cardiomyopathy most recent EF on March 27, 2020 at which time his EF was reported to be 25 to 30%, Status post aortic valve replacement with a 23 Edwards valve also in July 2019, hypertension hyperlipidemia and diabetes.  The patient was admitted at the [facility] and discharged on March 27, 2020.  He was treated for hypoglycemia.  While in the hospital he reported significant fatigue his echocardiogram was repeated which showed a depressed ejection fraction compared to prior.  Post hospitalization he was asked to see his primary cardiologist.  I saw the patient on April 05, 2020 at that time we kept him on his losartan as well as his beta-blocker.  Add Aldactone.  He is here today for follow-up visit he tells me that he has been significantly fatigued with shortness of breath.  He was able prior to mow his lawn which at this time he only goes having gets significantly tired. He has no chest pain at this time.   Past Medical History:  Diagnosis Date  . Abnormal myocardial perfusion study 2006   EF 44% ? inferoseptal ischemia. no cath done  . Benign neoplasm of cecum   . Breast mass, right 03/2008  . Cardiomyopathy ([place]) 07/20/2018   Ejection fraction 4045% in September 2019  . Centrilobular emphysema ([place]) 08/25/2018   02/04/2018-high-res CT- no evidence of interstitial lung disease, severe centrilobular emphysema and mild diffuse bronchial wall thickening suggesting COPD, no calcified pleural plaques no pleural effusion, mild smooth bilateral pleural thickening which is nonspecific   02/04/2018-pulmonary function test- FVC 3.37 (71% predicted), postbronchodilator ratio 50, postbronchodilator FEV1 54, mid flow reve  . COLONIC POLYPS, HX OF 03/18/2007   Qualifier: Diagnosis of  By: [name]   . Contact dermatitis 01/31/2013  . COPD (chronic obstructive pulmonary disease) ([place])    "dx'd 12/2017"  . COPD GOLD II A  08/25/2018   02/04/2018-high-res CT- no evidence of interstitial lung disease, severe centrilobular emphysema and mild diffuse bronchial wall thickening suggesting COPD, no calcified pleural plaques no pleural effusion, mild smooth bilateral pleural thickening which is nonspecific  02/04/2018-pulmonary function test- FVC 3.37 (71% predicted), postbronchodilator ratio 50, postbronchodilator FEV1 54, mid flow reve  . Coronary artery disease   . Critical aortic valve stenosis 01/12/2018   Calculated valve area less than 0.5 cm mean gradient more than 60 mm of mercury  . DEGENERATIVE JOINT DISEASE, BACK 03/18/2007   Qualifier: Diagnosis of  By: [name]   . Depressed left ventricular ejection fraction 03/28/2020  . Dyspnea on exertion 01/12/2018  . ERECTILE DYSFUNCTION, ORGANIC 02/10/2009   Qualifier: Diagnosis of  By: [name]   . Heart murmur   . HEMORRHOIDS, INTERNAL 03/18/2007   Qualifier: Diagnosis of  By: [name]   . Hyperlipemia 10/21/2007   Qualifier: Diagnosis of  By: [name]   . Hypertension   . Hypoglycemia due to insulin 03/26/2020  . Low back pain   .  MYOCARDIAL PERFUSION SCAN, WITH STRESS TEST, ABNORMAL 08/11/2008   Qualifier: Diagnosis of  By: Johnsie Cancel, MD, Rona Ravens   . Nocturnal hypoxemia 08/25/2018   04/09/18 >>> ONO - telephone note>>> Patient's ONO on room air showed that he was less than 88% for more than an hour. Patient needs to be on 2 L at HS.  10/06/2018-overnight oximetry-entire duration was 4 hours and 23 minutes, SPO2 less than 88% for 25 minutes and 32 seconds    . Oxygen deficiency    uses 2L at  night  . Paroxysmal atrial fibrillation ([place]) 10/12/2018  . S/P AVR 03/04/2018   AVR 23 mm Edwards Magna Valve - bioprostetic 03/04/2018 LIMA - LAD  . Status post coronary artery bypass graft 03/20/2018   LIMA to LAD during aortic valve replacement surgery 03/04/2018  . Type 2 diabetes mellitus, uncontrolled, with neuropathy ([place]) 05/20/2008   Qualifier: Diagnosis of  By: [name]     Past Surgical History:  Procedure Laterality Date  . ABDOMINAL AORTOGRAM N/A 01/13/2018   Procedure: ABDOMINAL AORTOGRAM;  Surgeon: Troy Sine, MD;  Location: [facility];  Service: Cardiovascular;  Laterality: N/A;  . AORTIC VALVE REPLACEMENT N/A 03/04/2018   Procedure: AORTIC VALVE REPLACEMENT (AVR) 73mm Edwards Magna Ease Tissue Valve.;  Surgeon: Melrose Nakayama, MD;  Location: [place];  Service: Open Heart Surgery;  Laterality: N/A;  . CARDIAC VALVE REPLACEMENT    . COLONOSCOPY W/ POLYPECTOMY    . COLONOSCOPY WITH PROPOFOL N/A 02/22/2020   Procedure: COLONOSCOPY WITH PROPOFOL;  Surgeon: Doran Stabler, MD;  Location: [facility];  Service: Gastroenterology;  Laterality: N/A;  . CORONARY ARTERY BYPASS GRAFT N/A 03/04/2018   Procedure: CORONARY ARTERY BYPASS GRAFTING (CABG) x1:  LIMA to LAD.;  Surgeon: Melrose Nakayama, MD;  Location: [facility];  Service: Open Heart Surgery;  Laterality: N/A;  . HEMOSTASIS CLIP PLACEMENT  02/22/2020   Procedure: HEMOSTASIS CLIP PLACEMENT;  Surgeon: Doran Stabler, MD;  Location: [facility];  Service: Gastroenterology;;  . LIPOMA EXCISION Left 07/2008    "back of shoulder" Dr Gershon Crane  . POLYPECTOMY  02/22/2020   Procedure: POLYPECTOMY;  Surgeon: Doran Stabler, MD;  Location: [facility];  Service: Gastroenterology;;  . RIGHT/LEFT HEART CATH AND CORONARY ANGIOGRAPHY N/A 01/13/2018   Procedure: RIGHT/LEFT HEART CATH AND CORONARY ANGIOGRAPHY;  Surgeon: Troy Sine, MD;  Location: [facility];  Service: Cardiovascular;  Laterality: N/A;    . TEE WITHOUT CARDIOVERSION N/A 03/04/2018   Procedure: TRANSESOPHAGEAL ECHOCARDIOGRAM (TEE);  Surgeon: Melrose Nakayama, MD;  Location: [facility];  Service: Open Heart Surgery;  Laterality: N/A;  . TONSILLECTOMY    . WISDOM TOOTH EXTRACTION      Current Medications: Current Meds  Medication Sig  . aspirin EC 325 MG EC tablet Take 1 tablet (325 mg total) by mouth daily.  . cetirizine (ZYRTEC) 10 MG tablet Take 10 mg by mouth daily as needed for allergies.   . Cyanocobalamin (VITAMIN B-12 PO) Take 1 tablet by mouth daily.  Marland Kitchen FARXIGA 5 MG TABS tablet Take 5 mg by mouth daily.  Marland Kitchen glucose blood (PRODIGY NO CODING BLOOD GLUC) test strip Use as instructed to check blood sugar twice a day.  DX  E11.40  . hydrALAZINE (APRESOLINE) 10 MG tablet TAKE 1 TABLET 3 TIMES A DAY  . insulin aspart (NOVOLOG FLEXPEN) 100 UNIT/ML FlexPen Inject 8 Units into the skin 3 (three) times daily with meals. Inject  10 units under the skin once daily before dinner.  . insulin detemir (LEVEMIR FLEXTOUCH) 100 UNIT/ML FlexPen Inject 20 Units into the skin daily. Inject 25 units under the skin once daily.  . Insulin Pen Needle 32G X 4 MM MISC 1 Device by Does not apply route as directed.  Marland Kitchen losartan (COZAAR) 25 MG tablet Take 1 tablet (25 mg total) by mouth daily.  . metFORMIN (GLUCOPHAGE) 1000 MG tablet Take 500 mg by mouth daily. Take one half tablet (500mg  total) by mouth once daily.  . metoprolol tartrate (LOPRESSOR) 50 MG tablet Take 50 mg by mouth 2 (two) times daily.  . rosuvastatin (CRESTOR) 20 MG tablet Take 1 tablet (20 mg total) by mouth daily.  Marland Kitchen spironolactone (ALDACTONE) 25 MG tablet Take 0.5 tablets (12.5 mg total) by mouth daily.     Allergies:   Amlodipine besylate, Hydrochlorothiazide, and Lisinopril   Social History   Socioeconomic History  . Marital status: Married    Spouse name: Not on file  . Number of children: Not on file  . Years of education: Not on file  . Highest education level: Not on  file  Occupational History  . Not on file  Tobacco Use  . Smoking status: Former Smoker    Packs/day: 1.50    Years: 41.00    Pack years: 61.50    Types: Cigarettes    Quit date: 08/26/2010    Years since quitting: 9.6  . Smokeless tobacco: Never Used  . Tobacco comment: Quit 2012  Vaping Use  . Vaping Use: Never used  Substance and Sexual Activity  . Alcohol use: Yes    Comment: occasionally  . Drug use: Not Currently  . Sexual activity: Not Currently  Other Topics Concern  . 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 Difficulty of Paying Living Expenses: Not hard at all  Food Insecurity: No Food Insecurity  . Worried About Charity fundraiser in the Last Year: Never true  . Ran Out of Food in the Last Year: Never true  Transportation Needs: No Transportation Needs  . Lack of Transportation (Medical): No  . Lack of Transportation (Non-Medical): No  Physical Activity:   . Days of Exercise per Week: Not on file  . Minutes of Exercise per Session: Not on file  Stress:   . Feeling of Stress : Not on file  Social Connections:   . Frequency of Communication with Friends and Family: Not on file  . Frequency of Social Gatherings with Friends and Family: Not on file  . Attends Religious Services: Not on file  . Active Member of Clubs or Organizations: Not on file  . Attends Archivist Meetings: Not on file  . Marital Status: Not on file     Family History: The patient's family history includes Alzheimer's disease in his father; Diabetes in his father; Hypertension in his mother; Melanoma (age of onset: 37) in his father. There is no history of Colon cancer, Esophageal cancer, Colon polyps, or Stomach cancer.  ROS:   Review of Systems  Constitution: Negative for  decreased appetite, fever and weight gain.  HENT: Negative for congestion, ear discharge, hoarse  voice and sore throat.   Eyes: Negative for discharge, redness, vision loss in right eye and visual halos.  Cardiovascular: Negative for chest pain, dyspnea on exertion, leg swelling, orthopnea and palpitations.  Respiratory: Negative for cough, hemoptysis, shortness of breath and snoring.   Endocrine: Negative for heat intolerance and polyphagia.  Hematologic/Lymphatic: Negative for bleeding problem. Does not bruise/bleed easily.  Skin: Negative for flushing, nail changes, rash and suspicious lesions.  Musculoskeletal: Negative for arthritis, joint pain, muscle cramps, myalgias, neck pain and stiffness.  Gastrointestinal: Negative for abdominal pain, bowel incontinence, diarrhea and excessive appetite.  Genitourinary: Negative for decreased libido, genital sores and incomplete emptying.  Neurological: Negative for brief paralysis, focal weakness, headaches and loss of balance.  Psychiatric/Behavioral: Negative for altered mental status, depression and suicidal ideas.  Allergic/Immunologic: Negative for HIV exposure and persistent infections.    EKGs/Labs/Other Studies Reviewed:    The following studies were reviewed today:   EKG: None today.  Echocardiogram IMPRESSIONS  1. Left ventricular ejection fraction, by estimation, is 25 to 30%. The left ventricle has severely decreased function. The left ventricle demonstrates regional wall motion abnormalities (see scoring diagram/findings for description). There is moderate left ventricular hypertrophy. Left ventricular diastolic parameters are  consistent with Grade I diastolic dysfunction (impaired relaxation).  2. Right ventricular systolic function is normal. The right ventricular  size is normal. Tricuspid regurgitation signal is inadequate for assessing  PA pressure.  3. The mitral valve is normal in structure. Trivial mitral valve    regurgitation. No evidence of mitral stenosis.  4. The aortic valve has been repaired/replaced. Aortic valve  regurgitation is not visualized. No aortic stenosis is present. There is a  23 mm [employer] valve present in the aortic position. Procedure  Date: 03/04/2018. Aortic valve mean gradient  measures 10.0 mmHg.  5. The inferior vena cava is normal in size with <50% respiratory  variability, suggesting right atrial pressure of 8 mmHg.   Recent Labs: 03/26/2020: ALT 13; TSH 0.574 03/31/2020: BUN 8; Creatinine, Ser 0.76; Hemoglobin 15.1; Platelets 425.0; Potassium 3.9; Sodium 138  Recent Lipid Panel    Component Value Date/Time   CHOL 127 03/23/2020 1129   TRIG 66.0 03/23/2020 1129   HDL 50.00 03/23/2020 1129   CHOLHDL 3 03/23/2020 1129   VLDL 13.2 03/23/2020 1129   LDLCALC 64 03/23/2020 1129   LDLDIRECT 177.6 10/07/2007 1001    Physical Exam:    VS:  BP 132/90   Pulse 68   Ht 6\' 4"  (1.93 m)   Wt 174 lb (78.9 kg)   SpO2 96%   BMI 21.18 kg/m     Wt Readings from Last 3 Encounters:  05/05/20 174 lb (78.9 kg)  04/19/20 172 lb 6.4 oz (78.2 kg)  04/05/20 174 lb (78.9 kg)     GEN: Well nourished, well developed in no acute distress HEENT: Normal NECK: No JVD; No carotid bruits LYMPHATICS: No lymphadenopathy CARDIAC: S1S2 noted,RRR, no murmurs, rubs, gallops RESPIRATORY:  Clear to auscultation without rales, wheezing or rhonchi  ABDOMEN: Soft, non-tender, non-distended, +bowel sounds, no guarding. EXTREMITIES: No edema, No cyanosis, no clubbing MUSCULOSKELETAL:  No deformity  SKIN: Warm and dry NEUROLOGIC:  Alert and oriented x 3, non-focal PSYCHIATRIC:  Normal affect, good insight  ASSESSMENT:    1. Depressed left ventricular ejection fraction   2. Dyspnea on exertion   3. Essential hypertension   4. Coronary artery disease involving native coronary artery of native heart without angina pectoris   5. Paroxysmal  atrial fibrillation ([place])   6. Type 2  diabetes mellitus with complication, with long-term current use of insulin ([place])   7. H/O aortic valve replacement    PLAN:    This patient is to have some high risk for progression of coronary artery disease given his diabetes, with a recent drop in his EF as well as his progressive symptoms of shortness of breath and fatigue I like to proceed with a left heart catheterization in this patient.  I have educated the patient and his wife about the procedure.  All of their questions has been answered.  The patient understands that risks include but are not limited to stroke (1 in 1000), death (1 in 8), kidney failure [usually temporary] (1 in 500), bleeding (1 in 200), allergic reaction [possibly serious] (1 in 200), and agrees to proceed.   He will remain on his current medication regimen includes losartan 25 mg daily, Toprol-XL 50 mg daily, Aldactone 25 mg a day.  He is also on [place].  Post cath I intend to convert the patient from losartan to [facility].  His blood pressure is improved compared to prior visit.  Blood work will be done today to assess BMP, mag, CBC.  Diabetes mellitus continue medication per PCP.  The patient is in agreement with the above plan. The patient left the office in stable condition.  The patient will follow up in 1 month or sooner if needed.   Medication Adjustments/Labs and Tests Ordered: Current medicines are reviewed at length with the patient today.  Concerns regarding medicines are outlined above.  Orders Placed This Encounter  Procedures  . Basic metabolic panel  . CBC  . Magnesium   No orders of the defined types were placed in this encounter.   Patient Instructions  Medication Instructions:  Your physician recommends that you continue on your current medications as directed. Please refer to the Current Medication list given to you today.  *If you need a refill on your cardiac medications before your next appointment, please call your  pharmacy*   Lab Work: TODAY:  BMET, CBC, & MAGNESIUM  If you have labs (blood work) drawn today and your tests are completely normal, you will receive your results only by: Marland Kitchen MyChart Message (if you have MyChart) OR . A paper copy in the mail If you have any lab test that is abnormal or we need to change your treatment, we will call you to review the results.   Testing/Procedures: Your physician has requested that you have a cardiac catheterization. Cardiac catheterization is used to diagnose and/or treat various heart conditions. Doctors may recommend this procedure for a number of different reasons. The most common reason is to evaluate chest pain. Chest pain can be a symptom of coronary artery disease (CAD), and cardiac catheterization can show whether plaque is narrowing or blocking your heart's arteries. This procedure is also used to evaluate the valves, as well as measure the blood flow and oxygen levels in different parts of your heart. For further information please visit [email or web address]. Please follow instruction sheet, BELOW:     [address] [place], [place] [address] Dept: [phone] Loc: [place]  05/05/2020  You are scheduled for a Cardiac Catheterization on Wednesday, September 15 with Dr. Sherren Mocha.  1. Please arrive at the [facility] (Main Entrance A) at [facility]: [place], [place]  [address] at 11:30 AM (This time is two hours before your procedure to ensure your preparation). Free valet parking service is available.   Special note: Every effort is made to have your procedure done on time. Please understand that emergencies sometimes delay scheduled procedures.  2. Diet: Do not eat solid foods after midnight.  The patient may have clear liquids until 5am upon the day of the procedure.  3. Labs: You will  need to have blood drawn on TODAY    Due to recent COVID-19 restrictions implemented by our local and state authorities and in an effort to keep both patients and staff as safe as possible, our hospital system requires COVID-19 testing prior to certain scheduled hospital procedures.  Please go to [facility]. [place], [place] [address] on 05/08/20 AT 12:30.  This is a drive up testing site.  You will not need to exit your vehicle.  You will not be billed at the time of testing but may receive a bill later depending on your insurance. You must agree to self-quarantine from the time of your testing until the procedure date on 05/10/20.  This should included staying home with ONLY the people you live with.  Avoid take-out, grocery store shopping or leaving the house for any non-emergent reason.  Failure to have your COVID-19 test done on the date and time you have been scheduled will result in cancellation of your procedure.  Please call our office at [phone] if you have any questions.   4. Medication instructions in preparation for your procedure:   Contrast Allergy: No  Take only 4 units of insulin the night before your procedure. Do not take any insulin on the day of the procedure.  Do not take Diabetes Med Glucophage (Metformin) on the day of the procedure and HOLD 48 HOURS AFTER THE PROCEDURE.  On the morning of your procedure, take your Aspirin and any morning medicines NOT listed above.  You may use sips of water.  5. Plan for one night stay--bring personal belongings. 6. Bring a current list of your medications and current insurance cards. 7. You MUST have a responsible person to drive you home. 8. Someone MUST be with you the first 24 hours after you arrive home or your discharge will be delayed. 9. Please wear clothes that are easy to get on and off and wear slip-on shoes.  Thank you for allowing [place] to care for you!   -- [facility]  [facility] .     Follow-Up: At [facility], you and your health needs are our priority.  As part of our continuing mission to provide you with exceptional heart care, we have created designated Provider Care Teams.  These Care Teams include your primary Cardiologist (physician) and Advanced Practice Providers (APPs -  Physician Assistants and Nurse Practitioners) who all work together to provide you with the care you need, when you need it.  We recommend signing up for the patient portal called "MyChart".  Sign up information is provided on this After Visit Summary.  MyChart is used to connect with patients for Virtual Visits (Telemedicine).  Patients are able to view lab/test results, encounter notes, upcoming appointments, etc.  Non-urgent messages can be sent to your provider as well.   To learn more about what you can do with MyChart, go to [email or web address].    Your next appointment:   1 month(s)  The format for your next appointment:   In Person  Provider:  Berniece Salines, DO   Other Instructions      Adopting a Healthy Lifestyle.  Know what a healthy weight is for you (roughly BMI <25) and aim to maintain this   Aim for 7+ servings of fruits and vegetables daily   65-80+ fluid ounces of water or unsweet tea for healthy kidneys   Limit to max 1 drink of alcohol per day; avoid smoking/tobacco   Limit animal fats in diet for cholesterol and heart health - choose grass fed whenever available   Avoid highly processed foods, and foods high in saturated/trans fats   Aim for low stress - take time to unwind and care for your mental health   Aim for 150 min of moderate intensity exercise weekly for heart health, and weights twice weekly for bone health   Aim for 7-9 hours of sleep daily   When it comes to diets, agreement about the perfect plan isnt easy to find, even among the experts. Experts at the [employer] developed an idea known as the  Healthy Eating Plate. Just imagine a plate divided into logical, healthy portions.   The emphasis is on diet quality:   Load up on vegetables and fruits - one-half of your plate: Aim for color and variety, and remember that potatoes dont count.   Go for whole grains - one-quarter of your plate: Whole wheat, barley, wheat berries, quinoa, oats, brown rice, and foods made with them. If you want pasta, go with whole wheat pasta.   Protein power - one-quarter of your plate: Fish, chicken, beans, and nuts are all healthy, versatile protein sources. Limit red meat.   The diet, however, does go beyond the plate, offering a few other suggestions.   Use healthy plant oils, such as olive, canola, soy, corn, sunflower and peanut. Check the labels, and avoid partially hydrogenated oil, which have unhealthy trans fats.   If youre thirsty, drink water. Coffee and tea are good in moderation, but skip sugary drinks and limit milk and dairy products to one or two daily servings.   The type of carbohydrate in the diet is more important than the amount. Some sources of carbohydrates, such as vegetables, fruits, whole grains, and beans-are healthier than others.   Finally, stay active  Signed, Berniece Salines, DO  05/05/2020 10:13 AM    [place]

## 2020-05-05 NOTE — Progress Notes (Signed)
Cardiology Office Note:    Date:  05/05/2020   ID:  [name], DOB 26-Apr-1951, MRN [id number]  PCP:  Colon Branch, MD  Cardiologist:  No primary care provider on file.  Electrophysiologist:  None   Referring MD: Colon Branch, MD   " I am still tired"   History of Present Illness:    [name] is a 69 y.o. male with a hx of coronary artery disease status post CABG in 2019, ischemic cardiomyopathy most recent EF on March 27, 2020 at which time his EF was reported to be 25 to 30%, Status post aortic valve replacement with a 23 Edwards valve also in July 2019, hypertension hyperlipidemia and diabetes.  The patient was admitted at the [facility] and discharged on March 27, 2020.  He was treated for hypoglycemia.  While in the hospital he reported significant fatigue his echocardiogram was repeated which showed a depressed ejection fraction compared to prior.  Post hospitalization he was asked to see his primary cardiologist.  I saw the patient on April 05, 2020 at that time we kept him on his losartan as well as his beta-blocker.  Add Aldactone.  He is here today for follow-up visit he tells me that he has been significantly fatigued with shortness of breath.  He was able prior to mow his lawn which at this time he only goes having gets significantly tired. He has no chest pain at this time.   Past Medical History:  Diagnosis Date  . Abnormal myocardial perfusion study 2006   EF 44% ? inferoseptal ischemia. no cath done  . Benign neoplasm of cecum   . Breast mass, right 03/2008  . Cardiomyopathy ([place]) 07/20/2018   Ejection fraction 4045% in September 2019  . Centrilobular emphysema ([place]) 08/25/2018   02/04/2018-high-res CT- no evidence of interstitial lung disease, severe centrilobular emphysema and mild diffuse bronchial wall thickening suggesting COPD, no calcified pleural plaques no pleural effusion, mild smooth bilateral pleural thickening which is nonspecific   02/04/2018-pulmonary function test- FVC 3.37 (71% predicted), postbronchodilator ratio 50, postbronchodilator FEV1 54, mid flow reve  . COLONIC POLYPS, HX OF 03/18/2007   Qualifier: Diagnosis of  By: [name]   . Contact dermatitis 01/31/2013  . COPD (chronic obstructive pulmonary disease) ([place])    "dx'd 12/2017"  . COPD GOLD II A  08/25/2018   02/04/2018-high-res CT- no evidence of interstitial lung disease, severe centrilobular emphysema and mild diffuse bronchial wall thickening suggesting COPD, no calcified pleural plaques no pleural effusion, mild smooth bilateral pleural thickening which is nonspecific  02/04/2018-pulmonary function test- FVC 3.37 (71% predicted), postbronchodilator ratio 50, postbronchodilator FEV1 54, mid flow reve  . Coronary artery disease   . Critical aortic valve stenosis 01/12/2018   Calculated valve area less than 0.5 cm mean gradient more than 60 mm of mercury  . DEGENERATIVE JOINT DISEASE, BACK 03/18/2007   Qualifier: Diagnosis of  By: [name]   . Depressed left ventricular ejection fraction 03/28/2020  . Dyspnea on exertion 01/12/2018  . ERECTILE DYSFUNCTION, ORGANIC 02/10/2009   Qualifier: Diagnosis of  By: [name]   . Heart murmur   . HEMORRHOIDS, INTERNAL 03/18/2007   Qualifier: Diagnosis of  By: [name]   . Hyperlipemia 10/21/2007   Qualifier: Diagnosis of  By: [name]   . Hypertension   . Hypoglycemia due to insulin 03/26/2020  . Low back pain   .  MYOCARDIAL PERFUSION SCAN, WITH STRESS TEST, ABNORMAL 08/11/2008   Qualifier: Diagnosis of  By: Johnsie Cancel, MD, Rona Ravens   . Nocturnal hypoxemia 08/25/2018   04/09/18 >>> ONO - telephone note>>> Patient's ONO on room air showed that he was less than 88% for more than an hour. Patient needs to be on 2 L at HS.  10/06/2018-overnight oximetry-entire duration was 4 hours and 23 minutes, SPO2 less than 88% for 25 minutes and 32 seconds    . Oxygen deficiency    uses 2L at  night  . Paroxysmal atrial fibrillation ([place]) 10/12/2018  . S/P AVR 03/04/2018   AVR 23 mm Edwards Magna Valve - bioprostetic 03/04/2018 LIMA - LAD  . Status post coronary artery bypass graft 03/20/2018   LIMA to LAD during aortic valve replacement surgery 03/04/2018  . Type 2 diabetes mellitus, uncontrolled, with neuropathy ([place]) 05/20/2008   Qualifier: Diagnosis of  By: [name]     Past Surgical History:  Procedure Laterality Date  . ABDOMINAL AORTOGRAM N/A 01/13/2018   Procedure: ABDOMINAL AORTOGRAM;  Surgeon: Troy Sine, MD;  Location: [place] [facility];  Service: Cardiovascular;  Laterality: N/A;  . AORTIC VALVE REPLACEMENT N/A 03/04/2018   Procedure: AORTIC VALVE REPLACEMENT (AVR) 62mm Edwards Magna Ease Tissue Valve.;  Surgeon: Melrose Nakayama, MD;  Location: [place] [facility];  Service: Open Heart Surgery;  Laterality: N/A;  . CARDIAC VALVE REPLACEMENT    . COLONOSCOPY W/ POLYPECTOMY    . COLONOSCOPY WITH PROPOFOL N/A 02/22/2020   Procedure: COLONOSCOPY WITH PROPOFOL;  Surgeon: Doran Stabler, MD;  Location: [facility];  Service: Gastroenterology;  Laterality: N/A;  . CORONARY ARTERY BYPASS GRAFT N/A 03/04/2018   Procedure: CORONARY ARTERY BYPASS GRAFTING (CABG) x1:  LIMA to LAD.;  Surgeon: Melrose Nakayama, MD;  Location: [place];  Service: Open Heart Surgery;  Laterality: N/A;  . HEMOSTASIS CLIP PLACEMENT  02/22/2020   Procedure: HEMOSTASIS CLIP PLACEMENT;  Surgeon: Doran Stabler, MD;  Location: [facility];  Service: Gastroenterology;;  . LIPOMA EXCISION Left 07/2008    "back of shoulder" Dr Gershon Crane  . POLYPECTOMY  02/22/2020   Procedure: POLYPECTOMY;  Surgeon: Doran Stabler, MD;  Location: [facility];  Service: Gastroenterology;;  . RIGHT/LEFT HEART CATH AND CORONARY ANGIOGRAPHY N/A 01/13/2018   Procedure: RIGHT/LEFT HEART CATH AND CORONARY ANGIOGRAPHY;  Surgeon: Troy Sine, MD;  Location: [place] [facility];  Service: Cardiovascular;  Laterality: N/A;    . TEE WITHOUT CARDIOVERSION N/A 03/04/2018   Procedure: TRANSESOPHAGEAL ECHOCARDIOGRAM (TEE);  Surgeon: Melrose Nakayama, MD;  Location: [place];  Service: Open Heart Surgery;  Laterality: N/A;  . TONSILLECTOMY    . WISDOM TOOTH EXTRACTION      Current Medications: Current Meds  Medication Sig  . aspirin EC 325 MG EC tablet Take 1 tablet (325 mg total) by mouth daily.  . cetirizine (ZYRTEC) 10 MG tablet Take 10 mg by mouth daily as needed for allergies.   . Cyanocobalamin (VITAMIN B-12 PO) Take 1 tablet by mouth daily.  Marland Kitchen FARXIGA 5 MG TABS tablet Take 5 mg by mouth daily.  Marland Kitchen glucose blood (PRODIGY NO CODING BLOOD GLUC) test strip Use as instructed to check blood sugar twice a day.  DX  E11.40  . hydrALAZINE (APRESOLINE) 10 MG tablet TAKE 1 TABLET 3 TIMES A DAY  . insulin aspart (NOVOLOG FLEXPEN) 100 UNIT/ML FlexPen Inject 8 Units into the skin 3 (three) times daily with meals. Inject  10 units under the skin once daily before dinner.  . insulin detemir (LEVEMIR FLEXTOUCH) 100 UNIT/ML FlexPen Inject 20 Units into the skin daily. Inject 25 units under the skin once daily.  . Insulin Pen Needle 32G X 4 MM MISC 1 Device by Does not apply route as directed.  Marland Kitchen losartan (COZAAR) 25 MG tablet Take 1 tablet (25 mg total) by mouth daily.  . metFORMIN (GLUCOPHAGE) 1000 MG tablet Take 500 mg by mouth daily. Take one half tablet (500mg  total) by mouth once daily.  . metoprolol tartrate (LOPRESSOR) 50 MG tablet Take 50 mg by mouth 2 (two) times daily.  . rosuvastatin (CRESTOR) 20 MG tablet Take 1 tablet (20 mg total) by mouth daily.  Marland Kitchen spironolactone (ALDACTONE) 25 MG tablet Take 0.5 tablets (12.5 mg total) by mouth daily.     Allergies:   Amlodipine besylate, Hydrochlorothiazide, and Lisinopril   Social History   Socioeconomic History  . Marital status: Married    Spouse name: Not on file  . Number of children: Not on file  . Years of education: Not on file  . Highest education level: Not on  file  Occupational History  . Not on file  Tobacco Use  . Smoking status: Former Smoker    Packs/day: 1.50    Years: 41.00    Pack years: 61.50    Types: Cigarettes    Quit date: 08/26/2010    Years since quitting: 9.6  . Smokeless tobacco: Never Used  . Tobacco comment: Quit 2012  Vaping Use  . Vaping Use: Never used  Substance and Sexual Activity  . Alcohol use: Yes    Comment: occasionally  . Drug use: Not Currently  . Sexual activity: Not Currently  Other Topics Concern  . 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 Difficulty of Paying Living Expenses: Not hard at all  Food Insecurity: No Food Insecurity  . Worried About Charity fundraiser in the Last Year: Never true  . Ran Out of Food in the Last Year: Never true  Transportation Needs: No Transportation Needs  . Lack of Transportation (Medical): No  . Lack of Transportation (Non-Medical): No  Physical Activity:   . Days of Exercise per Week: Not on file  . Minutes of Exercise per Session: Not on file  Stress:   . Feeling of Stress : Not on file  Social Connections:   . Frequency of Communication with Friends and Family: Not on file  . Frequency of Social Gatherings with Friends and Family: Not on file  . Attends Religious Services: Not on file  . Active Member of Clubs or Organizations: Not on file  . Attends Archivist Meetings: Not on file  . Marital Status: Not on file     Family History: The patient's family history includes Alzheimer's disease in his father; Diabetes in his father; Hypertension in his mother; Melanoma (age of onset: 57) in his father. There is no history of Colon cancer, Esophageal cancer, Colon polyps, or Stomach cancer.  ROS:   Review of Systems  Constitution: Negative for  decreased appetite, fever and weight gain.  HENT: Negative for congestion, ear discharge, hoarse  voice and sore throat.   Eyes: Negative for discharge, redness, vision loss in right eye and visual halos.  Cardiovascular: Negative for chest pain, dyspnea on exertion, leg swelling, orthopnea and palpitations.  Respiratory: Negative for cough, hemoptysis, shortness of breath and snoring.   Endocrine: Negative for heat intolerance and polyphagia.  Hematologic/Lymphatic: Negative for bleeding problem. Does not bruise/bleed easily.  Skin: Negative for flushing, nail changes, rash and suspicious lesions.  Musculoskeletal: Negative for arthritis, joint pain, muscle cramps, myalgias, neck pain and stiffness.  Gastrointestinal: Negative for abdominal pain, bowel incontinence, diarrhea and excessive appetite.  Genitourinary: Negative for decreased libido, genital sores and incomplete emptying.  Neurological: Negative for brief paralysis, focal weakness, headaches and loss of balance.  Psychiatric/Behavioral: Negative for altered mental status, depression and suicidal ideas.  Allergic/Immunologic: Negative for HIV exposure and persistent infections.    EKGs/Labs/Other Studies Reviewed:    The following studies were reviewed today:   EKG: None today.  Echocardiogram IMPRESSIONS  1. Left ventricular ejection fraction, by estimation, is 25 to 30%. The left ventricle has severely decreased function. The left ventricle demonstrates regional wall motion abnormalities (see scoring diagram/findings for description). There is moderate left ventricular hypertrophy. Left ventricular diastolic parameters are  consistent with Grade I diastolic dysfunction (impaired relaxation).  2. Right ventricular systolic function is normal. The right ventricular  size is normal. Tricuspid regurgitation signal is inadequate for assessing  PA pressure.  3. The mitral valve is normal in structure. Trivial mitral valve    regurgitation. No evidence of mitral stenosis.  4. The aortic valve has been repaired/replaced. Aortic valve  regurgitation is not visualized. No aortic stenosis is present. There is a  23 mm [employer] valve present in the aortic position. Procedure  Date: 03/04/2018. Aortic valve mean gradient  measures 10.0 mmHg.  5. The inferior vena cava is normal in size with <50% respiratory  variability, suggesting right atrial pressure of 8 mmHg.   Recent Labs: 03/26/2020: ALT 13; TSH 0.574 03/31/2020: BUN 8; Creatinine, Ser 0.76; Hemoglobin 15.1; Platelets 425.0; Potassium 3.9; Sodium 138  Recent Lipid Panel    Component Value Date/Time   CHOL 127 03/23/2020 1129   TRIG 66.0 03/23/2020 1129   HDL 50.00 03/23/2020 1129   CHOLHDL 3 03/23/2020 1129   VLDL 13.2 03/23/2020 1129   LDLCALC 64 03/23/2020 1129   LDLDIRECT 177.6 10/07/2007 1001    Physical Exam:    VS:  BP 132/90   Pulse 68   Ht 6\' 4"  (1.93 m)   Wt 174 lb (78.9 kg)   SpO2 96%   BMI 21.18 kg/m     Wt Readings from Last 3 Encounters:  05/05/20 174 lb (78.9 kg)  04/19/20 172 lb 6.4 oz (78.2 kg)  04/05/20 174 lb (78.9 kg)     GEN: Well nourished, well developed in no acute distress HEENT: Normal NECK: No JVD; No carotid bruits LYMPHATICS: No lymphadenopathy CARDIAC: S1S2 noted,RRR, no murmurs, rubs, gallops RESPIRATORY:  Clear to auscultation without rales, wheezing or rhonchi  ABDOMEN: Soft, non-tender, non-distended, +bowel sounds, no guarding. EXTREMITIES: No edema, No cyanosis, no clubbing MUSCULOSKELETAL:  No deformity  SKIN: Warm and dry NEUROLOGIC:  Alert and oriented x 3, non-focal PSYCHIATRIC:  Normal affect, good insight  ASSESSMENT:    1. Depressed left ventricular ejection fraction   2. Dyspnea on exertion   3. Essential hypertension   4. Coronary artery disease involving native coronary artery of native heart without angina pectoris   5. Paroxysmal  atrial fibrillation ([place])   6. Type 2  diabetes mellitus with complication, with long-term current use of insulin ([place])   7. H/O aortic valve replacement    PLAN:    This patient is to have some high risk for progression of coronary artery disease given his diabetes, with a recent drop in his EF as well as his progressive symptoms of shortness of breath and fatigue I like to proceed with a left heart catheterization in this patient.  I have educated the patient and his wife about the procedure.  All of their questions has been answered.  The patient understands that risks include but are not limited to stroke (1 in 1000), death (1 in 19), kidney failure [usually temporary] (1 in 500), bleeding (1 in 200), allergic reaction [possibly serious] (1 in 200), and agrees to proceed.   He will remain on his current medication regimen includes losartan 25 mg daily, Toprol-XL 50 mg daily, Aldactone 25 mg a day.  He is also on [place].  Post cath I intend to convert the patient from losartan to [facility].  His blood pressure is improved compared to prior visit.  Blood work will be done today to assess BMP, mag, CBC.  Diabetes mellitus continue medication per PCP.  The patient is in agreement with the above plan. The patient left the office in stable condition.  The patient will follow up in 1 month or sooner if needed.   Medication Adjustments/Labs and Tests Ordered: Current medicines are reviewed at length with the patient today.  Concerns regarding medicines are outlined above.  Orders Placed This Encounter  Procedures  . Basic metabolic panel  . CBC  . Magnesium   No orders of the defined types were placed in this encounter.   Patient Instructions  Medication Instructions:  Your physician recommends that you continue on your current medications as directed. Please refer to the Current Medication list given to you today.  *If you need a refill on your cardiac medications before your next appointment, please call your  pharmacy*   Lab Work: TODAY:  BMET, CBC, & MAGNESIUM  If you have labs (blood work) drawn today and your tests are completely normal, you will receive your results only by: Marland Kitchen MyChart Message (if you have MyChart) OR . A paper copy in the mail If you have any lab test that is abnormal or we need to change your treatment, we will call you to review the results.   Testing/Procedures: Your physician has requested that you have a cardiac catheterization. Cardiac catheterization is used to diagnose and/or treat various heart conditions. Doctors may recommend this procedure for a number of different reasons. The most common reason is to evaluate chest pain. Chest pain can be a symptom of coronary artery disease (CAD), and cardiac catheterization can show whether plaque is narrowing or blocking your heart's arteries. This procedure is also used to evaluate the valves, as well as measure the blood flow and oxygen levels in different parts of your heart. For further information please visit [email or web address]. Please follow instruction sheet, BELOW:     [name] [address], [place] [address] Dept: [phone] Loc: [place]  05/05/2020  You are scheduled for a Cardiac Catheterization on Wednesday, September 15 with Dr. Sherren Mocha.  1. Please arrive at the [facility] (Main Entrance A) at [facility]: [place], [place]  [address] at 11:30 AM (This time is two hours before your procedure to ensure your preparation). Free valet parking service is available.   Special note: Every effort is made to have your procedure done on time. Please understand that emergencies sometimes delay scheduled procedures.  2. Diet: Do not eat solid foods after midnight.  The patient may have clear liquids until 5am upon the day of the procedure.  3. Labs: You will  need to have blood drawn on TODAY    Due to recent COVID-19 restrictions implemented by our local and state authorities and in an effort to keep both patients and staff as safe as possible, our hospital system requires COVID-19 testing prior to certain scheduled hospital procedures.  Please go to [address] [place], [place] [phone] on 05/08/20 AT 12:30.  This is a drive up testing site.  You will not need to exit your vehicle.  You will not be billed at the time of testing but may receive a bill later depending on your insurance. You must agree to self-quarantine from the time of your testing until the procedure date on 05/10/20.  This should included staying home with ONLY the people you live with.  Avoid take-out, grocery store shopping or leaving the house for any non-emergent reason.  Failure to have your COVID-19 test done on the date and time you have been scheduled will result in cancellation of your procedure.  Please call our office at [phone] if you have any questions.   4. Medication instructions in preparation for your procedure:   Contrast Allergy: No  Take only 4 units of insulin the night before your procedure. Do not take any insulin on the day of the procedure.  Do not take Diabetes Med Glucophage (Metformin) on the day of the procedure and HOLD 48 HOURS AFTER THE PROCEDURE.  On the morning of your procedure, take your Aspirin and any morning medicines NOT listed above.  You may use sips of water.  5. Plan for one night stay--bring personal belongings. 6. Bring a current list of your medications and current insurance cards. 7. You MUST have a responsible person to drive you home. 8. Someone MUST be with you the first 24 hours after you arrive home or your discharge will be delayed. 9. Please wear clothes that are easy to get on and off and wear slip-on shoes.  Thank you for allowing [place] to care for you!   -- [facility]  [facility] .     Follow-Up: At [facility], you and your health needs are our priority.  As part of our continuing mission to provide you with exceptional heart care, we have created designated Provider Care Teams.  These Care Teams include your primary Cardiologist (physician) and Advanced Practice Providers (APPs -  Physician Assistants and Nurse Practitioners) who all work together to provide you with the care you need, when you need it.  We recommend signing up for the patient portal called "MyChart".  Sign up information is provided on this After Visit Summary.  MyChart is used to connect with patients for Virtual Visits (Telemedicine).  Patients are able to view lab/test results, encounter notes, upcoming appointments, etc.  Non-urgent messages can be sent to your provider as well.   To learn more about what you can do with MyChart, go to [email or web address].    Your next appointment:   1 month(s)  The format for your next appointment:   In Person  Provider:  Berniece Salines, DO   Other Instructions      Adopting a Healthy Lifestyle.  Know what a healthy weight is for you (roughly BMI <25) and aim to maintain this   Aim for 7+ servings of fruits and vegetables daily   65-80+ fluid ounces of water or unsweet tea for healthy kidneys   Limit to max 1 drink of alcohol per day; avoid smoking/tobacco   Limit animal fats in diet for cholesterol and heart health - choose grass fed whenever available   Avoid highly processed foods, and foods high in saturated/trans fats   Aim for low stress - take time to unwind and care for your mental health   Aim for 150 min of moderate intensity exercise weekly for heart health, and weights twice weekly for bone health   Aim for 7-9 hours of sleep daily   When it comes to diets, agreement about the perfect plan isnt easy to find, even among the experts. Experts at the [employer] developed an idea known as the  Healthy Eating Plate. Just imagine a plate divided into logical, healthy portions.   The emphasis is on diet quality:   Load up on vegetables and fruits - one-half of your plate: Aim for color and variety, and remember that potatoes dont count.   Go for whole grains - one-quarter of your plate: Whole wheat, barley, wheat berries, quinoa, oats, brown rice, and foods made with them. If you want pasta, go with whole wheat pasta.   Protein power - one-quarter of your plate: Fish, chicken, beans, and nuts are all healthy, versatile protein sources. Limit red meat.   The diet, however, does go beyond the plate, offering a few other suggestions.   Use healthy plant oils, such as olive, canola, soy, corn, sunflower and peanut. Check the labels, and avoid partially hydrogenated oil, which have unhealthy trans fats.   If youre thirsty, drink water. Coffee and tea are good in moderation, but skip sugary drinks and limit milk and dairy products to one or two daily servings.   The type of carbohydrate in the diet is more important than the amount. Some sources of carbohydrates, such as vegetables, fruits, whole grains, and beans-are healthier than others.   Finally, stay active  Signed, Berniece Salines, DO  05/05/2020 10:13 AM    [place]

## 2020-05-05 NOTE — Patient Instructions (Addendum)
Medication Instructions:  Your physician recommends that you continue on your current medications as directed. Please refer to the Current Medication list given to you today.  *If you need a refill on your cardiac medications before your next appointment, please call your pharmacy*   Lab Work: TODAY:  BMET, CBC, & MAGNESIUM  If you have labs (blood work) drawn today and your tests are completely normal, you will receive your results only by: Marland Kitchen MyChart Message (if you have MyChart) OR . A paper copy in the mail If you have any lab test that is abnormal or we need to change your treatment, we will call you to review the results.   Testing/Procedures: Your physician has requested that you have a cardiac catheterization. Cardiac catheterization is used to diagnose and/or treat various heart conditions. Doctors may recommend this procedure for a number of different reasons. The most common reason is to evaluate chest pain. Chest pain can be a symptom of coronary artery disease (CAD), and cardiac catheterization can show whether plaque is narrowing or blocking your heart's arteries. This procedure is also used to evaluate the valves, as well as measure the blood flow and oxygen levels in different parts of your heart. For further information please visit [email or web address]. Please follow instruction sheet, BELOW:     [facility] [address] [place], [place] [address] Dept: [phone] Loc: [place]  05/05/2020  You are scheduled for a Cardiac Catheterization on Wednesday, September 15 with Dr. Sherren Mocha.  1. Please arrive at the [facility] (Main Entrance A) at [facility]: [address] [place], [place] [address] at 11:30 AM (This time is two hours before your procedure to ensure your preparation). Free valet parking service is available.   Special note: Every  effort is made to have your procedure done on time. Please understand that emergencies sometimes delay scheduled procedures.  2. Diet: Do not eat solid foods after midnight.  The patient may have clear liquids until 5am upon the day of the procedure.  3. Labs: You will need to have blood drawn on TODAY    Due to recent COVID-19 restrictions implemented by our local and state authorities and in an effort to keep both patients and staff as safe as possible, our hospital system requires COVID-19 testing prior to certain scheduled hospital procedures.  Please go to [place]. [place],  [id number] on 05/08/20 AT 12:30.  This is a drive up testing site.  You will not need to exit your vehicle.  You will not be billed at the time of testing but may receive a bill later depending on your insurance. You must agree to self-quarantine from the time of your testing until the procedure date on 05/10/20.  This should included staying home with ONLY the people you live with.  Avoid take-out, grocery store shopping or leaving the house for any non-emergent reason.  Failure to have your COVID-19 test done on the date and time you have been scheduled will result in cancellation of your procedure.  Please call our office at [phone] if you have any questions.   4. Medication instructions in preparation for your procedure:   Contrast Allergy: No  Take only 4 units of insulin the night before your procedure. Do not take any insulin on the day of the procedure.  Do not take Diabetes Med Glucophage (Metformin) on the day of the  procedure and HOLD 48 HOURS AFTER THE PROCEDURE.  On the morning of your procedure, take your Aspirin and any morning medicines NOT listed above.  You may use sips of water.  5. Plan for one night stay--bring personal belongings. 6. Bring a current list of your medications and current insurance cards. 7. You MUST have a responsible person to drive you home. 8. Someone MUST be  with you the first 24 hours after you arrive home or your discharge will be delayed. 9. Please wear clothes that are easy to get on and off and wear slip-on shoes.  Thank you for allowing [place] to care for you!   -- [facility] .     Follow-Up: At [facility], you and your health needs are our priority.  As part of our continuing mission to provide you with exceptional heart care, we have created designated Provider Care Teams.  These Care Teams include your primary Cardiologist (physician) and Advanced Practice Providers (APPs -  Physician Assistants and Nurse Practitioners) who all work together to provide you with the care you need, when you need it.  We recommend signing up for the patient portal called "MyChart".  Sign up information is provided on this After Visit Summary.  MyChart is used to connect with patients for Virtual Visits (Telemedicine).  Patients are able to view lab/test results, encounter notes, upcoming appointments, etc.  Non-urgent messages can be sent to your provider as well.   To learn more about what you can do with MyChart, go to [email or web address].    Your next appointment:   1 month(s)  The format for your next appointment:   In Person  Provider:   Berniece Salines, DO   Other Instructions

## 2020-05-05 NOTE — Telephone Encounter (Signed)
Spoke to patients wife just now and she let me know that the patient has been taking Lopressor but was switched over to metoprolol succinate while in the hospital on 03/28/20. She needed a refill for this medication and this was sent in for them at this time.   Encouraged patient to call back with any questions or concerns.

## 2020-05-05 NOTE — Telephone Encounter (Signed)
New message:     Patient wife calling stating medication was fill. Please call patient back.

## 2020-05-06 LAB — BASIC METABOLIC PANEL
BUN/Creatinine Ratio: 13 (ref 10–24)
BUN: 11 mg/dL (ref 8–27)
CO2: 25 mmol/L (ref 20–29)
Calcium: 9.4 mg/dL (ref 8.6–10.2)
Chloride: 103 mmol/L (ref 96–106)
Creatinine, Ser: 0.82 mg/dL (ref 0.76–1.27)
GFR calc Af Amer: 105 mL/min/{1.73_m2} (ref >59)
GFR calc non Af Amer: 91 mL/min/{1.73_m2} (ref >59)
Glucose: 119 mg/dL — ABNORMAL HIGH (ref 65–99)
Potassium: 4.9 mmol/L (ref 3.5–5.2)
Sodium: 140 mmol/L (ref 134–144)

## 2020-05-06 LAB — CBC
Hematocrit: 52.3 % — ABNORMAL HIGH (ref 37.5–51.0)
Hemoglobin: 15.9 g/dL (ref 13.0–17.7)
MCH: 22.8 pg — ABNORMAL LOW (ref 26.6–33.0)
MCHC: 30.4 g/dL — ABNORMAL LOW (ref 31.5–35.7)
MCV: 75 fL — ABNORMAL LOW (ref 79–97)
Platelets: 525 10*3/uL — ABNORMAL HIGH (ref 150–450)
RBC: 6.97 x10E6/uL — ABNORMAL HIGH (ref 4.14–5.80)
RDW: 16.9 % — ABNORMAL HIGH (ref 11.6–15.4)
WBC: 6.6 10*3/uL (ref 3.4–10.8)

## 2020-05-06 LAB — MAGNESIUM: Magnesium: 1.7 mg/dL (ref 1.6–2.3)

## 2020-05-08 ENCOUNTER — Other Ambulatory Visit (HOSPITAL_COMMUNITY)
Admission: RE | Admit: 2020-05-08 | Discharge: 2020-05-08 | Disposition: A | Discharge status: Home / Self Care | Payer: Medicare Other | Source: Ambulatory Visit | Attending: Cardiovascular Disease | Admitting: Cardiovascular Disease

## 2020-05-08 ENCOUNTER — Other Ambulatory Visit: Payer: Self-pay

## 2020-05-08 ENCOUNTER — Telehealth: Payer: Self-pay | Admitting: Cardiology

## 2020-05-08 DIAGNOSIS — Z20822 Contact with and (suspected) exposure to covid-19: Secondary | ICD-10-CM | POA: Diagnosis not present

## 2020-05-08 DIAGNOSIS — Z01812 Encounter for preprocedural laboratory examination: Secondary | ICD-10-CM | POA: Diagnosis not present

## 2020-05-08 LAB — SARS CORONAVIRUS 2 (TAT 6-24 HRS): SARS Coronavirus 2: NEGATIVE

## 2020-05-08 NOTE — Telephone Encounter (Signed)
Called patient informed him of lab results per Dr.Tobb. No further questions.

## 2020-05-08 NOTE — Telephone Encounter (Signed)
Follow Up:    Pt is returning Hayley's call about his lab results.

## 2020-05-09 ENCOUNTER — Telehealth: Payer: Self-pay | Admitting: *Deleted

## 2020-05-09 NOTE — Telephone Encounter (Signed)
Pt contacted pre-catheterization scheduled at [name] for: Wednesday May 10, 2020 1:30 PM Verified arrival time and place: [facility]) at: 11:30 AM   No solid food after midnight prior to cath, clear liquids until 5 AM day of procedure.  Hold: Insulin-AM of procedure -pt states does not use HS Insulin Farxiga-AM of procedure Metformin-day of procedure and 48 hours post procedure. Spironolactone-AM of procedure  Except hold medications AM meds can be  taken pre-cath with sips of water including: ASA 81 mg   Confirmed patient has responsible adult to drive home post procedure and be with patient first 24 hours after arriving home: yes  You are allowed ONE visitor in the waiting room during the time you are at the hospital for your procedure. Both you and your visitor must wear a mask once you enter the hospital.       COVID-19 Pre-Screening Questions:   In the past 10 days have you had a new cough, shortness of breath, headache, congestion, fever (100 or greater) unexplained body aches, new sore throat, or sudden loss of taste or sense of smell? no  In the past 10 days have you been around anyone with known Covid 19? no  Have you been vaccinated for COVID-19? Yes, see immunization history   Reviewed procedure/mask/visitor instructions, COVID-19 questions with patient.

## 2020-05-10 ENCOUNTER — Other Ambulatory Visit: Payer: Self-pay

## 2020-05-10 ENCOUNTER — Encounter (HOSPITAL_COMMUNITY)
Admission: RE | Disposition: A | Discharge status: Home / Self Care | Payer: Self-pay | Source: Home / Self Care | Attending: Cardiovascular Disease

## 2020-05-10 ENCOUNTER — Ambulatory Visit (HOSPITAL_COMMUNITY)
Admission: RE | Admit: 2020-05-10 | Discharge: 2020-05-10 | Disposition: A | Discharge status: Home / Self Care | Payer: Medicare Other | Attending: Cardiovascular Disease | Admitting: Cardiovascular Disease

## 2020-05-10 DIAGNOSIS — Z79899 Other long term (current) drug therapy: Secondary | ICD-10-CM | POA: Diagnosis not present

## 2020-05-10 DIAGNOSIS — E785 Hyperlipidemia, unspecified: Secondary | ICD-10-CM | POA: Insufficient documentation

## 2020-05-10 DIAGNOSIS — I10 Essential (primary) hypertension: Secondary | ICD-10-CM | POA: Insufficient documentation

## 2020-05-10 DIAGNOSIS — R0609 Other forms of dyspnea: Secondary | ICD-10-CM | POA: Insufficient documentation

## 2020-05-10 DIAGNOSIS — Z87891 Personal history of nicotine dependence: Secondary | ICD-10-CM | POA: Diagnosis not present

## 2020-05-10 DIAGNOSIS — E1140 Type 2 diabetes mellitus with diabetic neuropathy, unspecified: Secondary | ICD-10-CM | POA: Insufficient documentation

## 2020-05-10 DIAGNOSIS — Z794 Long term (current) use of insulin: Secondary | ICD-10-CM | POA: Insufficient documentation

## 2020-05-10 DIAGNOSIS — J449 Chronic obstructive pulmonary disease, unspecified: Secondary | ICD-10-CM | POA: Insufficient documentation

## 2020-05-10 DIAGNOSIS — I2510 Atherosclerotic heart disease of native coronary artery without angina pectoris: Secondary | ICD-10-CM | POA: Diagnosis not present

## 2020-05-10 DIAGNOSIS — I420 Dilated cardiomyopathy: Secondary | ICD-10-CM

## 2020-05-10 DIAGNOSIS — I255 Ischemic cardiomyopathy: Secondary | ICD-10-CM | POA: Insufficient documentation

## 2020-05-10 DIAGNOSIS — I429 Cardiomyopathy, unspecified: Secondary | ICD-10-CM

## 2020-05-10 DIAGNOSIS — Z952 Presence of prosthetic heart valve: Secondary | ICD-10-CM | POA: Diagnosis not present

## 2020-05-10 DIAGNOSIS — Z7982 Long term (current) use of aspirin: Secondary | ICD-10-CM | POA: Insufficient documentation

## 2020-05-10 DIAGNOSIS — I480 Paroxysmal atrial fibrillation: Secondary | ICD-10-CM | POA: Diagnosis not present

## 2020-05-10 HISTORY — PX: LEFT HEART CATH AND CORS/GRAFTS ANGIOGRAPHY: CATH118250

## 2020-05-10 HISTORY — PX: CORONARY PRESSURE/FFR STUDY: CATH118243

## 2020-05-10 LAB — GLUCOSE, CAPILLARY: Glucose-Capillary: 148 mg/dL — ABNORMAL HIGH (ref 70–99)

## 2020-05-10 LAB — POCT ACTIVATED CLOTTING TIME: Activated Clotting Time: 252 seconds

## 2020-05-10 SURGERY — LEFT HEART CATH AND CORS/GRAFTS ANGIOGRAPHY
Anesthesia: LOCAL

## 2020-05-10 MED ORDER — SODIUM CHLORIDE 0.9% FLUSH: 3.0000 mL | INTRAVENOUS | Status: DC | PRN

## 2020-05-10 MED ORDER — FENTANYL CITRATE (PF) 100 MCG/2ML IJ SOLN
INTRAMUSCULAR | Status: DC | PRN
Start: 2020-05-10 — End: 2020-05-10
  Administered 2020-05-10: 25 ug via INTRAVENOUS

## 2020-05-10 MED ORDER — SODIUM CHLORIDE 0.9 % IV SOLN: INTRAVENOUS | Status: DC

## 2020-05-10 MED ORDER — HEPARIN SODIUM (PORCINE) 1000 UNIT/ML IJ SOLN
INTRAMUSCULAR | Status: AC
  Filled 2020-05-10: qty 1

## 2020-05-10 MED ORDER — LIDOCAINE HCL (PF) 1 % IJ SOLN
INTRAMUSCULAR | Status: DC | PRN
  Administered 2020-05-10: 2 mL via INTRADERMAL

## 2020-05-10 MED ORDER — MIDAZOLAM HCL 2 MG/2ML IJ SOLN
INTRAMUSCULAR | Status: DC | PRN
  Administered 2020-05-10: 2 mg via INTRAVENOUS

## 2020-05-10 MED ORDER — LABETALOL HCL 5 MG/ML IV SOLN: 10.0000 mg | INTRAVENOUS | Status: DC | PRN

## 2020-05-10 MED ORDER — HEPARIN (PORCINE) IN NACL 1000-0.9 UT/500ML-% IV SOLN
INTRAVENOUS | Status: AC
  Filled 2020-05-10: qty 1000

## 2020-05-10 MED ORDER — SODIUM CHLORIDE 0.9% FLUSH: 3.0000 mL | Freq: Two times a day (BID) | INTRAVENOUS | Status: DC

## 2020-05-10 MED ORDER — ACETAMINOPHEN 325 MG PO TABS: 650.0000 mg | ORAL_TABLET | ORAL | Status: DC | PRN

## 2020-05-10 MED ORDER — IOHEXOL 350 MG/ML SOLN
INTRAVENOUS | Status: DC | PRN
  Administered 2020-05-10: 80 mL via INTRA_ARTERIAL

## 2020-05-10 MED ORDER — MIDAZOLAM HCL 2 MG/2ML IJ SOLN
INTRAMUSCULAR | Status: AC
  Filled 2020-05-10: qty 2

## 2020-05-10 MED ORDER — LIDOCAINE HCL (PF) 1 % IJ SOLN
INTRAMUSCULAR | Status: AC
  Filled 2020-05-10: qty 30

## 2020-05-10 MED ORDER — SODIUM CHLORIDE 0.9 % IV SOLN: 250.0000 mL | INTRAVENOUS | Status: DC | PRN

## 2020-05-10 MED ORDER — ASPIRIN 81 MG PO CHEW: 81.0000 mg | CHEWABLE_TABLET | ORAL | Status: DC

## 2020-05-10 MED ORDER — FENTANYL CITRATE (PF) 100 MCG/2ML IJ SOLN
INTRAMUSCULAR | Status: AC
  Filled 2020-05-10: qty 2

## 2020-05-10 MED ORDER — HEPARIN (PORCINE) IN NACL 1000-0.9 UT/500ML-% IV SOLN
INTRAVENOUS | Status: DC | PRN
  Administered 2020-05-10 (×2): 500 mL

## 2020-05-10 MED ORDER — HYDRALAZINE HCL 20 MG/ML IJ SOLN: 10.0000 mg | INTRAMUSCULAR | Status: DC | PRN

## 2020-05-10 MED ORDER — VERAPAMIL HCL 2.5 MG/ML IV SOLN
INTRAVENOUS | Status: DC | PRN
  Administered 2020-05-10: 10 mL via INTRA_ARTERIAL

## 2020-05-10 MED ORDER — HEPARIN SODIUM (PORCINE) 1000 UNIT/ML IJ SOLN
INTRAMUSCULAR | Status: DC | PRN
  Administered 2020-05-10 (×2): 4000 [IU] via INTRAVENOUS

## 2020-05-10 MED ORDER — ONDANSETRON HCL 4 MG/2ML IJ SOLN: 4.0000 mg | Freq: Four times a day (QID) | INTRAMUSCULAR | Status: DC | PRN

## 2020-05-10 MED ORDER — VERAPAMIL HCL 2.5 MG/ML IV SOLN
INTRAVENOUS | Status: AC
  Filled 2020-05-10: qty 2

## 2020-05-10 SURGICAL SUPPLY — 13 items
CATH INFINITI 5FR MULTPACK ANG (CATHETERS) ×1 IMPLANT
CATH VISTA GUIDE 6FR XBLAD4 (CATHETERS) ×1 IMPLANT
DEVICE RAD COMP TR BAND LRG (VASCULAR PRODUCTS) ×1 IMPLANT
GLIDESHEATH SLEND SS 6F .021 (SHEATH) ×1 IMPLANT
GUIDEWIRE INQWIRE 1.5J.035X260 (WIRE) IMPLANT
GUIDEWIRE PRESSURE COMET II (WIRE) ×1 IMPLANT
INQWIRE 1.5J .035X260CM (WIRE) ×2
KIT ESSENTIALS PG (KITS) ×1 IMPLANT
KIT HEART LEFT (KITS) ×2 IMPLANT
KIT HEMO VALVE WATCHDOG (MISCELLANEOUS) ×1 IMPLANT
PACK CARDIAC CATHETERIZATION (CUSTOM PROCEDURE TRAY) ×2 IMPLANT
TRANSDUCER W/STOPCOCK (MISCELLANEOUS) ×2 IMPLANT
TUBING CIL FLEX 10 FLL-RA (TUBING) ×2 IMPLANT

## 2020-05-10 NOTE — Interval H&P Note (Signed)
History and Physical Interval Note:  05/10/2020 1:42 PM  [name]  has presented today for surgery, with the diagnosis of decrease EF.  The various methods of treatment have been discussed with the patient and family. After consideration of risks, benefits and other options for treatment, the patient has consented to  Procedure(s): LEFT HEART CATH AND CORS/GRAFTS ANGIOGRAPHY (N/A) as a surgical intervention.  The patient's history has been reviewed, patient examined, no change in status, stable for surgery.  I have reviewed the patient's chart and labs.  Questions were answered to the patient's satisfaction.     Sherren Mocha

## 2020-05-10 NOTE — Discharge Instructions (Signed)
DRINK PLENTY OF FLUIDS FOR THE NEXT 2-3 DAYS.  KEEP ARM ELEVATED THE REMAINDER OF THE DAY.  Radial Site Care  This sheet gives you information about how to care for yourself after your procedure. Your health care provider may also give you more specific instructions. If you have problems or questions, contact your health care provider. What can I expect after the procedure? After the procedure, it is common to have:  Bruising and tenderness at the catheter insertion area. Follow these instructions at home: Medicines  Take over-the-counter and prescription medicines only as told by your health care provider. Insertion site care 1. Follow instructions from your health care provider about how to take care of your insertion site. Make sure you: ? Wash your hands with soap and water before you change your bandage (dressing). If soap and water are not available, use hand sanitizer. ? Change your dressing as told by your health care provider. 2. Check your insertion site every day for signs of infection. Check for: ? Redness, swelling, or pain. ? Fluid or blood. ? Pus or a bad smell. ? Warmth. 3. Do not take baths, swim, or use a hot tub for 5 days. 4. You may shower 24-48 hours after the procedure. ? Remove the dressing and gently wash the site with plain soap and water. ? Pat the area dry with a clean towel. ? Do not rub the site. That could cause bleeding. 5. Do not apply powder or lotion to the site. Activity  1. For 24 hours after the procedure, or as directed by your health care provider: ? Do not flex or bend the affected arm. ? Do not push or pull heavy objects with the affected arm. ? Do not drive yourself home from the hospital or clinic. You may drive 24 hours after the procedure. ? Do not operate machinery or power tools. 2. Do not push, pull or lift anything that is heavier than 10 lb for 5 days. 3. Ask your health care provider when it is okay to: ? Return to work or  school. ? Resume usual physical activities or sports. ? Resume sexual activity. General instructions  If the catheter site starts to bleed, raise your arm and put firm pressure on the site. If the bleeding does not stop, get help right away. This is a medical emergency.  If you went home on the same day as your procedure, a responsible adult should be with you for the first 24 hours after you arrive home.  Keep all follow-up visits as told by your health care provider. This is important. Contact a health care provider if:  You have a fever.  You have redness, swelling, or yellow drainage around your insertion site. Get help right away if:  You have unusual pain at the radial site.  The catheter insertion area swells very fast.  The insertion area is bleeding, and the bleeding does not stop when you hold steady pressure on the area.  Your arm or hand becomes pale, cool, tingly, or numb. These symptoms may represent a serious problem that is an emergency. Do not wait to see if the symptoms will go away. Get medical help right away. Call your local emergency services (911 in the [place] Do not drive yourself to the hospital. Summary  After the procedure, it is common to have bruising and tenderness at the site.  Follow instructions from your health care provider about how to take care of your radial site wound. Check   the wound every day for signs of infection.  Do not push, pull or lift anything that is heavier than 10 lb for 5 days.  This information is not intended to replace advice given to you by your health care provider. Make sure you discuss any questions you have with your health care provider. Document Revised: 09/17/2017 Document Reviewed: 09/17/2017 Elsevier Patient Education  2020 Elsevier Inc. 

## 2020-05-11 ENCOUNTER — Encounter (HOSPITAL_COMMUNITY): Payer: Self-pay | Admitting: Cardiovascular Disease

## 2020-05-11 MED ORDER — MIDAZOLAM HCL 2 MG/2ML IJ SOLN
INTRAMUSCULAR | Status: DC | PRN
  Administered 2020-05-10: 1 mg via INTRAVENOUS

## 2020-05-11 MED ORDER — FENTANYL CITRATE (PF) 100 MCG/2ML IJ SOLN
INTRAMUSCULAR | Status: DC | PRN
  Administered 2020-05-10: 25 ug via INTRAVENOUS

## 2020-05-12 ENCOUNTER — Encounter: Payer: Self-pay | Admitting: Internal Medicine

## 2020-05-12 ENCOUNTER — Ambulatory Visit (INDEPENDENT_AMBULATORY_CARE_PROVIDER_SITE_OTHER): Payer: Medicare Other | Admitting: Internal Medicine

## 2020-05-12 ENCOUNTER — Other Ambulatory Visit: Payer: Self-pay

## 2020-05-12 VITALS — BP 146/80 | HR 73 | Temp 98.2°F | Resp 18 | Ht 76.0 in | Wt 175.4 lb

## 2020-05-12 DIAGNOSIS — Z794 Long term (current) use of insulin: Secondary | ICD-10-CM

## 2020-05-12 DIAGNOSIS — I2510 Atherosclerotic heart disease of native coronary artery without angina pectoris: Secondary | ICD-10-CM

## 2020-05-12 DIAGNOSIS — Z23 Encounter for immunization: Secondary | ICD-10-CM | POA: Diagnosis not present

## 2020-05-12 DIAGNOSIS — I10 Essential (primary) hypertension: Secondary | ICD-10-CM | POA: Diagnosis not present

## 2020-05-12 DIAGNOSIS — E1165 Type 2 diabetes mellitus with hyperglycemia: Secondary | ICD-10-CM | POA: Diagnosis not present

## 2020-05-12 LAB — POCT GLUCOSE (DEVICE FOR HOME USE): POC Glucose: 226 mg/dl — AB (ref 70–99)

## 2020-05-12 NOTE — Progress Notes (Signed)
Pre visit review using our clinic review tool, if applicable. No additional management support is needed unless otherwise documented below in the visit note. 

## 2020-05-12 NOTE — Progress Notes (Signed)
Subjective:    Patient ID: [name], male    DOB: Aug 15, 1951, 69 y.o.   MRN: [id number]  DOS:  05/12/2020 Type of visit - description: Follow-up Since the last office visit, saw cardiology, endocrinology , had a  cardiac catheterization. Records reviewed.  BP Readings from Last 3 Encounters:  05/12/20 (!) 146/80  05/10/20 (!) 148/78  05/05/20 132/90     Review of Systems Doing well, no chest pain, difficulty breathing or edema.    Past Medical History:  Diagnosis Date  . Abnormal myocardial perfusion study 2006   EF 44% ? inferoseptal ischemia. no cath done  . Benign neoplasm of cecum   . Breast mass, right 03/2008  . Cardiomyopathy ([place]) 07/20/2018   Ejection fraction 4045% in September 2019  . Centrilobular emphysema ([place]) 08/25/2018   02/04/2018-high-res CT- no evidence of interstitial lung disease, severe centrilobular emphysema and mild diffuse bronchial wall thickening suggesting COPD, no calcified pleural plaques no pleural effusion, mild smooth bilateral pleural thickening which is nonspecific  02/04/2018-pulmonary function test- FVC 3.37 (71% predicted), postbronchodilator ratio 50, postbronchodilator FEV1 54, mid flow reve  . COLONIC POLYPS, HX OF 03/18/2007   Qualifier: Diagnosis of  By: [name]   . Contact dermatitis 01/31/2013  . COPD (chronic obstructive pulmonary disease) ([place])    "dx'd 12/2017"  . COPD GOLD II A  08/25/2018   02/04/2018-high-res CT- no evidence of interstitial lung disease, severe centrilobular emphysema and mild diffuse bronchial wall thickening suggesting COPD, no calcified pleural plaques no pleural effusion, mild smooth bilateral pleural thickening which is nonspecific  02/04/2018-pulmonary function test- FVC 3.37 (71% predicted), postbronchodilator ratio 50, postbronchodilator FEV1 54, mid flow reve  . Coronary artery disease   . Critical aortic valve stenosis 01/12/2018   Calculated valve area less than 0.5 cm mean gradient  more than 60 mm of mercury  . DEGENERATIVE JOINT DISEASE, BACK 03/18/2007   Qualifier: Diagnosis of  By: [name]   . Depressed left ventricular ejection fraction 03/28/2020  . Dyspnea on exertion 01/12/2018  . ERECTILE DYSFUNCTION, ORGANIC 02/10/2009   Qualifier: Diagnosis of  By: [name]   . Heart murmur   . HEMORRHOIDS, INTERNAL 03/18/2007   Qualifier: Diagnosis of  By: [name]   . Hyperlipemia 10/21/2007   Qualifier: Diagnosis of  By: [name]   . Hypertension   . Hypoglycemia due to insulin 03/26/2020  . Low back pain   . MYOCARDIAL PERFUSION SCAN, WITH STRESS TEST, ABNORMAL 08/11/2008   Qualifier: Diagnosis of  By: Johnsie Cancel, MD, Rona Ravens   . Nocturnal hypoxemia 08/25/2018   04/09/18 >>> ONO - telephone note>>> Patient's ONO on room air showed that he was less than 88% for more than an hour. Patient needs to be on 2 L at HS.  10/06/2018-overnight oximetry-entire duration was 4 hours and 23 minutes, SPO2 less than 88% for 25 minutes and 32 seconds    . Oxygen deficiency    uses 2L at night  . Paroxysmal atrial fibrillation ([place]) 10/12/2018  . S/P AVR 03/04/2018   AVR 23 mm Edwards Magna Valve - bioprostetic 03/04/2018 LIMA - LAD  . Status post coronary artery bypass graft 03/20/2018   LIMA to LAD during aortic valve replacement surgery 03/04/2018  . Type 2 diabetes mellitus, uncontrolled, with neuropathy ([place]) 05/20/2008   Qualifier: Diagnosis of  By: [name]     Past Surgical History:  Procedure  Laterality Date  . ABDOMINAL AORTOGRAM N/A 01/13/2018   Procedure: ABDOMINAL AORTOGRAM;  Surgeon: Troy Sine, MD;  Location: [facility];  Service: Cardiovascular;  Laterality: N/A;  . AORTIC VALVE REPLACEMENT N/A 03/04/2018   Procedure: AORTIC VALVE REPLACEMENT (AVR) 84mm Edwards Magna Ease Tissue Valve.;  Surgeon: Melrose Nakayama, MD;  Location: [facility];  Service: Open Heart Surgery;  Laterality: N/A;  . CARDIAC VALVE REPLACEMENT     . COLONOSCOPY W/ POLYPECTOMY    . COLONOSCOPY WITH PROPOFOL N/A 02/22/2020   Procedure: COLONOSCOPY WITH PROPOFOL;  Surgeon: Doran Stabler, MD;  Location: [facility];  Service: Gastroenterology;  Laterality: N/A;  . CORONARY ARTERY BYPASS GRAFT N/A 03/04/2018   Procedure: CORONARY ARTERY BYPASS GRAFTING (CABG) x1:  LIMA to LAD.;  Surgeon: Melrose Nakayama, MD;  Location: [facility];  Service: Open Heart Surgery;  Laterality: N/A;  . HEMOSTASIS CLIP PLACEMENT  02/22/2020   Procedure: HEMOSTASIS CLIP PLACEMENT;  Surgeon: Doran Stabler, MD;  Location: [facility];  Service: Gastroenterology;;  . Cruzita Lederer PRESSURE WIRE/FFR STUDY N/A 05/10/2020   Procedure: INTRAVASCULAR PRESSURE WIRE/FFR STUDY;  Surgeon: Sherren Mocha, MD;  Location: [facility];  Service: Cardiovascular;  Laterality: N/A;  . LEFT HEART CATH AND CORS/GRAFTS ANGIOGRAPHY N/A 05/10/2020   Procedure: LEFT HEART CATH AND CORS/GRAFTS ANGIOGRAPHY;  Surgeon: Sherren Mocha, MD;  Location: [facility];  Service: Cardiovascular;  Laterality: N/A;  . LIPOMA EXCISION Left 07/2008    "back of shoulder" Dr Gershon Crane  . POLYPECTOMY  02/22/2020   Procedure: POLYPECTOMY;  Surgeon: Doran Stabler, MD;  Location: [facility];  Service: Gastroenterology;;  . RIGHT/LEFT HEART CATH AND CORONARY ANGIOGRAPHY N/A 01/13/2018   Procedure: RIGHT/LEFT HEART CATH AND CORONARY ANGIOGRAPHY;  Surgeon: Troy Sine, MD;  Location: [facility];  Service: Cardiovascular;  Laterality: N/A;  . TEE WITHOUT CARDIOVERSION N/A 03/04/2018   Procedure: TRANSESOPHAGEAL ECHOCARDIOGRAM (TEE);  Surgeon: Melrose Nakayama, MD;  Location: [facility];  Service: Open Heart Surgery;  Laterality: N/A;  . TONSILLECTOMY    . WISDOM TOOTH EXTRACTION      Allergies as of 05/12/2020      Reactions   Amlodipine Besylate Other (See Comments)   REACTION: ? caused left axillary nodules/chest flutter   Hydrochlorothiazide Hives   Lisinopril     Hyperkalemia      Medication List       Accurate as of May 12, 2020 11:59 PM. If you have any questions, ask your nurse or doctor.        acetaminophen 500 MG tablet Commonly known as: TYLENOL Take 1,000 mg by mouth every 6 (six) hours as needed for moderate pain or headache.   aspirin 325 MG EC tablet Take 1 tablet (325 mg total) by mouth daily.   cetirizine 10 MG tablet Commonly known as: ZYRTEC Take 10 mg by mouth daily as needed for allergies.   glucose blood test strip Commonly known as: Prodigy No Coding Blood Gluc Use as instructed to check blood sugar twice a day.  DX  E11.40   hydrALAZINE 10 MG tablet Commonly known as: APRESOLINE TAKE 1 TABLET 3 TIMES A DAY   Insulin Pen Needle 32G X 4 MM Misc 1 Device by Does not apply route as directed.   Levemir FlexTouch 100 UNIT/ML FlexPen Generic drug: insulin detemir Inject 20 Units into the skin daily. Inject 25 units under the skin once daily. What changed: additional instructions  losartan 25 MG tablet Commonly known as: Cozaar Take 1 tablet (25 mg total) by mouth daily.   metFORMIN 1000 MG tablet Commonly known as: GLUCOPHAGE Take 500 mg by mouth daily.   metoprolol succinate 50 MG 24 hr tablet Commonly known as: TOPROL-XL Take 50 mg by mouth daily. Take with or immediately following a meal.   Metoprolol Succinate 25 MG Cs24 Take 25 mg by mouth daily.   NovoLOG FlexPen 100 UNIT/ML FlexPen Generic drug: insulin aspart Inject 8 Units into the skin 3 (three) times daily with meals. Inject 10 units under the skin once daily before dinner. What changed: additional instructions   rosuvastatin 20 MG tablet Commonly known as: CRESTOR Take 1 tablet (20 mg total) by mouth daily.   spironolactone 25 MG tablet Commonly known as: Aldactone Take 0.5 tablets (12.5 mg total) by mouth daily.   VITAMIN B-12 PO Take 1 tablet by mouth daily.          Objective:   Physical Exam BP (!) 146/80 (BP  Location: Right Arm)   Pulse 73   Temp 98.2 F (36.8 C) (Oral)   Resp 18   Ht 6\' 4"  (1.93 m)   Wt 175 lb 6 oz (79.5 kg)   SpO2 96%   BMI 21.35 kg/m  General:   Well developed, NAD, BMI noted. HEENT:  Normocephalic . Face symmetric, atraumatic Lungs:  CTA B Normal respiratory effort, no intercostal retractions, no accessory muscle use. Heart: RRR,  no murmur.  Lower extremities: no pretibial edema bilaterally  Skin: Not pale. Not jaundice Neurologic:  alert & oriented X3.  Speech normal, gait appropriate for age and unassisted Psych--  Cognition and judgment appear intact.  Cooperative with normal attention span and concentration.  Behavior appropriate. No anxious or depressed appearing.      Assessment    Assessment (new patient 03/2020) DM HTN CAD, CABG and aortic valve replacement due to stenosis on 02/2018, cardiomyopathy COPD  PLAN DM: Saw Endo 04/19/2020, at that time insulin dose decreased. Currently on Levemir 20 units, NPH 8 units with meals, Metformin 500 mg daily. Holding insulin Metformin d/t recent cath, to restart tomorrow. CBGs morning 180, CBG at the time of this visit 226 HTN: BP 160/95 when arrived, recheck 146/80. Currently on metoprolol XL 50, hydralazine,Losartan 25 mg, Aldactone 25 mg half tablet.  Recommend close monitor BPs, let me know if not at goal CAD: Saw cardiology 05/05/2020, due to high risk for progression of CAD and recent decrease in EF, a cath was done 05/10/2020, Rx medical therapy. Thrombocytosis: Platelets increased in the last few months, recheck on RTC, consider hematology referral Preventive care reviewed. RTC 3 months   This visit occurred during the SARS-CoV-2 public health emergency.  Safety protocols were in place, including screening questions prior to the visit, additional usage of staff PPE, and extensive cleaning of exam room while observing appropriate contact time as indicated for disinfecting solutions.

## 2020-05-12 NOTE — Patient Instructions (Signed)
Check the  blood pressure 2 or 3 times a week BP GOAL is between 110/65 and  135/85. If it is consistently higher or lower, let me know  Restart your insulin and Metformin tomorrow morning   GO TO THE FRONT DESK, [place] back for a checkup in 4 months

## 2020-05-14 DIAGNOSIS — Z09 Encounter for follow-up examination after completed treatment for conditions other than malignant neoplasm: Secondary | ICD-10-CM | POA: Insufficient documentation

## 2020-05-14 HISTORY — DX: Encounter for follow-up examination after completed treatment for conditions other than malignant neoplasm: Z09

## 2020-05-14 NOTE — Assessment & Plan Note (Addendum)
DM: Saw Endo 04/19/2020, at that time insulin dose decreased. Currently on Levemir 20 units, NPH 8 units with meals, Metformin 500 mg daily. Holding insulin Metformin d/t recent cath, to restart tomorrow. CBGs morning 180, CBG at the time of this visit 226 HTN: BP 160/95 when arrived, recheck 146/80. Currently on metoprolol XL 50, hydralazine,Losartan 25 mg, Aldactone 25 mg half tablet.  Recommend close monitor BPs, let me know if not at goal CAD: Saw cardiology 05/05/2020, due to high risk for progression of CAD and recent decrease in EF, a cath was done 05/10/2020, Rx medical therapy. Thrombocytosis: Platelets increased in the last few months, recheck on RTC, consider hematology referral Preventive care reviewed. RTC 3 months

## 2020-05-14 NOTE — Assessment & Plan Note (Signed)
Preventive care reviewed Td 2010  PNM 13: 2018.  PNM 23 2009, 2019 COVID-19 shot March and April. Flu shot today CCS: Had a colonoscopy  01/2020 PSA normal 08-2018

## 2020-05-23 ENCOUNTER — Inpatient Hospital Stay: Admission: RE | Admit: 2020-05-23 | Payer: Medicare Other | Source: Ambulatory Visit

## 2020-05-26 ENCOUNTER — Other Ambulatory Visit: Payer: Self-pay | Admitting: Internal Medicine

## 2020-06-06 ENCOUNTER — Inpatient Hospital Stay: Admission: RE | Admit: 2020-06-06 | Payer: Medicare Other | Source: Ambulatory Visit

## 2020-06-06 ENCOUNTER — Ambulatory Visit
Admission: RE | Admit: 2020-06-06 | Discharge: 2020-06-06 | Disposition: A | Discharge status: Home / Self Care | Payer: Medicare Other | Source: Ambulatory Visit | Attending: Acute Care | Admitting: Acute Care

## 2020-06-06 ENCOUNTER — Other Ambulatory Visit: Payer: Self-pay

## 2020-06-06 DIAGNOSIS — I2510 Atherosclerotic heart disease of native coronary artery without angina pectoris: Secondary | ICD-10-CM | POA: Diagnosis not present

## 2020-06-06 DIAGNOSIS — Z87891 Personal history of nicotine dependence: Secondary | ICD-10-CM | POA: Diagnosis not present

## 2020-06-06 DIAGNOSIS — I700 Atherosclerosis of aorta: Secondary | ICD-10-CM | POA: Diagnosis not present

## 2020-06-06 DIAGNOSIS — J432 Centrilobular emphysema: Secondary | ICD-10-CM | POA: Diagnosis not present

## 2020-06-06 DIAGNOSIS — Z122 Encounter for screening for malignant neoplasm of respiratory organs: Secondary | ICD-10-CM

## 2020-06-07 ENCOUNTER — Encounter: Payer: Self-pay | Admitting: Cardiology

## 2020-06-07 ENCOUNTER — Ambulatory Visit (INDEPENDENT_AMBULATORY_CARE_PROVIDER_SITE_OTHER): Payer: Medicare Other | Admitting: Cardiology

## 2020-06-07 VITALS — BP 134/88 | HR 80 | Ht 76.0 in | Wt 177.0 lb

## 2020-06-07 DIAGNOSIS — E1165 Type 2 diabetes mellitus with hyperglycemia: Secondary | ICD-10-CM

## 2020-06-07 DIAGNOSIS — E785 Hyperlipidemia, unspecified: Secondary | ICD-10-CM

## 2020-06-07 DIAGNOSIS — Z794 Long term (current) use of insulin: Secondary | ICD-10-CM

## 2020-06-07 DIAGNOSIS — I255 Ischemic cardiomyopathy: Secondary | ICD-10-CM | POA: Diagnosis not present

## 2020-06-07 DIAGNOSIS — I2510 Atherosclerotic heart disease of native coronary artery without angina pectoris: Secondary | ICD-10-CM

## 2020-06-07 DIAGNOSIS — Z952 Presence of prosthetic heart valve: Secondary | ICD-10-CM | POA: Diagnosis not present

## 2020-06-07 DIAGNOSIS — I10 Essential (primary) hypertension: Secondary | ICD-10-CM

## 2020-06-07 DIAGNOSIS — R0989 Other specified symptoms and signs involving the circulatory and respiratory systems: Secondary | ICD-10-CM | POA: Diagnosis not present

## 2020-06-07 MED ORDER — ENTRESTO 24-26 MG PO TABS: 1.0000 | ORAL_TABLET | Freq: Two times a day (BID) | ORAL | 3 refills | Status: DC

## 2020-06-07 NOTE — Progress Notes (Signed)
Cardiology Office Note:    Date:  06/07/2020   ID:  [name], DOB [birth year], MRN [id number]  PCP:  Colon Branch, MD  Cardiologist:  Berniece Salines, DO  Electrophysiologist:  None   Referring MD: Colon Branch, MD   Chief Complaint  Patient presents with  . Follow-up    History of Present Illness:    [name] Corneliusis a 69 y.o.malewith a hx of coronary artery disease status post CABG in 2019, ischemic cardiomyopathy most recent EF on March 27, 2020 at which time his EF was reported to be 25 to 30%,Status post aortic valve replacement with a 23 Edwards valve also in July 2019, hypertension hyperlipidemia and diabetes.  I saw the patient on April 05, 2020 at that time we kept him on his losartan as well as his beta-blocker and added Aldactone.   When I saw the patient 1 May 05, 2020 he still with experiencing significant fatigue and shortness of breath.  Given his history of  CAD I recommended he undergo a left heart catheterization to make sure that worsening/progression of CAD was not causing symptoms.   In the interim he was able to get this procedure done which show moderate CAD with no indication for PCI.  He is here today for follow-up visit.  Shortness of breath has improved some.  No chest pain   Past Medical History:  Diagnosis Date  . Abnormal myocardial perfusion study 2006   EF 44% ? inferoseptal ischemia. no cath done  . Benign neoplasm of cecum   . Breast mass, right 03/2008  . Cardiomyopathy ([place]) 07/20/2018   Ejection fraction 4045% in September 2019  . Centrilobular emphysema ([place]) 08/25/2018   02/04/2018-high-res CT- no evidence of interstitial lung disease, severe centrilobular emphysema and mild diffuse bronchial wall thickening suggesting COPD, no calcified pleural plaques no pleural effusion, mild smooth bilateral pleural thickening which is nonspecific  02/04/2018-pulmonary function test- FVC 3.37 (71% predicted), postbronchodilator ratio  50, postbronchodilator FEV1 54, mid flow reve  . COLONIC POLYPS, HX OF 03/18/2007   Qualifier: Diagnosis of  By: [name]   . Contact dermatitis 01/31/2013  . COPD (chronic obstructive pulmonary disease) ([place])    "dx'd 12/2017"  . COPD GOLD II A  08/25/2018   02/04/2018-high-res CT- no evidence of interstitial lung disease, severe centrilobular emphysema and mild diffuse bronchial wall thickening suggesting COPD, no calcified pleural plaques no pleural effusion, mild smooth bilateral pleural thickening which is nonspecific  02/04/2018-pulmonary function test- FVC 3.37 (71% predicted), postbronchodilator ratio 50, postbronchodilator FEV1 54, mid flow reve  . Coronary artery disease   . Critical aortic valve stenosis 01/12/2018   Calculated valve area less than 0.5 cm mean gradient more than 60 mm of mercury  . DEGENERATIVE JOINT DISEASE, BACK 03/18/2007   Qualifier: Diagnosis of  By: [name]   . Depressed left ventricular ejection fraction 03/28/2020  . Dyspnea on exertion 01/12/2018  . ERECTILE DYSFUNCTION, ORGANIC 02/10/2009   Qualifier: Diagnosis of  By: [name]   . Heart murmur   . HEMORRHOIDS, INTERNAL 03/18/2007   Qualifier: Diagnosis of  By: [name]   . Hyperlipemia 10/21/2007   Qualifier: Diagnosis of  By: [name]   . Hypertension   . Hypoglycemia due to insulin 03/26/2020  . Low back pain   . MYOCARDIAL PERFUSION SCAN, WITH STRESS TEST, ABNORMAL 08/11/2008   Qualifier: Diagnosis of  By: Johnsie Cancel, MD, [facility],  Rolene Course   . Nocturnal hypoxemia 08/25/2018   04/09/18 >>> ONO - telephone note>>> Patient's ONO on room air showed that he was less than 88% for more than an hour. Patient needs to be on 2 L at HS.  10/06/2018-overnight oximetry-entire duration was 4 hours and 23 minutes, SPO2 less than 88% for 25 minutes and 32 seconds    . Oxygen deficiency    uses 2L at night  . Paroxysmal atrial fibrillation ([place]) 10/12/2018  . S/P AVR 03/04/2018   AVR  23 mm Edwards Magna Valve - bioprostetic 03/04/2018 LIMA - LAD  . Status post coronary artery bypass graft 03/20/2018   LIMA to LAD during aortic valve replacement surgery 03/04/2018  . Type 2 diabetes mellitus, uncontrolled, with neuropathy ([place]) 05/20/2008   Qualifier: Diagnosis of  By: [name]     Past Surgical History:  Procedure Laterality Date  . ABDOMINAL AORTOGRAM N/A 01/13/2018   Procedure: ABDOMINAL AORTOGRAM;  Surgeon: Troy Sine, MD;  Location: [place] [facility];  Service: Cardiovascular;  Laterality: N/A;  . AORTIC VALVE REPLACEMENT N/A 03/04/2018   Procedure: AORTIC VALVE REPLACEMENT (AVR) 56mm Edwards Magna Ease Tissue Valve.;  Surgeon: Melrose Nakayama, MD;  Location: [place];  Service: Open Heart Surgery;  Laterality: N/A;  . CARDIAC VALVE REPLACEMENT    . COLONOSCOPY W/ POLYPECTOMY    . COLONOSCOPY WITH PROPOFOL N/A 02/22/2020   Procedure: COLONOSCOPY WITH PROPOFOL;  Surgeon: Doran Stabler, MD;  Location: [facility];  Service: Gastroenterology;  Laterality: N/A;  . CORONARY ARTERY BYPASS GRAFT N/A 03/04/2018   Procedure: CORONARY ARTERY BYPASS GRAFTING (CABG) x1:  LIMA to LAD.;  Surgeon: Melrose Nakayama, MD;  Location: [place];  Service: Open Heart Surgery;  Laterality: N/A;  . HEMOSTASIS CLIP PLACEMENT  02/22/2020   Procedure: HEMOSTASIS CLIP PLACEMENT;  Surgeon: Doran Stabler, MD;  Location: [facility];  Service: Gastroenterology;;  . Cruzita Lederer PRESSURE WIRE/FFR STUDY N/A 05/10/2020   Procedure: INTRAVASCULAR PRESSURE WIRE/FFR STUDY;  Surgeon: Sherren Mocha, MD;  Location: [place] [facility];  Service: Cardiovascular;  Laterality: N/A;  . LEFT HEART CATH AND CORS/GRAFTS ANGIOGRAPHY N/A 05/10/2020   Procedure: LEFT HEART CATH AND CORS/GRAFTS ANGIOGRAPHY;  Surgeon: Sherren Mocha, MD;  Location: [place] [facility];  Service: Cardiovascular;  Laterality: N/A;  . LIPOMA EXCISION Left 07/2008    "back of shoulder" Dr Gershon Crane  . POLYPECTOMY   02/22/2020   Procedure: POLYPECTOMY;  Surgeon: Doran Stabler, MD;  Location: [facility];  Service: Gastroenterology;;  . RIGHT/LEFT HEART CATH AND CORONARY ANGIOGRAPHY N/A 01/13/2018   Procedure: RIGHT/LEFT HEART CATH AND CORONARY ANGIOGRAPHY;  Surgeon: Troy Sine, MD;  Location: [place] [facility];  Service: Cardiovascular;  Laterality: N/A;  . TEE WITHOUT CARDIOVERSION N/A 03/04/2018   Procedure: TRANSESOPHAGEAL ECHOCARDIOGRAM (TEE);  Surgeon: Melrose Nakayama, MD;  Location: [place];  Service: Open Heart Surgery;  Laterality: N/A;  . TONSILLECTOMY    . WISDOM TOOTH EXTRACTION      Current Medications: Current Meds  Medication Sig  . acetaminophen (TYLENOL) 500 MG tablet Take 1,000 mg by mouth every 6 (six) hours as needed for moderate pain or headache.  Marland Kitchen aspirin EC 325 MG EC tablet Take 1 tablet (325 mg total) by mouth daily.  . cetirizine (ZYRTEC) 10 MG tablet Take 10 mg by mouth daily as needed for allergies.   . Cyanocobalamin (VITAMIN B-12 PO) Take 1 tablet by mouth daily.  Marland Kitchen  glucose blood (PRODIGY NO CODING BLOOD GLUC) test strip Use as instructed to check blood sugar twice a day.  DX  E11.40  . hydrALAZINE (APRESOLINE) 10 MG tablet Take 1 tablet (10 mg total) by mouth 3 (three) times daily.  . insulin aspart (NOVOLOG FLEXPEN) 100 UNIT/ML FlexPen Inject 8 Units into the skin 3 (three) times daily with meals. Inject 10 units under the skin once daily before dinner. (Patient taking differently: Inject 8 Units into the skin 3 (three) times daily with meals. )  . insulin detemir (LEVEMIR FLEXTOUCH) 100 UNIT/ML FlexPen Inject 20 Units into the skin daily. Inject 25 units under the skin once daily. (Patient taking differently: Inject 20 Units into the skin daily. )  . Insulin Pen Needle 32G X 4 MM MISC 1 Device by Does not apply route as directed.  . metFORMIN (GLUCOPHAGE) 1000 MG tablet Take 500 mg by mouth daily.   . metoprolol succinate (TOPROL-XL) 50 MG 24 hr tablet Take 50  mg by mouth daily. Take with or immediately following a meal.   . rosuvastatin (CRESTOR) 20 MG tablet Take 1 tablet (20 mg total) by mouth daily.  Marland Kitchen spironolactone (ALDACTONE) 25 MG tablet Take 0.5 tablets (12.5 mg total) by mouth daily.  . [DISCONTINUED] losartan (COZAAR) 25 MG tablet Take 1 tablet (25 mg total) by mouth daily.     Allergies:   Amlodipine besylate, Hydrochlorothiazide, and Lisinopril   Social History   Socioeconomic History  . Marital status: Married    Spouse name: Not on file  . Number of children: Not on file  . Years of education: Not on file  . Highest education level: Not on file  Occupational History  . Not on file  Tobacco Use  . Smoking status: Former Smoker    Packs/day: 1.50    Years: 41.00    Pack years: 61.50    Types: Cigarettes    Quit date: 08/26/2010    Years since quitting: 9.7  . Smokeless tobacco: Never Used  . Tobacco comment: Quit 2012  Vaping Use  . Vaping Use: Never used  Substance and Sexual Activity  . Alcohol use: Yes    Comment: occasionally  . Drug use: Not Currently  . Sexual activity: Not Currently  Other Topics Concern  . 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 Difficulty of Paying Living Expenses: Not hard at all  Food Insecurity: No Food Insecurity  . Worried About Charity fundraiser in the Last Year: Never true  . Ran Out of Food in the Last Year: Never true  Transportation Needs: No Transportation Needs  . Lack of Transportation (Medical): No  . Lack of Transportation (Non-Medical): No  Physical Activity:   . Days of Exercise per Week: Not on file  . Minutes of Exercise per Session: Not on file  Stress:   . Feeling of Stress : Not on file  Social Connections:   . Frequency of Communication with Friends  and Family: Not on file  . Frequency of Social Gatherings with Friends and Family: Not on file  . Attends Religious Services: Not on file  . Active Member of Clubs or Organizations: Not  on file  . Attends Archivist Meetings: Not on file  . Marital Status: Not on file     Family History: The patient's family history includes Alzheimer's disease in his father; Diabetes in his father; Hypertension in his mother; Melanoma (age of onset: 44) in his father. There is no history of Colon cancer, Esophageal cancer, Colon polyps, or Stomach cancer.  ROS:   Review of Systems  Constitution: Negative for decreased appetite, fever and weight gain.  HENT: Negative for congestion, ear discharge, hoarse voice and sore throat.   Eyes: Negative for discharge, redness, vision loss in right eye and visual halos.  Cardiovascular: Negative for chest pain, dyspnea on exertion, leg swelling, orthopnea and palpitations.  Respiratory: Negative for cough, hemoptysis, shortness of breath and snoring.   Endocrine: Negative for heat intolerance and polyphagia.  Hematologic/Lymphatic: Negative for bleeding problem. Does not bruise/bleed easily.  Skin: Negative for flushing, nail changes, rash and suspicious lesions.  Musculoskeletal: Negative for arthritis, joint pain, muscle cramps, myalgias, neck pain and stiffness.  Gastrointestinal: Negative for abdominal pain, bowel incontinence, diarrhea and excessive appetite.  Genitourinary: Negative for decreased libido, genital sores and incomplete emptying.  Neurological: Negative for brief paralysis, focal weakness, headaches and loss of balance.  Psychiatric/Behavioral: Negative for altered mental status, depression and suicidal ideas.  Allergic/Immunologic: Negative for HIV exposure and persistent infections.    EKGs/Labs/Other Studies Reviewed:    The following studies were reviewed today:   EKG:  The ekg ordered today demonstrates   Recent  Labs: 03/26/2020: ALT 13; TSH 0.574 05/05/2020: BUN 11; Creatinine, Ser 0.82; Hemoglobin 15.9; Magnesium 1.7; Platelets 525; Potassium 4.9; Sodium 140  Recent Lipid Panel    Component Value Date/Time   CHOL 127 03/23/2020 1129   TRIG 66.0 03/23/2020 1129   HDL 50.00 03/23/2020 1129   CHOLHDL 3 03/23/2020 1129   VLDL 13.2 03/23/2020 1129   LDLCALC 64 03/23/2020 1129   LDLDIRECT 177.6 10/07/2007 1001    Physical Exam:    VS:  BP (!) 150/100 (BP Location: Right Arm, Patient Position: Sitting, Cuff Size: Normal)   Pulse 80   Ht 6\' 4"  (1.93 m)   Wt 177 lb (80.3 kg)   SpO2 95%   BMI 21.55 kg/m     Wt Readings from Last 3 Encounters:  06/07/20 177 lb (80.3 kg)  05/12/20 175 lb 6 oz (79.5 kg)  05/05/20 174 lb (78.9 kg)     GEN: Well nourished, well developed in no acute distress HEENT: Normal NECK: No JVD; No carotid bruits LYMPHATICS: No lymphadenopathy CARDIAC: S1S2 noted,RRR, no murmurs, rubs, gallops RESPIRATORY:  Clear to auscultation without rales, wheezing or rhonchi  ABDOMEN: Soft, non-tender, non-distended, +bowel sounds, no guarding. EXTREMITIES: No edema, No cyanosis, no clubbing MUSCULOSKELETAL:  No deformity  SKIN: Warm and dry NEUROLOGIC:  Alert and oriented x 3, non-focal PSYCHIATRIC:  Normal affect, good insight  ASSESSMENT:    1. Ischemic cardiomyopathy   2. Primary hypertension   3. Coronary artery disease involving native coronary artery of native heart without angina pectoris   4. Type 2 diabetes mellitus with hyperglycemia, with long-term current use of insulin (HCC)   5. H/O aortic valve replacement   6. Dyslipidemia   7. Depressed left ventricular ejection fraction    PLAN:     The patient has tolerated the losartan very well and at this time would like to transition him to Entresto 24/26 mg twice daily.  I have educated the patient and his wife  about this new medication and all its potential side effects.  Continue his Toprol-XL as well as  Aldactone.  He will get blood work today to assess his kidney function.  His blood pressure which was manually performed by me during this visit was 134/88 mmHg. Diabetes mellitus is being managed by his endocrinologist. Continue patient on his aspirin and statin for coronary artery disease.  Blood work will be done today for BMP, CBC and mag  The patient is in agreement with the above plan. The patient left the office in stable condition.  The patient will follow up in 2 months or sooner if needed.    Medication Adjustments/Labs and Tests Ordered: Current medicines are reviewed at length with the patient today.  Concerns regarding medicines are outlined above.  Orders Placed This Encounter  Procedures  . Basic metabolic panel  . Magnesium  . CBC   Meds ordered this encounter  Medications  . sacubitril-valsartan (ENTRESTO) 24-26 MG    Sig: Take 1 tablet by mouth 2 (two) times daily. ENTRESTO FREE MONTH CARD RXBIN [id number] RXGRP [id number] RXPCN [facility]  ID [id number]    Dispense:  60 tablet    Refill:  3    Patient Instructions  Medication Instructions:  Your physician has recommended you make the following change in your medication:   START: Entresto 24/26 mg twice daily   STOP: Losartan   *If you need a refill on your cardiac medications before your next appointment, please call your pharmacy*   Lab Work: Your physician recommends that you return for lab work today: bmp, mg, cbc   If you have labs (blood work) drawn today and your tests are completely normal, you will receive your results only by: Marland Kitchen MyChart Message (if you have MyChart) OR . A paper copy in the mail If you have any lab test that is abnormal or we need to change your treatment, we will call you to review the results.   Testing/Procedures: None.    Follow-Up: At [facility], you and your health needs are our priority.  As part of our continuing mission to provide you with exceptional heart care,  we have created designated Provider Care Teams.  These Care Teams include your primary Cardiologist (physician) and Advanced Practice Providers (APPs -  Physician Assistants and Nurse Practitioners) who all work together to provide you with the care you need, when you need it.  We recommend signing up for the patient portal called "MyChart".  Sign up information is provided on this After Visit Summary.  MyChart is used to connect with patients for Virtual Visits (Telemedicine).  Patients are able to view lab/test results, encounter notes, upcoming appointments, etc.  Non-urgent messages can be sent to your provider as well.   To learn more about what you can do with MyChart, go to [email or web address].    Your next appointment:   2 month(s)  The format for your next appointment:   In Person  Provider:   Berniece Salines, DO   Other Instructions  Sacubitril; Valsartan Oral Tablets What is this medicine? SACUBITRIL; VALSARTAN (sak UE bi tril; val SAR tan) is a combination of a neprilysin inhibitor and a an angiotensin II receptor blocker. It treats heart failure. This medicine may be used for other purposes; ask your health care provider or pharmacist if you have questions. COMMON BRAND NAME(S): Entresto What should I tell my health care provider before I take this medicine? They need to know if you have any of  these conditions:  diabetes and take a medicine that contains aliskiren  kidney disease  liver disease  an unusual or allergic reaction to sacubitril; valsartan, drugs called angiotensin converting enzyme (ACE) inhibitors, angiotensin II receptor blockers (ARBs), other medicines, foods, dyes, or preservatives  pregnant or trying to get pregnant  breast-feeding How should I use this medicine? Take this drug by mouth. Take it as directed on the prescription label at the same time every day. You can take it with or without food. If it upsets your stomach, take it with food. Keep  taking it unless your health care provider tells you to stop. Talk to your health care provider about the use of this drug in children. While it may be prescribed for children as young as 1 for selected conditions, precautions do apply. Overdosage: If you think you have taken too much of this medicine contact a poison control center or emergency room at once. NOTE: This medicine is only for you. Do not share this medicine with others. What if I miss a dose? If you miss a dose, take it as soon as you can. If it is almost time for your next dose, take only that dose. Do not take double or extra doses. What may interact with this medicine? Do not take this medicine with any of the following medicines:  aliskiren if you have diabetes  angiotensin-converting enzyme (ACE) inhibitors, like benazepril, captopril, enalapril, fosinopril, lisinopril, or ramipril This medicine may also interact with the following medicines:  angiotensin II receptor blockers (ARBs) like azilsartan, candesartan, eprosartan, irbesartan, losartan, olmesartan, telmisartan, or valsartan  lithium  NSAIDS, medicines for pain and inflammation, like ibuprofen or naproxen  potassium-sparing diuretics like amiloride, spironolactone, and triamterene  potassium supplements This list may not describe all possible interactions. Give your health care provider a list of all the medicines, herbs, non-prescription drugs, or dietary supplements you use. Also tell them if you smoke, drink alcohol, or use illegal drugs. Some items may interact with your medicine. What should I watch for while using this medicine? Tell your doctor or healthcare professional if your symptoms do not start to get better or if they get worse. Do not become pregnant while taking this medicine. Women should inform their doctor if they wish to become pregnant or think they might be pregnant. There is a potential for serious side effects to an unborn child. Talk to  your health care professional or pharmacist for more information. You may get dizzy. Do not drive, use machinery, or do anything that needs mental alertness until you know how this medicine affects you. Do not stand or sit up quickly, especially if you are an older patient. This reduces the risk of dizzy or fainting spells. Avoid alcoholic drinks; they can make you more dizzy. What side effects may I notice from receiving this medicine? Side effects that you should report to your doctor or health care professional as soon as possible:  allergic reactions like skin rash, itching or hives, swelling of the face, lips, or tongue  signs and symptoms of increased potassium like muscle weakness; chest pain; or fast, irregular heartbeat  signs and symptoms of kidney injury like trouble passing urine or change in the amount of urine  signs and symptoms of low blood pressure like feeling dizzy or lightheaded, or if you develop extreme fatigue Side effects that usually do not require medical attention (report to your doctor or health care professional if they continue or are bothersome):  cough This  list may not describe all possible side effects. Call your doctor for medical advice about side effects. You may report side effects to FDA at 1-800-FDA-1088. Where should I keep my medicine? Keep out of the reach of children and pets. Store at room temperature between 20 and 25 degrees C (68 and 77 degrees F). Protect from moisture. Keep the container tightly closed. Throw away any unused drug after the expiration date. NOTE: This sheet is a summary. It may not cover all possible information. If you have questions about this medicine, talk to your doctor, pharmacist, or health care provider.  2020 Elsevier/Gold Standard (2019-03-17 16:03:07)      Adopting a Healthy Lifestyle.  Know what a healthy weight is for you (roughly BMI <25) and aim to maintain this   Aim for 7+ servings of fruits and  vegetables daily   65-80+ fluid ounces of water or unsweet tea for healthy kidneys   Limit to max 1 drink of alcohol per day; avoid smoking/tobacco   Limit animal fats in diet for cholesterol and heart health - choose grass fed whenever available   Avoid highly processed foods, and foods high in saturated/trans fats   Aim for low stress - take time to unwind and care for your mental health   Aim for 150 min of moderate intensity exercise weekly for heart health, and weights twice weekly for bone health   Aim for 7-9 hours of sleep daily   When it comes to diets, agreement about the perfect plan isnt easy to find, even among the experts. Experts at the [employer] developed an idea known as the Healthy Eating Plate. Just imagine a plate divided into logical, healthy portions.   The emphasis is on diet quality:   Load up on vegetables and fruits - one-half of your plate: Aim for color and variety, and remember that potatoes dont count.   Go for whole grains - one-quarter of your plate: Whole wheat, barley, wheat berries, quinoa, oats, brown rice, and foods made with them. If you want pasta, go with whole wheat pasta.   Protein power - one-quarter of your plate: Fish, chicken, beans, and nuts are all healthy, versatile protein sources. Limit red meat.   The diet, however, does go beyond the plate, offering a few other suggestions.   Use healthy plant oils, such as olive, canola, soy, corn, sunflower and peanut. Check the labels, and avoid partially hydrogenated oil, which have unhealthy trans fats.   If youre thirsty, drink water. Coffee and tea are good in moderation, but skip sugary drinks and limit milk and dairy products to one or two daily servings.   The type of carbohydrate in the diet is more important than the amount. Some sources of carbohydrates, such as vegetables, fruits, whole grains, and beans-are healthier than others.   Finally, stay  active  Signed, Berniece Salines, DO  06/07/2020 12:12 PM    [place]

## 2020-06-07 NOTE — Patient Instructions (Signed)
Medication Instructions:  Your physician has recommended you make the following change in your medication:   START: Entresto 24/26 mg twice daily   STOP: Losartan   *If you need a refill on your cardiac medications before your next appointment, please call your pharmacy*   Lab Work: Your physician recommends that you return for lab work today: bmp, mg, cbc   If you have labs (blood work) drawn today and your tests are completely normal, you will receive your results only by: Marland Kitchen MyChart Message (if you have MyChart) OR . A paper copy in the mail If you have any lab test that is abnormal or we need to change your treatment, we will call you to review the results.   Testing/Procedures: None.    Follow-Up: At [facility], you and your health needs are our priority.  As part of our continuing mission to provide you with exceptional heart care, we have created designated Provider Care Teams.  These Care Teams include your primary Cardiologist (physician) and Advanced Practice Providers (APPs -  Physician Assistants and Nurse Practitioners) who all work together to provide you with the care you need, when you need it.  We recommend signing up for the patient portal called "MyChart".  Sign up information is provided on this After Visit Summary.  MyChart is used to connect with patients for Virtual Visits (Telemedicine).  Patients are able to view lab/test results, encounter notes, upcoming appointments, etc.  Non-urgent messages can be sent to your provider as well.   To learn more about what you can do with MyChart, go to [email or web address].    Your next appointment:   2 month(s)  The format for your next appointment:   In Person  Provider:   Berniece Salines, DO   Other Instructions  Sacubitril; Valsartan Oral Tablets What is this medicine? SACUBITRIL; VALSARTAN (sak UE bi tril; val SAR tan) is a combination of a neprilysin inhibitor and a an angiotensin II receptor blocker.  It treats heart failure. This medicine may be used for other purposes; ask your health care provider or pharmacist if you have questions. COMMON BRAND NAME(S): Entresto What should I tell my health care provider before I take this medicine? They need to know if you have any of these conditions:  diabetes and take a medicine that contains aliskiren  kidney disease  liver disease  an unusual or allergic reaction to sacubitril; valsartan, drugs called angiotensin converting enzyme (ACE) inhibitors, angiotensin II receptor blockers (ARBs), other medicines, foods, dyes, or preservatives  pregnant or trying to get pregnant  breast-feeding How should I use this medicine? Take this drug by mouth. Take it as directed on the prescription label at the same time every day. You can take it with or without food. If it upsets your stomach, take it with food. Keep taking it unless your health care provider tells you to stop. Talk to your health care provider about the use of this drug in children. While it may be prescribed for children as young as 1 for selected conditions, precautions do apply. Overdosage: If you think you have taken too much of this medicine contact a poison control center or emergency room at once. NOTE: This medicine is only for you. Do not share this medicine with others. What if I miss a dose? If you miss a dose, take it as soon as you can. If it is almost time for your next dose, take only that dose. Do not take double or extra  doses. What may interact with this medicine? Do not take this medicine with any of the following medicines:  aliskiren if you have diabetes  angiotensin-converting enzyme (ACE) inhibitors, like benazepril, captopril, enalapril, fosinopril, lisinopril, or ramipril This medicine may also interact with the following medicines:  angiotensin II receptor blockers (ARBs) like azilsartan, candesartan, eprosartan, irbesartan, losartan, olmesartan, telmisartan,  or valsartan  lithium  NSAIDS, medicines for pain and inflammation, like ibuprofen or naproxen  potassium-sparing diuretics like amiloride, spironolactone, and triamterene  potassium supplements This list may not describe all possible interactions. Give your health care provider a list of all the medicines, herbs, non-prescription drugs, or dietary supplements you use. Also tell them if you smoke, drink alcohol, or use illegal drugs. Some items may interact with your medicine. What should I watch for while using this medicine? Tell your doctor or healthcare professional if your symptoms do not start to get better or if they get worse. Do not become pregnant while taking this medicine. Women should inform their doctor if they wish to become pregnant or think they might be pregnant. There is a potential for serious side effects to an unborn child. Talk to your health care professional or pharmacist for more information. You may get dizzy. Do not drive, use machinery, or do anything that needs mental alertness until you know how this medicine affects you. Do not stand or sit up quickly, especially if you are an older patient. This reduces the risk of dizzy or fainting spells. Avoid alcoholic drinks; they can make you more dizzy. What side effects may I notice from receiving this medicine? Side effects that you should report to your doctor or health care professional as soon as possible:  allergic reactions like skin rash, itching or hives, swelling of the face, lips, or tongue  signs and symptoms of increased potassium like muscle weakness; chest pain; or fast, irregular heartbeat  signs and symptoms of kidney injury like trouble passing urine or change in the amount of urine  signs and symptoms of low blood pressure like feeling dizzy or lightheaded, or if you develop extreme fatigue Side effects that usually do not require medical attention (report to your doctor or health care professional if  they continue or are bothersome):  cough This list may not describe all possible side effects. Call your doctor for medical advice about side effects. You may report side effects to FDA at 1-800-FDA-1088. Where should I keep my medicine? Keep out of the reach of children and pets. Store at room temperature between 20 and 25 degrees C (68 and 77 degrees F). Protect from moisture. Keep the container tightly closed. Throw away any unused drug after the expiration date. NOTE: This sheet is a summary. It may not cover all possible information. If you have questions about this medicine, talk to your doctor, pharmacist, or health care provider.  2020 Elsevier/Gold Standard (2019-03-17 16:03:07)

## 2020-06-08 ENCOUNTER — Telehealth: Payer: Self-pay | Admitting: Cardiology

## 2020-06-08 LAB — CBC
Hematocrit: 53.1 % — ABNORMAL HIGH (ref 37.5–51.0)
Hemoglobin: 16.3 g/dL (ref 13.0–17.7)
MCH: 22.9 pg — ABNORMAL LOW (ref 26.6–33.0)
MCHC: 30.7 g/dL — ABNORMAL LOW (ref 31.5–35.7)
MCV: 75 fL — ABNORMAL LOW (ref 79–97)
Platelets: 477 10*3/uL — ABNORMAL HIGH (ref 150–450)
RBC: 7.13 x10E6/uL (ref 4.14–5.80)
RDW: 17.2 % — ABNORMAL HIGH (ref 11.6–15.4)
WBC: 6.3 10*3/uL (ref 3.4–10.8)

## 2020-06-08 LAB — BASIC METABOLIC PANEL
BUN/Creatinine Ratio: 11 (ref 10–24)
BUN: 10 mg/dL (ref 8–27)
CO2: 26 mmol/L (ref 20–29)
Calcium: 9.9 mg/dL (ref 8.6–10.2)
Chloride: 103 mmol/L (ref 96–106)
Creatinine, Ser: 0.89 mg/dL (ref 0.76–1.27)
GFR calc Af Amer: 102 mL/min/{1.73_m2} (ref >59)
GFR calc non Af Amer: 88 mL/min/{1.73_m2} (ref >59)
Glucose: 79 mg/dL (ref 65–99)
Potassium: 5.1 mmol/L (ref 3.5–5.2)
Sodium: 142 mmol/L (ref 134–144)

## 2020-06-08 LAB — MAGNESIUM: Magnesium: 1.9 mg/dL (ref 1.6–2.3)

## 2020-06-08 NOTE — Telephone Encounter (Signed)
No I spoke to the patient wife already

## 2020-06-08 NOTE — Progress Notes (Signed)
Please call patient and let them  know their  low dose Ct was read as a Lung RADS 1, negative study: no nodules or definitely benign nodules. Radiology recommendation is for a repeat LDCT in 12 months. .Please let them  know we will order and schedule their  annual screening scan for 05/2021. Please let them  know there was notation of CAD on their  scan.  Please remind the patient  that this is a non-gated exam therefore degree or severity of disease  cannot be determined. Please have them  follow up with their PCP regarding potential risk factor modification, dietary therapy or pharmacologic therapy if clinically indicated. Pt.  is  currently on statin therapy. Please place order for annual  screening scan for  05/2021 and fax results to PCP. Thanks so much. 

## 2020-06-08 NOTE — Telephone Encounter (Signed)
Lilia Pro please change his metoprolol succinate in the medication list to 25 mg daily.

## 2020-06-08 NOTE — Telephone Encounter (Signed)
Patient's wife called and said that she really needs to talk with Dr. Harriet Masson regarding the patients medication

## 2020-06-08 NOTE — Telephone Encounter (Signed)
Spoke to the patient just now and let him know that he is supposed to be taking 50 mg once daily. He verbalizes understanding and thanks me for the call back.

## 2020-06-08 NOTE — Telephone Encounter (Signed)
Patient's wife is calling to verify the dosage that the patient should be taking for   metoprolol succinate (TOPROL-XL) 50 MG 24 hr tablet

## 2020-06-19 ENCOUNTER — Other Ambulatory Visit: Payer: Self-pay | Admitting: *Deleted

## 2020-06-19 DIAGNOSIS — Z87891 Personal history of nicotine dependence: Secondary | ICD-10-CM

## 2020-06-21 ENCOUNTER — Ambulatory Visit (INDEPENDENT_AMBULATORY_CARE_PROVIDER_SITE_OTHER): Payer: Medicare Other | Admitting: Internal Medicine

## 2020-06-21 ENCOUNTER — Encounter: Payer: Self-pay | Admitting: Internal Medicine

## 2020-06-21 ENCOUNTER — Other Ambulatory Visit: Payer: Self-pay

## 2020-06-21 VITALS — BP 165/93 | HR 74 | Temp 97.9°F | Resp 16 | Ht 76.0 in | Wt 180.5 lb

## 2020-06-21 DIAGNOSIS — R718 Other abnormality of red blood cells: Secondary | ICD-10-CM

## 2020-06-21 DIAGNOSIS — I255 Ischemic cardiomyopathy: Secondary | ICD-10-CM

## 2020-06-21 DIAGNOSIS — D75839 Thrombocytosis, unspecified: Secondary | ICD-10-CM

## 2020-06-21 NOTE — Progress Notes (Signed)
Pre visit review using our clinic review tool, if applicable. No additional management support is needed unless otherwise documented below in the visit note. 

## 2020-06-21 NOTE — Patient Instructions (Signed)
Will refer you to the hematology service

## 2020-06-21 NOTE — Progress Notes (Signed)
Subjective:    Patient ID: [name], male    DOB: 12-09-1950, 69 y.o.   MRN: [id number]  DOS:  06/21/2020 Type of visit - description: Acute Patient is here with his wife, they are concerned about increased platelets. He reports no symptoms such as headaches, chest pain, difficulty breathing or claudication.  BP today slightly elevated, at home is typically less than 140, this morning he was in the 120s.   Review of Systems See above   Past Medical History:  Diagnosis Date  . Abnormal myocardial perfusion study 2006   EF 44% ? inferoseptal ischemia. no cath done  . Benign neoplasm of cecum   . Breast mass, right 03/2008  . Cardiomyopathy ([place]) 07/20/2018   Ejection fraction 4045% in September 2019  . Centrilobular emphysema ([place]) 08/25/2018   02/04/2018-high-res CT- no evidence of interstitial lung disease, severe centrilobular emphysema and mild diffuse bronchial wall thickening suggesting COPD, no calcified pleural plaques no pleural effusion, mild smooth bilateral pleural thickening which is nonspecific  02/04/2018-pulmonary function test- FVC 3.37 (71% predicted), postbronchodilator ratio 50, postbronchodilator FEV1 54, mid flow reve  . COLONIC POLYPS, HX OF 03/18/2007   Qualifier: Diagnosis of  By: [name]   . Contact dermatitis 01/31/2013  . COPD (chronic obstructive pulmonary disease) ([place])    "dx'd 12/2017"  . COPD GOLD II A  08/25/2018   02/04/2018-high-res CT- no evidence of interstitial lung disease, severe centrilobular emphysema and mild diffuse bronchial wall thickening suggesting COPD, no calcified pleural plaques no pleural effusion, mild smooth bilateral pleural thickening which is nonspecific  02/04/2018-pulmonary function test- FVC 3.37 (71% predicted), postbronchodilator ratio 50, postbronchodilator FEV1 54, mid flow reve  . Coronary artery disease   . Critical aortic valve stenosis 01/12/2018   Calculated valve area less than 0.5 cm mean gradient  more than 60 mm of mercury  . DEGENERATIVE JOINT DISEASE, BACK 03/18/2007   Qualifier: Diagnosis of  By: [name]   . Depressed left ventricular ejection fraction 03/28/2020  . Dyspnea on exertion 01/12/2018  . ERECTILE DYSFUNCTION, ORGANIC 02/10/2009   Qualifier: Diagnosis of  By: [name]   . Heart murmur   . HEMORRHOIDS, INTERNAL 03/18/2007   Qualifier: Diagnosis of  By: [name]   . Hyperlipemia 10/21/2007   Qualifier: Diagnosis of  By: [name]   . Hypertension   . Hypoglycemia due to insulin 03/26/2020  . Low back pain   . MYOCARDIAL PERFUSION SCAN, WITH STRESS TEST, ABNORMAL 08/11/2008   Qualifier: Diagnosis of  By: Johnsie Cancel, MD, Rona Ravens   . Nocturnal hypoxemia 08/25/2018   04/09/18 >>> ONO - telephone note>>> Patient's ONO on room air showed that he was less than 88% for more than an hour. Patient needs to be on 2 L at HS.  10/06/2018-overnight oximetry-entire duration was 4 hours and 23 minutes, SPO2 less than 88% for 25 minutes and 32 seconds    . Oxygen deficiency    uses 2L at night  . Paroxysmal atrial fibrillation ([place]) 10/12/2018  . S/P AVR 03/04/2018   AVR 23 mm Edwards Magna Valve - bioprostetic 03/04/2018 LIMA - LAD  . Status post coronary artery bypass graft 03/20/2018   LIMA to LAD during aortic valve replacement surgery 03/04/2018  . Type 2 diabetes mellitus, uncontrolled, with neuropathy ([place]) 05/20/2008   Qualifier: Diagnosis of  By: [name]     Past Surgical History:  Procedure  Laterality Date  . ABDOMINAL AORTOGRAM N/A 01/13/2018   Procedure: ABDOMINAL AORTOGRAM;  Surgeon: Troy Sine, MD;  Location: [place] [facility];  Service: Cardiovascular;  Laterality: N/A;  . AORTIC VALVE REPLACEMENT N/A 03/04/2018   Procedure: AORTIC VALVE REPLACEMENT (AVR) 29mm Edwards Magna Ease Tissue Valve.;  Surgeon: Melrose Nakayama, MD;  Location: [place];  Service: Open Heart Surgery;  Laterality: N/A;  . CARDIAC VALVE REPLACEMENT     . COLONOSCOPY W/ POLYPECTOMY    . COLONOSCOPY WITH PROPOFOL N/A 02/22/2020   Procedure: COLONOSCOPY WITH PROPOFOL;  Surgeon: Doran Stabler, MD;  Location: [facility];  Service: Gastroenterology;  Laterality: N/A;  . CORONARY ARTERY BYPASS GRAFT N/A 03/04/2018   Procedure: CORONARY ARTERY BYPASS GRAFTING (CABG) x1:  LIMA to LAD.;  Surgeon: Melrose Nakayama, MD;  Location: [place];  Service: Open Heart Surgery;  Laterality: N/A;  . HEMOSTASIS CLIP PLACEMENT  02/22/2020   Procedure: HEMOSTASIS CLIP PLACEMENT;  Surgeon: Doran Stabler, MD;  Location: [facility];  Service: Gastroenterology;;  . Cruzita Lederer PRESSURE WIRE/FFR STUDY N/A 05/10/2020   Procedure: INTRAVASCULAR PRESSURE WIRE/FFR STUDY;  Surgeon: Sherren Mocha, MD;  Location: [place] [facility];  Service: Cardiovascular;  Laterality: N/A;  . LEFT HEART CATH AND CORS/GRAFTS ANGIOGRAPHY N/A 05/10/2020   Procedure: LEFT HEART CATH AND CORS/GRAFTS ANGIOGRAPHY;  Surgeon: Sherren Mocha, MD;  Location: [place] [facility];  Service: Cardiovascular;  Laterality: N/A;  . LIPOMA EXCISION Left 07/2008    "back of shoulder" Dr Gershon Crane  . POLYPECTOMY  02/22/2020   Procedure: POLYPECTOMY;  Surgeon: Doran Stabler, MD;  Location: [facility];  Service: Gastroenterology;;  . RIGHT/LEFT HEART CATH AND CORONARY ANGIOGRAPHY N/A 01/13/2018   Procedure: RIGHT/LEFT HEART CATH AND CORONARY ANGIOGRAPHY;  Surgeon: Troy Sine, MD;  Location: [place] [facility];  Service: Cardiovascular;  Laterality: N/A;  . TEE WITHOUT CARDIOVERSION N/A 03/04/2018   Procedure: TRANSESOPHAGEAL ECHOCARDIOGRAM (TEE);  Surgeon: Melrose Nakayama, MD;  Location: [place];  Service: Open Heart Surgery;  Laterality: N/A;  . TONSILLECTOMY    . WISDOM TOOTH EXTRACTION      Allergies as of 06/21/2020      Reactions   Amlodipine Besylate Other (See Comments)   REACTION: ? caused left axillary nodules/chest flutter   Hydrochlorothiazide Hives   Lisinopril     Hyperkalemia      Medication List       Accurate as of June 21, 2020 11:59 PM. If you have any questions, ask your nurse or doctor.        acetaminophen 500 MG tablet Commonly known as: TYLENOL Take 1,000 mg by mouth every 6 (six) hours as needed for moderate pain or headache.   aspirin 325 MG EC tablet Take 1 tablet (325 mg total) by mouth daily.   cetirizine 10 MG tablet Commonly known as: ZYRTEC Take 10 mg by mouth daily as needed for allergies.   Entresto 24-26 MG Generic drug: sacubitril-valsartan Take 1 tablet by mouth 2 (two) times daily. ENTRESTO FREE MONTH CARD RXBIN [id number] RXGRP [id number] RXPCN [facility]  ID [id number]   glucose blood test strip Commonly known as: Prodigy No Coding Blood Gluc Use as instructed to check blood sugar twice a day.  DX  E11.40   hydrALAZINE 10 MG tablet Commonly known as: APRESOLINE Take 1 tablet (10 mg total) by mouth 3 (three) times daily.   Insulin Pen Needle 32G X 4 MM Misc 1 Device by Does not apply  route as directed.   Levemir FlexTouch 100 UNIT/ML FlexPen Generic drug: insulin detemir Inject 20 Units into the skin daily. Inject 25 units under the skin once daily. What changed: additional instructions   metFORMIN 1000 MG tablet Commonly known as: GLUCOPHAGE Take 500 mg by mouth daily.   metoprolol succinate 50 MG 24 hr tablet Commonly known as: TOPROL-XL Take 25 mg by mouth daily. Take with or immediately following a meal.   NovoLOG FlexPen 100 UNIT/ML FlexPen Generic drug: insulin aspart Inject 8 Units into the skin 3 (three) times daily with meals. Inject 10 units under the skin once daily before dinner. What changed: additional instructions   rosuvastatin 20 MG tablet Commonly known as: CRESTOR Take 1 tablet (20 mg total) by mouth daily.   spironolactone 25 MG tablet Commonly known as: Aldactone Take 0.5 tablets (12.5 mg total) by mouth daily.   VITAMIN B-12 PO Take 1 tablet by mouth daily.           Objective:   Physical Exam BP (!) 165/93 (BP Location: Left Arm, Patient Position: Sitting, Cuff Size: Small)   Pulse 74   Temp 97.9 F (36.6 C) (Oral)   Resp 16   Ht 6\' 4"  (1.93 m)   Wt 180 lb 8 oz (81.9 kg)   SpO2 94%   BMI 21.97 kg/m  General:   Well developed, NAD, BMI noted. HEENT:  Normocephalic . Face symmetric, atraumatic Skin: Not pale. Not jaundice Neurologic:  alert & oriented X3.  Speech normal, gait appropriate for age and unassisted Psych--  Cognition and judgment appear intact.  Cooperative with normal attention span and concentration.  Behavior appropriate. No anxious or depressed appearing.      Assessment     Assessment (new patient 03/2020) DM HTN CAD, CABG and aortic valve replacement due to stenosis on 02/2018, cardiomyopathy, cath 05/10/2020, Rx medical treatment COPD  PLAN Microcytosis, thrombocytosis: The patient and wife main concern is thrombocytosis which started few months ago.  He has no symptoms.  Reactive thrombocytosis?. Also, long history of microcytosis, no anemia except  after CABG 2019, y thalassemia? Plan: Refer to hematology for further evaluation. CAD, cardiomyopathy: Saw cardiology 06/07/2020.  He was transition from losartan to [facility].  BP today slightly elevated, at home is better.  This morning S BP was  120s.  Recommend to continue monitoring and let us know if BPs are not controlled.   This visit occurred during the SARS-CoV-2 public health emergency.  Safety protocols were in place, including screening questions prior to the visit, additional usage of staff PPE, and extensive cleaning of exam room while observing appropriate contact time as indicated for disinfecting solutions.

## 2020-06-22 ENCOUNTER — Telehealth: Payer: Self-pay | Admitting: Family

## 2020-06-22 NOTE — Assessment & Plan Note (Signed)
Microcytosis, thrombocytosis: The patient and wife main concern is thrombocytosis which started few months ago.  He has no symptoms.  Reactive thrombocytosis?. Also, long history of microcytosis, no anemia except  after CABG 2019, y thalassemia? Plan: Refer to hematology for further evaluation. CAD, cardiomyopathy: Saw cardiology 06/07/2020.  He was transition from losartan to [facility].  BP today slightly elevated, at home is better.  This morning S BP was  120s.  Recommend to continue monitoring and let us know if BPs are not controlled.

## 2020-06-22 NOTE — Telephone Encounter (Signed)
Tried to call and LMVM for patient however VM was full.  I have mailed a New patient Packet to him along with a Calendar for upcoming appointments

## 2020-06-27 ENCOUNTER — Telehealth (HOSPITAL_COMMUNITY): Payer: Self-pay | Admitting: Vascular Surgery

## 2020-06-27 ENCOUNTER — Telehealth: Payer: Self-pay | Admitting: Cardiology

## 2020-06-27 ENCOUNTER — Ambulatory Visit: Payer: Medicare Other | Admitting: Family

## 2020-06-27 NOTE — Telephone Encounter (Signed)
Patient's wife called to follow up regarding referral to the [facility]. She would like to discuss why patient was referred. Please call.

## 2020-06-27 NOTE — Telephone Encounter (Signed)
Spoke to pt to make new chf appt, pt wanted to consult w/ referring doctor before making this appt, pt states he had idea that he was being referred to Heart failure clinic

## 2020-06-27 NOTE — Telephone Encounter (Signed)
Called patient's wife (DPR) about her message. Informed her that Dr. Harriet Masson wanted patient to see HF clinic due to his low EF and starting Entresto. Patient's wife stated she will try to call HF Clinic to make an appointment now that she knows what it is for.

## 2020-07-01 ENCOUNTER — Other Ambulatory Visit: Payer: Self-pay | Admitting: Family

## 2020-07-05 ENCOUNTER — Telehealth: Payer: Self-pay

## 2020-07-05 NOTE — Progress Notes (Signed)
ADVANCED HF CLINIC CONSULT NOTE  Referring Physician: Dr. Harriet Masson Primary Care: Colon Branch, MD  Primary Cardiologist: Dr. Harriet Masson  HPI:  [name] Moan Corneliusis a 69 y.o.maleformer smoker (37 py, quit 2012) with COPD, non-obstructive CAD, severe aortic stenosis s/p bioprosthetic AVR/CABG (LIMA to D1 -> LAD)  in 2019, non-ischemic cardiomyopathy most recent EF on March 27, 2020 was 25 to 30%, OSA, hypertension hyperlipidemia and diabetes referred by Dr. Harriet Masson for further evaluation of his HF.  Echo at time of AVR in 2019 EF 35-40%  Underwent LHC in 9/21 for worsening fatigue and DOE.  LM ok LAD 65% LCX 30% Ramus 65% RCA 20%  Patent but atretic LIMA graft LVEDP 7  He is here today for follow-up visit. 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 Fairly active around the house but has been slowing down down some. (now takes 2 days to cut the grass instead of 1). Can around the store without stopping. Says he gets his exercise in there. No CP. No edema, orthopnea or PND. Rare dizziness when standing. Takes BP regularly. Usually 140/80s. Not wearing CPAP.   PFTs 2019 FEV 1 1.77 (49%) FVC 3.37 (71%) Ratio 53% DLCO 36%  Review of Systems: [y] = yes, [ ]  = no   General: Weight gain [ ] ; Weight loss [ ] ; Anorexia [ ] ; Fatigue [ ] ; Fever [ ] ; Chills [ ] ; Weakness [ ]   Cardiac: Chest pain/pressure [ ] ; Resting SOB [ ] ; Exertional SOB Blue.Reese ]; Orthopnea [ ] ; Pedal Edema [ ] ; Palpitations [ ] ; Syncope [ ] ; Presyncope [ ] ; Paroxysmal nocturnal dyspnea[ ]   Pulmonary: Cough [ ] ; Wheezing[ ] ; Hemoptysis[ ] ; Sputum [ ] ; Snoring [ ]   GI: Vomiting[ ] ; Dysphagia[ ] ; Melena[ ] ; Hematochezia [ ] ; Heartburn[ ] ; Abdominal pain [ ] ; Constipation [ ] ; Diarrhea [ ] ; BRBPR [ ]   GU: Hematuria[ ] ; Dysuria [ ] ; Nocturia[ ]   Vascular: Pain in legs with walking [ ] ; Pain in feet with lying flat [ ] ; Non-healing sores [ ] ; Stroke [ ] ; TIA [ ] ; Slurred speech [ ] ;  Neuro: Headaches[ ] ; Vertigo[ ] ; Seizures[ ] ;  Paresthesias[ ] ;Blurred vision [ ] ; Diplopia [ ] ; Vision changes [ ]   Ortho/Skin: Arthritis Blue.Reese ]; Joint pain Blue.Reese ]; Muscle pain [ ] ; Joint swelling [ ] ; Back Pain [ ] ; Rash [ ]   Psych: Depression[ ] ; Anxiety[ ]   Heme: Bleeding problems [ ] ; Clotting disorders [ ] ; Anemia [ ]   Endocrine: Diabetes Blue.Reese ]; Thyroid dysfunction[ ]    Past Medical History:  Diagnosis Date   Abnormal myocardial perfusion study 2006   EF 44% ? inferoseptal ischemia. no cath done   Benign neoplasm of cecum    Breast mass, right 03/2008   Cardiomyopathy ([place]) 07/20/2018   Ejection fraction 4045% in September 2019   Centrilobular emphysema ([place]) 08/25/2018   02/04/2018-high-res CT- no evidence of interstitial lung disease, severe centrilobular emphysema and mild diffuse bronchial wall thickening suggesting COPD, no calcified pleural plaques no pleural effusion, mild smooth bilateral pleural thickening which is nonspecific  02/04/2018-pulmonary function test- FVC 3.37 (71% predicted), postbronchodilator ratio 50, postbronchodilator FEV1 54, mid flow reve   COLONIC POLYPS, HX OF 03/18/2007   Qualifier: Diagnosis of  By: [name]    Contact dermatitis 01/31/2013   COPD (chronic obstructive pulmonary disease) ([place])    "dx'd 12/2017"   COPD GOLD II A  08/25/2018   02/04/2018-high-res CT- no evidence of interstitial lung disease, severe centrilobular emphysema and  mild diffuse bronchial wall thickening suggesting COPD, no calcified pleural plaques no pleural effusion, mild smooth bilateral pleural thickening which is nonspecific  02/04/2018-pulmonary function test- FVC 3.37 (71% predicted), postbronchodilator ratio 50, postbronchodilator FEV1 54, mid flow reve   Coronary artery disease    Critical aortic valve stenosis 01/12/2018   Calculated valve area less than 0.5 cm mean gradient more than 60 mm of mercury   DEGENERATIVE JOINT DISEASE, BACK 03/18/2007   Qualifier: Diagnosis of  By: [name]     Depressed left ventricular ejection fraction 03/28/2020   Dyspnea on exertion 01/12/2018   ERECTILE DYSFUNCTION, ORGANIC 02/10/2009   Qualifier: Diagnosis of  By: [name]    Heart murmur    HEMORRHOIDS, INTERNAL 03/18/2007   Qualifier: Diagnosis of  By: [name]    Hyperlipemia 10/21/2007   Qualifier: Diagnosis of  By: [name]    Hypertension    Hypoglycemia due to insulin 03/26/2020   Low back pain    MYOCARDIAL PERFUSION SCAN, WITH STRESS TEST, ABNORMAL 08/11/2008   Qualifier: Diagnosis of  By: Johnsie Cancel, MD, Rona Ravens    Nocturnal hypoxemia 08/25/2018   04/09/18 >>> ONO - telephone note>>> Patient's ONO on room air showed that he was less than 88% for more than an hour. Patient needs to be on 2 L at HS.  10/06/2018-overnight oximetry-entire duration was 4 hours and 23 minutes, SPO2 less than 88% for 25 minutes and 32 seconds     Oxygen deficiency    uses 2L at night   Paroxysmal atrial fibrillation ([place]) 10/12/2018   S/P AVR 03/04/2018   AVR 23 mm Edwards Magna Valve - bioprostetic 03/04/2018 LIMA - LAD   Status post coronary artery bypass graft 03/20/2018   LIMA to LAD during aortic valve replacement surgery 03/04/2018   Type 2 diabetes mellitus, uncontrolled, with neuropathy ([place]) 05/20/2008   Qualifier: Diagnosis of  By: [name]     Current Outpatient Medications  Medication Sig Dispense Refill   acetaminophen (TYLENOL) 500 MG tablet Take 1,000 mg by mouth every 6 (six) hours as needed for moderate pain or headache.     aspirin EC 325 MG EC tablet Take 1 tablet (325 mg total) by mouth daily.  0   cetirizine (ZYRTEC) 10 MG tablet Take 10 mg by mouth daily as needed for allergies.      Cyanocobalamin (VITAMIN B-12 PO) Take 1 tablet by mouth daily.     glucose blood (PRODIGY NO CODING BLOOD GLUC) test strip Use as instructed to check blood sugar twice a day.  DX  E11.40 100 each 1   hydrALAZINE (APRESOLINE) 10 MG tablet Take 1  tablet (10 mg total) by mouth 3 (three) times daily. 270 tablet 1   insulin aspart (NOVOLOG FLEXPEN) 100 UNIT/ML FlexPen Inject 8 Units into the skin 2 (two) times daily with a meal.     insulin detemir (LEVEMIR FLEXTOUCH) 100 UNIT/ML FlexPen Inject 26 Units into the skin every morning.     Insulin Pen Needle 32G X 4 MM MISC 1 Device by Does not apply route as directed. 100 each 6   metFORMIN (GLUCOPHAGE) 1000 MG tablet Take 500 mg by mouth daily.      metoprolol succinate (TOPROL-XL) 50 MG 24 hr tablet Take 25 mg by mouth daily. Take with or immediately following a meal.     rosuvastatin (CRESTOR) 20 MG tablet Take 1 tablet (20 mg total) by  mouth daily. 90 tablet 0   sacubitril-valsartan (ENTRESTO) 24-26 MG Take 1 tablet by mouth 2 (two) times daily. ENTRESTO FREE MONTH CARD RXBIN [id number] RXGRP [id number] RXPCN [facility]  ID [id number] 60 tablet 3   spironolactone (ALDACTONE) 25 MG tablet Take 0.5 tablets (12.5 mg total) by mouth daily. 45 tablet 3   sacubitril-valsartan (ENTRESTO) 49-51 MG Take 1 tablet by mouth 2 (two) times daily. 60 tablet 3   No current facility-administered medications for this encounter.    Allergies  Allergen Reactions   Amlodipine Besylate Other (See Comments)    REACTION: ? caused left axillary nodules/chest flutter   Hydrochlorothiazide Hives   Lisinopril     Hyperkalemia       Social History   Socioeconomic History   Marital status: Married    Spouse name: Not on file   Number of children: Not on file   Years of education: Not on file   Highest education level: Not on file  Occupational History   Not on file  Tobacco Use   Smoking status: Former Smoker    Packs/day: 1.50    Years: 41.00    Pack years: 61.50    Types: Cigarettes    Quit date: 08/26/2010    Years since quitting: 9.8   Smokeless tobacco: Never Used   Tobacco comment: Quit 2012  Vaping Use   Vaping Use: Never used  Substance and Sexual Activity   Alcohol use: Yes     Comment: occasionally   Drug use: Not Currently   Sexual activity: Not Currently  Other Topics Concern   Not on file  Social History Narrative   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   Married    Former Smoker -  33 pack year history   Alcohol use-no     Drug use-no             Social Determinants of Radio broadcast assistant Strain: Low Risk    Difficulty of Paying Living Expenses: Not hard at all  Food Insecurity: No Food Insecurity   Worried About Charity fundraiser in the Last Year: Never true   Arboriculturist in the Last Year: Never true  Transportation Needs: No Transportation Needs   Lack of Transportation (Medical): No   Lack of Transportation (Non-Medical): No  Physical Activity:    Days of Exercise per Week: Not on file   Minutes of Exercise per Session: Not on file  Stress:    Feeling of Stress : Not on file  Social Connections:    Frequency of Communication with Friends and Family: Not on file   Frequency of Social Gatherings with Friends and Family: Not on file   Attends Religious Services: Not on file   Active Member of Clubs or Organizations: Not on file   Attends Archivist Meetings: Not on file   Marital Status: Not on file  Intimate Partner Violence:    Fear of Current or Ex-Partner: Not on file   Emotionally Abused: Not on file   Physically Abused: Not on file   Sexually Abused: Not on file      Family History  Problem Relation Age of Onset   Melanoma Father 39       deceased secondary to melanoma   Alzheimer's disease Father    Diabetes Father    Hypertension Mother        alive -59   Colon cancer Neg Hx    Esophageal cancer Neg Hx  Colon polyps Neg Hx    Stomach cancer Neg Hx     Vitals:   07/06/20 0940  BP: (!) 130/92  Pulse: 82  SpO2: 97%  Weight: 81.5 kg (179 lb 9.6 oz)    PHYSICAL EXAM: General:  Well appearing. No respiratory difficulty HEENT: normal Neck: supple. no JVD.  Carotids 2+ bilat; no bruits. No lymphadenopathy or thryomegaly appreciated. Cor: PMI nondisplaced. Regular rate & rhythm. No rubs, gallops or murmurs. Lungs: clear Abdomen: soft, nontender, nondistended. No hepatosplenomegaly. No bruits or masses. Good bowel sounds. Extremities: no cyanosis, clubbing, rash, edema Neuro: alert & oriented x 3, cranial nerves grossly intact. moves all 4 extremities w/o difficulty. Affect pleasant.  ECG: NSR 78 with LVH non-specific ST sagging Personally reviewed   ASSESSMENT & PLAN:  1. Chronic systolic HF - primarily NICM. Had LIMA to LAD at time of CABG 2019 but on cath in 9/21 LAD lesion 65% and LIMA atretic - echo 2019 EF 35-40% - Echo 8/21 EF 25-30% - Doing well NYHA II - Volume status ok. Recent cath with LVEDP 7 - Increase Entresto to 49/51 bid - Continue Toprol 25 daily - Continue spiro 12.5 daily - Continue hydralazine 10 tid - Plan cMRI t further evaluate etiology of CM - If becomes more symptomatic will need CPX to assess lung vs cardiac limitation  - Dicussed possible referral for CID based on EF by cMRI  2. Severe AS - s/p bioprosthetic AVR 2019 - recent cath with no gradient - reinforced need for SBE prophylaxis  3. CAD - Had LIMA to LAD at time of CABG 2019 but on cath in 9/21 LAD lesion 65% and LIMA atretic - Continue ASA/statin  4. COPD, Gold II - followed by Pulmonary  - wears 2L O2 at night  - we did hal walk in clinic and sats > 90%  Glori Bickers, MD  5:02 PM

## 2020-07-05 NOTE — Telephone Encounter (Signed)
**Note De-Identified [name] Obfuscation** I started a Entresto PA through covermymeds: Key: [id number]

## 2020-07-06 ENCOUNTER — Ambulatory Visit (HOSPITAL_COMMUNITY)
Admission: RE | Admit: 2020-07-06 | Discharge: 2020-07-06 | Disposition: A | Discharge status: Home / Self Care | Payer: Medicare Other | Source: Ambulatory Visit | Attending: Internal Medicine | Admitting: Internal Medicine

## 2020-07-06 ENCOUNTER — Other Ambulatory Visit: Payer: Self-pay

## 2020-07-06 ENCOUNTER — Encounter (HOSPITAL_COMMUNITY): Payer: Self-pay | Admitting: Internal Medicine

## 2020-07-06 VITALS — BP 130/92 | HR 82 | Wt 179.6 lb

## 2020-07-06 DIAGNOSIS — R079 Chest pain, unspecified: Secondary | ICD-10-CM | POA: Diagnosis not present

## 2020-07-06 DIAGNOSIS — R0989 Other specified symptoms and signs involving the circulatory and respiratory systems: Secondary | ICD-10-CM | POA: Diagnosis not present

## 2020-07-06 DIAGNOSIS — I5022 Chronic systolic (congestive) heart failure: Secondary | ICD-10-CM | POA: Diagnosis not present

## 2020-07-06 DIAGNOSIS — I110 Hypertensive heart disease with heart failure: Secondary | ICD-10-CM | POA: Insufficient documentation

## 2020-07-06 DIAGNOSIS — Z7982 Long term (current) use of aspirin: Secondary | ICD-10-CM | POA: Insufficient documentation

## 2020-07-06 DIAGNOSIS — Z953 Presence of xenogenic heart valve: Secondary | ICD-10-CM | POA: Insufficient documentation

## 2020-07-06 DIAGNOSIS — Z8249 Family history of ischemic heart disease and other diseases of the circulatory system: Secondary | ICD-10-CM | POA: Insufficient documentation

## 2020-07-06 DIAGNOSIS — J449 Chronic obstructive pulmonary disease, unspecified: Secondary | ICD-10-CM | POA: Diagnosis not present

## 2020-07-06 DIAGNOSIS — I428 Other cardiomyopathies: Secondary | ICD-10-CM | POA: Insufficient documentation

## 2020-07-06 DIAGNOSIS — Z952 Presence of prosthetic heart valve: Secondary | ICD-10-CM | POA: Diagnosis not present

## 2020-07-06 DIAGNOSIS — Z79899 Other long term (current) drug therapy: Secondary | ICD-10-CM | POA: Diagnosis not present

## 2020-07-06 DIAGNOSIS — E1140 Type 2 diabetes mellitus with diabetic neuropathy, unspecified: Secondary | ICD-10-CM | POA: Diagnosis not present

## 2020-07-06 DIAGNOSIS — I2510 Atherosclerotic heart disease of native coronary artery without angina pectoris: Secondary | ICD-10-CM | POA: Insufficient documentation

## 2020-07-06 DIAGNOSIS — E785 Hyperlipidemia, unspecified: Secondary | ICD-10-CM | POA: Diagnosis not present

## 2020-07-06 DIAGNOSIS — I350 Nonrheumatic aortic (valve) stenosis: Secondary | ICD-10-CM | POA: Insufficient documentation

## 2020-07-06 DIAGNOSIS — R42 Dizziness and giddiness: Secondary | ICD-10-CM | POA: Insufficient documentation

## 2020-07-06 DIAGNOSIS — Z87891 Personal history of nicotine dependence: Secondary | ICD-10-CM | POA: Diagnosis not present

## 2020-07-06 DIAGNOSIS — Z951 Presence of aortocoronary bypass graft: Secondary | ICD-10-CM | POA: Diagnosis not present

## 2020-07-06 DIAGNOSIS — Z794 Long term (current) use of insulin: Secondary | ICD-10-CM | POA: Diagnosis not present

## 2020-07-06 LAB — CBC
HCT: 53.2 % — ABNORMAL HIGH (ref 39.0–52.0)
Hemoglobin: 16.3 g/dL (ref 13.0–17.0)
MCH: 22.5 pg — ABNORMAL LOW (ref 26.0–34.0)
MCHC: 30.6 g/dL (ref 30.0–36.0)
MCV: 73.5 fL — ABNORMAL LOW (ref 80.0–100.0)
Platelets: 514 10*3/uL — ABNORMAL HIGH (ref 150–400)
RBC: 7.24 MIL/uL — ABNORMAL HIGH (ref 4.22–5.81)
RDW: 17.9 % — ABNORMAL HIGH (ref 11.5–15.5)
WBC: 5.7 10*3/uL (ref 4.0–10.5)
nRBC: 0 % (ref 0.0–0.2)

## 2020-07-06 LAB — BASIC METABOLIC PANEL
Anion gap: 10 (ref 5–15)
BUN: 8 mg/dL (ref 8–23)
CO2: 24 mmol/L (ref 22–32)
Calcium: 9.3 mg/dL (ref 8.9–10.3)
Chloride: 108 mmol/L (ref 98–111)
Creatinine, Ser: 0.81 mg/dL (ref 0.61–1.24)
GFR, Estimated: >60 mL/min (ref >60)
Glucose, Bld: 88 mg/dL (ref 70–99)
Potassium: 4.1 mmol/L (ref 3.5–5.1)
Sodium: 142 mmol/L (ref 135–145)

## 2020-07-06 LAB — BRAIN NATRIURETIC PEPTIDE: B Natriuretic Peptide: 61 pg/mL (ref 0.0–100.0)

## 2020-07-06 MED ORDER — ENTRESTO 49-51 MG PO TABS
1.0000 | ORAL_TABLET | Freq: Two times a day (BID) | ORAL | 3 refills | Status: DC
Start: 2020-07-06 — End: 2020-08-07

## 2020-07-06 NOTE — Patient Instructions (Signed)
Increase Entresto to 49/51 (1 tablet 2x a day)  Labs done today, your results will be available in MyChart, we will contact you for abnormal readings.   Your physician has requested that you have a cardiac MRI. Cardiac MRI uses a computer to create images of your heart as its beating, producing both still and moving pictures of your heart and major blood vessels. For further information please visit [email or web address] Please follow the instruction sheet given to you today for more information.   Once approved  by insurance we will call to schedule an appointment  Your physician recommends that you schedule a follow-up appointment in: Pharmacy clinic soon.  Your physician recommends that you schedule a follow-up appointment in: 3 months  If you have any questions or concerns before your next appointment please send [place] a message through [place] or call our office at [phone].    TO LEAVE A MESSAGE FOR THE NURSE SELECT OPTION 2, PLEASE LEAVE A MESSAGE INCLUDING: . YOUR NAME . DATE OF BIRTH . CALL BACK NUMBER . REASON FOR CALL**this is important as we prioritize the call backs  [place] AS LONG AS YOU CALL BEFORE 4:00 PM   At the [facility], you and your health needs are our priority. As part of our continuing mission to provide you with exceptional heart care, we have created designated Provider Care Teams. These Care Teams include your primary Cardiologist (physician) and Advanced Practice Providers (APPs- Physician Assistants and Nurse Practitioners) who all work together to provide you with the care you need, when you need it.   You may see any of the following providers on your designated Care Team at your next follow up: Marland Kitchen Dr Glori Bickers . Dr Loralie Champagne . Darrick Grinder, NP . Lyda Jester, PA . Audry Riles, PharmD   Please be sure to bring in all your medications bottles to every appointment.

## 2020-07-10 ENCOUNTER — Other Ambulatory Visit: Payer: Self-pay | Admitting: Family

## 2020-07-10 DIAGNOSIS — D75839 Thrombocytosis, unspecified: Secondary | ICD-10-CM

## 2020-07-10 DIAGNOSIS — D473 Essential (hemorrhagic) thrombocythemia: Secondary | ICD-10-CM

## 2020-07-10 DIAGNOSIS — D751 Secondary polycythemia: Secondary | ICD-10-CM

## 2020-07-11 ENCOUNTER — Inpatient Hospital Stay: Payer: Medicare Other | Attending: Hematology & Oncology

## 2020-07-11 ENCOUNTER — Encounter: Payer: Self-pay | Admitting: Family

## 2020-07-11 ENCOUNTER — Other Ambulatory Visit: Payer: Self-pay

## 2020-07-11 ENCOUNTER — Inpatient Hospital Stay (HOSPITAL_BASED_OUTPATIENT_CLINIC_OR_DEPARTMENT_OTHER): Payer: Medicare Other | Admitting: Family

## 2020-07-11 ENCOUNTER — Telehealth: Payer: Self-pay | Admitting: *Deleted

## 2020-07-11 ENCOUNTER — Telehealth: Payer: Self-pay

## 2020-07-11 VITALS — BP 153/99 | HR 85 | Temp 97.9°F | Resp 17 | Ht 76.0 in | Wt 181.1 lb

## 2020-07-11 DIAGNOSIS — D75839 Thrombocytosis, unspecified: Secondary | ICD-10-CM | POA: Diagnosis not present

## 2020-07-11 DIAGNOSIS — D751 Secondary polycythemia: Secondary | ICD-10-CM | POA: Diagnosis not present

## 2020-07-11 DIAGNOSIS — I255 Ischemic cardiomyopathy: Secondary | ICD-10-CM | POA: Diagnosis not present

## 2020-07-11 DIAGNOSIS — D473 Essential (hemorrhagic) thrombocythemia: Secondary | ICD-10-CM

## 2020-07-11 DIAGNOSIS — R718 Other abnormality of red blood cells: Secondary | ICD-10-CM | POA: Insufficient documentation

## 2020-07-11 LAB — CMP (CANCER CENTER ONLY)
ALT: 9 U/L (ref 0–44)
AST: 15 U/L (ref 15–41)
Albumin: 4.3 g/dL (ref 3.5–5.0)
Alkaline Phosphatase: 47 U/L (ref 38–126)
Anion gap: 6 (ref 5–15)
BUN: 14 mg/dL (ref 8–23)
CO2: 30 mmol/L (ref 22–32)
Calcium: 10 mg/dL (ref 8.9–10.3)
Chloride: 105 mmol/L (ref 98–111)
Creatinine: 0.94 mg/dL (ref 0.61–1.24)
GFR, Estimated: >60 mL/min (ref >60)
Glucose, Bld: 102 mg/dL — ABNORMAL HIGH (ref 70–99)
Potassium: 4.1 mmol/L (ref 3.5–5.1)
Sodium: 141 mmol/L (ref 135–145)
Total Bilirubin: 0.5 mg/dL (ref 0.3–1.2)
Total Protein: 7.3 g/dL (ref 6.5–8.1)

## 2020-07-11 LAB — IRON AND TIBC
Iron: 69 ug/dL (ref 42–163)
Saturation Ratios: 19 % — ABNORMAL LOW (ref 20–55)
TIBC: 363 ug/dL (ref 202–409)
UIBC: 294 ug/dL (ref 117–376)

## 2020-07-11 LAB — CBC WITH DIFFERENTIAL (CANCER CENTER ONLY)
Abs Immature Granulocytes: 0.04 10*3/uL (ref 0.00–0.07)
Basophils Absolute: 0 10*3/uL (ref 0.0–0.1)
Basophils Relative: 1 %
Eosinophils Absolute: 0.1 10*3/uL (ref 0.0–0.5)
Eosinophils Relative: 2 %
HCT: 53.7 % — ABNORMAL HIGH (ref 39.0–52.0)
Hemoglobin: 16.3 g/dL (ref 13.0–17.0)
Immature Granulocytes: 1 %
Lymphocytes Relative: 31 %
Lymphs Abs: 2 10*3/uL (ref 0.7–4.0)
MCH: 22.3 pg — ABNORMAL LOW (ref 26.0–34.0)
MCHC: 30.4 g/dL (ref 30.0–36.0)
MCV: 73.4 fL — ABNORMAL LOW (ref 80.0–100.0)
Monocytes Absolute: 0.6 10*3/uL (ref 0.1–1.0)
Monocytes Relative: 10 %
Neutro Abs: 3.7 10*3/uL (ref 1.7–7.7)
Neutrophils Relative %: 55 %
Platelet Count: 548 10*3/uL — ABNORMAL HIGH (ref 150–400)
RBC: 7.32 MIL/uL — ABNORMAL HIGH (ref 4.22–5.81)
RDW: 18 % — ABNORMAL HIGH (ref 11.5–15.5)
WBC Count: 6.5 10*3/uL (ref 4.0–10.5)
nRBC: 0 % (ref 0.0–0.2)

## 2020-07-11 LAB — RETICULOCYTES
Immature Retic Fract: 11.4 % (ref 2.3–15.9)
RBC.: 7.3 MIL/uL — ABNORMAL HIGH (ref 4.22–5.81)
Retic Count, Absolute: 72.3 10*3/uL (ref 19.0–186.0)
Retic Ct Pct: 1 % (ref 0.4–3.1)

## 2020-07-11 LAB — SAVE SMEAR(SSMR), FOR PROVIDER SLIDE REVIEW

## 2020-07-11 LAB — LACTATE DEHYDROGENASE: LDH: 177 U/L (ref 98–192)

## 2020-07-11 LAB — FERRITIN: Ferritin: 21 ng/mL — ABNORMAL LOW (ref 24–336)

## 2020-07-11 NOTE — Telephone Encounter (Signed)
No 07/11/20 LOS.... AOM 

## 2020-07-11 NOTE — Progress Notes (Signed)
Hematology/Oncology Consultation   Name: [name]      MRN: [id number]    Location: Room/bed info not found  Date: 07/11/2020 Time:10:14 AM   REFERRING PHYSICIAN: Kathlene November, MD  REASON FOR CONSULT: Thrombocytosis, microcytosis    DIAGNOSIS: Lab work-up pending  HISTORY OF PRESENT ILLNESS: [name] is a very pleasant 69 yo African American gentleman with thrombocytosis and elevated Hct over the last several month. He has had microcytosis for years.  We have sent off a JAK-2 on him to determine if he has polycythemia vera.  He states that he has hot flashes and night sweats.  He also notes SOB with over exertion such as when he is push mowing his lawn.  He denies history of sleep apnea.  No sickle cell disease or trait that he is aware of.  No personal history of cancer. His father had leukemia.  He has not had any issues with frequent or recurrent infection.  No fever, chills, n/v, cough, rash, dizziness, chest pain, palpitations, abdominal pain or changes in bowel or bladder habits.  No episodes of bleeding. No abnormal bruising, no petechiae.  No swelling or tenderness in his extremities.  He does note neuropathy in his feet.  No thyroid disease.  He is diabetic currently on insulin and Metformin. His last Hgb A1c in July was 7.2.  He has maintained a good appetite and is staying well hydrated. His weight is stable at 181 lbs.  He quit smoking in 2012.  He has 1 glass of wine maybe once a month. No recreational drug use.  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  He is typically quite active and enjoys walking and doing yard work.   ROS: All other 10 point review of systems is negative.   PAST MEDICAL HISTORY:   Past Medical History:  Diagnosis Date  . Abnormal myocardial perfusion study 2006   EF 44% ? inferoseptal ischemia. no cath done  . Benign neoplasm of cecum   . Breast mass, right 03/2008   . Cardiomyopathy ([place]) 07/20/2018   Ejection fraction 4045% in September 2019  . Centrilobular emphysema ([place]) 08/25/2018   02/04/2018-high-res CT- no evidence of interstitial lung disease, severe centrilobular emphysema and mild diffuse bronchial wall thickening suggesting COPD, no calcified pleural plaques no pleural effusion, mild smooth bilateral pleural thickening which is nonspecific  02/04/2018-pulmonary function test- FVC 3.37 (71% predicted), postbronchodilator ratio 50, postbronchodilator FEV1 54, mid flow reve  . COLONIC POLYPS, HX OF 03/18/2007   Qualifier: Diagnosis of  By: [name]   . Contact dermatitis 01/31/2013  . COPD (chronic obstructive pulmonary disease) ([place])    "dx'd 12/2017"  . COPD GOLD II A  08/25/2018   02/04/2018-high-res CT- no evidence of interstitial lung disease, severe centrilobular emphysema and mild diffuse bronchial wall thickening suggesting COPD, no calcified pleural plaques no pleural effusion, mild smooth bilateral pleural thickening which is nonspecific  02/04/2018-pulmonary function test- FVC 3.37 (71% predicted), postbronchodilator ratio 50, postbronchodilator FEV1 54, mid flow reve  . Coronary artery disease   . Critical aortic valve stenosis 01/12/2018   Calculated valve area less than 0.5 cm mean gradient more than 60 mm of mercury  . DEGENERATIVE JOINT DISEASE, BACK 03/18/2007   Qualifier: Diagnosis of  By: [name]   . Depressed left ventricular ejection fraction 03/28/2020  . Dyspnea on exertion 01/12/2018  . ERECTILE DYSFUNCTION, ORGANIC 02/10/2009  Qualifier: Diagnosis of  By: [name]   . Heart murmur   . HEMORRHOIDS, INTERNAL 03/18/2007   Qualifier: Diagnosis of  By: [name]   . Hyperlipemia 10/21/2007   Qualifier: Diagnosis of  By: [name]   . Hypertension   . Hypoglycemia due to insulin 03/26/2020  . Low back pain   . MYOCARDIAL PERFUSION SCAN, WITH STRESS TEST, ABNORMAL 08/11/2008   Qualifier:  Diagnosis of  By: Johnsie Cancel, MD, Rona Ravens   . Nocturnal hypoxemia 08/25/2018   04/09/18 >>> ONO - telephone note>>> Patient's ONO on room air showed that he was less than 88% for more than an hour. Patient needs to be on 2 L at HS.  10/06/2018-overnight oximetry-entire duration was 4 hours and 23 minutes, SPO2 less than 88% for 25 minutes and 32 seconds    . Oxygen deficiency    uses 2L at night  . Paroxysmal atrial fibrillation ([place]) 10/12/2018  . S/P AVR 03/04/2018   AVR 23 mm Edwards Magna Valve - bioprostetic 03/04/2018 LIMA - LAD  . Status post coronary artery bypass graft 03/20/2018   LIMA to LAD during aortic valve replacement surgery 03/04/2018  . Type 2 diabetes mellitus, uncontrolled, with neuropathy ([place]) 05/20/2008   Qualifier: Diagnosis of  By: [name]     ALLERGIES: Allergies  Allergen Reactions  . Amlodipine Besylate Other (See Comments)    REACTION: ? caused left axillary nodules/chest flutter  . Hydrochlorothiazide Hives  . Lisinopril     Hyperkalemia       MEDICATIONS:  Current Outpatient Medications on File Prior to Visit  Medication Sig Dispense Refill  . acetaminophen (TYLENOL) 500 MG tablet Take 1,000 mg by mouth every 6 (six) hours as needed for moderate pain or headache.    Marland Kitchen aspirin EC 325 MG EC tablet Take 1 tablet (325 mg total) by mouth daily.  0  . cetirizine (ZYRTEC) 10 MG tablet Take 10 mg by mouth daily as needed for allergies.     . Cyanocobalamin (VITAMIN B-12 PO) Take 1 tablet by mouth daily.    Marland Kitchen glucose blood (PRODIGY NO CODING BLOOD GLUC) test strip Use as instructed to check blood sugar twice a day.  DX  E11.40 100 each 1  . hydrALAZINE (APRESOLINE) 10 MG tablet Take 1 tablet (10 mg total) by mouth 3 (three) times daily. 270 tablet 1  . insulin aspart (NOVOLOG FLEXPEN) 100 UNIT/ML FlexPen Inject 8 Units into the skin 2 (two) times daily with a meal.    . insulin detemir (LEVEMIR FLEXTOUCH) 100 UNIT/ML FlexPen Inject 26 Units into the  skin every morning.    . Insulin Pen Needle 32G X 4 MM MISC 1 Device by Does not apply route as directed. 100 each 6  . metFORMIN (GLUCOPHAGE) 1000 MG tablet Take 500 mg by mouth daily.     . metoprolol succinate (TOPROL-XL) 50 MG 24 hr tablet Take 25 mg by mouth daily. Take with or immediately following a meal.    . rosuvastatin (CRESTOR) 20 MG tablet Take 1 tablet (20 mg total) by mouth daily. 90 tablet 0  . sacubitril-valsartan (ENTRESTO) 24-26 MG Take 1 tablet by mouth 2 (two) times daily. ENTRESTO FREE MONTH CARD RXBIN [id number] RXGRP [id number] RXPCN [facility]  ID [id number] 60 tablet 3  . sacubitril-valsartan (ENTRESTO) 49-51 MG Take 1 tablet by mouth 2 (two) times daily. 60 tablet 3  . spironolactone (ALDACTONE) 25 MG tablet Take 0.5  tablets (12.5 mg total) by mouth daily. 45 tablet 3   No current facility-administered medications on file prior to visit.     PAST SURGICAL HISTORY Past Surgical History:  Procedure Laterality Date  . ABDOMINAL AORTOGRAM N/A 01/13/2018   Procedure: ABDOMINAL AORTOGRAM;  Surgeon: Troy Sine, MD;  Location: [facility];  Service: Cardiovascular;  Laterality: N/A;  . AORTIC VALVE REPLACEMENT N/A 03/04/2018   Procedure: AORTIC VALVE REPLACEMENT (AVR) 29mm Edwards Magna Ease Tissue Valve.;  Surgeon: Melrose Nakayama, MD;  Location: [facility];  Service: Open Heart Surgery;  Laterality: N/A;  . CARDIAC VALVE REPLACEMENT    . COLONOSCOPY W/ POLYPECTOMY    . COLONOSCOPY WITH PROPOFOL N/A 02/22/2020   Procedure: COLONOSCOPY WITH PROPOFOL;  Surgeon: Doran Stabler, MD;  Location: [facility];  Service: Gastroenterology;  Laterality: N/A;  . CORONARY ARTERY BYPASS GRAFT N/A 03/04/2018   Procedure: CORONARY ARTERY BYPASS GRAFTING (CABG) x1:  LIMA to LAD.;  Surgeon: Melrose Nakayama, MD;  Location: [facility];  Service: Open Heart Surgery;  Laterality: N/A;  . HEMOSTASIS CLIP PLACEMENT  02/22/2020   Procedure: HEMOSTASIS CLIP PLACEMENT;  Surgeon: Doran Stabler, MD;  Location: [facility];  Service: Gastroenterology;;  . Cruzita Lederer PRESSURE WIRE/FFR STUDY N/A 05/10/2020   Procedure: INTRAVASCULAR PRESSURE WIRE/FFR STUDY;  Surgeon: Sherren Mocha, MD;  Location: [facility];  Service: Cardiovascular;  Laterality: N/A;  . LEFT HEART CATH AND CORS/GRAFTS ANGIOGRAPHY N/A 05/10/2020   Procedure: LEFT HEART CATH AND CORS/GRAFTS ANGIOGRAPHY;  Surgeon: Sherren Mocha, MD;  Location: [facility];  Service: Cardiovascular;  Laterality: N/A;  . LIPOMA EXCISION Left 07/2008    "back of shoulder" Dr Gershon Crane  . POLYPECTOMY  02/22/2020   Procedure: POLYPECTOMY;  Surgeon: Doran Stabler, MD;  Location: [facility];  Service: Gastroenterology;;  . RIGHT/LEFT HEART CATH AND CORONARY ANGIOGRAPHY N/A 01/13/2018   Procedure: RIGHT/LEFT HEART CATH AND CORONARY ANGIOGRAPHY;  Surgeon: Troy Sine, MD;  Location: [facility];  Service: Cardiovascular;  Laterality: N/A;  . TEE WITHOUT CARDIOVERSION N/A 03/04/2018   Procedure: TRANSESOPHAGEAL ECHOCARDIOGRAM (TEE);  Surgeon: Melrose Nakayama, MD;  Location: [facility];  Service: Open Heart Surgery;  Laterality: N/A;  . TONSILLECTOMY    . WISDOM TOOTH EXTRACTION      FAMILY HISTORY: Family History  Problem Relation Age of Onset  . Melanoma Father 109       deceased secondary to melanoma  . Alzheimer's disease Father   . Diabetes Father   . Hypertension Mother        alive -58  . Colon cancer Neg Hx   . Esophageal cancer Neg Hx   . Colon polyps Neg Hx   . Stomach cancer Neg Hx     SOCIAL HISTORY:  reports that he quit smoking about 9 years ago. His smoking use included cigarettes. He has a 61.50 pack-year smoking history. He has never used smokeless tobacco. He reports current alcohol use. He reports previous drug use.  PERFORMANCE STATUS: The patient's performance status is 1 - Symptomatic but completely ambulatory  PHYSICAL EXAM: Most Recent Vital Signs: There were no vitals taken  for this visit. BP (!) 153/99 (BP Location: Left Arm, Patient Position: Sitting)   Pulse 85   Temp 97.9 F (36.6 C) (Oral)   Resp 17   Ht 6\' 4"  (1.93 m)   Wt 181 lb 1.9 oz (82.2 kg)   SpO2 95%   BMI  22.05 kg/m   General Appearance:    Alert, cooperative, no distress, appears stated age  Head:    Normocephalic, without obvious abnormality, atraumatic  Eyes:    PERRL, conjunctiva/corneas clear, EOM's intact, fundi    benign, both eyes             Throat:   Lips, mucosa, and tongue normal; teeth and gums normal  Neck:   Supple, symmetrical, trachea midline, no adenopathy;       thyroid:  No enlargement/tenderness/nodules; no carotid   bruit or JVD  Back:     Symmetric, no curvature, ROM normal, no CVA tenderness  Lungs:     Clear to auscultation bilaterally, respirations unlabored  Chest wall:    No tenderness or deformity  Heart:    Regular rate and rhythm, S1 and S2 normal, no murmur, rub   or gallop  Abdomen:     Soft, non-tender, bowel sounds active all four quadrants,    no masses, no organomegaly        Extremities:   Extremities normal, atraumatic, no cyanosis or edema  Pulses:   2+ and symmetric all extremities  Skin:   Skin color, texture, turgor normal, no rashes or lesions  Lymph nodes:   Cervical, supraclavicular, and axillary nodes normal  Neurologic:   CNII-XII intact. Normal strength, sensation and reflexes      throughout    LABORATORY DATA:  Results for orders placed or performed in visit on 07/11/20 (from the past 48 hour(s))  CBC with Differential ([place] Only)     Status: Abnormal   Collection Time: 07/11/20  9:32 AM  Result Value Ref Range   WBC Count 6.5 4.0 - 10.5 K/uL   RBC 7.32 (H) 4.22 - 5.81 MIL/uL   Hemoglobin 16.3 13.0 - 17.0 g/dL   HCT 53.7 (H) 39 - 52 %   MCV 73.4 (L) 80.0 - 100.0 fL   MCH 22.3 (L) 26.0 - 34.0 pg   MCHC 30.4 30.0 - 36.0 g/dL   RDW 18.0 (H) 11.5 - 15.5 %   Platelet Count 548 (H) 150 - 400 K/uL   nRBC 0.0 0.0 - 0.2  %   Neutrophils Relative % 55 %   Neutro Abs 3.7 1.7 - 7.7 K/uL   Lymphocytes Relative 31 %   Lymphs Abs 2.0 0.7 - 4.0 K/uL   Monocytes Relative 10 %   Monocytes Absolute 0.6 0.1 - 1.0 K/uL   Eosinophils Relative 2 %   Eosinophils Absolute 0.1 0.0 - 0.5 K/uL   Basophils Relative 1 %   Basophils Absolute 0.0 0.0 - 0.1 K/uL   Immature Granulocytes 1 %   Abs Immature Granulocytes 0.04 0.00 - 0.07 K/uL    Comment: Performed at [facility] at [facility], [address] [place], [place] [address]  CMP ([place] only)     Status: Abnormal   Collection Time: 07/11/20  9:32 AM  Result Value Ref Range   Sodium 141 135 - 145 mmol/L   Potassium 4.1 3.5 - 5.1 mmol/L   Chloride 105 98 - 111 mmol/L   CO2 30 22 - 32 mmol/L   Glucose, Bld 102 (H) 70 - 99 mg/dL    Comment: Glucose reference range applies only to samples taken after fasting for at least 8 hours.   BUN 14 8 - 23 mg/dL   Creatinine 0.94 0.61 - 1.24 mg/dL   Calcium 10.0 8.9 - 10.3 mg/dL  Total Protein 7.3 6.5 - 8.1 g/dL   Albumin 4.3 3.5 - 5.0 g/dL   AST 15 15 - 41 U/L   ALT 9 0 - 44 U/L   Alkaline Phosphatase 47 38 - 126 U/L   Total Bilirubin 0.5 0.3 - 1.2 mg/dL   GFR, Estimated >60 >60 mL/min    Comment: (NOTE) Calculated using the CKD-EPI Creatinine Equation (2021)    Anion gap 6 5 - 15    Comment: Performed at [facility] at [facility], [address] [place], [place] [address]  [facility])     Status: None   Collection Time: 07/11/20  9:32 AM  Result Value Ref Range   Smear Review SMEAR STAINED AND AVAILABLE FOR REVIEW     Comment: Performed at [facility] at [facility], [address] [place], [place] [address]  Reticulocytes     Status: Abnormal   Collection Time: 07/11/20  9:33 AM  Result Value Ref Range   Retic Ct Pct 1.0 0.4 - 3.1 %   RBC. 7.30 (H) 4.22 - 5.81 MIL/uL   Retic Count, Absolute 72.3 19.0 - 186.0 K/uL    Immature Retic Fract 11.4 2.3 - 15.9 %    Comment: Performed at [facility] at [facility], [address] [place], [place] [address]      RADIOGRAPHY: No results found.     PATHOLOGY: None   ASSESSMENT/PLAN: Mr. Chizmar is a very pleasant 69 yo African American gentleman with new thrombocytosis and elevated Hct over the last several months as well as microcytosis for years.  His elevated Hct and thrombocytosis are concerning for polycythemia vera.  He is symptomatic with hot flashes, night sweats and SOB with over exertion.  Jak-2 is pending. If positive, we will discuss treatment such as Hydrea.  We will go ahead and plan to follow-up in 6 weeks. We will call and discuss his lab results once everything is available  All questions were answered and he is in agreement with the plan. He was encouraged to contact our office with any questions or concerns. We can certainly see him sooner if needed.    Laverna Peace, NP

## 2020-07-11 NOTE — Telephone Encounter (Signed)
Received fax from [place] approving request for Entresto 24-26MG  tabs. PA good from 07/05/2020 to 07/05/2021

## 2020-07-13 ENCOUNTER — Telehealth: Payer: Self-pay | Admitting: *Deleted

## 2020-07-13 ENCOUNTER — Telehealth: Payer: Self-pay

## 2020-07-13 NOTE — Telephone Encounter (Signed)
Received phone call from [facility] with [facility] regarding Jak 2 testing.  Updated CPT codes given and more extensive labwork faxed

## 2020-07-13 NOTE — Telephone Encounter (Signed)
Called pt with 07/2020 appts and mobile has a full mailbox, home number has a call blocker-calendar mailed to pt... AOM

## 2020-07-14 ENCOUNTER — Telehealth: Payer: Self-pay

## 2020-07-14 NOTE — Telephone Encounter (Signed)
Returned call to wife on pt mobile per secure chat message, she is aware of the appts and has asked that we call with any lab results today-[place] aware... AOM

## 2020-07-18 NOTE — Progress Notes (Addendum)
Referring Physician: Dr. Harriet Masson Primary Care: Colon Branch, MD Primary Cardiologist: Dr. Harriet Masson HF Cardiologist: Dr. Haroldine Laws  HPI:  [name] a 69 y.o.maleformer smoker (49 py, quit 2012) with COPD, non-obstructive CAD, severe aortic stenosis s/p bioprosthetic AVR/CABG (LIMA to D1 -> LAD)  in 2019, non-ischemic cardiomyopathy most recent EF on March 27, 2020 was 25 to 30%, OSA, hypertension hyperlipidemia and diabetes referred by Dr. Harriet Masson for further evaluation of his HF.  Echo at time of AVR in 2019 EF 35-40%  Underwent LHC in 9/21 for worsening fatigue and DOE.  LM ok LAD 65% LCX 30% Ramus 65% RCA 20%  Patent but atretic LIMA graft LVEDP 7  He recently presented for follow up visit on 07/06/20. 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 Fairly active around the house but had been slowing down down some (now takes 2 days to cut the grass instead of 1). Could walk around the store without stopping. Stated he gets his exercise in there. No CP. No edema, orthopnea or PND. Rare dizziness when standing. Reported taking his BP regularly. Usually 140/80s. Not wearing CPAP.  Today he returns to HF clinic for pharmacist medication titration. At last visit with MD, Delene Loll was increased to 49/51 mg BID. Additionally, Farxiga 5 mg daily was restarted at his endocrinology appointment on 07/25/20. Overall he is feeling well today. No dizziness, lightheadedness, chest pain or palpitations. His breathing has been good. Can walk 0.25 miles before becoming SOB. His weight has been stable. He does not take any diuretic. No LEE, PND or orthopnea. Taking all medications as prescribed and tolerating all medications.    HF Medications: Metoprolol succinate 25 mg daily Entresto 49/51 mg BID Spironolactone 12.5 mg daily Farxiga 5 mg daily Hydralazine 10 mg TID  Has the patient been experiencing any side effects to the medications prescribed?  no  Does the patient have any problems  obtaining medications due to transportation or finances?   No; has Medicare A/B and [place].  Understanding of regimen: good Understanding of indications: good Potential of compliance: good Patient understands to avoid NSAIDs. Patient understands to avoid decongestants.    Pertinent Lab Values (07/11/20): Marland Kitchen Serum creatinine 0.94, BUN 14, Potassium 4.1, Sodium 141  Vital Signs: . Weight: 179.4 lbs (last clinic weight: 179.6 lbs) . Blood pressure: 114/66  . Heart rate: 83   Assessment: 1. Chronic systolic HF - primarily NICM. Had LIMA to LAD at time of CABG 2019 but on cath in 9/21 LAD lesion 65% and LIMA atretic - echo 2019 EF 35-40% - Echo 8/21 EF 25-30% - Doing well NYHA II - Volume status ok. Recent cath with LVEDP 7 - Increase metoprolol succinate to 50 mg daily - Continue Entresto 49/51 mg BID - Continue spironolactone 12.5 daily - Continue Farxiga 5 mg daily. Recently initiated by endocrinologist.  - Continue hydralazine 10 mg TID - Plan cMRI t further evaluate etiology of CM - If becomes more symptomatic will need CPX to assess lung vs cardiac limitation  - Discussed possible referral for CID based on EF by cMRI  2. Severe AS - s/p bioprosthetic AVR 2019 - recent cath with no gradient - reinforced need for SBE prophylaxis  3. CAD - Had LIMA to LAD at time of CABG 2019 but on cath in 9/21 LAD lesion 65% and LIMA atretic - Continue ASA/statin  4. COPD, Gold II - followed by Pulmonary  - wears 2L O2 at night    Plan:  1) Medication changes: Based on clinical presentation, vital signs and recent labs will increase metoprolol succinate to 50 mg daily.  3) Follow-up: 3 weeks with Pharmacy Clinic   Audry Riles, PharmD, BCPS, BCCP, CPP Heart Failure Clinic Pharmacist [phone]

## 2020-07-24 NOTE — Progress Notes (Signed)
Name: [name]  Age/ Sex: 69 y.o., male   MRN/ DOB: [id number], November 08, 1950     PCP: Colon Branch, MD   Reason for Endocrinology Evaluation: Type 2 Diabetes Mellitus  Initial Endocrine Consultative Visit: 04/19/2020    PATIENT IDENTIFIER: [name] is a 69 y.o. male with a past medical history of COPD, non-obstructive CAD, severe AS ( S/P AVR/CABG), CHF, HTN , T2DM and OSA. The patient has followed with Endocrinology clinic since 04/19/2020 for consultative assistance with management of his diabetes.  DIABETIC HISTORY:  [name] was diagnosed with DM in 2009. He has been on metformin and insulin for years.Metfromin dose was reduced by 50% due to hypoglycemia per pt.  His hemoglobin A1c has ranged from 7.2% in 2021, peaking at 8.8%in 2018  On his initial visit to our clinic his A1c was 7.2 % We adjusted MDI regimen and continued metformin   Due to a low BMI of 20.99 we checked GAD-65 and islet cell Ab's which were negative     SUBJECTIVE:   During the last visit (04/19/2020): A1c 7.2 % adjusted MDI regimen and continued metformin     Today (07/25/2020): [name] is here for a follow up on diabetes. He is accompanied by wife [name].  He checks his blood sugars a few times daily. The patient has not  had hypoglycemic episodes since the last clinic visit.  He is S/P cardiac cath with  in 04/2020    HOME DIABETES REGIMEN:  Levemir  20 units daily  Novolog 8 units with each meal  Metformin 1000 mg , HALF a tablet daily     Statin: yes ACE-I/ARB: yes   METER DOWNLOAD SUMMARY:  7 day average 135 mg/dL   108- 199 mg/dL   DIABETIC COMPLICATIONS: Microvascular complications:   Denies: CKD, retinopathy , neuropathy   Last eye exam: Completed 08/2019  Macrovascular complications:   CAD  Denies: PVD, CVA   HISTORY:  Past Medical History:  Past Medical History:  Diagnosis Date  . Abnormal myocardial perfusion study 2006   EF 44% ?  inferoseptal ischemia. no cath done  . Benign neoplasm of cecum   . Breast mass, right 03/2008  . Cardiomyopathy ([place]) 07/20/2018   Ejection fraction 4045% in September 2019  . Centrilobular emphysema ([place]) 08/25/2018   02/04/2018-high-res CT- no evidence of interstitial lung disease, severe centrilobular emphysema and mild diffuse bronchial wall thickening suggesting COPD, no calcified pleural plaques no pleural effusion, mild smooth bilateral pleural thickening which is nonspecific  02/04/2018-pulmonary function test- FVC 3.37 (71% predicted), postbronchodilator ratio 50, postbronchodilator FEV1 54, mid flow reve  . COLONIC POLYPS, HX OF 03/18/2007   Qualifier: Diagnosis of  By: [name]   . Contact dermatitis 01/31/2013  . COPD (chronic obstructive pulmonary disease) ([place])    "dx'd 12/2017"  . COPD GOLD II A  08/25/2018   02/04/2018-high-res CT- no evidence of interstitial lung disease, severe centrilobular emphysema and mild diffuse bronchial wall thickening suggesting COPD, no calcified pleural plaques no pleural effusion, mild smooth bilateral pleural thickening which is nonspecific  02/04/2018-pulmonary function test- FVC 3.37 (71% predicted), postbronchodilator ratio 50, postbronchodilator FEV1 54, mid flow reve  . Coronary artery disease   . Critical aortic valve stenosis 01/12/2018   Calculated valve area less than 0.5 cm mean gradient more than 60 mm of mercury  . DEGENERATIVE JOINT DISEASE, BACK 03/18/2007   Qualifier: Diagnosis of  By: [name]   .  Depressed left ventricular ejection fraction 03/28/2020  . Dyspnea on exertion 01/12/2018  . ERECTILE DYSFUNCTION, ORGANIC 02/10/2009   Qualifier: Diagnosis of  By: [name]   . Heart murmur   . HEMORRHOIDS, INTERNAL 03/18/2007   Qualifier: Diagnosis of  By: [name]   . Hyperlipemia 10/21/2007   Qualifier: Diagnosis of  By: [name]   . Hypertension   . Hypoglycemia due to insulin 03/26/2020  . Low  back pain   . MYOCARDIAL PERFUSION SCAN, WITH STRESS TEST, ABNORMAL 08/11/2008   Qualifier: Diagnosis of  By: Johnsie Cancel, MD, Rona Ravens   . Nocturnal hypoxemia 08/25/2018   04/09/18 >>> ONO - telephone note>>> Patient's ONO on room air showed that he was less than 88% for more than an hour. Patient needs to be on 2 L at HS.  10/06/2018-overnight oximetry-entire duration was 4 hours and 23 minutes, SPO2 less than 88% for 25 minutes and 32 seconds    . Oxygen deficiency    uses 2L at night  . Paroxysmal atrial fibrillation ([place]) 10/12/2018  . S/P AVR 03/04/2018   AVR 23 mm Edwards Magna Valve - bioprostetic 03/04/2018 LIMA - LAD  . Status post coronary artery bypass graft 03/20/2018   LIMA to LAD during aortic valve replacement surgery 03/04/2018  . Type 2 diabetes mellitus, uncontrolled, with neuropathy ([place]) 05/20/2008   Qualifier: Diagnosis of  By: [name]    Past Surgical History:  Past Surgical History:  Procedure Laterality Date  . ABDOMINAL AORTOGRAM N/A 01/13/2018   Procedure: ABDOMINAL AORTOGRAM;  Surgeon: Troy Sine, MD;  Location: [facility];  Service: Cardiovascular;  Laterality: N/A;  . AORTIC VALVE REPLACEMENT N/A 03/04/2018   Procedure: AORTIC VALVE REPLACEMENT (AVR) 68mm Edwards Magna Ease Tissue Valve.;  Surgeon: Melrose Nakayama, MD;  Location: [facility];  Service: Open Heart Surgery;  Laterality: N/A;  . CARDIAC VALVE REPLACEMENT    . COLONOSCOPY W/ POLYPECTOMY    . COLONOSCOPY WITH PROPOFOL N/A 02/22/2020   Procedure: COLONOSCOPY WITH PROPOFOL;  Surgeon: Doran Stabler, MD;  Location: [facility];  Service: Gastroenterology;  Laterality: N/A;  . CORONARY ARTERY BYPASS GRAFT N/A 03/04/2018   Procedure: CORONARY ARTERY BYPASS GRAFTING (CABG) x1:  LIMA to LAD.;  Surgeon: Melrose Nakayama, MD;  Location: [facility];  Service: Open Heart Surgery;  Laterality: N/A;  . HEMOSTASIS CLIP PLACEMENT  02/22/2020   Procedure: HEMOSTASIS CLIP PLACEMENT;  Surgeon:  Doran Stabler, MD;  Location: [facility];  Service: Gastroenterology;;  . Cruzita Lederer PRESSURE WIRE/FFR STUDY N/A 05/10/2020   Procedure: INTRAVASCULAR PRESSURE WIRE/FFR STUDY;  Surgeon: Sherren Mocha, MD;  Location: [facility];  Service: Cardiovascular;  Laterality: N/A;  . LEFT HEART CATH AND CORS/GRAFTS ANGIOGRAPHY N/A 05/10/2020   Procedure: LEFT HEART CATH AND CORS/GRAFTS ANGIOGRAPHY;  Surgeon: Sherren Mocha, MD;  Location: [facility];  Service: Cardiovascular;  Laterality: N/A;  . LIPOMA EXCISION Left 07/2008    "back of shoulder" Dr Gershon Crane  . POLYPECTOMY  02/22/2020   Procedure: POLYPECTOMY;  Surgeon: Doran Stabler, MD;  Location: [facility];  Service: Gastroenterology;;  . RIGHT/LEFT HEART CATH AND CORONARY ANGIOGRAPHY N/A 01/13/2018   Procedure: RIGHT/LEFT HEART CATH AND CORONARY ANGIOGRAPHY;  Surgeon: Troy Sine, MD;  Location: [facility];  Service: Cardiovascular;  Laterality: N/A;  . TEE WITHOUT CARDIOVERSION N/A 03/04/2018   Procedure: TRANSESOPHAGEAL ECHOCARDIOGRAM (TEE);  Surgeon: Modesto Charon  C, MD;  Location: [facility];  Service: Open Heart Surgery;  Laterality: N/A;  . TONSILLECTOMY    . WISDOM TOOTH EXTRACTION      Social History:  reports that he quit smoking about 9 years ago. His smoking use included cigarettes. He has a 61.50 pack-year smoking history. He has never used smokeless tobacco. He reports current alcohol use. He reports previous drug use. Family History:  Family History  Problem Relation Age of Onset  . Melanoma Father 43       deceased secondary to melanoma  . Alzheimer's disease Father   . Diabetes Father   . Hypertension Mother        alive -76  . Colon cancer Neg Hx   . Esophageal cancer Neg Hx   . Colon polyps Neg Hx   . Stomach cancer Neg Hx      HOME MEDICATIONS: Allergies as of 07/25/2020      Reactions   Amlodipine Besylate Other (See Comments)   REACTION: ? caused left axillary  nodules/chest flutter   Hydrochlorothiazide Hives   Lisinopril    Hyperkalemia      Medication List       Accurate as of July 25, 2020  9:18 AM. If you have any questions, ask your nurse or doctor.        acetaminophen 500 MG tablet Commonly known as: TYLENOL Take 1,000 mg by mouth every 6 (six) hours as needed for moderate pain or headache.   aspirin 325 MG EC tablet Take 1 tablet (325 mg total) by mouth daily.   cetirizine 10 MG tablet Commonly known as: ZYRTEC Take 10 mg by mouth daily as needed for allergies.   Entresto 49-51 MG Generic drug: sacubitril-valsartan Take 1 tablet by mouth 2 (two) times daily. What changed: Another medication with the same name was removed. Continue taking this medication, and follow the directions you see here. Changed by: Dorita Sciara, MD   glucose blood test strip Commonly known as: Prodigy No Coding Blood Gluc Use as instructed to check blood sugar twice a day.  DX  E11.40   hydrALAZINE 10 MG tablet Commonly known as: APRESOLINE Take 1 tablet (10 mg total) by mouth 3 (three) times daily.   Insulin Pen Needle 32G X 4 MM Misc 1 Device by Does not apply route as directed.   Levemir FlexTouch 100 UNIT/ML FlexPen Generic drug: insulin detemir Inject 26 Units into the skin every morning.   metFORMIN 1000 MG tablet Commonly known as: GLUCOPHAGE Take 500 mg by mouth daily.   metoprolol succinate 50 MG 24 hr tablet Commonly known as: TOPROL-XL Take 25 mg by mouth daily. Take with or immediately following a meal.   NovoLOG FlexPen 100 UNIT/ML FlexPen Generic drug: insulin aspart Inject 8 Units into the skin 2 (two) times daily with a meal.   rosuvastatin 20 MG tablet Commonly known as: CRESTOR Take 1 tablet (20 mg total) by mouth daily.   spironolactone 25 MG tablet Commonly known as: Aldactone Take 0.5 tablets (12.5 mg total) by mouth daily.   VITAMIN B-12 PO Take 1 tablet by mouth daily.         OBJECTIVE:   Vital Signs: BP 134/84   Pulse 86   Ht 6\' 4"  (1.93 m)   Wt 180 lb 4 oz (81.8 kg)   SpO2 98%   BMI 21.94 kg/m   Wt Readings from Last 3 Encounters:  07/25/20 180 lb 4 oz (81.8 kg)  07/11/20 181  lb 1.9 oz (82.2 kg)  07/06/20 179 lb 9.6 oz (81.5 kg)     Exam: General: Pt appears well and is in NAD  Lungs: Clear with good BS bilat with no rales, rhonchi, or wheezes  Heart: RRR   Abdomen: Normoactive bowel sounds, soft, nontender, without masses or organomegaly palpable  Extremities: No pretibial edema.   Neuro: MS is good with appropriate affect, pt is alert and Ox3          DATA REVIEWED: Results for RAYGEN, DAHM (MRN [id number]) as of 07/25/2020 09:06  Ref. Range 07/11/2020 09:32  Sodium Latest Ref Range: 135 - 145 mmol/L 141  Potassium Latest Ref Range: 3.5 - 5.1 mmol/L 4.1  Chloride Latest Ref Range: 98 - 111 mmol/L 105  CO2 Latest Ref Range: 22 - 32 mmol/L 30  Glucose Latest Ref Range: 70 - 99 mg/dL 102 (H)  BUN Latest Ref Range: 8 - 23 mg/dL 14  Creatinine Latest Ref Range: 0.61 - 1.24 mg/dL 0.94  Calcium Latest Ref Range: 8.9 - 10.3 mg/dL 10.0  Anion gap Latest Ref Range: 5 - 15  6  Alkaline Phosphatase Latest Ref Range: 38 - 126 U/L 47  Albumin Latest Ref Range: 3.5 - 5.0 g/dL 4.3  AST Latest Ref Range: 15 - 41 U/L 15  ALT Latest Ref Range: 0 - 44 U/L 9  Total Protein Latest Ref Range: 6.5 - 8.1 g/dL 7.3  Total Bilirubin Latest Ref Range: 0.3 - 1.2 mg/dL 0.5  GFR, Est Non African American Latest Ref Range: >60 mL/min >60      Lab Results  Component Value Date   HGBA1C 7.5 (A) 07/25/2020   HGBA1C 7.2 (H) 03/23/2020   HGBA1C 7.4 (H) 01/06/2020   Lab Results  Component Value Date   MICROALBUR 0.9 04/19/2020   LDLCALC 64 03/23/2020   CREATININE 0.94 07/11/2020   Lab Results  Component Value Date   MICRALBCREAT 7 04/19/2020     Lab Results  Component Value Date   CHOL 127 03/23/2020   HDL 50.00 03/23/2020   LDLCALC 64  03/23/2020   LDLDIRECT 177.6 10/07/2007   TRIG 66.0 03/23/2020   CHOLHDL 3 03/23/2020       ISLET CELL ANTIBODY SCREEN NEGATIVE NEGATIVE    Glutamic Acid Decarb Ab <5 IU/mL <5      ASSESSMENT / PLAN / RECOMMENDATIONS:   1) Type 2 Diabetes Mellitus, Sub-Optimally controlled, With macrovascular  complications - Most recent A1c of 7.5 %. Goal A1c < 7.0 %.     - A1c slightly up at 7.5 % . His 7 days glucose average on meter was 135 mg/dL.   - He had an ED visit in 03/2020 with Hypoglycemia ( BG 40 mg/dl) at the time he was on Farxiga which was stopped and his Metformin reduced from 1000 to 500 mg daily.   - Today we discussed increasing Metformin back to 1000 mg. Wife reluctant . We also discussed restarting faxiga as he is a great candidate for it due to CAD history.  - I explained the cardiovascular benefits as well as renal benefits to SGLT-2 inhibitors , I also explained the cardiovascular benefits of metformin and our long term goal would be to avoid hypoglycemia and reduce amount of insulin needed in the hopes of weaning off prandial insulin.  - I am going to preemptively reduce his insulin dose as below  - Pt to notify the office should his BG's remain consistently below 100 mg/dL  MEDICATIONS: - Restart Farxiga 5 mg , 1 tablet with Breakfast  - Increase Metformin 1000 mg, 1 tablet daily  - Decrease Levemir to 18 units daily  - Novolog 6 units with each meal    EDUCATION / INSTRUCTIONS:  BG monitoring instructions: Patient is instructed to check his blood sugars 3 times a day, before meals  . Call [facility] if: BG persistently < 100  . I reviewed the Rule of 15 for the treatment of hypoglycemia in detail with the patient. Literature supplied.     2) Diabetic complications:   Eye: Does not have known diabetic retinopathy.   Neuro/ Feet: Does not have known diabetic peripheral neuropathy .   Renal: Patient does not have known baseline CKD. He   is   on an ACEI/ARB at present.   F/U in 3 months   I spent 30 minutes preparing to see the patient by review of recent labs, and procedures, obtaining and reviewing separately obtained history, communicating with the patient/family or caregiver, ordering medications, tests or procedures, and documenting clinical information in the EHR including the differential Dx, treatment, and any further evaluation and other management   Signed electronically by: Mack Guise, MD  [facility]  [facility] [address] [place], [place] [address] Phone: [phone] FAX: [phone]   CC: Colon Branch, MD [address] [place] [address] Phone: [phone]  Fax: [phone]  Return to Endocrinology clinic as below: Future Appointments  Date Time Provider [place]  07/25/2020  9:30 AM Deziah Renwick, Melanie Crazier, MD [facility] [place]  07/31/2020  2:00 PM [facility] PHARMACY [facility] None  08/07/2020  8:00 AM [facility]MR 1 [facility]  08/14/2020  9:20 AM Tobb, Kardie, DO [facility] None  08/24/2020  9:30 AM [facility] LAB [facility] None  08/24/2020 10:00 AM Volanda Napoleon, MD [facility] None  09/05/2020 12:00 PM Bensimhon, Shaune Pascal, MD [facility] None  09/13/2020 10:20 AM Colon Branch, MD [facility]

## 2020-07-25 ENCOUNTER — Encounter: Payer: Self-pay | Admitting: Internal Medicine

## 2020-07-25 ENCOUNTER — Other Ambulatory Visit: Payer: Self-pay

## 2020-07-25 ENCOUNTER — Ambulatory Visit (INDEPENDENT_AMBULATORY_CARE_PROVIDER_SITE_OTHER): Payer: Medicare Other | Admitting: Internal Medicine

## 2020-07-25 VITALS — BP 134/84 | HR 86 | Ht 76.0 in | Wt 180.2 lb

## 2020-07-25 DIAGNOSIS — E119 Type 2 diabetes mellitus without complications: Secondary | ICD-10-CM

## 2020-07-25 DIAGNOSIS — E1165 Type 2 diabetes mellitus with hyperglycemia: Secondary | ICD-10-CM

## 2020-07-25 DIAGNOSIS — E1159 Type 2 diabetes mellitus with other circulatory complications: Secondary | ICD-10-CM | POA: Diagnosis not present

## 2020-07-25 DIAGNOSIS — Z794 Long term (current) use of insulin: Secondary | ICD-10-CM | POA: Diagnosis not present

## 2020-07-25 HISTORY — DX: Type 2 diabetes mellitus without complications: E11.9

## 2020-07-25 LAB — POCT GLYCOSYLATED HEMOGLOBIN (HGB A1C): Hemoglobin A1C: 7.5 % — AB (ref 4.0–5.6)

## 2020-07-25 LAB — JAK 2 V617F (GENPATH)

## 2020-07-25 MED ORDER — DAPAGLIFLOZIN PROPANEDIOL 5 MG PO TABS: 5.0000 mg | ORAL_TABLET | Freq: Every day | ORAL | 6 refills | Status: DC

## 2020-07-25 MED ORDER — METFORMIN HCL 1000 MG PO TABS
1000.0000 mg | ORAL_TABLET | Freq: Every day | ORAL | 1 refills | Status: DC
Start: 2020-07-25 — End: 2020-10-31

## 2020-07-25 MED ORDER — NOVOLOG FLEXPEN 100 UNIT/ML ~~LOC~~ SOPN
6.0000 [IU] | PEN_INJECTOR | Freq: Three times a day (TID) | SUBCUTANEOUS | 6 refills | Status: DC
Start: 2020-07-25 — End: 2021-02-13

## 2020-07-25 MED ORDER — LEVEMIR FLEXTOUCH 100 UNIT/ML ~~LOC~~ SOPN: 18.0000 [IU] | PEN_INJECTOR | Freq: Every morning | SUBCUTANEOUS | 6 refills | Status: DC

## 2020-07-25 NOTE — Patient Instructions (Signed)
-   Restart Farxiga 5 mg , 1 tablet with Breakfast  - Increase Metformin 1000 mg, 1 tablet daily  - Decrease Levemir to 18 units daily  - Novolog 6 units with each meal     HOW TO TREAT LOW BLOOD SUGARS (Blood sugar LESS THAN 70 MG/DL)  Please follow the RULE OF 15 for the treatment of hypoglycemia treatment (when your (blood sugars are less than 70 mg/dL)    STEP 1: Take 15 grams of carbohydrates when your blood sugar is low, which includes:   3-4 GLUCOSE TABS  OR  3-4 OZ OF JUICE OR REGULAR SODA OR  ONE TUBE OF GLUCOSE GEL     STEP 2: RECHECK blood sugar in 15 MINUTES STEP 3: If your blood sugar is still low at the 15 minute recheck --> then, go back to STEP 1 and treat AGAIN with another 15 grams of carbohydrates.

## 2020-07-26 ENCOUNTER — Telehealth: Payer: Self-pay | Admitting: Family

## 2020-07-26 ENCOUNTER — Telehealth: Payer: Self-pay

## 2020-07-26 NOTE — Telephone Encounter (Signed)
I was able to speak with both [name] and his wife over the phone. We discussed his recent lab wok and JAK-2 diagnosis.  He is already taking a full [name] aspirin daily. We will phlebotomize him weekly for 3 weeks and then see him back for follow-up on 12/30 per Dr. Marin Olp. No medication changes at this time.  They verbalized understanding and agreement with the plan. We did discuss phlebotomies and he will eat  And drink 1-2 bottles of water prior to. He is also on several antihypertensives. He will consider holding prior to AM phlebotomies and taking once he gets home to avoid symptomatic hypotension with fluid volume loss after phlebotomy. No other questions at this time. They were appreciative of the call.   Scheduling message sent.

## 2020-07-26 NOTE — Telephone Encounter (Signed)
phlebot appts made per 07/26/20 inbasket message... AOM

## 2020-07-28 ENCOUNTER — Inpatient Hospital Stay: Payer: Medicare Other | Attending: Hematology & Oncology

## 2020-07-28 ENCOUNTER — Other Ambulatory Visit: Payer: Self-pay

## 2020-07-28 DIAGNOSIS — D75839 Thrombocytosis, unspecified: Secondary | ICD-10-CM | POA: Diagnosis not present

## 2020-07-28 DIAGNOSIS — E119 Type 2 diabetes mellitus without complications: Secondary | ICD-10-CM | POA: Insufficient documentation

## 2020-07-28 DIAGNOSIS — Z794 Long term (current) use of insulin: Secondary | ICD-10-CM | POA: Insufficient documentation

## 2020-07-28 DIAGNOSIS — Z79899 Other long term (current) drug therapy: Secondary | ICD-10-CM | POA: Insufficient documentation

## 2020-07-28 DIAGNOSIS — D45 Polycythemia vera: Secondary | ICD-10-CM | POA: Diagnosis not present

## 2020-07-28 DIAGNOSIS — Z87891 Personal history of nicotine dependence: Secondary | ICD-10-CM | POA: Insufficient documentation

## 2020-07-28 DIAGNOSIS — Z7982 Long term (current) use of aspirin: Secondary | ICD-10-CM | POA: Diagnosis not present

## 2020-07-28 NOTE — Progress Notes (Signed)
[name] presents today for phlebotomy per MD orders. Phlebotomy procedure started at 1550 and ended at 1600. 536grams removed. Patient observed for 30 minutes after procedure without any incident. Patient tolerated procedure well. IV needle removed intact.  Pt discharged in no apparent distress. Pt left ambulatory without assistance.  Pt aware of discharge instructions and verbalized understanding and had no further questions.

## 2020-07-31 ENCOUNTER — Other Ambulatory Visit: Payer: Self-pay

## 2020-07-31 ENCOUNTER — Ambulatory Visit (HOSPITAL_COMMUNITY)
Admission: RE | Admit: 2020-07-31 | Discharge: 2020-07-31 | Disposition: A | Discharge status: Home / Self Care | Payer: Medicare Other | Source: Ambulatory Visit | Attending: Internal Medicine | Admitting: Internal Medicine

## 2020-07-31 VITALS — BP 114/66 | HR 83 | Wt 179.4 lb

## 2020-07-31 DIAGNOSIS — E785 Hyperlipidemia, unspecified: Secondary | ICD-10-CM | POA: Diagnosis not present

## 2020-07-31 DIAGNOSIS — Z9981 Dependence on supplemental oxygen: Secondary | ICD-10-CM | POA: Diagnosis not present

## 2020-07-31 DIAGNOSIS — I25810 Atherosclerosis of coronary artery bypass graft(s) without angina pectoris: Secondary | ICD-10-CM | POA: Diagnosis not present

## 2020-07-31 DIAGNOSIS — J449 Chronic obstructive pulmonary disease, unspecified: Secondary | ICD-10-CM | POA: Diagnosis not present

## 2020-07-31 DIAGNOSIS — Z7984 Long term (current) use of oral hypoglycemic drugs: Secondary | ICD-10-CM | POA: Insufficient documentation

## 2020-07-31 DIAGNOSIS — G4733 Obstructive sleep apnea (adult) (pediatric): Secondary | ICD-10-CM | POA: Insufficient documentation

## 2020-07-31 DIAGNOSIS — I350 Nonrheumatic aortic (valve) stenosis: Secondary | ICD-10-CM | POA: Insufficient documentation

## 2020-07-31 DIAGNOSIS — Z953 Presence of xenogenic heart valve: Secondary | ICD-10-CM | POA: Diagnosis not present

## 2020-07-31 DIAGNOSIS — I110 Hypertensive heart disease with heart failure: Secondary | ICD-10-CM | POA: Diagnosis not present

## 2020-07-31 DIAGNOSIS — Z87891 Personal history of nicotine dependence: Secondary | ICD-10-CM | POA: Diagnosis not present

## 2020-07-31 DIAGNOSIS — I428 Other cardiomyopathies: Secondary | ICD-10-CM | POA: Insufficient documentation

## 2020-07-31 DIAGNOSIS — E119 Type 2 diabetes mellitus without complications: Secondary | ICD-10-CM | POA: Insufficient documentation

## 2020-07-31 DIAGNOSIS — Z5181 Encounter for therapeutic drug level monitoring: Secondary | ICD-10-CM | POA: Insufficient documentation

## 2020-07-31 DIAGNOSIS — Z951 Presence of aortocoronary bypass graft: Secondary | ICD-10-CM | POA: Diagnosis not present

## 2020-07-31 DIAGNOSIS — I5022 Chronic systolic (congestive) heart failure: Secondary | ICD-10-CM | POA: Diagnosis not present

## 2020-07-31 DIAGNOSIS — Z79899 Other long term (current) drug therapy: Secondary | ICD-10-CM | POA: Insufficient documentation

## 2020-07-31 MED ORDER — METOPROLOL SUCCINATE ER 50 MG PO TB24
50.0000 mg | ORAL_TABLET | Freq: Every day | ORAL | 3 refills | Status: DC
Start: 2020-07-31 — End: 2020-08-10

## 2020-07-31 NOTE — Patient Instructions (Signed)
It was a pleasure seeing you today!  MEDICATIONS: -We are changing your medications today -Increase metoprolol succinate to 50 mg (1 tablet) daily.  -Call if you have questions about your medications.   NEXT APPOINTMENT: Return to clinic in 3 weeks with Pharmacy Clinic.  In general, to take care of your heart failure: -Limit your fluid intake to 2 Liters (half-gallon) per day.   -Limit your salt intake to ideally 2-3 grams (2000-3000 mg) per day. -Weigh yourself daily and record, and bring that "weight diary" to your next appointment.  (Weight gain of 2-3 pounds in 1 day typically means fluid weight.) -The medications for your heart are to help your heart and help you live longer.   -Please contact us before stopping any of your heart medications.  Call the clinic at [phone] with questions or to reschedule future appointments.

## 2020-08-04 ENCOUNTER — Inpatient Hospital Stay: Payer: Medicare Other

## 2020-08-04 ENCOUNTER — Telehealth (HOSPITAL_COMMUNITY): Payer: Self-pay | Admitting: Emergency Medicine

## 2020-08-04 ENCOUNTER — Other Ambulatory Visit: Payer: Self-pay

## 2020-08-04 VITALS — BP 101/71 | HR 70 | Temp 98.4°F | Resp 18

## 2020-08-04 DIAGNOSIS — E119 Type 2 diabetes mellitus without complications: Secondary | ICD-10-CM | POA: Diagnosis not present

## 2020-08-04 DIAGNOSIS — Z794 Long term (current) use of insulin: Secondary | ICD-10-CM | POA: Diagnosis not present

## 2020-08-04 DIAGNOSIS — D45 Polycythemia vera: Secondary | ICD-10-CM | POA: Insufficient documentation

## 2020-08-04 DIAGNOSIS — D75839 Thrombocytosis, unspecified: Secondary | ICD-10-CM | POA: Diagnosis not present

## 2020-08-04 DIAGNOSIS — Z7982 Long term (current) use of aspirin: Secondary | ICD-10-CM | POA: Diagnosis not present

## 2020-08-04 DIAGNOSIS — Z87891 Personal history of nicotine dependence: Secondary | ICD-10-CM | POA: Diagnosis not present

## 2020-08-04 HISTORY — DX: Polycythemia vera: D45

## 2020-08-04 NOTE — Patient Instructions (Signed)

## 2020-08-04 NOTE — Telephone Encounter (Signed)
Reaching out to patient to offer assistance regarding upcoming cardiac imaging study; pt verbalizes understanding of appt date/time, parking situation and where to check in, and verified current allergies; name and call back number provided for further questions should they arise Marchia Bond RN Navigator Cardiac Imaging [facility] [phone] office [phone] cell   Denies implants other that teeth fillings, denies claustro

## 2020-08-04 NOTE — Progress Notes (Signed)
[name] presents today for phlebotomy per MD orders. Phlebotomy procedure started at 1340 and ended at 1350 538 grams removed via 16 gauge needle to right AC.  Patient observed for 30 minutes after procedure without any incident. Patient tolerated procedure well and received replacement fluids after procedure.  Patient understands to call if he has any questions or concerns post discharge. Discharged ambulatory with no complaints

## 2020-08-05 ENCOUNTER — Other Ambulatory Visit (HOSPITAL_COMMUNITY): Payer: Self-pay | Admitting: Internal Medicine

## 2020-08-07 ENCOUNTER — Other Ambulatory Visit: Payer: Self-pay

## 2020-08-07 ENCOUNTER — Ambulatory Visit (HOSPITAL_COMMUNITY)
Admission: RE | Admit: 2020-08-07 | Discharge: 2020-08-07 | Disposition: A | Discharge status: Home / Self Care | Payer: Medicare Other | Source: Ambulatory Visit | Attending: Internal Medicine | Admitting: Internal Medicine

## 2020-08-07 ENCOUNTER — Other Ambulatory Visit (HOSPITAL_COMMUNITY): Payer: Self-pay | Admitting: Internal Medicine

## 2020-08-07 DIAGNOSIS — R079 Chest pain, unspecified: Secondary | ICD-10-CM

## 2020-08-07 MED ORDER — GADOBUTROL 1 MMOL/ML IV SOLN
10.0000 mL | Freq: Once | INTRAVENOUS | Status: AC | PRN
  Administered 2020-08-07: 10 mL via INTRAVENOUS

## 2020-08-09 ENCOUNTER — Other Ambulatory Visit (HOSPITAL_COMMUNITY): Payer: Self-pay | Admitting: *Deleted

## 2020-08-09 ENCOUNTER — Telehealth (HOSPITAL_COMMUNITY): Payer: Self-pay | Admitting: Pharmacy Technician

## 2020-08-09 MED ORDER — ENTRESTO 49-51 MG PO TABS
1.0000 | ORAL_TABLET | Freq: Two times a day (BID) | ORAL | 3 refills | Status: DC
Start: 2020-08-09 — End: 2020-09-05

## 2020-08-09 MED ORDER — DAPAGLIFLOZIN PROPANEDIOL 5 MG PO TABS
5.0000 mg | ORAL_TABLET | Freq: Every day | ORAL | 3 refills | Status: DC
Start: 2020-08-09 — End: 2020-08-10

## 2020-08-09 NOTE — Telephone Encounter (Signed)
Was able to obtain a co-pay card for [place] for the patient.   Farxiga $0 BIN [id number] PCN CN ID [id number] Group [id number]  Entresto $10 BIN [id number] PCN OHCP ID [id number] Group [id number]  Called and updated the patient. Called the patient's pharmacy and gave the billing information. Sent CMA a request to send in 90 day supplies of both RXs to save him the most money.   Charlann Boxer, CPhT

## 2020-08-10 ENCOUNTER — Other Ambulatory Visit: Payer: Self-pay

## 2020-08-10 DIAGNOSIS — R011 Cardiac murmur, unspecified: Secondary | ICD-10-CM | POA: Insufficient documentation

## 2020-08-10 DIAGNOSIS — J449 Chronic obstructive pulmonary disease, unspecified: Secondary | ICD-10-CM | POA: Insufficient documentation

## 2020-08-10 DIAGNOSIS — I10 Essential (primary) hypertension: Secondary | ICD-10-CM | POA: Insufficient documentation

## 2020-08-10 DIAGNOSIS — M545 Low back pain, unspecified: Secondary | ICD-10-CM | POA: Insufficient documentation

## 2020-08-10 DIAGNOSIS — R0902 Hypoxemia: Secondary | ICD-10-CM | POA: Insufficient documentation

## 2020-08-11 ENCOUNTER — Other Ambulatory Visit: Payer: Self-pay

## 2020-08-11 ENCOUNTER — Inpatient Hospital Stay: Payer: Medicare Other

## 2020-08-11 VITALS — BP 98/64 | HR 73 | Temp 98.0°F | Resp 18

## 2020-08-11 DIAGNOSIS — D75839 Thrombocytosis, unspecified: Secondary | ICD-10-CM | POA: Diagnosis not present

## 2020-08-11 DIAGNOSIS — Z794 Long term (current) use of insulin: Secondary | ICD-10-CM | POA: Diagnosis not present

## 2020-08-11 DIAGNOSIS — D45 Polycythemia vera: Secondary | ICD-10-CM

## 2020-08-11 DIAGNOSIS — Z7982 Long term (current) use of aspirin: Secondary | ICD-10-CM | POA: Diagnosis not present

## 2020-08-11 DIAGNOSIS — E119 Type 2 diabetes mellitus without complications: Secondary | ICD-10-CM | POA: Diagnosis not present

## 2020-08-11 DIAGNOSIS — Z87891 Personal history of nicotine dependence: Secondary | ICD-10-CM | POA: Diagnosis not present

## 2020-08-11 NOTE — Progress Notes (Signed)
Referring Physician: Dr. Harriet Masson Primary Care: Colon Branch, MD Primary Cardiologist: Dr. Harriet Masson HF Cardiologist: Dr. Haroldine Laws  HPI:  [name] Nooney Corneliusis a 69 y.o.maleformer smoker (47 py, quit 2012) with COPD, non-obstructive CAD, severe aortic stenosis s/p bioprosthetic AVR/CABG (LIMA to D1 -> LAD)  in 2019, non-ischemic cardiomyopathy most recent EF on March 27, 2020 was 25 to 30%, OSA, hypertension hyperlipidemia and diabetes referred by Dr. Harriet Masson for further evaluation of his HF.  Echo at time of AVR in 2019 EF 35-40%  Underwent LHC in 9/21 for worsening fatigue and DOE.  LM ok LAD 65% LCX 30% Ramus 65% RCA 20%  Patent but atretic LIMA graft LVEDP 7  He recently presented for follow up visit on 07/06/20. 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 Fairly active around the house but had been slowing down down some (now takes 2 days to cut the grass instead of 1). Could walk around the store without stopping. Stated he gets his exercise in there. No CP. No edema, orthopnea or PND. Rare dizziness when standing. Reported taking his BP regularly. Usually 140/80s. Not wearing CPAP.  Recently returned to HF clinic for pharmacist medication titration. At last visit with MD, [name] was increased to 49/51 mg BID. Additionally, Farxiga 5 mg daily was restarted at his endocrinology appointment on 07/25/20. Overall he was feeling well. No dizziness, lightheadedness, chest pain or palpitations. His breathing had been good. Could walk 0.25 miles before becoming SOB. His weight had been stable. He does not take any diuretic. No LEE, PND or orthopnea.   Today he returns to HF clinic for pharmacist medication titration. At last visit with pharmacy clinic, metoprolol succinate was increased to 50 mg daily. Overall he is feeling well today. No dizziness, lightheadedness, chest pain or palpitations. No SOB/DOE with mild-moderate activity. Weight at home has been stable. He does not take any diuretic. No  LEE, PND or orthopnea. Taking all medications as prescribed and tolerating all medications.      HF Medications: Metoprolol succinate 50 mg daily Entresto 49/51 mg BID Spironolactone 12.5 mg daily Farxiga 5 mg daily Hydralazine 10 mg TID  Has the patient been experiencing any side effects to the medications prescribed?  no  Does the patient have any problems obtaining medications due to transportation or finances?   No; has Medicare A/B and [place]. Able to use copay cards for [place].   Understanding of regimen: good Understanding of indications: good Potential of compliance: good Patient understands to avoid NSAIDs. Patient understands to avoid decongestants.    Pertinent Lab Values (07/11/20): Marland Kitchen Serum creatinine 0.94, BUN 14, Potassium 4.1, Sodium 141  Vital Signs: . Weight: 176.2 lbs (last clinic weight: 179.4 lbs) . Blood pressure: 104/66 . Heart rate: 78   Assessment: 1. Chronic systolic HF - primarily NICM. Had LIMA to LAD at time of CABG 2019 but on cath in 9/21 LAD lesion 65% and LIMA atretic - echo 2019 EF 35-40% - Echo 8/21 EF 25-30% - Doing well NYHA II - Volume status ok. Recent cath with LVEDP 7 - Continue metoprolol succinate 50 mg daily - Continue Entresto 49/51 mg BID - Continue spironolactone 12.5 daily - Continue Farxiga 5 mg daily. Recently initiated by endocrinologist.  - Continue hydralazine 10 mg TID - Start isosorbide mononitrate 15 mg daily.  - Plan cMRI t further evaluate etiology of CM - If becomes more symptomatic will need CPX to assess lung vs cardiac limitation  - Discussed  possible referral for CID based on EF by cMRI  2. Severe AS - s/p bioprosthetic AVR 2019 - recent cath with no gradient - reinforced need for SBE prophylaxis  3. CAD - Had LIMA to LAD at time of CABG 2019 but on cath in 9/21 LAD lesion 65% and LIMA atretic - Continue ASA/statin  4. COPD, Gold II - followed by Pulmonary  - wears 2L  O2 at night    Plan: 1) Medication changes: Based on clinical presentation, vital signs and recent labs will start isosorbide mononitrate 15 mg daily.   3) Follow-up: 2 weeks with Dr. Ballard Russell, PharmD, BCPS, [facility], CPP Heart Failure Clinic Pharmacist [phone]

## 2020-08-14 ENCOUNTER — Ambulatory Visit (INDEPENDENT_AMBULATORY_CARE_PROVIDER_SITE_OTHER): Payer: Medicare Other | Admitting: Cardiology

## 2020-08-14 ENCOUNTER — Other Ambulatory Visit: Payer: Self-pay

## 2020-08-14 ENCOUNTER — Encounter: Payer: Self-pay | Admitting: Cardiology

## 2020-08-14 VITALS — BP 134/82 | HR 82 | Ht 76.0 in | Wt 181.0 lb

## 2020-08-14 DIAGNOSIS — R0989 Other specified symptoms and signs involving the circulatory and respiratory systems: Secondary | ICD-10-CM | POA: Diagnosis not present

## 2020-08-14 DIAGNOSIS — IMO0002 Reserved for concepts with insufficient information to code with codable children: Secondary | ICD-10-CM

## 2020-08-14 DIAGNOSIS — I255 Ischemic cardiomyopathy: Secondary | ICD-10-CM | POA: Diagnosis not present

## 2020-08-14 DIAGNOSIS — I2510 Atherosclerotic heart disease of native coronary artery without angina pectoris: Secondary | ICD-10-CM

## 2020-08-14 DIAGNOSIS — Z952 Presence of prosthetic heart valve: Secondary | ICD-10-CM

## 2020-08-14 DIAGNOSIS — I10 Essential (primary) hypertension: Secondary | ICD-10-CM | POA: Diagnosis not present

## 2020-08-14 DIAGNOSIS — E782 Mixed hyperlipidemia: Secondary | ICD-10-CM

## 2020-08-14 DIAGNOSIS — E1165 Type 2 diabetes mellitus with hyperglycemia: Secondary | ICD-10-CM

## 2020-08-14 DIAGNOSIS — E1140 Type 2 diabetes mellitus with diabetic neuropathy, unspecified: Secondary | ICD-10-CM

## 2020-08-14 NOTE — Patient Instructions (Signed)
Medication Instructions:  Your physician recommends that you continue on your current medications as directed. Please refer to the Current Medication list given to you today.  *If you need a refill on your cardiac medications before your next appointment, please call your pharmacy*   Lab Work: NONE. If you have labs (blood work) drawn today and your tests are completely normal, you will receive your results only by: . MyChart Message (if you have MyChart) OR . A paper copy in the mail If you have any lab test that is abnormal or we need to change your treatment, we will call you to review the results.   Testing/Procedures: NONE.   Follow-Up: At [facility], you and your health needs are our priority.  As part of our continuing mission to provide you with exceptional heart care, we have created designated Provider Care Teams.  These Care Teams include your primary Cardiologist (physician) and Advanced Practice Providers (APPs -  Physician Assistants and Nurse Practitioners) who all work together to provide you with the care you need, when you need it.  We recommend signing up for the patient portal called "MyChart".  Sign up information is provided on this After Visit Summary.  MyChart is used to connect with patients for Virtual Visits (Telemedicine).  Patients are able to view lab/test results, encounter notes, upcoming appointments, etc.  Non-urgent messages can be sent to your provider as well.   To learn more about what you can do with MyChart, go to [email or web address].    Your next appointment:   3 month(s)  The format for your next appointment:   In Person  Provider:   Kardie Tobb, DO   Other Instructions   

## 2020-08-14 NOTE — Progress Notes (Signed)
Cardiology Office Note:    Date:  08/14/2020   ID:  HOLLIE BARTUS, DOB 05/13/1951, MRN [id number]   PCP:  Colon Branch, MD  Cardiologist:  Berniece Salines, DO  Electrophysiologist:  None   Referring MD: Colon Branch, MD   " I am doing well"   History of Present Illness:    [name] is a 69 y.o. male with a hx of coronary artery disease status post CABG in 2019, ischemic cardiomyopathy most recent EF on March 27, 2020 at which time his EF was reported to be 25 to 30%,Status post aortic valve replacement with a 23 Edwards valve also in July 2019, hypertension hyperlipidemia and diabetes.  Polycythemia vera and is undergoing phlebotomy.  I saw the patient on April 05, 2020 at that time we kept him on his losartan as well as his beta-blocker and added Aldactone.   When I saw the patient 1 May 05, 2020 he still with experiencing significant fatigue and shortness of breath.  Given his history of CAD I recommended he undergo a left heart catheterization to make sure that worsening/progression of CAD was not causing symptoms.  He recently underwent a left heart catheterization which showed moderate CAD but no indication for PCI.  I recommended that the patient see our advanced heart failure clinic as well he has been following.  Past Medical History:  Diagnosis Date  . Abnormal myocardial perfusion study 2006   EF 44% ? inferoseptal ischemia. no cath done  . Benign neoplasm of cecum   . Breast mass, right 03/2008  . Cardiomyopathy ([place]) 07/20/2018   Ejection fraction 4045% in September 2019  . Centrilobular emphysema ([place]) 08/25/2018   02/04/2018-high-res CT- no evidence of interstitial lung disease, severe centrilobular emphysema and mild diffuse bronchial wall thickening suggesting COPD, no calcified pleural plaques no pleural effusion, mild smooth bilateral pleural thickening which is nonspecific  02/04/2018-pulmonary function test- FVC 3.37 (71% predicted),  postbronchodilator ratio 50, postbronchodilator FEV1 54, mid flow reve  . COLONIC POLYPS, HX OF 03/18/2007   Qualifier: Diagnosis of  By: [name]   . Contact dermatitis 01/31/2013  . COPD (chronic obstructive pulmonary disease) ([place])    "dx'd 12/2017"  . COPD GOLD II A  08/25/2018   02/04/2018-high-res CT- no evidence of interstitial lung disease, severe centrilobular emphysema and mild diffuse bronchial wall thickening suggesting COPD, no calcified pleural plaques no pleural effusion, mild smooth bilateral pleural thickening which is nonspecific  02/04/2018-pulmonary function test- FVC 3.37 (71% predicted), postbronchodilator ratio 50, postbronchodilator FEV1 54, mid flow reve  . Coronary artery disease   . Critical aortic valve stenosis 01/12/2018   Calculated valve area less than 0.5 cm mean gradient more than 60 mm of mercury  . DEGENERATIVE JOINT DISEASE, BACK 03/18/2007   Qualifier: Diagnosis of  By: [name]   . Depressed left ventricular ejection fraction 03/28/2020  . Dyspnea on exertion 01/12/2018  . ERECTILE DYSFUNCTION, ORGANIC 02/10/2009   Qualifier: Diagnosis of  By: [name]   . Heart murmur   . HEMORRHOIDS, INTERNAL 03/18/2007   Qualifier: Diagnosis of  By: [name]   . Hyperlipemia 10/21/2007   Qualifier: Diagnosis of  By: [name]   . Hypertension   . Hypoglycemia due to insulin 03/26/2020  . Low back pain   . MYOCARDIAL PERFUSION SCAN, WITH STRESS TEST, ABNORMAL 08/11/2008   Qualifier: Diagnosis of  By: Johnsie Cancel, MD, Rona Ravens   .  Nocturnal hypoxemia 08/25/2018   04/09/18 >>> ONO - telephone note>>> Patient's ONO on room air showed that he was less than 88% for more than an hour. Patient needs to be on 2 L at HS.  10/06/2018-overnight oximetry-entire duration was 4 hours and 23 minutes, SPO2 less than 88% for 25 minutes and 32 seconds    . Oxygen deficiency    uses 2L at night  . Paroxysmal atrial fibrillation ([place]) 10/12/2018  .  S/P AVR 03/04/2018   AVR 23 mm Edwards Magna Valve - bioprostetic 03/04/2018 LIMA - LAD  . Status post coronary artery bypass graft 03/20/2018   LIMA to LAD during aortic valve replacement surgery 03/04/2018  . Type 2 diabetes mellitus, uncontrolled, with neuropathy ([place]) 05/20/2008   Qualifier: Diagnosis of  By: [name]     Past Surgical History:  Procedure Laterality Date  . ABDOMINAL AORTOGRAM N/A 01/13/2018   Procedure: ABDOMINAL AORTOGRAM;  Surgeon: Troy Sine, MD;  Location: [facility];  Service: Cardiovascular;  Laterality: N/A;  . AORTIC VALVE REPLACEMENT N/A 03/04/2018   Procedure: AORTIC VALVE REPLACEMENT (AVR) 36mm Edwards Magna Ease Tissue Valve.;  Surgeon: Melrose Nakayama, MD;  Location: [place];  Service: Open Heart Surgery;  Laterality: N/A;  . CARDIAC VALVE REPLACEMENT    . COLONOSCOPY W/ POLYPECTOMY    . COLONOSCOPY WITH PROPOFOL N/A 02/22/2020   Procedure: COLONOSCOPY WITH PROPOFOL;  Surgeon: Doran Stabler, MD;  Location: [facility];  Service: Gastroenterology;  Laterality: N/A;  . CORONARY ARTERY BYPASS GRAFT N/A 03/04/2018   Procedure: CORONARY ARTERY BYPASS GRAFTING (CABG) x1:  LIMA to LAD.;  Surgeon: Melrose Nakayama, MD;  Location: [place];  Service: Open Heart Surgery;  Laterality: N/A;  . HEMOSTASIS CLIP PLACEMENT  02/22/2020   Procedure: HEMOSTASIS CLIP PLACEMENT;  Surgeon: Doran Stabler, MD;  Location: [facility];  Service: Gastroenterology;;  . Cruzita Lederer PRESSURE WIRE/FFR STUDY N/A 05/10/2020   Procedure: INTRAVASCULAR PRESSURE WIRE/FFR STUDY;  Surgeon: Sherren Mocha, MD;  Location: [facility];  Service: Cardiovascular;  Laterality: N/A;  . LEFT HEART CATH AND CORS/GRAFTS ANGIOGRAPHY N/A 05/10/2020   Procedure: LEFT HEART CATH AND CORS/GRAFTS ANGIOGRAPHY;  Surgeon: Sherren Mocha, MD;  Location: [facility];  Service: Cardiovascular;  Laterality: N/A;  . LIPOMA EXCISION Left 07/2008    "back of shoulder" Dr  Gershon Crane  . POLYPECTOMY  02/22/2020   Procedure: POLYPECTOMY;  Surgeon: Doran Stabler, MD;  Location: [facility];  Service: Gastroenterology;;  . RIGHT/LEFT HEART CATH AND CORONARY ANGIOGRAPHY N/A 01/13/2018   Procedure: RIGHT/LEFT HEART CATH AND CORONARY ANGIOGRAPHY;  Surgeon: Troy Sine, MD;  Location: [facility];  Service: Cardiovascular;  Laterality: N/A;  . TEE WITHOUT CARDIOVERSION N/A 03/04/2018   Procedure: TRANSESOPHAGEAL ECHOCARDIOGRAM (TEE);  Surgeon: Melrose Nakayama, MD;  Location: [place];  Service: Open Heart Surgery;  Laterality: N/A;  . TONSILLECTOMY    . WISDOM TOOTH EXTRACTION      Current Medications: Current Meds  Medication Sig  . acetaminophen (TYLENOL) 500 MG tablet Take 1,000 mg by mouth every 6 (six) hours as needed for moderate pain or headache.  Marland Kitchen aspirin EC 325 MG EC tablet Take 1 tablet (325 mg total) by mouth daily.  . cetirizine (ZYRTEC) 10 MG tablet Take 10 mg by mouth daily as needed for allergies.   . Cyanocobalamin (VITAMIN B-12 PO) Take 1 tablet by mouth daily.  Marland Kitchen FARXIGA 10 MG TABS tablet  Take 10 mg by mouth daily.  Marland Kitchen glucose blood (PRODIGY NO CODING BLOOD GLUC) test strip Use as instructed to check blood sugar twice a day.  DX  E11.40  . hydrALAZINE (APRESOLINE) 10 MG tablet Take 1 tablet (10 mg total) by mouth 3 (three) times daily.  . insulin aspart (NOVOLOG FLEXPEN) 100 UNIT/ML FlexPen Inject 6 Units into the skin with breakfast, with lunch, and with evening meal.  . insulin detemir (LEVEMIR FLEXTOUCH) 100 UNIT/ML FlexPen Inject 18 Units into the skin every morning.  . Insulin Pen Needle 32G X 4 MM MISC 1 Device by Does not apply route as directed.  . metFORMIN (GLUCOPHAGE) 1000 MG tablet Take 1 tablet (1,000 mg total) by mouth daily.  . metoprolol succinate (TOPROL-XL) 25 MG 24 hr tablet Take 25 mg by mouth daily.  . rosuvastatin (CRESTOR) 20 MG tablet Take 1 tablet (20 mg total) by mouth daily.  . sacubitril-valsartan (ENTRESTO)  49-51 MG Take 1 tablet by mouth 2 (two) times daily.  Marland Kitchen spironolactone (ALDACTONE) 25 MG tablet Take 0.5 tablets (12.5 mg total) by mouth daily.     Allergies:   Amlodipine besylate, Hydrochlorothiazide, and Lisinopril   Social History   Socioeconomic History  . Marital status: Married    Spouse name: Not on file  . Number of children: Not on file  . Years of education: Not on file  . Highest education level: Not on file  Occupational History  . Not on file  Tobacco Use  . Smoking status: Former Smoker    Packs/day: 1.50    Years: 41.00    Pack years: 61.50    Types: Cigarettes    Quit date: 08/26/2010    Years since quitting: 9.9  . Smokeless tobacco: Never Used  . Tobacco comment: Quit 2012  Vaping Use  . Vaping Use: Never used  Substance and Sexual Activity  . Alcohol use: Yes    Comment: occasionally  . Drug use: Not Currently  . Sexual activity: Not Currently  Other Topics Concern  . 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 Difficulty of Paying Living Expenses: Not hard at all  Food Insecurity: No Food Insecurity  . Worried About Charity fundraiser in the Last Year: Never true  . Ran Out of Food in the Last Year: Never true  Transportation Needs: No Transportation Needs  . Lack of Transportation (Medical): No  . Lack of Transportation (Non-Medical): No  Physical Activity: Not on file  Stress: Not on file  Social Connections: Not on file     Family History: The patient's family history includes Alzheimer's disease in his father; Diabetes in his father; Hypertension in his mother; Melanoma (age of onset: 61) in his father. There is no history of Colon cancer, Esophageal cancer, Colon polyps, or Stomach cancer.  ROS:   Review of Systems  Constitution:  Negative for decreased appetite, fever and weight gain.  HENT: Negative for congestion, ear discharge, hoarse voice and sore throat.   Eyes: Negative for discharge, redness, vision loss in right eye and visual halos.  Cardiovascular: Negative for chest pain, dyspnea on exertion, leg swelling, orthopnea and palpitations.  Respiratory: Negative  for cough, hemoptysis, shortness of breath and snoring.   Endocrine: Negative for heat intolerance and polyphagia.  Hematologic/Lymphatic: Negative for bleeding problem. Does not bruise/bleed easily.  Skin: Negative for flushing, nail changes, rash and suspicious lesions.  Musculoskeletal: Negative for arthritis, joint pain, muscle cramps, myalgias, neck pain and stiffness.  Gastrointestinal: Negative for abdominal pain, bowel incontinence, diarrhea and excessive appetite.  Genitourinary: Negative for decreased libido, genital sores and incomplete emptying.  Neurological: Negative for brief paralysis, focal weakness, headaches and loss of balance.  Psychiatric/Behavioral: Negative for altered mental status, depression and suicidal ideas.  Allergic/Immunologic: Negative for HIV exposure and persistent infections.    EKGs/Labs/Other Studies Reviewed:    The following studies were reviewed today:   EKG: None today  Cardiac MRI FINDINGS: Limited images of the lung fields showed no gross abnormalities. The patient is status post sternotomy.  Mildly dilated left ventricle with mild LV hypertrophy. Septal dyskinesis, EF calculated at 25% (lower than it looks visually, likely due to dyskinesis). Normal right ventricular size with mildly decreased systolic function, EF 85%. Normal left and right atrial sizes. Bioprosthetic aortic valve with minimal regurgitation noted. No significant mitral regurgitation noted.  Difficult/poor quality delayed enhancement images. Possible small area of subepicardial late gadolinium enhancement (LGE) in the  mid inferoseptal wall at the RV insertion site.  Measurements:  LVEDV 176 mL LVSV 45 mL LVEF 25%  RVEDV 132 mL RVSV 48 mL RVEF 37%  IMPRESSION: 1. Mildly dilated LV with mild LV hypertrophy. Dyskinetic septal wall with EF 25%.  2.  Normal RV size with mildly decreased systolic function, EF 02%.  3. Poor delayed enhancement images. No coronary disease-type LGE noted. Possible inferoseptal wall RV insertion site LGE, nonspecific and suggestive of volume/pressure overload.    Recent Labs: 03/26/2020: TSH 0.574 06/07/2020: Magnesium 1.9 07/06/2020: B Natriuretic Peptide 61.0 07/11/2020: ALT 9; BUN 14; Creatinine 0.94; Hemoglobin 16.3; Platelet Count 548; Potassium 4.1; Sodium 141  Recent Lipid Panel    Component Value Date/Time   CHOL 127 03/23/2020 1129   TRIG 66.0 03/23/2020 1129   HDL 50.00 03/23/2020 1129   CHOLHDL 3 03/23/2020 1129   VLDL 13.2 03/23/2020 1129   LDLCALC 64 03/23/2020 1129   LDLDIRECT 177.6 10/07/2007 1001    Physical Exam:    VS:  BP 134/82   Pulse 82   Ht 6\' 4"  (1.93 m)   Wt 181 lb (82.1 kg)   SpO2 96%   BMI 22.03 kg/m     Wt Readings from Last 3 Encounters:  08/14/20 181 lb (82.1 kg)  07/31/20 179 lb 6.4 oz (81.4 kg)  07/25/20 180 lb 4 oz (81.8 kg)     GEN: Well nourished, well developed in no acute distress HEENT: Normal NECK: No JVD; No carotid bruits LYMPHATICS: No lymphadenopathy CARDIAC: S1S2 noted,RRR, no murmurs, rubs, gallops RESPIRATORY:  Clear to auscultation without rales, wheezing or rhonchi  ABDOMEN: Soft, non-tender, non-distended, +bowel sounds, no guarding. EXTREMITIES: No edema, No cyanosis, no clubbing MUSCULOSKELETAL:  No deformity  SKIN: Warm and dry NEUROLOGIC:  Alert and oriented x 3, non-focal PSYCHIATRIC:  Normal affect, good insight  ASSESSMENT:    1. Ischemic cardiomyopathy   2. Depressed left ventricular ejection fraction   3. Primary hypertension   4. Coronary artery disease involving  native coronary artery of native heart without angina pectoris   5. Type 2 diabetes mellitus, uncontrolled, with neuropathy ([place])   6. Mixed hyperlipidemia   7. S/P AVR    PLAN:  Clinically he is improving he tells me.  He is still working outdoors.  His recent visit with our advanced heart failure clinic is Delene Loll was increased to 49-51 twice daily.  We will continue his beta-blocker to 25 mg daily, Aldactone 12.5.  Hydralazine 10 mg 3 times daily and Farxiga 10 mg daily.  His recent cardiac MRI shows an EF of 25%.  With no suggestion of any infiltrative causes for his cardiomyopathy.  We will continue to titrate up his medication regimen. I plan to refer the patient to our EP partners for evaluation of implantable defibrillator.    No angina symptoms at this time.  Continue the patient on his aspirin and statin.  Diabetes mellitus is being managed by his PCP.  The patient is in agreement with the above plan. The patient left the office in stable condition.  The patient will follow up in 3 months or sooner if needed.   Medication Adjustments/Labs and Tests Ordered: Current medicines are reviewed at length with the patient today.  Concerns regarding medicines are outlined above.  No orders of the defined types were placed in this encounter.  No orders of the defined types were placed in this encounter.   Patient Instructions  Medication Instructions:  Your physician recommends that you continue on your current medications as directed. Please refer to the Current Medication list given to you today.  *If you need a refill on your cardiac medications before your next appointment, please call your pharmacy*   Lab Work: NONE If you have labs (blood work) drawn today and your tests are completely normal, you will receive your results only by: Marland Kitchen MyChart Message (if you have MyChart) OR . A paper copy in the mail If you have any lab test that is abnormal or we need to change your  treatment, we will call you to review the results.   Testing/Procedures: NONE   Follow-Up: At [facility], you and your health needs are our priority.  As part of our continuing mission to provide you with exceptional heart care, we have created designated Provider Care Teams.  These Care Teams include your primary Cardiologist (physician) and Advanced Practice Providers (APPs -  Physician Assistants and Nurse Practitioners) who all work together to provide you with the care you need, when you need it.  We recommend signing up for the patient portal called "MyChart".  Sign up information is provided on this After Visit Summary.  MyChart is used to connect with patients for Virtual Visits (Telemedicine).  Patients are able to view lab/test results, encounter notes, upcoming appointments, etc.  Non-urgent messages can be sent to your provider as well.   To learn more about what you can do with MyChart, go to [email or web address].    Your next appointment:   3 month(s)  The format for your next appointment:   In Person  Provider:   Berniece Salines, DO   Other Instructions      Adopting a Healthy Lifestyle.  Know what a healthy weight is for you (roughly BMI <25) and aim to maintain this   Aim for 7+ servings of fruits and vegetables daily   65-80+ fluid ounces of water or unsweet tea for healthy kidneys   Limit to max 1 drink of alcohol per day; avoid smoking/tobacco   Limit animal fats in diet for cholesterol and heart health - choose grass fed whenever available   Avoid highly processed foods, and foods high in saturated/trans fats   Aim for  low stress - take time to unwind and care for your mental health   Aim for 150 min of moderate intensity exercise weekly for heart health, and weights twice weekly for bone health   Aim for 7-9 hours of sleep daily   When it comes to diets, agreement about the perfect plan isnt easy to find, even among the experts. Experts at  the [employer] developed an idea known as the Healthy Eating Plate. Just imagine a plate divided into logical, healthy portions.   The emphasis is on diet quality:   Load up on vegetables and fruits - one-half of your plate: Aim for color and variety, and remember that potatoes dont count.   Go for whole grains - one-quarter of your plate: Whole wheat, barley, wheat berries, quinoa, oats, brown rice, and foods made with them. If you want pasta, go with whole wheat pasta.   Protein power - one-quarter of your plate: Fish, chicken, beans, and nuts are all healthy, versatile protein sources. Limit red meat.   The diet, however, does go beyond the plate, offering a few other suggestions.   Use healthy plant oils, such as olive, canola, soy, corn, sunflower and peanut. Check the labels, and avoid partially hydrogenated oil, which have unhealthy trans fats.   If youre thirsty, drink water. Coffee and tea are good in moderation, but skip sugary drinks and limit milk and dairy products to one or two daily servings.   The type of carbohydrate in the diet is more important than the amount. Some sources of carbohydrates, such as vegetables, fruits, whole grains, and beans-are healthier than others.   Finally, stay active  Signed, Berniece Salines, DO  08/14/2020 10:39 AM    [place]

## 2020-08-23 ENCOUNTER — Ambulatory Visit (HOSPITAL_COMMUNITY)
Admission: RE | Admit: 2020-08-23 | Discharge: 2020-08-23 | Disposition: A | Discharge status: Home / Self Care | Payer: Medicare Other | Source: Ambulatory Visit | Attending: Cardiology | Admitting: Cardiology

## 2020-08-23 ENCOUNTER — Other Ambulatory Visit: Payer: Self-pay

## 2020-08-23 VITALS — BP 104/66 | HR 78 | Wt 176.2 lb

## 2020-08-23 DIAGNOSIS — I5022 Chronic systolic (congestive) heart failure: Secondary | ICD-10-CM | POA: Diagnosis not present

## 2020-08-23 DIAGNOSIS — I2510 Atherosclerotic heart disease of native coronary artery without angina pectoris: Secondary | ICD-10-CM | POA: Insufficient documentation

## 2020-08-23 DIAGNOSIS — I110 Hypertensive heart disease with heart failure: Secondary | ICD-10-CM | POA: Insufficient documentation

## 2020-08-23 DIAGNOSIS — J449 Chronic obstructive pulmonary disease, unspecified: Secondary | ICD-10-CM | POA: Diagnosis not present

## 2020-08-23 DIAGNOSIS — I428 Other cardiomyopathies: Secondary | ICD-10-CM | POA: Insufficient documentation

## 2020-08-23 DIAGNOSIS — Z87891 Personal history of nicotine dependence: Secondary | ICD-10-CM | POA: Diagnosis not present

## 2020-08-23 DIAGNOSIS — E785 Hyperlipidemia, unspecified: Secondary | ICD-10-CM | POA: Insufficient documentation

## 2020-08-23 DIAGNOSIS — Z952 Presence of prosthetic heart valve: Secondary | ICD-10-CM | POA: Diagnosis not present

## 2020-08-23 MED ORDER — ISOSORBIDE MONONITRATE ER 30 MG PO TB24
15.0000 mg | ORAL_TABLET | Freq: Every day | ORAL | 11 refills | Status: DC
Start: 2020-08-23 — End: 2020-09-05

## 2020-08-23 MED ORDER — DAPAGLIFLOZIN PROPANEDIOL 5 MG PO TABS: 5.0000 mg | ORAL_TABLET | Freq: Every day | ORAL | 3 refills | Status: DC

## 2020-08-23 MED ORDER — ISOSORBIDE MONONITRATE ER 30 MG PO TB24
30.0000 mg | ORAL_TABLET | Freq: Every day | ORAL | 11 refills | Status: DC
Start: 2020-08-23 — End: 2020-08-23

## 2020-08-23 NOTE — Patient Instructions (Addendum)
It was a pleasure seeing you today!  MEDICATIONS: -We are changing your medications today - Start isosorbide mononitrate 15 mg (1/2 tablet) daily  -Call if you have questions about your medications.   NEXT APPOINTMENT: Return to clinic in 2 weeks with Dr. Gala Romney.  In general, to take care of your heart failure: -Limit your fluid intake to 2 Liters (half-gallon) per day.   -Limit your salt intake to ideally 2-3 grams (2000-3000 mg) per day. -Weigh yourself daily and record, and bring that "weight diary" to your next appointment.  (Weight gain of 2-3 pounds in 1 day typically means fluid weight.) -The medications for your heart are to help your heart and help you live longer.   -Please contact us before stopping any of your heart medications.  Call the clinic at [phone] with questions or to reschedule future appointments.

## 2020-08-24 ENCOUNTER — Encounter: Payer: Self-pay | Admitting: Hematology & Oncology

## 2020-08-24 ENCOUNTER — Inpatient Hospital Stay (HOSPITAL_BASED_OUTPATIENT_CLINIC_OR_DEPARTMENT_OTHER): Payer: Medicare Other | Admitting: Hematology & Oncology

## 2020-08-24 ENCOUNTER — Telehealth: Payer: Self-pay

## 2020-08-24 ENCOUNTER — Other Ambulatory Visit: Payer: Self-pay

## 2020-08-24 ENCOUNTER — Inpatient Hospital Stay: Payer: Medicare Other

## 2020-08-24 VITALS — BP 124/68 | HR 75 | Temp 97.9°F | Resp 18 | Ht 71.0 in | Wt 177.8 lb

## 2020-08-24 DIAGNOSIS — I255 Ischemic cardiomyopathy: Secondary | ICD-10-CM

## 2020-08-24 DIAGNOSIS — D45 Polycythemia vera: Secondary | ICD-10-CM

## 2020-08-24 DIAGNOSIS — D75839 Thrombocytosis, unspecified: Secondary | ICD-10-CM

## 2020-08-24 DIAGNOSIS — E119 Type 2 diabetes mellitus without complications: Secondary | ICD-10-CM | POA: Diagnosis not present

## 2020-08-24 DIAGNOSIS — Z7189 Other specified counseling: Secondary | ICD-10-CM

## 2020-08-24 DIAGNOSIS — Z87891 Personal history of nicotine dependence: Secondary | ICD-10-CM | POA: Diagnosis not present

## 2020-08-24 DIAGNOSIS — D751 Secondary polycythemia: Secondary | ICD-10-CM

## 2020-08-24 DIAGNOSIS — Z794 Long term (current) use of insulin: Secondary | ICD-10-CM | POA: Diagnosis not present

## 2020-08-24 DIAGNOSIS — Z7982 Long term (current) use of aspirin: Secondary | ICD-10-CM | POA: Diagnosis not present

## 2020-08-24 HISTORY — DX: Other specified counseling: Z71.89

## 2020-08-24 LAB — CMP (CANCER CENTER ONLY)
ALT: 9 U/L (ref 0–44)
AST: 13 U/L — ABNORMAL LOW (ref 15–41)
Albumin: 4 g/dL (ref 3.5–5.0)
Alkaline Phosphatase: 40 U/L (ref 38–126)
Anion gap: 6 (ref 5–15)
BUN: 14 mg/dL (ref 8–23)
CO2: 29 mmol/L (ref 22–32)
Calcium: 9.3 mg/dL (ref 8.9–10.3)
Chloride: 106 mmol/L (ref 98–111)
Creatinine: 0.96 mg/dL (ref 0.61–1.24)
GFR, Estimated: >60 mL/min (ref >60)
Glucose, Bld: 102 mg/dL — ABNORMAL HIGH (ref 70–99)
Potassium: 4.1 mmol/L (ref 3.5–5.1)
Sodium: 141 mmol/L (ref 135–145)
Total Bilirubin: 0.4 mg/dL (ref 0.3–1.2)
Total Protein: 6.7 g/dL (ref 6.5–8.1)

## 2020-08-24 LAB — CBC WITH DIFFERENTIAL (CANCER CENTER ONLY)
Abs Immature Granulocytes: 0.03 10*3/uL (ref 0.00–0.07)
Basophils Absolute: 0.1 10*3/uL (ref 0.0–0.1)
Basophils Relative: 1 %
Eosinophils Absolute: 0.1 10*3/uL (ref 0.0–0.5)
Eosinophils Relative: 2 %
HCT: 43.2 % (ref 39.0–52.0)
Hemoglobin: 13.2 g/dL (ref 13.0–17.0)
Immature Granulocytes: 1 %
Lymphocytes Relative: 34 %
Lymphs Abs: 2.1 10*3/uL (ref 0.7–4.0)
MCH: 22 pg — ABNORMAL LOW (ref 26.0–34.0)
MCHC: 30.6 g/dL (ref 30.0–36.0)
MCV: 71.9 fL — ABNORMAL LOW (ref 80.0–100.0)
Monocytes Absolute: 0.7 10*3/uL (ref 0.1–1.0)
Monocytes Relative: 12 %
Neutro Abs: 3.2 10*3/uL (ref 1.7–7.7)
Neutrophils Relative %: 50 %
Platelet Count: 303 10*3/uL (ref 150–400)
RBC: 6.01 MIL/uL — ABNORMAL HIGH (ref 4.22–5.81)
RDW: 15.4 % (ref 11.5–15.5)
WBC Count: 6.2 10*3/uL (ref 4.0–10.5)
nRBC: 0 % (ref 0.0–0.2)

## 2020-08-24 NOTE — Progress Notes (Signed)
Hematology and Oncology Follow Up Visit  [name] [id number] 04-18-1951 69 y.o. 08/24/2020   Principle Diagnosis:   Polycythemia vera- JAK2 (+)  Current Therapy:    Phlebotomy to maintain hematocrit less than 45%  Aspirin 325 mg p.o. daily-on full dose for cardiac issues     Interim History:  [name] is back for follow-up.  This is the first time I am seeing him.  He is a very nice 69 year old African-American male.  He was sent to [place] because of erythrocytosis and microcytosis.  He also has some thrombocytosis.  We sent off a JAK2 assay on him.  Surprisingly, this came back positive.  He has been phlebotomized.  He was already on full dose aspirin because of cardiac issues.  He had cardiac surgery back in 2019.  He has had no problems with the aspirin.  He has had no problems with headaches.  He has had no pain in the hands or feet.  He has had no issues with his appetite.  He is not a vegetarian.  He does not smoke.  He stopped smoking in 2009.  He does have diabetes.  His wife comes in with him.  She is very helpful.  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  He did a lot of work at [employer].  As far as he knows, there is no kind of blood problems in his family.  Overall, his performance status is ECOG 1.  Medications:  Current Outpatient Medications:  .  acetaminophen (TYLENOL) 500 MG tablet, Take 1,000 mg by mouth every 6 (six) hours as needed for moderate pain or headache., Disp: , Rfl:  .  aspirin EC 325 MG EC tablet, Take 1 tablet (325 mg total) by mouth daily., Disp: , Rfl: 0 .  cetirizine (ZYRTEC) 10 MG tablet, Take 10 mg by mouth daily as needed for allergies. , Disp: , Rfl:  .  Cyanocobalamin (VITAMIN B-12 PO), Take 1 tablet by mouth daily., Disp: , Rfl:  .  dapagliflozin propanediol (FARXIGA) 5 MG TABS tablet, Take 1 tablet (5 mg total) by mouth daily before breakfast., Disp: 90 tablet, Rfl: 3 .  glucose blood (PRODIGY NO CODING  BLOOD GLUC) test strip, Use as instructed to check blood sugar twice a day.  DX  E11.40, Disp: 100 each, Rfl: 1 .  hydrALAZINE (APRESOLINE) 10 MG tablet, Take 1 tablet (10 mg total) by mouth 3 (three) times daily., Disp: 270 tablet, Rfl: 1 .  insulin aspart (NOVOLOG FLEXPEN) 100 UNIT/ML FlexPen, Inject 6 Units into the skin with breakfast, with lunch, and with evening meal., Disp: 15 mL, Rfl: 6 .  insulin detemir (LEVEMIR FLEXTOUCH) 100 UNIT/ML FlexPen, Inject 18 Units into the skin every morning., Disp: 15 mL, Rfl: 6 .  Insulin Pen Needle 32G X 4 MM MISC, 1 Device by Does not apply route as directed., Disp: 100 each, Rfl: 6 .  isosorbide mononitrate (IMDUR) 30 MG 24 hr tablet, Take 0.5 tablets (15 mg total) by mouth daily., Disp: 15 tablet, Rfl: 11 .  metFORMIN (GLUCOPHAGE) 1000 MG tablet, Take 1 tablet (1,000 mg total) by mouth daily., Disp: 90 tablet, Rfl: 1 .  metoprolol succinate (TOPROL-XL) 50 MG 24 hr tablet, Take 50 mg by mouth daily., Disp: , Rfl:  .  rosuvastatin (CRESTOR) 20 MG tablet, Take 1 tablet (20 mg total) by mouth daily., Disp: 90 tablet, Rfl: 0 .  sacubitril-valsartan (ENTRESTO) 49-51 MG, Take 1 tablet by mouth 2 (two) times  daily., Disp: 180 tablet, Rfl: 3 .  spironolactone (ALDACTONE) 25 MG tablet, Take 0.5 tablets (12.5 mg total) by mouth daily., Disp: 45 tablet, Rfl: 3  Allergies:  Allergies  Allergen Reactions  . Lisinopril Other (See Comments)    Hyperkalemia   . Hydrochlorothiazide Hives  . Amlodipine Besylate Other (See Comments)    REACTION: ? caused left axillary nodules/chest flutter    Past Medical History, Surgical history, Social history, and Family History were reviewed and updated.  Review of Systems: Review of Systems  Constitutional: Negative.   HENT:  Negative.   Eyes: Negative.   Respiratory: Negative.   Cardiovascular: Negative.   Gastrointestinal: Negative.   Endocrine: Negative.   Genitourinary: Negative.    Musculoskeletal: Negative.    Skin: Negative.   Neurological: Negative.   Hematological: Negative.   Psychiatric/Behavioral: Negative.     Physical Exam:  height is 5\' 11"  (1.803 m) and weight is 177 lb 12.8 oz (80.6 kg). His oral temperature is 97.9 F (36.6 C). His blood pressure is 124/68 and his pulse is 75. His respiration is 18 and oxygen saturation is 100%.   Wt Readings from Last 3 Encounters:  08/24/20 177 lb 12.8 oz (80.6 kg)  08/23/20 176 lb 3.2 oz (79.9 kg)  08/14/20 181 lb (82.1 kg)    Physical Exam Vitals reviewed.  HENT:     Head: Normocephalic and atraumatic.     Mouth/Throat:     Mouth: Oropharynx is clear and moist.  Eyes:     Extraocular Movements: EOM normal.     Pupils: Pupils are equal, round, and reactive to light.  Cardiovascular:     Rate and Rhythm: Normal rate and regular rhythm.     Heart sounds: Normal heart sounds.  Pulmonary:     Effort: Pulmonary effort is normal.     Breath sounds: Normal breath sounds.  Abdominal:     General: Bowel sounds are normal.     Palpations: Abdomen is soft.  Musculoskeletal:        General: No tenderness, deformity or edema. Normal range of motion.     Cervical back: Normal range of motion.  Lymphadenopathy:     Cervical: No cervical adenopathy.  Skin:    General: Skin is warm and dry.     Findings: No erythema or rash.  Neurological:     Mental Status: He is alert and oriented to person, place, and time.  Psychiatric:        Mood and Affect: Mood and affect normal.        Behavior: Behavior normal.        Thought Content: Thought content normal.        Judgment: Judgment normal.    Lab Results  Component Value Date   WBC 6.2 08/24/2020   HGB 13.2 08/24/2020   HCT 43.2 08/24/2020   MCV 71.9 (L) 08/24/2020   PLT 303 08/24/2020     Chemistry      Component Value Date/Time   NA 141 08/24/2020 0905   NA 142 06/07/2020 1111   K 4.1 08/24/2020 0905   CL 106 08/24/2020 0905   CO2 29 08/24/2020 0905   BUN 14 08/24/2020 0905    BUN 10 06/07/2020 1111   CREATININE 0.96 08/24/2020 0905   CREATININE 0.84 11/30/2013 0856      Component Value Date/Time   CALCIUM 9.3 08/24/2020 0905   ALKPHOS 40 08/24/2020 0905   AST 13 (L) 08/24/2020 0905   ALT 9 08/24/2020  [id number]   BILITOT 0.4 08/24/2020 0905      Impression and Plan: [name] is a very nice 69 year old African-American male.  He has polycythemia vera.  This is by virtue of him having the positive JAK2 assay.  I had to suspect that he is probably iron deficient because of the polycythemia.  He is on full dose aspirin because of his cardiac issues.  I am surprised that his platelet count is come down some nicely.  We not have him on any medications.  He clearly does not need to be phlebotomized today.  We will go ahead and get a ultrasound of his abdomen.  I want a baseline so we can assess his liver and spleen.  For right now, we will just follow him up in 6 weeks.  We will have to see how everything progresses or trends with respect to his platelets.  I would like to get a hemoglobin electrophoresis on him just to make sure that there is no hemoglobinopathy that might contribute to the microcytosis.   Volanda Napoleon, MD 12/30/202111:17 AM

## 2020-08-24 NOTE — Telephone Encounter (Signed)
appts made and printed for pt per 08/24/20 los, aware that u/s has to be approved by ins and they will get a call to sch...aom

## 2020-08-28 ENCOUNTER — Telehealth (HOSPITAL_COMMUNITY): Payer: Self-pay | Admitting: Cardiology

## 2020-08-28 DIAGNOSIS — I5022 Chronic systolic (congestive) heart failure: Secondary | ICD-10-CM

## 2020-08-28 NOTE — Telephone Encounter (Signed)
-----   Message from Dolores Patty, MD sent at 08/19/2020  3:08 PM EST ----- EF < 35%. Please refer for ICD

## 2020-08-28 NOTE — Telephone Encounter (Signed)
Pt aware and voiced understanding Would like to review results in detail at upcoming appt Referral placed

## 2020-09-03 NOTE — Progress Notes (Signed)
ADVANCED HF CLINIC NOTE  Referring Physician: Dr. Servando Salina Primary Care: Wanda Plump, MD  Primary Cardiologist: Dr. Servando Salina  HPI:  [name] Renna Corneliusis a 70 y.o.maleformer smoker (62 py, quit 2012) with COPD, non-obstructive CAD, severe AS s/p bioprosthetic AVR/CABG (LIMA to D1 -> LAD)  in 2019, systolic HF due to NICM (EF 25-30%), OSA, HTN, p.vera with Jak2 mutation and DM2 referred by Dr. Servando Salina for further evaluation of his HF.  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   Echo at time of AVR in 2019 EF 35-40%  Underwent LHC in 9/21 for worsening fatigue and DOE.  LM ok LAD 65% LCX 30% Ramus 65% RCA 20%  Patent but atretic LIMA graft LVEDP 7  cMRI 12/21 1. EF 25% 2.  Normal RV size with mildly decreased systolic function, EF 37%. 3. Minimal LGE   Here for routine f/u. Has been following with HF PharmD for med titration. Imdur recently started and he was unable to tolerate due to HAs. Works as a Music therapist. Denies SOB, CP or edema. Says he gets tired if he is hauling a lot of wood but not bad. Gets a little woozy at times but no syncope or presyncope. SBP 115-125.   PFTs 2019 FEV 1 1.77 (49%) FVC 3.37 (71%) Ratio 53% DLCO 36%  Review of Systems: All systems negative except as listed in HPI above   Past Medical History:  Diagnosis Date  . Abnormal myocardial perfusion study 2006   EF 44% ? inferoseptal ischemia. no cath done  . Benign neoplasm of cecum   . Breast mass, right 03/2008  . Cardiomyopathy (HCC) 07/20/2018   Ejection fraction 4045% in September 2019  . Centrilobular emphysema (HCC) 08/25/2018   02/04/2018-high-res CT- no evidence of interstitial lung disease, severe centrilobular emphysema and mild diffuse bronchial wall thickening suggesting COPD, no calcified pleural plaques no pleural effusion, mild smooth bilateral pleural thickening which is nonspecific  02/04/2018-pulmonary function test- FVC 3.37 (71% predicted), postbronchodilator ratio 50,  postbronchodilator FEV1 54, mid flow reve  . COLONIC POLYPS, HX OF 03/18/2007   Qualifier: Diagnosis of  By: Nena Jordan   . Contact dermatitis 01/31/2013  . COPD (chronic obstructive pulmonary disease) (HCC)    "dx'd 12/2017"  . COPD GOLD II A  08/25/2018   02/04/2018-high-res CT- no evidence of interstitial lung disease, severe centrilobular emphysema and mild diffuse bronchial wall thickening suggesting COPD, no calcified pleural plaques no pleural effusion, mild smooth bilateral pleural thickening which is nonspecific  02/04/2018-pulmonary function test- FVC 3.37 (71% predicted), postbronchodilator ratio 50, postbronchodilator FEV1 54, mid flow reve  . Coronary artery disease   . Critical aortic valve stenosis 01/12/2018   Calculated valve area less than 0.5 cm mean gradient more than 60 mm of mercury  . DEGENERATIVE JOINT DISEASE, BACK 03/18/2007   Qualifier: Diagnosis of  By: Nena Jordan   . Depressed left ventricular ejection fraction 03/28/2020  . Dyspnea on exertion 01/12/2018  . ERECTILE DYSFUNCTION, ORGANIC 02/10/2009   Qualifier: Diagnosis of  By: Nena Jordan   . Heart murmur   . HEMORRHOIDS, INTERNAL 03/18/2007   Qualifier: Diagnosis of  By: Nena Jordan   . Hyperlipemia 10/21/2007   Qualifier: Diagnosis of  By: Nena Jordan   . Hypertension   . Hypoglycemia due to insulin 03/26/2020  . Low back pain   . MYOCARDIAL PERFUSION SCAN, WITH STRESS TEST, ABNORMAL 08/11/2008   Qualifier: Diagnosis of  ByJohnsie Cancel, MD, Rona Ravens   . Nocturnal hypoxemia 08/25/2018   04/09/18 >>> ONO - telephone note>>> Patient's ONO on room air showed that he was less than 88% for more than an hour. Patient needs to be on 2 L at HS.  10/06/2018-overnight oximetry-entire duration was 4 hours and 23 minutes, SPO2 less than 88% for 25 minutes and 32 seconds    . Oxygen deficiency    uses 2L at night  . Paroxysmal atrial fibrillation ([place]) 10/12/2018  . S/P AVR 03/04/2018   AVR 23  mm Edwards Magna Valve - bioprostetic 03/04/2018 LIMA - LAD  . Status post coronary artery bypass graft 03/20/2018   LIMA to LAD during aortic valve replacement surgery 03/04/2018  . Type 2 diabetes mellitus, uncontrolled, with neuropathy ([place]) 05/20/2008   Qualifier: Diagnosis of  By: [name]     Current Outpatient Medications  Medication Sig Dispense Refill  . acetaminophen (TYLENOL) 500 MG tablet Take 1,000 mg by mouth every 6 (six) hours as needed for moderate pain or headache.    Marland Kitchen aspirin EC 325 MG EC tablet Take 1 tablet (325 mg total) by mouth daily.  0  . cetirizine (ZYRTEC) 10 MG tablet Take 10 mg by mouth daily as needed for allergies.     . Cyanocobalamin (VITAMIN B-12 PO) Take 1 tablet by mouth daily.    . dapagliflozin propanediol (FARXIGA) 5 MG TABS tablet Take 1 tablet (5 mg total) by mouth daily before breakfast. 90 tablet 3  . glucose blood (PRODIGY NO CODING BLOOD GLUC) test strip Use as instructed to check blood sugar twice a day.  DX  E11.40 100 each 1  . hydrALAZINE (APRESOLINE) 10 MG tablet Take 1 tablet (10 mg total) by mouth 3 (three) times daily. 270 tablet 1  . insulin aspart (NOVOLOG FLEXPEN) 100 UNIT/ML FlexPen Inject 6 Units into the skin with breakfast, with lunch, and with evening meal. 15 mL 6  . insulin detemir (LEVEMIR FLEXTOUCH) 100 UNIT/ML FlexPen Inject 18 Units into the skin every morning. 15 mL 6  . Insulin Pen Needle 32G X 4 MM MISC 1 Device by Does not apply route as directed. 100 each 6  . isosorbide mononitrate (IMDUR) 30 MG 24 hr tablet Take 0.5 tablets (15 mg total) by mouth daily. 15 tablet 11  . metFORMIN (GLUCOPHAGE) 1000 MG tablet Take 1 tablet (1,000 mg total) by mouth daily. 90 tablet 1  . metoprolol succinate (TOPROL-XL) 50 MG 24 hr tablet Take 50 mg by mouth daily.    . rosuvastatin (CRESTOR) 20 MG tablet Take 1 tablet (20 mg total) by mouth daily. 90 tablet 0  . sacubitril-valsartan (ENTRESTO) 49-51 MG Take 1 tablet by mouth 2 (two)  times daily. 180 tablet 3  . spironolactone (ALDACTONE) 25 MG tablet Take 0.5 tablets (12.5 mg total) by mouth daily. 45 tablet 3   No current facility-administered medications for this encounter.    Allergies  Allergen Reactions  . Lisinopril Other (See Comments)    Hyperkalemia   . Hydrochlorothiazide Hives  . Amlodipine Besylate Other (See Comments)    REACTION: ? caused left axillary nodules/chest flutter      Social History   Socioeconomic History  . Marital status: Married    Spouse name: Not on file  . Number of children: Not on file  . Years of education: Not on file  . Highest education level: Not on file  Occupational History  . Not on file  Tobacco Use  . Smoking status: Former Smoker    Packs/day: 1.50    Years: 41.00    Pack years: 61.50    Types: Cigarettes    Quit date: 08/26/2010    Years since quitting: 10.0  . Smokeless tobacco: Never Used  . Tobacco comment: Quit 2012  Vaping Use  . Vaping Use: Never used  Substance and Sexual Activity  . Alcohol use: Yes    Comment: occasionally  . Drug use: Not Currently  . Sexual activity: Not Currently  Other Topics Concern  . 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 Difficulty of Paying Living Expenses: Not hard at all  Food Insecurity: No Food Insecurity  . Worried About Charity fundraiser in the Last Year: Never true  . Ran Out of Food in the Last Year: Never true  Transportation Needs: No Transportation Needs  . Lack of Transportation (Medical): No  . Lack of Transportation (Non-Medical): No  Physical Activity: Not on file  Stress: Not on file  Social Connections: Not on file  Intimate Partner Violence: Not on file      Family History  Problem Relation Age of Onset  . Melanoma Father 50        deceased secondary to melanoma  . Alzheimer's disease Father   . Diabetes Father   . Hypertension Mother        alive -52  . Colon cancer Neg Hx   . Esophageal cancer Neg Hx   . Colon polyps Neg Hx   . Stomach cancer Neg Hx     Vitals:   09/05/20 1148  BP: 138/88  Pulse: 80  SpO2: 100%  Weight: 82.1 kg (181 lb)    PHYSICAL EXAM: General:  Well appearing. No resp difficulty HEENT: normal Neck: supple. no JVD. Carotids 2+ bilat; no bruits. No lymphadenopathy or thryomegaly appreciated. Cor: PMI nondisplaced. Regular rate & rhythm. No rubs, gallops or murmurs. Lungs: clear decreased BS throughout Abdomen: soft, nontender, nondistended. No hepatosplenomegaly. No bruits or masses. Good bowel sounds. Extremities: no cyanosis, clubbing, rash, edema Neuro: alert & orientedx3, cranial nerves grossly intact. moves all 4 extremities w/o difficulty. Affect pleasant  ASSESSMENT & PLAN:  1. Chronic systolic HF - primarily NICM. Had LIMA to LAD at time of AVR/CABG 2019 but on cath in 9/21 LAD lesion 65% and LIMA atretic - echo 2019 EF 35-40% - Echo 8/21 EF 25-30% - cMRI 12/21 - LVEF 25% mild RV dysfunction. Minimal LGE - Doing well NYHA II - Increase to Entresto 97/103 bid - Continue Toprol 50 daily - Continue spiro 12.5 daily - Continue hydralazine 10 tid (failed Imdur due to HAs) - Continue Farxiga 5mg  daily - Has been referred for ICD by Dr. Harriet Masson but he would prefer to defer this for now and see if EF gets better with GDMT titration. Repeat echo in 2-3 months.  2. Severe AS - s/p bioprosthetic AVR 2019 - cath 9/21 with no gradient - reinforced need for SBE prophylaxis  3. CAD - Had LIMA to LAD at time of AVR/CABG 2019 but on cath in 9/21 LAD lesion 65% and LIMA atretic - Continue ASA/statin - No s/s  angina  4. COPD, Gold II - followed by Pulmonary  - wears 2L O2 at night  - we did hall walk in clinic and sats > 90%  5. DM2 - continue Rhodia Albright,  MD  10:33 PM

## 2020-09-05 ENCOUNTER — Ambulatory Visit (HOSPITAL_COMMUNITY)
Admission: RE | Admit: 2020-09-05 | Discharge: 2020-09-05 | Disposition: A | Discharge status: Home / Self Care | Payer: Medicare Other | Source: Ambulatory Visit | Attending: Internal Medicine | Admitting: Internal Medicine

## 2020-09-05 ENCOUNTER — Encounter (HOSPITAL_COMMUNITY): Payer: Self-pay | Admitting: Internal Medicine

## 2020-09-05 ENCOUNTER — Other Ambulatory Visit: Payer: Self-pay

## 2020-09-05 VITALS — BP 138/88 | HR 80 | Wt 181.0 lb

## 2020-09-05 DIAGNOSIS — I110 Hypertensive heart disease with heart failure: Secondary | ICD-10-CM | POA: Insufficient documentation

## 2020-09-05 DIAGNOSIS — I428 Other cardiomyopathies: Secondary | ICD-10-CM | POA: Diagnosis not present

## 2020-09-05 DIAGNOSIS — Z79899 Other long term (current) drug therapy: Secondary | ICD-10-CM | POA: Diagnosis not present

## 2020-09-05 DIAGNOSIS — I5022 Chronic systolic (congestive) heart failure: Secondary | ICD-10-CM

## 2020-09-05 DIAGNOSIS — Z833 Family history of diabetes mellitus: Secondary | ICD-10-CM | POA: Diagnosis not present

## 2020-09-05 DIAGNOSIS — Z820 Family history of epilepsy and other diseases of the nervous system: Secondary | ICD-10-CM | POA: Diagnosis not present

## 2020-09-05 DIAGNOSIS — E785 Hyperlipidemia, unspecified: Secondary | ICD-10-CM | POA: Diagnosis not present

## 2020-09-05 DIAGNOSIS — I480 Paroxysmal atrial fibrillation: Secondary | ICD-10-CM | POA: Insufficient documentation

## 2020-09-05 DIAGNOSIS — E1165 Type 2 diabetes mellitus with hyperglycemia: Secondary | ICD-10-CM

## 2020-09-05 DIAGNOSIS — G4733 Obstructive sleep apnea (adult) (pediatric): Secondary | ICD-10-CM | POA: Insufficient documentation

## 2020-09-05 DIAGNOSIS — Z7982 Long term (current) use of aspirin: Secondary | ICD-10-CM | POA: Diagnosis not present

## 2020-09-05 DIAGNOSIS — Z952 Presence of prosthetic heart valve: Secondary | ICD-10-CM | POA: Diagnosis not present

## 2020-09-05 DIAGNOSIS — J449 Chronic obstructive pulmonary disease, unspecified: Secondary | ICD-10-CM | POA: Insufficient documentation

## 2020-09-05 DIAGNOSIS — Z888 Allergy status to other drugs, medicaments and biological substances status: Secondary | ICD-10-CM | POA: Insufficient documentation

## 2020-09-05 DIAGNOSIS — Z953 Presence of xenogenic heart valve: Secondary | ICD-10-CM | POA: Insufficient documentation

## 2020-09-05 DIAGNOSIS — I2510 Atherosclerotic heart disease of native coronary artery without angina pectoris: Secondary | ICD-10-CM | POA: Diagnosis not present

## 2020-09-05 DIAGNOSIS — E1140 Type 2 diabetes mellitus with diabetic neuropathy, unspecified: Secondary | ICD-10-CM | POA: Diagnosis not present

## 2020-09-05 DIAGNOSIS — Z794 Long term (current) use of insulin: Secondary | ICD-10-CM | POA: Insufficient documentation

## 2020-09-05 DIAGNOSIS — Z87891 Personal history of nicotine dependence: Secondary | ICD-10-CM | POA: Diagnosis not present

## 2020-09-05 DIAGNOSIS — Z951 Presence of aortocoronary bypass graft: Secondary | ICD-10-CM | POA: Diagnosis not present

## 2020-09-05 DIAGNOSIS — Z8249 Family history of ischemic heart disease and other diseases of the circulatory system: Secondary | ICD-10-CM | POA: Diagnosis not present

## 2020-09-05 DIAGNOSIS — IMO0002 Reserved for concepts with insufficient information to code with codable children: Secondary | ICD-10-CM

## 2020-09-05 LAB — BASIC METABOLIC PANEL
Anion gap: 8 (ref 5–15)
BUN: 10 mg/dL (ref 8–23)
CO2: 25 mmol/L (ref 22–32)
Calcium: 9.4 mg/dL (ref 8.9–10.3)
Chloride: 106 mmol/L (ref 98–111)
Creatinine, Ser: 0.77 mg/dL (ref 0.61–1.24)
GFR, Estimated: >60 mL/min (ref >60)
Glucose, Bld: 137 mg/dL — ABNORMAL HIGH (ref 70–99)
Potassium: 4.2 mmol/L (ref 3.5–5.1)
Sodium: 139 mmol/L (ref 135–145)

## 2020-09-05 LAB — BRAIN NATRIURETIC PEPTIDE: B Natriuretic Peptide: 76.4 pg/mL (ref 0.0–100.0)

## 2020-09-05 MED ORDER — ENTRESTO 97-103 MG PO TABS: 1.0000 | ORAL_TABLET | Freq: Two times a day (BID) | ORAL | 4 refills | Status: DC

## 2020-09-05 NOTE — Patient Instructions (Signed)
Increase Entresto to 97/103 ( 1 tablet) Twice daily   Labs done today, your results will be available in MyChart, we will contact you for abnormal readings.   Your physician has requested that you have an echocardiogram. Echocardiography is a painless test that uses sound waves to create images of your heart. It provides your doctor with information about the size and shape of your heart and how well your heart's chambers and valves are working. This procedure takes approximately one hour. There are no restrictions for this procedure.   Your physician recommends that you schedule a follow-up appointment in: 3 months with echocardiogram  .If you have any questions or concerns before your next appointment please send [place] a message through [place] or call our office at [phone].    TO LEAVE A MESSAGE FOR THE NURSE SELECT OPTION 2, PLEASE LEAVE A MESSAGE INCLUDING: . YOUR NAME . DATE OF BIRTH . CALL BACK NUMBER . REASON FOR CALL**this is important as we prioritize the call backs  [place] AS LONG AS YOU CALL BEFORE 4:00 PM   At the [facility], you and your health needs are our priority. As part of our continuing mission to provide you with exceptional heart care, we have created designated Provider Care Teams. These Care Teams include your primary Cardiologist (physician) and Advanced Practice Providers (APPs- Physician Assistants and Nurse Practitioners) who all work together to provide you with the care you need, when you need it.   You may see any of the following providers on your designated Care Team at your next follow up: Marland Kitchen Dr Glori Bickers . Dr Loralie Champagne . Darrick Grinder, NP . Lyda Jester, PA . Audry Riles, PharmD   Please be sure to bring in all your medications bottles to every appointment.

## 2020-09-06 NOTE — Addendum Note (Signed)
Encounter addended by: Jolaine Artist, MD on: 09/06/2020 11:47 PM  Actions taken: Level of Service modified, Visit diagnoses modified

## 2020-09-07 ENCOUNTER — Ambulatory Visit (HOSPITAL_BASED_OUTPATIENT_CLINIC_OR_DEPARTMENT_OTHER)
Admission: RE | Admit: 2020-09-07 | Discharge: 2020-09-07 | Disposition: A | Discharge status: Home / Self Care | Payer: Medicare Other | Source: Ambulatory Visit | Attending: Hematology & Oncology | Admitting: Hematology & Oncology

## 2020-09-07 ENCOUNTER — Other Ambulatory Visit: Payer: Self-pay

## 2020-09-07 ENCOUNTER — Encounter: Payer: Self-pay | Admitting: *Deleted

## 2020-09-07 DIAGNOSIS — D45 Polycythemia vera: Secondary | ICD-10-CM | POA: Diagnosis not present

## 2020-09-07 DIAGNOSIS — N281 Cyst of kidney, acquired: Secondary | ICD-10-CM | POA: Diagnosis not present

## 2020-09-12 ENCOUNTER — Ambulatory Visit: Payer: Medicare Other | Admitting: Internal Medicine

## 2020-09-13 ENCOUNTER — Other Ambulatory Visit: Payer: Self-pay

## 2020-09-13 ENCOUNTER — Encounter: Payer: Self-pay | Admitting: Internal Medicine

## 2020-09-13 ENCOUNTER — Ambulatory Visit (INDEPENDENT_AMBULATORY_CARE_PROVIDER_SITE_OTHER): Payer: Medicare Other | Admitting: Internal Medicine

## 2020-09-13 VITALS — BP 128/79 | HR 79 | Temp 98.0°F | Resp 18 | Ht 71.0 in | Wt 181.4 lb

## 2020-09-13 DIAGNOSIS — I2510 Atherosclerotic heart disease of native coronary artery without angina pectoris: Secondary | ICD-10-CM | POA: Diagnosis not present

## 2020-09-13 DIAGNOSIS — D45 Polycythemia vera: Secondary | ICD-10-CM

## 2020-09-13 DIAGNOSIS — I420 Dilated cardiomyopathy: Secondary | ICD-10-CM | POA: Diagnosis not present

## 2020-09-13 DIAGNOSIS — E785 Hyperlipidemia, unspecified: Secondary | ICD-10-CM

## 2020-09-13 LAB — LIPID PANEL
Cholesterol: 115 mg/dL (ref 0–200)
HDL: 44.3 mg/dL (ref >39.00)
LDL Cholesterol: 56 mg/dL (ref 0–99)
NonHDL: 70.27
Total CHOL/HDL Ratio: 3
Triglycerides: 69 mg/dL (ref 0.0–149.0)
VLDL: 13.8 mg/dL (ref 0.0–40.0)

## 2020-09-13 NOTE — Progress Notes (Unsigned)
Subjective:    Patient ID: [name], male    DOB: [birth year], 70 y.o.   MRN: [id number]  DOS:  09/13/2020 Type of visit - description: Routine follow-up, here with his wife Since the last office visit saw cardiology, hematology, notes reviewed. Doing well   Review of Systems Denies chest pain or difficulty breathing No cough or sputum production  Past Medical History:  Diagnosis Date  . Abnormal myocardial perfusion study 2006   EF 44% ? inferoseptal ischemia. no cath done  . Benign neoplasm of cecum   . Breast mass, right 03/2008  . Cardiomyopathy ([place]) 07/20/2018   Ejection fraction 4045% in September 2019  . Centrilobular emphysema ([place]) 08/25/2018   02/04/2018-high-res CT- no evidence of interstitial lung disease, severe centrilobular emphysema and mild diffuse bronchial wall thickening suggesting COPD, no calcified pleural plaques no pleural effusion, mild smooth bilateral pleural thickening which is nonspecific  02/04/2018-pulmonary function test- FVC 3.37 (71% predicted), postbronchodilator ratio 50, postbronchodilator FEV1 54, mid flow reve  . COLONIC POLYPS, HX OF 03/18/2007   Qualifier: Diagnosis of  By: [name]   . Contact dermatitis 01/31/2013  . COPD (chronic obstructive pulmonary disease) ([place])    "dx'd 12/2017"  . COPD GOLD II A  08/25/2018   02/04/2018-high-res CT- no evidence of interstitial lung disease, severe centrilobular emphysema and mild diffuse bronchial wall thickening suggesting COPD, no calcified pleural plaques no pleural effusion, mild smooth bilateral pleural thickening which is nonspecific  02/04/2018-pulmonary function test- FVC 3.37 (71% predicted), postbronchodilator ratio 50, postbronchodilator FEV1 54, mid flow reve  . Coronary artery disease   . Critical aortic valve stenosis 01/12/2018   Calculated valve area less than 0.5 cm mean gradient more than 60 mm of mercury  . DEGENERATIVE JOINT DISEASE, BACK 03/18/2007   Qualifier:  Diagnosis of  By: [name]   . Depressed left ventricular ejection fraction 03/28/2020  . Dyspnea on exertion 01/12/2018  . ERECTILE DYSFUNCTION, ORGANIC 02/10/2009   Qualifier: Diagnosis of  By: [name]   . Heart murmur   . HEMORRHOIDS, INTERNAL 03/18/2007   Qualifier: Diagnosis of  By: [name]   . Hyperlipemia 10/21/2007   Qualifier: Diagnosis of  By: [name]   . Hypertension   . Hypoglycemia due to insulin 03/26/2020  . Low back pain   . MYOCARDIAL PERFUSION SCAN, WITH STRESS TEST, ABNORMAL 08/11/2008   Qualifier: Diagnosis of  By: Johnsie Cancel, MD, Rona Ravens   . Nocturnal hypoxemia 08/25/2018   04/09/18 >>> ONO - telephone note>>> Patient's ONO on room air showed that he was less than 88% for more than an hour. Patient needs to be on 2 L at HS.  10/06/2018-overnight oximetry-entire duration was 4 hours and 23 minutes, SPO2 less than 88% for 25 minutes and 32 seconds    . Oxygen deficiency    uses 2L at night  . Paroxysmal atrial fibrillation ([place]) 10/12/2018  . S/P AVR 03/04/2018   AVR 23 mm Edwards Magna Valve - bioprostetic 03/04/2018 LIMA - LAD  . Status post coronary artery bypass graft 03/20/2018   LIMA to LAD during aortic valve replacement surgery 03/04/2018  . Type 2 diabetes mellitus, uncontrolled, with neuropathy ([place]) 05/20/2008   Qualifier: Diagnosis of  By: [name]     Past Surgical History:  Procedure Laterality Date  . ABDOMINAL AORTOGRAM N/A 01/13/2018   Procedure: ABDOMINAL AORTOGRAM;  Surgeon: Troy Sine, MD;  Location: [facility];  Service: Cardiovascular;  Laterality: N/A;  . AORTIC VALVE REPLACEMENT N/A 03/04/2018   Procedure: AORTIC VALVE REPLACEMENT (AVR) 8mm Edwards Magna Ease Tissue Valve.;  Surgeon: Melrose Nakayama, MD;  Location: [facility];  Service: Open Heart Surgery;  Laterality: N/A;  . CARDIAC VALVE REPLACEMENT    . COLONOSCOPY W/ POLYPECTOMY    . COLONOSCOPY WITH PROPOFOL N/A 02/22/2020    Procedure: COLONOSCOPY WITH PROPOFOL;  Surgeon: Doran Stabler, MD;  Location: [facility];  Service: Gastroenterology;  Laterality: N/A;  . CORONARY ARTERY BYPASS GRAFT N/A 03/04/2018   Procedure: CORONARY ARTERY BYPASS GRAFTING (CABG) x1:  LIMA to LAD.;  Surgeon: Melrose Nakayama, MD;  Location: [facility];  Service: Open Heart Surgery;  Laterality: N/A;  . HEMOSTASIS CLIP PLACEMENT  02/22/2020   Procedure: HEMOSTASIS CLIP PLACEMENT;  Surgeon: Doran Stabler, MD;  Location: [facility];  Service: Gastroenterology;;  . Cruzita Lederer PRESSURE WIRE/FFR STUDY N/A 05/10/2020   Procedure: INTRAVASCULAR PRESSURE WIRE/FFR STUDY;  Surgeon: Sherren Mocha, MD;  Location: [facility];  Service: Cardiovascular;  Laterality: N/A;  . LEFT HEART CATH AND CORS/GRAFTS ANGIOGRAPHY N/A 05/10/2020   Procedure: LEFT HEART CATH AND CORS/GRAFTS ANGIOGRAPHY;  Surgeon: Sherren Mocha, MD;  Location: [facility];  Service: Cardiovascular;  Laterality: N/A;  . LIPOMA EXCISION Left 07/2008    "back of shoulder" Dr Gershon Crane  . POLYPECTOMY  02/22/2020   Procedure: POLYPECTOMY;  Surgeon: Doran Stabler, MD;  Location: [facility];  Service: Gastroenterology;;  . RIGHT/LEFT HEART CATH AND CORONARY ANGIOGRAPHY N/A 01/13/2018   Procedure: RIGHT/LEFT HEART CATH AND CORONARY ANGIOGRAPHY;  Surgeon: Troy Sine, MD;  Location: [facility];  Service: Cardiovascular;  Laterality: N/A;  . TEE WITHOUT CARDIOVERSION N/A 03/04/2018   Procedure: TRANSESOPHAGEAL ECHOCARDIOGRAM (TEE);  Surgeon: Melrose Nakayama, MD;  Location: [facility];  Service: Open Heart Surgery;  Laterality: N/A;  . TONSILLECTOMY    . WISDOM TOOTH EXTRACTION      Allergies as of 09/13/2020      Reactions   Lisinopril Other (See Comments)   Hyperkalemia   Hydrochlorothiazide Hives   Amlodipine Besylate Other (See Comments)   REACTION: ? caused left axillary nodules/chest flutter      Medication List       Accurate as of  September 13, 2020 11:59 PM. If you have any questions, ask your nurse or doctor.        acetaminophen 500 MG tablet Commonly known as: TYLENOL Take 1,000 mg by mouth every 6 (six) hours as needed for moderate pain or headache.   aspirin 325 MG EC tablet Take 1 tablet (325 mg total) by mouth daily.   cetirizine 10 MG tablet Commonly known as: ZYRTEC Take 10 mg by mouth daily as needed for allergies.   dapagliflozin propanediol 5 MG Tabs tablet Commonly known as: FARXIGA Take 1 tablet (5 mg total) by mouth daily before breakfast.   Entresto 97-103 MG Generic drug: sacubitril-valsartan Take 1 tablet by mouth 2 (two) times daily.   glucose blood test strip Commonly known as: Prodigy No Coding Blood Gluc Use as instructed to check blood sugar twice a day.  DX  E11.40   hydrALAZINE 10 MG tablet Commonly known as: APRESOLINE Take 1 tablet (10 mg total) by mouth 3 (three) times daily.   Insulin Pen Needle 32G X 4 MM Misc 1 Device by Does not apply route as directed.   Levemir FlexTouch 100  UNIT/ML FlexPen Generic drug: insulin detemir Inject 18 Units into the skin every morning.   metFORMIN 1000 MG tablet Commonly known as: GLUCOPHAGE Take 1 tablet (1,000 mg total) by mouth daily.   metoprolol succinate 50 MG 24 hr tablet Commonly known as: TOPROL-XL Take 50 mg by mouth daily.   NovoLOG FlexPen 100 UNIT/ML FlexPen Generic drug: insulin aspart Inject 6 Units into the skin with breakfast, with lunch, and with evening meal.   rosuvastatin 20 MG tablet Commonly known as: CRESTOR Take 1 tablet (20 mg total) by mouth daily.   VITAMIN B-12 PO Take 1 tablet by mouth daily.          Objective:   Physical Exam BP 128/79 (BP Location: Right Arm, Patient Position: Sitting, Cuff Size: Normal)   Pulse 79   Temp 98 F (36.7 C) (Oral)   Resp 18   Ht 5\' 11"  (1.803 m)   Wt 181 lb 6 oz (82.3 kg)   SpO2 95%   BMI 25.30 kg/m  General:   Well developed, NAD, BMI  noted. HEENT:  Normocephalic . Face symmetric, atraumatic Lungs:  CTA B Normal respiratory effort, no intercostal retractions, no accessory muscle use. Heart: RRR,  no murmur.  Lower extremities: no pretibial edema bilaterally  Skin: Not pale. Not jaundice Neurologic:  alert & oriented X3.  Speech normal, gait appropriate for age and unassisted Psych--  Cognition and judgment appear intact.  Cooperative with normal attention span and concentration.  Behavior appropriate. No anxious or depressed appearing.      Assessment     Assessment (new patient 03/2020) DM HTN CAD, CABG and aortic valve replacement due to stenosis on 02/2018, cardiomyopathy, cath 05/10/2020, Rx medical treatment COPD Polycythemia vera, + JAK2 Dx [id number].  PLAN:  DM: To see Endo soon, on Farxiga, metformin, Levemir 18 units, NPH 6 units with meals. High cholesterol: On Crestor, last LFTs normal, check FLP CAD, cardiomyopathy: Follow-up by cardiology and the advanced heart failure clinic   Most recent EF: 25%.  They are considering a defibrillator.  Was intolerant to isosorbide due to headaches Polycythemia vera: Dx recently, under the care of hematology, next visit with them by next month. Preventive care: Had COVID-vaccine x3 and a flu shot RTC 04/2021 CPX  This visit occurred during the SARS-CoV-2 public health emergency.  Safety protocols were in place, including screening questions prior to the visit, additional usage of staff PPE, and extensive cleaning of exam room while observing appropriate contact time as indicated for disinfecting solutions.

## 2020-09-13 NOTE — Patient Instructions (Addendum)
Per our records you are due for an eye exam. Please contact your eye doctor to schedule an appointment. Please have them send copies of your office visit notes to [place]. Our fax number is [phone].   Continue checking your blood pressures and blood sugars regularly.  GO TO THE LAB : Get the blood work     [place], [place] Come back for physical exam by 04-2021

## 2020-09-13 NOTE — Progress Notes (Unsigned)
Pre visit review using our clinic review tool, if applicable. No additional management support is needed unless otherwise documented below in the visit note. 

## 2020-09-14 DIAGNOSIS — H5203 Hypermetropia, bilateral: Secondary | ICD-10-CM | POA: Diagnosis not present

## 2020-09-14 DIAGNOSIS — H524 Presbyopia: Secondary | ICD-10-CM | POA: Diagnosis not present

## 2020-09-14 DIAGNOSIS — E113291 Type 2 diabetes mellitus with mild nonproliferative diabetic retinopathy without macular edema, right eye: Secondary | ICD-10-CM | POA: Diagnosis not present

## 2020-09-14 LAB — HM DIABETES EYE EXAM

## 2020-09-14 NOTE — Assessment & Plan Note (Signed)
DM: To see Endo soon, on Farxiga, metformin, Levemir 18 units, NPH 6 units with meals. High cholesterol: On Crestor, last LFTs normal, check FLP CAD, cardiomyopathy: Follow-up by cardiology and the advanced heart failure clinic   Most recent EF: 25%.  They are considering a defibrillator.  Was intolerant to isosorbide due to headaches Polycythemia vera: Dx recently, under the care of hematology, next visit with them by next month. Preventive care: Had COVID-vaccine x3 and a flu shot RTC 04/2021 CPX

## 2020-09-28 ENCOUNTER — Inpatient Hospital Stay (HOSPITAL_BASED_OUTPATIENT_CLINIC_OR_DEPARTMENT_OTHER): Payer: Medicare Other | Admitting: Hematology & Oncology

## 2020-09-28 ENCOUNTER — Inpatient Hospital Stay: Payer: Medicare Other | Attending: Hematology & Oncology

## 2020-09-28 ENCOUNTER — Inpatient Hospital Stay: Payer: Medicare Other

## 2020-09-28 ENCOUNTER — Encounter: Payer: Self-pay | Admitting: Hematology & Oncology

## 2020-09-28 ENCOUNTER — Telehealth: Payer: Self-pay | Admitting: *Deleted

## 2020-09-28 ENCOUNTER — Other Ambulatory Visit: Payer: Self-pay

## 2020-09-28 VITALS — BP 90/54 | HR 78

## 2020-09-28 VITALS — BP 107/67 | HR 81 | Temp 98.0°F | Resp 20 | Wt 176.0 lb

## 2020-09-28 DIAGNOSIS — Z794 Long term (current) use of insulin: Secondary | ICD-10-CM | POA: Diagnosis not present

## 2020-09-28 DIAGNOSIS — I2510 Atherosclerotic heart disease of native coronary artery without angina pectoris: Secondary | ICD-10-CM

## 2020-09-28 DIAGNOSIS — Z888 Allergy status to other drugs, medicaments and biological substances status: Secondary | ICD-10-CM | POA: Diagnosis not present

## 2020-09-28 DIAGNOSIS — Z7189 Other specified counseling: Secondary | ICD-10-CM

## 2020-09-28 DIAGNOSIS — D45 Polycythemia vera: Secondary | ICD-10-CM | POA: Insufficient documentation

## 2020-09-28 DIAGNOSIS — E119 Type 2 diabetes mellitus without complications: Secondary | ICD-10-CM | POA: Diagnosis not present

## 2020-09-28 DIAGNOSIS — Z7982 Long term (current) use of aspirin: Secondary | ICD-10-CM | POA: Insufficient documentation

## 2020-09-28 DIAGNOSIS — Z79899 Other long term (current) drug therapy: Secondary | ICD-10-CM | POA: Insufficient documentation

## 2020-09-28 LAB — CBC WITH DIFFERENTIAL (CANCER CENTER ONLY)
Abs Immature Granulocytes: 0.1 10*3/uL — ABNORMAL HIGH (ref 0.00–0.07)
Basophils Absolute: 0 10*3/uL (ref 0.0–0.1)
Basophils Relative: 1 %
Eosinophils Absolute: 0.2 10*3/uL (ref 0.0–0.5)
Eosinophils Relative: 2 %
HCT: 45.6 % (ref 39.0–52.0)
Hemoglobin: 13.9 g/dL (ref 13.0–17.0)
Immature Granulocytes: 1 %
Lymphocytes Relative: 29 %
Lymphs Abs: 2.2 10*3/uL (ref 0.7–4.0)
MCH: 20.5 pg — ABNORMAL LOW (ref 26.0–34.0)
MCHC: 30.5 g/dL (ref 30.0–36.0)
MCV: 67.4 fL — ABNORMAL LOW (ref 80.0–100.0)
Monocytes Absolute: 0.9 10*3/uL (ref 0.1–1.0)
Monocytes Relative: 11 %
Neutro Abs: 4.2 10*3/uL (ref 1.7–7.7)
Neutrophils Relative %: 56 %
Platelet Count: 330 10*3/uL (ref 150–400)
RBC: 6.77 MIL/uL — ABNORMAL HIGH (ref 4.22–5.81)
RDW: 18.1 % — ABNORMAL HIGH (ref 11.5–15.5)
WBC Count: 7.6 10*3/uL (ref 4.0–10.5)
nRBC: 0 % (ref 0.0–0.2)

## 2020-09-28 LAB — CMP (CANCER CENTER ONLY)
ALT: 8 U/L (ref 0–44)
AST: 14 U/L — ABNORMAL LOW (ref 15–41)
Albumin: 4.3 g/dL (ref 3.5–5.0)
Alkaline Phosphatase: 44 U/L (ref 38–126)
Anion gap: 6 (ref 5–15)
BUN: 16 mg/dL (ref 8–23)
CO2: 29 mmol/L (ref 22–32)
Calcium: 9.9 mg/dL (ref 8.9–10.3)
Chloride: 105 mmol/L (ref 98–111)
Creatinine: 0.95 mg/dL (ref 0.61–1.24)
GFR, Estimated: >60 mL/min (ref >60)
Glucose, Bld: 73 mg/dL (ref 70–99)
Potassium: 4.3 mmol/L (ref 3.5–5.1)
Sodium: 140 mmol/L (ref 135–145)
Total Bilirubin: 0.5 mg/dL (ref 0.3–1.2)
Total Protein: 7.4 g/dL (ref 6.5–8.1)

## 2020-09-28 LAB — RETICULOCYTES
Immature Retic Fract: 11.6 % (ref 2.3–15.9)
RBC.: 6.75 MIL/uL — ABNORMAL HIGH (ref 4.22–5.81)
Retic Count, Absolute: 83 10*3/uL (ref 19.0–186.0)
Retic Ct Pct: 1.2 % (ref 0.4–3.1)

## 2020-09-28 LAB — FERRITIN: Ferritin: 9 ng/mL — ABNORMAL LOW (ref 24–336)

## 2020-09-28 LAB — SAVE SMEAR(SSMR), FOR PROVIDER SLIDE REVIEW

## 2020-09-28 LAB — IRON AND TIBC
Iron: 27 ug/dL — ABNORMAL LOW (ref 42–163)
Saturation Ratios: 7 % — ABNORMAL LOW (ref 20–55)
TIBC: 408 ug/dL (ref 202–409)
UIBC: 380 ug/dL — ABNORMAL HIGH (ref 117–376)

## 2020-09-28 LAB — LACTATE DEHYDROGENASE: LDH: 150 U/L (ref 98–192)

## 2020-09-28 NOTE — Progress Notes (Signed)
Hematology and Oncology Follow Up Visit  [name] [id number] [birth year] 70 y.o. 09/28/2020   Principle Diagnosis:   Polycythemia vera- JAK2 (+)  Current Therapy:    Phlebotomy to maintain hematocrit less than 45%  Aspirin 325 mg p.o. daily-on full dose for cardiac issues     Interim History:  [name] is back for follow-up.  We last saw him back in late December.  We did go ahead and do a ultrasound of [name] abdomen.  This, thankfully, did not show any splenomegaly.  There is no problems with [name] liver.  There is no adenopathy.  He is feeling well.  He has not been phlebotomized for 3 months.  We will go ahead and phlebotomize him today.  He has had no problems with Covid.  He and [name] are being very cautious.  There is been no chest pain.  He has had no cough or shortness of breath.  He has had no issues with rashes.  He does have diabetes.  [name] blood sugars are very well controlled today.  He is on aspirin.  There is no dyspepsia.  Overall, [name] performance status is ECOG 1.      Medications:  Current Outpatient Medications:  .  aspirin EC 325 MG EC tablet, Take 1 tablet (325 mg total) by mouth daily., Disp: , Rfl: 0 .  Cyanocobalamin (VITAMIN B-12 PO), Take 1 tablet by mouth daily., Disp: , Rfl:  .  dapagliflozin propanediol (FARXIGA) 5 MG TABS tablet, Take 1 tablet (5 mg total) by mouth daily before breakfast., Disp: 90 tablet, Rfl: 3 .  glucose blood (PRODIGY NO CODING BLOOD GLUC) test strip, Use as instructed to check blood sugar twice a day.  DX  E11.40, Disp: 100 each, Rfl: 1 .  hydrALAZINE (APRESOLINE) 10 MG tablet, Take 1 tablet (10 mg total) by mouth 3 (three) times daily., Disp: 270 tablet, Rfl: 1 .  insulin aspart (NOVOLOG FLEXPEN) 100 UNIT/ML FlexPen, Inject 6 Units into the skin with breakfast, with lunch, and with evening meal., Disp: 15 mL, Rfl: 6 .  insulin detemir (LEVEMIR FLEXTOUCH) 100 UNIT/ML FlexPen, Inject 18 Units into the skin every  morning., Disp: 15 mL, Rfl: 6 .  Insulin Pen Needle 32G X 4 MM MISC, 1 Device by Does not apply route as directed., Disp: 100 each, Rfl: 6 .  metFORMIN (GLUCOPHAGE) 1000 MG tablet, Take 1 tablet (1,000 mg total) by mouth daily., Disp: 90 tablet, Rfl: 1 .  metoprolol succinate (TOPROL-XL) 50 MG 24 hr tablet, Take 50 mg by mouth daily., Disp: , Rfl:  .  rosuvastatin (CRESTOR) 20 MG tablet, Take 1 tablet (20 mg total) by mouth daily., Disp: 90 tablet, Rfl: 0 .  sacubitril-valsartan (ENTRESTO) 97-103 MG, Take 1 tablet by mouth 2 (two) times daily., Disp: 60 tablet, Rfl: 4 .  acetaminophen (TYLENOL) 500 MG tablet, Take 1,000 mg by mouth every 6 (six) hours as needed for moderate pain or headache. (Patient not taking: Reported on 09/28/2020), Disp: , Rfl:  .  cetirizine (ZYRTEC) 10 MG tablet, Take 10 mg by mouth daily as needed for allergies.  (Patient not taking: Reported on 09/28/2020), Disp: , Rfl:   Allergies:  Allergies  Allergen Reactions  . Lisinopril Other (See Comments)    Hyperkalemia   . Hydrochlorothiazide Hives  . Amlodipine Besylate Other (See Comments)    REACTION: ? caused left axillary nodules/chest flutter    Past Medical History, Surgical history, Social history, and Family History were reviewed  and updated.  Review of Systems: Review of Systems  Constitutional: Negative.   HENT:  Negative.   Eyes: Negative.   Respiratory: Negative.   Cardiovascular: Negative.   Gastrointestinal: Negative.   Endocrine: Negative.   Genitourinary: Negative.    Musculoskeletal: Negative.   Skin: Negative.   Neurological: Negative.   Hematological: Negative.   Psychiatric/Behavioral: Negative.     Physical Exam:  weight is 176 lb (79.8 kg). [name] oral temperature is 98 F (36.7 C). [name] blood pressure is 107/67 and [name] pulse is 81. [name] respiration is 20 and oxygen saturation is 97%.   Wt Readings from Last 3 Encounters:  09/28/20 176 lb (79.8 kg)  09/13/20 181 lb 6 oz (82.3 kg)   09/05/20 181 lb (82.1 kg)    Physical Exam Vitals reviewed.  HENT:     Head: Normocephalic and atraumatic.  Eyes:     Pupils: Pupils are equal, round, and reactive to light.  Cardiovascular:     Rate and Rhythm: Normal rate and regular rhythm.     Heart sounds: Normal heart sounds.  Pulmonary:     Effort: Pulmonary effort is normal.     Breath sounds: Normal breath sounds.  Abdominal:     General: Bowel sounds are normal.     Palpations: Abdomen is soft.  Musculoskeletal:        General: No tenderness or deformity. Normal range of motion.     Cervical back: Normal range of motion.  Lymphadenopathy:     Cervical: No cervical adenopathy.  Skin:    General: Skin is warm and dry.     Findings: No erythema or rash.  Neurological:     Mental Status: He is alert and oriented to person, place, and time.  Psychiatric:        Behavior: Behavior normal.        Thought Content: Thought content normal.        Judgment: Judgment normal.    Lab Results  Component Value Date   WBC 7.6 09/28/2020   HGB 13.9 09/28/2020   HCT 45.6 09/28/2020   MCV 67.4 (L) 09/28/2020   PLT 330 09/28/2020     Chemistry      Component Value Date/Time   NA 140 09/28/2020 0924   NA 142 06/07/2020 1111   K 4.3 09/28/2020 0924   CL 105 09/28/2020 0924   CO2 29 09/28/2020 0924   BUN 16 09/28/2020 0924   BUN 10 06/07/2020 1111   CREATININE 0.95 09/28/2020 0924   CREATININE 0.84 11/30/2013 0856      Component Value Date/Time   CALCIUM 9.9 09/28/2020 0924   ALKPHOS 44 09/28/2020 0924   AST 14 (L) 09/28/2020 0924   ALT 8 09/28/2020 0924   BILITOT 0.5 09/28/2020 0924      Impression and Plan: [name] is a very nice 70 year old African-American male.  He has polycythemia vera.  This is by virtue of him having the positive JAK2 assay.  I had to suspect that he is probably iron deficient because of the polycythemia.  He is on full dose aspirin because of [name] cardiac issues.  Again, we  will go ahead phlebotomize him today.  By virtue of this, I think we can probably get him back in 3 months.  If we can get him through the Winter.  I am just happy that things are going well for him.   Volanda Napoleon, MD 2/3/202210:08 AM

## 2020-09-28 NOTE — Patient Instructions (Signed)

## 2020-09-28 NOTE — Progress Notes (Signed)
[name] presents today for phlebotomy per MD orders. Phlebotomy procedure started at 1035 and ended at 1045. 40 cc removed and then the blood stopped flowing. A second attempt started at 1045 and ended at 1055 when noticed infiltration at IV site. Another 293 ml collected. Pt. Was informed that we took a total of 333 cc of blood out. He was satisfied with that volume and refused a third stick. Advised to drink plenty of fluids the day before phlebotomies and eat before coming to the clinic. Patient tolerated procedure well.  Pt discharged in no apparent distress. Pt left ambulatory without assistance. Pt aware of discharge instructions and verbalized understanding and had no further questions. Stable and ASX upon discharge.

## 2020-09-28 NOTE — Telephone Encounter (Signed)
Per los 09/28/20 LVM of upcoming appts

## 2020-09-29 LAB — ERYTHROPOIETIN: Erythropoietin: 6.7 m[IU]/mL (ref 2.6–18.5)

## 2020-10-02 LAB — HGB FRACTIONATION CASCADE
Hgb A2: 2.3 % (ref 1.8–3.2)
Hgb A: 97.7 % (ref 96.4–98.8)
Hgb F: 0 % (ref 0.0–2.0)
Hgb S: 0 %

## 2020-10-08 ENCOUNTER — Other Ambulatory Visit: Payer: Self-pay | Admitting: Internal Medicine

## 2020-10-24 ENCOUNTER — Ambulatory Visit: Payer: Medicare Other | Admitting: Internal Medicine

## 2020-10-31 ENCOUNTER — Ambulatory Visit (INDEPENDENT_AMBULATORY_CARE_PROVIDER_SITE_OTHER): Payer: Medicare Other | Admitting: Internal Medicine

## 2020-10-31 ENCOUNTER — Encounter: Payer: Self-pay | Admitting: Internal Medicine

## 2020-10-31 ENCOUNTER — Other Ambulatory Visit: Payer: Self-pay

## 2020-10-31 VITALS — BP 118/70 | HR 80 | Ht 71.0 in | Wt 181.0 lb

## 2020-10-31 DIAGNOSIS — Z794 Long term (current) use of insulin: Secondary | ICD-10-CM

## 2020-10-31 DIAGNOSIS — E1159 Type 2 diabetes mellitus with other circulatory complications: Secondary | ICD-10-CM | POA: Diagnosis not present

## 2020-10-31 LAB — POCT GLYCOSYLATED HEMOGLOBIN (HGB A1C): Hemoglobin A1C: 6.5 % — AB (ref 4.0–5.6)

## 2020-10-31 LAB — POCT GLUCOSE (DEVICE FOR HOME USE): POC Glucose: 107 mg/dl — AB (ref 70–99)

## 2020-10-31 MED ORDER — DAPAGLIFLOZIN PROPANEDIOL 10 MG PO TABS: 10.0000 mg | ORAL_TABLET | Freq: Every day | ORAL | 3 refills | Status: DC

## 2020-10-31 MED ORDER — METFORMIN HCL 1000 MG PO TABS: 1000.0000 mg | ORAL_TABLET | Freq: Two times a day (BID) | ORAL | 3 refills | Status: DC

## 2020-10-31 NOTE — Progress Notes (Signed)
Name: [name]  Age/ Sex: 70 y.o., male   MRN/ DOB: [id number], [birth year]     PCP: Colon Branch, MD   Reason for Endocrinology Evaluation: Type 2 Diabetes Mellitus  Initial Endocrine Consultative Visit: 04/19/2020    PATIENT IDENTIFIER: [name] is a 70 y.o. male with a past medical history of COPD, non-obstructive CAD, severe AS ( S/P AVR/CABG), CHF, HTN , T2DM and OSA. The patient has followed with Endocrinology clinic since 04/19/2020 for consultative assistance with management of his diabetes.  DIABETIC HISTORY:  [name] was diagnosed with DM in 2009. He has been on metformin and insulin for years.Metfromin dose was reduced by 50% due to hypoglycemia per pt.  His hemoglobin A1c has ranged from 7.2% in 2021, peaking at 8.8%in 2018  On his initial visit to our clinic his A1c was 7.2 % We adjusted MDI regimen and continued metformin   Due to a low BMI of 20.99 we checked GAD-65 and islet cell Ab's which were negative   SUBJECTIVE:   During the last visit (07/25/2020): A1c 7.5 % adjusted MDI regimen and increased  metformin and restarted farxiga      Today (10/31/2020): [name] is here for a follow up on diabetes.He is accompanied by his wife [name].   He checks his blood sugars a 2 times daily. The patient has not  had hypoglycemic episodes since the last clinic visit.  He is S/P cardiac cath with  in 04/2020  Denies nausea or diarrhea    HOME DIABETES REGIMEN:  Levemir 18 units daily  Novolog 6 units with each meal  Metformin 1000 mg , 1 a tablet daily  Farxiga 5 mg daily     Statin: yes ACE-I/ARB: yes   METER DOWNLOAD SUMMARY:  7 day average 135 mg/dL   108- 199 mg/dL   DIABETIC COMPLICATIONS: Microvascular complications:   Denies: CKD, retinopathy , neuropathy   Last eye exam: Completed 08/2019  Macrovascular complications:   CAD  Denies: PVD, CVA   HISTORY:  Past Medical History:  Past Medical History:  Diagnosis  Date  . Abnormal myocardial perfusion study 2006   EF 44% ? inferoseptal ischemia. no cath done  . Benign neoplasm of cecum   . Breast mass, right 03/2008  . Cardiomyopathy ([place]) 07/20/2018   Ejection fraction 4045% in September 2019  . Centrilobular emphysema ([place]) 08/25/2018   02/04/2018-high-res CT- no evidence of interstitial lung disease, severe centrilobular emphysema and mild diffuse bronchial wall thickening suggesting COPD, no calcified pleural plaques no pleural effusion, mild smooth bilateral pleural thickening which is nonspecific  02/04/2018-pulmonary function test- FVC 3.37 (71% predicted), postbronchodilator ratio 50, postbronchodilator FEV1 54, mid flow reve  . COLONIC POLYPS, HX OF 03/18/2007   Qualifier: Diagnosis of  By: [name]   . Contact dermatitis 01/31/2013  . COPD (chronic obstructive pulmonary disease) ([place])    "dx'd 12/2017"  . COPD GOLD II A  08/25/2018   02/04/2018-high-res CT- no evidence of interstitial lung disease, severe centrilobular emphysema and mild diffuse bronchial wall thickening suggesting COPD, no calcified pleural plaques no pleural effusion, mild smooth bilateral pleural thickening which is nonspecific  02/04/2018-pulmonary function test- FVC 3.37 (71% predicted), postbronchodilator ratio 50, postbronchodilator FEV1 54, mid flow reve  . Coronary artery disease   . Critical aortic valve stenosis 01/12/2018   Calculated valve area less than 0.5 cm mean gradient more than 60 mm of mercury  . DEGENERATIVE JOINT DISEASE, BACK 03/18/2007  Qualifier: Diagnosis of  By: [name]   . Depressed left ventricular ejection fraction 03/28/2020  . Dyspnea on exertion 01/12/2018  . ERECTILE DYSFUNCTION, ORGANIC 02/10/2009   Qualifier: Diagnosis of  By: [name]   . Heart murmur   . HEMORRHOIDS, INTERNAL 03/18/2007   Qualifier: Diagnosis of  By: [name]   . Hyperlipemia 10/21/2007   Qualifier: Diagnosis of  By: [name]   .  Hypertension   . Hypoglycemia due to insulin 03/26/2020  . Low back pain   . MYOCARDIAL PERFUSION SCAN, WITH STRESS TEST, ABNORMAL 08/11/2008   Qualifier: Diagnosis of  By: Johnsie Cancel, MD, Rona Ravens   . Nocturnal hypoxemia 08/25/2018   04/09/18 >>> ONO - telephone note>>> Patient's ONO on room air showed that he was less than 88% for more than an hour. Patient needs to be on 2 L at HS.  10/06/2018-overnight oximetry-entire duration was 4 hours and 23 minutes, SPO2 less than 88% for 25 minutes and 32 seconds    . Oxygen deficiency    uses 2L at night  . Paroxysmal atrial fibrillation ([place]) 10/12/2018  . S/P AVR 03/04/2018   AVR 23 mm Edwards Magna Valve - bioprostetic 03/04/2018 LIMA - LAD  . Status post coronary artery bypass graft 03/20/2018   LIMA to LAD during aortic valve replacement surgery 03/04/2018  . Type 2 diabetes mellitus, uncontrolled, with neuropathy ([place]) 05/20/2008   Qualifier: Diagnosis of  By: [name]    Past Surgical History:  Past Surgical History:  Procedure Laterality Date  . ABDOMINAL AORTOGRAM N/A 01/13/2018   Procedure: ABDOMINAL AORTOGRAM;  Surgeon: Troy Sine, MD;  Location: [facility];  Service: Cardiovascular;  Laterality: N/A;  . AORTIC VALVE REPLACEMENT N/A 03/04/2018   Procedure: AORTIC VALVE REPLACEMENT (AVR) 66mm Edwards Magna Ease Tissue Valve.;  Surgeon: Melrose Nakayama, MD;  Location: [facility];  Service: Open Heart Surgery;  Laterality: N/A;  . CARDIAC VALVE REPLACEMENT    . COLONOSCOPY W/ POLYPECTOMY    . COLONOSCOPY WITH PROPOFOL N/A 02/22/2020   Procedure: COLONOSCOPY WITH PROPOFOL;  Surgeon: Doran Stabler, MD;  Location: [facility];  Service: Gastroenterology;  Laterality: N/A;  . CORONARY ARTERY BYPASS GRAFT N/A 03/04/2018   Procedure: CORONARY ARTERY BYPASS GRAFTING (CABG) x1:  LIMA to LAD.;  Surgeon: Melrose Nakayama, MD;  Location: [facility];  Service: Open Heart Surgery;  Laterality: N/A;  . HEMOSTASIS CLIP  PLACEMENT  02/22/2020   Procedure: HEMOSTASIS CLIP PLACEMENT;  Surgeon: Doran Stabler, MD;  Location: [facility];  Service: Gastroenterology;;  . Cruzita Lederer PRESSURE WIRE/FFR STUDY N/A 05/10/2020   Procedure: INTRAVASCULAR PRESSURE WIRE/FFR STUDY;  Surgeon: Sherren Mocha, MD;  Location: [facility];  Service: Cardiovascular;  Laterality: N/A;  . LEFT HEART CATH AND CORS/GRAFTS ANGIOGRAPHY N/A 05/10/2020   Procedure: LEFT HEART CATH AND CORS/GRAFTS ANGIOGRAPHY;  Surgeon: Sherren Mocha, MD;  Location: [facility];  Service: Cardiovascular;  Laterality: N/A;  . LIPOMA EXCISION Left 07/2008    "back of shoulder" Dr Gershon Crane  . POLYPECTOMY  02/22/2020   Procedure: POLYPECTOMY;  Surgeon: Doran Stabler, MD;  Location: [facility];  Service: Gastroenterology;;  . RIGHT/LEFT HEART CATH AND CORONARY ANGIOGRAPHY N/A 01/13/2018   Procedure: RIGHT/LEFT HEART CATH AND CORONARY ANGIOGRAPHY;  Surgeon: Troy Sine, MD;  Location: [facility];  Service: Cardiovascular;  Laterality: N/A;  . TEE WITHOUT CARDIOVERSION  N/A 03/04/2018   Procedure: TRANSESOPHAGEAL ECHOCARDIOGRAM (TEE);  Surgeon: Melrose Nakayama, MD;  Location: [facility];  Service: Open Heart Surgery;  Laterality: N/A;  . TONSILLECTOMY    . WISDOM TOOTH EXTRACTION      Social History:  reports that he quit smoking about 10 years ago. His smoking use included cigarettes. He has a 61.50 pack-year smoking history. He has never used smokeless tobacco. He reports current alcohol use. He reports previous drug use. Family History:  Family History  Problem Relation Age of Onset  . Melanoma Father 74       deceased secondary to melanoma  . Alzheimer's disease Father   . Diabetes Father   . Hypertension Mother        alive -86  . Colon cancer Neg Hx   . Esophageal cancer Neg Hx   . Colon polyps Neg Hx   . Stomach cancer Neg Hx      HOME MEDICATIONS: Allergies as of 10/31/2020      Reactions   Lisinopril Other  (See Comments)   Hyperkalemia   Hydrochlorothiazide Hives   Amlodipine Besylate Other (See Comments)   REACTION: ? caused left axillary nodules/chest flutter      Medication List       Accurate as of October 31, 2020  9:14 AM. If you have any questions, ask your nurse or doctor.        acetaminophen 500 MG tablet Commonly known as: TYLENOL Take 1,000 mg by mouth every 6 (six) hours as needed for moderate pain or headache.   aspirin 325 MG EC tablet Take 1 tablet (325 mg total) by mouth daily.   cetirizine 10 MG tablet Commonly known as: ZYRTEC Take 10 mg by mouth daily as needed for allergies.   dapagliflozin propanediol 10 MG Tabs tablet Commonly known as: Farxiga Take 1 tablet (10 mg total) by mouth daily. What changed:   medication strength  how much to take  when to take this Changed by: Dorita Sciara, MD   Entresto 97-103 MG Generic drug: sacubitril-valsartan Take 1 tablet by mouth 2 (two) times daily.   glucose blood test strip Commonly known as: Prodigy No Coding Blood Gluc Use as instructed to check blood sugar twice a day.  DX  E11.40   hydrALAZINE 10 MG tablet Commonly known as: APRESOLINE Take 1 tablet (10 mg total) by mouth 3 (three) times daily.   Insulin Pen Needle 32G X 4 MM Misc 1 Device by Does not apply route as directed.   Levemir FlexTouch 100 UNIT/ML FlexPen Generic drug: insulin detemir Inject 18 Units into the skin every morning.   metFORMIN 1000 MG tablet Commonly known as: GLUCOPHAGE Take 1 tablet (1,000 mg total) by mouth 2 (two) times daily with a meal. What changed: when to take this Changed by: Dorita Sciara, MD   metoprolol succinate 50 MG 24 hr tablet Commonly known as: TOPROL-XL Take 50 mg by mouth daily.   NovoLOG FlexPen 100 UNIT/ML FlexPen Generic drug: insulin aspart Inject 6 Units into the skin with breakfast, with lunch, and with evening meal.   rosuvastatin 20 MG tablet Commonly known as:  CRESTOR Take 1 tablet (20 mg total) by mouth daily.   VITAMIN B-12 PO Take 1 tablet by mouth daily.        OBJECTIVE:   Vital Signs: BP 118/70   Pulse 80   Ht 5\' 11"  (1.803 m)   Wt 181 lb (82.1 kg)   SpO2 94%  BMI 25.24 kg/m   Wt Readings from Last 3 Encounters:  10/31/20 181 lb (82.1 kg)  09/28/20 176 lb (79.8 kg)  09/13/20 181 lb 6 oz (82.3 kg)     Exam: General: Pt appears well and is in NAD  Lungs: Clear with good BS bilat  Heart: RRR   Extremities: No pretibial edema.   Neuro: MS is good with appropriate affect, pt is alert and Ox3          DATA REVIEWED:  Results for PAVLE, WILER (MRN [id number]) as of 10/31/2020 09:14  Ref. Range 09/28/2020 09:24  Sodium Latest Ref Range: 135 - 145 mmol/L 140  Potassium Latest Ref Range: 3.5 - 5.1 mmol/L 4.3  Chloride Latest Ref Range: 98 - 111 mmol/L 105  CO2 Latest Ref Range: 22 - 32 mmol/L 29  Glucose Latest Ref Range: 70 - 99 mg/dL 73  BUN Latest Ref Range: 8 - 23 mg/dL 16  Creatinine Latest Ref Range: 0.61 - 1.24 mg/dL 0.95  Calcium Latest Ref Range: 8.9 - 10.3 mg/dL 9.9  Anion gap Latest Ref Range: 5 - 15  6  Alkaline Phosphatase Latest Ref Range: 38 - 126 U/L 44  Albumin Latest Ref Range: 3.5 - 5.0 g/dL 4.3  AST Latest Ref Range: 15 - 41 U/L 14 (L)  ALT Latest Ref Range: 0 - 44 U/L 8  Total Protein Latest Ref Range: 6.5 - 8.1 g/dL 7.4  Total Bilirubin Latest Ref Range: 0.3 - 1.2 mg/dL 0.5  GFR, Est Non African American Latest Ref Range: >60 mL/min >60   Lab Results  Component Value Date   HGBA1C 6.5 (A) 10/31/2020   HGBA1C 7.5 (A) 07/25/2020   HGBA1C 7.2 (H) 03/23/2020   Lab Results  Component Value Date   MICROALBUR 0.9 04/19/2020   LDLCALC 56 09/13/2020   CREATININE 0.95 09/28/2020   Lab Results  Component Value Date   MICRALBCREAT 7 04/19/2020     Lab Results  Component Value Date   CHOL 115 09/13/2020   HDL 44.30 09/13/2020   LDLCALC 56 09/13/2020   LDLDIRECT 177.6 10/07/2007    TRIG 69.0 09/13/2020   CHOLHDL 3 09/13/2020       ISLET CELL ANTIBODY SCREEN NEGATIVE NEGATIVE    Glutamic Acid [place] Ab <5 IU/mL <5      ASSESSMENT / PLAN / RECOMMENDATIONS:   1) Type 2 Diabetes Mellitus, Optimally controlled, With macrovascular  complications - Most recent A1c of 6.5 %. Goal A1c < 7.0 %.    - Praised the pt on improved glycemic control withOUT hypoglycemia - We discussed increased [place] and Metformin as below, will stop standing dose of prandial insulin but will provide him with a correction scale to be used if needed for hyperglycemia   MEDICATIONS: - Increase  Farxiga 10 mg , 1 tablet with Breakfast  - Increase Metformin 1000 mg, 1 tablet BID - Continue  Levemir  18 units daily  - Stop Novolog 6 units with each meal  - Correction scale : Novolog (BG-140/40)    EDUCATION / INSTRUCTIONS:  BG monitoring instructions: Patient is instructed to check his blood sugars 3 times a day, before meals  . Call [facility] if: BG persistently < 100  . I reviewed the Rule of 15 for the treatment of hypoglycemia in detail with the patient. Literature supplied.     2) Diabetic complications:   Eye: Does not have known diabetic retinopathy.   Neuro/ Feet: Does not have known diabetic  peripheral neuropathy .   Renal: Patient does not have known baseline CKD. He   is  on an ACEI/ARB at present.   F/U in 3 months    Signed electronically by: Mack Guise, MD  [facility]  [facility] [address] [place], [place] [address] Phone: [phone] FAX: [phone]   CC: Colon Branch, [address] [place] [address] Phone: [phone]  Fax: [phone]  Return to Endocrinology clinic as below: Future Appointments  Date Time Provider [place]  11/13/2020 10:20 AM Berniece Salines, DO [facility] None  12/04/2020 11:00 AM [facility] ECHO OP 1 [facility]  12/04/2020 12:00 PM  Bensimhon, Shaune Pascal, MD [facility] None  12/26/2020  9:45 AM [facility] LAB [facility] None  12/26/2020 10:15 AM Marin Olp, Rudell Cobb, MD [facility] None  12/26/2020 10:45 AM [facility] A1 [facility] None  02/13/2021  9:30 AM Hanan Mcwilliams, Melanie Crazier, MD [facility]  05/23/2021 10:00 AM Colon Branch, MD [facility]

## 2020-10-31 NOTE — Patient Instructions (Addendum)
-   Keep up the Good Work ! A1c 6.5 %  - Increase  Farxiga 10 mg , 1 tablet with Breakfast  - Increase Metformin 1000 mg, 1 tablet TWICE a day  - Continue  Levemir 18 units daily  - DO NOT take NOVOLOG 6 units with each meal anymore, use the following scale   - Novlog correctional insulin:Use the scale below to help guide you:   Blood sugar before meal Number of units to inject  Less than 185 0 unit  186 - 225 1 units  226 -  265 2 units  265 -  305 3 units  306 -  345 4 units        HOW TO TREAT LOW BLOOD SUGARS (Blood sugar LESS THAN 70 MG/DL)  Please follow the RULE OF 15 for the treatment of hypoglycemia treatment (when your (blood sugars are less than 70 mg/dL)    STEP 1: Take 15 grams of carbohydrates when your blood sugar is low, which includes:   3-4 GLUCOSE TABS  OR  3-4 OZ OF JUICE OR REGULAR SODA OR  ONE TUBE OF GLUCOSE GEL     STEP 2: RECHECK blood sugar in 15 MINUTES STEP 3: If your blood sugar is still low at the 15 minute recheck --> then, go back to STEP 1 and treat AGAIN with another 15 grams of carbohydrates.

## 2020-11-10 ENCOUNTER — Other Ambulatory Visit: Payer: Self-pay

## 2020-11-13 ENCOUNTER — Ambulatory Visit (INDEPENDENT_AMBULATORY_CARE_PROVIDER_SITE_OTHER): Payer: Medicare Other | Admitting: Cardiology

## 2020-11-13 ENCOUNTER — Other Ambulatory Visit: Payer: Self-pay

## 2020-11-13 ENCOUNTER — Encounter: Payer: Self-pay | Admitting: Cardiology

## 2020-11-13 VITALS — BP 116/82 | HR 90 | Ht 76.0 in | Wt 175.1 lb

## 2020-11-13 DIAGNOSIS — I10 Essential (primary) hypertension: Secondary | ICD-10-CM

## 2020-11-13 DIAGNOSIS — I2510 Atherosclerotic heart disease of native coronary artery without angina pectoris: Secondary | ICD-10-CM | POA: Diagnosis not present

## 2020-11-13 DIAGNOSIS — I428 Other cardiomyopathies: Secondary | ICD-10-CM | POA: Diagnosis not present

## 2020-11-13 DIAGNOSIS — E782 Mixed hyperlipidemia: Secondary | ICD-10-CM

## 2020-11-13 DIAGNOSIS — I5020 Unspecified systolic (congestive) heart failure: Secondary | ICD-10-CM

## 2020-11-13 DIAGNOSIS — Z952 Presence of prosthetic heart valve: Secondary | ICD-10-CM | POA: Diagnosis not present

## 2020-11-13 NOTE — Patient Instructions (Signed)

## 2020-11-13 NOTE — Progress Notes (Signed)
Cardiology Office Note:    Date:  11/13/2020   ID:  [name], DOB 1951/07/16, MRN [id number]  PCP:  Colon Branch, MD  Cardiologist:  Berniece Salines, DO  Electrophysiologist:  None   Referring MD: Colon Branch, MD  I am doing better History of Present Illness:    [name] is a 70 y.o. male with a  hx of coronary artery disease status post CABG in 2019, ischemic cardiomyopathy most recent EF on March 27, 2020 at which time his EF was reported to be 25 to 30%,Status post aortic valve replacement with a 23 Edwards valve also in July 2019, hypertension hyperlipidemia and diabetes.  Polycythemia vera and is undergoing phlebotomy.  I saw the patient on April 05, 2020 at that time we kept him on his losartan as well as his beta-blockerandaddedAldactone.   When I saw the patient 1 May 05, 2020 he still with experiencing significant fatigue and shortness of breath. Given his history ofCAD I recommended he undergo a left heart catheterization to make sure that worsening/progression of CAD was not causing symptoms.  He did undergo left heart catheterization there is no need for a stent placement.  I saw the patient on August 14, 2020 at that time we will continue his beta-blocker, increase his Entresto to 49-51 twice daily, continue Aldactone 12.5, Hydralazine 10 mg 3 times daily and Farxiga 10 mg daily.  We discussed his cardiac MRI which show EF of 25%.  Since his visit he has seen Dr. Haroldine Laws in heart failure clinic.  His Delene Loll has been increased to 97-103 mg twice a day.  Today he tells me he has been feeling a lot better.  He offers no complaints at this time.  Past Medical History:  Diagnosis Date  . Abnormal myocardial perfusion study 2006   EF 44% ? inferoseptal ischemia. no cath done  . Benign neoplasm of cecum   . Breast mass, right 03/2008  . Cardiomyopathy ([place]) 07/20/2018   Ejection fraction 4045% in September 2019  . Centrilobular emphysema ([place])  08/25/2018   02/04/2018-high-res CT- no evidence of interstitial lung disease, severe centrilobular emphysema and mild diffuse bronchial wall thickening suggesting COPD, no calcified pleural plaques no pleural effusion, mild smooth bilateral pleural thickening which is nonspecific  02/04/2018-pulmonary function test- FVC 3.37 (71% predicted), postbronchodilator ratio 50, postbronchodilator FEV1 54, mid flow reve  . COLONIC POLYPS, HX OF 03/18/2007   Qualifier: Diagnosis of  By: [name]   . Contact dermatitis 01/31/2013  . COPD (chronic obstructive pulmonary disease) ([place])    "dx'd 12/2017"  . COPD GOLD II A  08/25/2018   02/04/2018-high-res CT- no evidence of interstitial lung disease, severe centrilobular emphysema and mild diffuse bronchial wall thickening suggesting COPD, no calcified pleural plaques no pleural effusion, mild smooth bilateral pleural thickening which is nonspecific  02/04/2018-pulmonary function test- FVC 3.37 (71% predicted), postbronchodilator ratio 50, postbronchodilator FEV1 54, mid flow reve  . Coronary artery disease   . Critical aortic valve stenosis 01/12/2018   Calculated valve area less than 0.5 cm mean gradient more than 60 mm of mercury  . DEGENERATIVE JOINT DISEASE, BACK 03/18/2007   Qualifier: Diagnosis of  By: [name]   . Depressed left ventricular ejection fraction 03/28/2020  . Diabetes mellitus ([place]) 07/25/2020  . Dyslipidemia 04/20/2020  . Dyspnea on exertion 01/12/2018  . ERECTILE DYSFUNCTION, ORGANIC 02/10/2009   Qualifier: Diagnosis of  By: [name]   .  Former cigarette smoker 10/12/2018   Quit in 2012 41-pack-year smoking history  . Goals of care, counseling/discussion 08/24/2020  . H/O aortic valve replacement 05/05/2020  . Heart murmur   . HEMORRHOIDS, INTERNAL 03/18/2007   Qualifier: Diagnosis of  By: [name]   . HTN (hypertension) 10/21/2007       . Hyperlipemia 10/21/2007   Qualifier: Diagnosis of  By: [name]    . Hypertension   . Hypoglycemia due to insulin 03/26/2020  . Low back pain   . MYOCARDIAL PERFUSION SCAN, WITH STRESS TEST, ABNORMAL 08/11/2008   Qualifier: Diagnosis of  By: Johnsie Cancel, MD, Rona Ravens   . Nocturnal hypoxemia 08/25/2018   04/09/18 >>> ONO - telephone note>>> Patient's ONO on room air showed that he was less than 88% for more than an hour. Patient needs to be on 2 L at HS.  10/06/2018-overnight oximetry-entire duration was 4 hours and 23 minutes, SPO2 less than 88% for 25 minutes and 32 seconds    . Oxygen deficiency    uses 2L at night  . Paroxysmal atrial fibrillation ([place]) 10/12/2018  . PCP NOTES >>>>>>>>>>>>>>>>> 05/14/2020  . Polycythemia vera ([place]) 08/04/2020  . S/P AVR 03/04/2018   AVR 23 mm Edwards Magna Valve - bioprostetic 03/04/2018 LIMA - LAD  . Status post coronary artery bypass graft 03/20/2018   LIMA to LAD during aortic valve replacement surgery 03/04/2018  . Type 2 diabetes mellitus with hyperglycemia, with long-term current use of insulin ([place]) 04/20/2020  . Type 2 diabetes mellitus, uncontrolled, with neuropathy ([place]) 05/20/2008   Qualifier: Diagnosis of  By: [name]     Past Surgical History:  Procedure Laterality Date  . ABDOMINAL AORTOGRAM N/A 01/13/2018   Procedure: ABDOMINAL AORTOGRAM;  Surgeon: Troy Sine, MD;  Location: [facility];  Service: Cardiovascular;  Laterality: N/A;  . AORTIC VALVE REPLACEMENT N/A 03/04/2018   Procedure: AORTIC VALVE REPLACEMENT (AVR) 47mm Edwards Magna Ease Tissue Valve.;  Surgeon: Melrose Nakayama, MD;  Location: [facility];  Service: Open Heart Surgery;  Laterality: N/A;  . CARDIAC VALVE REPLACEMENT    . COLONOSCOPY W/ POLYPECTOMY    . COLONOSCOPY WITH PROPOFOL N/A 02/22/2020   Procedure: COLONOSCOPY WITH PROPOFOL;  Surgeon: Doran Stabler, MD;  Location: [facility];  Service: Gastroenterology;  Laterality: N/A;  . CORONARY ARTERY BYPASS GRAFT N/A 03/04/2018   Procedure: CORONARY ARTERY BYPASS  GRAFTING (CABG) x1:  LIMA to LAD.;  Surgeon: Melrose Nakayama, MD;  Location: [facility];  Service: Open Heart Surgery;  Laterality: N/A;  . HEMOSTASIS CLIP PLACEMENT  02/22/2020   Procedure: HEMOSTASIS CLIP PLACEMENT;  Surgeon: Doran Stabler, MD;  Location: [facility];  Service: Gastroenterology;;  . Cruzita Lederer PRESSURE WIRE/FFR STUDY N/A 05/10/2020   Procedure: INTRAVASCULAR PRESSURE WIRE/FFR STUDY;  Surgeon: Sherren Mocha, MD;  Location: [facility];  Service: Cardiovascular;  Laterality: N/A;  . LEFT HEART CATH AND CORS/GRAFTS ANGIOGRAPHY N/A 05/10/2020   Procedure: LEFT HEART CATH AND CORS/GRAFTS ANGIOGRAPHY;  Surgeon: Sherren Mocha, MD;  Location: [facility];  Service: Cardiovascular;  Laterality: N/A;  . LIPOMA EXCISION Left 07/2008    "back of shoulder" Dr Gershon Crane  . POLYPECTOMY  02/22/2020   Procedure: POLYPECTOMY;  Surgeon: Doran Stabler, MD;  Location: [facility];  Service: Gastroenterology;;  . RIGHT/LEFT HEART CATH AND CORONARY ANGIOGRAPHY N/A 01/13/2018   Procedure: RIGHT/LEFT HEART CATH AND CORONARY ANGIOGRAPHY;  Surgeon: Shelva Majestic  A, MD;  Location: [facility];  Service: Cardiovascular;  Laterality: N/A;  . TEE WITHOUT CARDIOVERSION N/A 03/04/2018   Procedure: TRANSESOPHAGEAL ECHOCARDIOGRAM (TEE);  Surgeon: Melrose Nakayama, MD;  Location: [facility];  Service: Open Heart Surgery;  Laterality: N/A;  . TONSILLECTOMY    . WISDOM TOOTH EXTRACTION      Current Medications: Current Meds  Medication Sig  . acetaminophen (TYLENOL) 500 MG tablet Take 1,000 mg by mouth every 6 (six) hours as needed for moderate pain or headache.  Marland Kitchen aspirin EC 325 MG EC tablet Take 1 tablet (325 mg total) by mouth daily.  . cetirizine (ZYRTEC) 10 MG tablet Take 10 mg by mouth daily as needed for allergies.  . Cyanocobalamin (VITAMIN B-12 PO) Take 1 tablet by mouth daily.  . dapagliflozin propanediol (FARXIGA) 10 MG TABS tablet Take 1 tablet (10 mg total) by mouth  daily.  Marland Kitchen glucose blood (PRODIGY NO CODING BLOOD GLUC) test strip Use as instructed to check blood sugar twice a day.  DX  E11.40  . hydrALAZINE (APRESOLINE) 10 MG tablet Take 1 tablet (10 mg total) by mouth 3 (three) times daily.  . insulin aspart (NOVOLOG FLEXPEN) 100 UNIT/ML FlexPen Inject 6 Units into the skin with breakfast, with lunch, and with evening meal.  . insulin detemir (LEVEMIR FLEXTOUCH) 100 UNIT/ML FlexPen Inject 18 Units into the skin every morning.  . Insulin Pen Needle 32G X 4 MM MISC 1 Device by Does not apply route as directed.  . isosorbide mononitrate (IMDUR) 30 MG 24 hr tablet Take 15 mg by mouth daily.  . metFORMIN (GLUCOPHAGE) 1000 MG tablet Take 1 tablet (1,000 mg total) by mouth 2 (two) times daily with a meal.  . metoprolol succinate (TOPROL-XL) 50 MG 24 hr tablet Take 50 mg by mouth daily.  . rosuvastatin (CRESTOR) 20 MG tablet Take 1 tablet (20 mg total) by mouth daily.  . sacubitril-valsartan (ENTRESTO) 97-103 MG Take 1 tablet by mouth 2 (two) times daily.     Allergies:   Lisinopril, Hydrochlorothiazide, and Amlodipine besylate   Social History   Socioeconomic History  . Marital status: Married    Spouse name: Not on file  . Number of children: Not on file  . Years of education: Not on file  . Highest education level: Not on file  Occupational History  . Not on file  Tobacco Use  . Smoking status: Former Smoker    Packs/day: 1.50    Years: 41.00    Pack years: 61.50    Types: Cigarettes    Quit date: 08/26/2010    Years since quitting: 10.2  . Smokeless tobacco: Never Used  . Tobacco comment: Quit 2012  Vaping Use  . Vaping Use: Never used  Substance and Sexual Activity  . Alcohol use: Yes    Comment: occasionally  . Drug use: Not Currently  . Sexual activity: Not Currently  Other Topics Concern  . 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 Difficulty of Paying Living Expenses: Not hard at all  Food Insecurity: No Food Insecurity  . Worried About Charity fundraiser in the Last Year: Never true  .  Ran Out of Food in the Last Year: Never true  Transportation Needs: No Transportation Needs  . Lack of Transportation (Medical): No  . Lack of Transportation (Non-Medical): No  Physical Activity: Not on file  Stress: Not on file  Social Connections: Not on file     Family History: The patient's family history includes Alzheimer's disease in his father; Diabetes in his father; Hypertension in his mother; Melanoma (age of onset: 61) in his father. There is no history of Colon cancer, Esophageal cancer, Colon polyps, or Stomach cancer.  ROS:   Review of Systems  Constitution: Negative for decreased appetite, fever and weight gain.  HENT: Negative for congestion, ear discharge, hoarse voice and sore throat.   Eyes: Negative for discharge, redness, vision loss in right eye and visual halos.  Cardiovascular: Negative for chest pain, dyspnea on exertion, leg swelling, orthopnea and palpitations.  Respiratory: Negative for cough, hemoptysis, shortness of breath and snoring.   Endocrine: Negative for heat intolerance and polyphagia.  Hematologic/Lymphatic: Negative for bleeding problem. Does not bruise/bleed easily.  Skin: Negative for flushing, nail changes, rash and suspicious lesions.  Musculoskeletal: Negative for arthritis, joint pain, muscle cramps, myalgias, neck pain and stiffness.  Gastrointestinal: Negative for abdominal pain, bowel incontinence, diarrhea and excessive appetite.  Genitourinary: Negative for decreased libido, genital sores and incomplete emptying.  Neurological: Negative for brief paralysis, focal weakness, headaches and loss of balance.  Psychiatric/Behavioral: Negative for altered mental status,  depression and suicidal ideas.  Allergic/Immunologic: Negative for HIV exposure and persistent infections.    EKGs/Labs/Other Studies Reviewed:    The following studies were reviewed today:   EKG: None today  Cardiac MRI FINDINGS: Limited images of the lung fields showed no gross abnormalities. The patient is status post sternotomy.  Mildly dilated left ventricle with mild LV hypertrophy. Septal dyskinesis, EF calculated at 25% (lower than it looks visually, likely due to dyskinesis). Normal right ventricular size with mildly decreased systolic function, EF 29%. Normal left and right atrial sizes. Bioprosthetic aortic valve with minimal regurgitation noted. No significant mitral regurgitation noted.  Difficult/poor quality delayed enhancement images. Possible small area of subepicardial late gadolinium enhancement (LGE) in the mid inferoseptal wall at the RV insertion site.  Measurements:  LVEDV 176 mL LVSV 45 mL LVEF 25%  RVEDV 132 mL RVSV 48 mL RVEF 37%  IMPRESSION: 1. Mildly dilated LV with mild LV hypertrophy. Dyskinetic septal wall with EF 25%.  2. Normal RV size with mildly decreased systolic function, EF 51%.  3. Poor delayed enhancement images. No coronary disease-type LGE noted. Possible inferoseptal wall RV insertion site LGE, nonspecific and suggestive of volume/pressure overload.  Recent Labs: 03/26/2020: TSH 0.574 06/07/2020: Magnesium 1.9 09/05/2020: B Natriuretic Peptide 76.4 09/28/2020: ALT 8; BUN 16; Creatinine 0.95; Hemoglobin 13.9; Platelet Count 330; Potassium 4.3; Sodium 140  Recent Lipid Panel    Component Value Date/Time   CHOL 115 09/13/2020 1041   TRIG 69.0 09/13/2020 1041   HDL 44.30 09/13/2020 1041   CHOLHDL 3 09/13/2020 1041   VLDL 13.8 09/13/2020 1041   LDLCALC 56 09/13/2020 1041   LDLDIRECT 177.6 10/07/2007 1001    Physical Exam:    VS:  BP 116/82   Pulse 90   Ht 6\' 4"  (1.93 m)   Wt 175 lb 1.6 oz (79.4 kg)   SpO2  92%   BMI 21.31 kg/m     Wt Readings from Last 3 Encounters:  11/13/20 175 lb 1.6 oz (79.4 kg)  10/31/20 181 lb (82.1  kg)  09/28/20 176 lb (79.8 kg)     GEN: Well nourished, well developed in no acute distress HEENT: Normal NECK: No JVD; No carotid bruits LYMPHATICS: No lymphadenopathy CARDIAC: S1S2 noted,RRR, no murmurs, rubs, gallops RESPIRATORY:  Clear to auscultation without rales, wheezing or rhonchi  ABDOMEN: Soft, non-tender, non-distended, +bowel sounds, no guarding. EXTREMITIES: No edema, No cyanosis, no clubbing MUSCULOSKELETAL:  No deformity  SKIN: Warm and dry NEUROLOGIC:  Alert and oriented x 3, non-focal PSYCHIATRIC:  Normal affect, good insight  ASSESSMENT:    1. Primary hypertension   2. Coronary artery disease involving native coronary artery of native heart without angina pectoris   3. Nonischemic cardiomyopathy (HCC)   4. HFrEF (heart failure with reduced ejection fraction) ([place])   5. Mixed hyperlipidemia   6. Status post aortic valve replacement    PLAN:    Suspected that his chronic systolic heart failure is primarily nonischemic in nature.  He had recent heart catheterization which showed that his LIMA graft is atretic as well as 65% lesion in his LAD.  His most recent EF in August 2021 was 25 to 30%.  His cardiac MRI showed a LVEF of 25% in December 2021 with mild RV dysfunction.  He is on guideline directed therapy and is doing well his Delene Loll was going to be continued at 97/103 twice daily, Toprol-XL 50 mg daily, Aldactone 25 mg daily and hydralazine 10 mg 3 times daily with [place].  He has an echocardiogram scheduled for December 04, 2020 for now evaluation for ICD has been placed on hold and we will reassess after his upcoming echocardiogram.  No anginal symptoms at this time.  Continue aspirin and statin.  This is being managed by his primary care doctor.  No adjustments for antidiabetic medications were made today.   Blood pressure is acceptable,  continue with current antihypertensive regimen.  Hyperlipidemia - continue with current statin medication.  The patient is in agreement with the above plan. The patient left the office in stable condition.  The patient will follow up 6 months or sooner  if needed.   Medication Adjustments/Labs and Tests Ordered: Current medicines are reviewed at length with the patient today.  Concerns regarding medicines are outlined above.  No orders of the defined types were placed in this encounter.  No orders of the defined types were placed in this encounter.   Patient Instructions  Medication Instructions:  No medication changes. *If you need a refill on your cardiac medications before your next appointment, please call your pharmacy*   Lab Work: None ordered If you have labs (blood work) drawn today and your tests are completely normal, you will receive your results only by: Marland Kitchen MyChart Message (if you have MyChart) OR . A paper copy in the mail If you have any lab test that is abnormal or we need to change your treatment, we will call you to review the results.   Testing/Procedures: None ordered   Follow-Up: At [facility], you and your health needs are our priority.  As part of our continuing mission to provide you with exceptional heart care, we have created designated Provider Care Teams.  These Care Teams include your primary Cardiologist (physician) and Advanced Practice Providers (APPs -  Physician Assistants and Nurse Practitioners) who all work together to provide you with the care you need, when you need it.  We recommend signing up for the patient portal called "MyChart".  Sign up information is provided on this After Visit Summary.  MyChart is used to connect with patients for Virtual Visits (Telemedicine).  Patients are able to view lab/test results, encounter notes, upcoming appointments, etc.  Non-urgent messages can be sent to your provider as well.   To learn more about  what you can do with MyChart, go to [email or web address].    Your next appointment:   6 month(s)  The format for your next appointment:   In Person  Provider:   Berniece Salines, DO   Other Instructions NA     Adopting a Healthy Lifestyle.  Know what a healthy weight is for you (roughly BMI <25) and aim to maintain this   Aim for 7+ servings of fruits and vegetables daily   65-80+ fluid ounces of water or unsweet tea for healthy kidneys   Limit to max 1 drink of alcohol per day; avoid smoking/tobacco   Limit animal fats in diet for cholesterol and heart health - choose grass fed whenever available   Avoid highly processed foods, and foods high in saturated/trans fats   Aim for low stress - take time to unwind and care for your mental health   Aim for 150 min of moderate intensity exercise weekly for heart health, and weights twice weekly for bone health   Aim for 7-9 hours of sleep daily   When it comes to diets, agreement about the perfect plan isnt easy to find, even among the experts. Experts at the [employer] developed an idea known as the Healthy Eating Plate. Just imagine a plate divided into logical, healthy portions.   The emphasis is on diet quality:   Load up on vegetables and fruits - one-half of your plate: Aim for color and variety, and remember that potatoes dont count.   Go for whole grains - one-quarter of your plate: Whole wheat, barley, wheat berries, quinoa, oats, brown rice, and foods made with them. If you want pasta, go with whole wheat pasta.   Protein power - one-quarter of your plate: Fish, chicken, beans, and nuts are all healthy, versatile protein sources. Limit red meat.   The diet, however, does go beyond the plate, offering a few other suggestions.   Use healthy plant oils, such as olive, canola, soy, corn, sunflower and peanut. Check the labels, and avoid partially hydrogenated oil, which have unhealthy trans  fats.   If youre thirsty, drink water. Coffee and tea are good in moderation, but skip sugary drinks and limit milk and dairy products to one or two daily servings.   The type of carbohydrate in the diet is more important than the amount. Some sources of carbohydrates, such as vegetables, fruits, whole grains, and beans-are healthier than others.   Finally, stay active  Signed, Berniece Salines, DO  11/13/2020 10:20 AM    [place]

## 2020-11-29 ENCOUNTER — Other Ambulatory Visit: Payer: Self-pay | Admitting: Internal Medicine

## 2020-12-02 NOTE — Progress Notes (Signed)
ADVANCED HF CLINIC NOTE  Referring Physician: Dr. Harriet Masson Primary Care: Colon Branch, MD  Primary Cardiologist: Dr. Harriet Masson  HPI:  [name] a 69 y.o.maleformer smoker (75 py, quit 2012) with COPD, non-obstructive CAD, severe AS s/p bioprosthetic AVR/CABG (LIMA to D1 -> LAD)  in 0093, systolic HF due to NICM (EF 25-30%), OSA, HTN, p.vera with Jak2 mutation and DM2 referred by Dr. Harriet Masson for further evaluation of his HF.  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   Echo at time of AVR in 2019 EF 35-40%  Underwent LHC in 9/21 for worsening fatigue and DOE.  LM ok LAD 65% LCX 30% Ramus 65% RCA 20%  Patent but atretic LIMA graft LVEDP 7  cMRI 12/21 1. EF 25% 2.  Normal RV size with mildly decreased systolic function, EF 81%. 3. Minimal LGE  At last visit Entresto increased. Here for f/u.  Here with his wife. Says he feels good. Digs in his garden. No CP or SOB. No edema, orthopnea or PND. Compliant with her medications.    Echo today 12/04/20: EF 40-45% AoV functioning normally Mean gradient 32mmHG. Personally reviewed.   Studies:   PFTs 2019 FEV 1 1.77 (49%) FVC 3.37 (71%) Ratio 53% DLCO 36%  Review of Systems: All systems negative except as listed in HPI above   Past Medical History:  Diagnosis Date  . Abnormal myocardial perfusion study 2006   EF 44% ? inferoseptal ischemia. no cath done  . Benign neoplasm of cecum   . Breast mass, right 03/2008  . Cardiomyopathy ([place]) 07/20/2018   Ejection fraction 4045% in September 2019  . Centrilobular emphysema ([place]) 08/25/2018   02/04/2018-high-res CT- no evidence of interstitial lung disease, severe centrilobular emphysema and mild diffuse bronchial wall thickening suggesting COPD, no calcified pleural plaques no pleural effusion, mild smooth bilateral pleural thickening which is nonspecific  02/04/2018-pulmonary function test- FVC 3.37 (71% predicted), postbronchodilator ratio 50, postbronchodilator FEV1 54, mid  flow reve  . CHF (congestive heart failure) ([place])   . COLONIC POLYPS, HX OF 03/18/2007   Qualifier: Diagnosis of  By: [name]   . Contact dermatitis 01/31/2013  . COPD (chronic obstructive pulmonary disease) ([place])    "dx'd 12/2017"  . COPD GOLD II A  08/25/2018   02/04/2018-high-res CT- no evidence of interstitial lung disease, severe centrilobular emphysema and mild diffuse bronchial wall thickening suggesting COPD, no calcified pleural plaques no pleural effusion, mild smooth bilateral pleural thickening which is nonspecific  02/04/2018-pulmonary function test- FVC 3.37 (71% predicted), postbronchodilator ratio 50, postbronchodilator FEV1 54, mid flow reve  . Coronary artery disease   . Critical aortic valve stenosis 01/12/2018   Calculated valve area less than 0.5 cm mean gradient more than 60 mm of mercury  . DEGENERATIVE JOINT DISEASE, BACK 03/18/2007   Qualifier: Diagnosis of  By: [name]   . Depressed left ventricular ejection fraction 03/28/2020  . Diabetes mellitus ([place]) 07/25/2020  . Dyslipidemia 04/20/2020  . Dyspnea on exertion 01/12/2018  . ERECTILE DYSFUNCTION, ORGANIC 02/10/2009   Qualifier: Diagnosis of  By: [name]   . Former cigarette smoker 10/12/2018   Quit in 2012 41-pack-year smoking history  . Goals of care, counseling/discussion 08/24/2020  . H/O aortic valve replacement 05/05/2020  . Heart murmur   . HEMORRHOIDS, INTERNAL 03/18/2007   Qualifier: Diagnosis of  By: [name]   . HTN (hypertension) 10/21/2007       . Hyperlipemia  10/21/2007   Qualifier: Diagnosis of  By: [name]   . Hypertension   . Hypoglycemia due to insulin 03/26/2020  . Low back pain   . MYOCARDIAL PERFUSION SCAN, WITH STRESS TEST, ABNORMAL 08/11/2008   Qualifier: Diagnosis of  By: Johnsie Cancel, MD, Rona Ravens   . Nocturnal hypoxemia 08/25/2018   04/09/18 >>> ONO - telephone note>>> Patient's ONO on room air showed that he was less than 88% for more than an  hour. Patient needs to be on 2 L at HS.  10/06/2018-overnight oximetry-entire duration was 4 hours and 23 minutes, SPO2 less than 88% for 25 minutes and 32 seconds    . Oxygen deficiency    uses 2L at night  . Paroxysmal atrial fibrillation ([place]) 10/12/2018  . PCP NOTES >>>>>>>>>>>>>>>>> 05/14/2020  . Polycythemia vera ([place]) 08/04/2020  . S/P AVR 03/04/2018   AVR 23 mm Edwards Magna Valve - bioprostetic 03/04/2018 LIMA - LAD  . Status post coronary artery bypass graft 03/20/2018   LIMA to LAD during aortic valve replacement surgery 03/04/2018  . Type 2 diabetes mellitus with hyperglycemia, with long-term current use of insulin ([place]) 04/20/2020  . Type 2 diabetes mellitus, uncontrolled, with neuropathy ([place]) 05/20/2008   Qualifier: Diagnosis of  By: [name]     Current Outpatient Medications  Medication Sig Dispense Refill  . acetaminophen (TYLENOL) 500 MG tablet Take 1,000 mg by mouth every 6 (six) hours as needed for moderate pain or headache.    Marland Kitchen aspirin EC 325 MG EC tablet Take 1 tablet (325 mg total) by mouth daily.  0  . cetirizine (ZYRTEC) 10 MG tablet Take 10 mg by mouth daily as needed for allergies.    . Cyanocobalamin (VITAMIN B-12 PO) Take 1 tablet by mouth daily.    . dapagliflozin propanediol (FARXIGA) 10 MG TABS tablet Take 1 tablet (10 mg total) by mouth daily. 90 tablet 3  . glucose blood (PRODIGY NO CODING BLOOD GLUC) test strip Use as instructed to check blood sugar twice a day.  DX  E11.40 100 each 1  . hydrALAZINE (APRESOLINE) 10 MG tablet Take 1 tablet (10 mg total) by mouth 3 (three) times daily. 270 tablet 1  . insulin aspart (NOVOLOG FLEXPEN) 100 UNIT/ML FlexPen Inject 6 Units into the skin with breakfast, with lunch, and with evening meal. 15 mL 6  . insulin detemir (LEVEMIR FLEXTOUCH) 100 UNIT/ML FlexPen Inject 18 Units into the skin every morning. 15 mL 6  . Insulin Pen Needle 32G X 4 MM MISC 1 Device by Does not apply route as directed. 100 each 6  .  isosorbide mononitrate (IMDUR) 30 MG 24 hr tablet Take 15 mg by mouth daily.    . metFORMIN (GLUCOPHAGE) 1000 MG tablet Take 1 tablet (1,000 mg total) by mouth 2 (two) times daily with a meal. 180 tablet 3  . metoprolol succinate (TOPROL-XL) 50 MG 24 hr tablet Take 50 mg by mouth daily.    . rosuvastatin (CRESTOR) 20 MG tablet Take 1 tablet (20 mg total) by mouth daily. 90 tablet 1  . sacubitril-valsartan (ENTRESTO) 97-103 MG Take 1 tablet by mouth 2 (two) times daily. 60 tablet 4   No current facility-administered medications for this encounter.    Allergies  Allergen Reactions  . Lisinopril Other (See Comments)    Hyperkalemia   . Hydrochlorothiazide Hives  . Amlodipine Besylate Other (See Comments)    REACTION: ? caused left axillary nodules/chest flutter  Social History   Socioeconomic History  . Marital status: Married    Spouse name: Not on file  . Number of children: Not on file  . Years of education: Not on file  . Highest education level: Not on file  Occupational History  . Not on file  Tobacco Use  . Smoking status: Former Smoker    Packs/day: 1.50    Years: 41.00    Pack years: 61.50    Types: Cigarettes    Quit date: 08/26/2010    Years since quitting: 10.2  . Smokeless tobacco: Never Used  . Tobacco comment: Quit 2012  Vaping Use  . Vaping Use: Never used  Substance and Sexual Activity  . Alcohol use: Yes    Comment: occasionally  . Drug use: Not Currently  . Sexual activity: Not Currently  Other Topics Concern  . 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 Difficulty of Paying Living Expenses: Not hard at all  Food Insecurity: No Food Insecurity  . Worried About Charity fundraiser in the Last Year: Never true  . Ran Out of Food in the Last  Year: Never true  Transportation Needs: No Transportation Needs  . Lack of Transportation (Medical): No  . Lack of Transportation (Non-Medical): No  Physical Activity: Not on file  Stress: Not on file  Social Connections: Not on file  Intimate Partner Violence: Not on file      Family History  Problem Relation Age of Onset  . Melanoma Father 55       deceased secondary to melanoma  . Alzheimer's disease Father   . Diabetes Father   . Hypertension Mother        alive -32  . Colon cancer Neg Hx   . Esophageal cancer Neg Hx   . Colon polyps Neg Hx   . Stomach cancer Neg Hx     Vitals:   12/04/20 1204  BP: 124/80  Pulse: 73  SpO2: 95%  Weight: 80.8 kg (178 lb 3.2 oz)    PHYSICAL EXAM: General:  Well appearing. No resp difficulty HEENT: normal Neck: supple. no JVD. Carotids 2+ bilat; no bruits. No lymphadenopathy or thryomegaly appreciated. Cor: PMI nondisplaced. Regular rate & rhythm. No rubs, gallops or murmurs. Lungs: clear Abdomen: soft, nontender, nondistended. No hepatosplenomegaly. No bruits or masses. Good bowel sounds. Extremities: no cyanosis, clubbing, rash, edema Neuro: alert & orientedx3, cranial nerves grossly intact. moves all 4 extremities w/o difficulty. Affect pleasant  ASSESSMENT & PLAN:  1. Chronic systolic HF - primarily NICM. Had LIMA to LAD at time of AVR/CABG 2019 but on cath in 9/21 LAD lesion 65% and LIMA atretic - echo 2019 EF 35-40% - Echo 8/21 EF 25-30% - cMRI 12/21 - LVEF 25% mild RV dysfunction. Minimal LGE - Echo today 12/04/20: EF 40-45% AoV functioning normally Mean gradient 27mmHG. Personally reviewed - Stable NYHA I-II - Continue Entresto 97/103 bid - Continue Toprol 50 daily - Continue spiro 12.5 daily - Continue hydralazine 10 tid and Imdur 15 - Continue Farxiga 10 mg daily - EF now 40-45% no longer qualifies for ICD  2. Severe AS - s/p bioprosthetic AVR 2019 - cath 9/21  with no gradient - Echo today 12/04/20. Valve stable  mean gradient 82mmHG - reinforced need for SBE prophylaxis  3. CAD - Had LIMA to LAD at time of AVR/CABG 2019 but on cath in 9/21 LAD lesion 65% and LIMA atretic - Continue ASA/statin - No s/s angina 4. COPD, Gold II - followed by Pulmonary  - wears 2L O2 at night  - we did hall walk in clinic and sats > 90%  5. DM2 - continue Rhodia Albright, MD  12:18 PM

## 2020-12-04 ENCOUNTER — Ambulatory Visit (HOSPITAL_COMMUNITY)
Admission: RE | Admit: 2020-12-04 | Discharge: 2020-12-04 | Disposition: A | Discharge status: Home / Self Care | Payer: Medicare Other | Source: Ambulatory Visit | Attending: Internal Medicine | Admitting: Internal Medicine

## 2020-12-04 ENCOUNTER — Ambulatory Visit (HOSPITAL_BASED_OUTPATIENT_CLINIC_OR_DEPARTMENT_OTHER)
Admission: RE | Admit: 2020-12-04 | Discharge: 2020-12-04 | Disposition: A | Discharge status: Home / Self Care | Payer: Medicare Other | Source: Ambulatory Visit

## 2020-12-04 ENCOUNTER — Other Ambulatory Visit: Payer: Self-pay

## 2020-12-04 ENCOUNTER — Encounter (HOSPITAL_COMMUNITY): Payer: Self-pay | Admitting: Internal Medicine

## 2020-12-04 VITALS — BP 124/80 | HR 73 | Wt 178.2 lb

## 2020-12-04 DIAGNOSIS — Z794 Long term (current) use of insulin: Secondary | ICD-10-CM | POA: Diagnosis not present

## 2020-12-04 DIAGNOSIS — Z951 Presence of aortocoronary bypass graft: Secondary | ICD-10-CM | POA: Insufficient documentation

## 2020-12-04 DIAGNOSIS — Z7982 Long term (current) use of aspirin: Secondary | ICD-10-CM | POA: Diagnosis not present

## 2020-12-04 DIAGNOSIS — E785 Hyperlipidemia, unspecified: Secondary | ICD-10-CM | POA: Insufficient documentation

## 2020-12-04 DIAGNOSIS — I5022 Chronic systolic (congestive) heart failure: Secondary | ICD-10-CM

## 2020-12-04 DIAGNOSIS — E1165 Type 2 diabetes mellitus with hyperglycemia: Secondary | ICD-10-CM | POA: Diagnosis not present

## 2020-12-04 DIAGNOSIS — Z79899 Other long term (current) drug therapy: Secondary | ICD-10-CM | POA: Insufficient documentation

## 2020-12-04 DIAGNOSIS — I480 Paroxysmal atrial fibrillation: Secondary | ICD-10-CM | POA: Insufficient documentation

## 2020-12-04 DIAGNOSIS — I110 Hypertensive heart disease with heart failure: Secondary | ICD-10-CM | POA: Diagnosis not present

## 2020-12-04 DIAGNOSIS — Z953 Presence of xenogenic heart valve: Secondary | ICD-10-CM | POA: Insufficient documentation

## 2020-12-04 DIAGNOSIS — I428 Other cardiomyopathies: Secondary | ICD-10-CM | POA: Diagnosis not present

## 2020-12-04 DIAGNOSIS — Z952 Presence of prosthetic heart valve: Secondary | ICD-10-CM | POA: Diagnosis not present

## 2020-12-04 DIAGNOSIS — J432 Centrilobular emphysema: Secondary | ICD-10-CM | POA: Diagnosis not present

## 2020-12-04 DIAGNOSIS — Z7984 Long term (current) use of oral hypoglycemic drugs: Secondary | ICD-10-CM | POA: Insufficient documentation

## 2020-12-04 DIAGNOSIS — Z8249 Family history of ischemic heart disease and other diseases of the circulatory system: Secondary | ICD-10-CM | POA: Insufficient documentation

## 2020-12-04 DIAGNOSIS — I2510 Atherosclerotic heart disease of native coronary artery without angina pectoris: Secondary | ICD-10-CM | POA: Diagnosis not present

## 2020-12-04 DIAGNOSIS — E1140 Type 2 diabetes mellitus with diabetic neuropathy, unspecified: Secondary | ICD-10-CM | POA: Diagnosis not present

## 2020-12-04 DIAGNOSIS — Z87891 Personal history of nicotine dependence: Secondary | ICD-10-CM | POA: Diagnosis not present

## 2020-12-04 DIAGNOSIS — IMO0002 Reserved for concepts with insufficient information to code with codable children: Secondary | ICD-10-CM

## 2020-12-04 HISTORY — DX: Heart failure, unspecified: I50.9

## 2020-12-04 LAB — ECHOCARDIOGRAM COMPLETE
AR max vel: 1.57 cm2
AV Area VTI: 1.46 cm2
AV Area mean vel: 1.58 cm2
AV Mean grad: 15 mmHg
AV Peak grad: 27 mmHg
Ao pk vel: 2.6 m/s
Area-P 1/2: 8.25 cm2
Calc EF: 50.4 %
S' Lateral: 3.6 cm
Single Plane A2C EF: 58.2 %
Single Plane A4C EF: 38.6 %

## 2020-12-04 NOTE — Addendum Note (Signed)
Encounter addended by: Scarlette Calico, RN on: 12/04/2020 12:35 PM  Actions taken: Clinical Note Signed

## 2020-12-04 NOTE — Patient Instructions (Signed)
Please call our office in 1 year to schedule your follow up appointment  If you have any questions or concerns before your next appointment please send [place] a message through [place] or call our office at [phone].    TO LEAVE A MESSAGE FOR THE NURSE SELECT OPTION 2, PLEASE LEAVE A MESSAGE INCLUDING: . YOUR NAME . DATE OF BIRTH . CALL BACK NUMBER . REASON FOR CALL**this is important as we prioritize the call backs  [place] AS LONG AS YOU CALL BEFORE 4:00 PM  At the [facility], you and your health needs are our priority. As part of our continuing mission to provide you with exceptional heart care, we have created designated Provider Care Teams. These Care Teams include your primary Cardiologist (physician) and Advanced Practice Providers (APPs- Physician Assistants and Nurse Practitioners) who all work together to provide you with the care you need, when you need it.   You may see any of the following providers on your designated Care Team at your next follow up: Marland Kitchen Dr Glori Bickers . Dr Loralie Champagne . Dr Vickki Muff . Darrick Grinder, NP . Lyda Jester, [place] . Audry Riles, PharmD   Please be sure to bring in all your medications bottles to every appointment.

## 2020-12-04 NOTE — Progress Notes (Signed)
  Echocardiogram 2D Echocardiogram with 3D and strain has been performed.  Darlina Sicilian M 12/04/2020, 11:58 AM

## 2020-12-26 ENCOUNTER — Inpatient Hospital Stay (HOSPITAL_BASED_OUTPATIENT_CLINIC_OR_DEPARTMENT_OTHER): Payer: Medicare Other | Admitting: Hematology & Oncology

## 2020-12-26 ENCOUNTER — Inpatient Hospital Stay: Payer: Medicare Other

## 2020-12-26 ENCOUNTER — Other Ambulatory Visit: Payer: Self-pay

## 2020-12-26 ENCOUNTER — Encounter: Payer: Self-pay | Admitting: Hematology & Oncology

## 2020-12-26 ENCOUNTER — Telehealth: Payer: Self-pay

## 2020-12-26 ENCOUNTER — Inpatient Hospital Stay: Payer: Medicare Other | Attending: Hematology & Oncology

## 2020-12-26 VITALS — BP 175/97 | HR 66 | Temp 98.4°F | Resp 16 | Wt 178.0 lb

## 2020-12-26 DIAGNOSIS — I2510 Atherosclerotic heart disease of native coronary artery without angina pectoris: Secondary | ICD-10-CM | POA: Diagnosis not present

## 2020-12-26 DIAGNOSIS — E611 Iron deficiency: Secondary | ICD-10-CM | POA: Insufficient documentation

## 2020-12-26 DIAGNOSIS — D45 Polycythemia vera: Secondary | ICD-10-CM | POA: Diagnosis not present

## 2020-12-26 DIAGNOSIS — Z79899 Other long term (current) drug therapy: Secondary | ICD-10-CM | POA: Insufficient documentation

## 2020-12-26 LAB — CMP (CANCER CENTER ONLY)
ALT: 8 U/L (ref 0–44)
AST: 12 U/L — ABNORMAL LOW (ref 15–41)
Albumin: 4.1 g/dL (ref 3.5–5.0)
Alkaline Phosphatase: 40 U/L (ref 38–126)
Anion gap: 7 (ref 5–15)
BUN: 13 mg/dL (ref 8–23)
CO2: 28 mmol/L (ref 22–32)
Calcium: 9.3 mg/dL (ref 8.9–10.3)
Chloride: 104 mmol/L (ref 98–111)
Creatinine: 0.96 mg/dL (ref 0.61–1.24)
GFR, Estimated: >60 mL/min (ref >60)
Glucose, Bld: 145 mg/dL — ABNORMAL HIGH (ref 70–99)
Potassium: 4.4 mmol/L (ref 3.5–5.1)
Sodium: 139 mmol/L (ref 135–145)
Total Bilirubin: 0.5 mg/dL (ref 0.3–1.2)
Total Protein: 6.7 g/dL (ref 6.5–8.1)

## 2020-12-26 LAB — CBC WITH DIFFERENTIAL (CANCER CENTER ONLY)
Abs Immature Granulocytes: 0.03 10*3/uL (ref 0.00–0.07)
Basophils Absolute: 0 10*3/uL (ref 0.0–0.1)
Basophils Relative: 1 %
Eosinophils Absolute: 0.1 10*3/uL (ref 0.0–0.5)
Eosinophils Relative: 2 %
HCT: 42.9 % (ref 39.0–52.0)
Hemoglobin: 12.5 g/dL — ABNORMAL LOW (ref 13.0–17.0)
Immature Granulocytes: 0 %
Lymphocytes Relative: 24 %
Lymphs Abs: 1.6 10*3/uL (ref 0.7–4.0)
MCH: 18.3 pg — ABNORMAL LOW (ref 26.0–34.0)
MCHC: 29.1 g/dL — ABNORMAL LOW (ref 30.0–36.0)
MCV: 62.9 fL — ABNORMAL LOW (ref 80.0–100.0)
Monocytes Absolute: 0.7 10*3/uL (ref 0.1–1.0)
Monocytes Relative: 10 %
Neutro Abs: 4.4 10*3/uL (ref 1.7–7.7)
Neutrophils Relative %: 63 %
Platelet Count: 400 10*3/uL (ref 150–400)
RBC: 6.82 MIL/uL — ABNORMAL HIGH (ref 4.22–5.81)
RDW: 23.9 % — ABNORMAL HIGH (ref 11.5–15.5)
WBC Count: 6.9 10*3/uL (ref 4.0–10.5)
nRBC: 0 % (ref 0.0–0.2)

## 2020-12-26 LAB — FERRITIN: Ferritin: 7 ng/mL — ABNORMAL LOW (ref 24–336)

## 2020-12-26 LAB — IRON AND TIBC
Iron: 28 ug/dL — ABNORMAL LOW (ref 45–182)
Saturation Ratios: 7 % — ABNORMAL LOW (ref 17.9–39.5)
TIBC: 397 ug/dL (ref 250–450)
UIBC: 369 ug/dL

## 2020-12-26 LAB — LACTATE DEHYDROGENASE: LDH: 158 U/L (ref 98–192)

## 2020-12-26 NOTE — Telephone Encounter (Signed)
Called pt per 12/26/20 los and his vm is full, a calendar has been mailed to the pt   [name]

## 2020-12-26 NOTE — Progress Notes (Signed)
Hematology and Oncology Follow Up Visit  [name] [id number] [birth year] 70 y.o. 12/26/2020   Principle Diagnosis:   Polycythemia vera- JAK2 (+)  Current Therapy:    Phlebotomy to maintain hematocrit less than 45%  Aspirin 325 mg p.o. daily-on full dose for cardiac issues     Interim History:  [name] is back for follow-up.  Everything is doing quite well with him.  He has been quite busy.  He looks good.  He is getting ready [place] go [place] in [place] go [employer].  I am sure he will enjoy this.  He will go down to [place] part of the summer for a visit to [place].  He had problems with one of his diabetic medications.  He was on [place] and apparently had the dose increased.  This developed a rash.  He stopped the [place].  His last iron studies back in February showed a ferritin of 9 with an iron saturation of 7%.  He has had no problems with fever.  He has had no cough or shortness of breath.  There has been no change in bowel or bladder habits.  He has had no nausea or vomiting.  Overall, his performance status is ECOG 0.       Medications:  Current Outpatient Medications:  .  acetaminophen (TYLENOL) 500 MG tablet, Take 1,000 mg by mouth every 6 (six) hours as needed for moderate pain or headache., Disp: , Rfl:  .  aspirin EC 325 MG EC tablet, Take 1 tablet (325 mg total) by mouth daily., Disp: , Rfl: 0 .  cetirizine (ZYRTEC) 10 MG tablet, Take 10 mg by mouth daily as needed for allergies., Disp: , Rfl:  .  Cyanocobalamin (VITAMIN B-12 PO), Take 1 tablet by mouth daily., Disp: , Rfl:  .  glucose blood (PRODIGY NO CODING BLOOD GLUC) test strip, Use as instructed to check blood sugar twice a day.  DX  E11.40, Disp: 100 each, Rfl: 1 .  hydrALAZINE (APRESOLINE) 10 MG tablet, Take 1 tablet (10 mg total) by mouth 3 (three) times daily., Disp: 270 tablet, Rfl: 1 .  insulin aspart (NOVOLOG FLEXPEN) 100 UNIT/ML FlexPen, Inject 6 Units into  the skin with breakfast, with lunch, and with evening meal., Disp: 15 mL, Rfl: 6 .  insulin detemir (LEVEMIR FLEXTOUCH) 100 UNIT/ML FlexPen, Inject 18 Units into the skin every morning., Disp: 15 mL, Rfl: 6 .  Insulin Pen Needle 32G X 4 MM MISC, 1 Device by Does not apply route as directed., Disp: 100 each, Rfl: 6 .  isosorbide mononitrate (IMDUR) 30 MG 24 hr tablet, Take 15 mg by mouth daily., Disp: , Rfl:  .  metFORMIN (GLUCOPHAGE) 1000 MG tablet, Take 1 tablet (1,000 mg total) by mouth 2 (two) times daily with a meal., Disp: 180 tablet, Rfl: 3 .  metoprolol succinate (TOPROL-XL) 50 MG 24 hr tablet, Take 50 mg by mouth daily., Disp: , Rfl:  .  rosuvastatin (CRESTOR) 20 MG tablet, Take 1 tablet (20 mg total) by mouth daily., Disp: 90 tablet, Rfl: 1 .  sacubitril-valsartan (ENTRESTO) 97-103 MG, Take 1 tablet by mouth 2 (two) times daily., Disp: 60 tablet, Rfl: 4 .  spironolactone (ALDACTONE) 25 MG tablet, Take 0.5 tablets by mouth daily., Disp: , Rfl:   Allergies:  Allergies  Allergen Reactions  . Lisinopril Other (See Comments)    Hyperkalemia   . Hydrochlorothiazide Hives  . Amlodipine Besylate Other (See Comments)  REACTION: ? caused left axillary nodules/chest flutter  . Wilder Glade [Dapagliflozin] Rash and Other (See Comments)    "swollen penis"    Past Medical History, Surgical history, Social history, and Family History were reviewed and updated.  Review of Systems: Review of Systems  Constitutional: Negative.   HENT:  Negative.   Eyes: Negative.   Respiratory: Negative.   Cardiovascular: Negative.   Gastrointestinal: Negative.   Endocrine: Negative.   Genitourinary: Negative.    Musculoskeletal: Negative.   Skin: Negative.   Neurological: Negative.   Hematological: Negative.   Psychiatric/Behavioral: Negative.     Physical Exam:  weight is 178 lb (80.7 kg). His oral temperature is 98.4 F (36.9 C). His blood pressure is 175/97 (abnormal) and his pulse is 66. His  respiration is 16 and oxygen saturation is 96%.   Wt Readings from Last 3 Encounters:  12/26/20 178 lb (80.7 kg)  12/04/20 178 lb 3.2 oz (80.8 kg)  11/13/20 175 lb 1.6 oz (79.4 kg)    Physical Exam Vitals reviewed.  HENT:     Head: Normocephalic and atraumatic.  Eyes:     Pupils: Pupils are equal, round, and reactive to light.  Cardiovascular:     Rate and Rhythm: Normal rate and regular rhythm.     Heart sounds: Normal heart sounds.  Pulmonary:     Effort: Pulmonary effort is normal.     Breath sounds: Normal breath sounds.  Abdominal:     General: Bowel sounds are normal.     Palpations: Abdomen is soft.  Musculoskeletal:        General: No tenderness or deformity. Normal range of motion.     Cervical back: Normal range of motion.  Lymphadenopathy:     Cervical: No cervical adenopathy.  Skin:    General: Skin is warm and dry.     Findings: No erythema or rash.  Neurological:     Mental Status: He is alert and oriented to person, place, and time.  Psychiatric:        Behavior: Behavior normal.        Thought Content: Thought content normal.        Judgment: Judgment normal.    Lab Results  Component Value Date   WBC 6.9 12/26/2020   HGB 12.5 (L) 12/26/2020   HCT 42.9 12/26/2020   MCV 62.9 (L) 12/26/2020   PLT 400 12/26/2020     Chemistry      Component Value Date/Time   NA 139 12/26/2020 0934   NA 142 06/07/2020 1111   K 4.4 12/26/2020 0934   CL 104 12/26/2020 0934   CO2 28 12/26/2020 0934   BUN 13 12/26/2020 0934   BUN 10 06/07/2020 1111   CREATININE 0.96 12/26/2020 0934   CREATININE 0.84 11/30/2013 0856      Component Value Date/Time   CALCIUM 9.3 12/26/2020 0934   ALKPHOS 40 12/26/2020 0934   AST 12 (L) 12/26/2020 0934   ALT 8 12/26/2020 0934   BILITOT 0.5 12/26/2020 0934      Impression and Plan: [name] is a very nice 70 year old African-American male.  He has polycythemia vera.  This is by virtue of him having the positive JAK2  assay.  I had to suspect that he is probably iron deficient because of the polycythemia.  He is on full dose aspirin because of his cardiac issues.  I think we can get him back in 3 months.  I think this would be very reasonable.  He really  only needs to be phlebotomized every 6 months.  [name] Napoleon, MD 5/3/202211:00 AM

## 2020-12-26 NOTE — Progress Notes (Signed)
Pt. States that he stopped taking [place] because he developed a rash in between his legs and his penis got swollen. Resolved after he stopped taking it.

## 2021-01-24 ENCOUNTER — Other Ambulatory Visit: Payer: Self-pay | Admitting: Internal Medicine

## 2021-02-03 ENCOUNTER — Other Ambulatory Visit (HOSPITAL_COMMUNITY): Payer: Self-pay | Admitting: Internal Medicine

## 2021-02-13 ENCOUNTER — Ambulatory Visit (INDEPENDENT_AMBULATORY_CARE_PROVIDER_SITE_OTHER): Payer: Medicare Other | Admitting: Internal Medicine

## 2021-02-13 ENCOUNTER — Other Ambulatory Visit: Payer: Self-pay

## 2021-02-13 VITALS — BP 137/83 | HR 88 | Temp 98.2°F | Resp 12 | Ht 71.0 in | Wt 174.6 lb

## 2021-02-13 DIAGNOSIS — E1159 Type 2 diabetes mellitus with other circulatory complications: Secondary | ICD-10-CM | POA: Diagnosis not present

## 2021-02-13 DIAGNOSIS — E1165 Type 2 diabetes mellitus with hyperglycemia: Secondary | ICD-10-CM

## 2021-02-13 DIAGNOSIS — Z794 Long term (current) use of insulin: Secondary | ICD-10-CM

## 2021-02-13 LAB — POCT GLYCOSYLATED HEMOGLOBIN (HGB A1C): Hemoglobin A1C: 7.4 % — AB (ref 4.0–5.6)

## 2021-02-13 MED ORDER — BD PEN NEEDLE NANO 2ND GEN 32G X 4 MM MISC: 1.0000 | Freq: Four times a day (QID) | 3 refills | Status: DC

## 2021-02-13 MED ORDER — METFORMIN HCL 1000 MG PO TABS: 1000.0000 mg | ORAL_TABLET | Freq: Two times a day (BID) | ORAL | 3 refills | Status: DC

## 2021-02-13 MED ORDER — LEVEMIR FLEXTOUCH 100 UNIT/ML ~~LOC~~ SOPN: 18.0000 [IU] | PEN_INJECTOR | Freq: Every morning | SUBCUTANEOUS | 4 refills | Status: DC

## 2021-02-13 MED ORDER — NOVOLOG FLEXPEN 100 UNIT/ML ~~LOC~~ SOPN: 6.0000 [IU] | PEN_INJECTOR | Freq: Three times a day (TID) | SUBCUTANEOUS | 4 refills | Status: DC

## 2021-02-13 NOTE — Progress Notes (Signed)
Name: [name]  Age/ Sex: 70 y.o., male   MRN/ DOB: [id number], 11-Jan-1951     PCP: Colon Branch, MD   Reason for Endocrinology Evaluation: Type 2 Diabetes Mellitus  Initial Endocrine Consultative Visit: 04/19/2020    PATIENT IDENTIFIER: [name] is a 70 y.o. male with a past medical history of COPD, non-obstructive CAD, severe AS ( S/P AVR/CABG), CHF, HTN , T2DM and OSA. The patient has followed with Endocrinology clinic since 04/19/2020 for consultative assistance with management of his diabetes.  DIABETIC HISTORY:  Mr. Senn was diagnosed with DM in 2009. He has been on metformin and insulin for years.Metfromin dose was reduced by 50% due to hypoglycemia per pt.  His hemoglobin A1c has ranged from 7.2% in 2021, peaking at 8.8%in 2018  On his initial visit to our clinic his A1c was 7.2 % We adjusted MDI regimen and continued metformin   Due to a low BMI of 20.99 we checked GAD-65 and islet cell Ab's which were negative    Prandial insulin stopped 10/2020  SUBJECTIVE:   During the last visit (10/31/2020): A1c 6.5 % Stopped prandial insulin, increased Metformin and Farxiga and continued basal insulin     Today (02/13/2021): Mr. Althaus is here for a follow up on diabetes. He checks his blood sugars a 1 times daily. The patient has not  had hypoglycemic episodes since the last clinic visit.  He is S/P cardiac cath with  in 04/2020  Denies nausea or diarrhea but he developed genital rash that was itchy and uncomfortable so he stopped [place] and restarted Novolog    HOME DIABETES REGIMEN:  Levemir 18 units daily  Metformin 1000 mg , BID Farxiga 10 mg daily - stopped it  Novolog 6 units with each meal   Statin: yes ACE-I/ARB: yes   METER DOWNLOAD SUMMARY: Prodigy 7 day average 112 mg/dL   86- 164 mg/dL   DIABETIC COMPLICATIONS: Microvascular complications:  Denies: CKD, retinopathy , neuropathy  Last eye exam: Completed 08/2019    Macrovascular complications:  CAD Denies: PVD, CVA   HISTORY:  Past Medical History:  Past Medical History:  Diagnosis Date   Abnormal myocardial perfusion study 2006   EF 44% ? inferoseptal ischemia. no cath done   Benign neoplasm of cecum    Breast mass, right 03/2008   Cardiomyopathy ([place]) 07/20/2018   Ejection fraction 4045% in September 2019   Centrilobular emphysema ([place]) 08/25/2018   02/04/2018-high-res CT- no evidence of interstitial lung disease, severe centrilobular emphysema and mild diffuse bronchial wall thickening suggesting COPD, no calcified pleural plaques no pleural effusion, mild smooth bilateral pleural thickening which is nonspecific  02/04/2018-pulmonary function test- FVC 3.37 (71% predicted), postbronchodilator ratio 50, postbronchodilator FEV1 54, mid flow reve   CHF (congestive heart failure) ([place])    COLONIC POLYPS, HX OF 03/18/2007   Qualifier: Diagnosis of  By: [name]    Contact dermatitis 01/31/2013   COPD (chronic obstructive pulmonary disease) ([place])    "dx'd 12/2017"   COPD GOLD II A  08/25/2018   02/04/2018-high-res CT- no evidence of interstitial lung disease, severe centrilobular emphysema and mild diffuse bronchial wall thickening suggesting COPD, no calcified pleural plaques no pleural effusion, mild smooth bilateral pleural thickening which is nonspecific  02/04/2018-pulmonary function test- FVC 3.37 (71% predicted), postbronchodilator ratio 50, postbronchodilator FEV1 54, mid flow reve   Coronary artery disease    Critical aortic valve stenosis 01/12/2018   Calculated valve area less than 0.5  cm mean gradient more than 60 mm of mercury   DEGENERATIVE JOINT DISEASE, BACK 03/18/2007   Qualifier: Diagnosis of  By: [name]    Depressed left ventricular ejection fraction 03/28/2020   Diabetes mellitus ([place]) 07/25/2020   Dyslipidemia 04/20/2020   Dyspnea on exertion 01/12/2018   ERECTILE DYSFUNCTION, ORGANIC 02/10/2009   Qualifier: Diagnosis  of  By: [name]    Former cigarette smoker 10/12/2018   Quit in 2012 41-pack-year smoking history   Goals of care, counseling/discussion 08/24/2020   H/O aortic valve replacement 05/05/2020   Heart murmur    HEMORRHOIDS, INTERNAL 03/18/2007   Qualifier: Diagnosis of  By: [name]    HTN (hypertension) 10/21/2007        Hyperlipemia 10/21/2007   Qualifier: Diagnosis of  By: [name]    Hypertension    Hypoglycemia due to insulin 03/26/2020   Low back pain    MYOCARDIAL PERFUSION SCAN, WITH STRESS TEST, ABNORMAL 08/11/2008   Qualifier: Diagnosis of  By: Johnsie Cancel, MD, Rona Ravens    Nocturnal hypoxemia 08/25/2018   04/09/18 >>> ONO - telephone note>>> Patient's ONO on room air showed that he was less than 88% for more than an hour. Patient needs to be on 2 L at HS.  10/06/2018-overnight oximetry-entire duration was 4 hours and 23 minutes, SPO2 less than 88% for 25 minutes and 32 seconds     Oxygen deficiency    uses 2L at night   Paroxysmal atrial fibrillation ([place]) 10/12/2018   PCP NOTES >>>>>>>>>>>>>>>>> 05/14/2020   Polycythemia vera ([place]) 08/04/2020   S/P AVR 03/04/2018   AVR 23 mm Edwards Magna Valve - bioprostetic 03/04/2018 LIMA - LAD   Status post coronary artery bypass graft 03/20/2018   LIMA to LAD during aortic valve replacement surgery 03/04/2018   Type 2 diabetes mellitus with hyperglycemia, with long-term current use of insulin ([place]) 04/20/2020   Type 2 diabetes mellitus, uncontrolled, with neuropathy ([place]) 05/20/2008   Qualifier: Diagnosis of  By: [name]    Past Surgical History:  Past Surgical History:  Procedure Laterality Date   ABDOMINAL AORTOGRAM N/A 01/13/2018   Procedure: ABDOMINAL AORTOGRAM;  Surgeon: Troy Sine, MD;  Location: [facility];  Service: Cardiovascular;  Laterality: N/A;   AORTIC VALVE REPLACEMENT N/A 03/04/2018   Procedure: AORTIC VALVE REPLACEMENT (AVR) 29mm Edwards Magna Ease Tissue Valve.;  Surgeon:  Melrose Nakayama, MD;  Location: [facility];  Service: Open Heart Surgery;  Laterality: N/A;   CARDIAC VALVE REPLACEMENT     COLONOSCOPY W/ POLYPECTOMY     COLONOSCOPY WITH PROPOFOL N/A 02/22/2020   Procedure: COLONOSCOPY WITH PROPOFOL;  Surgeon: Doran Stabler, MD;  [facility]: [facility];  Service: Gastroenterology;  Laterality: N/A;   CORONARY ARTERY BYPASS GRAFT N/A 03/04/2018   Procedure: CORONARY ARTERY BYPASS GRAFTING (CABG) x1:  LIMA to LAD.;  Surgeon: Melrose Nakayama, MD;  Location: [facility];  Service: Open Heart Surgery;  Laterality: N/A;   HEMOSTASIS CLIP PLACEMENT  02/22/2020   Procedure: HEMOSTASIS CLIP PLACEMENT;  Surgeon: Doran Stabler, MD;  Location: [facility];  Service: Gastroenterology;;   INTRAVASCULAR PRESSURE WIRE/FFR STUDY N/A 05/10/2020   Procedure: INTRAVASCULAR PRESSURE WIRE/FFR STUDY;  Surgeon: Sherren Mocha, MD;  Location: [facility];  Service: Cardiovascular;  Laterality: N/A;   LEFT HEART CATH AND CORS/GRAFTS ANGIOGRAPHY N/A 05/10/2020   Procedure: LEFT HEART CATH AND CORS/GRAFTS ANGIOGRAPHY;  Surgeon:  Sherren Mocha, MD;  Location: [facility];  Service: Cardiovascular;  Laterality: N/A;   LIPOMA EXCISION Left 07/2008    "back of shoulder" Dr Gershon Crane   POLYPECTOMY  02/22/2020   Procedure: POLYPECTOMY;  Surgeon: Doran Stabler, MD;  [facility];  Service: Gastroenterology;;   RIGHT/LEFT HEART CATH AND CORONARY ANGIOGRAPHY N/A 01/13/2018   Procedure: RIGHT/LEFT HEART CATH AND CORONARY ANGIOGRAPHY;  Surgeon: Troy Sine, MD;  Location: [facility];  Service: Cardiovascular;  Laterality: N/A;   TEE WITHOUT CARDIOVERSION N/A 03/04/2018   Procedure: TRANSESOPHAGEAL ECHOCARDIOGRAM (TEE);  Surgeon: Melrose Nakayama, MD;  Location: [facility];  Service: Open Heart Surgery;  Laterality: N/A;   TONSILLECTOMY     WISDOM TOOTH EXTRACTION     Social History:  reports that he quit smoking about 10 years ago. His smoking use  included cigarettes. He has a 61.50 pack-year smoking history. He has never used smokeless tobacco. He reports current alcohol use. He reports previous drug use. Family History:  Family History  Problem Relation Age of Onset   Melanoma Father 82       deceased secondary to melanoma   Alzheimer's disease Father    Diabetes Father    Hypertension Mother        alive -24   Colon cancer Neg Hx    Esophageal cancer Neg Hx    Colon polyps Neg Hx    Stomach cancer Neg Hx      HOME MEDICATIONS: Allergies as of 02/13/2021       Reactions   Lisinopril Other (See Comments)   Hyperkalemia   Hydrochlorothiazide Hives   Amlodipine Besylate Other (See Comments)   REACTION: ? caused left axillary nodules/chest flutter   Farxiga [dapagliflozin] Rash, Other (See Comments)   "swollen penis"        Medication List        Accurate as of February 13, 2021  9:17 AM. If you have any questions, ask your nurse or doctor.          acetaminophen 500 MG tablet Commonly known as: TYLENOL Take 1,000 mg by mouth every 6 (six) hours as needed for moderate pain or headache.   aspirin 325 MG EC tablet Take 1 tablet (325 mg total) by mouth daily.   BD Pen Needle Nano 2nd Gen 32G X 4 MM Misc Generic drug: Insulin Pen Needle USE AS DIRECTED   cetirizine 10 MG tablet Commonly known as: ZYRTEC Take 10 mg by mouth daily as needed for allergies.   Entresto 97-103 MG Generic drug: sacubitril-valsartan TAKE 1 TABLET BY MOUTH TWICE A DAY   glucose blood test strip Commonly known as: Prodigy No Coding Blood Gluc Use as instructed to check blood sugar twice a day.  DX  E11.40   hydrALAZINE 10 MG tablet Commonly known as: APRESOLINE Take 1 tablet (10 mg total) by mouth 3 (three) times daily.   isosorbide mononitrate 30 MG 24 hr tablet Commonly known as: IMDUR Take 15 mg by mouth daily.   Levemir FlexTouch 100 UNIT/ML FlexPen Generic drug: insulin detemir Inject 18 Units into the skin every  morning.   metFORMIN 1000 MG tablet Commonly known as: GLUCOPHAGE Take 1 tablet (1,000 mg total) by mouth 2 (two) times daily with a meal.   metoprolol succinate 50 MG 24 hr tablet Commonly known as: TOPROL-XL Take 50 mg by mouth daily.   NovoLOG FlexPen 100 UNIT/ML FlexPen Generic drug: insulin aspart Inject 6 Units into  the skin with breakfast, with lunch, and with evening meal.   rosuvastatin 20 MG tablet Commonly known as: CRESTOR Take 1 tablet (20 mg total) by mouth daily.   spironolactone 25 MG tablet Commonly known as: ALDACTONE Take 0.5 tablets by mouth daily.   VITAMIN B-12 PO Take 1 tablet by mouth daily.         OBJECTIVE:   Vital Signs: BP 137/83 (BP Location: Right Arm, Cuff Size: Normal)   Pulse 88   Temp 98.2 F (36.8 C)   Resp 12   Ht 5\' 11"  (1.803 m)   Wt 174 lb 9.6 oz (79.2 kg)   SpO2 99%   BMI 24.35 kg/m   Wt Readings from Last 3 Encounters:  02/13/21 174 lb 9.6 oz (79.2 kg)  12/26/20 178 lb (80.7 kg)  12/04/20 178 lb 3.2 oz (80.8 kg)     Exam: General: Pt appears well and is in NAD  Lungs: Clear with good BS bilat  Heart: RRR   Extremities: No pretibial edema.   Neuro: MS is good with appropriate affect, pt is alert and Ox3       DM Foot Exam 02/13/2021  The skin of the feet is intact without sores or ulcerations. The pedal pulses are 2+ on right and 2+ on left. The sensation is intact to a screening 5.07, 10 gram monofilament bilaterally    DATA REVIEWED: Results for KHALI, ALBANESE (MRN [id number]) as of 02/13/2021 07:21  Ref. Range 12/26/2020 09:34  Sodium Latest Ref Range: 135 - 145 mmol/L 139  Potassium Latest Ref Range: 3.5 - 5.1 mmol/L 4.4  Chloride Latest Ref Range: 98 - 111 mmol/L 104  CO2 Latest Ref Range: 22 - 32 mmol/L 28  Glucose Latest Ref Range: 70 - 99 mg/dL 145 (H)  BUN Latest Ref Range: 8 - 23 mg/dL 13  Creatinine Latest Ref Range: 0.61 - 1.24 mg/dL 0.96  Calcium Latest Ref Range: 8.9 - 10.3 mg/dL 9.3   Anion gap Latest Ref Range: 5 - 15  7  Alkaline Phosphatase Latest Ref Range: 38 - 126 U/L 40  Albumin Latest Ref Range: 3.5 - 5.0 g/dL 4.1  AST Latest Ref Range: 15 - 41 U/L 12 (L)  ALT Latest Ref Range: 0 - 44 U/L 8  Total Protein Latest Ref Range: 6.5 - 8.1 g/dL 6.7  Total Bilirubin Latest Ref Range: 0.3 - 1.2 mg/dL 0.5  GFR, Est Non African American Latest Ref Range: >60 mL/min >60    Lab Results  Component Value Date   HGBA1C 6.5 (A) 10/31/2020   HGBA1C 7.5 (A) 07/25/2020   HGBA1C 7.2 (H) 03/23/2020   Lab Results  Component Value Date   MICROALBUR 0.9 04/19/2020   LDLCALC 56 09/13/2020   CREATININE 0.96 12/26/2020   Lab Results  Component Value Date   MICRALBCREAT 7 04/19/2020     Lab Results  Component Value Date   CHOL 115 09/13/2020   HDL 44.30 09/13/2020   LDLCALC 56 09/13/2020   LDLDIRECT 177.6 10/07/2007   TRIG 69.0 09/13/2020   CHOLHDL 3 09/13/2020       ISLET CELL ANTIBODY SCREEN NEGATIVE NEGATIVE    Glutamic Acid Decarb Ab <5 IU/mL <5      ASSESSMENT / PLAN / RECOMMENDATIONS:   1) Type 2 Diabetes Mellitus, Sub- Optimally controlled, With macrovascular  complications - Most recent A1c of 7.4 %. Goal A1c < 7.0 %.    - He had to stop the Iran due to severe  genital infection/irritation, he went back on the prandial insulin , I suspect this is why his A1c has trended up from 6.5 %  - He declines CGM technology as he is concerned he will knock it off with the type of the work he does  - We discussed add-on therapy with GLP-1 agonists but he declines at this time, he is comfortable using insulin and is not having side effects from it . He was encouraged to check glucose before each meal and use the correction scale if needed     MEDICATIONS:  - Continue Metformin 1000 mg, 1 tablet BID - Continue  Levemir  18 units daily  - Continue Novolog 6 units with each meal  - AL:PFXT (BG- 135/40)  EDUCATION / INSTRUCTIONS: BG monitoring instructions:  Patient is instructed to check his blood sugars 3 times a day, before meals  Call [facility] if: BG persistently < 100  I reviewed the Rule of 15 for the treatment of hypoglycemia in detail with the patient. Literature supplied.     2) Diabetic complications:  Eye: Does not have known diabetic retinopathy.  Neuro/ Feet: Does not have known diabetic peripheral neuropathy .  Renal: Patient does not have known baseline CKD. He   is  on an ACEI/ARB at present.   F/U in 4 months    Signed electronically by: Mack Guise, MD  [facility]  [address] [place], [place] [address] Phone: [phone] FAX: [phone]   CC: Colon Branch, [address] [place] [address] Phone: [phone]  Fax: [phone]  Return to Endocrinology clinic as below: Future Appointments  Date Time Provider [place]  02/13/2021  9:30 AM Annick Dimaio, Melanie Crazier, MD [facility]  03/28/2021  9:45 AM [facility] None  03/28/2021 10:15 AM Marin Olp, Rudell Cobb, MD [facility] None  03/28/2021 10:45 AM [facility] A4 [facility] None  05/23/2021 10:00 AM Colon Branch, MD [facility]  05/23/2021 11:20 AM Tobb, Godfrey Pick, DO [facility] None

## 2021-02-13 NOTE — Patient Instructions (Addendum)
-   Continue  Metformin 1000 mg, 1 tablet TWICE a day  - Continue  Levemir 18 units daily  - Continue NOVOLOG 6 units with each meal anymore,   - Novlog correctional insulin: ADD extra units on insulin to your meal-time Novolog  dose if your blood sugars are higher than 185. Use the scale below to help guide you:     Blood sugar before meal Number of units to inject  Less than 185 0 unit  186 - 225 1 units  226 -  265 2 units  265 -  305 3 units  306 -  345 4 units       HOW TO TREAT LOW BLOOD SUGARS (Blood sugar LESS THAN 70 MG/DL) Please follow the RULE OF 15 for the treatment of hypoglycemia treatment (when your (blood sugars are less than 70 mg/dL)   STEP 1: Take 15 grams of carbohydrates when your blood sugar is low, which includes:  3-4 GLUCOSE TABS  OR 3-4 OZ OF JUICE OR REGULAR SODA OR ONE TUBE OF GLUCOSE GEL    STEP 2: RECHECK blood sugar in 15 MINUTES STEP 3: If your blood sugar is still low at the 15 minute recheck --> then, go back to STEP 1 and treat AGAIN with another 15 grams of carbohydrates.     HOW TO TREAT LOW BLOOD SUGARS (Blood sugar LESS THAN 70 MG/DL) Please follow the RULE OF 15 for the treatment of hypoglycemia treatment (when your (blood sugars are less than 70 mg/dL)   STEP 1: Take 15 grams of carbohydrates when your blood sugar is low, which includes:  3-4 GLUCOSE TABS  OR 3-4 OZ OF JUICE OR REGULAR SODA OR ONE TUBE OF GLUCOSE GEL    STEP 2: RECHECK blood sugar in 15 MINUTES STEP 3: If your blood sugar is still low at the 15 minute recheck --> then, go back to STEP 1 and treat AGAIN with another 15 grams of carbohydrates.

## 2021-03-01 ENCOUNTER — Other Ambulatory Visit: Payer: Self-pay | Admitting: Cardiology

## 2021-03-28 ENCOUNTER — Inpatient Hospital Stay: Payer: Medicare Other | Attending: Hematology & Oncology

## 2021-03-28 ENCOUNTER — Other Ambulatory Visit: Payer: Self-pay

## 2021-03-28 ENCOUNTER — Encounter: Payer: Self-pay | Admitting: Hematology & Oncology

## 2021-03-28 ENCOUNTER — Inpatient Hospital Stay: Payer: Medicare Other

## 2021-03-28 ENCOUNTER — Telehealth: Payer: Self-pay

## 2021-03-28 ENCOUNTER — Inpatient Hospital Stay (HOSPITAL_BASED_OUTPATIENT_CLINIC_OR_DEPARTMENT_OTHER): Payer: Medicare Other | Admitting: Hematology & Oncology

## 2021-03-28 VITALS — BP 120/75 | HR 72 | Temp 98.6°F | Resp 18

## 2021-03-28 VITALS — BP 144/86 | HR 73 | Temp 98.7°F | Resp 20 | Wt 171.8 lb

## 2021-03-28 DIAGNOSIS — Z7982 Long term (current) use of aspirin: Secondary | ICD-10-CM | POA: Insufficient documentation

## 2021-03-28 DIAGNOSIS — Z79899 Other long term (current) drug therapy: Secondary | ICD-10-CM | POA: Insufficient documentation

## 2021-03-28 DIAGNOSIS — D45 Polycythemia vera: Secondary | ICD-10-CM

## 2021-03-28 LAB — CMP (CANCER CENTER ONLY)
ALT: 8 U/L (ref 0–44)
AST: 14 U/L — ABNORMAL LOW (ref 15–41)
Albumin: 4.3 g/dL (ref 3.5–5.0)
Alkaline Phosphatase: 40 U/L (ref 38–126)
Anion gap: 10 (ref 5–15)
BUN: 15 mg/dL (ref 8–23)
CO2: 28 mmol/L (ref 22–32)
Calcium: 9.6 mg/dL (ref 8.9–10.3)
Chloride: 103 mmol/L (ref 98–111)
Creatinine: 0.98 mg/dL (ref 0.61–1.24)
GFR, Estimated: >60 mL/min (ref >60)
Glucose, Bld: 105 mg/dL — ABNORMAL HIGH (ref 70–99)
Potassium: 4.1 mmol/L (ref 3.5–5.1)
Sodium: 141 mmol/L (ref 135–145)
Total Bilirubin: 0.5 mg/dL (ref 0.3–1.2)
Total Protein: 7.2 g/dL (ref 6.5–8.1)

## 2021-03-28 LAB — CBC WITH DIFFERENTIAL (CANCER CENTER ONLY)
Abs Immature Granulocytes: 0.06 10*3/uL (ref 0.00–0.07)
Basophils Absolute: 0.1 10*3/uL (ref 0.0–0.1)
Basophils Relative: 1 %
Eosinophils Absolute: 0.1 10*3/uL (ref 0.0–0.5)
Eosinophils Relative: 1 %
HCT: 45.8 % (ref 39.0–52.0)
Hemoglobin: 13.7 g/dL (ref 13.0–17.0)
Immature Granulocytes: 1 %
Lymphocytes Relative: 26 %
Lymphs Abs: 2 10*3/uL (ref 0.7–4.0)
MCH: 19.2 pg — ABNORMAL LOW (ref 26.0–34.0)
MCHC: 29.9 g/dL — ABNORMAL LOW (ref 30.0–36.0)
MCV: 64.2 fL — ABNORMAL LOW (ref 80.0–100.0)
Monocytes Absolute: 0.8 10*3/uL (ref 0.1–1.0)
Monocytes Relative: 10 %
Neutro Abs: 4.7 10*3/uL (ref 1.7–7.7)
Neutrophils Relative %: 61 %
Platelet Count: 440 10*3/uL — ABNORMAL HIGH (ref 150–400)
RBC: 7.13 MIL/uL — ABNORMAL HIGH (ref 4.22–5.81)
RDW: 22.6 % — ABNORMAL HIGH (ref 11.5–15.5)
WBC Count: 7.7 10*3/uL (ref 4.0–10.5)
nRBC: 0 % (ref 0.0–0.2)

## 2021-03-28 LAB — RETICULOCYTES
Immature Retic Fract: 18.3 % — ABNORMAL HIGH (ref 2.3–15.9)
RBC.: 7.05 MIL/uL — ABNORMAL HIGH (ref 4.22–5.81)
Retic Count, Absolute: 74 10*3/uL (ref 19.0–186.0)
Retic Ct Pct: 1.1 % (ref 0.4–3.1)

## 2021-03-28 NOTE — Progress Notes (Signed)
Hematology and Oncology Follow Up Visit  FAIN PINN [id number] Jun 21, 1951 70 y.o. 03/28/2021   Principle Diagnosis:  Polycythemia vera- JAK2 (+)  Current Therapy:   Phlebotomy to maintain hematocrit less than 45% Aspirin 325 mg p.o. daily-on full dose for cardiac issues     Interim History:  [name] is back for follow-up.  We last saw him back in May.  His last phlebotomy I think was back in February.  He has had a good summer.  He went down to [place] and did some fishing [place] the [place].  He really enjoyed himself.  Otherwise he is doing quite nicely.  He has no problems with chest pain.  There is no cough or shortness of breath.  He has had no issues with COVID.  He does have diabetes.  He is watching his diabetes closely.  He does have iron deficiency from his phlebotomies.  Back in May, his ferritin was 7 with iron saturation of 7%.  He has had no leg swelling.  He has had no neuropathy in his hands or feet.  He has had no change in bowel bladder habits.  Overall, his performance status is ECOG 1.    Medications:  Current Outpatient Medications:    acetaminophen (TYLENOL) 500 MG tablet, Take 1,000 mg by mouth every 6 (six) hours as needed for moderate pain or headache., Disp: , Rfl:    aspirin EC 325 MG EC tablet, Take 1 tablet (325 mg total) by mouth daily., Disp: , Rfl: 0   cetirizine (ZYRTEC) 10 MG tablet, Take 10 mg by mouth daily as needed for allergies., Disp: , Rfl:    Cyanocobalamin (VITAMIN B-12 PO), Take 1 tablet by mouth daily., Disp: , Rfl:    ENTRESTO 97-103 MG, TAKE 1 TABLET BY MOUTH TWICE A DAY, Disp: 180 tablet, Rfl: 3   glucose blood (PRODIGY NO CODING BLOOD GLUC) test strip, Use as instructed to check blood sugar twice a day.  DX  E11.40, Disp: 100 each, Rfl: 1   hydrALAZINE (APRESOLINE) 10 MG tablet, Take 1 tablet (10 mg total) by mouth 3 (three) times daily., Disp: 270 tablet, Rfl: 1   insulin aspart (NOVOLOG FLEXPEN) 100 UNIT/ML  FlexPen, Inject 6 Units into the skin with breakfast, with lunch, and with evening meal., Disp: 15 mL, Rfl: 4   insulin detemir (LEVEMIR FLEXTOUCH) 100 UNIT/ML FlexPen, Inject 18 Units into the skin every morning., Disp: 15 mL, Rfl: 4   Insulin Pen Needle (BD PEN NEEDLE NANO 2ND GEN) 32G X 4 MM MISC, Inject 1 Device into the skin in the morning, at noon, in the evening, and at bedtime., Disp: 400 each, Rfl: 3   isosorbide mononitrate (IMDUR) 30 MG 24 hr tablet, Take 15 mg by mouth daily., Disp: , Rfl:    metFORMIN (GLUCOPHAGE) 1000 MG tablet, Take 1 tablet (1,000 mg total) by mouth 2 (two) times daily with a meal., Disp: 180 tablet, Rfl: 3   metoprolol succinate (TOPROL-XL) 50 MG 24 hr tablet, Take 50 mg by mouth daily., Disp: , Rfl:    rosuvastatin (CRESTOR) 20 MG tablet, Take 1 tablet (20 mg total) by mouth daily., Disp: 90 tablet, Rfl: 1   spironolactone (ALDACTONE) 25 MG tablet, TAKE 1/2 TABLET BY MOUTH EVERY DAY, Disp: 45 tablet, Rfl: 3  Allergies:  Allergies  Allergen Reactions   Lisinopril Other (See Comments)    Hyperkalemia    Hydrochlorothiazide Hives   Amlodipine Besylate Other (See Comments)  REACTION: ? caused left axillary nodules/chest flutter   Farxiga [Dapagliflozin] Rash and Other (See Comments)    "swollen penis"    Past Medical History, Surgical history, Social history, and Family History were reviewed and updated.  Review of Systems: Review of Systems  Constitutional: Negative.   HENT:  Negative.    Eyes: Negative.   Respiratory: Negative.    Cardiovascular: Negative.   Gastrointestinal: Negative.   Endocrine: Negative.   Genitourinary: Negative.    Musculoskeletal: Negative.   Skin: Negative.   Neurological: Negative.   Hematological: Negative.   Psychiatric/Behavioral: Negative.     Physical Exam:  weight is 171 lb 12.8 oz (77.9 kg). His oral temperature is 98.7 F (37.1 C). His blood pressure is 144/86 (abnormal) and his pulse is 73. His respiration  is 20 and oxygen saturation is 96%.   Wt Readings from Last 3 Encounters:  03/28/21 171 lb 12.8 oz (77.9 kg)  02/13/21 174 lb 9.6 oz (79.2 kg)  12/26/20 178 lb (80.7 kg)    Physical Exam Vitals reviewed.  HENT:     Head: Normocephalic and atraumatic.  Eyes:     Pupils: Pupils are equal, round, and reactive to light.  Cardiovascular:     Rate and Rhythm: Normal rate and regular rhythm.     Heart sounds: Normal heart sounds.  Pulmonary:     Effort: Pulmonary effort is normal.     Breath sounds: Normal breath sounds.  Abdominal:     General: Bowel sounds are normal.     Palpations: Abdomen is soft.  Musculoskeletal:        General: No tenderness or deformity. Normal range of motion.     Cervical back: Normal range of motion.  Lymphadenopathy:     Cervical: No cervical adenopathy.  Skin:    General: Skin is warm and dry.     Findings: No erythema or rash.  Neurological:     Mental Status: He is alert and oriented to person, place, and time.  Psychiatric:        Behavior: Behavior normal.        Thought Content: Thought content normal.        Judgment: Judgment normal.   Lab Results  Component Value Date   WBC 7.7 03/28/2021   HGB 13.7 03/28/2021   HCT 45.8 03/28/2021   MCV 64.2 (L) 03/28/2021   PLT 440 (H) 03/28/2021     Chemistry      Component Value Date/Time   NA 141 03/28/2021 0947   NA 142 06/07/2020 1111   K 4.1 03/28/2021 0947   CL 103 03/28/2021 0947   CO2 28 03/28/2021 0947   BUN 15 03/28/2021 0947   BUN 10 06/07/2020 1111   CREATININE 0.98 03/28/2021 0947   CREATININE 0.84 11/30/2013 0856      Component Value Date/Time   CALCIUM 9.6 03/28/2021 0947   ALKPHOS 40 03/28/2021 0947   AST 14 (L) 03/28/2021 0947   ALT 8 03/28/2021 0947   BILITOT 0.5 03/28/2021 0947      Impression and Plan: [name] is a very nice 70 year old African-American male.  He has polycythemia vera.  This is by virtue of him having the positive JAK2 assay.  I had to  suspect that he is probably iron deficient because of the polycythemia.  He is on full dose aspirin because of his cardiac issues.  We will go ahead and phlebotomize him today.  By doing this, we can probably get him back  after the holidays.  I think this would be very reasonable and would make life easier for him.   Volanda Napoleon, MD 8/3/202211:17 AM

## 2021-03-28 NOTE — Patient Instructions (Signed)
Therapeutic Phlebotomy Therapeutic phlebotomy is the planned removal of blood from a person's body for the purpose of treating a medical condition. The procedure is similar to donating blood. Usually, about a pint (470 mL, or 0.47 L) of blood is removed.The average adult has 9-12 pints (4.3-5.7 L) of blood in the body. Therapeutic phlebotomy may be used to treat the following medical conditions: Hemochromatosis. This is a condition in which the blood contains too much iron. Polycythemia vera. This is a condition in which the blood contains too many red blood cells. Porphyria cutanea tarda. This is a disease in which an important part of hemoglobin is not made properly. It results in the buildup of abnormal amounts of porphyrins in the body. Sickle cell disease. This is a condition in which the red blood cells form an abnormal crescent shape rather than a round shape. Tell a health care provider about: Any allergies you have. All medicines you are taking, including vitamins, herbs, eye drops, creams, and over-the-counter medicines. Any problems you or family members have had with anesthetic medicines. Any blood disorders you have. Any surgeries you have had. Any medical conditions you have. Whether you are pregnant or may be pregnant. What are the risks? Generally, this is a safe procedure. However, problems may occur, including: Nausea or light-headedness. Low blood pressure (hypotension). Soreness, bleeding, swelling, or bruising at the needle insertion site. Infection. What happens before the procedure? Follow instructions from your health care provider about eating or drinking restrictions. Ask your health care provider about: Changing or stopping your regular medicines. This is especially important if you are taking diabetes medicines or blood thinners (anticoagulants). Taking medicines such as aspirin and ibuprofen. These medicines can thin your blood. Do not take these medicines unless  your health care provider tells you to take them. Taking over-the-counter medicines, vitamins, herbs, and supplements. Wear clothing with sleeves that can be raised above the elbow. Plan to have someone take you home from the hospital or clinic. You may have a blood sample taken. Your blood pressure, pulse rate, and breathing rate will be measured. What happens during the procedure?  To lower your risk of infection: Your health care team will wash or sanitize their hands. Your skin will be cleaned with an antiseptic. You may be given a medicine to numb the area (local anesthetic). A tourniquet will be placed on your arm. A needle will be inserted into one of your veins. Tubing and a collection bag will be attached to that needle. Blood will flow through the needle and tubing into the collection bag. The collection bag will be placed lower than your arm to allow gravity to help the flow of blood into the bag. You may be asked to open and close your hand slowly and continually during the entire collection. After the specified amount of blood has been removed from your body, the collection bag and tubing will be clamped. The needle will be removed from your vein. Pressure will be held on the site of the needle insertion to stop the bleeding. A bandage (dressing) will be placed over the needle insertion site. The procedure may vary among health care providers and hospitals. What happens after the procedure? Your blood pressure, pulse rate, and breathing rate will be measured after the procedure. You will be encouraged to drink fluids. Your recovery will be assessed and monitored. You can return to your normal activities as told by your health care provider. Summary Therapeutic phlebotomy is the planned removal of   blood from a person's body for the purpose of treating a medical condition. Therapeutic phlebotomy may be used to treat hemochromatosis, polycythemia vera, porphyria cutanea tarda,  or sickle cell disease. In the procedure, a needle is inserted and about a pint (470 mL, or 0.47 L) of blood is removed. The average adult has 9-12 pints (4.3-5.7 L) of blood in the body. This is generally a safe procedure, but it can sometimes cause problems such as nausea, light-headedness, or low blood pressure (hypotension). This information is not intended to replace advice given to you by your health care provider. Make sure you discuss any questions you have with your healthcare provider. Document Revised: 08/28/2017 Document Reviewed: 08/28/2017 [employer] Patient Education  2022 [employer]  

## 2021-03-28 NOTE — Telephone Encounter (Signed)
Appts made per 03/28/21 los, pt to gain sch at [facility] and through [employer]

## 2021-03-29 LAB — IRON AND TIBC
Iron: 29 ug/dL — ABNORMAL LOW (ref 42–163)
Saturation Ratios: 7 % — ABNORMAL LOW (ref 20–55)
TIBC: 398 ug/dL (ref 202–409)
UIBC: 368 ug/dL (ref 117–376)

## 2021-03-29 LAB — FERRITIN: Ferritin: <4 ng/mL — ABNORMAL LOW (ref 24–336)

## 2021-03-30 ENCOUNTER — Ambulatory Visit (INDEPENDENT_AMBULATORY_CARE_PROVIDER_SITE_OTHER): Payer: Medicare Other

## 2021-03-30 VITALS — Ht 71.0 in | Wt 171.0 lb

## 2021-03-30 DIAGNOSIS — Z0000 Encounter for general adult medical examination without abnormal findings: Secondary | ICD-10-CM | POA: Diagnosis not present

## 2021-03-30 NOTE — Progress Notes (Addendum)
Subjective:   [name] is a 70 y.o. male who presents for Medicare Annual/Subsequent preventive examination.  I connected with [name] today by telephone and verified that I am speaking with the correct person using two identifiers. Location patient: home Location provider: work Persons participating in the virtual visit: patient, Marine scientist.    I discussed the limitations, risks, security and privacy concerns of performing an evaluation and management service by telephone and the availability of in person appointments. I also discussed with the patient that there may be a patient responsible charge related to this service. The patient expressed understanding and verbally consented to this telephonic visit.    Interactive audio and video telecommunications were attempted between this provider and patient, however failed, due to patient having technical difficulties OR patient did not have access to video capability.  We continued and completed visit with audio only.  Some vital signs may be absent or patient reported.   Time Spent with patient on telephone encounter: 20 minutes   Review of Systems     Cardiac Risk Factors include: advanced age (>75mn, >>25women);diabetes mellitus;dyslipidemia;hypertension;male gender     Objective:    Today's Vitals   03/30/21 0822  Weight: 171 lb (77.6 kg)  Height: '5\' 11"'$  (1.803 m)   Body mass index is 23.85 kg/m.  Advanced Directives 03/30/2021 03/28/2021 12/26/2020 09/28/2020 08/24/2020 07/11/2020 05/10/2020  Does Patient Have a Medical Advance Directive? No No No No Yes No Yes  Type of Advance Directive - Living will;Healthcare Power of Attorney Living will;[place]care [place]  Does patient want to make changes to medical advance directive? - - No - Patient declined - No - Patient declined - No - Patient declined  Copy of HCosmos in Chart? - - - - No - copy requested - No - copy requested  Would patient like information on creating a medical advance directive? Yes (MAU/Ambulatory/Procedural Areas - Information given) - - - No - Patient declined No - Patient declined -    Current Medications (verified) Outpatient Encounter Medications as of 03/30/2021  Medication Sig   acetaminophen (TYLENOL) 500 MG tablet Take 1,000 mg by mouth every 6 (six) hours as needed for moderate pain or headache.   aspirin EC 325 MG EC tablet Take 1 tablet (325 mg total) by mouth daily.   cetirizine (ZYRTEC) 10 MG tablet Take 10 mg by mouth daily as needed for allergies.   Cyanocobalamin (VITAMIN B-12 PO) Take 1 tablet by mouth daily.   ENTRESTO 97-103 MG TAKE 1 TABLET BY MOUTH TWICE A DAY   glucose blood (PRODIGY NO CODING BLOOD GLUC) test strip Use as instructed to check blood sugar twice a day.  DX  E11.40   hydrALAZINE (APRESOLINE) 10 MG tablet Take 1 tablet (10 mg total) by mouth 3 (three) times daily.   insulin aspart (NOVOLOG FLEXPEN) 100 UNIT/ML FlexPen Inject 6 Units into the skin with breakfast, with lunch, and with evening meal.   insulin detemir (LEVEMIR FLEXTOUCH) 100 UNIT/ML FlexPen Inject 18 Units into the skin every morning.   Insulin Pen Needle (BD PEN NEEDLE NANO 2ND GEN) 32G X 4 MM MISC Inject 1 Device into the skin in the morning, at noon, in the evening, and at bedtime.   isosorbide mononitrate (IMDUR) 30 MG 24 hr tablet Take 15 mg by mouth daily.   metFORMIN (GLUCOPHAGE) 1000 MG tablet Take 1 tablet (1,000  mg total) by mouth 2 (two) times daily with a meal.   metoprolol succinate (TOPROL-XL) 50 MG 24 hr tablet Take 50 mg by mouth daily.   rosuvastatin (CRESTOR) 20 MG tablet Take 1 tablet (20 mg total) by mouth daily.   spironolactone (ALDACTONE) 25 MG tablet TAKE 1/2 TABLET BY MOUTH EVERY DAY   No facility-administered encounter medications on file as of 03/30/2021.    Allergies (verified) Lisinopril, Hydrochlorothiazide,  Amlodipine besylate, and Farxiga [dapagliflozin]   History: Past Medical History:  Diagnosis Date   Abnormal myocardial perfusion study 2006   EF 44% ? inferoseptal ischemia. no cath done   Benign neoplasm of cecum    Breast mass, right 03/2008   Cardiomyopathy ([place]) 07/20/2018   Ejection fraction 4045% in September 2019   Centrilobular emphysema ([place]) 08/25/2018   02/04/2018-high-res CT- no evidence of interstitial lung disease, severe centrilobular emphysema and mild diffuse bronchial wall thickening suggesting COPD, no calcified pleural plaques no pleural effusion, mild smooth bilateral pleural thickening which is nonspecific  02/04/2018-pulmonary function test- FVC 3.37 (71% predicted), postbronchodilator ratio 50, postbronchodilator FEV1 54, mid flow reve   CHF (congestive heart failure) ([place])    COLONIC POLYPS, HX OF 03/18/2007   Qualifier: Diagnosis of  By: [name]    Contact dermatitis 01/31/2013   COPD (chronic obstructive pulmonary disease) ([place])    "dx'd 12/2017"   COPD GOLD II A  08/25/2018   02/04/2018-high-res CT- no evidence of interstitial lung disease, severe centrilobular emphysema and mild diffuse bronchial wall thickening suggesting COPD, no calcified pleural plaques no pleural effusion, mild smooth bilateral pleural thickening which is nonspecific  02/04/2018-pulmonary function test- FVC 3.37 (71% predicted), postbronchodilator ratio 50, postbronchodilator FEV1 54, mid flow reve   Coronary artery disease    Critical aortic valve stenosis 01/12/2018   Calculated valve area less than 0.5 cm mean gradient more than 60 mm of mercury   DEGENERATIVE JOINT DISEASE, BACK 03/18/2007   Qualifier: Diagnosis of  By: [name]    Depressed left ventricular ejection fraction 03/28/2020   Diabetes mellitus ([place]) 07/25/2020   Dyslipidemia 04/20/2020   Dyspnea on exertion 01/12/2018   ERECTILE DYSFUNCTION, ORGANIC 02/10/2009   Qualifier: Diagnosis of  By: [name]     Former cigarette smoker 10/12/2018   Quit in 2012 41-pack-year smoking history   Goals of care, counseling/discussion 08/24/2020   H/O aortic valve replacement 05/05/2020   Heart murmur    HEMORRHOIDS, INTERNAL 03/18/2007   Qualifier: Diagnosis of  By: [name]    HTN (hypertension) 10/21/2007        Hyperlipemia 10/21/2007   Qualifier: Diagnosis of  By: [name]    Hypertension    Hypoglycemia due to insulin 03/26/2020   Low back pain    MYOCARDIAL PERFUSION SCAN, WITH STRESS TEST, ABNORMAL 08/11/2008   Qualifier: Diagnosis of  By: Johnsie Cancel, MD, Rona Ravens    Nocturnal hypoxemia 08/25/2018   04/09/18 >>> ONO - telephone note>>> Patient's ONO on room air showed that he was less than 88% for more than an hour. Patient needs to be on 2 L at HS.  10/06/2018-overnight oximetry-entire duration was 4 hours and 23 minutes, SPO2 less than 88% for 25 minutes and 32 seconds     Oxygen deficiency    uses 2L at night   Paroxysmal atrial fibrillation ([place]) 10/12/2018   PCP NOTES >>>>>>>>>>>>>>>>> 05/14/2020   Polycythemia vera ([place]) 08/04/2020   S/P  AVR 03/04/2018   AVR 23 mm Edwards Magna Valve - bioprostetic 03/04/2018 LIMA - LAD   Status post coronary artery bypass graft 03/20/2018   LIMA to LAD during aortic valve replacement surgery 03/04/2018   Type 2 diabetes mellitus with hyperglycemia, with long-term current use of insulin ([place]) 04/20/2020   Type 2 diabetes mellitus, uncontrolled, with neuropathy ([place]) 05/20/2008   Qualifier: Diagnosis of  By: [name]    Past Surgical History:  Procedure Laterality Date   ABDOMINAL AORTOGRAM N/A 01/13/2018   Procedure: ABDOMINAL AORTOGRAM;  Surgeon: Troy Sine, MD;  Location: [facility];  Service: Cardiovascular;  Laterality: N/A;   AORTIC VALVE REPLACEMENT N/A 03/04/2018   Procedure: AORTIC VALVE REPLACEMENT (AVR) 9m Edwards Magna Ease Tissue Valve.;  Surgeon: HMelrose Nakayama MD;  [facility]  Service: Open  Heart Surgery;  Laterality: N/A;   CARDIAC VALVE REPLACEMENT     COLONOSCOPY W/ POLYPECTOMY     COLONOSCOPY WITH PROPOFOL N/A 02/22/2020   Procedure: COLONOSCOPY WITH PROPOFOL;  Surgeon: DDoran Stabler MD;  [facility];  Service: Gastroenterology;  Laterality: N/A;   CORONARY ARTERY BYPASS GRAFT N/A 03/04/2018   Procedure: CORONARY ARTERY BYPASS GRAFTING (CABG) x1:  LIMA to LAD.;  Surgeon: HMelrose Nakayama MD;  [place] [facility]  Service: Open Heart Surgery;  Laterality: N/A;   HEMOSTASIS CLIP PLACEMENT  02/22/2020   Procedure: HEMOSTASIS CLIP PLACEMENT;  Surgeon: DDoran Stabler MD;  [facility];  Service: Gastroenterology;;   INTRAVASCULAR PRESSURE WIRE/FFR STUDY N/A 05/10/2020   Procedure: INTRAVASCULAR PRESSURE WIRE/FFR STUDY;  Surgeon: CSherren Mocha MD;  [facility];  Service: Cardiovascular;  Laterality: N/A;   LEFT HEART CATH AND CORS/GRAFTS ANGIOGRAPHY N/A 05/10/2020   Procedure: LEFT HEART CATH AND CORS/GRAFTS ANGIOGRAPHY;  Surgeon: CSherren Mocha MD;  [facility];  Service: Cardiovascular;  Laterality: N/A;   LIPOMA EXCISION Left 07/2008    "back of shoulder" Dr TGershon Crane  POLYPECTOMY  02/22/2020   Procedure: POLYPECTOMY;  Surgeon: DDoran Stabler MD;  [facility];  Service: Gastroenterology;;   RIGHT/LEFT HEART CATH AND CORONARY ANGIOGRAPHY N/A 01/13/2018   Procedure: RIGHT/LEFT HEART CATH AND CORONARY ANGIOGRAPHY;  Surgeon: KTroy Sine MD;  [facility];  Service: Cardiovascular;  Laterality: N/A;   TEE WITHOUT CARDIOVERSION N/A 03/04/2018   Procedure: TRANSESOPHAGEAL ECHOCARDIOGRAM (TEE);  Surgeon: HMelrose Nakayama MD;  [facility]  Service: Open Heart Surgery;  Laterality: N/A;   TONSILLECTOMY     WISDOM TOOTH EXTRACTION     Family History  Problem Relation Age of Onset   Melanoma Father 717      deceased secondary to melanoma   Alzheimer's disease Father     Diabetes Father    Hypertension Mother        alive -897  Colon cancer Neg Hx    Esophageal cancer Neg Hx    Colon polyps Neg Hx    Stomach cancer Neg Hx    Social History   Socioeconomic History   Marital status: Married    Spouse name: Not on file   Number of children: Not on file   Years of education: Not on file   Highest education level: Not on file  Occupational History   Not on file  Tobacco Use   Smoking status: Former    Packs/day: 1.50    Years: 41.00    Pack  years: 61.50    Types: Cigarettes    Quit date: 08/26/2010    Years since quitting: 10.6   Smokeless tobacco: Never   Tobacco comments:    Quit 2012  Vaping Use   Vaping Use: Never used  Substance and Sexual Activity   Alcohol use: Yes    Comment: occasionally   Drug use: Not Currently   Sexual activity: Not Currently  Other Topics Concern   Not on file  Social History Narrative   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   Married    Former Smoker -  33 pack year history   Alcohol use-no     Drug use-no             Social Determinants of Radio broadcast assistant Strain: Low Risk    Difficulty of Paying Living Expenses: Not hard at all  Food Insecurity: No Food Insecurity   Worried About Charity fundraiser in the Last Year: Never true   Arboriculturist in the Last Year: Never true  Transportation Needs: No Transportation Needs   Lack of Transportation (Medical): No   Lack of Transportation (Non-Medical): No  Physical Activity: Insufficiently Active   Days of Exercise per Week: 2 days   Minutes of Exercise per Session: 60 min  Stress: No Stress Concern Present   Feeling of Stress : Not at all  Social Connections: Moderately Integrated   Frequency of Communication with Friends and Family: More than three times a week   Frequency of Social Gatherings with Friends and Family: More than three times a week   Attends Religious Services: More than 4 times per year   Active Member of [employer] or  Organizations: No   Attends Archivist Meetings: Never   Marital Status: Married    Tobacco Counseling Counseling given: Not Answered Tobacco comments: Quit 2012   Clinical Intake:  Pre-visit preparation completed: Yes  Pain : No/denies pain     Nutritional Status: BMI of 19-24  Normal Nutritional Risks: None Diabetes: Yes CBG done?: No Did pt. bring in CBG monitor from home?: No (phone visit)  How often do you need to have someone help you when you read instructions, pamphlets, or other written materials from your doctor or pharmacy?: 1 - Never  Diabetes:  Is the patient diabetic?  Yes  If diabetic, was a CBG obtained today?  No  Did the patient bring in their glucometer from home?  No phone visit How often do you monitor your CBG's? Twice daily.   Financial Strains and Diabetes Management:  Are you having any financial strains with the device, your supplies or your medication? No .  Does the patient want to be seen by Chronic Care Management for management of their diabetes?  No  Would the patient like to be referred to a Nutritionist or for Diabetic Management?  No   Diabetic Exams:  Diabetic Eye Exam: Completed 09/14/2020.   Diabetic Foot Exam: Pt has been advised about the importance in completing this exam.To be completed by PCP.    Interpreter Needed?: No  Information entered by :: Caroleen Hamman LPN   Activities of Daily Living In your present state of health, do you have any difficulty performing the following activities: 03/30/2021  Hearing? N  Vision? N  Difficulty concentrating or making decisions? N  Walking or climbing stairs? N  Dressing or bathing? N  Doing errands, shopping? N  Preparing Food and eating ? N  Using the  Toilet? N  In the past six months, have you accidently leaked urine? N  Do you have problems with loss of bowel control? N  Managing your Medications? N  Managing your Finances? N  Housekeeping or managing your  Housekeeping? N  Some recent data might be hidden    Patient Care Team: Colon Branch, MD as PCP - General (Internal Medicine) Berniece Salines, DO as PCP - Cardiology (Cardiology) Philemon Kingdom, MD as Consulting Physician (Internal Medicine) Spero Geralds, MD as Consulting Physician (Pulmonary Disease) Colon Branch, MD as Consulting Physician (Internal Medicine)  Indicate any recent Medical Services you may have received from other than [facility] providers in the past year (date may be approximate).     Assessment:   This is a routine wellness examination for [place].  Hearing/Vision screen Hearing Screening - Comments:: C/O ringing in ears Vision Screening - Comments:: Reading glasses Last eye exam-08/2020-Dr. Delman Cheadle  Dietary issues and exercise activities discussed: Current Exercise Habits: Home exercise routine, Type of exercise: walking, Time (Minutes): 60, Frequency (Times/Week): 2, Weekly Exercise (Minutes/Week): 120, Intensity: Mild   Goals Addressed             This Visit's Progress    Increase physical activity   Not on track    Do bike 15 min/ 2 days per week.     Patient Stated       Drink more water & eat healthier       Depression Screen PHQ 2/9 Scores 03/30/2021 09/13/2020 03/23/2020 03/22/2019 08/05/2018 05/06/2018 03/12/2018  PHQ - 2 Score 0 0 0 0 0 0 0  PHQ- 9 Score - - - - - - 1    Fall Risk Fall Risk  03/30/2021 09/13/2020 03/23/2020 03/22/2019 03/12/2018  Falls in the past year? 0 0 0 0 No  Number falls in past yr: 0 0 0 - -  Injury with Fall? 0 0 0 - -  Follow up Falls prevention discussed - Education provided;Falls prevention discussed - -    FALL RISK PREVENTION PERTAINING TO THE HOME:  Any stairs in or around the home? No  Home free of loose throw rugs in walkways, pet beds, electrical cords, etc? Yes  Adequate lighting in your home to reduce risk of falls? Yes   ASSISTIVE DEVICES UTILIZED TO PREVENT FALLS:  Life alert? No  Use of a cane, walker or  w/c? No  Grab bars in the bathroom? Yes  Shower chair or bench in shower? No  Elevated toilet seat or a handicapped toilet? No   TIMED UP AND GO:  Was the test performed? No . Phone visit   Cognitive Function:Normal cognitive status assessed by his Nurse Health Advisor. No abnormalities found.          Immunizations Immunization History  Administered Date(s) Administered   Fluad Quad(high Dose 65+) 05/26/2019, 05/12/2020   Influenza Split 08/04/2012   Influenza Whole 05/20/2008, 07/12/2009, 05/02/2010   Influenza, High Dose Seasonal PF 05/20/2017, 06/10/2018   Influenza,inj,Quad PF,6+ Mos 05/07/2013, 05/09/2014, 06/07/2015, 05/01/2016   PFIZER(Purple Top)SARS-COV-2 Vaccination 11/16/2019, 12/14/2019, 06/29/2020   Pneumococcal Conjugate-13 02/05/2017   Pneumococcal Polysaccharide-23 05/20/2008, 08/25/2018   Td 08/26/2008    TDAP status: Due, Education has been provided regarding the importance of this vaccine. Advised may receive this vaccine at local pharmacy or Health Dept. Aware to provide a copy of the vaccination record if obtained from local pharmacy or Health Dept. Verbalized acceptance and understanding.  Flu Vaccine status: Up to date  Pneumococcal vaccine status: Up to date  Covid-19 vaccine status: Information provided on how to obtain vaccines. Booster due  Qualifies for Shingles Vaccine? Yes   Zostavax completed No   Shingrix Completed?: No.    Education has been provided regarding the importance of this vaccine. Patient has been advised to call insurance company to determine out of pocket expense if they have not yet received this vaccine. Advised may also receive vaccine at local pharmacy or Health Dept. Verbalized acceptance and understanding.  Screening Tests Health Maintenance  Topic Date Due   Zoster Vaccines- Shingrix (1 of 2) Never done   TETANUS/TDAP  10/10/2018   COVID-19 Vaccine (4 - Booster for Pfizer series) 09/29/2020   FOOT EXAM  09/30/2020    INFLUENZA VACCINE  03/26/2021   HEMOGLOBIN A1C  08/15/2021   OPHTHALMOLOGY EXAM  09/14/2021   COLONOSCOPY (Pts 45-50yr Insurance coverage will need to be confirmed)  03/04/2023   Hepatitis C Screening  Completed   PNA vac Low Risk Adult  Completed   HPV VACCINES  Aged Out    Health Maintenance  Health Maintenance Due  Topic Date Due   Zoster Vaccines- Shingrix (1 of 2) Never done   TETANUS/TDAP  10/10/2018   COVID-19 Vaccine (4 - Booster for Pfizer series) 09/29/2020   FOOT EXAM  09/30/2020   INFLUENZA VACCINE  03/26/2021    Colorectal cancer screening: Type of screening: Colonoscopy. Completed 03/13/2020. Repeat every 3 years  Lung Cancer Screening: (Low Dose CT Chest recommended if Age 70-80years, 30 pack-year currently smoking OR have quit w/in 15years.) does not qualify.   Lung Cancer Screening :  Completed 06/07/2020  Additional Screening:  Hepatitis C Screening:  Completed 09/08/2018  Vision Screening: Recommended annual ophthalmology exams for early detection of glaucoma and other disorders of the eye. Is the patient up to date with their annual eye exam?  Yes  Who is the provider or what is the name of the office in which the patient attends annual eye exams? Dr. Could   Dental Screening: Recommended annual dental exams for proper oral hygiene  Community Resource Referral / Chronic Care Management: CRR required this visit?  No   CCM required this visit?  No      Plan:     I have personally reviewed and noted the following in the patient's chart:   Medical and social history Use of alcohol, tobacco or illicit drugs  Current medications and supplements including opioid prescriptions. Patient is not currently taking opioid prescriptions. Functional ability and status Nutritional status Physical activity Advanced directives List of other physicians Hospitalizations, surgeries, and ER visits in previous 12 months Vitals Screenings to include cognitive,  depression, and falls Referrals and appointments  In addition, I have reviewed and discussed with patient certain preventive protocols, quality metrics, and best practice recommendations. A written personalized care plan for preventive services as well as general preventive health recommendations were provided to patient.   Due to this being a telephonic visit, the after visit summary with patients personalized plan was offered to patient via mail or my-chart. Patient would like to access on my-chart.   MMarta Antu LPN   [id number] Nurse Health Advisor  Nurse Notes: None  I have reviewed and agree with Health Coaches documentation.  JKathlene November MD

## 2021-03-30 NOTE — Patient Instructions (Signed)
[name] , Thank you for taking time to complete your Medicare Wellness Visit. I appreciate your ongoing commitment to your health goals. Please review the following plan we discussed and let me know if I can assist you in the future.   Screening recommendations/referrals: Colonoscopy: Completed 03/13/2020-Due 03/14/2023 Recommended yearly ophthalmology/optometry visit for glaucoma screening and checkup Recommended yearly dental visit for hygiene and checkup  Vaccinations: Influenza vaccine: Up to date Pneumococcal vaccine: Up to date Tdap vaccine: Discuss with pharmacy Shingles vaccine: Discuss with pharmacy   Covid-19: Booster due  Advanced directives: Information mailed  Conditions/risks identified: See problem list  Next appointment: Follow up in one year for your annual wellness visit. 04/04/2022 @ 8:20  Preventive Care 70 Years and Older, Male Preventive care refers to lifestyle choices and visits with your health care provider that can promote health and wellness. What does preventive care include? A yearly physical exam. This is also called an annual well check. Dental exams once or twice a year. Routine eye exams. Ask your health care provider how often you should have your eyes checked. Personal lifestyle choices, including: Daily care of your teeth and gums. Regular physical activity. Eating a healthy diet. Avoiding tobacco and drug use. Limiting alcohol use. Practicing safe sex. Taking low doses of aspirin every day. Taking vitamin and mineral supplements as recommended by your health care provider. What happens during an annual well check? The services and screenings done by your health care provider during your annual well check will depend on your age, overall health, lifestyle risk factors, and family history of disease. Counseling  Your health care provider may ask you questions about your: Alcohol use. Tobacco use. Drug use. Emotional well-being. Home  and relationship well-being. Sexual activity. Eating habits. History of falls. Memory and ability to understand (cognition). Work and work Statistician. Screening  You may have the following tests or measurements: Height, weight, and BMI. Blood pressure. Lipid and cholesterol levels. These may be checked every 5 years, or more frequently if you are over 17 years old. Skin check. Lung cancer screening. You may have this screening every year starting at age 70 if you have a 30-pack-year history of smoking and currently smoke or have quit within the past 15 years. Fecal occult blood test (FOBT) of the stool. You may have this test every year starting at age 70. Flexible sigmoidoscopy or colonoscopy. You may have a sigmoidoscopy every 5 years or a colonoscopy every 10 years starting at age 70. Prostate cancer screening. Recommendations will vary depending on your family history and other risks. Hepatitis C blood test. Hepatitis B blood test. Sexually transmitted disease (STD) testing. Diabetes screening. This is done by checking your blood sugar (glucose) after you have not eaten for a while (fasting). You may have this done every 1-3 years. Abdominal aortic aneurysm (AAA) screening. You may need this if you are a current or former smoker. Osteoporosis. You may be screened starting at age 70 if you are at high risk. Talk with your health care provider about your test results, treatment options, and if necessary, the need for more tests. Vaccines  Your health care provider may recommend certain vaccines, such as: Influenza vaccine. This is recommended every year. Tetanus, diphtheria, and acellular pertussis (Tdap, Td) vaccine. You may need a Td booster every 10 years. Zoster vaccine. You may need this after age 55. Pneumococcal 13-valent conjugate (PCV13) vaccine. One dose is recommended after age 1. Pneumococcal polysaccharide (PPSV23) vaccine. One dose is recommended  after age 35. Talk to  your health care provider about which screenings and vaccines you need and how often you need them. This information is not intended to replace advice given to you by your health care provider. Make sure you discuss any questions you have with your health care provider. Document Released: 09/08/2015 Document Revised: 05/01/2016 Document Reviewed: 06/13/2015 Elsevier Interactive Patient Education  2017 [employer] Prevention in the Home Falls can cause injuries. They can happen to people of all ages. There are many things you can do to make your home safe and to help prevent falls. What can I do on the outside of my home? Regularly fix the edges of walkways and driveways and fix any cracks. Remove anything that might make you trip as you walk through a door, such as a raised step or threshold. Trim any bushes or trees on the path to your home. Use bright outdoor lighting. Clear any walking paths of anything that might make someone trip, such as rocks or tools. Regularly check to see if handrails are loose or broken. Make sure that both sides of any steps have handrails. Any raised decks and porches should have guardrails on the edges. Have any leaves, snow, or ice cleared regularly. Use sand or salt on walking paths during winter. Clean up any spills in your garage right away. This includes oil or grease spills. What can I do in the bathroom? Use night lights. Install grab bars by the toilet and in the tub and shower. Do not use towel bars as grab bars. Use non-skid mats or decals in the tub or shower. If you need to sit down in the shower, use a plastic, non-slip stool. Keep the floor dry. Clean up any water that spills on the floor as soon as it happens. Remove soap buildup in the tub or shower regularly. Attach bath mats securely with double-sided non-slip rug tape. Do not have throw rugs and other things on the floor that can make you trip. What can I do in the bedroom? Use  night lights. Make sure that you have a light by your bed that is easy to reach. Do not use any sheets or blankets that are too big for your bed. They should not hang down onto the floor. Have a firm chair that has side arms. You can use this for support while you get dressed. Do not have throw rugs and other things on the floor that can make you trip. What can I do in the kitchen? Clean up any spills right away. Avoid walking on wet floors. Keep items that you use a lot in easy-to-reach places. If you need to reach something above you, use a strong step stool that has a grab bar. Keep electrical cords out of the way. Do not use floor polish or wax that makes floors slippery. If you must use wax, use non-skid floor wax. Do not have throw rugs and other things on the floor that can make you trip. What can I do with my stairs? Do not leave any items on the stairs. Make sure that there are handrails on both sides of the stairs and use them. Fix handrails that are broken or loose. Make sure that handrails are as long as the stairways. Check any carpeting to make sure that it is firmly attached to the stairs. Fix any carpet that is loose or worn. Avoid having throw rugs at the top or bottom of the stairs. If you  do have throw rugs, attach them to the floor with carpet tape. Make sure that you have a light switch at the top of the stairs and the bottom of the stairs. If you do not have them, ask someone to add them for you. What else can I do to help prevent falls? Wear shoes that: Do not have high heels. Have rubber bottoms. Are comfortable and fit you well. Are closed at the toe. Do not wear sandals. If you use a stepladder: Make sure that it is fully opened. Do not climb a closed stepladder. Make sure that both sides of the stepladder are locked into place. Ask someone to hold it for you, if possible. Clearly mark and make sure that you can see: Any grab bars or handrails. First and last  steps. Where the edge of each step is. Use tools that help you move around (mobility aids) if they are needed. These include: Canes. Walkers. Scooters. Crutches. Turn on the lights when you go into a dark area. Replace any light bulbs as soon as they burn out. Set up your furniture so you have a clear path. Avoid moving your furniture around. If any of your floors are uneven, fix them. If there are any pets around you, be aware of where they are. Review your medicines with your doctor. Some medicines can make you feel dizzy. This can increase your chance of falling. Ask your doctor what other things that you can do to help prevent falls. This information is not intended to replace advice given to you by your health care provider. Make sure you discuss any questions you have with your health care provider. Document Released: 06/08/2009 Document Revised: 01/18/2016 Document Reviewed: 09/16/2014 Elsevier Interactive Patient Education  2017 [employer].

## 2021-03-31 ENCOUNTER — Other Ambulatory Visit: Payer: Self-pay | Admitting: Internal Medicine

## 2021-05-23 ENCOUNTER — Ambulatory Visit: Payer: Medicare Other | Admitting: Internal Medicine

## 2021-05-23 ENCOUNTER — Ambulatory Visit: Payer: Medicare Other | Admitting: Cardiology

## 2021-05-30 ENCOUNTER — Ambulatory Visit (INDEPENDENT_AMBULATORY_CARE_PROVIDER_SITE_OTHER): Payer: Medicare Other | Admitting: Cardiology

## 2021-05-30 ENCOUNTER — Other Ambulatory Visit: Payer: Self-pay

## 2021-05-30 ENCOUNTER — Encounter: Payer: Self-pay | Admitting: Cardiology

## 2021-05-30 VITALS — BP 150/86 | HR 68 | Ht 76.0 in | Wt 174.4 lb

## 2021-05-30 DIAGNOSIS — Z794 Long term (current) use of insulin: Secondary | ICD-10-CM | POA: Diagnosis not present

## 2021-05-30 DIAGNOSIS — E1165 Type 2 diabetes mellitus with hyperglycemia: Secondary | ICD-10-CM

## 2021-05-30 DIAGNOSIS — I480 Paroxysmal atrial fibrillation: Secondary | ICD-10-CM

## 2021-05-30 DIAGNOSIS — Z952 Presence of prosthetic heart valve: Secondary | ICD-10-CM

## 2021-05-30 DIAGNOSIS — I10 Essential (primary) hypertension: Secondary | ICD-10-CM

## 2021-05-30 DIAGNOSIS — I428 Other cardiomyopathies: Secondary | ICD-10-CM

## 2021-05-30 DIAGNOSIS — R0989 Other specified symptoms and signs involving the circulatory and respiratory systems: Secondary | ICD-10-CM | POA: Diagnosis not present

## 2021-05-30 DIAGNOSIS — I2510 Atherosclerotic heart disease of native coronary artery without angina pectoris: Secondary | ICD-10-CM

## 2021-05-30 LAB — BASIC METABOLIC PANEL
BUN/Creatinine Ratio: 10 (ref 10–24)
BUN: 10 mg/dL (ref 8–27)
CO2: 26 mmol/L (ref 20–29)
Calcium: 9.9 mg/dL (ref 8.6–10.2)
Chloride: 101 mmol/L (ref 96–106)
Creatinine, Ser: 0.96 mg/dL (ref 0.76–1.27)
Glucose: 86 mg/dL (ref 70–99)
Potassium: 5.3 mmol/L — ABNORMAL HIGH (ref 3.5–5.2)
Sodium: 140 mmol/L (ref 134–144)
eGFR: 86 mL/min/{1.73_m2} (ref >59)

## 2021-05-30 LAB — MAGNESIUM: Magnesium: 2 mg/dL (ref 1.6–2.3)

## 2021-05-30 MED ORDER — HYDRALAZINE HCL 25 MG PO TABS: 25.0000 mg | ORAL_TABLET | Freq: Three times a day (TID) | ORAL | 3 refills | Status: DC

## 2021-05-30 NOTE — Patient Instructions (Addendum)
Medication Instructions:  Your physician has recommended you make the following change in your medication:  INCREASE: Hydralazine 25 mg three times daily *If you need a refill on your cardiac medications before your next appointment, please call your pharmacy*   Lab Work: Your physician recommends that you return for lab work in:  TODAY: BMET, [place] If you have labs (blood work) drawn today and your tests are completely normal, you will receive your results only by: [place] (if you have [place]) OR A paper copy in the mail If you have any lab test that is abnormal or we need to change your treatment, we will call you to review the results.   Testing/Procedures: None   Follow-Up: At [facility], you and your health needs are our priority.  As part of our continuing mission to provide you with exceptional heart care, we have created designated Provider Care Teams.  These Care Teams include your primary Cardiologist (physician) and Advanced Practice Providers (APPs -  Physician Assistants and Nurse Practitioners) who all work together to provide you with the care you need, when you need it.  We recommend signing up for the patient portal called "MyChart".  Sign up information is provided on this After Visit Summary.  MyChart is used to connect with patients for Virtual Visits (Telemedicine).  Patients are able to view lab/test results, encounter notes, upcoming appointments, etc.  Non-urgent messages can be sent to your provider as well.   To learn more about what you can do with MyChart, go to [email or web address].    Your next appointment:   6 month(s)  The format for your next appointment:   In Person  Provider:   Berniece Salines, DO [address], [place], [place] [address]    Other Instructions

## 2021-05-30 NOTE — Progress Notes (Signed)
Cardiology Office Note:    Date:  05/30/2021   ID:  [name], DOB 09-14-1950, MRN [id number]  PCP:  Colon Branch, MD  Cardiologist:  Berniece Salines, DO  Electrophysiologist:  None   Referring MD: Colon Branch, MD   " I am doing ok"   History of Present Illness:    [name] is a 70 y.o. male with a hx of coronary artery disease status post CABG in 2019, ischemic cardiomyopathy most recent EF on March 27, 2020 at which time his EF was reported to be 25 to 30%, Status post aortic valve replacement with a 23 Edwards valve also in July 2019, hypertension hyperlipidemia and diabetes.  Polycythemia vera and is undergoing phlebotomy.   I saw the patient on April 05, 2020 at that time we kept him on his losartan as well as his beta-blocker and added Aldactone.    When I saw the patient 1 May 05, 2020 he still with experiencing significant fatigue and shortness of breath.  Given his history of  CAD I recommended he undergo a left heart catheterization to make sure that worsening/progression of CAD was not causing symptoms.  He did undergo left heart catheterization there is no need for a stent placement.   I saw the patient on August 14, 2020 at that time we will continue his beta-blocker, increase his Entresto to 49-51 twice daily, continue Aldactone 12.5, Hydralazine 10 mg 3 times daily and Farxiga 10 mg daily.  We discussed his cardiac MRI which show EF of 25%.  I saw the patient in March 2020 we discussed his left heart catheterization results.  With a concern for we will continue his medical therapy and was pending his echocardiogram.  Since his visit he has seen the heart failure team and has had his repeat echocardiogram. His echo in 11/2020 showed an EF of 40-45%.   He is here for a follow up visit and offers no complaints at this time.   Past Medical History:  Diagnosis Date   Abnormal myocardial perfusion study 2006   EF 44% ? inferoseptal ischemia. no cath  done   Benign neoplasm of cecum    Breast mass, right 03/2008   Cardiomyopathy ([place]) 07/20/2018   Ejection fraction 4045% in September 2019   Centrilobular emphysema ([place]) 08/25/2018   02/04/2018-high-res CT- no evidence of interstitial lung disease, severe centrilobular emphysema and mild diffuse bronchial wall thickening suggesting COPD, no calcified pleural plaques no pleural effusion, mild smooth bilateral pleural thickening which is nonspecific  02/04/2018-pulmonary function test- FVC 3.37 (71% predicted), postbronchodilator ratio 50, postbronchodilator FEV1 54, mid flow reve   CHF (congestive heart failure) ([place])    COLONIC POLYPS, HX OF 03/18/2007   Qualifier: Diagnosis of  By: [name]    Contact dermatitis 01/31/2013   COPD (chronic obstructive pulmonary disease) ([place])    "dx'd 12/2017"   COPD GOLD II A  08/25/2018   02/04/2018-high-res CT- no evidence of interstitial lung disease, severe centrilobular emphysema and mild diffuse bronchial wall thickening suggesting COPD, no calcified pleural plaques no pleural effusion, mild smooth bilateral pleural thickening which is nonspecific  02/04/2018-pulmonary function test- FVC 3.37 (71% predicted), postbronchodilator ratio 50, postbronchodilator FEV1 54, mid flow reve   Coronary artery disease    Critical aortic valve stenosis 01/12/2018   Calculated valve area less than 0.5 cm mean gradient more than 60 mm of mercury   DEGENERATIVE JOINT DISEASE, BACK 03/18/2007   Qualifier: Diagnosis of  By: [name]    Depressed left ventricular ejection fraction 03/28/2020   Diabetes mellitus ([place]) 07/25/2020   Dyslipidemia 04/20/2020   Dyspnea on exertion 01/12/2018   ERECTILE DYSFUNCTION, ORGANIC 02/10/2009   Qualifier: Diagnosis of  By: [name]    Former cigarette smoker 10/12/2018   Quit in 2012 41-pack-year smoking history   Goals of care, counseling/discussion 08/24/2020   H/O aortic valve replacement 05/05/2020   Heart murmur     HEMORRHOIDS, INTERNAL 03/18/2007   Qualifier: Diagnosis of  By: [name]    HTN (hypertension) 10/21/2007        Hyperlipemia 10/21/2007   Qualifier: Diagnosis of  By: [name]    Hypertension    Hypoglycemia due to insulin 03/26/2020   Low back pain    MYOCARDIAL PERFUSION SCAN, WITH STRESS TEST, ABNORMAL 08/11/2008   Qualifier: Diagnosis of  By: Johnsie Cancel, MD, Rona Ravens    Nocturnal hypoxemia 08/25/2018   04/09/18 >>> ONO - telephone note>>> Patient's ONO on room air showed that he was less than 88% for more than an hour. Patient needs to be on 2 L at HS.  10/06/2018-overnight oximetry-entire duration was 4 hours and 23 minutes, SPO2 less than 88% for 25 minutes and 32 seconds     Oxygen deficiency    uses 2L at night   Paroxysmal atrial fibrillation ([place]) 10/12/2018   PCP NOTES >>>>>>>>>>>>>>>>> 05/14/2020   Polycythemia vera ([place]) 08/04/2020   S/P AVR 03/04/2018   AVR 23 mm Edwards Magna Valve - bioprostetic 03/04/2018 LIMA - LAD   Status post coronary artery bypass graft 03/20/2018   LIMA to LAD during aortic valve replacement surgery 03/04/2018   Type 2 diabetes mellitus with hyperglycemia, with long-term current use of insulin ([place]) 04/20/2020   Type 2 diabetes mellitus, uncontrolled, with neuropathy 05/20/2008   Qualifier: Diagnosis of  By: [name]     Past Surgical History:  Procedure Laterality Date   ABDOMINAL AORTOGRAM N/A 01/13/2018   Procedure: ABDOMINAL AORTOGRAM;  Surgeon: Troy Sine, MD;  Location: [facility];  Service: Cardiovascular;  Laterality: N/A;   AORTIC VALVE REPLACEMENT N/A 03/04/2018   Procedure: AORTIC VALVE REPLACEMENT (AVR) 19mm Edwards Magna Ease Tissue Valve.;  Surgeon: Melrose Nakayama, MD;  Location: [facility];  Service: Open Heart Surgery;  Laterality: N/A;   CARDIAC VALVE REPLACEMENT     COLONOSCOPY W/ POLYPECTOMY     COLONOSCOPY WITH PROPOFOL N/A 02/22/2020   Procedure: COLONOSCOPY WITH PROPOFOL;  Surgeon:  Doran Stabler, MD;  Location: [facility];  Service: Gastroenterology;  Laterality: N/A;   CORONARY ARTERY BYPASS GRAFT N/A 03/04/2018   Procedure: CORONARY ARTERY BYPASS GRAFTING (CABG) x1:  LIMA to LAD.;  Surgeon: Melrose Nakayama, MD;  Location: [facility];  Service: Open Heart Surgery;  Laterality: N/A;   HEMOSTASIS CLIP PLACEMENT  02/22/2020   Procedure: HEMOSTASIS CLIP PLACEMENT;  Surgeon: Doran Stabler, MD;  Location: [facility];  Service: Gastroenterology;;   INTRAVASCULAR PRESSURE WIRE/FFR STUDY N/A 05/10/2020   Procedure: INTRAVASCULAR PRESSURE WIRE/FFR STUDY;  Surgeon: Sherren Mocha, MD;  Location: [facility];  Service: Cardiovascular;  Laterality: N/A;   LEFT HEART CATH AND CORS/GRAFTS ANGIOGRAPHY N/A 05/10/2020   Procedure: LEFT HEART CATH AND CORS/GRAFTS ANGIOGRAPHY;  Surgeon: Sherren Mocha, MD;  Location: [facility];  Service: Cardiovascular;  Laterality: N/A;   LIPOMA EXCISION Left 07/2008    "back of  shoulder" Dr Gershon Crane   POLYPECTOMY  02/22/2020   Procedure: POLYPECTOMY;  Surgeon: Doran Stabler, MD;  Location: [facility];  Service: Gastroenterology;;   RIGHT/LEFT HEART CATH AND CORONARY ANGIOGRAPHY N/A 01/13/2018   Procedure: RIGHT/LEFT HEART CATH AND CORONARY ANGIOGRAPHY;  Surgeon: Troy Sine, MD;  Location: [facility];  Service: Cardiovascular;  Laterality: N/A;   TEE WITHOUT CARDIOVERSION N/A 03/04/2018   Procedure: TRANSESOPHAGEAL ECHOCARDIOGRAM (TEE);  Surgeon: Melrose Nakayama, MD;  Location: [facility];  Service: Open Heart Surgery;  Laterality: N/A;   TONSILLECTOMY     WISDOM TOOTH EXTRACTION      Current Medications: Current Meds  Medication Sig   acetaminophen (TYLENOL) 500 MG tablet Take 1,000 mg by mouth every 6 (six) hours as needed for moderate pain or headache.   aspirin EC 325 MG EC tablet Take 1 tablet (325 mg total) by mouth daily.   cetirizine (ZYRTEC) 10 MG tablet Take 10 mg by mouth daily as needed for  allergies.   Cyanocobalamin (VITAMIN B-12 PO) Take 1 tablet by mouth daily.   ENTRESTO 97-103 MG TAKE 1 TABLET BY MOUTH TWICE A DAY   glucose blood (PRODIGY NO CODING BLOOD GLUC) test strip Use as instructed to check blood sugar twice a day.  DX  E11.40   hydrALAZINE (APRESOLINE) 25 MG tablet Take 1 tablet (25 mg total) by mouth 3 (three) times daily.   insulin aspart (NOVOLOG FLEXPEN) 100 UNIT/ML FlexPen Inject 6 Units into the skin with breakfast, with lunch, and with evening meal.   insulin detemir (LEVEMIR FLEXTOUCH) 100 UNIT/ML FlexPen Inject 18 Units into the skin every morning.   Insulin Pen Needle (BD PEN NEEDLE NANO 2ND GEN) 32G X 4 MM MISC Inject 1 Device into the skin in the morning, at noon, in the evening, and at bedtime.   isosorbide mononitrate (IMDUR) 30 MG 24 hr tablet Take 15 mg by mouth daily.   metFORMIN (GLUCOPHAGE) 1000 MG tablet Take 1 tablet (1,000 mg total) by mouth 2 (two) times daily with a meal.   metoprolol succinate (TOPROL-XL) 50 MG 24 hr tablet Take 50 mg by mouth daily.   rosuvastatin (CRESTOR) 20 MG tablet TAKE 1 TABLET BY MOUTH EVERY DAY   spironolactone (ALDACTONE) 25 MG tablet TAKE 1/2 TABLET BY MOUTH EVERY DAY   [DISCONTINUED] hydrALAZINE (APRESOLINE) 10 MG tablet Take 1 tablet (10 mg total) by mouth 3 (three) times daily.     Allergies:   Lisinopril, Hydrochlorothiazide, Amlodipine besylate, and Farxiga [dapagliflozin]   Social History   Socioeconomic History   Marital status: Married    Spouse name: Not on file   Number of children: Not on file   Years of education: Not on file   Highest education level: Not on file  Occupational History   Not on file  Tobacco Use   Smoking status: Former    Packs/day: 1.50    Years: 41.00    Pack years: 61.50    Types: Cigarettes    Quit date: 08/26/2010    Years since quitting: 10.7   Smokeless tobacco: Never   Tobacco comments:    Quit 2012  Vaping Use   Vaping Use: Never used  Substance and Sexual  Activity   Alcohol use: Yes    Comment: occasionally   Drug use: Not Currently   Sexual activity: Not Currently  Other Topics Concern   Not on file  Social History Narrative   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   Married  Former Smoker -  33 pack year history   Alcohol use-no     Drug use-no             Social Determinants of Radio broadcast assistant Strain: Low Risk    Difficulty of Paying Living Expenses: Not hard at all  Food Insecurity: No Food Insecurity   Worried About Charity fundraiser in the Last Year: Never true   Arboriculturist in the Last Year: Never true  Transportation Needs: No Transportation Needs   Lack of Transportation (Medical): No   Lack of Transportation (Non-Medical): No  Physical Activity: Insufficiently Active   Days of Exercise per Week: 2 days   Minutes of Exercise per Session: 60 min  Stress: No Stress Concern Present   Feeling of Stress : Not at all  Social Connections: Moderately Integrated   Frequency of Communication with Friends and Family: More than three times a week   Frequency of Social Gatherings with Friends and Family: More than three times a week   Attends Religious Services: More than 4 times per year   Active Member of [employer] or Organizations: No   Attends Music therapist: Never   Marital Status: Married     Family History: The patient's family history includes Alzheimer's disease in his father; Diabetes in his father; Hypertension in his mother; Melanoma (age of onset: 64) in his father. There is no history of Colon cancer, Esophageal cancer, Colon polyps, or Stomach cancer.  ROS:   Review of Systems  Constitution: Negative for decreased appetite, fever and weight gain.  HENT: Negative for congestion, ear discharge, hoarse voice and sore throat.   Eyes: Negative for discharge, redness, vision loss in right eye and visual halos.  Cardiovascular: Negative for chest pain, dyspnea on exertion, leg swelling,  orthopnea and palpitations.  Respiratory: Negative for cough, hemoptysis, shortness of breath and snoring.   Endocrine: Negative for heat intolerance and polyphagia.  Hematologic/Lymphatic: Negative for bleeding problem. Does not bruise/bleed easily.  Skin: Negative for flushing, nail changes, rash and suspicious lesions.  Musculoskeletal: Negative for arthritis, joint pain, muscle cramps, myalgias, neck pain and stiffness.  Gastrointestinal: Negative for abdominal pain, bowel incontinence, diarrhea and excessive appetite.  Genitourinary: Negative for decreased libido, genital sores and incomplete emptying.  Neurological: Negative for brief paralysis, focal weakness, headaches and loss of balance.  Psychiatric/Behavioral: Negative for altered mental status, depression and suicidal ideas.  Allergic/Immunologic: Negative for HIV exposure and persistent infections.    EKGs/Labs/Other Studies Reviewed:    The following studies were reviewed today:   EKG: None today  Echo 11/2020 IMPRESSIONS   1. Left ventricular ejection fraction, by estimation, is 40 to 45%. The left ventricle has mildly decreased function. The left ventricle has no regional wall motion abnormalities. Left ventricular diastolic parameters are consistent with Grade I diastolic dysfunction (impaired relaxation).   2. Right ventricular systolic function is mildly reduced. The right ventricular size is normal.   3. The mitral valve is normal in structure. Trivial mitral valve regurgitation. No evidence of mitral stenosis.   4. The aortic valve is grossly normal. Aortic valve regurgitation is not visualized. There is a 23 mm Edwards Magna Valve valve present in the aortic position. Procedure Date: 03/13/2018. Valve appears to be functioning normally. Mean gradient across the valve 43mm HG.   5. The inferior vena cava is normal in size with greater than 50%  respiratory variability, suggesting right atrial pressure of 3 mmHg.  FINDINGS   Left Ventricle: Left ventricular ejection fraction, by estimation, is 40 to 45%. The left ventricle has mildly decreased function. The left ventricle has no regional wall motion abnormalities. The left ventricular internal cavity size was normal in size. There is no left ventricular hypertrophy. Abnormal (paradoxical) septal motion consistent with post-operative status. Left ventricular diastolic parameters are consistent with Grade I diastolic dysfunction (impaired relaxation).  Right Ventricle: The right ventricular size is normal. No increase in right ventricular wall thickness. Right ventricular systolic function is mildly reduced.   Left Atrium: Left atrial size was normal in size.   Right Atrium: Right atrial size was normal in size.   Pericardium: There is no evidence of pericardial effusion.   Mitral Valve: The mitral valve is normal in structure. There is mild  thickening of the mitral valve leaflet(s). Trivial mitral valve  regurgitation. No evidence of mitral valve stenosis.   Tricuspid Valve: The tricuspid valve is normal in structure. Tricuspid  valve regurgitation is trivial. No evidence of tricuspid stenosis.   Aortic Valve: The aortic valve is grossly normal. Aortic valve  regurgitation is not visualized. No aortic stenosis is present. Aortic  valve mean gradient measures 15.0 mmHg. Aortic valve peak gradient  measures 27.0 mmHg. Aortic valve area, by VTI  measures 1.46 cm. There is a 23 mm Edwards Magna Valve valve present in  the aortic position. Procedure Date: 03/13/2018.   Pulmonic Valve: The pulmonic valve was normal in structure. Pulmonic valve  regurgitation is not visualized. No evidence of pulmonic stenosis.   Aorta: The aortic root is normal in size and structure.   Venous: The inferior vena cava is normal in size with greater than 50%  respiratory variability, suggesting right atrial pressure of 3 mmHg.   IAS/Shunts: No atrial level shunt  detected by color flow Doppler.       Recent Labs: 06/07/2020: Magnesium 1.9 09/05/2020: B Natriuretic Peptide 76.4 03/28/2021: ALT 8; BUN 15; Creatinine 0.98; Hemoglobin 13.7; Platelet Count 440; Potassium 4.1; Sodium 141  Recent Lipid Panel    Component Value Date/Time   CHOL 115 09/13/2020 1041   TRIG 69.0 09/13/2020 1041   HDL 44.30 09/13/2020 1041   CHOLHDL 3 09/13/2020 1041   VLDL 13.8 09/13/2020 1041   LDLCALC 56 09/13/2020 1041   LDLDIRECT 177.6 10/07/2007 1001    Physical Exam:    VS:  BP (!) 150/86 (BP Location: Left Arm)   Pulse 68   Ht 6\' 4"  (1.93 m)   Wt 174 lb 6.4 oz (79.1 kg)   SpO2 97%   BMI 21.23 kg/m     Wt Readings from Last 3 Encounters:  05/30/21 174 lb 6.4 oz (79.1 kg)  03/30/21 171 lb (77.6 kg)  03/28/21 171 lb 12.8 oz (77.9 kg)     GEN: Well nourished, well developed in no acute distress HEENT: Normal NECK: No JVD; No carotid bruits LYMPHATICS: No lymphadenopathy CARDIAC: S1S2 noted,RRR, no murmurs, rubs, gallops RESPIRATORY:  Clear to auscultation without rales, wheezing or rhonchi  ABDOMEN: Soft, non-tender, non-distended, +bowel sounds, no guarding. EXTREMITIES: No edema, No cyanosis, no clubbing MUSCULOSKELETAL:  No deformity  SKIN: Warm and dry NEUROLOGIC:  Alert and oriented x 3, non-focal PSYCHIATRIC:  Normal affect, good insight  ASSESSMENT:    1. Nonischemic cardiomyopathy ([place])   2. Primary hypertension   3. Depressed left ventricular ejection fraction   4. Coronary artery disease involving native coronary artery of native heart without angina pectoris   5. Paroxysmal atrial  fibrillation ([place])   6. Type 2 diabetes mellitus with hyperglycemia, with long-term current use of insulin ([facility])   7. H/O aortic valve replacement    PLAN:     1.  He is hypertensive in the office today.  Going to increase hydralazine 25 mg 3 times daily.  Other than that he seems to be doing well.  He is well compensated.  He is very active at  home. Thankfully his EF improved to 40 to 45% therefore we will hold off on any evaluation for ICD.  No anginal symptoms..  Hyperlipidemia - continue with current statin medication.  Get blood work today.  The patient is in agreement with the above plan. The patient left the office in stable condition.  The patient will follow up in   Medication Adjustments/Labs and Tests Ordered: Current medicines are reviewed at length with the patient today.  Concerns regarding medicines are outlined above.  Orders Placed This Encounter  Procedures   Basic Metabolic Panel (BMET)   Magnesium   Meds ordered this encounter  Medications   hydrALAZINE (APRESOLINE) 25 MG tablet    Sig: Take 1 tablet (25 mg total) by mouth 3 (three) times daily.    Dispense:  270 tablet    Refill:  3    Patient Instructions  Medication Instructions:  Your physician has recommended you make the following change in your medication:  INCREASE: Hydralazine 25 mg three times daily *If you need a refill on your cardiac medications before your next appointment, please call your pharmacy*   Lab Work: Your physician recommends that you return for lab work in:  TODAY: BMET, [place] If you have labs (blood work) drawn today and your tests are completely normal, you will receive your results only by: [place] (if you have [place]) OR A paper copy in the mail If you have any lab test that is abnormal or we need to change your treatment, we will call you to review the results.   Testing/Procedures: None   Follow-Up: At [facility], you and your health needs are our priority.  As part of our continuing mission to provide you with exceptional heart care, we have created designated Provider Care Teams.  These Care Teams include your primary Cardiologist (physician) and Advanced Practice Providers (APPs -  Physician Assistants and Nurse Practitioners) who all work together to provide you with the care you need, when  you need it.  We recommend signing up for the patient portal called "MyChart".  Sign up information is provided on this After Visit Summary.  MyChart is used to connect with patients for Virtual Visits (Telemedicine).  Patients are able to view lab/test results, encounter notes, upcoming appointments, etc.  Non-urgent messages can be sent to your provider as well.   To learn more about what you can do with MyChart, go to [email or web address].    Your next appointment:   6 month(s)  The format for your next appointment:   In Person  Provider:   Berniece Salines, DO [address], [place], [place] [address]    Other Instructions     Adopting a Healthy Lifestyle.  Know what a healthy weight is for you (roughly BMI <25) and aim to maintain this   Aim for 7+ servings of fruits and vegetables daily   65-80+ fluid ounces of water or unsweet tea for healthy kidneys   Limit to max 1 drink of alcohol per day; avoid smoking/tobacco   Limit animal fats in diet  for cholesterol and heart health - choose grass fed whenever available   Avoid highly processed foods, and foods high in saturated/trans fats   Aim for low stress - take time to unwind and care for your mental health   Aim for 150 min of moderate intensity exercise weekly for heart health, and weights twice weekly for bone health   Aim for 7-9 hours of sleep daily   When it comes to diets, agreement about the perfect plan isnt easy to find, even among the experts. Experts at [employer] developed an idea known as the Healthy Eating Plate. Just imagine a plate divided into logical, healthy portions.   The emphasis is on diet quality:   Load up on vegetables and fruits - one-half of your plate: Aim for color and variety, and remember that potatoes dont count.   Go for whole grains - one-quarter of your plate: Whole wheat, barley, wheat berries, quinoa, oats, brown rice, and foods made with them. If  you want pasta, go with whole wheat pasta.   Protein power - one-quarter of your plate: Fish, chicken, beans, and nuts are all healthy, versatile protein sources. Limit red meat.   The diet, however, does go beyond the plate, offering a few other suggestions.   Use healthy plant oils, such as olive, canola, soy, corn, sunflower and peanut. Check the labels, and avoid partially hydrogenated oil, which have unhealthy trans fats.   If youre thirsty, drink water. Coffee and tea are good in moderation, but skip sugary drinks and limit milk and dairy products to one or two daily servings.   The type of carbohydrate in the diet is more important than the amount. Some sources of carbohydrates, such as vegetables, fruits, whole grains, and beans-are healthier than others.   Finally, stay active  Signed, Berniece Salines, DO  05/30/2021 9:42 AM    [place]

## 2021-06-03 ENCOUNTER — Other Ambulatory Visit: Payer: Self-pay | Admitting: Internal Medicine

## 2021-06-04 ENCOUNTER — Other Ambulatory Visit: Payer: Self-pay

## 2021-06-04 ENCOUNTER — Telehealth: Payer: Self-pay | Admitting: *Deleted

## 2021-06-04 ENCOUNTER — Encounter: Payer: Self-pay | Admitting: Internal Medicine

## 2021-06-04 ENCOUNTER — Ambulatory Visit (INDEPENDENT_AMBULATORY_CARE_PROVIDER_SITE_OTHER): Payer: Medicare Other | Admitting: Internal Medicine

## 2021-06-04 VITALS — BP 132/90 | HR 76 | Temp 97.8°F | Resp 18 | Ht 76.0 in | Wt 176.0 lb

## 2021-06-04 DIAGNOSIS — K621 Rectal polyp: Secondary | ICD-10-CM | POA: Diagnosis not present

## 2021-06-04 DIAGNOSIS — I2510 Atherosclerotic heart disease of native coronary artery without angina pectoris: Secondary | ICD-10-CM

## 2021-06-04 DIAGNOSIS — E785 Hyperlipidemia, unspecified: Secondary | ICD-10-CM

## 2021-06-04 DIAGNOSIS — N529 Male erectile dysfunction, unspecified: Secondary | ICD-10-CM | POA: Diagnosis not present

## 2021-06-04 DIAGNOSIS — Z125 Encounter for screening for malignant neoplasm of prostate: Secondary | ICD-10-CM | POA: Diagnosis not present

## 2021-06-04 DIAGNOSIS — Z23 Encounter for immunization: Secondary | ICD-10-CM

## 2021-06-04 DIAGNOSIS — I10 Essential (primary) hypertension: Secondary | ICD-10-CM | POA: Diagnosis not present

## 2021-06-04 DIAGNOSIS — E875 Hyperkalemia: Secondary | ICD-10-CM

## 2021-06-04 DIAGNOSIS — Z79899 Other long term (current) drug therapy: Secondary | ICD-10-CM

## 2021-06-04 LAB — PSA: PSA: 0.51 ng/mL (ref 0.10–4.00)

## 2021-06-04 LAB — LIPID PANEL
Cholesterol: 125 mg/dL (ref 0–200)
HDL: 44.4 mg/dL (ref >39.00)
LDL Cholesterol: 55 mg/dL (ref 0–99)
NonHDL: 80.34
Total CHOL/HDL Ratio: 3
Triglycerides: 128 mg/dL (ref 0.0–149.0)
VLDL: 25.6 mg/dL (ref 0.0–40.0)

## 2021-06-04 NOTE — Assessment & Plan Note (Signed)
Preventive care reviewed. Td : recommended  PNM 13: 2018.  PNM 23 2009, 2019 Shingrix: Recommended COVID-19: new vax recommended Flu shot today CCS: Had a colonoscopy  01/2020, see comments under rectal polyp Prostate cancer screening: Check PSA, DRE normal.  No symptoms. Healthcare power of attorney discussed.

## 2021-06-04 NOTE — Telephone Encounter (Signed)
Patients wife called to let us know that her husbands blood pressure has been high which has meant in the past that his blood is high.  Appt made for Friday for labs and possible phlebotomy per Dr Marin Olp.

## 2021-06-04 NOTE — Telephone Encounter (Deleted)
Patients wife called to let us know that her husband has had high blood pressure and this  usually mean

## 2021-06-04 NOTE — Assessment & Plan Note (Signed)
DM: Per Endo.  Foot exam negative CAD, cardiomyopathy: Saw cardiology last week, BP elevated, hydralazine increased to TID.  He also takes Imdur, metoprolol, Entresto, Aldactone.  Last potassium was slightly elevated, diet was recommended HTN: See above Polycythemia : Last visit with hematology August 2022, he was phlebotomized, High cholesterol: Last LDL  January 2022, continue Crestor, checking labs Rectal polyp: Found on DRE today, he has a history of large colon polyps, refer back to GI. ED: Some ED, some decreased sex drive, he is not interested in treatment.  Years ago he is testosterone was low, consider recheck at some point Preventive care reviewed RTC 6 months

## 2021-06-04 NOTE — Progress Notes (Signed)
Subjective:    Patient ID: [name], male    DOB: 07-28-1951, 70 y.o.   MRN: [id number]  DOS:  06/04/2021 Type of visit - description: Follow-up  I reviewed the notes from Endo, cardiology and hematology. The patient himself feels well.  Denies any chest pain, difficulty breathing, lower extremity edema. Denies any difficulty with the urine, dysuria or gross hematuria. Did report decreased libido and erec dysfunction  Denies fatigue, weight gain, depression.  Review of Systems See above   Past Medical History:  Diagnosis Date   Abnormal myocardial perfusion study 2006   EF 44% ? inferoseptal ischemia. no cath done   Benign neoplasm of cecum    Breast mass, right 03/2008   Cardiomyopathy ([place]) 07/20/2018   Ejection fraction 4045% in September 2019   Centrilobular emphysema ([place]) 08/25/2018   02/04/2018-high-res CT- no evidence of interstitial lung disease, severe centrilobular emphysema and mild diffuse bronchial wall thickening suggesting COPD, no calcified pleural plaques no pleural effusion, mild smooth bilateral pleural thickening which is nonspecific  02/04/2018-pulmonary function test- FVC 3.37 (71% predicted), postbronchodilator ratio 50, postbronchodilator FEV1 54, mid flow reve   CHF (congestive heart failure) ([place])    COLONIC POLYPS, HX OF 03/18/2007   Qualifier: Diagnosis of  By: [name]    Contact dermatitis 01/31/2013   COPD (chronic obstructive pulmonary disease) ([place])    "dx'd 12/2017"   COPD GOLD II A  08/25/2018   02/04/2018-high-res CT- no evidence of interstitial lung disease, severe centrilobular emphysema and mild diffuse bronchial wall thickening suggesting COPD, no calcified pleural plaques no pleural effusion, mild smooth bilateral pleural thickening which is nonspecific  02/04/2018-pulmonary function test- FVC 3.37 (71% predicted), postbronchodilator ratio 50, postbronchodilator FEV1 54, mid flow reve   Coronary artery disease    Critical  aortic valve stenosis 01/12/2018   Calculated valve area less than 0.5 cm mean gradient more than 60 mm of mercury   DEGENERATIVE JOINT DISEASE, BACK 03/18/2007   Qualifier: Diagnosis of  By: [name]    Depressed left ventricular ejection fraction 03/28/2020   Diabetes mellitus ([place]) 07/25/2020   Dyslipidemia 04/20/2020   Dyspnea on exertion 01/12/2018   ERECTILE DYSFUNCTION, ORGANIC 02/10/2009   Qualifier: Diagnosis of  By: [name]    Former cigarette smoker 10/12/2018   Quit in 2012 41-pack-year smoking history   Goals of care, counseling/discussion 08/24/2020   H/O aortic valve replacement 05/05/2020   Heart murmur    HEMORRHOIDS, INTERNAL 03/18/2007   Qualifier: Diagnosis of  By: [name]    HTN (hypertension) 10/21/2007        Hyperlipemia 10/21/2007   Qualifier: Diagnosis of  By: [name]    Hypertension    Hypoglycemia due to insulin 03/26/2020   Low back pain    MYOCARDIAL PERFUSION SCAN, WITH STRESS TEST, ABNORMAL 08/11/2008   Qualifier: Diagnosis of  By: Johnsie Cancel, MD, Rona Ravens    Nocturnal hypoxemia 08/25/2018   04/09/18 >>> ONO - telephone note>>> Patient's ONO on room air showed that he was less than 88% for more than an hour. Patient needs to be on 2 L at HS.  10/06/2018-overnight oximetry-entire duration was 4 hours and 23 minutes, SPO2 less than 88% for 25 minutes and 32 seconds     Oxygen deficiency    uses 2L at night   Paroxysmal atrial fibrillation ([place]) 10/12/2018   PCP NOTES >>>>>>>>>>>>>>>>> 05/14/2020   Polycythemia vera ([place])  08/04/2020   S/P AVR 03/04/2018   AVR 23 mm Edwards Magna Valve - bioprostetic 03/04/2018 LIMA - LAD   Status post coronary artery bypass graft 03/20/2018   LIMA to LAD during aortic valve replacement surgery 03/04/2018   Type 2 diabetes mellitus with hyperglycemia, with long-term current use of insulin ([place]) 04/20/2020   Type 2 diabetes mellitus, uncontrolled, with neuropathy 05/20/2008   Qualifier:  Diagnosis of  By: [name]     Past Surgical History:  Procedure Laterality Date   ABDOMINAL AORTOGRAM N/A 01/13/2018   Procedure: ABDOMINAL AORTOGRAM;  Surgeon: Troy Sine, MD;  Location: [facility];  Service: Cardiovascular;  Laterality: N/A;   AORTIC VALVE REPLACEMENT N/A 03/04/2018   Procedure: AORTIC VALVE REPLACEMENT (AVR) 46mm Edwards Magna Ease Tissue Valve.;  Surgeon: Melrose Nakayama, MD;  Location: [facility];  Service: Open Heart Surgery;  Laterality: N/A;   CARDIAC VALVE REPLACEMENT     COLONOSCOPY W/ POLYPECTOMY     COLONOSCOPY WITH PROPOFOL N/A 02/22/2020   Procedure: COLONOSCOPY WITH PROPOFOL;  Surgeon: Doran Stabler, MD;  Location: [facility];  Service: Gastroenterology;  Laterality: N/A;   CORONARY ARTERY BYPASS GRAFT N/A 03/04/2018   Procedure: CORONARY ARTERY BYPASS GRAFTING (CABG) x1:  LIMA to LAD.;  Surgeon: Melrose Nakayama, MD;  Location: [facility];  Service: Open Heart Surgery;  Laterality: N/A;   HEMOSTASIS CLIP PLACEMENT  02/22/2020   Procedure: HEMOSTASIS CLIP PLACEMENT;  Surgeon: Doran Stabler, MD;  Location: [facility];  Service: Gastroenterology;;   INTRAVASCULAR PRESSURE WIRE/FFR STUDY N/A 05/10/2020   Procedure: INTRAVASCULAR PRESSURE WIRE/FFR STUDY;  Surgeon: Sherren Mocha, MD;  Location: [facility];  Service: Cardiovascular;  Laterality: N/A;   LEFT HEART CATH AND CORS/GRAFTS ANGIOGRAPHY N/A 05/10/2020   Procedure: LEFT HEART CATH AND CORS/GRAFTS ANGIOGRAPHY;  Surgeon: Sherren Mocha, MD;  Location: [facility];  Service: Cardiovascular;  Laterality: N/A;   LIPOMA EXCISION Left 07/2008    "back of shoulder" Dr Gershon Crane   POLYPECTOMY  02/22/2020   Procedure: POLYPECTOMY;  Surgeon: Doran Stabler, MD;  Location: [facility];  Service: Gastroenterology;;   RIGHT/LEFT HEART CATH AND CORONARY ANGIOGRAPHY N/A 01/13/2018   Procedure: RIGHT/LEFT HEART CATH AND CORONARY ANGIOGRAPHY;  Surgeon: Troy Sine, MD;   Location: [facility];  Service: Cardiovascular;  Laterality: N/A;   TEE WITHOUT CARDIOVERSION N/A 03/04/2018   Procedure: TRANSESOPHAGEAL ECHOCARDIOGRAM (TEE);  Surgeon: Melrose Nakayama, MD;  Location: [facility];  Service: Open Heart Surgery;  Laterality: N/A;   TONSILLECTOMY     WISDOM TOOTH EXTRACTION      Allergies as of 06/04/2021       Reactions   Lisinopril Other (See Comments)   Hyperkalemia   Hydrochlorothiazide Hives   Amlodipine Besylate Other (See Comments)   REACTION: ? caused left axillary nodules/chest flutter   Farxiga [dapagliflozin] Rash, Other (See Comments)   "swollen penis"        Medication List        Accurate as of June 04, 2021  9:23 PM. If you have any questions, ask your nurse or doctor.          acetaminophen 500 MG tablet Commonly known as: TYLENOL Take 1,000 mg by mouth every 6 (six) hours as needed for moderate pain or headache.   aspirin 325 MG EC tablet Take 1 tablet (325 mg total) by mouth daily.   BD Pen Needle Nano 2nd Gen 32G  X 4 MM Misc Generic drug: Insulin Pen Needle Inject 1 Device into the skin in the morning, at noon, in the evening, and at bedtime.   cetirizine 10 MG tablet Commonly known as: ZYRTEC Take 10 mg by mouth daily as needed for allergies.   Entresto 97-103 MG Generic drug: sacubitril-valsartan TAKE 1 TABLET BY MOUTH TWICE A DAY   glucose blood test strip Commonly known as: Prodigy No Coding Blood Gluc Use as instructed to check blood sugar twice a day.  DX  E11.40   hydrALAZINE 25 MG tablet Commonly known as: APRESOLINE Take 1 tablet (25 mg total) by mouth 3 (three) times daily.   isosorbide mononitrate 30 MG 24 hr tablet Commonly known as: IMDUR Take 15 mg by mouth daily.   Levemir FlexTouch 100 UNIT/ML FlexPen Generic drug: insulin detemir Inject 18 Units into the skin every morning.   metFORMIN 1000 MG tablet Commonly known as: GLUCOPHAGE Take 1 tablet (1,000 mg total) by mouth 2  (two) times daily with a meal.   metoprolol succinate 50 MG 24 hr tablet Commonly known as: TOPROL-XL Take 50 mg by mouth daily.   NovoLOG FlexPen 100 UNIT/ML FlexPen Generic drug: insulin aspart Inject 6 Units into the skin with breakfast, with lunch, and with evening meal.   rosuvastatin 20 MG tablet Commonly known as: CRESTOR TAKE 1 TABLET BY MOUTH EVERY DAY   spironolactone 25 MG tablet Commonly known as: ALDACTONE TAKE 1/2 TABLET BY MOUTH EVERY DAY   VITAMIN B-12 PO Take 1 tablet by mouth daily.           Objective:   Physical Exam BP 132/90 (BP Location: Left Arm, Patient Position: Sitting, Cuff Size: Small)   Pulse 76   Temp 97.8 F (36.6 C) (Oral)   Resp 18   Ht 6\' 4"  (1.93 m)   Wt 176 lb (79.8 kg)   SpO2 93%   BMI 21.42 kg/m  General:   Well developed, NAD, BMI noted. HEENT:  Normocephalic . Face symmetric, atraumatic Lungs:  CTA B Normal respiratory effort, no intercostal retractions, no accessory muscle use. Heart: RRR,  no murmur.  DM foot exam: No edema, good pedal pulses, pinprick examination normal DRE: Normal sphincter tone, prostate normal, I did feel polyp at the left side, seems to be pedunculated, 8 mm? Skin: Not pale. Not jaundice Neurologic:  alert & oriented X3.  Speech normal, gait appropriate for age and unassisted Psych--  Cognition and judgment appear intact.  Cooperative with normal attention span and concentration.  Behavior appropriate. No anxious or depressed appearing.      Assessment     Assessment (new patient 03/2020) DM HTN CAD, CABG and aortic valve replacement due to stenosis on 02/2018, cardiomyopathy, cath 05/10/2020, Rx medical treatment COPD Polycythemia vera, + JAK2 Dx [id number].  PLAN:  DM: Per Endo.  Foot exam negative CAD, cardiomyopathy: Saw cardiology last week, BP elevated, hydralazine increased to TID.  He also takes Imdur, metoprolol, Entresto, Aldactone.  Last potassium was slightly elevated, diet was  recommended HTN: See above Polycythemia : Last visit with hematology August 2022, he was phlebotomized, High cholesterol: Last LDL  January 2022, continue Crestor, checking labs Rectal polyp: Found on DRE today, he has a history of large colon polyps, refer back to GI. ED: Some ED, some decreased sex drive, he is not interested in treatment.  Years ago he is testosterone was low, consider recheck at some point Preventive care reviewed RTC 6 months  This visit occurred during the SARS-CoV-2 public health emergency.  Safety protocols were in place, including screening questions prior to the visit, additional usage of staff PPE, and extensive cleaning of exam room while observing appropriate contact time as indicated for disinfecting solutions.

## 2021-06-04 NOTE — Patient Instructions (Addendum)
Recommend to proceed with the following vaccines at your pharmacy:  Shingrix (shingles) Tdap (tetanus) Covid #4   Check the  blood pressure twice  weekly  BP GOAL is between 110/65 and  135/85. If it is consistently higher or lower, let me know  Please call gastroenterology at [phone].  GO TO THE LAB : Get the blood work     GO TO THE FRONT DESK, [place] Come back for checkup in 6 months   Follow a low potassium diet  [email or web address]

## 2021-06-07 ENCOUNTER — Other Ambulatory Visit: Payer: Self-pay | Admitting: *Deleted

## 2021-06-07 DIAGNOSIS — D45 Polycythemia vera: Secondary | ICD-10-CM

## 2021-06-07 DIAGNOSIS — D75839 Thrombocytosis, unspecified: Secondary | ICD-10-CM

## 2021-06-07 DIAGNOSIS — Z7189 Other specified counseling: Secondary | ICD-10-CM

## 2021-06-07 DIAGNOSIS — D751 Secondary polycythemia: Secondary | ICD-10-CM

## 2021-06-08 ENCOUNTER — Other Ambulatory Visit: Payer: Self-pay

## 2021-06-08 ENCOUNTER — Inpatient Hospital Stay: Payer: Medicare Other

## 2021-06-08 ENCOUNTER — Inpatient Hospital Stay: Payer: Medicare Other | Attending: Hematology & Oncology

## 2021-06-08 DIAGNOSIS — D75839 Thrombocytosis, unspecified: Secondary | ICD-10-CM

## 2021-06-08 DIAGNOSIS — D751 Secondary polycythemia: Secondary | ICD-10-CM

## 2021-06-08 DIAGNOSIS — D45 Polycythemia vera: Secondary | ICD-10-CM | POA: Diagnosis not present

## 2021-06-08 DIAGNOSIS — Z7189 Other specified counseling: Secondary | ICD-10-CM

## 2021-06-08 LAB — CBC WITH DIFFERENTIAL (CANCER CENTER ONLY)
Abs Immature Granulocytes: 0.04 10*3/uL (ref 0.00–0.07)
Basophils Absolute: 0.1 10*3/uL (ref 0.0–0.1)
Basophils Relative: 1 %
Eosinophils Absolute: 0.2 10*3/uL (ref 0.0–0.5)
Eosinophils Relative: 2 %
HCT: 43.4 % (ref 39.0–52.0)
Hemoglobin: 12.9 g/dL — ABNORMAL LOW (ref 13.0–17.0)
Immature Granulocytes: 0 %
Lymphocytes Relative: 27 %
Lymphs Abs: 2.4 10*3/uL (ref 0.7–4.0)
MCH: 18.8 pg — ABNORMAL LOW (ref 26.0–34.0)
MCHC: 29.7 g/dL — ABNORMAL LOW (ref 30.0–36.0)
MCV: 63.3 fL — ABNORMAL LOW (ref 80.0–100.0)
Monocytes Absolute: 1 10*3/uL (ref 0.1–1.0)
Monocytes Relative: 11 %
Neutro Abs: 5.4 10*3/uL (ref 1.7–7.7)
Neutrophils Relative %: 59 %
Platelet Count: 455 10*3/uL — ABNORMAL HIGH (ref 150–400)
RBC: 6.86 MIL/uL — ABNORMAL HIGH (ref 4.22–5.81)
RDW: 22.4 % — ABNORMAL HIGH (ref 11.5–15.5)
WBC Count: 9 10*3/uL (ref 4.0–10.5)
nRBC: 0 % (ref 0.0–0.2)

## 2021-06-08 LAB — CMP (CANCER CENTER ONLY)
ALT: 9 U/L (ref 0–44)
AST: 14 U/L — ABNORMAL LOW (ref 15–41)
Albumin: 4.3 g/dL (ref 3.5–5.0)
Alkaline Phosphatase: 44 U/L (ref 38–126)
Anion gap: 6 (ref 5–15)
BUN: 15 mg/dL (ref 8–23)
CO2: 29 mmol/L (ref 22–32)
Calcium: 9.7 mg/dL (ref 8.9–10.3)
Chloride: 104 mmol/L (ref 98–111)
Creatinine: 1.01 mg/dL (ref 0.61–1.24)
GFR, Estimated: >60 mL/min (ref >60)
Glucose, Bld: 105 mg/dL — ABNORMAL HIGH (ref 70–99)
Potassium: 4.6 mmol/L (ref 3.5–5.1)
Sodium: 139 mmol/L (ref 135–145)
Total Bilirubin: 0.4 mg/dL (ref 0.3–1.2)
Total Protein: 7 g/dL (ref 6.5–8.1)

## 2021-06-08 NOTE — Progress Notes (Signed)
Phlebotomy not needed today due to HCT being 43.4. Patient given a copy of labs. I offered to check his blood pressure but he denied having it done. He stated,"I found the culprit of why my pressure is high. I've been eating a lot of salty foods." He verbalized understanding of phlebotomy.

## 2021-06-26 ENCOUNTER — Encounter: Payer: Self-pay | Admitting: Internal Medicine

## 2021-06-26 ENCOUNTER — Ambulatory Visit (INDEPENDENT_AMBULATORY_CARE_PROVIDER_SITE_OTHER): Payer: Medicare Other | Admitting: Internal Medicine

## 2021-06-26 ENCOUNTER — Other Ambulatory Visit: Payer: Self-pay

## 2021-06-26 VITALS — BP 130/90 | HR 74 | Ht 76.0 in | Wt 176.0 lb

## 2021-06-26 DIAGNOSIS — E1165 Type 2 diabetes mellitus with hyperglycemia: Secondary | ICD-10-CM

## 2021-06-26 DIAGNOSIS — E1159 Type 2 diabetes mellitus with other circulatory complications: Secondary | ICD-10-CM | POA: Diagnosis not present

## 2021-06-26 DIAGNOSIS — Z794 Long term (current) use of insulin: Secondary | ICD-10-CM

## 2021-06-26 LAB — POCT GLYCOSYLATED HEMOGLOBIN (HGB A1C): Hemoglobin A1C: 7.5 % — AB (ref 4.0–5.6)

## 2021-06-26 MED ORDER — NOVOLOG FLEXPEN 100 UNIT/ML ~~LOC~~ SOPN: 8.0000 [IU] | PEN_INJECTOR | Freq: Three times a day (TID) | SUBCUTANEOUS | 4 refills | Status: DC

## 2021-06-26 NOTE — Patient Instructions (Addendum)
-   Continue  Metformin 1000 mg, 1 tablet TWICE a day  - Continue  Levemir 18 units daily  - Increase  NOVOLOG 8 units with each meal  - Novolog 2 units with Lunch Snack  - Novlog correctional insulin: ADD extra units on insulin to your meal-time Novolog  dose if your blood sugars are higher than 185. Use the scale below to help guide you:     Blood sugar before meal Number of units to inject  Less than 185 0 unit  186 - 225 1 units  226 -  265 2 units  265 -  305 3 units  306 -  345 4 units       HOW TO TREAT LOW BLOOD SUGARS (Blood sugar LESS THAN 70 MG/DL) Please follow the RULE OF 15 for the treatment of hypoglycemia treatment (when your (blood sugars are less than 70 mg/dL)   STEP 1: Take 15 grams of carbohydrates when your blood sugar is low, which includes:  3-4 GLUCOSE TABS  OR 3-4 OZ OF JUICE OR REGULAR SODA OR ONE TUBE OF GLUCOSE GEL    STEP 2: RECHECK blood sugar in 15 MINUTES STEP 3: If your blood sugar is still low at the 15 minute recheck --> then, go back to STEP 1 and treat AGAIN with another 15 grams of carbohydrates.     HOW TO TREAT LOW BLOOD SUGARS (Blood sugar LESS THAN 70 MG/DL) Please follow the RULE OF 15 for the treatment of hypoglycemia treatment (when your (blood sugars are less than 70 mg/dL)   STEP 1: Take 15 grams of carbohydrates when your blood sugar is low, which includes:  3-4 GLUCOSE TABS  OR 3-4 OZ OF JUICE OR REGULAR SODA OR ONE TUBE OF GLUCOSE GEL    STEP 2: RECHECK blood sugar in 15 MINUTES STEP 3: If your blood sugar is still low at the 15 minute recheck --> then, go back to STEP 1 and treat AGAIN with another 15 grams of carbohydrates.

## 2021-06-26 NOTE — Progress Notes (Signed)
Name: [name]  Age/ Sex: 70 y.o., male   MRN/ DOB: [id number], [birth year]     PCP: Colon Branch, MD   Reason for Endocrinology Evaluation: Type 2 Diabetes Mellitus  Initial Endocrine Consultative Visit: 04/19/2020    PATIENT IDENTIFIER: [name] is a 70 y.o. male with a past medical history of COPD, non-obstructive CAD, severe AS ( S/P AVR/CABG), CHF, HTN , T2DM and OSA. The patient has followed with Endocrinology clinic since 04/19/2020 for consultative assistance with management of his diabetes.  DIABETIC HISTORY:  [name] was diagnosed with DM in 2009. He has been on metformin and insulin for years.Metfromin dose was reduced by 50% due to hypoglycemia per pt.  His hemoglobin A1c has ranged from 7.2% in 2021, peaking at 8.8%in 2018  On his initial visit to our clinic his A1c was 7.2 % We adjusted MDI regimen and continued metformin   Due to a low BMI of 20.99 we checked GAD-65 and islet cell Ab's which were negative    Prandial insulin stopped 10/2020 but by his 01/2021 he was already back on it and we continued   Wilder Glade stopped due to genital rash 01/2021  SUBJECTIVE:   During the last visit (02/13/2021): A1c 7.4 % We continued  Metformin and MDI regimen     Today (06/26/2021): [name] is here for a follow up on diabetes. He checks his blood sugars a 1 times daily. The patient has not  had hypoglycemic episodes since the last clinic visit.  He is S/P cardiac cath with  in 04/2020  Continues to follow with hematology for polycythemia vera   Denies nausea, vomiting or diarrhea     HOME DIABETES REGIMEN:  Levemir 18 units daily  Metformin 1000 mg , BID Novolog 6 units with each meal [id number] (BG- 135/40)  Statin: yes ACE-I/ARB: yes   METER DOWNLOAD SUMMARY: Prodigy 7 day average 131  mg/dL   111- 191 mg/dL   DIABETIC COMPLICATIONS: Microvascular complications:  Retinopathy Denies: CKD , neuropathy  Last eye exam: Completed  09/14/2020   Macrovascular complications:  CAD Denies: PVD, CVA   HISTORY:  Past Medical History:  Past Medical History:  Diagnosis Date   Abnormal myocardial perfusion study 2006   EF 44% ? inferoseptal ischemia. no cath done   Benign neoplasm of cecum    Breast mass, right 03/2008   Cardiomyopathy ([place]) 07/20/2018   Ejection fraction 4045% in September 2019   Centrilobular emphysema ([place]) 08/25/2018   02/04/2018-high-res CT- no evidence of interstitial lung disease, severe centrilobular emphysema and mild diffuse bronchial wall thickening suggesting COPD, no calcified pleural plaques no pleural effusion, mild smooth bilateral pleural thickening which is nonspecific  02/04/2018-pulmonary function test- FVC 3.37 (71% predicted), postbronchodilator ratio 50, postbronchodilator FEV1 54, mid flow reve   CHF (congestive heart failure) ([place])    COLONIC POLYPS, HX OF 03/18/2007   Qualifier: Diagnosis of  By: [name]    Contact dermatitis 01/31/2013   COPD (chronic obstructive pulmonary disease) ([place])    "dx'd 12/2017"   COPD GOLD II A  08/25/2018   02/04/2018-high-res CT- no evidence of interstitial lung disease, severe centrilobular emphysema and mild diffuse bronchial wall thickening suggesting COPD, no calcified pleural plaques no pleural effusion, mild smooth bilateral pleural thickening which is nonspecific  02/04/2018-pulmonary function test- FVC 3.37 (71% predicted), postbronchodilator ratio 50, postbronchodilator FEV1 54, mid flow reve   Coronary artery disease    Critical aortic valve stenosis 01/12/2018  Calculated valve area less than 0.5 cm mean gradient more than 60 mm of mercury   DEGENERATIVE JOINT DISEASE, BACK 03/18/2007   Qualifier: Diagnosis of  By: [name]    Depressed left ventricular ejection fraction 03/28/2020   Diabetes mellitus ([place]) 07/25/2020   Dyslipidemia 04/20/2020   Dyspnea on exertion 01/12/2018   ERECTILE DYSFUNCTION, ORGANIC 02/10/2009    Qualifier: Diagnosis of  By: [name]    Former cigarette smoker 10/12/2018   Quit in 2012 41-pack-year smoking history   Goals of care, counseling/discussion 08/24/2020   H/O aortic valve replacement 05/05/2020   Heart murmur    HEMORRHOIDS, INTERNAL 03/18/2007   Qualifier: Diagnosis of  By: [name]    HTN (hypertension) 10/21/2007        Hyperlipemia 10/21/2007   Qualifier: Diagnosis of  By: [name]    Hypertension    Hypoglycemia due to insulin 03/26/2020   Low back pain    MYOCARDIAL PERFUSION SCAN, WITH STRESS TEST, ABNORMAL 08/11/2008   Qualifier: Diagnosis of  By: Johnsie Cancel, MD, Rona Ravens    Nocturnal hypoxemia 08/25/2018   04/09/18 >>> [name] - telephone note>>> [name] on room air showed that he was less than 88% for more than an hour. Patient needs to be on 2 L at HS.  10/06/2018-overnight oximetry-entire duration was 4 hours and 23 minutes, SPO2 less than 88% for 25 minutes and 32 seconds     Oxygen deficiency    uses 2L at night   Paroxysmal atrial fibrillation ([place]) 10/12/2018   PCP NOTES >>>>>>>>>>>>>>>>> 05/14/2020   Polycythemia vera ([place]) 08/04/2020   S/P AVR 03/04/2018   AVR 23 mm Edwards Magna Valve - bioprostetic 03/04/2018 LIMA - LAD   Status post coronary artery bypass graft 03/20/2018   LIMA to LAD during aortic valve replacement surgery 03/04/2018   Type 2 diabetes mellitus with hyperglycemia, with long-term current use of insulin ([place]) 04/20/2020   Type 2 diabetes mellitus, uncontrolled, with neuropathy 05/20/2008   Qualifier: Diagnosis of  By: [name]    Past Surgical History:  Past Surgical History:  Procedure Laterality Date   ABDOMINAL AORTOGRAM N/A 01/13/2018   Procedure: ABDOMINAL AORTOGRAM;  Surgeon: Troy Sine, MD;  Location: [place] [facility];  Service: Cardiovascular;  Laterality: N/A;   AORTIC VALVE REPLACEMENT N/A 03/04/2018   Procedure: AORTIC VALVE REPLACEMENT (AVR) 90mm Edwards Magna Ease Tissue Valve.;   Surgeon: Melrose Nakayama, MD;  Location: [place];  Service: Open Heart Surgery;  Laterality: N/A;   CARDIAC VALVE REPLACEMENT     COLONOSCOPY W/ POLYPECTOMY     COLONOSCOPY WITH PROPOFOL N/A 02/22/2020   Procedure: COLONOSCOPY WITH PROPOFOL;  Surgeon: Doran Stabler, MD;  Location: [facility];  Service: Gastroenterology;  Laterality: N/A;   CORONARY ARTERY BYPASS GRAFT N/A 03/04/2018   Procedure: CORONARY ARTERY BYPASS GRAFTING (CABG) x1:  LIMA to LAD.;  Surgeon: Melrose Nakayama, MD;  Location: [place];  Service: Open Heart Surgery;  Laterality: N/A;   HEMOSTASIS CLIP PLACEMENT  02/22/2020   Procedure: HEMOSTASIS CLIP PLACEMENT;  Surgeon: Doran Stabler, MD;  Location: [facility];  Service: Gastroenterology;;   INTRAVASCULAR PRESSURE WIRE/FFR STUDY N/A 05/10/2020   Procedure: INTRAVASCULAR PRESSURE WIRE/FFR STUDY;  Surgeon: Sherren Mocha, MD;  Location: [place] [facility];  Service: Cardiovascular;  Laterality: N/A;   LEFT HEART CATH AND CORS/GRAFTS ANGIOGRAPHY N/A 05/10/2020   Procedure: LEFT HEART CATH  AND CORS/GRAFTS ANGIOGRAPHY;  Surgeon: Sherren Mocha, MD;  Location: [place] [facility];  Service: Cardiovascular;  Laterality: N/A;   LIPOMA EXCISION Left 07/2008    "back of shoulder" Dr Gershon Crane   POLYPECTOMY  02/22/2020   Procedure: POLYPECTOMY;  Surgeon: Doran Stabler, MD;  [facility]: [facility];  Service: Gastroenterology;;   RIGHT/LEFT HEART CATH AND CORONARY ANGIOGRAPHY N/A 01/13/2018   Procedure: RIGHT/LEFT HEART CATH AND CORONARY ANGIOGRAPHY;  Surgeon: Troy Sine, MD;  Location: [place] [facility];  Service: Cardiovascular;  Laterality: N/A;   TEE WITHOUT CARDIOVERSION N/A 03/04/2018   Procedure: TRANSESOPHAGEAL ECHOCARDIOGRAM (TEE);  Surgeon: Melrose Nakayama, MD;  Location: [place];  Service: Open Heart Surgery;  Laterality: N/A;   TONSILLECTOMY     WISDOM TOOTH EXTRACTION     Social History:  reports that he quit smoking about 10 years ago. His smoking  use included cigarettes. He has a 61.50 pack-year smoking history. He has never used smokeless tobacco. He reports current alcohol use. He reports that he does not currently use drugs. Family History:  Family History  Problem Relation Age of Onset   Melanoma Father 4       deceased secondary to melanoma   Alzheimer's disease Father    Diabetes Father    Hypertension Mother        alive -68   Colon cancer Neg Hx    Esophageal cancer Neg Hx    Colon polyps Neg Hx    Stomach cancer Neg Hx      HOME MEDICATIONS: Allergies as of 06/26/2021       Reactions   Lisinopril Other (See Comments)   Hyperkalemia   Hydrochlorothiazide Hives   Amlodipine Besylate Other (See Comments)   REACTION: ? caused left axillary nodules/chest flutter   Farxiga [dapagliflozin] Rash, Other (See Comments)   "swollen penis"        Medication List        Accurate as of June 26, 2021  9:25 AM. If you have any questions, ask your nurse or doctor.          acetaminophen 500 MG tablet Commonly known as: TYLENOL Take 1,000 mg by mouth every 6 (six) hours as needed for moderate pain or headache.   aspirin 325 MG EC tablet Take 1 tablet (325 mg total) by mouth daily.   BD Pen Needle Nano 2nd Gen 32G X 4 MM Misc Generic drug: Insulin Pen Needle Inject 1 Device into the skin in the morning, at noon, in the evening, and at bedtime.   cetirizine 10 MG tablet Commonly known as: ZYRTEC Take 10 mg by mouth daily as needed for allergies.   Entresto 97-103 MG Generic drug: sacubitril-valsartan TAKE 1 TABLET BY MOUTH TWICE A DAY   glucose blood test strip Commonly known as: Prodigy No Coding Blood Gluc Use as instructed to check blood sugar twice a day.  DX  E11.40   hydrALAZINE 25 MG tablet Commonly known as: APRESOLINE Take 1 tablet (25 mg total) by mouth 3 (three) times daily.   isosorbide mononitrate 30 MG 24 hr tablet Commonly known as: IMDUR Take 15 mg by mouth daily.   Levemir  FlexTouch 100 UNIT/ML FlexPen Generic drug: insulin detemir Inject 18 Units into the skin every morning.   metFORMIN 1000 MG tablet Commonly known as: GLUCOPHAGE Take 1 tablet (1,000 mg total) by mouth 2 (two) times daily with a meal.   metoprolol succinate 50 MG 24 hr tablet Commonly known  as: TOPROL-XL Take 50 mg by mouth daily.   NovoLOG FlexPen 100 UNIT/ML FlexPen Generic drug: insulin aspart Inject 6 Units into the skin with breakfast, with lunch, and with evening meal.   rosuvastatin 20 MG tablet Commonly known as: CRESTOR TAKE 1 TABLET BY MOUTH EVERY DAY   spironolactone 25 MG tablet Commonly known as: ALDACTONE TAKE 1/2 TABLET BY MOUTH EVERY DAY   VITAMIN B-12 PO Take 1 tablet by mouth daily.         OBJECTIVE:   Vital Signs: BP 130/90 (BP Location: Left Arm, Patient Position: Sitting, Cuff Size: Small)   Pulse 74   Ht 6\' 4"  (1.93 m)   Wt 176 lb (79.8 kg)   SpO2 99%   BMI 21.42 kg/m   Wt Readings from Last 3 Encounters:  06/26/21 176 lb (79.8 kg)  06/04/21 176 lb (79.8 kg)  05/30/21 174 lb 6.4 oz (79.1 kg)     Exam: General: Pt appears well and is in NAD  Lungs: Clear with good BS bilat  Heart: RRR   Extremities: No pretibial edema.   Neuro: MS is good with appropriate affect, pt is alert and Ox3       DM Foot Exam 02/13/2021  The skin of the feet is intact without sores or ulcerations. The pedal pulses are 2+ on right and 2+ on left. The sensation is intact to a screening 5.07, 10 gram monofilament bilaterally    DATA REVIEWED: Results for LATOYA, DISKIN (MRN [id number]) as of 02/13/2021 07:21  Ref. Range 12/26/2020 09:34  Sodium Latest Ref Range: 135 - 145 mmol/L 139  Potassium Latest Ref Range: 3.5 - 5.1 mmol/L 4.4  Chloride Latest Ref Range: 98 - 111 mmol/L 104  CO2 Latest Ref Range: 22 - 32 mmol/L 28  Glucose Latest Ref Range: 70 - 99 mg/dL 145 (H)  BUN Latest Ref Range: 8 - 23 mg/dL 13  Creatinine Latest Ref Range: 0.61 - 1.24  mg/dL 0.96  Calcium Latest Ref Range: 8.9 - 10.3 mg/dL 9.3  Anion gap Latest Ref Range: 5 - 15  7  Alkaline Phosphatase Latest Ref Range: 38 - 126 U/L 40  Albumin Latest Ref Range: 3.5 - 5.0 g/dL 4.1  AST Latest Ref Range: 15 - 41 U/L 12 (L)  ALT Latest Ref Range: 0 - 44 U/L 8  Total Protein Latest Ref Range: 6.5 - 8.1 g/dL 6.7  Total Bilirubin Latest Ref Range: 0.3 - 1.2 mg/dL 0.5  GFR, Est Non African American Latest Ref Range: >60 mL/min >60    Lab Results  Component Value Date   HGBA1C 7.5 (A) 06/26/2021   HGBA1C 7.4 (A) 02/13/2021   HGBA1C 6.5 (A) 10/31/2020   Lab Results  Component Value Date   MICROALBUR 0.9 04/19/2020   LDLCALC 55 06/04/2021   CREATININE 1.01 06/08/2021   Lab Results  Component Value Date   MICRALBCREAT 7 04/19/2020     Lab Results  Component Value Date   CHOL 125 06/04/2021   HDL 44.40 06/04/2021   LDLCALC 55 06/04/2021   LDLDIRECT 177.6 10/07/2007   TRIG 128.0 06/04/2021   CHOLHDL 3 06/04/2021       ISLET CELL ANTIBODY SCREEN NEGATIVE NEGATIVE    Glutamic Acid [place] Ab <5 IU/mL <5      ASSESSMENT / PLAN / RECOMMENDATIONS:   1) Type 2 Diabetes Mellitus, Sub- Optimally controlled, With macrovascular  complications - Most recent A1c of 7.5 %. Goal A1c < 7.0 %.    -  He had to stop the [place] due to severe genital infection/irritation - He declined CGM technology as he is concerned he will knock it off with the type of the work he does  - He had declined GLP-1 agonists therapy  - He is comfortable using insulin and is not having side effects from it  and does not wish to change it - Pt snacks for lunch on peanutbuetter and crackers as well as on fruits, he was asked to take 2 units if Novolog with that as at this time he does not.     MEDICATIONS:  - Continue Metformin 1000 mg, 1 tablet BID - Continue  Levemir  18 units daily  - Increase  Novolog 8 units with each meal  - AJ:OINOMVEH (BG- 135/40)  EDUCATION / INSTRUCTIONS: BG  monitoring instructions: Patient is instructed to check his blood sugars 3 times a day, before meals  Call [place] [facility] clinic if: BG persistently < 100  I reviewed the Rule of 15 for the treatment of hypoglycemia in detail with the patient. Literature supplied.     2) Diabetic complications:  Eye: Does not have known diabetic retinopathy.  Neuro/ Feet: Does not have known diabetic peripheral neuropathy .  Renal: Patient does not have known baseline CKD. He   is  on an ACEI/ARB at present.   F/U in 4 months    Signed electronically by: Mack Guise, MD  [facility]  [address] [place], [place] [address] Phone: [phone] FAX: [phone]   CC: Colon Branch, [place] [address] [place] [address] Phone: [phone]  Fax: [phone]  Return to Endocrinology clinic as below: Future Appointments  Date Time Provider [place]  06/26/2021  9:30 AM Vietta Bonifield, Melanie Crazier, MD [facility] [place]  07/10/2021  2:20 PM Danis, Kirke Corin, MD [facility]  08/30/2021 11:45 AM [facility] LAB [facility] None  08/30/2021 12:15 PM Marin Olp, Rudell Cobb, MD [facility] None  08/30/2021 12:45 PM [facility] B3 [facility] None  12/03/2021 10:00 AM Colon Branch, MD [facility]  04/04/2022  8:20 AM [facility] HEALTH COACH [facility]

## 2021-07-10 ENCOUNTER — Encounter: Payer: Self-pay | Admitting: Gastroenterology

## 2021-07-10 ENCOUNTER — Ambulatory Visit (INDEPENDENT_AMBULATORY_CARE_PROVIDER_SITE_OTHER): Payer: Medicare Other | Admitting: Gastroenterology

## 2021-07-10 DIAGNOSIS — I2510 Atherosclerotic heart disease of native coronary artery without angina pectoris: Secondary | ICD-10-CM

## 2021-07-10 DIAGNOSIS — K629 Disease of anus and rectum, unspecified: Secondary | ICD-10-CM

## 2021-07-10 NOTE — Patient Instructions (Signed)
If you are age 70 or older, your body mass index should be between 23-30. Your Body mass index is 20.94 kg/m. If this is out of the aforementioned range listed, please consider follow up with your Primary Care Provider.  If you are age 61 or younger, your body mass index should be between 19-25. Your Body mass index is 20.94 kg/m. If this is out of the aformentioned range listed, please consider follow up with your Primary Care Provider.   ________________________________________________________  The  GI providers would like to encourage you to use [facility] to communicate with providers for non-urgent requests or questions.  Due to long hold times on the telephone, sending your provider a message by [facility] may be a faster and more efficient way to get a response.  Please allow 48 business hours for a response.  Please remember that this is for non-urgent requests.  _______________________________________________________  It was a pleasure to see you today!  Thank you for trusting me with your gastrointestinal care!

## 2021-07-10 NOTE — Progress Notes (Signed)
[facility] Consult Note:  History: [name] 07/10/2021  Referring provider: Colon Branch, MD  Reason for consult/chief complaint: Colon Cancer Screening (Patient has no complaints, rectal polyp discovered during recent physical)   Subjective  HPI: [name] was sent by primary care for concern of rectal polyp on DRE.  10 mm rectal tubular adenoma removed in 2018. Colonoscopy July 2021 removed 10 to 15 mm sessile cecal tubular adenoma without high-grade dysplasia.  Recommendation was repeat exam in 3 years. Saw primary care 06/04/2021, exam indicates the following: "DRE: Normal sphincter tone, prostate normal, I did feel polyp at the left side, seems to be pedunculated, 8 mm? "  While 2 denies abdominal pain or rectal bleeding.  He occasionally has loose stool. ROS:  Review of Systems Chronic dyspnea from COPD, no chest pain or abdominal pain.  Past Medical History: Past Medical History:  Diagnosis Date   Abnormal myocardial perfusion study 2006   EF 44% ? inferoseptal ischemia. no cath done   Benign neoplasm of cecum    Breast mass, right 03/2008   Cardiomyopathy ([place]) 07/20/2018   Ejection fraction 4045% in September 2019   Centrilobular emphysema ([place]) 08/25/2018   02/04/2018-high-res CT- no evidence of interstitial lung disease, severe centrilobular emphysema and mild diffuse bronchial wall thickening suggesting COPD, no calcified pleural plaques no pleural effusion, mild smooth bilateral pleural thickening which is nonspecific  02/04/2018-pulmonary function test- FVC 3.37 (71% predicted), postbronchodilator ratio 50, postbronchodilator FEV1 54, mid flow reve   CHF (congestive heart failure) ([place])    COLONIC POLYPS, HX OF 03/18/2007   Qualifier: Diagnosis of  By: [name]    Contact dermatitis 01/31/2013   COPD (chronic obstructive pulmonary disease) ([place])    "dx'd 12/2017"   COPD GOLD II A  08/25/2018   02/04/2018-high-res CT- no  evidence of interstitial lung disease, severe centrilobular emphysema and mild diffuse bronchial wall thickening suggesting COPD, no calcified pleural plaques no pleural effusion, mild smooth bilateral pleural thickening which is nonspecific  02/04/2018-pulmonary function test- FVC 3.37 (71% predicted), postbronchodilator ratio 50, postbronchodilator FEV1 54, mid flow reve   Coronary artery disease    Critical aortic valve stenosis 01/12/2018   Calculated valve area less than 0.5 cm mean gradient more than 60 mm of mercury   DEGENERATIVE JOINT DISEASE, BACK 03/18/2007   Qualifier: Diagnosis of  By: [name]    Depressed left ventricular ejection fraction 03/28/2020   Diabetes mellitus ([place]) 07/25/2020   Dyslipidemia 04/20/2020   Dyspnea on exertion 01/12/2018   ERECTILE DYSFUNCTION, ORGANIC 02/10/2009   Qualifier: Diagnosis of  By: [name]    Former cigarette smoker 10/12/2018   Quit in 2012 41-pack-year smoking history   Goals of care, counseling/discussion 08/24/2020   H/O aortic valve replacement 05/05/2020   Heart murmur    HEMORRHOIDS, INTERNAL 03/18/2007   Qualifier: Diagnosis of  By: [name]    HTN (hypertension) 10/21/2007        Hyperlipemia 10/21/2007   Qualifier: Diagnosis of  By: [name]    Hypertension    Hypoglycemia due to insulin 03/26/2020   Low back pain    MYOCARDIAL PERFUSION SCAN, WITH STRESS TEST, ABNORMAL 08/11/2008   Qualifier: Diagnosis of  By: Johnsie Cancel, MD, Rona Ravens    Nocturnal hypoxemia 08/25/2018   04/09/18 >>> ONO - telephone note>>> Patient's ONO on room air showed that he was less than 88% for more than  an hour. Patient needs to be on 2 L at HS.  10/06/2018-overnight oximetry-entire duration was 4 hours and 23 minutes, SPO2 less than 88% for 25 minutes and 32 seconds     Oxygen deficiency    uses 2L at night   Paroxysmal atrial fibrillation ([place]) 10/12/2018   PCP NOTES >>>>>>>>>>>>>>>>> 05/14/2020   Polycythemia vera  ([place]) 08/04/2020   S/P AVR 03/04/2018   AVR 23 mm Edwards Magna Valve - bioprostetic 03/04/2018 LIMA - LAD   Status post coronary artery bypass graft 03/20/2018   LIMA to LAD during aortic valve replacement surgery 03/04/2018   Type 2 diabetes mellitus with hyperglycemia, with long-term current use of insulin ([place]) 04/20/2020   Type 2 diabetes mellitus, uncontrolled, with neuropathy 05/20/2008   Qualifier: Diagnosis of  By: [name]      Past Surgical History: Past Surgical History:  Procedure Laterality Date   ABDOMINAL AORTOGRAM N/A 01/13/2018   Procedure: ABDOMINAL AORTOGRAM;  Surgeon: Troy Sine, MD;  Location: [facility];  Service: Cardiovascular;  Laterality: N/A;   AORTIC VALVE REPLACEMENT N/A 03/04/2018   Procedure: AORTIC VALVE REPLACEMENT (AVR) 22mm Edwards Magna Ease Tissue Valve.;  Surgeon: Melrose Nakayama, MD;  Location: [facility];  Service: Open Heart Surgery;  Laterality: N/A;   CARDIAC VALVE REPLACEMENT     COLONOSCOPY W/ POLYPECTOMY     COLONOSCOPY WITH PROPOFOL N/A 02/22/2020   Procedure: COLONOSCOPY WITH PROPOFOL;  Surgeon: Doran Stabler, MD;  Location: [facility];  Service: Gastroenterology;  Laterality: N/A;   CORONARY ARTERY BYPASS GRAFT N/A 03/04/2018   Procedure: CORONARY ARTERY BYPASS GRAFTING (CABG) x1:  LIMA to LAD.;  Surgeon: Melrose Nakayama, MD;  Location: [facility];  Service: Open Heart Surgery;  Laterality: N/A;   HEMOSTASIS CLIP PLACEMENT  02/22/2020   Procedure: HEMOSTASIS CLIP PLACEMENT;  Surgeon: Doran Stabler, MD;  Location: [facility];  Service: Gastroenterology;;   INTRAVASCULAR PRESSURE WIRE/FFR STUDY N/A 05/10/2020   Procedure: INTRAVASCULAR PRESSURE WIRE/FFR STUDY;  Surgeon: Sherren Mocha, MD;  Location: [facility];  Service: Cardiovascular;  Laterality: N/A;   LEFT HEART CATH AND CORS/GRAFTS ANGIOGRAPHY N/A 05/10/2020   Procedure: LEFT HEART CATH AND CORS/GRAFTS ANGIOGRAPHY;  Surgeon: Sherren Mocha, MD;   Location: [facility];  Service: Cardiovascular;  Laterality: N/A;   LIPOMA EXCISION Left 07/2008    "back of shoulder" Dr Gershon Crane   POLYPECTOMY  02/22/2020   Procedure: POLYPECTOMY;  Surgeon: Doran Stabler, MD;  Location: [facility];  Service: Gastroenterology;;   RIGHT/LEFT HEART CATH AND CORONARY ANGIOGRAPHY N/A 01/13/2018   Procedure: RIGHT/LEFT HEART CATH AND CORONARY ANGIOGRAPHY;  Surgeon: Troy Sine, MD;  Location: [facility];  Service: Cardiovascular;  Laterality: N/A;   TEE WITHOUT CARDIOVERSION N/A 03/04/2018   Procedure: TRANSESOPHAGEAL ECHOCARDIOGRAM (TEE);  Surgeon: Melrose Nakayama, MD;  Location: [facility];  Service: Open Heart Surgery;  Laterality: N/A;   TONSILLECTOMY     WISDOM TOOTH EXTRACTION       Family History: Family History  Problem Relation Age of Onset   Melanoma Father 43       deceased secondary to melanoma   Alzheimer's disease Father    Diabetes Father    Hypertension Mother        alive -60   Colon cancer Neg Hx    Esophageal cancer Neg Hx    Colon polyps Neg Hx    Stomach cancer Neg Hx  Social History: Social History   Socioeconomic History   Marital status: Married    Spouse name: Not on file   Number of children: Not on file   Years of education: Not on file   Highest education level: Not on file  Occupational History   Not on file  Tobacco Use   Smoking status: Former    Packs/day: 1.50    Years: 41.00    Pack years: 61.50    Types: Cigarettes    Quit date: 08/26/2010    Years since quitting: 10.8   Smokeless tobacco: Never   Tobacco comments:    Quit 2012  Vaping Use   Vaping Use: Never used  Substance and Sexual Activity   Alcohol use: Yes    Comment: occasionally   Drug use: Not Currently   Sexual activity: Not Currently  Other Topics Concern   Not on file  Social History Narrative   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   Married    Former Smoker -  33 pack year history   Alcohol use-no      Drug use-no             Social Determinants of Radio broadcast assistant Strain: Low Risk    Difficulty of Paying Living Expenses: Not hard at all  Food Insecurity: No Food Insecurity   Worried About Charity fundraiser in the Last Year: Never true   Arboriculturist in the Last Year: Never true  Transportation Needs: No Transportation Needs   Lack of Transportation (Medical): No   Lack of Transportation (Non-Medical): No  Physical Activity: Insufficiently Active   Days of Exercise per Week: 2 days   Minutes of Exercise per Session: 60 min  Stress: No Stress Concern Present   Feeling of Stress : Not at all  Social Connections: Moderately Integrated   Frequency of Communication with Friends and Family: More than three times a week   Frequency of Social Gatherings with Friends and Family: More than three times a week   Attends Religious Services: More than 4 times per year   Active Member of [employer] or Organizations: No   Attends Archivist Meetings: Never   Marital Status: Married    Allergies: Allergies  Allergen Reactions   Lisinopril Other (See Comments)    Hyperkalemia    Hydrochlorothiazide Hives   Amlodipine Besylate Other (See Comments)    REACTION: ? caused left axillary nodules/chest flutter   Farxiga [Dapagliflozin] Rash and Other (See Comments)    "swollen penis"    Outpatient Meds: Current Outpatient Medications  Medication Sig Dispense Refill   acetaminophen (TYLENOL) 500 MG tablet Take 1,000 mg by mouth every 6 (six) hours as needed for moderate pain or headache.     aspirin EC 325 MG EC tablet Take 1 tablet (325 mg total) by mouth daily.  0   cetirizine (ZYRTEC) 10 MG tablet Take 10 mg by mouth daily as needed for allergies.     Cyanocobalamin (VITAMIN B-12 PO) Take 1 tablet by mouth daily.     ENTRESTO 97-103 MG TAKE 1 TABLET BY MOUTH TWICE A DAY 180 tablet 3   glucose blood (PRODIGY NO CODING BLOOD GLUC) test strip Use as instructed to check  blood sugar twice a day.  DX  E11.40 100 each 1   hydrALAZINE (APRESOLINE) 25 MG tablet Take 1 tablet (25 mg total) by mouth 3 (three) times daily. 270 tablet 3   insulin aspart (NOVOLOG FLEXPEN)  100 UNIT/ML FlexPen Inject 8 Units into the skin with breakfast, with lunch, and with evening meal. 15 mL 4   insulin detemir (LEVEMIR FLEXTOUCH) 100 UNIT/ML FlexPen Inject 18 Units into the skin every morning. 15 mL 4   Insulin Pen Needle (BD PEN NEEDLE NANO 2ND GEN) 32G X 4 MM MISC Inject 1 Device into the skin in the morning, at noon, in the evening, and at bedtime. 400 each 3   isosorbide mononitrate (IMDUR) 30 MG 24 hr tablet Take 15 mg by mouth daily.     metFORMIN (GLUCOPHAGE) 1000 MG tablet Take 1 tablet (1,000 mg total) by mouth 2 (two) times daily with a meal. 180 tablet 3   metoprolol succinate (TOPROL-XL) 50 MG 24 hr tablet Take 50 mg by mouth daily.     rosuvastatin (CRESTOR) 20 MG tablet TAKE 1 TABLET BY MOUTH EVERY DAY 90 tablet 1   spironolactone (ALDACTONE) 25 MG tablet TAKE 1/2 TABLET BY MOUTH EVERY DAY 45 tablet 3   No current facility-administered medications for this visit.      ___________________________________________________________________ Objective   Exam:  BP 126/80   Pulse 80   Ht 6\' 4"  (1.93 m)   Wt 172 lb (78 kg)   BMI 20.94 kg/m  Wt Readings from Last 3 Encounters:  07/10/21 172 lb (78 kg)  06/26/21 176 lb (79.8 kg)  06/04/21 176 lb (79.8 kg)   Nml perianal exam DRE - 2 palpable soft anal verge lesions Anoscopy:  mildly hypertrophied anal papilla    Assessment: Encounter Diagnosis  Name Primary?   Anal lesion     Hypertrophied anal papilla, benign finding.  (Can also see it on retroflexion photo from most recent colonoscopy).  I reassured him, he will see me as regularly scheduled for surveillance colonoscopy.   Thank you for the courtesy of this consult.  Please call me with any questions or concerns.  [name]  CC: Referring  provider noted above

## 2021-07-10 NOTE — Telephone Encounter (Signed)
**Note De-Identified Kemiya Batdorf Obfuscation** ELPIDIO THIELEN Key: [id number] - PA Case ID: [id number] - Rx #: [id number] Archived on July 07, 2020 Outcome: Approved on July 05, 2020 Your PA request has been approved. Additional information will be provided in the approval communication. (Message 1145) Drug: Entresto 24-26MG  tablets Form [employer] Electronic PA Form [phone] NCPDP) Original Claim Info [place] REQ-MD CALL [phone].DRUG REQUIRES PRIOR AUTHORIZATION

## 2021-07-28 ENCOUNTER — Other Ambulatory Visit (HOSPITAL_COMMUNITY): Payer: Self-pay | Admitting: Internal Medicine

## 2021-07-30 ENCOUNTER — Other Ambulatory Visit: Payer: Self-pay | Admitting: *Deleted

## 2021-07-30 DIAGNOSIS — Z87891 Personal history of nicotine dependence: Secondary | ICD-10-CM

## 2021-08-02 ENCOUNTER — Telehealth (HOSPITAL_COMMUNITY): Payer: Self-pay | Admitting: Pharmacy Technician

## 2021-08-02 NOTE — Telephone Encounter (Signed)
Patient Advocate Encounter   Received notification from [place] that prior authorization for [name] is required.   PA submitted on CoverMyMeds Key [id number] Status is pending   Will continue to follow.

## 2021-08-17 ENCOUNTER — Encounter: Payer: Self-pay | Admitting: Cardiology

## 2021-08-17 NOTE — Telephone Encounter (Signed)
Advanced Heart Failure Patient Advocate Encounter  Prior Authorization for Delene Loll has been approved.    PA#  [id number] Effective dates: 08/02/21 through 08/02/22  Charlann Boxer, CPhT

## 2021-08-22 ENCOUNTER — Ambulatory Visit
Admission: RE | Admit: 2021-08-22 | Discharge: 2021-08-22 | Disposition: A | Discharge status: Home / Self Care | Payer: Medicare Other | Source: Ambulatory Visit | Attending: Acute Care | Admitting: Acute Care

## 2021-08-22 ENCOUNTER — Other Ambulatory Visit: Payer: Self-pay

## 2021-08-22 DIAGNOSIS — Z87891 Personal history of nicotine dependence: Secondary | ICD-10-CM

## 2021-08-22 MED ORDER — AMOXICILLIN 500 MG PO CAPS: 500.0000 mg | ORAL_CAPSULE | Freq: Once | ORAL | 0 refills | Status: DC

## 2021-08-24 ENCOUNTER — Other Ambulatory Visit: Payer: Self-pay | Admitting: Acute Care

## 2021-08-24 DIAGNOSIS — Z87891 Personal history of nicotine dependence: Secondary | ICD-10-CM

## 2021-08-26 ENCOUNTER — Encounter: Payer: Self-pay | Admitting: Family

## 2021-08-30 ENCOUNTER — Encounter: Payer: Self-pay | Admitting: Hematology & Oncology

## 2021-08-30 ENCOUNTER — Other Ambulatory Visit: Payer: Self-pay

## 2021-08-30 ENCOUNTER — Inpatient Hospital Stay (HOSPITAL_BASED_OUTPATIENT_CLINIC_OR_DEPARTMENT_OTHER): Payer: Medicare PPO | Admitting: Hematology & Oncology

## 2021-08-30 ENCOUNTER — Inpatient Hospital Stay: Payer: Medicare PPO

## 2021-08-30 ENCOUNTER — Inpatient Hospital Stay: Payer: Medicare PPO | Attending: Hematology & Oncology

## 2021-08-30 VITALS — BP 153/86 | HR 72 | Temp 97.9°F | Resp 17 | Wt 174.0 lb

## 2021-08-30 DIAGNOSIS — Z7982 Long term (current) use of aspirin: Secondary | ICD-10-CM | POA: Insufficient documentation

## 2021-08-30 DIAGNOSIS — Z888 Allergy status to other drugs, medicaments and biological substances status: Secondary | ICD-10-CM | POA: Diagnosis not present

## 2021-08-30 DIAGNOSIS — Z79899 Other long term (current) drug therapy: Secondary | ICD-10-CM | POA: Insufficient documentation

## 2021-08-30 DIAGNOSIS — D45 Polycythemia vera: Secondary | ICD-10-CM

## 2021-08-30 LAB — CMP (CANCER CENTER ONLY)
ALT: 9 U/L (ref 0–44)
AST: 14 U/L — ABNORMAL LOW (ref 15–41)
Albumin: 4.6 g/dL (ref 3.5–5.0)
Alkaline Phosphatase: 42 U/L (ref 38–126)
Anion gap: 9 (ref 5–15)
BUN: 12 mg/dL (ref 8–23)
CO2: 28 mmol/L (ref 22–32)
Calcium: 10.2 mg/dL (ref 8.9–10.3)
Chloride: 103 mmol/L (ref 98–111)
Creatinine: 0.89 mg/dL (ref 0.61–1.24)
GFR, Estimated: >60 mL/min (ref >60)
Glucose, Bld: 109 mg/dL — ABNORMAL HIGH (ref 70–99)
Potassium: 4.2 mmol/L (ref 3.5–5.1)
Sodium: 140 mmol/L (ref 135–145)
Total Bilirubin: 0.7 mg/dL (ref 0.3–1.2)
Total Protein: 7.5 g/dL (ref 6.5–8.1)

## 2021-08-30 LAB — LACTATE DEHYDROGENASE: LDH: 165 U/L (ref 98–192)

## 2021-08-30 LAB — CBC WITH DIFFERENTIAL (CANCER CENTER ONLY)
Abs Immature Granulocytes: 0.03 10*3/uL (ref 0.00–0.07)
Basophils Absolute: 0 10*3/uL (ref 0.0–0.1)
Basophils Relative: 1 %
Eosinophils Absolute: 0.1 10*3/uL (ref 0.0–0.5)
Eosinophils Relative: 1 %
HCT: 43.6 % (ref 39.0–52.0)
Hemoglobin: 12.8 g/dL — ABNORMAL LOW (ref 13.0–17.0)
Immature Granulocytes: 0 %
Lymphocytes Relative: 25 %
Lymphs Abs: 1.7 10*3/uL (ref 0.7–4.0)
MCH: 18.1 pg — ABNORMAL LOW (ref 26.0–34.0)
MCHC: 29.4 g/dL — ABNORMAL LOW (ref 30.0–36.0)
MCV: 61.8 fL — ABNORMAL LOW (ref 80.0–100.0)
Monocytes Absolute: 0.8 10*3/uL (ref 0.1–1.0)
Monocytes Relative: 11 %
Neutro Abs: 4.2 10*3/uL (ref 1.7–7.7)
Neutrophils Relative %: 62 %
Platelet Count: 377 10*3/uL (ref 150–400)
RBC: 7.06 MIL/uL — ABNORMAL HIGH (ref 4.22–5.81)
RDW: 23.9 % — ABNORMAL HIGH (ref 11.5–15.5)
WBC Count: 6.8 10*3/uL (ref 4.0–10.5)
nRBC: 0 % (ref 0.0–0.2)

## 2021-08-30 LAB — RETICULOCYTES
Immature Retic Fract: 24.3 % — ABNORMAL HIGH (ref 2.3–15.9)
RBC.: 6.98 MIL/uL — ABNORMAL HIGH (ref 4.22–5.81)
Retic Count, Absolute: 75.4 10*3/uL (ref 19.0–186.0)
Retic Ct Pct: 1.1 % (ref 0.4–3.1)

## 2021-08-30 LAB — SAVE SMEAR(SSMR), FOR PROVIDER SLIDE REVIEW

## 2021-08-30 NOTE — Progress Notes (Signed)
Hematology and Oncology Follow Up Visit  [name] [id number] [birth year] 71 y.o. 08/30/2021   Principle Diagnosis:  Polycythemia vera- JAK2 (+)  Current Therapy:   Phlebotomy to maintain hematocrit less than 45% Aspirin 325 mg p.o. daily-on full dose for cardiac issues     Interim History:  [name] is back for follow-up.  We last saw him back in August.  Since then, he has been doing quite well.  He has had no problems.  He had no problems over the holiday season.  He has had no issues with nausea or vomiting.  He has had no headache.  He has had no cough or shortness of breath.  He has had no change in bowel or bladder habits.  His iron studies that we checked on him back in August showed a ferritin that was less than 4 and saturation was 7%.  He has had no rashes.  There is been no itching.  Overall, his performance status is ECOG 0.    Medications:  Current Outpatient Medications:    acetaminophen (TYLENOL) 500 MG tablet, Take 1,000 mg by mouth every 6 (six) hours as needed for moderate pain or headache., Disp: , Rfl:    aspirin EC 325 MG EC tablet, Take 1 tablet (325 mg total) by mouth daily., Disp: , Rfl: 0   cetirizine (ZYRTEC) 10 MG tablet, Take 10 mg by mouth daily as needed for allergies., Disp: , Rfl:    Cyanocobalamin (VITAMIN B-12 PO), Take 1 tablet by mouth daily., Disp: , Rfl:    ENTRESTO 97-103 MG, TAKE 1 TABLET BY MOUTH TWICE A DAY, Disp: 180 tablet, Rfl: 3   glucose blood (PRODIGY NO CODING BLOOD GLUC) test strip, Use as instructed to check blood sugar twice a day.  DX  E11.40, Disp: 100 each, Rfl: 1   hydrALAZINE (APRESOLINE) 25 MG tablet, Take 1 tablet (25 mg total) by mouth 3 (three) times daily., Disp: 270 tablet, Rfl: 3   insulin aspart (NOVOLOG FLEXPEN) 100 UNIT/ML FlexPen, Inject 8 Units into the skin with breakfast, with lunch, and with evening meal., Disp: 15 mL, Rfl: 4   insulin detemir (LEVEMIR FLEXTOUCH) 100 UNIT/ML FlexPen, Inject 18  Units into the skin every morning., Disp: 15 mL, Rfl: 4   Insulin Pen Needle (BD PEN NEEDLE NANO 2ND GEN) 32G X 4 MM MISC, Inject 1 Device into the skin in the morning, at noon, in the evening, and at bedtime., Disp: 400 each, Rfl: 3   isosorbide mononitrate (IMDUR) 30 MG 24 hr tablet, Take 15 mg by mouth daily., Disp: , Rfl:    metFORMIN (GLUCOPHAGE) 1000 MG tablet, Take 1 tablet (1,000 mg total) by mouth 2 (two) times daily with a meal., Disp: 180 tablet, Rfl: 3   metoprolol succinate (TOPROL-XL) 50 MG 24 hr tablet, TAKE 1 TABLET BY MOUTH DAILY. TAKE WITH OR IMMEDIATELY FOLLOWING A MEAL., Disp: 90 tablet, Rfl: 3   rosuvastatin (CRESTOR) 20 MG tablet, TAKE 1 TABLET BY MOUTH EVERY DAY, Disp: 90 tablet, Rfl: 1   spironolactone (ALDACTONE) 25 MG tablet, TAKE 1/2 TABLET BY MOUTH EVERY DAY, Disp: 45 tablet, Rfl: 3  Allergies:  Allergies  Allergen Reactions   Lisinopril Other (See Comments)    Hyperkalemia    Hydrochlorothiazide Hives   Amlodipine Besylate Other (See Comments)    REACTION: ? caused left axillary nodules/chest flutter   Farxiga [Dapagliflozin] Rash and Other (See Comments)    "swollen penis"    Past Medical History, Surgical  history, Social history, and Family History were reviewed and updated.  Review of Systems: Review of Systems  Constitutional: Negative.   HENT:  Negative.    Eyes: Negative.   Respiratory: Negative.    Cardiovascular: Negative.   Gastrointestinal: Negative.   Endocrine: Negative.   Genitourinary: Negative.    Musculoskeletal: Negative.   Skin: Negative.   Neurological: Negative.   Hematological: Negative.   Psychiatric/Behavioral: Negative.     Physical Exam:  weight is 174 lb (78.9 kg). His oral temperature is 97.9 F (36.6 C). His blood pressure is 153/86 (abnormal) and his pulse is 72. His respiration is 17 and oxygen saturation is 95%.   Wt Readings from Last 3 Encounters:  08/30/21 174 lb (78.9 kg)  07/10/21 172 lb (78 kg)  06/26/21  176 lb (79.8 kg)    Physical Exam Vitals reviewed.  HENT:     Head: Normocephalic and atraumatic.  Eyes:     Pupils: Pupils are equal, round, and reactive to light.  Cardiovascular:     Rate and Rhythm: Normal rate and regular rhythm.     Heart sounds: Normal heart sounds.  Pulmonary:     Effort: Pulmonary effort is normal.     Breath sounds: Normal breath sounds.  Abdominal:     General: Bowel sounds are normal.     Palpations: Abdomen is soft.  Musculoskeletal:        General: No tenderness or deformity. Normal range of motion.     Cervical back: Normal range of motion.  Lymphadenopathy:     Cervical: No cervical adenopathy.  Skin:    General: Skin is warm and dry.     Findings: No erythema or rash.  Neurological:     Mental Status: He is alert and oriented to person, place, and time.  Psychiatric:        Behavior: Behavior normal.        Thought Content: Thought content normal.        Judgment: Judgment normal.   Lab Results  Component Value Date   WBC 6.8 08/30/2021   HGB 12.8 (L) 08/30/2021   HCT 43.6 08/30/2021   MCV 61.8 (L) 08/30/2021   PLT 377 08/30/2021     Chemistry      Component Value Date/Time   NA 140 08/30/2021 1145   NA 140 05/30/2021 0951   K 4.2 08/30/2021 1145   CL 103 08/30/2021 1145   CO2 28 08/30/2021 1145   BUN 12 08/30/2021 1145   BUN 10 05/30/2021 0951   CREATININE 0.89 08/30/2021 1145   CREATININE 0.84 11/30/2013 0856      Component Value Date/Time   CALCIUM 10.2 08/30/2021 1145   ALKPHOS 42 08/30/2021 1145   AST 14 (L) 08/30/2021 1145   ALT 9 08/30/2021 1145   BILITOT 0.7 08/30/2021 1145      Impression and Plan: [name] is a very nice 71 year old African-American male.  He has polycythemia vera.  This is by virtue of him having the positive JAK2 assay.  I had to suspect that he is probably iron deficient because of the polycythemia.  He is on full dose aspirin because of his cardiac issues.  We do not need to  phlebotomize him today.  I am very happy about this.  We will plan to get her back in another 4 months.  We will get him through the Winter.    Volanda Napoleon, MD 1/5/20231:34 PM

## 2021-08-31 LAB — IRON AND IRON BINDING CAPACITY (CC-WL,HP ONLY)
Iron: 31 ug/dL — ABNORMAL LOW (ref 45–182)
Saturation Ratios: 7 % — ABNORMAL LOW (ref 17.9–39.5)
TIBC: 469 ug/dL — ABNORMAL HIGH (ref 250–450)
UIBC: 438 ug/dL — ABNORMAL HIGH (ref 117–376)

## 2021-08-31 LAB — FERRITIN: Ferritin: 8 ng/mL — ABNORMAL LOW (ref 24–336)

## 2021-09-17 ENCOUNTER — Encounter: Payer: Self-pay | Admitting: Internal Medicine

## 2021-09-17 LAB — HM DIABETES EYE EXAM

## 2021-09-29 ENCOUNTER — Other Ambulatory Visit: Payer: Self-pay | Admitting: Internal Medicine

## 2021-10-15 ENCOUNTER — Encounter: Payer: Self-pay | Admitting: Cardiology

## 2021-10-15 NOTE — Telephone Encounter (Signed)
Spoke to patient .  Appointment given for 11/12/21 at 9:20 am  Patient aware.  Appointment will populate in [place]. Patient verbalized understanding

## 2021-10-29 ENCOUNTER — Encounter: Payer: Self-pay | Admitting: Cardiology

## 2021-10-29 MED ORDER — AMOXICILLIN 500 MG PO TABS
500.0000 mg | ORAL_TABLET | Freq: Once | ORAL | 0 refills | Status: DC
Start: 2021-10-29 — End: 2022-04-02

## 2021-11-12 ENCOUNTER — Encounter: Payer: Self-pay | Admitting: Cardiology

## 2021-11-12 ENCOUNTER — Other Ambulatory Visit: Payer: Self-pay

## 2021-11-12 ENCOUNTER — Ambulatory Visit: Payer: Medicare PPO | Admitting: Cardiology

## 2021-11-12 VITALS — BP 132/74 | HR 82 | Ht 76.0 in | Wt 173.8 lb

## 2021-11-12 DIAGNOSIS — Z79899 Other long term (current) drug therapy: Secondary | ICD-10-CM | POA: Diagnosis not present

## 2021-11-12 DIAGNOSIS — R931 Abnormal findings on diagnostic imaging of heart and coronary circulation: Secondary | ICD-10-CM | POA: Insufficient documentation

## 2021-11-12 DIAGNOSIS — I10 Essential (primary) hypertension: Secondary | ICD-10-CM

## 2021-11-12 DIAGNOSIS — Z952 Presence of prosthetic heart valve: Secondary | ICD-10-CM

## 2021-11-12 DIAGNOSIS — R0989 Other specified symptoms and signs involving the circulatory and respiratory systems: Secondary | ICD-10-CM | POA: Insufficient documentation

## 2021-11-12 DIAGNOSIS — I2510 Atherosclerotic heart disease of native coronary artery without angina pectoris: Secondary | ICD-10-CM

## 2021-11-12 DIAGNOSIS — E785 Hyperlipidemia, unspecified: Secondary | ICD-10-CM

## 2021-11-12 DIAGNOSIS — I480 Paroxysmal atrial fibrillation: Secondary | ICD-10-CM

## 2021-11-12 NOTE — Progress Notes (Signed)
?Cardiology Office Note:   ? ?Date:  11/12/2021  ? ?ID:  [name], DOB 1951/01/07, MRN [id number] ? ?PCP:  Colon Branch, MD  ?Cardiologist:  Berniece Salines, DO  ?Electrophysiologist:  None  ? ?Referring MD: Colon Branch, MD  ? ? ? ?History of Present Illness:   ? ?[name] is a 71 y.o. male with a hx of  coronary artery disease status post CABG in 2019, ischemic cardiomyopathy most recent EF on March 27, 2020 at which time his EF was reported to be 25 to 30%, Status post aortic valve replacement with a 23 Edwards valve also in July 2019, hypertension hyperlipidemia and diabetes.  Polycythemia vera and is undergoing phlebotomy. ?  ?I saw the patient on April 05, 2020 at that time we kept him on his losartan as well as his beta-blocker and added Aldactone.  ?  ?When I saw the patient 1 May 05, 2020 he still with experiencing significant fatigue and shortness of breath.  Given his history of  CAD I recommended he undergo a left heart catheterization to make sure that worsening/progression of CAD was not causing symptoms.  He did undergo left heart catheterization there is no need for a stent placement. ?  ?I saw the patient on August 14, 2020 at that time we will continue his beta-blocker, increase his Entresto to 49-51 twice daily, continue Aldactone 12.5, Hydralazine 10 mg 3 times daily and Farxiga 10 mg daily.  We discussed his cardiac MRI which show EF of 25%. ?  ?I saw the patient in March 2020 we discussed his left heart catheterization results.  With a concern for we will continue his medical therapy and was pending his echocardiogram. ? ?His last visit was May 30, 2021 at that time he was hypertensive so I increase his hydralazine to 25 mg 3 times a day.  We talked about his improved EF. ? ?The patient is here today for follow-up visit.  He offers no complaints at this time. ? ?Past Medical History:  ?Diagnosis Date  ? Abnormal myocardial perfusion study 2006  ? EF 44% ? inferoseptal  ischemia. no cath done  ? Benign neoplasm of cecum   ? Breast mass, right 03/2008  ? Cardiomyopathy ([place]) 07/20/2018  ? Ejection fraction 4045% in September 2019  ? Centrilobular emphysema ([place]) 08/25/2018  ? 02/04/2018-high-res CT- no evidence of interstitial lung disease, severe centrilobular emphysema and mild diffuse bronchial wall thickening suggesting COPD, no calcified pleural plaques no pleural effusion, mild smooth bilateral pleural thickening which is nonspecific  02/04/2018-pulmonary function test- FVC 3.37 (71% predicted), postbronchodilator ratio 50, postbronchodilator FEV1 54, mid flow reve  ? CHF (congestive heart failure) ([place])   ? COLONIC POLYPS, HX OF 03/18/2007  ? Qualifier: Diagnosis of  By: [name]   ? Contact dermatitis 01/31/2013  ? COPD (chronic obstructive pulmonary disease) ([place])   ? "dx'd 12/2017"  ? COPD GOLD II A  08/25/2018  ? 02/04/2018-high-res CT- no evidence of interstitial lung disease, severe centrilobular emphysema and mild diffuse bronchial wall thickening suggesting COPD, no calcified pleural plaques no pleural effusion, mild smooth bilateral pleural thickening which is nonspecific  02/04/2018-pulmonary function test- FVC 3.37 (71% predicted), postbronchodilator ratio 50, postbronchodilator FEV1 54, mid flow reve  ? Coronary artery disease   ? Critical aortic valve stenosis 01/12/2018  ? Calculated valve area less than 0.5 cm? mean gradient more than 60 mm of mercury  ? DEGENERATIVE JOINT DISEASE, BACK 03/18/2007  ?  Qualifier: Diagnosis of  By: [name]   ? Depressed left ventricular ejection fraction 03/28/2020  ? Diabetes mellitus ([place]) 07/25/2020  ? Dyslipidemia 04/20/2020  ? Dyspnea on exertion 01/12/2018  ? ERECTILE DYSFUNCTION, ORGANIC 02/10/2009  ? Qualifier: Diagnosis of  By: [name]   ? Former cigarette smoker 10/12/2018  ? Quit in 2012 41-pack-year smoking history  ? Goals of care, counseling/discussion 08/24/2020  ? H/O aortic valve replacement 05/05/2020   ? Heart murmur   ? HEMORRHOIDS, INTERNAL 03/18/2007  ? Qualifier: Diagnosis of  By: [name]   ? HTN (hypertension) 10/21/2007  ?     ? Hyperlipemia 10/21/2007  ? Qualifier: Diagnosis of  By: [name]   ? Hypertension   ? Hypoglycemia due to insulin 03/26/2020  ? Low back pain   ? MYOCARDIAL PERFUSION SCAN, WITH STRESS TEST, ABNORMAL 08/11/2008  ? Qualifier: Diagnosis of  By: Johnsie Cancel, MD, Rona Ravens   ? Nocturnal hypoxemia 08/25/2018  ? 04/09/18 >>> ONO - telephone note>>> Patient's ONO on room air showed that he was less than 88% for more than an hour. Patient needs to be on 2 L at HS.  10/06/2018-overnight oximetry-entire duration was 4 hours and 23 minutes, SPO2 less than 88% for 25 minutes and 32 seconds    ? Oxygen deficiency   ? uses 2L at night  ? Paroxysmal atrial fibrillation ([place]) 10/12/2018  ? PCP NOTES >>>>>>>>>>>>>>>>> 05/14/2020  ? Polycythemia vera ([place]) 08/04/2020  ? S/P AVR 03/04/2018  ? AVR 23 mm Edwards Magna Valve - bioprostetic 03/04/2018 LIMA - LAD  ? Status post coronary artery bypass graft 03/20/2018  ? LIMA to LAD during aortic valve replacement surgery 03/04/2018  ? Type 2 diabetes mellitus with hyperglycemia, with long-term current use of insulin ([place]) 04/20/2020  ? Type 2 diabetes mellitus, uncontrolled, with neuropathy 05/20/2008  ? Qualifier: Diagnosis of  By: [name]   ? ? ?Past Surgical History:  ?Procedure Laterality Date  ? ABDOMINAL AORTOGRAM N/A 01/13/2018  ? Procedure: ABDOMINAL AORTOGRAM;  Surgeon: Troy Sine, MD;  Location: [facility];  Service: Cardiovascular;  Laterality: N/A;  ? AORTIC VALVE REPLACEMENT N/A 03/04/2018  ? Procedure: AORTIC VALVE REPLACEMENT (AVR) 109m Edwards Magna Ease Tissue Valve.;  Surgeon: HMelrose Nakayama MD;  [place] [facility]  Service: Open Heart Surgery;  Laterality: N/A;  ? CARDIAC VALVE REPLACEMENT    ? COLONOSCOPY W/ POLYPECTOMY    ? COLONOSCOPY WITH PROPOFOL N/A 02/22/2020  ? Procedure: COLONOSCOPY WITH  PROPOFOL;  Surgeon: DDoran Stabler MD;  Location: [facility];  Service: Gastroenterology;  Laterality: N/A;  ? CORONARY ARTERY BYPASS GRAFT N/A 03/04/2018  ? Procedure: CORONARY ARTERY BYPASS GRAFTING (CABG) x1:  LIMA to LAD.;  Surgeon: HMelrose Nakayama MD;  [place] [facility]  Service: Open Heart Surgery;  Laterality: N/A;  ? HEMOSTASIS CLIP PLACEMENT  02/22/2020  ? Procedure: HEMOSTASIS CLIP PLACEMENT;  Surgeon: DDoran Stabler MD;  [facility];  Service: Gastroenterology;;  ? INTRAVASCULAR PRESSURE WIRE/FFR STUDY N/A 05/10/2020  ? Procedure: INTRAVASCULAR PRESSURE WIRE/FFR STUDY;  Surgeon: CSherren Mocha MD;  [facility];  Service: Cardiovascular;  Laterality: N/A;  ? LEFT HEART CATH AND CORS/GRAFTS ANGIOGRAPHY N/A 05/10/2020  ? Procedure: LEFT HEART CATH AND CORS/GRAFTS ANGIOGRAPHY;  Surgeon: CSherren Mocha MD;  [facility];  Service: Cardiovascular;  Laterality: N/A;  ? LIPOMA EXCISION Left 07/2008  ?  "  back of shoulder" Dr Gershon Crane  ? POLYPECTOMY  02/22/2020  ? Procedure: POLYPECTOMY;  Surgeon: Doran Stabler, MD;  [facility];  Service: Gastroenterology;;  ? RIGHT/LEFT HEART CATH AND CORONARY ANGIOGRAPHY N/A 01/13/2018  ? Procedure: RIGHT/LEFT HEART CATH AND CORONARY ANGIOGRAPHY;  Surgeon: Troy Sine, MD;  Location: [facility];  Service: Cardiovascular;  Laterality: N/A;  ? TEE WITHOUT CARDIOVERSION N/A 03/04/2018  ? Procedure: TRANSESOPHAGEAL ECHOCARDIOGRAM (TEE);  Surgeon: Melrose Nakayama, MD;  Location: [facility];  Service: Open Heart Surgery;  Laterality: N/A;  ? TONSILLECTOMY    ? WISDOM TOOTH EXTRACTION    ? ? ?Current Medications: ?Current Meds  ?Medication Sig  ? acetaminophen (TYLENOL) 500 MG tablet Take 1,000 mg by mouth every 6 (six) hours as needed for moderate pain or headache.  ? aspirin EC 325 MG EC tablet Take 1 tablet (325 mg total) by mouth daily.  ? cetirizine (ZYRTEC) 10 MG tablet Take 10 mg by mouth daily  as needed for allergies.  ? Cyanocobalamin (VITAMIN B-12 PO) Take 1 tablet by mouth daily.  ? ENTRESTO 97-103 MG TAKE 1 TABLET BY MOUTH TWICE A DAY  ? glucose blood (PRODIGY NO CODING BLOOD GLUC) test strip

## 2021-11-12 NOTE — Patient Instructions (Signed)
Medication Instructions:  ?Your physician recommends that you continue on your current medications as directed. Please refer to the Current Medication list given to you today.  ?*If you need a refill on your cardiac medications before your next appointment, please call your pharmacy* ? ? ?Lab Work: ?Your physician recommends that you return for lab work in:  ?TODAY: BMET, [place] ?If you have labs (blood work) drawn today and your tests are completely normal, you will receive your results only by: ?MyChart Message (if you have MyChart) OR ?A paper copy in the mail ?If you have any lab test that is abnormal or we need to change your treatment, we will call you to review the results. ? ? ?Testing/Procedures: ?None ? ? ?Follow-Up: ?At [facility], you and your health needs are our priority.  As part of our continuing mission to provide you with exceptional heart care, we have created designated Provider Care Teams.  These Care Teams include your primary Cardiologist (physician) and Advanced Practice Providers (APPs -  Physician Assistants and Nurse Practitioners) who all work together to provide you with the care you need, when you need it. ? ?We recommend signing up for the patient portal called "MyChart".  Sign up information is provided on this After Visit Summary.  MyChart is used to connect with patients for Virtual Visits (Telemedicine).  Patients are able to view lab/test results, encounter notes, upcoming appointments, etc.  Non-urgent messages can be sent to your provider as well.   ?To learn more about what you can do with MyChart, go to [email or web address].   ? ?Your next appointment:   ?6 month(s) ? ?The format for your next appointment:   ?In Person ? ?Provider:   ?Berniece Salines, DO   ? ? ?Other Instructions ?  ?

## 2021-11-13 LAB — BASIC METABOLIC PANEL
BUN/Creatinine Ratio: 14 (ref 10–24)
BUN: 12 mg/dL (ref 8–27)
CO2: 22 mmol/L (ref 20–29)
Calcium: 9.2 mg/dL (ref 8.6–10.2)
Chloride: 103 mmol/L (ref 96–106)
Creatinine, Ser: 0.88 mg/dL (ref 0.76–1.27)
Glucose: 96 mg/dL (ref 70–99)
Potassium: 4.8 mmol/L (ref 3.5–5.2)
Sodium: 141 mmol/L (ref 134–144)
eGFR: 93 mL/min/{1.73_m2} (ref >59)

## 2021-11-13 LAB — MAGNESIUM: Magnesium: 1.9 mg/dL (ref 1.6–2.3)

## 2021-11-15 IMAGING — US US ABDOMEN COMPLETE
1 series · 14 of 25 positions shown · non-contrast
Comparison: Abdominal ultrasound 03/29/2009.

CLINICAL DATA: Polycythemia [place].

EXAM:
ABDOMEN ULTRASOUND COMPLETE

[Series 1: us abdomen complete · 14 of 77 slices shown]
[im 1/77]
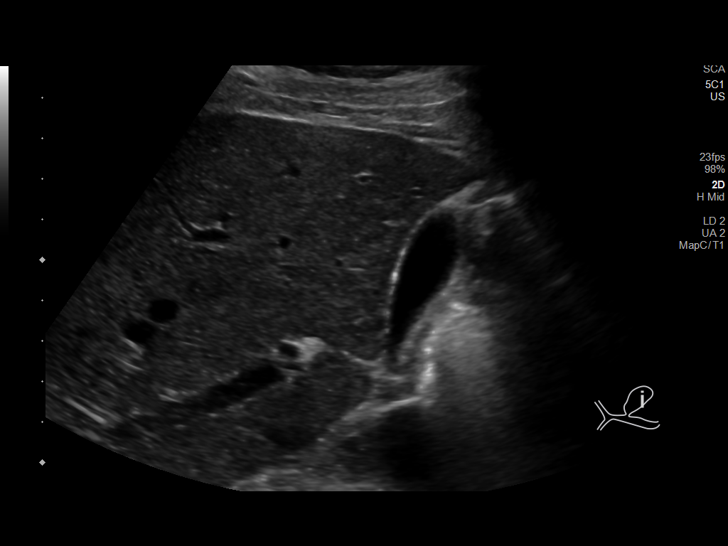
[im 7/77]
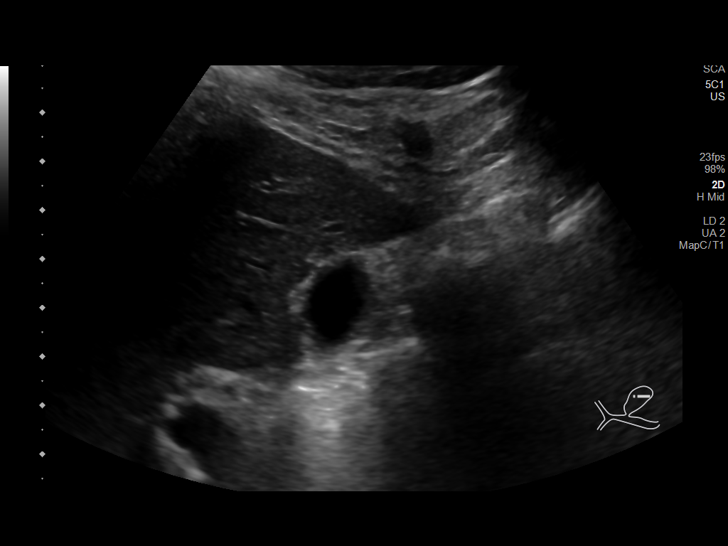
[im 13/77]
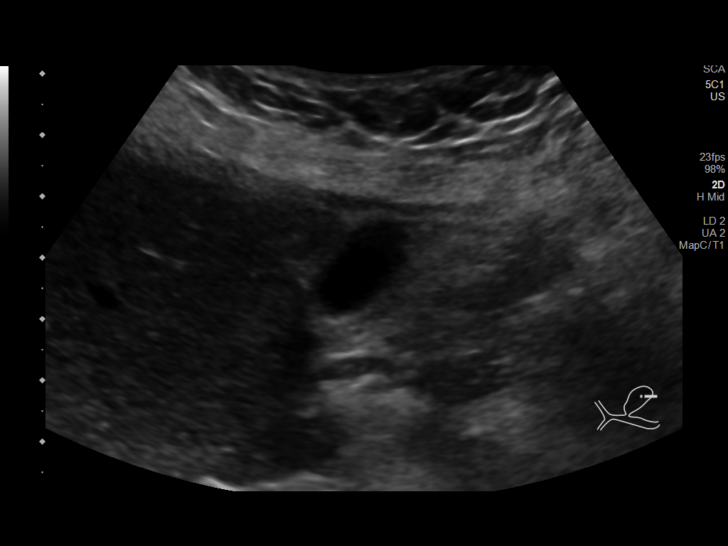
[im 20/77]
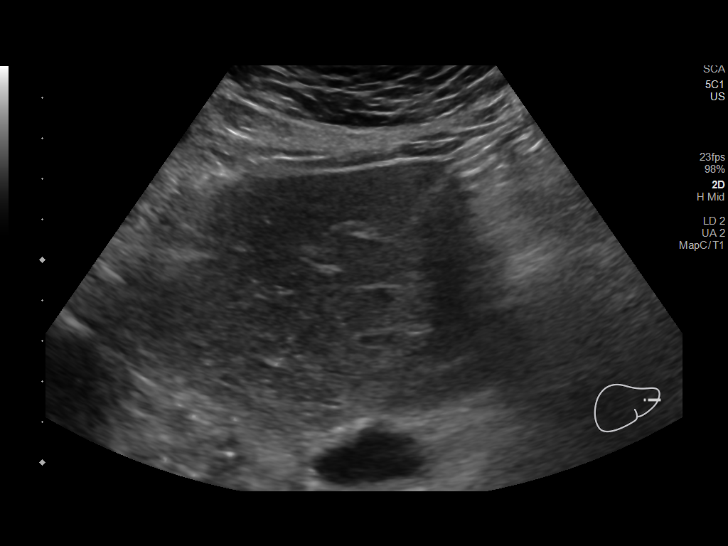
[im 26/77]
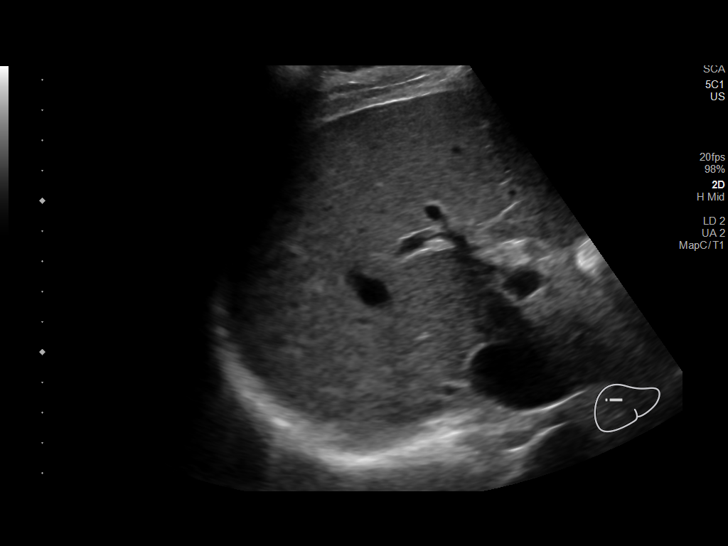
[im 29/77]
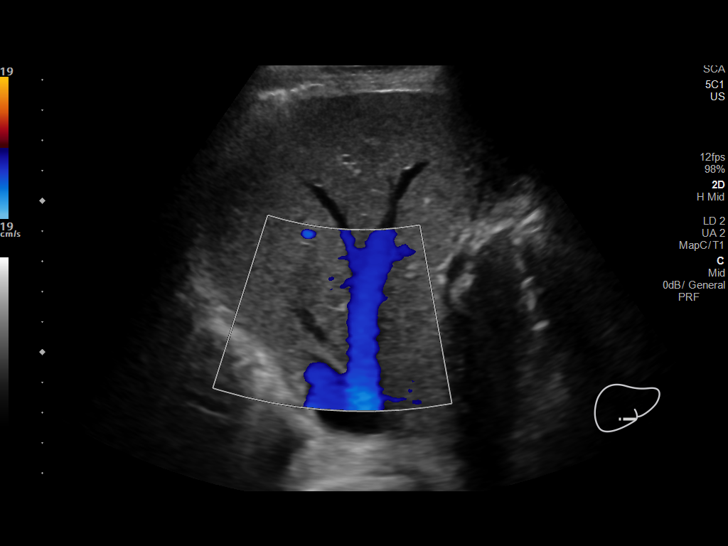
[im 35/77]
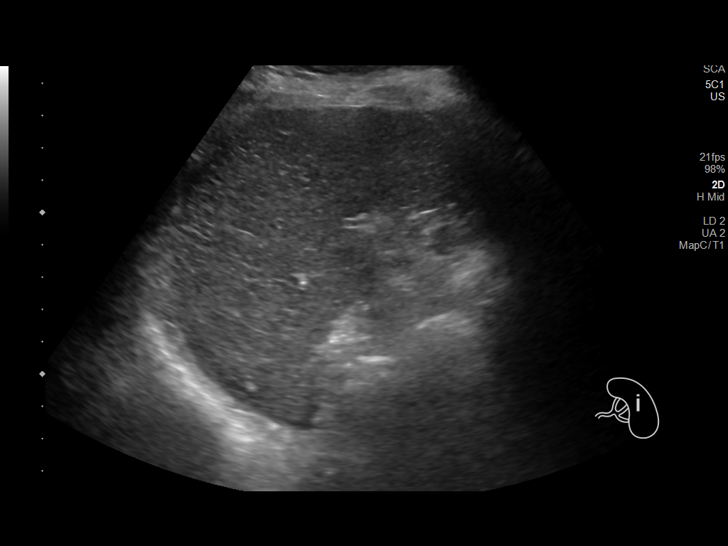
[im 42/77]
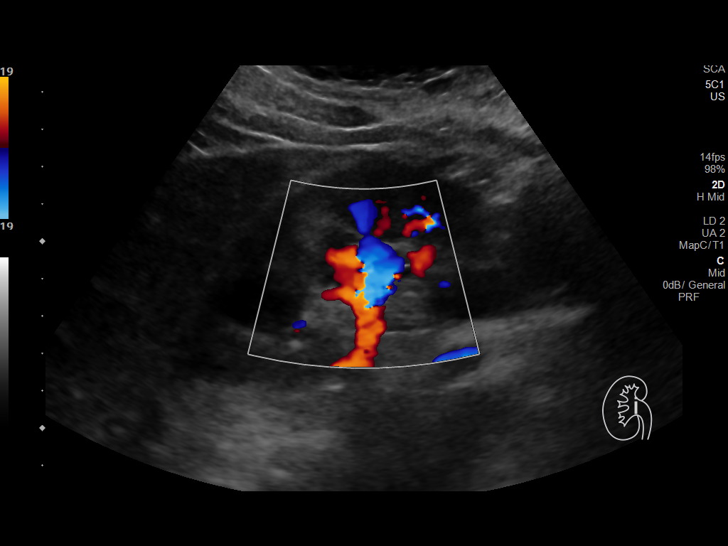
[im 48/77]
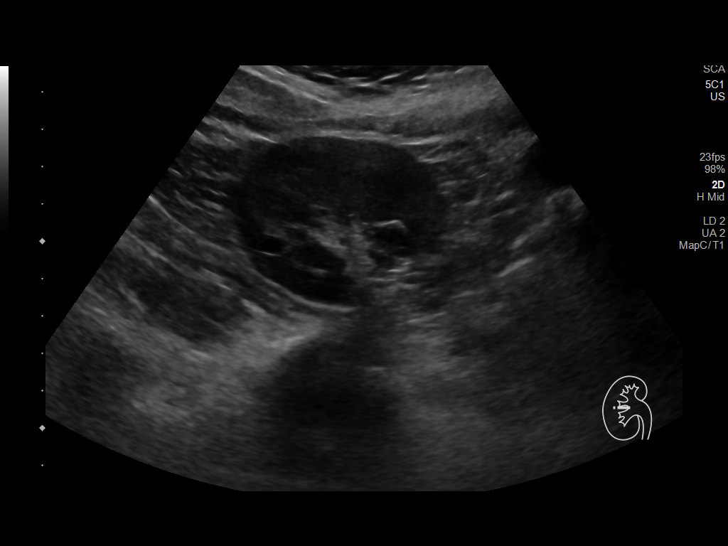
[im 51/77]
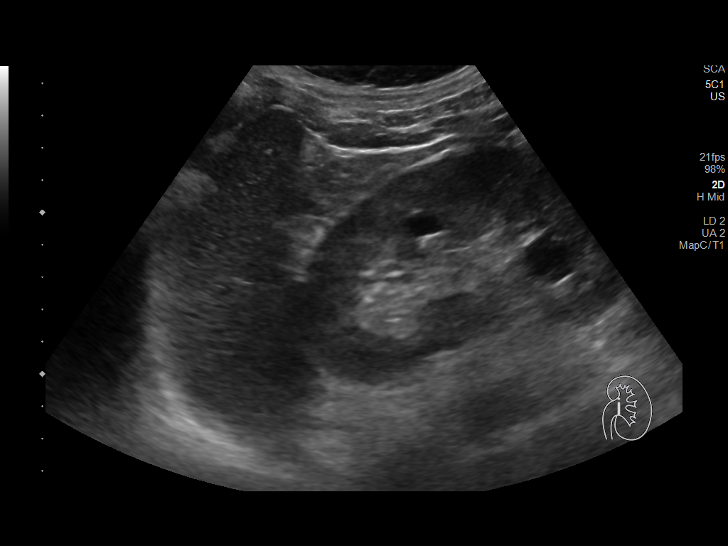
[im 58/77]
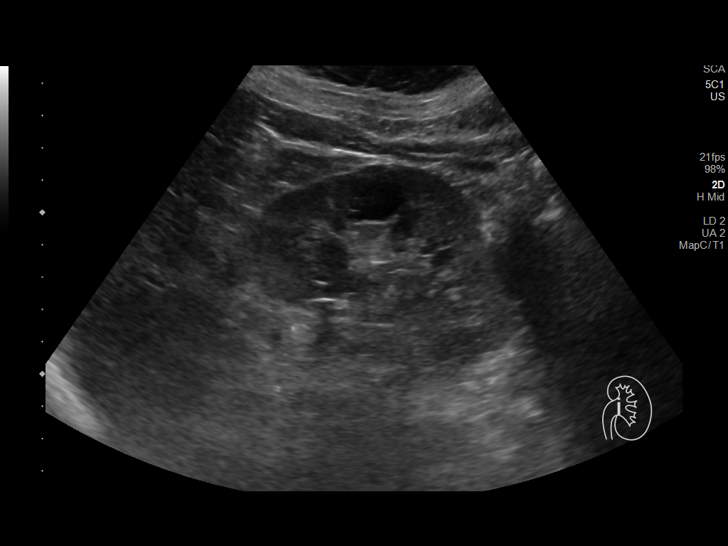
[im 64/77]
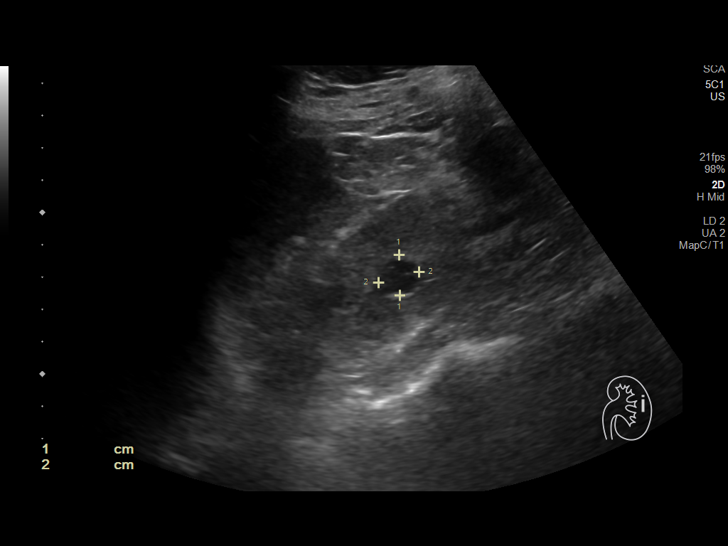
[im 70/77]
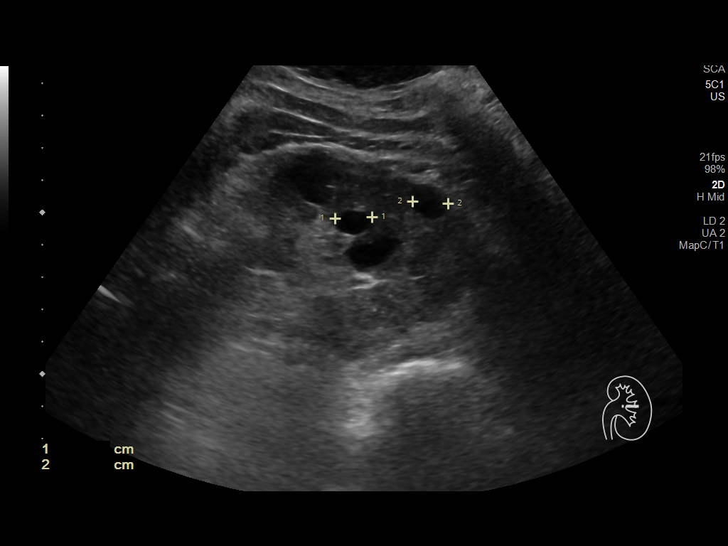
[im 77/77]
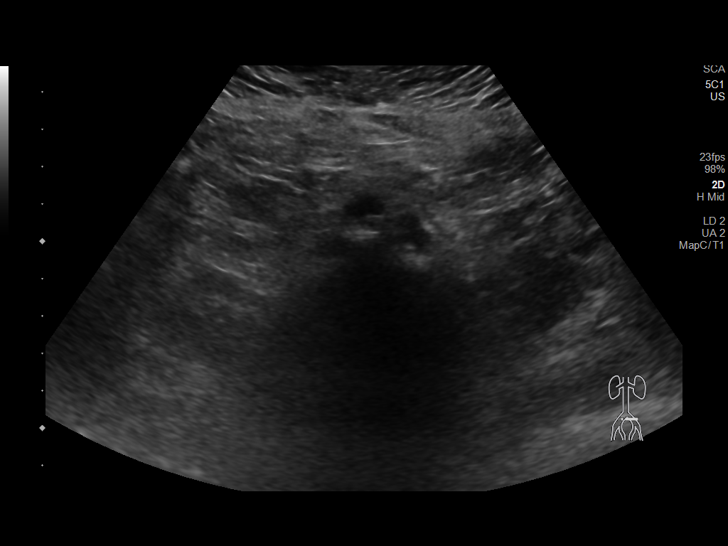

[14 of 25 positions shown; findings below may reference images not displayed]

FINDINGS: Gallbladder: No gallstones or wall thickening visualized. No
sonographic Murphy sign noted by sonographer.

Common bile duct: Diameter: 0.2 cm

Liver: No focal lesion identified. Within normal limits in
parenchymal echogenicity. Portal vein is patent on color Doppler
imaging with normal direction of blood flow towards the liver.

IVC: No abnormality visualized.

Pancreas: Visualized portion unremarkable.

Spleen: Size and appearance within normal limits. Spleen volume is
337 cm cubed.

Right Kidney: Length: 10.4 cm. Echogenicity within normal limits. No
solid mass or hydronephrosis visualized. 2.1 cm simple cyst in the
upper pole noted.

Left Kidney: Length: 11 cm. Echogenicity within normal limits. No
solid mass or hydronephrosis visualized. The patient has multiple
small cysts. Two of the cysts have single thin septations.

Abdominal aorta: No aneurysm visualized.

Other findings: None.
IMPRESSION: Negative for splenomegaly.  No acute abnormality.

Bilateral renal cysts noted.

## 2021-11-27 ENCOUNTER — Other Ambulatory Visit: Payer: Self-pay | Admitting: Cardiology

## 2021-11-28 ENCOUNTER — Inpatient Hospital Stay: Payer: Medicare PPO | Attending: Hematology & Oncology

## 2021-11-28 ENCOUNTER — Inpatient Hospital Stay: Payer: Medicare PPO

## 2021-11-28 ENCOUNTER — Encounter: Payer: Self-pay | Admitting: Family

## 2021-11-28 ENCOUNTER — Inpatient Hospital Stay: Payer: Medicare PPO | Admitting: Family

## 2021-11-28 VITALS — BP 137/88 | HR 80 | Temp 98.5°F | Resp 18 | Wt 172.0 lb

## 2021-11-28 DIAGNOSIS — D45 Polycythemia vera: Secondary | ICD-10-CM

## 2021-11-28 DIAGNOSIS — D500 Iron deficiency anemia secondary to blood loss (chronic): Secondary | ICD-10-CM

## 2021-11-28 LAB — CMP (CANCER CENTER ONLY)
ALT: 7 U/L (ref 0–44)
AST: 14 U/L — ABNORMAL LOW (ref 15–41)
Albumin: 4.4 g/dL (ref 3.5–5.0)
Alkaline Phosphatase: 42 U/L (ref 38–126)
Anion gap: 7 (ref 5–15)
BUN: 12 mg/dL (ref 8–23)
CO2: 29 mmol/L (ref 22–32)
Calcium: 9.8 mg/dL (ref 8.9–10.3)
Chloride: 104 mmol/L (ref 98–111)
Creatinine: 0.97 mg/dL (ref 0.61–1.24)
GFR, Estimated: >60 mL/min (ref >60)
Glucose, Bld: 108 mg/dL — ABNORMAL HIGH (ref 70–99)
Potassium: 4.3 mmol/L (ref 3.5–5.1)
Sodium: 140 mmol/L (ref 135–145)
Total Bilirubin: 0.5 mg/dL (ref 0.3–1.2)
Total Protein: 7.2 g/dL (ref 6.5–8.1)

## 2021-11-28 LAB — CBC WITH DIFFERENTIAL (CANCER CENTER ONLY)
Abs Immature Granulocytes: 0.05 10*3/uL (ref 0.00–0.07)
Basophils Absolute: 0.1 10*3/uL (ref 0.0–0.1)
Basophils Relative: 1 %
Eosinophils Absolute: 0.2 10*3/uL (ref 0.0–0.5)
Eosinophils Relative: 2 %
HCT: 42.8 % (ref 39.0–52.0)
Hemoglobin: 12.5 g/dL — ABNORMAL LOW (ref 13.0–17.0)
Immature Granulocytes: 1 %
Lymphocytes Relative: 29 %
Lymphs Abs: 2 10*3/uL (ref 0.7–4.0)
MCH: 18.2 pg — ABNORMAL LOW (ref 26.0–34.0)
MCHC: 29.2 g/dL — ABNORMAL LOW (ref 30.0–36.0)
MCV: 62.2 fL — ABNORMAL LOW (ref 80.0–100.0)
Monocytes Absolute: 0.8 10*3/uL (ref 0.1–1.0)
Monocytes Relative: 11 %
Neutro Abs: 3.8 10*3/uL (ref 1.7–7.7)
Neutrophils Relative %: 56 %
Platelet Count: 365 10*3/uL (ref 150–400)
RBC: 6.88 MIL/uL — ABNORMAL HIGH (ref 4.22–5.81)
RDW: 23.2 % — ABNORMAL HIGH (ref 11.5–15.5)
WBC Count: 6.7 10*3/uL (ref 4.0–10.5)
nRBC: 0 % (ref 0.0–0.2)

## 2021-11-28 LAB — IRON AND IRON BINDING CAPACITY (CC-WL,HP ONLY)
Iron: 29 ug/dL — ABNORMAL LOW (ref 45–182)
Saturation Ratios: 6 % — ABNORMAL LOW (ref 17.9–39.5)
TIBC: 461 ug/dL — ABNORMAL HIGH (ref 250–450)
UIBC: 432 ug/dL — ABNORMAL HIGH (ref 117–376)

## 2021-11-28 LAB — LACTATE DEHYDROGENASE: LDH: 149 U/L (ref 98–192)

## 2021-11-28 LAB — FERRITIN: Ferritin: 8 ng/mL — ABNORMAL LOW (ref 24–336)

## 2021-11-28 NOTE — Progress Notes (Signed)
?Hematology and Oncology Follow Up Visit ? ?[name] ?[id number] ?1951-02-01 71 y.o. ?11/28/2021 ? ? ?Principle Diagnosis:  ?Polycythemia vera- JAK2 (+) ?  ?Current Therapy:        ?Phlebotomy to maintain hematocrit less than 45% ?Aspirin 325 mg p.o. daily-on full dose for cardiac issues ?  ?Interim History:  Mr. Shearer is here today for follow-up. He is doing quite well and has no complaints at this time.  ?Hct [place] stable at 42%. He is taking his full dose aspirin daily as prescribed.  ?No fever, chills, n/v, cough, rash, dizziness, SOB, chest pain, palpitations, abdominal pain or change sin bowel bowel or bladder habits.  ?No bleeding, bruising or petechiae.  ?No swelling, tenderness, numbness or tingling in his extremities.  ?No falls or syncope to report.  ?He has maintained a good appetite and is staying well hydrated throughout the day. His weight is stable at 172 lbs.  ? ?ECOG Performance Status: 0 - Asymptomatic ? ?Medications:  ?Allergies as of 11/28/2021   ? ?   Reactions  ? Lisinopril Other (See Comments)  ? Hyperkalemia  ? Hydrochlorothiazide Hives  ? Amlodipine Besylate Other (See Comments)  ? REACTION: ? caused left axillary nodules/chest flutter  ? Farxiga [dapagliflozin] Rash, Other (See Comments)  ? "swollen penis"  ? ?  ? ?  ?Medication List  ?  ? ?  ? Accurate as of November 28, 2021  9:22 AM. If you have any questions, ask your nurse or doctor.  ?  ?  ? ?  ? ?acetaminophen 500 MG tablet ?Commonly known as: TYLENOL ?Take 1,000 mg by mouth every 6 (six) hours as needed for moderate pain or headache. ?  ?aspirin 325 MG EC tablet ?Take 1 tablet (325 mg total) by mouth daily. ?  ?BD Pen Needle Nano 2nd Gen 32G X 4 MM Misc ?Generic drug: Insulin Pen Needle ?Inject 1 Device into the skin in the morning, at noon, in the evening, and at bedtime. ?  ?cetirizine 10 MG tablet ?Commonly known as: ZYRTEC ?Take 10 mg by mouth daily as needed for allergies. ?  ?Entresto 97-103 MG ?Generic drug:  sacubitril-valsartan ?TAKE 1 TABLET BY MOUTH TWICE A DAY ?  ?glucose blood test strip ?Commonly known as: Prodigy No Coding Blood Gluc ?Use as instructed to check blood sugar twice a day.  DX  E11.40 ?  ?hydrALAZINE 25 MG tablet ?Commonly known as: APRESOLINE ?Take 1 tablet (25 mg total) by mouth 3 (three) times daily. ?  ?isosorbide mononitrate 30 MG 24 hr tablet ?Commonly known as: IMDUR ?Take 15 mg by mouth daily. ?  ?Levemir FlexTouch 100 UNIT/ML FlexPen ?Generic drug: insulin detemir ?Inject 18 Units into the skin every morning. ?  ?metFORMIN 1000 MG tablet ?Commonly known as: GLUCOPHAGE ?Take 1 tablet (1,000 mg total) by mouth 2 (two) times daily with a meal. ?  ?metoprolol succinate 50 MG 24 hr tablet ?Commonly known as: TOPROL-XL ?TAKE 1 TABLET BY MOUTH DAILY. TAKE WITH OR IMMEDIATELY FOLLOWING A MEAL. ?  ?NovoLOG FlexPen 100 UNIT/ML FlexPen ?Generic drug: insulin aspart ?Inject 8 Units into the skin with breakfast, with lunch, and with evening meal. ?  ?rosuvastatin 20 MG tablet ?Commonly known as: CRESTOR ?TAKE 1 TABLET BY MOUTH EVERY DAY ?  ?spironolactone 25 MG tablet ?Commonly known as: ALDACTONE ?TAKE 1/2 TABLET BY MOUTH EVERY DAY ?  ?VITAMIN B-12 PO ?Take 1 tablet by mouth daily. ?  ? ?  ? ? ?Allergies:  ?Allergies  ?Allergen Reactions  ?  Lisinopril Other (See Comments)  ?  Hyperkalemia ?  ? Hydrochlorothiazide Hives  ? Amlodipine Besylate Other (See Comments)  ?  REACTION: ? caused left axillary nodules/chest flutter  ? [place] [Dapagliflozin] Rash and Other (See Comments)  ?  "swollen penis"  ? ? ?Past Medical History, Surgical history, Social history, and Family History were reviewed and updated. ? ?Review of Systems: ?All other 10 point review of systems is negative.  ? ?Physical Exam: ? vitals were not taken for this visit.  ? ?Wt Readings from Last 3 Encounters:  ?11/12/21 173 lb 12.8 oz (78.8 kg)  ?08/30/21 174 lb (78.9 kg)  ?07/10/21 172 lb (78 kg)  ? ? ?Ocular: Sclerae unicteric, pupils  equal, round and reactive to light ?Ear-nose-throat: Oropharynx clear, dentition fair ?Lymphatic: No cervical or supraclavicular adenopathy ?Lungs no rales or rhonchi, good excursion bilaterally ?Heart regular rate and rhythm, no murmur appreciated ?Abd soft, nontender, positive bowel sounds ?MSK no focal spinal tenderness, no joint edema ?Neuro: non-focal, well-oriented, appropriate affect ?Breasts: Deferred  ? ?Lab Results  ?Component Value Date  ? WBC 6.8 08/30/2021  ? HGB 12.8 (L) 08/30/2021  ? HCT 43.6 08/30/2021  ? MCV 61.8 (L) 08/30/2021  ? PLT 377 08/30/2021  ? ?Lab Results  ?Component Value Date  ? FERRITIN 8 (L) 08/30/2021  ? IRON 31 (L) 08/30/2021  ? TIBC 469 (H) 08/30/2021  ? UIBC 438 (H) 08/30/2021  ? IRONPCTSAT 7 (L) 08/30/2021  ? ?Lab Results  ?Component Value Date  ? RETICCTPCT 1.1 08/30/2021  ? RBC 7.06 (H) 08/30/2021  ? RBC 6.98 (H) 08/30/2021  ? ?No results found for: KPAFRELGTCHN, LAMBDASER, KAPLAMBRATIO ?No results found for: IGGSERUM, IGA, IGMSERUM ?No results found for: TOTALPROTELP, ALBUMINELP, A1GS, A2GS, BETS, BETA2SER, GAMS, MSPIKE, SPEI ?  Chemistry   ?   ?Component Value Date/Time  ? NA 141 11/12/2021 0959  ? K 4.8 11/12/2021 0959  ? CL 103 11/12/2021 0959  ? CO2 22 11/12/2021 0959  ? BUN 12 11/12/2021 0959  ? CREATININE 0.88 11/12/2021 0959  ? CREATININE 0.89 08/30/2021 1145  ? CREATININE 0.84 11/30/2013 0856  ?    ?Component Value Date/Time  ? CALCIUM 9.2 11/12/2021 0959  ? ALKPHOS 42 08/30/2021 1145  ? AST 14 (L) 08/30/2021 1145  ? ALT 9 08/30/2021 1145  ? BILITOT 0.7 08/30/2021 1145  ?  ? ? ? ?Impression and Plan: Mr. Hartline is a very pleasant 71 yo African American gentleman with polycythemia vera, JAK 2 positive.  ?No phlebotomy needed, Hct 42%.  ?Follow-up in 4 months.  ? ?Lottie Dawson, NP ?4/5/20239:22 AM ? ?

## 2021-12-03 ENCOUNTER — Ambulatory Visit (INDEPENDENT_AMBULATORY_CARE_PROVIDER_SITE_OTHER): Payer: Medicare PPO | Admitting: Internal Medicine

## 2021-12-03 ENCOUNTER — Encounter: Payer: Self-pay | Admitting: Internal Medicine

## 2021-12-03 VITALS — BP 134/84 | HR 68 | Temp 97.8°F | Resp 16 | Ht 76.0 in | Wt 173.4 lb

## 2021-12-03 DIAGNOSIS — Z794 Long term (current) use of insulin: Secondary | ICD-10-CM

## 2021-12-03 DIAGNOSIS — D45 Polycythemia vera: Secondary | ICD-10-CM

## 2021-12-03 DIAGNOSIS — I420 Dilated cardiomyopathy: Secondary | ICD-10-CM

## 2021-12-03 DIAGNOSIS — E1165 Type 2 diabetes mellitus with hyperglycemia: Secondary | ICD-10-CM

## 2021-12-03 DIAGNOSIS — I10 Essential (primary) hypertension: Secondary | ICD-10-CM

## 2021-12-03 NOTE — Progress Notes (Signed)
? ?Subjective:  ? ? Patient ID: [name], male    DOB: [birth year], 71 y.o.   MRN: [id number] ? ?DOS:  12/03/2021 ?Type of visit - description: Routine visit ? ?Today we talk about his chronic medical problems and vaccinations. ?Notes from hematology and cardiology reviewed. ?Good med compliance ?He denies fever chills ?No chest pain or difficulty breathing ?No headache or dizziness. ?Ambulatory BPs are very good ? ?Review of Systems ?See above  ? ?Past Medical History:  ?Diagnosis Date  ? Abnormal myocardial perfusion study 2006  ? EF 44% ? inferoseptal ischemia. no cath done  ? Benign neoplasm of cecum   ? Breast mass, right 03/2008  ? Cardiomyopathy ([place]) 07/20/2018  ? Ejection fraction 4045% in September 2019  ? Centrilobular emphysema ([place]) 08/25/2018  ? 02/04/2018-high-res CT- no evidence of interstitial lung disease, severe centrilobular emphysema and mild diffuse bronchial wall thickening suggesting COPD, no calcified pleural plaques no pleural effusion, mild smooth bilateral pleural thickening which is nonspecific  02/04/2018-pulmonary function test- FVC 3.37 (71% predicted), postbronchodilator ratio 50, postbronchodilator FEV1 54, mid flow reve  ? CHF (congestive heart failure) ([place])   ? COLONIC POLYPS, HX OF 03/18/2007  ? Qualifier: Diagnosis of  By: [name]   ? Contact dermatitis 01/31/2013  ? COPD (chronic obstructive pulmonary disease) ([place])   ? "dx'd 12/2017"  ? COPD GOLD II A  08/25/2018  ? 02/04/2018-high-res CT- no evidence of interstitial lung disease, severe centrilobular emphysema and mild diffuse bronchial wall thickening suggesting COPD, no calcified pleural plaques no pleural effusion, mild smooth bilateral pleural thickening which is nonspecific  02/04/2018-pulmonary function test- FVC 3.37 (71% predicted), postbronchodilator ratio 50, postbronchodilator FEV1 54, mid flow reve  ? Coronary artery disease   ? Critical aortic valve stenosis 01/12/2018  ? Calculated valve area less than  0.5 cm? mean gradient more than 60 mm of mercury  ? DEGENERATIVE JOINT DISEASE, BACK 03/18/2007  ? Qualifier: Diagnosis of  By: [name]   ? Depressed left ventricular ejection fraction 03/28/2020  ? Diabetes mellitus ([place]) 07/25/2020  ? Dyslipidemia 04/20/2020  ? Dyspnea on exertion 01/12/2018  ? ERECTILE DYSFUNCTION, ORGANIC 02/10/2009  ? Qualifier: Diagnosis of  By: [name]   ? Former cigarette smoker 10/12/2018  ? Quit in 2012 41-pack-year smoking history  ? Goals of care, counseling/discussion 08/24/2020  ? H/O aortic valve replacement 05/05/2020  ? Heart murmur   ? HEMORRHOIDS, INTERNAL 03/18/2007  ? Qualifier: Diagnosis of  By: [name]   ? HTN (hypertension) 10/21/2007  ?     ? Hyperlipemia 10/21/2007  ? Qualifier: Diagnosis of  By: [name]   ? Hypertension   ? Hypoglycemia due to insulin 03/26/2020  ? Low back pain   ? MYOCARDIAL PERFUSION SCAN, WITH STRESS TEST, ABNORMAL 08/11/2008  ? Qualifier: Diagnosis of  By: Johnsie Cancel, MD, Rona Ravens   ? Nocturnal hypoxemia 08/25/2018  ? 04/09/18 >>> ONO - telephone note>>> Patient's ONO on room air showed that he was less than 88% for more than an hour. Patient needs to be on 2 L at HS.  10/06/2018-overnight oximetry-entire duration was 4 hours and 23 minutes, SPO2 less than 88% for 25 minutes and 32 seconds    ? Oxygen deficiency   ? uses 2L at night  ? Paroxysmal atrial fibrillation ([place]) 10/12/2018  ? PCP NOTES >>>>>>>>>>>>>>>>> 05/14/2020  ? Polycythemia vera ([place]) 08/04/2020  ? S/P AVR 03/04/2018  ?  AVR 23 mm Edwards Magna Valve - bioprostetic 03/04/2018 LIMA - LAD  ? Status post coronary artery bypass graft 03/20/2018  ? LIMA to LAD during aortic valve replacement surgery 03/04/2018  ? Type 2 diabetes mellitus with hyperglycemia, with long-term current use of insulin ([place]) 04/20/2020  ? Type 2 diabetes mellitus, uncontrolled, with neuropathy 05/20/2008  ? Qualifier: Diagnosis of  By: [name]   ? ? ?Past Surgical History:   ?Procedure Laterality Date  ? ABDOMINAL AORTOGRAM N/A 01/13/2018  ? Procedure: ABDOMINAL AORTOGRAM;  Surgeon: Troy Sine, MD;  Location: [facility];  Service: Cardiovascular;  Laterality: N/A;  ? AORTIC VALVE REPLACEMENT N/A 03/04/2018  ? Procedure: AORTIC VALVE REPLACEMENT (AVR) 57m Edwards Magna Ease Tissue Valve.;  Surgeon: HMelrose Nakayama MD;  Location: [facility]  Service: Open Heart Surgery;  Laterality: N/A;  ? CARDIAC VALVE REPLACEMENT    ? COLONOSCOPY W/ POLYPECTOMY    ? COLONOSCOPY WITH PROPOFOL N/A 02/22/2020  ? Procedure: COLONOSCOPY WITH PROPOFOL;  Surgeon: DDoran Stabler MD;  [facility]: [facility];  Service: Gastroenterology;  Laterality: N/A;  ? CORONARY ARTERY BYPASS GRAFT N/A 03/04/2018  ? Procedure: CORONARY ARTERY BYPASS GRAFTING (CABG) x1:  LIMA to LAD.;  Surgeon: HMelrose Nakayama MD;  [facility]  Service: Open Heart Surgery;  Laterality: N/A;  ? HEMOSTASIS CLIP PLACEMENT  02/22/2020  ? Procedure: HEMOSTASIS CLIP PLACEMENT;  Surgeon: DDoran Stabler MD;  [facility];  Service: Gastroenterology;;  ? INTRAVASCULAR PRESSURE WIRE/FFR STUDY N/A 05/10/2020  ? Procedure: INTRAVASCULAR PRESSURE WIRE/FFR STUDY;  Surgeon: CSherren Mocha MD;  [facility];  Service: Cardiovascular;  Laterality: N/A;  ? LEFT HEART CATH AND CORS/GRAFTS ANGIOGRAPHY N/A 05/10/2020  ? Procedure: LEFT HEART CATH AND CORS/GRAFTS ANGIOGRAPHY;  Surgeon: CSherren Mocha MD;  [facility];  Service: Cardiovascular;  Laterality: N/A;  ? LIPOMA EXCISION Left 07/2008  ?  "back of shoulder" Dr TGershon Crane ? POLYPECTOMY  02/22/2020  ? Procedure: POLYPECTOMY;  Surgeon: DDoran Stabler MD;  [facility];  Service: Gastroenterology;;  ? RIGHT/LEFT HEART CATH AND CORONARY ANGIOGRAPHY N/A 01/13/2018  ? Procedure: RIGHT/LEFT HEART CATH AND CORONARY ANGIOGRAPHY;  Surgeon: KTroy Sine MD;  [facility];  Service: Cardiovascular;  Laterality:  N/A;  ? TEE WITHOUT CARDIOVERSION N/A 03/04/2018  ? Procedure: TRANSESOPHAGEAL ECHOCARDIOGRAM (TEE);  Surgeon: HMelrose Nakayama MD;  Location: [facility]  Service: Open Heart Surgery;  Laterality: N/A;  ? TONSILLECTOMY    ? WISDOM TOOTH EXTRACTION    ? ? ?Current Outpatient Medications  ?Medication Instructions  ? acetaminophen (TYLENOL) 1,000 mg, Oral, Every 6 hours PRN  ? aspirin 325 mg, Oral, Daily  ? cetirizine (ZYRTEC) 10 mg, Oral, Daily PRN  ? Cyanocobalamin (VITAMIN B-12 PO) 1 tablet, Oral, Daily  ? ENTRESTO 97-103 MG TAKE 1 TABLET BY MOUTH TWICE A DAY  ? glucose blood (PRODIGY NO CODING BLOOD GLUC) test strip Use as instructed to check blood sugar twice a day.  DX  E11.40   ? hydrALAZINE (APRESOLINE) 25 mg, Oral, 3 times daily  ? Insulin Pen Needle (BD PEN NEEDLE NANO 2ND GEN) 32G X 4 MM MISC 1 Device, Subcutaneous, 4 times daily  ? isosorbide mononitrate (IMDUR) 15 mg, Oral, Daily  ? Levemir FlexTouch 18 Units, Subcutaneous, Every morning  ? metFORMIN (GLUCOPHAGE) 1,000 mg, Oral, 2 times daily with meals  ? metoprolol succinate (TOPROL-XL) 50 MG 24 hr  tablet TAKE 1 TABLET BY MOUTH DAILY. TAKE WITH OR IMMEDIATELY FOLLOWING A MEAL.  ? NovoLOG FlexPen 8 Units, Subcutaneous, 3 times daily with meals  ? rosuvastatin (CRESTOR) 20 MG tablet TAKE 1 TABLET BY MOUTH EVERY DAY  ? spironolactone (ALDACTONE) 25 MG tablet TAKE 1/2 TABLET BY MOUTH EVERY DAY  ? ? ?   ?Objective:  ? Physical Exam ?BP 134/84 (BP Location: Left Arm, Patient Position: Sitting, Cuff Size: Small)   Pulse 68   Temp 97.8 ?F (36.6 ?C) (Oral)   Resp 16   Ht '6\' 4"'$  (1.93 m)   Wt 173 lb 6 oz (78.6 kg)   SpO2 94%   BMI 21.10 kg/m?  ?General:   ?Well developed, NAD, BMI noted. ?HEENT:  ?Normocephalic . Face symmetric, atraumatic ?Lungs:  ?CTA B ?Normal respiratory effort, no intercostal retractions, no accessory muscle use. ?Heart: RRR,  no murmur.  ?Lower extremities: no pretibial edema bilaterally  ?Skin: Not pale. Not jaundice ?Neurologic:   ?alert & oriented X3.  ?Speech normal, gait appropriate for age and unassisted ?Psych--  ?Cognition and judgment appear intact.  ?Cooperative with normal attention span and concentration.  ?Behavior appropriate. ?No an

## 2021-12-03 NOTE — Patient Instructions (Addendum)
Vaccines you would benefit from: ?COVID booster ?Tdap (tetanus shot) ?Shingrix (to prevent shingles) ? ? ?GO TO THE FRONT DESK, PLEASE SCHEDULE YOUR APPOINTMENTS ?Come back for a physical exam by October 2023 ?

## 2021-12-03 NOTE — Assessment & Plan Note (Signed)
Labs last week reviewed  >>> potassium, creatinine, LFTs were okay. ?DM: Per Endo, last visit  06-2021, A1c was 7.5.  Plans to go back to them soon. ?HTN: Ambulatory BPs never more 130/80, occasionally 110/70, denies dizziness.  Continue Imdur, metoprolol, Aldactone. ?CAD, cardiomyopathy, AoVR: Saw cardiology 11/12/2021, felt to be stable, no changes made ?Polycythemia vera.  Saw hematology last week.  Felt to be stable. ?Preventive care: Rec the following immunizations: COVID, Tdap, Shingrix. ?RTC 05-2022 CPX ?

## 2021-12-04 ENCOUNTER — Telehealth: Payer: Self-pay | Admitting: *Deleted

## 2021-12-04 ENCOUNTER — Other Ambulatory Visit: Payer: Self-pay | Admitting: Internal Medicine

## 2021-12-04 NOTE — Telephone Encounter (Signed)
Per 11/28/21 los - called and gave upcoming appointments - confirmed ?

## 2021-12-19 ENCOUNTER — Other Ambulatory Visit: Payer: Self-pay

## 2021-12-19 MED ORDER — LEVEMIR FLEXTOUCH 100 UNIT/ML ~~LOC~~ SOPN
18.0000 [IU] | PEN_INJECTOR | Freq: Every morning | SUBCUTANEOUS | 4 refills | Status: DC
Start: 2021-12-19 — End: 2022-02-20

## 2021-12-27 ENCOUNTER — Other Ambulatory Visit (HOSPITAL_COMMUNITY): Payer: Self-pay | Admitting: Internal Medicine

## 2022-02-08 ENCOUNTER — Telehealth: Payer: Self-pay

## 2022-02-08 NOTE — Telephone Encounter (Signed)
Spouse called in wanting refills on Levemir. Informed her that Patient should have refills at [facility] on [address]. She will call and refill there

## 2022-02-20 ENCOUNTER — Other Ambulatory Visit: Payer: Self-pay | Admitting: Internal Medicine

## 2022-02-25 ENCOUNTER — Other Ambulatory Visit: Payer: Self-pay

## 2022-02-25 MED ORDER — LEVEMIR FLEXPEN 100 UNIT/ML ~~LOC~~ SOPN: PEN_INJECTOR | SUBCUTANEOUS | 0 refills | Status: DC

## 2022-03-04 ENCOUNTER — Other Ambulatory Visit: Payer: Self-pay | Admitting: Internal Medicine

## 2022-03-04 ENCOUNTER — Other Ambulatory Visit: Payer: Self-pay

## 2022-03-29 ENCOUNTER — Other Ambulatory Visit: Payer: Self-pay | Admitting: Cardiology

## 2022-03-29 ENCOUNTER — Other Ambulatory Visit: Payer: Self-pay

## 2022-03-29 ENCOUNTER — Inpatient Hospital Stay: Payer: Medicare PPO | Admitting: Family

## 2022-03-29 ENCOUNTER — Inpatient Hospital Stay: Payer: Medicare PPO

## 2022-03-29 ENCOUNTER — Encounter: Payer: Self-pay | Admitting: Family

## 2022-03-29 ENCOUNTER — Inpatient Hospital Stay: Payer: Medicare PPO | Attending: Hematology & Oncology

## 2022-03-29 ENCOUNTER — Other Ambulatory Visit: Payer: Self-pay | Admitting: Internal Medicine

## 2022-03-29 VITALS — BP 141/80 | HR 77 | Temp 98.2°F | Resp 16 | Ht 76.0 in | Wt 172.0 lb

## 2022-03-29 DIAGNOSIS — D500 Iron deficiency anemia secondary to blood loss (chronic): Secondary | ICD-10-CM | POA: Diagnosis not present

## 2022-03-29 DIAGNOSIS — D45 Polycythemia vera: Secondary | ICD-10-CM

## 2022-03-29 LAB — CBC WITH DIFFERENTIAL (CANCER CENTER ONLY)
Abs Immature Granulocytes: 0.05 10*3/uL (ref 0.00–0.07)
Basophils Absolute: 0.1 10*3/uL (ref 0.0–0.1)
Basophils Relative: 1 %
Eosinophils Absolute: 0.1 10*3/uL (ref 0.0–0.5)
Eosinophils Relative: 2 %
HCT: 43.6 % (ref 39.0–52.0)
Hemoglobin: 12.8 g/dL — ABNORMAL LOW (ref 13.0–17.0)
Immature Granulocytes: 1 %
Lymphocytes Relative: 20 %
Lymphs Abs: 1.7 10*3/uL (ref 0.7–4.0)
MCH: 18.2 pg — ABNORMAL LOW (ref 26.0–34.0)
MCHC: 29.4 g/dL — ABNORMAL LOW (ref 30.0–36.0)
MCV: 62.1 fL — ABNORMAL LOW (ref 80.0–100.0)
Monocytes Absolute: 0.7 10*3/uL (ref 0.1–1.0)
Monocytes Relative: 9 %
Neutro Abs: 5.9 10*3/uL (ref 1.7–7.7)
Neutrophils Relative %: 67 %
Platelet Count: 388 10*3/uL (ref 150–400)
RBC: 7.02 MIL/uL — ABNORMAL HIGH (ref 4.22–5.81)
RDW: 24.1 % — ABNORMAL HIGH (ref 11.5–15.5)
WBC Count: 8.6 10*3/uL (ref 4.0–10.5)
nRBC: 0 % (ref 0.0–0.2)

## 2022-03-29 LAB — CMP (CANCER CENTER ONLY)
ALT: 7 U/L (ref 0–44)
AST: 12 U/L — ABNORMAL LOW (ref 15–41)
Albumin: 4.6 g/dL (ref 3.5–5.0)
Alkaline Phosphatase: 42 U/L (ref 38–126)
Anion gap: 8 (ref 5–15)
BUN: 13 mg/dL (ref 8–23)
CO2: 27 mmol/L (ref 22–32)
Calcium: 9.9 mg/dL (ref 8.9–10.3)
Chloride: 103 mmol/L (ref 98–111)
Creatinine: 0.94 mg/dL (ref 0.61–1.24)
GFR, Estimated: >60 mL/min (ref >60)
Glucose, Bld: 182 mg/dL — ABNORMAL HIGH (ref 70–99)
Potassium: 4.4 mmol/L (ref 3.5–5.1)
Sodium: 138 mmol/L (ref 135–145)
Total Bilirubin: 0.5 mg/dL (ref 0.3–1.2)
Total Protein: 7.3 g/dL (ref 6.5–8.1)

## 2022-03-29 LAB — FERRITIN: Ferritin: 5 ng/mL — ABNORMAL LOW (ref 24–336)

## 2022-03-29 LAB — LACTATE DEHYDROGENASE: LDH: 153 U/L (ref 98–192)

## 2022-03-29 NOTE — Progress Notes (Signed)
Hematology and Oncology Follow Up Visit  [name] [id number] 03-Nov-1950 71 y.o. 03/29/2022   Principle Diagnosis:  Polycythemia vera- JAK2 (+)   Current Therapy:        Phlebotomy to maintain hematocrit less than 45% Aspirin 325 mg p.o. daily-on full dose for cardiac issues   Interim History:  [name] is here today for follow-up. Hct is stable at 43.6%. No phlebotomy needed this visit.  He is doing well and has no complaints at this time.  He has occasional SOB with over exertion outside in the heat and will take a break to rest when needed.  No fever, chills, n/v, cough, rash, dizziness, chest pain, palpitations, abdominal pain or changes in bowel or bladder habits.  No swelling, tenderness, numbness or tingling in her extremities.  No falls or syncope.  Appetite and hydration are good. His weight is stable at 172 lbs.   ECOG Performance Status: 0 - Asymptomatic  Medications:  Allergies as of 03/29/2022       Reactions   Lisinopril Other (See Comments)   Hyperkalemia   Hydrochlorothiazide Hives   Amlodipine Besylate Other (See Comments)   REACTION: ? caused left axillary nodules/chest flutter   Farxiga [dapagliflozin] Rash, Other (See Comments)   "swollen penis"        Medication List        Accurate as of March 29, 2022  1:19 PM. If you have any questions, ask your nurse or doctor.          acetaminophen 500 MG tablet Commonly known as: TYLENOL Take 1,000 mg by mouth every 6 (six) hours as needed for moderate pain or headache.   aspirin EC 325 MG tablet Take 1 tablet (325 mg total) by mouth daily.   BD Pen Needle Nano 2nd Gen 32G X 4 MM Misc Generic drug: Insulin Pen Needle Inject 1 Device into the skin in the morning, at noon, in the evening, and at bedtime.   cetirizine 10 MG tablet Commonly known as: ZYRTEC Take 10 mg by mouth daily as needed for allergies.   Entresto 97-103 MG Generic drug: sacubitril-valsartan Take 1 tablet by  mouth 2 (two) times daily. NEEDS FOLLOW UP APPOINTMENT FOR MORE REFILLS   glucose blood test strip Commonly known as: Prodigy No Coding Blood Gluc Use as instructed to check blood sugar twice a day.  DX  E11.40   hydrALAZINE 25 MG tablet Commonly known as: APRESOLINE Take 1 tablet (25 mg total) by mouth 3 (three) times daily.   isosorbide mononitrate 30 MG 24 hr tablet Commonly known as: IMDUR Take 15 mg by mouth daily.   Levemir FlexPen 100 UNIT/ML FlexPen Generic drug: insulin detemir INJECT 18 UNITS INTO THE SKIN EVERY MORNING.   metFORMIN 1000 MG tablet Commonly known as: GLUCOPHAGE TAKE 1 TABLET BY MOUTH 2 TIMES DAILY WITH A MEAL   metoprolol succinate 50 MG 24 hr tablet Commonly known as: TOPROL-XL TAKE 1 TABLET BY MOUTH DAILY. TAKE WITH OR IMMEDIATELY FOLLOWING A MEAL.   NovoLOG FlexPen 100 UNIT/ML FlexPen Generic drug: insulin aspart Inject 8 Units into the skin with breakfast, with lunch, and with evening meal.   rosuvastatin 20 MG tablet Commonly known as: CRESTOR TAKE 1 TABLET BY MOUTH EVERY DAY   spironolactone 25 MG tablet Commonly known as: ALDACTONE TAKE 1/2 TABLET BY MOUTH EVERY DAY   VITAMIN B-12 PO Take 1 tablet by mouth daily.        Allergies:  Allergies  Allergen Reactions  Lisinopril Other (See Comments)    Hyperkalemia    Hydrochlorothiazide Hives   Amlodipine Besylate Other (See Comments)    REACTION: ? caused left axillary nodules/chest flutter   Farxiga [Dapagliflozin] Rash and Other (See Comments)    "swollen penis"    Past Medical History, Surgical history, Social history, and Family History were reviewed and updated.  Review of Systems: All other 10 point review of systems is negative.   Physical Exam:  vitals were not taken for this visit.   Wt Readings from Last 3 Encounters:  12/03/21 173 lb 6 oz (78.6 kg)  11/28/21 172 lb (78 kg)  11/12/21 173 lb 12.8 oz (78.8 kg)    Ocular: Sclerae unicteric, pupils equal,  round and reactive to light Ear-nose-throat: Oropharynx clear, dentition fair Lymphatic: No cervical or supraclavicular adenopathy Lungs no rales or rhonchi, good excursion bilaterally Heart regular rate and rhythm, no murmur appreciated Abd soft, nontender, positive bowel sounds MSK no focal spinal tenderness, no joint edema Neuro: non-focal, well-oriented, appropriate affect Breasts: Deferred   Lab Results  Component Value Date   WBC 8.6 03/29/2022   HGB 12.8 (L) 03/29/2022   HCT 43.6 03/29/2022   MCV 62.1 (L) 03/29/2022   PLT 388 03/29/2022   Lab Results  Component Value Date   FERRITIN 8 (L) 11/28/2021   IRON 29 (L) 11/28/2021   TIBC 461 (H) 11/28/2021   UIBC 432 (H) 11/28/2021   IRONPCTSAT 6 (L) 11/28/2021   Lab Results  Component Value Date   RETICCTPCT 1.1 08/30/2021   RBC 7.02 (H) 03/29/2022   No results found for: "KPAFRELGTCHN", "LAMBDASER", "KAPLAMBRATIO" No results found for: "IGGSERUM", "IGA", "IGMSERUM" No results found for: "TOTALPROTELP", "ALBUMINELP", "A1GS", "A2GS", "BETS", "BETA2SER", "GAMS", "MSPIKE", "SPEI"   Chemistry      Component Value Date/Time   NA 140 11/28/2021 0916   NA 141 11/12/2021 0959   K 4.3 11/28/2021 0916   CL 104 11/28/2021 0916   CO2 29 11/28/2021 0916   BUN 12 11/28/2021 0916   BUN 12 11/12/2021 0959   CREATININE 0.97 11/28/2021 0916   CREATININE 0.84 11/30/2013 0856      Component Value Date/Time   CALCIUM 9.8 11/28/2021 0916   ALKPHOS 42 11/28/2021 0916   AST 14 (L) 11/28/2021 0916   ALT 7 11/28/2021 0916   BILITOT 0.5 11/28/2021 0916       Impression and Plan: [name] is a very pleasant 71 yo African American gentleman with polycythemia vera, JAK 2 positive.  No phlebotomy needed, Hct 43.6%.  Follow-up in 6 months.   Lottie Dawson, NP 8/4/20231:19 PM

## 2022-04-01 ENCOUNTER — Other Ambulatory Visit: Payer: Self-pay | Admitting: *Deleted

## 2022-04-01 LAB — IRON AND IRON BINDING CAPACITY (CC-WL,HP ONLY)
Iron: 27 ug/dL — ABNORMAL LOW (ref 45–182)
Saturation Ratios: 6 % — ABNORMAL LOW (ref 17.9–39.5)
TIBC: 449 ug/dL (ref 250–450)
UIBC: 422 ug/dL — ABNORMAL HIGH (ref 117–376)

## 2022-04-04 ENCOUNTER — Ambulatory Visit: Payer: Medicare Other

## 2022-04-08 ENCOUNTER — Ambulatory Visit (INDEPENDENT_AMBULATORY_CARE_PROVIDER_SITE_OTHER): Payer: Medicare PPO

## 2022-04-08 DIAGNOSIS — Z0000 Encounter for general adult medical examination without abnormal findings: Secondary | ICD-10-CM

## 2022-04-08 NOTE — Patient Instructions (Signed)
[name] , Thank you for taking time to come for your Medicare Wellness Visit. I appreciate your ongoing commitment to your health goals. Please review the following plan we discussed and let me know if I can assist you in the future.   Screening recommendations/referrals: Colonoscopy: 03/03/20 due 03/04/23 Recommended yearly ophthalmology/optometry visit for glaucoma screening and checkup Recommended yearly dental visit for hygiene and checkup  Vaccinations: Influenza vaccine: up to date Pneumococcal vaccine: up to date Tdap vaccine: Due-May obtain vaccine at your local pharmacy.  Shingles vaccine: Due-May obtain vaccine at your local pharmacy.    Covid-19: Due-May obtain vaccine at your local pharmacy.   Advanced directives: no    Conditions/risks identified: see problem list   Next appointment: Follow up in one year for your annual wellness visit. 04/10/23  Preventive Care 65 Years and Older, Male Preventive care refers to lifestyle choices and visits with your health care provider that can promote health and wellness. What does preventive care include? A yearly physical exam. This is also called an annual well check. Dental exams once or twice a year. Routine eye exams. Ask your health care provider how often you should have your eyes checked. Personal lifestyle choices, including: Daily care of your teeth and gums. Regular physical activity. Eating a healthy diet. Avoiding tobacco and drug use. Limiting alcohol use. Practicing safe sex. Taking low doses of aspirin every day. Taking vitamin and mineral supplements as recommended by your health care provider. What happens during an annual well check? The services and screenings done by your health care provider during your annual well check will depend on your age, overall health, lifestyle risk factors, and family history of disease. Counseling  Your health care provider may ask you questions about your: Alcohol  use. Tobacco use. Drug use. Emotional well-being. Home and relationship well-being. Sexual activity. Eating habits. History of falls. Memory and ability to understand (cognition). Work and work Statistician. Screening  You may have the following tests or measurements: Height, weight, and BMI. Blood pressure. Lipid and cholesterol levels. These may be checked every 5 years, or more frequently if you are over 74 years old. Skin check. Lung cancer screening. You may have this screening every year starting at age 11 if you have a 30-pack-year history of smoking and currently smoke or have quit within the past 15 years. Fecal occult blood test (FOBT) of the stool. You may have this test every year starting at age 60. Flexible sigmoidoscopy or colonoscopy. You may have a sigmoidoscopy every 5 years or a colonoscopy every 10 years starting at age 100. Prostate cancer screening. Recommendations will vary depending on your family history and other risks. Hepatitis C blood test. Hepatitis B blood test. Sexually transmitted disease (STD) testing. Diabetes screening. This is done by checking your blood sugar (glucose) after you have not eaten for a while (fasting). You may have this done every 1-3 years. Abdominal aortic aneurysm (AAA) screening. You may need this if you are a current or former smoker. Osteoporosis. You may be screened starting at age 57 if you are at high risk. Talk with your health care provider about your test results, treatment options, and if necessary, the need for more tests. Vaccines  Your health care provider may recommend certain vaccines, such as: Influenza vaccine. This is recommended every year. Tetanus, diphtheria, and acellular pertussis (Tdap, Td) vaccine. You may need a Td booster every 10 years. Zoster vaccine. You may need this after age 34. Pneumococcal 13-valent conjugate (  PCV13) vaccine. One dose is recommended after age 71. Pneumococcal polysaccharide  (PPSV23) vaccine. One dose is recommended after age 54. Talk to your health care provider about which screenings and vaccines you need and how often you need them. This information is not intended to replace advice given to you by your health care provider. Make sure you discuss any questions you have with your health care provider. Document Released: 09/08/2015 Document Revised: 05/01/2016 Document Reviewed: 06/13/2015 Elsevier Interactive Patient Education  2017 [place] Prevention in the Home Falls can cause injuries. They can happen to people of all ages. There are many things you can do to make your home safe and to help prevent falls. What can I do on the outside of my home? Regularly fix the edges of walkways and driveways and fix any cracks. Remove anything that might make you trip as you walk through a door, such as a raised step or threshold. Trim any bushes or trees on the path to your home. Use bright outdoor lighting. Clear any walking paths of anything that might make someone trip, such as rocks or tools. Regularly check to see if handrails are loose or broken. Make sure that both sides of any steps have handrails. Any raised decks and porches should have guardrails on the edges. Have any leaves, snow, or ice cleared regularly. Use sand or salt on walking paths during winter. Clean up any spills in your garage right away. This includes oil or grease spills. What can I do in the bathroom? Use night lights. Install grab bars by the toilet and in the tub and shower. Do not use towel bars as grab bars. Use non-skid mats or decals in the tub or shower. If you need to sit down in the shower, use a plastic, non-slip stool. Keep the floor dry. Clean up any water that spills on the floor as soon as it happens. Remove soap buildup in the tub or shower regularly. Attach bath mats securely with double-sided non-slip rug tape. Do not have throw rugs and other things on the  floor that can make you trip. What can I do in the bedroom? Use night lights. Make sure that you have a light by your bed that is easy to reach. Do not use any sheets or blankets that are too big for your bed. They should not hang down onto the floor. Have a firm chair that has side arms. You can use this for support while you get dressed. Do not have throw rugs and other things on the floor that can make you trip. What can I do in the kitchen? Clean up any spills right away. Avoid walking on wet floors. Keep items that you use a lot in easy-to-reach places. If you need to reach something above you, use a strong step stool that has a grab bar. Keep electrical cords out of the way. Do not use floor polish or wax that makes floors slippery. If you must use wax, use non-skid floor wax. Do not have throw rugs and other things on the floor that can make you trip. What can I do with my stairs? Do not leave any items on the stairs. Make sure that there are handrails on both sides of the stairs and use them. Fix handrails that are broken or loose. Make sure that handrails are as long as the stairways. Check any carpeting to make sure that it is firmly attached to the stairs. Fix any carpet that is  loose or worn. Avoid having throw rugs at the top or bottom of the stairs. If you do have throw rugs, attach them to the floor with carpet tape. Make sure that you have a light switch at the top of the stairs and the bottom of the stairs. If you do not have them, ask someone to add them for you. What else can I do to help prevent falls? Wear shoes that: Do not have high heels. Have rubber bottoms. Are comfortable and fit you well. Are closed at the toe. Do not wear sandals. If you use a stepladder: Make sure that it is fully opened. Do not climb a closed stepladder. Make sure that both sides of the stepladder are locked into place. Ask someone to hold it for you, if possible. Clearly mark and make  sure that you can see: Any grab bars or handrails. First and last steps. Where the edge of each step is. Use tools that help you move around (mobility aids) if they are needed. These include: Canes. Walkers. Scooters. Crutches. Turn on the lights when you go into a dark area. Replace any light bulbs as soon as they burn out. Set up your furniture so you have a clear path. Avoid moving your furniture around. If any of your floors are uneven, fix them. If there are any pets around you, be aware of where they are. Review your medicines with your doctor. Some medicines can make you feel dizzy. This can increase your chance of falling. Ask your doctor what other things that you can do to help prevent falls. This information is not intended to replace advice given to you by your health care provider. Make sure you discuss any questions you have with your health care provider. Document Released: 06/08/2009 Document Revised: 01/18/2016 Document Reviewed: 09/16/2014 Elsevier Interactive Patient Education  2017 [employer].

## 2022-04-08 NOTE — Progress Notes (Addendum)
Subjective:   [name] is a 71 y.o. male who presents for Medicare Annual/Subsequent preventive examination.  I connected with  [name] on 04/08/22 by a audio enabled telemedicine application and verified that I am speaking with the correct person using two identifiers.  Patient Location: Home  Provider Location: Office/Clinic  I discussed the limitations of evaluation and management by telemedicine. The patient expressed understanding and agreed to proceed.   Review of Systems     Cardiac Risk Factors include: advanced age (>40mn, >>28women);hypertension;diabetes mellitus;dyslipidemia;male gender     Objective:    Today's Vitals   04/08/22 0826  PainSc: 0-No pain   There is no height or weight on file to calculate BMI.     04/08/2022    8:25 AM 03/29/2022    1:23 PM 11/28/2021    9:28 AM 08/30/2021   12:34 PM 03/30/2021    8:26 AM 03/28/2021   10:20 AM 12/26/2020   10:38 AM  Advanced Directives  Does Patient Have a Medical Advance Directive? No No No No No No No  Type of Advance Directive   Living will;Healthcare Power of Attorney   Living will;Healthcare Power of Attorney Living will;Healthcare Power of Attorney  Does patient want to make changes to medical advance directive?    No - Patient declined   No - Patient declined  Copy of [place] Chart?   No - copy requested      Would patient like information on creating a medical advance directive? No - Patient declined No - Patient declined No - Patient declined  Yes (MAU/Ambulatory/Procedural Areas - Information given)      Current Medications (verified) Outpatient Encounter Medications as of 04/08/2022  Medication Sig   acetaminophen (TYLENOL) 500 MG tablet Take 1,000 mg by mouth every 6 (six) hours as needed for moderate pain or headache.   amoxicillin (AMOXIL) 500 MG capsule TAKE 4 CAPSULES 1 HOUR BEFORE DENTAL PROCEDURE.   amoxicillin (AMOXIL) 500 MG capsule 500 mg. Take 4 capsules  before dentist appointments.   aspirin EC 325 MG EC tablet Take 1 tablet (325 mg total) by mouth daily.   cetirizine (ZYRTEC) 10 MG tablet Take 10 mg by mouth daily as needed for allergies.   Cyanocobalamin (VITAMIN B-12 PO) Take 1 tablet by mouth daily.   glucose blood (PRODIGY NO CODING BLOOD GLUC) test strip Use as instructed to check blood sugar twice a day.  DX  E11.40   hydrALAZINE (APRESOLINE) 25 MG tablet Take 1 tablet (25 mg total) by mouth 3 (three) times daily.   insulin aspart (NOVOLOG FLEXPEN) 100 UNIT/ML FlexPen Inject 8 Units into the skin with breakfast, with lunch, and with evening meal.   insulin detemir (LEVEMIR FLEXPEN) 100 UNIT/ML FlexPen INJECT 18 UNITS INTO THE SKIN EVERY MORNING.   Insulin Pen Needle (BD PEN NEEDLE NANO 2ND GEN) 32G X 4 MM MISC Inject 1 Device into the skin in the morning, at noon, in the evening, and at bedtime.   isosorbide mononitrate (IMDUR) 30 MG 24 hr tablet Take 15 mg by mouth daily.   metFORMIN (GLUCOPHAGE) 1000 MG tablet TAKE 1 TABLET BY MOUTH 2 TIMES DAILY WITH A MEAL   metoprolol succinate (TOPROL-XL) 50 MG 24 hr tablet TAKE 1 TABLET BY MOUTH DAILY. TAKE WITH OR IMMEDIATELY FOLLOWING A MEAL.   rosuvastatin (CRESTOR) 20 MG tablet TAKE 1 TABLET BY MOUTH EVERY DAY   sacubitril-valsartan (ENTRESTO) 97-103 MG Take 1 tablet by mouth  2 (two) times daily. NEEDS FOLLOW UP APPOINTMENT FOR MORE REFILLS   spironolactone (ALDACTONE) 25 MG tablet TAKE 1/2 TABLET BY MOUTH EVERY DAY   No facility-administered encounter medications on file as of 04/08/2022.    Allergies (verified) Lisinopril, Hydrochlorothiazide, Amlodipine besylate, and Farxiga [dapagliflozin]   History: Past Medical History:  Diagnosis Date   Abnormal myocardial perfusion study 2006   EF 44% ? inferoseptal ischemia. no cath done   Benign neoplasm of cecum    Breast mass, right 03/2008   Cardiomyopathy ([place]) 07/20/2018   Ejection fraction 4045% in September 2019   Centrilobular  emphysema ([place]) 08/25/2018   02/04/2018-high-res CT- no evidence of interstitial lung disease, severe centrilobular emphysema and mild diffuse bronchial wall thickening suggesting COPD, no calcified pleural plaques no pleural effusion, mild smooth bilateral pleural thickening which is nonspecific  02/04/2018-pulmonary function test- FVC 3.37 (71% predicted), postbronchodilator ratio 50, postbronchodilator FEV1 54, mid flow reve   CHF (congestive heart failure) ([place])    COLONIC POLYPS, HX OF 03/18/2007   Qualifier: Diagnosis of  By: [name]    Contact dermatitis 01/31/2013   COPD (chronic obstructive pulmonary disease) ([place])    "dx'd 12/2017"   COPD GOLD II A  08/25/2018   02/04/2018-high-res CT- no evidence of interstitial lung disease, severe centrilobular emphysema and mild diffuse bronchial wall thickening suggesting COPD, no calcified pleural plaques no pleural effusion, mild smooth bilateral pleural thickening which is nonspecific  02/04/2018-pulmonary function test- FVC 3.37 (71% predicted), postbronchodilator ratio 50, postbronchodilator FEV1 54, mid flow reve   Coronary artery disease    Critical aortic valve stenosis 01/12/2018   Calculated valve area less than 0.5 cm mean gradient more than 60 mm of mercury   DEGENERATIVE JOINT DISEASE, BACK 03/18/2007   Qualifier: Diagnosis of  By: [name]    Depressed left ventricular ejection fraction 03/28/2020   Diabetes mellitus ([place]) 07/25/2020   Dyslipidemia 04/20/2020   Dyspnea on exertion 01/12/2018   ERECTILE DYSFUNCTION, ORGANIC 02/10/2009   Qualifier: Diagnosis of  By: [name]    Former cigarette smoker 10/12/2018   Quit in 2012 41-pack-year smoking history   Goals of care, counseling/discussion 08/24/2020   H/O aortic valve replacement 05/05/2020   Heart murmur    HEMORRHOIDS, INTERNAL 03/18/2007   Qualifier: Diagnosis of  By: [name]    HTN (hypertension) 10/21/2007        Hyperlipemia 10/21/2007    Qualifier: Diagnosis of  By: [name]    Hypertension    Hypoglycemia due to insulin 03/26/2020   Low back pain    MYOCARDIAL PERFUSION SCAN, WITH STRESS TEST, ABNORMAL 08/11/2008   Qualifier: Diagnosis of  By: Johnsie Cancel, MD, Rona Ravens    Nocturnal hypoxemia 08/25/2018   04/09/18 >>> ONO - telephone note>>> Patient's ONO on room air showed that he was less than 88% for more than an hour. Patient needs to be on 2 L at HS.  10/06/2018-overnight oximetry-entire duration was 4 hours and 23 minutes, SPO2 less than 88% for 25 minutes and 32 seconds     Oxygen deficiency    uses 2L at night   Paroxysmal atrial fibrillation ([place]) 10/12/2018   PCP NOTES >>>>>>>>>>>>>>>>> 05/14/2020   Polycythemia vera ([place]) 08/04/2020   S/P AVR 03/04/2018   AVR 23 mm Edwards Magna Valve - bioprostetic 03/04/2018 LIMA - LAD   Status post coronary artery bypass graft 03/20/2018   LIMA to LAD during aortic  valve replacement surgery 03/04/2018   Type 2 diabetes mellitus with hyperglycemia, with long-term current use of insulin ([place]) 04/20/2020   Type 2 diabetes mellitus, uncontrolled, with neuropathy 05/20/2008   Qualifier: Diagnosis of  By: [name]    Past Surgical History:  Procedure Laterality Date   ABDOMINAL AORTOGRAM N/A 01/13/2018   Procedure: ABDOMINAL AORTOGRAM;  Surgeon: Troy Sine, MD;  Location: [facility];  Service: Cardiovascular;  Laterality: N/A;   AORTIC VALVE REPLACEMENT N/A 03/04/2018   Procedure: AORTIC VALVE REPLACEMENT (AVR) 80m Edwards Magna Ease Tissue Valve.;  Surgeon: HMelrose Nakayama MD;  [place] [facility]  Service: Open Heart Surgery;  Laterality: N/A;   CARDIAC VALVE REPLACEMENT     COLONOSCOPY W/ POLYPECTOMY     COLONOSCOPY WITH PROPOFOL N/A 02/22/2020   Procedure: COLONOSCOPY WITH PROPOFOL;  Surgeon: DDoran Stabler MD;  [facility]: [facility];  Service: Gastroenterology;  Laterality: N/A;   CORONARY ARTERY BYPASS GRAFT N/A 03/04/2018   Procedure:  CORONARY ARTERY BYPASS GRAFTING (CABG) x1:  LIMA to LAD.;  Surgeon: HMelrose Nakayama MD;  [place] [facility]  Service: Open Heart Surgery;  Laterality: N/A;   HEMOSTASIS CLIP PLACEMENT  02/22/2020   Procedure: HEMOSTASIS CLIP PLACEMENT;  Surgeon: DDoran Stabler MD;  [facility];  Service: Gastroenterology;;   INTRAVASCULAR PRESSURE WIRE/FFR STUDY N/A 05/10/2020   Procedure: INTRAVASCULAR PRESSURE WIRE/FFR STUDY;  Surgeon: CSherren Mocha MD;  [facility];  Service: Cardiovascular;  Laterality: N/A;   LEFT HEART CATH AND CORS/GRAFTS ANGIOGRAPHY N/A 05/10/2020   Procedure: LEFT HEART CATH AND CORS/GRAFTS ANGIOGRAPHY;  Surgeon: CSherren Mocha MD;  [facility];  Service: Cardiovascular;  Laterality: N/A;   LIPOMA EXCISION Left 07/2008    "back of shoulder" Dr TGershon Crane  POLYPECTOMY  02/22/2020   Procedure: POLYPECTOMY;  Surgeon: DDoran Stabler MD;  [facility];  Service: Gastroenterology;;   RIGHT/LEFT HEART CATH AND CORONARY ANGIOGRAPHY N/A 01/13/2018   Procedure: RIGHT/LEFT HEART CATH AND CORONARY ANGIOGRAPHY;  Surgeon: KTroy Sine MD;  [facility];  Service: Cardiovascular;  Laterality: N/A;   TEE WITHOUT CARDIOVERSION N/A 03/04/2018   Procedure: TRANSESOPHAGEAL ECHOCARDIOGRAM (TEE);  Surgeon: HMelrose Nakayama MD;  [facility]  Service: Open Heart Surgery;  Laterality: N/A;   TONSILLECTOMY     WISDOM TOOTH EXTRACTION     Family History  Problem Relation Age of Onset   Melanoma Father 772      deceased secondary to melanoma   Alzheimer's disease Father    Diabetes Father    Hypertension Mother        alive -865  Colon cancer Neg Hx    Esophageal cancer Neg Hx    Colon polyps Neg Hx    Stomach cancer Neg Hx    Social History   Socioeconomic History   Marital status: Married    Spouse name: Not on file   Number of children: Not on file   Years of education: Not on file   Highest  education level: Not on file  Occupational History   Not on file  Tobacco Use   Smoking status: Former    Packs/day: 1.50    Years: 41.00    Total pack years: 61.50    Types: Cigarettes    Quit date: 08/26/2010    Years since quitting: 11.6   Smokeless tobacco: Never   Tobacco comments:  Quit 2012  Vaping Use   Vaping Use: Never used  Substance and Sexual Activity   Alcohol use: Yes    Comment: occasionally   Drug use: Not Currently   Sexual activity: Not Currently  Other Topics Concern   Not on file  Social History Narrative   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   Married    Former Smoker -  33 pack year history   Alcohol use-no     Drug use-no             Social Determinants of Radio broadcast assistant Strain: Low Risk  (03/30/2021)   Overall Financial Resource Strain (CARDIA)    Difficulty of Paying Living Expenses: Not hard at all  Food Insecurity: No Food Insecurity (03/30/2021)   Hunger Vital Sign    Worried About Running Out of Food in the Last Year: Never true    Oracle in the Last Year: Never true  Transportation Needs: No Transportation Needs (03/30/2021)   PRAPARE - Hydrologist (Medical): No    Lack of Transportation (Non-Medical): No  Physical Activity: Insufficiently Active (03/30/2021)   Exercise Vital Sign    Days of Exercise per Week: 2 days    Minutes of Exercise per Session: 60 min  Stress: No Stress Concern Present (03/30/2021)   [place]    Feeling of Stress : Not at all  Social Connections: Moderately Integrated (03/30/2021)   Social Connection and Isolation Panel [NHANES]    Frequency of Communication with Friends and Family: More than three times a week    Frequency of Social Gatherings with Friends and Family: More than three times a week    Attends Religious Services: More than 4 times per year    Active Member of [employer] or Organizations: No     Attends Archivist Meetings: Never    Marital Status: Married    Tobacco Counseling Counseling given: Not Answered Tobacco comments: Quit 2012   Clinical Intake:  Pre-visit preparation completed: Yes  Pain : No/denies pain Pain Score: 0-No pain     BMI - recorded: 21.1 Nutritional Risks: None Diabetes: Yes CBG done?: No Did pt. bring in CBG monitor from home?: No  How often do you need to have someone help you when you read instructions, pamphlets, or other written materials from your doctor or pharmacy?: 1 - Never  Diabetic?yes Nutrition Risk Assessment:  Has the patient had any N/V/D within the last 2 months?  No  Does the patient have any non-healing wounds?  No  Has the patient had any unintentional weight loss or weight gain?  No   Diabetes:  Is the patient diabetic?  Yes  If diabetic, was a CBG obtained today?  No  Did the patient bring in their glucometer from home?  No  How often do you monitor your CBG's? Twice daily.   Financial Strains and Diabetes Management:  Are you having any financial strains with the device, your supplies or your medication? No .  Does the patient want to be seen by Chronic Care Management for management of their diabetes?  No  Would the patient like to be referred to a Nutritionist or for Diabetic Management?  No   Diabetic Exams:  Diabetic Eye Exam: Completed 09/17/21 Diabetic Foot Exam: Completed 06/04/21    Interpreter Needed?: No  Information entered by :: Yariela Tison   Activities of Daily Living  04/08/2022    8:29 AM  In your present state of health, do you have any difficulty performing the following activities:  Hearing? 1  Vision? 0  Difficulty concentrating or making decisions? 0  Walking or climbing stairs? 0  Dressing or bathing? 0  Doing errands, shopping? 0  Preparing Food and eating ? N  Using the Toilet? N  In the past six months, have you accidently leaked urine? N  Do you have  problems with loss of bowel control? N  Managing your Medications? N  Managing your Finances? N  Housekeeping or managing your Housekeeping? N    Patient Care Team: Colon Branch, MD as PCP - General (Internal Medicine) Berniece Salines, DO as PCP - Cardiology (Cardiology) Philemon Kingdom, MD as Consulting Physician (Internal Medicine) Spero Geralds, MD as Consulting Physician (Pulmonary Disease) Colon Branch, MD as Consulting Physician (Internal Medicine)  Indicate any recent Medical Services you may have received from other than [facility] providers in the past year (date may be approximate).     Assessment:   This is a routine wellness examination for [name].  Hearing/Vision screen No results found.  Dietary issues and exercise activities discussed: Current Exercise Habits: Home exercise routine, Type of exercise: walking, Time (Minutes): 30, Frequency (Times/Week): 3, Weekly Exercise (Minutes/Week): 90, Intensity: Mild, Exercise limited by: None identified   Goals Addressed             This Visit's Progress    Increase physical activity   On track    Do bike 15 min/ 2 days per week.     Patient Stated   On track    Drink more water & eat healthier       Depression Screen    04/08/2022    8:26 AM 12/03/2021   10:07 AM 06/04/2021    9:46 AM 03/30/2021    8:30 AM 09/13/2020    9:54 AM 03/23/2020   10:40 AM 03/22/2019    9:10 AM  PHQ 2/9 Scores  PHQ - 2 Score 0 0 0 0 0 0 0    Fall Risk    04/08/2022    8:26 AM 12/03/2021   10:07 AM 06/04/2021    9:46 AM 03/30/2021    8:29 AM 09/13/2020    9:54 AM  Fall Risk   Falls in the past year? 0 0 0 0 0  Number falls in past yr: 0 0 0 0 0  Injury with Fall? 0 0 0 0 0  Risk for fall due to : No Fall Risks      Follow up Falls evaluation completed  Falls evaluation completed Falls prevention discussed     FALL RISK PREVENTION PERTAINING TO THE HOME:  Any stairs in or around the home? No  If so, are there any without handrails?   N/A Home free of loose throw rugs in walkways, pet beds, electrical cords, etc? Yes  Adequate lighting in your home to reduce risk of falls? Yes   ASSISTIVE DEVICES UTILIZED TO PREVENT FALLS:  Life alert? No  Use of a cane, walker or w/c? No  Grab bars in the bathroom? Yes  Shower chair or bench in shower? No  Elevated toilet seat or a handicapped toilet? No   TIMED UP AND GO:  Was the test performed? No .    Cognitive Function: Cognitive status appears stable by direct observation by this Nurse Health Advisor. No abnormalities found."        Immunizations  Immunization History  Administered Date(s) Administered   Fluad Quad(high Dose 65+) 05/26/2019, 05/12/2020, 06/04/2021   Influenza Split 08/04/2012   Influenza Whole 05/20/2008, 07/12/2009, 05/02/2010   Influenza, High Dose Seasonal PF 05/20/2017, 06/10/2018   Influenza,inj,Quad PF,6+ Mos 05/07/2013, 05/09/2014, 06/07/2015, 05/01/2016   PFIZER(Purple Top)SARS-COV-2 Vaccination 10/08/2019, 11/16/2019, 12/14/2019, 06/29/2020   Pneumococcal Conjugate-13 02/05/2017   Pneumococcal Polysaccharide-23 05/20/2008, 08/25/2018   Td 08/26/2008    TDAP status: Due, Education has been provided regarding the importance of this vaccine. Advised may receive this vaccine at local pharmacy or Health Dept. Aware to provide a copy of the vaccination record if obtained from local pharmacy or Health Dept. Verbalized acceptance and understanding.  Flu Vaccine status: Up to date  Pneumococcal vaccine status: Up to date  Covid-19 vaccine status: Information provided on how to obtain vaccines.   Qualifies for Shingles Vaccine? Yes   Zostavax completed No   Shingrix Completed?: No.    Education has been provided regarding the importance of this vaccine. Patient has been advised to call insurance company to determine out of pocket expense if they have not yet received this vaccine. Advised may also receive vaccine at local pharmacy or Health Dept.  Verbalized acceptance and understanding.  Screening Tests Health Maintenance  Topic Date Due   Zoster Vaccines- Shingrix (1 of 2) Never done   TETANUS/TDAP  10/10/2018   COVID-19 Vaccine (5 - Pfizer risk series) 08/24/2020   HEMOGLOBIN A1C  12/24/2021   INFLUENZA VACCINE  03/26/2022   FOOT EXAM  06/04/2022   OPHTHALMOLOGY EXAM  09/17/2022   COLONOSCOPY (Pts 45-33yr Insurance coverage will need to be confirmed)  03/04/2023   Pneumonia Vaccine 71 Years old  Completed   Hepatitis C Screening  Completed   HPV VACCINES  Aged Out    Health Maintenance  Health Maintenance Due  Topic Date Due   Zoster Vaccines- Shingrix (1 of 2) Never done   TETANUS/TDAP  10/10/2018   COVID-19 Vaccine (5 - Pfizer risk series) 08/24/2020   HEMOGLOBIN A1C  12/24/2021   INFLUENZA VACCINE  03/26/2022    Colorectal cancer screening: Type of screening: Colonoscopy. Completed 03/03/20. Repeat every 3 years  Lung Cancer Screening: (Low Dose CT Chest recommended if Age 71-80years, 30 pack-year currently smoking OR have quit w/in 15years.) does qualify.   Lung Cancer Screening Referral: last done 08/22/21  Additional Screening:  Hepatitis C Screening: does qualify; Completed 09/08/18  Vision Screening: Recommended annual ophthalmology exams for early detection of glaucoma and other disorders of the eye. Is the patient up to date with their annual eye exam?  Yes  Who is the provider or what is the name of the office in which the patient attends annual eye exams? [facility] If pt is not established with a provider, would they like to be referred to a provider to establish care? No .   Dental Screening: Recommended annual dental exams for proper oral hygiene  Community Resource Referral / Chronic Care Management: CRR required this visit?  No   CCM required this visit?  No      Plan:     I have personally reviewed and noted the following in the patient's chart:   Medical and social  history Use of alcohol, tobacco or illicit drugs  Current medications and supplements including opioid prescriptions. Patient is not currently taking opioid prescriptions. Functional ability and status Nutritional status Physical activity Advanced directives List of other physicians Hospitalizations, surgeries, and ER visits in previous 12 months Vitals  Screenings to include cognitive, depression, and falls Referrals and appointments  In addition, I have reviewed and discussed with patient certain preventive protocols, quality metrics, and best practice recommendations. A written personalized care plan for preventive services as well as general preventive health recommendations were provided to patient.   Due to this being a telephonic visit, the after visit summary with patients personalized plan was offered to patient via mail or my-chart. Patient would like to access on my-chart.   Duard Brady Attallah Ontko, [place]   04/08/2022   Nurse Notes:   I have reviewed and agree with Health Coaches documentation.  Kathlene November, MD

## 2022-05-03 ENCOUNTER — Other Ambulatory Visit (HOSPITAL_COMMUNITY): Payer: Self-pay | Admitting: Internal Medicine

## 2022-05-13 ENCOUNTER — Encounter: Payer: Self-pay | Admitting: Internal Medicine

## 2022-05-13 ENCOUNTER — Ambulatory Visit: Payer: Medicare PPO | Admitting: Internal Medicine

## 2022-05-13 VITALS — BP 122/78 | HR 77 | Ht 76.0 in | Wt 174.0 lb

## 2022-05-13 DIAGNOSIS — Z794 Long term (current) use of insulin: Secondary | ICD-10-CM

## 2022-05-13 DIAGNOSIS — E1165 Type 2 diabetes mellitus with hyperglycemia: Secondary | ICD-10-CM

## 2022-05-13 LAB — POCT GLYCOSYLATED HEMOGLOBIN (HGB A1C): Hemoglobin A1C: 7.5 % — AB (ref 4.0–5.6)

## 2022-05-13 MED ORDER — LEVEMIR FLEXPEN 100 UNIT/ML ~~LOC~~ SOPN
18.0000 [IU] | PEN_INJECTOR | Freq: Every day | SUBCUTANEOUS | 6 refills | Status: DC
Start: 2022-05-13 — End: 2022-08-23

## 2022-05-13 MED ORDER — NOVOLOG FLEXPEN 100 UNIT/ML ~~LOC~~ SOPN: PEN_INJECTOR | SUBCUTANEOUS | 4 refills | Status: DC

## 2022-05-13 MED ORDER — BD PEN NEEDLE NANO 2ND GEN 32G X 4 MM MISC: 1.0000 | Freq: Four times a day (QID) | 3 refills | Status: DC

## 2022-05-13 MED ORDER — METFORMIN HCL 1000 MG PO TABS: 1000.0000 mg | ORAL_TABLET | Freq: Two times a day (BID) | ORAL | 3 refills | Status: DC

## 2022-05-13 NOTE — Progress Notes (Signed)
Name: [name]  Age/ Sex: 71 y.o., male   MRN/ DOB: [id number], 1951/05/06     PCP: Colon Branch, MD   Reason for Endocrinology Evaluation: Type 2 Diabetes Mellitus  Initial Endocrine Consultative Visit: 04/19/2020    PATIENT IDENTIFIER: [name] is a 71 y.o. male with a past medical history of COPD, non-obstructive CAD, severe AS ( S/P AVR/CABG), CHF, HTN , T2DM and OSA. The patient has followed with Endocrinology clinic since 04/19/2020 for consultative assistance with management of his diabetes.  DIABETIC HISTORY:  [name] was diagnosed with DM in 2009. He has been on metformin and insulin for years.Metfromin dose was reduced by 50% due to hypoglycemia per pt.  His hemoglobin A1c has ranged from 7.2% in 2021, peaking at 8.8%in 2018  On his initial visit to our clinic his A1c was 7.2 % We adjusted MDI regimen and continued metformin   Due to a low BMI of 20.99 we checked GAD-65 and islet cell Ab's which were negative    Prandial insulin stopped 10/2020 but by his 01/2021 he was already back on it and we continued   Wilder Glade stopped due to genital rash 01/2021  SUBJECTIVE:   During the last visit (06/26/2021)): A1c 7.5 %   Today (05/13/2022): [name] is here for a follow up on diabetes. He checks his blood sugars a 2 times daily. The patient has not  had hypoglycemic episodes since the last clinic visit.    Continues to follow with hematology for polycythemia vera   He eats two meals a day   Denies nausea, vomiting or diarrhea   HOME DIABETES REGIMEN:  Levemir 18 units daily  Metformin 1000 mg , BID Novolog 8 units with each meal Novolog 3 units with a snack  OH:YWVPXTG (BG- 135/40)     Statin: yes ACE-I/ARB: yes   METER DOWNLOAD SUMMARY: Prodigy unable to download    89- '168mg'$ /dL   DIABETIC COMPLICATIONS: Microvascular complications:  Retinopathy Denies: CKD , neuropathy  Last eye exam: Completed 2023   Macrovascular  complications:  CAD Denies: PVD, CVA   HISTORY:  Past Medical History:  Past Medical History:  Diagnosis Date   Abnormal myocardial perfusion study 2006   EF 44% ? inferoseptal ischemia. no cath done   Benign neoplasm of cecum    Breast mass, right 03/2008   Cardiomyopathy ([place]) 07/20/2018   Ejection fraction 4045% in September 2019   Centrilobular emphysema ([place]) 08/25/2018   02/04/2018-high-res CT- no evidence of interstitial lung disease, severe centrilobular emphysema and mild diffuse bronchial wall thickening suggesting COPD, no calcified pleural plaques no pleural effusion, mild smooth bilateral pleural thickening which is nonspecific  02/04/2018-pulmonary function test- FVC 3.37 (71% predicted), postbronchodilator ratio 50, postbronchodilator FEV1 54, mid flow reve   CHF (congestive heart failure) ([place])    COLONIC POLYPS, HX OF 03/18/2007   Qualifier: Diagnosis of  By: [name]    Contact dermatitis 01/31/2013   COPD (chronic obstructive pulmonary disease) ([place])    "dx'd 12/2017"   COPD GOLD II A  08/25/2018   02/04/2018-high-res CT- no evidence of interstitial lung disease, severe centrilobular emphysema and mild diffuse bronchial wall thickening suggesting COPD, no calcified pleural plaques no pleural effusion, mild smooth bilateral pleural thickening which is nonspecific  02/04/2018-pulmonary function test- FVC 3.37 (71% predicted), postbronchodilator ratio 50, postbronchodilator FEV1 54, mid flow reve   Coronary artery disease    Critical aortic valve stenosis 01/12/2018   Calculated valve  area less than 0.5 cm mean gradient more than 60 mm of mercury   DEGENERATIVE JOINT DISEASE, BACK 03/18/2007   Qualifier: Diagnosis of  By: [name]    Depressed left ventricular ejection fraction 03/28/2020   Diabetes mellitus ([place]) 07/25/2020   Dyslipidemia 04/20/2020   Dyspnea on exertion 01/12/2018   ERECTILE DYSFUNCTION, ORGANIC 02/10/2009   Qualifier: Diagnosis of  By: [name]    Former cigarette smoker 10/12/2018   Quit in 2012 41-pack-year smoking history   Goals of care, counseling/discussion 08/24/2020   H/O aortic valve replacement 05/05/2020   Heart murmur    HEMORRHOIDS, INTERNAL 03/18/2007   Qualifier: Diagnosis of  By: [name]    HTN (hypertension) 10/21/2007        Hyperlipemia 10/21/2007   Qualifier: Diagnosis of  By: [name]    Hypertension    Hypoglycemia due to insulin 03/26/2020   Low back pain    MYOCARDIAL PERFUSION SCAN, WITH STRESS TEST, ABNORMAL 08/11/2008   Qualifier: Diagnosis of  By: Johnsie Cancel, MD, Rona Ravens    Nocturnal hypoxemia 08/25/2018   04/09/18 >>> ONO - telephone note>>> Patient's ONO on room air showed that he was less than 88% for more than an hour. Patient needs to be on 2 L at HS.  10/06/2018-overnight oximetry-entire duration was 4 hours and 23 minutes, SPO2 less than 88% for 25 minutes and 32 seconds     Oxygen deficiency    uses 2L at night   Paroxysmal atrial fibrillation ([place]) 10/12/2018   PCP NOTES >>>>>>>>>>>>>>>>> 05/14/2020   Polycythemia vera ([place]) 08/04/2020   S/P AVR 03/04/2018   AVR 23 mm Edwards Magna Valve - bioprostetic 03/04/2018 LIMA - LAD   Status post coronary artery bypass graft 03/20/2018   LIMA to LAD during aortic valve replacement surgery 03/04/2018   Type 2 diabetes mellitus with hyperglycemia, with long-term current use of insulin ([place]) 04/20/2020   Type 2 diabetes mellitus, uncontrolled, with neuropathy 05/20/2008   Qualifier: Diagnosis of  By: [name]    Past Surgical History:  Past Surgical History:  Procedure Laterality Date   ABDOMINAL AORTOGRAM N/A 01/13/2018   Procedure: ABDOMINAL AORTOGRAM;  Surgeon: Troy Sine, MD;  Location: [facility];  Service: Cardiovascular;  Laterality: N/A;   AORTIC VALVE REPLACEMENT N/A 03/04/2018   Procedure: AORTIC VALVE REPLACEMENT (AVR) 59m Edwards Magna Ease Tissue Valve.;  Surgeon: HMelrose Nakayama MD;   Location: [facility]  Service: Open Heart Surgery;  Laterality: N/A;   CARDIAC VALVE REPLACEMENT     COLONOSCOPY W/ POLYPECTOMY     COLONOSCOPY WITH PROPOFOL N/A 02/22/2020   Procedure: COLONOSCOPY WITH PROPOFOL;  Surgeon: DDoran Stabler MD;  [facility];  Service: Gastroenterology;  Laterality: N/A;   CORONARY ARTERY BYPASS GRAFT N/A 03/04/2018   Procedure: CORONARY ARTERY BYPASS GRAFTING (CABG) x1:  LIMA to LAD.;  Surgeon: HMelrose Nakayama MD;  [facility]  Service: Open Heart Surgery;  Laterality: N/A;   HEMOSTASIS CLIP PLACEMENT  02/22/2020   Procedure: HEMOSTASIS CLIP PLACEMENT;  Surgeon: DDoran Stabler MD;  [facility];  Service: Gastroenterology;;   INTRAVASCULAR PRESSURE WIRE/FFR STUDY N/A 05/10/2020   Procedure: INTRAVASCULAR PRESSURE WIRE/FFR STUDY;  Surgeon: CSherren Mocha MD;  [facility];  Service: Cardiovascular;  Laterality: N/A;   LEFT HEART CATH AND CORS/GRAFTS ANGIOGRAPHY N/A 05/10/2020   Procedure: LEFT HEART CATH AND CORS/GRAFTS  ANGIOGRAPHY;  Surgeon: Sherren Mocha, MD;  Location: [facility];  Service: Cardiovascular;  Laterality: N/A;   LIPOMA EXCISION Left 07/2008    "back of shoulder" Dr Gershon Crane   POLYPECTOMY  02/22/2020   Procedure: POLYPECTOMY;  Surgeon: Doran Stabler, MD;  [facility];  Service: Gastroenterology;;   RIGHT/LEFT HEART CATH AND CORONARY ANGIOGRAPHY N/A 01/13/2018   Procedure: RIGHT/LEFT HEART CATH AND CORONARY ANGIOGRAPHY;  Surgeon: Troy Sine, MD;  Location: [facility];  Service: Cardiovascular;  Laterality: N/A;   TEE WITHOUT CARDIOVERSION N/A 03/04/2018   Procedure: TRANSESOPHAGEAL ECHOCARDIOGRAM (TEE);  Surgeon: Melrose Nakayama, MD;  Location: [facility];  Service: Open Heart Surgery;  Laterality: N/A;   TONSILLECTOMY     WISDOM TOOTH EXTRACTION     Social History:  reports that he quit smoking about 11 years ago. His smoking use included cigarettes. He has a  61.50 pack-year smoking history. He has never used smokeless tobacco. He reports current alcohol use. He reports that he does not currently use drugs. Family History:  Family History  Problem Relation Age of Onset   Melanoma Father 48       deceased secondary to melanoma   Alzheimer's disease Father    Diabetes Father    Hypertension Mother        alive -47   Colon cancer Neg Hx    Esophageal cancer Neg Hx    Colon polyps Neg Hx    Stomach cancer Neg Hx      HOME MEDICATIONS: Allergies as of 05/13/2022       Reactions   Lisinopril Other (See Comments)   Hyperkalemia   Hydrochlorothiazide Hives   Amlodipine Besylate Other (See Comments)   REACTION: ? caused left axillary nodules/chest flutter   Farxiga [dapagliflozin] Rash, Other (See Comments)   "swollen penis"        Medication List        Accurate as of May 13, 2022 12:22 PM. If you have any questions, ask your nurse or doctor.          acetaminophen 500 MG tablet Commonly known as: TYLENOL Take 1,000 mg by mouth every 6 (six) hours as needed for moderate pain or headache.   amoxicillin 500 MG capsule Commonly known as: AMOXIL TAKE 4 CAPSULES 1 HOUR BEFORE DENTAL PROCEDURE. What changed: Another medication with the same name was removed. Continue taking this medication, and follow the directions you see here. Changed by: Dorita Sciara, MD   aspirin EC 325 MG tablet Take 1 tablet (325 mg total) by mouth daily.   BD Pen Needle Nano 2nd Gen 32G X 4 MM Misc Generic drug: Insulin Pen Needle Inject 1 Device into the skin in the morning, at noon, in the evening, and at bedtime.   cetirizine 10 MG tablet Commonly known as: ZYRTEC Take 10 mg by mouth daily as needed for allergies.   Entresto 97-103 MG Generic drug: sacubitril-valsartan TAKE 1 TABLET BY MOUTH 2 (TWO) TIMES DAILY. NEEDS FOLLOW UP APPOINTMENT FOR MORE REFILLS   glucose blood test strip Commonly known as: Prodigy No Coding Blood  Gluc Use as instructed to check blood sugar twice a day.  DX  E11.40   hydrALAZINE 25 MG tablet Commonly known as: APRESOLINE Take 1 tablet (25 mg total) by mouth 3 (three) times daily.   isosorbide mononitrate 30 MG 24 hr tablet Commonly known as: IMDUR Take 15 mg by mouth daily.   Levemir FlexPen 100  UNIT/ML FlexPen Generic drug: insulin detemir INJECT 18 UNITS INTO THE SKIN EVERY MORNING.   metFORMIN 1000 MG tablet Commonly known as: GLUCOPHAGE TAKE 1 TABLET BY MOUTH 2 TIMES DAILY WITH A MEAL   metoprolol succinate 50 MG 24 hr tablet Commonly known as: TOPROL-XL TAKE 1 TABLET BY MOUTH DAILY. TAKE WITH OR IMMEDIATELY FOLLOWING A MEAL.   NovoLOG FlexPen 100 UNIT/ML FlexPen Generic drug: insulin aspart Inject 8 Units into the skin with breakfast, with lunch, and with evening meal. What changed: additional instructions   rosuvastatin 20 MG tablet Commonly known as: CRESTOR TAKE 1 TABLET BY MOUTH EVERY DAY   spironolactone 25 MG tablet Commonly known as: ALDACTONE TAKE 1/2 TABLET BY MOUTH EVERY DAY   VITAMIN B-12 PO Take 1 tablet by mouth daily.         OBJECTIVE:   Vital Signs: BP 122/78 (BP Location: Left Arm, Patient Position: Sitting, Cuff Size: Small)   Pulse 77   Ht '6\' 4"'$  (1.93 m)   Wt 174 lb (78.9 kg)   SpO2 94%   BMI 21.18 kg/m   Wt Readings from Last 3 Encounters:  05/13/22 174 lb (78.9 kg)  03/29/22 172 lb (78 kg)  12/03/21 173 lb 6 oz (78.6 kg)     Exam: General: Pt appears well and is in NAD  Lungs: Clear with good BS bilat  Heart: RRR   Extremities: No pretibial edema.   Neuro: MS is good with appropriate affect, pt is alert and Ox3       DM Foot Exam 05/13/2022  The skin of the feet is intact without sores or ulcerations. The pedal pulses are 1+ on right and 1+ on left. The sensation is intact to a screening 5.07, 10 gram monofilament bilaterally    DATA REVIEWED:  Lab Results  Component Value Date   HGBA1C 7.5 (A) 05/13/2022    HGBA1C 7.5 (A) 06/26/2021   HGBA1C 7.4 (A) 02/13/2021    Latest Reference Range & Units 03/29/22 13:07  Sodium 135 - 145 mmol/L 138  Potassium 3.5 - 5.1 mmol/L 4.4  Chloride 98 - 111 mmol/L 103  CO2 22 - 32 mmol/L 27  Glucose 70 - 99 mg/dL 182 (H)  BUN 8 - 23 mg/dL 13  Creatinine 0.61 - 1.24 mg/dL 0.94  Calcium 8.9 - 10.3 mg/dL 9.9  Anion gap 5 - 15  8  Alkaline Phosphatase 38 - 126 U/L 42  Albumin 3.5 - 5.0 g/dL 4.6  AST 15 - 41 U/L 12 (L)  ALT 0 - 44 U/L 7  Total Protein 6.5 - 8.1 g/dL 7.3  Total Bilirubin 0.3 - 1.2 mg/dL 0.5  GFR, Est Non African American >60 mL/min >60    ISLET CELL ANTIBODY SCREEN NEGATIVE NEGATIVE    Glutamic Acid Decarb Ab <5 IU/mL <5      ASSESSMENT / PLAN / RECOMMENDATIONS:   1) Type 2 Diabetes Mellitus, Sub- Optimally controlled, With macrovascular  complications - Most recent A1c of 7.5 %. Goal A1c < 7.0 %.     -A1c remains stable but above goal despite increasing NovoLog dose - He had to stop the [place] due to severe genital infection/irritation - He declined CGM technology as he is concerned he will knock it off with the type of the work he does, I do believe this technology will help improve his glycemic control but he again declines today - He had declined GLP-1 agonists therapy  - He is comfortable using insulin and is not  having side effects from it  and does not wish to change it -Emphasized the importance of taking 3 units of NovoLog with a snack, and 8 units with a proper meal -He was encouraged to use correction factor before each meal   MEDICATIONS:  - Continue Metformin 1000 mg, 1 tablet BID - Continue  Levemir  18 units daily  -Continue Novolog 8 units with each meal  - IE:[id number] (BG- 135/40)  EDUCATION / INSTRUCTIONS: BG monitoring instructions: Patient is instructed to check his blood sugars 3 times a day, before meals  Call [facility] if: BG persistently < 100  I reviewed the Rule of 15 for the  treatment of hypoglycemia in detail with the patient. Literature supplied.     2) Diabetic complications:  Eye: Does not have known diabetic retinopathy.  Neuro/ Feet: Does not have known diabetic peripheral neuropathy .  Renal: Patient does not have known baseline CKD. He   is  on an ACEI/ARB at present.   F/U in 6 months    Signed electronically by: Mack Guise, MD  [facility]  [facility] [address] [place], [place] [address] Phone: [phone] FAX: [phone]   CC: Colon Branch, [address] [place] [address] Phone: [phone]  Fax: [phone]  Return to Endocrinology clinic as below: Future Appointments  Date Time Provider [place]  05/15/2022 10:00 AM Tobb, Godfrey Pick, DO [facility] None  06/05/2022 10:00 AM Colon Branch, MD [facility]  09/30/2022  1:15 PM [facility] None  09/30/2022  1:30 PM Celso Amy, NP [facility] None  09/30/2022  2:00 PM [facility] A1 [facility] None  04/10/2023  8:20 AM [facility] HEALTH COACH [facility]

## 2022-05-13 NOTE — Patient Instructions (Addendum)
-   Continue  Metformin 1000 mg, 1 tablet TWICE a day  - Continue  Levemir 18 units daily  - Continue   NOVOLOG 8 units with each meal  - Novolog 3 units with snacks  - Novlog correctional insulin: ADD extra units on insulin to your meal-time Novolog  dose if your blood sugars are higher than 185. Use the scale below to help guide you:    Blood sugar before meal Number of units to inject  Less than 185 0 unit  186 - 225 1 units  226 -  265 2 units  265 -  305 3 units  306 -  345 4 units       of carbohydrates.

## 2022-05-15 ENCOUNTER — Encounter: Payer: Self-pay | Admitting: Cardiology

## 2022-05-15 ENCOUNTER — Ambulatory Visit: Payer: Medicare PPO | Attending: Cardiology | Admitting: Cardiology

## 2022-05-15 VITALS — BP 132/70 | HR 72 | Ht 76.0 in | Wt 174.6 lb

## 2022-05-15 DIAGNOSIS — Z794 Long term (current) use of insulin: Secondary | ICD-10-CM | POA: Diagnosis not present

## 2022-05-15 DIAGNOSIS — E785 Hyperlipidemia, unspecified: Secondary | ICD-10-CM | POA: Diagnosis not present

## 2022-05-15 DIAGNOSIS — R0989 Other specified symptoms and signs involving the circulatory and respiratory systems: Secondary | ICD-10-CM

## 2022-05-15 DIAGNOSIS — I255 Ischemic cardiomyopathy: Secondary | ICD-10-CM | POA: Diagnosis not present

## 2022-05-15 DIAGNOSIS — I10 Essential (primary) hypertension: Secondary | ICD-10-CM | POA: Diagnosis not present

## 2022-05-15 DIAGNOSIS — Z952 Presence of prosthetic heart valve: Secondary | ICD-10-CM | POA: Diagnosis not present

## 2022-05-15 DIAGNOSIS — Z951 Presence of aortocoronary bypass graft: Secondary | ICD-10-CM

## 2022-05-15 DIAGNOSIS — E1165 Type 2 diabetes mellitus with hyperglycemia: Secondary | ICD-10-CM

## 2022-05-15 NOTE — Patient Instructions (Signed)
Medication Instructions:  Your physician recommends that you continue on your current medications as directed. Please refer to the Current Medication list given to you today.  *If you need a refill on your cardiac medications before your next appointment, please call your pharmacy*   Lab Work: None   Testing/Procedures: Your physician has requested that you have an echocardiogram. Echocardiography is a painless test that uses sound waves to create images of your heart. It provides your doctor with information about the size and shape of your heart and how well your heart's chambers and valves are working. This procedure takes approximately one hour. There are no restrictions for this procedure.    Follow-Up: At [facility], you and your health needs are our priority.  As part of our continuing mission to provide you with exceptional heart care, we have created designated Provider Care Teams.  These Care Teams include your primary Cardiologist (physician) and Advanced Practice Providers (APPs -  Physician Assistants and Nurse Practitioners) who all work together to provide you with the care you need, when you need it.  We recommend signing up for the patient portal called "MyChart".  Sign up information is provided on this After Visit Summary.  MyChart is used to connect with patients for Virtual Visits (Telemedicine).  Patients are able to view lab/test results, encounter notes, upcoming appointments, etc.  Non-urgent messages can be sent to your provider as well.   To learn more about what you can do with MyChart, go to [email or web address].    Your next appointment:   6 month(s)  The format for your next appointment:   In Person  Provider:   Berniece Salines, DO     Other Instructions   Important Information About Sugar

## 2022-05-15 NOTE — Progress Notes (Signed)
Cardiology Office Note:    Date:  05/15/2022   ID:  [name], DOB [birth year], MRN [id number]  PCP:  Colon Branch, MD  Cardiologist:  Berniece Salines, DO  Electrophysiologist:  None   Referring MD: Colon Branch, MD   " I am doing fine"  History of Present Illness:    [name] is a 71 y.o. male with a hx of  coronary artery disease status post CABG in 2019, ischemic cardiomyopathy most recent EF on March 27, 2020 at which time his EF was reported to be 25 to 30%, Status post aortic valve replacement with a 23 Edwards valve also in July 2019, hypertension hyperlipidemia and diabetes.  Polycythemia vera and is undergoing phlebotomy.   I saw the patient on April 05, 2020 at that time we kept him on his losartan as well as his beta-blocker and added Aldactone.    When I saw the patient 1 May 05, 2020 he still with experiencing significant fatigue and shortness of breath.  Given his history of  CAD I recommended he undergo a left heart catheterization to make sure that worsening/progression of CAD was not causing symptoms.  He did undergo left heart catheterization there is no need for a stent placement.   I saw the patient on August 14, 2020 at that time we will continue his beta-blocker, increase his Entresto to 49-51 twice daily, continue Aldactone 12.5, Hydralazine 10 mg 3 times daily and Farxiga 10 mg daily.  We discussed his cardiac MRI which show EF of 25%.   I saw the patient in March 2020 we discussed his left heart catheterization results.  With a concern for we will continue his medical therapy and was pending his echocardiogram.  His visit on May 30, 2021 at that time he was hypertensive so I increase his hydralazine to 25 mg 3 times a day.  We talked about his improved EF.  I saw the patient March 2023, at that time he was doing well clinically from a cardiovascular standpoint.  We will plan a repeat echocardiogram for his next visit.  He is here today for  follow-up visit.    Past Medical History:  Diagnosis Date   Abnormal myocardial perfusion study 2006   EF 44% ? inferoseptal ischemia. no cath done   Benign neoplasm of cecum    Breast mass, right 03/2008   Cardiomyopathy ([place]) 07/20/2018   Ejection fraction 4045% in September 2019   Centrilobular emphysema ([place]) 08/25/2018   02/04/2018-high-res CT- no evidence of interstitial lung disease, severe centrilobular emphysema and mild diffuse bronchial wall thickening suggesting COPD, no calcified pleural plaques no pleural effusion, mild smooth bilateral pleural thickening which is nonspecific  02/04/2018-pulmonary function test- FVC 3.37 (71% predicted), postbronchodilator ratio 50, postbronchodilator FEV1 54, mid flow reve   CHF (congestive heart failure) ([place])    COLONIC POLYPS, HX OF 03/18/2007   Qualifier: Diagnosis of  By: [name]    Contact dermatitis 01/31/2013   COPD (chronic obstructive pulmonary disease) ([place])    "dx'd 12/2017"   COPD GOLD II A  08/25/2018   02/04/2018-high-res CT- no evidence of interstitial lung disease, severe centrilobular emphysema and mild diffuse bronchial wall thickening suggesting COPD, no calcified pleural plaques no pleural effusion, mild smooth bilateral pleural thickening which is nonspecific  02/04/2018-pulmonary function test- FVC 3.37 (71% predicted), postbronchodilator ratio 50, postbronchodilator FEV1 54, mid flow reve   Coronary artery disease    Critical aortic valve stenosis 01/12/2018  Calculated valve area less than 0.5 cm mean gradient more than 60 mm of mercury   DEGENERATIVE JOINT DISEASE, BACK 03/18/2007   Qualifier: Diagnosis of  By: [name]    Depressed left ventricular ejection fraction 03/28/2020   Diabetes mellitus ([place]) 07/25/2020   Dyslipidemia 04/20/2020   Dyspnea on exertion 01/12/2018   ERECTILE DYSFUNCTION, ORGANIC 02/10/2009   Qualifier: Diagnosis of  By: [name]    Former cigarette smoker 10/12/2018    Quit in 2012 41-pack-year smoking history   Goals of care, counseling/discussion 08/24/2020   H/O aortic valve replacement 05/05/2020   Heart murmur    HEMORRHOIDS, INTERNAL 03/18/2007   Qualifier: Diagnosis of  By: [name]    HTN (hypertension) 10/21/2007        Hyperlipemia 10/21/2007   Qualifier: Diagnosis of  By: [name]    Hypertension    Hypoglycemia due to insulin 03/26/2020   Low back pain    MYOCARDIAL PERFUSION SCAN, WITH STRESS TEST, ABNORMAL 08/11/2008   Qualifier: Diagnosis of  By: Johnsie Cancel, MD, Rona Ravens    Nocturnal hypoxemia 08/25/2018   04/09/18 >>> ONO - telephone note>>> Patient's ONO on room air showed that he was less than 88% for more than an hour. Patient needs to be on 2 L at HS.  10/06/2018-overnight oximetry-entire duration was 4 hours and 23 minutes, SPO2 less than 88% for 25 minutes and 32 seconds     Oxygen deficiency    uses 2L at night   Paroxysmal atrial fibrillation ([place]) 10/12/2018   PCP NOTES >>>>>>>>>>>>>>>>> 05/14/2020   Polycythemia vera ([place]) 08/04/2020   S/P AVR 03/04/2018   AVR 23 mm Edwards Magna Valve - bioprostetic 03/04/2018 LIMA - LAD   Status post coronary artery bypass graft 03/20/2018   LIMA to LAD during aortic valve replacement surgery 03/04/2018   Type 2 diabetes mellitus with hyperglycemia, with long-term current use of insulin ([place]) 04/20/2020   Type 2 diabetes mellitus, uncontrolled, with neuropathy 05/20/2008   Qualifier: Diagnosis of  By: [name]     Past Surgical History:  Procedure Laterality Date   ABDOMINAL AORTOGRAM N/A 01/13/2018   Procedure: ABDOMINAL AORTOGRAM;  Surgeon: Troy Sine, MD;  Location: [facility];  Service: Cardiovascular;  Laterality: N/A;   AORTIC VALVE REPLACEMENT N/A 03/04/2018   Procedure: AORTIC VALVE REPLACEMENT (AVR) 31m Edwards Magna Ease Tissue Valve.;  Surgeon: HMelrose Nakayama MD;  [facility]  Service: Open Heart Surgery;  Laterality: N/A;    CARDIAC VALVE REPLACEMENT     COLONOSCOPY W/ POLYPECTOMY     COLONOSCOPY WITH PROPOFOL N/A 02/22/2020   Procedure: COLONOSCOPY WITH PROPOFOL;  Surgeon: DDoran Stabler MD;  [facility];  Service: Gastroenterology;  Laterality: N/A;   CORONARY ARTERY BYPASS GRAFT N/A 03/04/2018   Procedure: CORONARY ARTERY BYPASS GRAFTING (CABG) x1:  LIMA to LAD.;  Surgeon: HMelrose Nakayama MD;  Location: [facility]  Service: Open Heart Surgery;  Laterality: N/A;   HEMOSTASIS CLIP PLACEMENT  02/22/2020   Procedure: HEMOSTASIS CLIP PLACEMENT;  Surgeon: DDoran Stabler MD;  [facility];  Service: Gastroenterology;;   INTRAVASCULAR PRESSURE WIRE/FFR STUDY N/A 05/10/2020   Procedure: INTRAVASCULAR PRESSURE WIRE/FFR STUDY;  Surgeon: CSherren Mocha MD;  [facility];  Service: Cardiovascular;  Laterality: N/A;   LEFT HEART CATH AND CORS/GRAFTS ANGIOGRAPHY N/A 05/10/2020   Procedure: LEFT HEART CATH AND CORS/GRAFTS ANGIOGRAPHY;  Surgeon: Sherren Mocha, MD;  Location: [facility];  Service: Cardiovascular;  Laterality: N/A;   LIPOMA EXCISION Left 07/2008    "back of shoulder" Dr Gershon Crane   POLYPECTOMY  02/22/2020   Procedure: POLYPECTOMY;  Surgeon: Doran Stabler, MD;  [facility];  Service: Gastroenterology;;   RIGHT/LEFT HEART CATH AND CORONARY ANGIOGRAPHY N/A 01/13/2018   Procedure: RIGHT/LEFT HEART CATH AND CORONARY ANGIOGRAPHY;  Surgeon: Troy Sine, MD;  Location: [facility];  Service: Cardiovascular;  Laterality: N/A;   TEE WITHOUT CARDIOVERSION N/A 03/04/2018   Procedure: TRANSESOPHAGEAL ECHOCARDIOGRAM (TEE);  Surgeon: Melrose Nakayama, MD;  Location: [facility];  Service: Open Heart Surgery;  Laterality: N/A;   TONSILLECTOMY     WISDOM TOOTH EXTRACTION      Current Medications: Current Meds  Medication Sig   acetaminophen (TYLENOL) 500 MG tablet Take 1,000 mg by mouth every 6 (six) hours as needed for moderate pain or headache.    amoxicillin (AMOXIL) 500 MG capsule TAKE 4 CAPSULES 1 HOUR BEFORE DENTAL PROCEDURE.   aspirin EC 325 MG EC tablet Take 1 tablet (325 mg total) by mouth daily.   cetirizine (ZYRTEC) 10 MG tablet Take 10 mg by mouth daily as needed for allergies.   Cyanocobalamin (VITAMIN B-12 PO) Take 1 tablet by mouth daily.   ENTRESTO 97-103 MG TAKE 1 TABLET BY MOUTH 2 (TWO) TIMES DAILY. NEEDS FOLLOW UP APPOINTMENT FOR MORE REFILLS   glucose blood (PRODIGY NO CODING BLOOD GLUC) test strip Use as instructed to check blood sugar twice a day.  DX  E11.40   hydrALAZINE (APRESOLINE) 25 MG tablet Take 1 tablet (25 mg total) by mouth 3 (three) times daily.   insulin aspart (NOVOLOG FLEXPEN) 100 UNIT/ML FlexPen Max daily 40 units   insulin detemir (LEVEMIR FLEXPEN) 100 UNIT/ML FlexPen Inject 18 Units into the skin daily. INJECT 18 UNITS INTO THE SKIN EVERY MORNING.   Insulin Pen Needle (BD PEN NEEDLE NANO 2ND GEN) 32G X 4 MM MISC Inject 1 Device into the skin in the morning, at noon, in the evening, and at bedtime.   isosorbide mononitrate (IMDUR) 30 MG 24 hr tablet Take 15 mg by mouth daily.   metFORMIN (GLUCOPHAGE) 1000 MG tablet Take 1 tablet (1,000 mg total) by mouth 2 (two) times daily with a meal.   metoprolol succinate (TOPROL-XL) 50 MG 24 hr tablet TAKE 1 TABLET BY MOUTH DAILY. TAKE WITH OR IMMEDIATELY FOLLOWING A MEAL.   rosuvastatin (CRESTOR) 20 MG tablet TAKE 1 TABLET BY MOUTH EVERY DAY   spironolactone (ALDACTONE) 25 MG tablet TAKE 1/2 TABLET BY MOUTH EVERY DAY     Allergies:   Lisinopril, Hydrochlorothiazide, Amlodipine besylate, and Farxiga [dapagliflozin]   Social History   Socioeconomic History   Marital status: Married    Spouse name: Not on file   Number of children: Not on file   Years of education: Not on file   Highest education level: Not on file  Occupational History   Not on file  Tobacco Use   Smoking status: Former    Packs/day: 1.50    Years: 41.00    Total pack years: 61.50     Types: Cigarettes    Quit date: 08/26/2010    Years since quitting: 11.7   Smokeless tobacco: Never   Tobacco comments:    Quit 2012  Vaping Use   Vaping Use: Never used  Substance and Sexual Activity   Alcohol use: Yes    Comment:  occasionally   Drug use: Not Currently   Sexual activity: Not Currently  Other Topics Concern   Not on file  Social History Narrative   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   Married    Former Smoker -  33 pack year history   Alcohol use-no     Drug use-no             Social Determinants of Radio broadcast assistant Strain: Low Risk  (03/30/2021)   Overall Financial Resource Strain (CARDIA)    Difficulty of Paying Living Expenses: Not hard at all  Food Insecurity: No Food Insecurity (03/30/2021)   Hunger Vital Sign    Worried About Running Out of Food in the Last Year: Never true    [place] in the Last Year: Never true  Transportation Needs: No Transportation Needs (03/30/2021)   PRAPARE - Hydrologist (Medical): No    Lack of Transportation (Non-Medical): No  Physical Activity: Insufficiently Active (03/30/2021)   Exercise Vital Sign    Days of Exercise per Week: 2 days    Minutes of Exercise per Session: 60 min  Stress: No Stress Concern Present (03/30/2021)   [place]    Feeling of Stress : Not at all  Social Connections: Moderately Integrated (03/30/2021)   Social Connection and Isolation Panel [NHANES]    Frequency of Communication with Friends and Family: More than three times a week    Frequency of Social Gatherings with Friends and Family: More than three times a week    Attends Religious Services: More than 4 times per year    Active Member of [employer] or Organizations: No    Attends Music therapist: Never    Marital Status: Married     Family History: The patient's family history includes Alzheimer's disease in his father;  Diabetes in his father; Hypertension in his mother; Melanoma (age of onset: 73) in his father. There is no history of Colon cancer, Esophageal cancer, Colon polyps, or Stomach cancer.  ROS:   Review of Systems  Constitution: Negative for decreased appetite, fever and weight gain.  HENT: Negative for congestion, ear discharge, hoarse voice and sore throat.   Eyes: Negative for discharge, redness, vision loss in right eye and visual halos.  Cardiovascular: Negative for chest pain, dyspnea on exertion, leg swelling, orthopnea and palpitations.  Respiratory: Negative for cough, hemoptysis, shortness of breath and snoring.   Endocrine: Negative for heat intolerance and polyphagia.  Hematologic/Lymphatic: Negative for bleeding problem. Does not bruise/bleed easily.  Skin: Negative for flushing, nail changes, rash and suspicious lesions.  Musculoskeletal: Negative for arthritis, joint pain, muscle cramps, myalgias, neck pain and stiffness.  Gastrointestinal: Negative for abdominal pain, bowel incontinence, diarrhea and excessive appetite.  Genitourinary: Negative for decreased libido, genital sores and incomplete emptying.  Neurological: Negative for brief paralysis, focal weakness, headaches and loss of balance.  Psychiatric/Behavioral: Negative for altered mental status, depression and suicidal ideas.  Allergic/Immunologic: Negative for HIV exposure and persistent infections.    EKGs/Labs/Other Studies Reviewed:    The following studies were reviewed today:   EKG:  The ekg ordered today demonstrates sinus rhythm, heart rate 82 bpm.  Compared to prior EKG no significant change.  Echo 11/2020 IMPRESSIONS   1. Left ventricular ejection fraction, by estimation, is 40 to 45%. The left ventricle has mildly decreased function. The left ventricle has no regional wall motion abnormalities. Left ventricular  diastolic parameters are consistent with Grade I diastolic dysfunction (impaired relaxation).    2. Right ventricular systolic function is mildly reduced. The right ventricular size is normal.   3. The mitral valve is normal in structure. Trivial mitral valve regurgitation. No evidence of mitral stenosis.   4. The aortic valve is grossly normal. Aortic valve regurgitation is not visualized. There is a 23 mm Edwards Magna Valve valve present in the aortic position. Procedure Date: 03/13/2018. Valve appears to be functioning normally. Mean gradient across the valve 32m HG.   5. The inferior vena cava is normal in size with greater than 50%  respiratory variability, suggesting right atrial pressure of 3 mmHg.   FINDINGS   Left Ventricle: Left ventricular ejection fraction, by estimation, is 40 to 45%. The left ventricle has mildly decreased function. The left ventricle has no regional wall motion abnormalities. The left ventricular internal cavity size was normal in size. There is no left ventricular hypertrophy. Abnormal (paradoxical) septal motion consistent with post-operative status. Left ventricular diastolic parameters are consistent with Grade I diastolic dysfunction (impaired relaxation).  Right Ventricle: The right ventricular size is normal. No increase in right ventricular wall thickness. Right ventricular systolic function is mildly reduced.   Left Atrium: Left atrial size was normal in size.   Right Atrium: Right atrial size was normal in size.   Pericardium: There is no evidence of pericardial effusion.    Mitral Valve: The mitral valve is normal in structure. There is mild  thickening of the mitral valve leaflet(s). Trivial mitral valve  regurgitation. No evidence of mitral valve stenosis.   Tricuspid Valve: The tricuspid valve is normal in structure. Tricuspid  valve regurgitation is trivial. No evidence of tricuspid stenosis.   Aortic Valve: The aortic valve is grossly normal. Aortic valve  regurgitation is not visualized. No aortic stenosis is present. Aortic  valve mean  gradient measures 15.0 mmHg. Aortic valve peak gradient  measures 27.0 mmHg. Aortic valve area, by VTI  measures 1.46 cm. There is a 23 mm Edwards Magna Valve valve present in  the aortic position. Procedure Date: 03/13/2018.   Pulmonic Valve: The pulmonic valve was normal in structure. Pulmonic valve  regurgitation is not visualized. No evidence of pulmonic stenosis.   Aorta: The aortic root is normal in size and structure.   Venous: The inferior vena cava is normal in size with greater than 50%  respiratory variability, suggesting right atrial pressure of 3 mmHg.   IAS/Shunts: No atrial level shunt detected by color flow Doppler.    Recent Labs: 11/12/2021: Magnesium 1.9 03/29/2022: ALT 7; BUN 13; Creatinine 0.94; Hemoglobin 12.8; Platelet Count 388; Potassium 4.4; Sodium 138  Recent Lipid Panel    Component Value Date/Time   CHOL 125 06/04/2021 1013   TRIG 128.0 06/04/2021 1013   HDL 44.40 06/04/2021 1013   CHOLHDL 3 06/04/2021 1013   VLDL 25.6 06/04/2021 1013   LDLCALC 55 06/04/2021 1013   LDLDIRECT 177.6 10/07/2007 1001    Physical Exam:    VS:  BP 132/70   Pulse 72   Ht '6\' 4"'$  (1.93 m)   Wt 174 lb 9.6 oz (79.2 kg)   SpO2 93%   BMI 21.25 kg/m     Wt Readings from Last 3 Encounters:  05/15/22 174 lb 9.6 oz (79.2 kg)  05/13/22 174 lb (78.9 kg)  03/29/22 172 lb (78 kg)     GEN: Well nourished, well developed in no acute distress HEENT: Normal NECK: No JVD;  No carotid bruits LYMPHATICS: No lymphadenopathy CARDIAC: S1S2 noted,RRR, no murmurs, rubs, gallops RESPIRATORY:  Clear to auscultation without rales, wheezing or rhonchi  ABDOMEN: Soft, non-tender, non-distended, +bowel sounds, no guarding. EXTREMITIES: No edema, No cyanosis, no clubbing MUSCULOSKELETAL:  No deformity  SKIN: Warm and dry NEUROLOGIC:  Alert and oriented x 3, non-focal PSYCHIATRIC:  Normal affect, good insight  ASSESSMENT:    1. Depressed left ventricular ejection fraction   2. Primary  hypertension   3. Type 2 diabetes mellitus with hyperglycemia, with long-term current use of insulin ([place])   4. Dyslipidemia   5. H/O aortic valve replacement   6. Status post coronary artery bypass graft   7. Ischemic cardiomyopathy     PLAN: Exam   Depressed ejection fraction, will continue on the current medication regimen.  Will need a repeat echocardiogram to reassess his LV function.  Blood pressure is within target.  Manually taken by me 130/72 mmHg.  Coronary artery disease having no angina symptoms.  We will continue his current medication.     The patient is in agreement with the above plan. The patient left the office in stable condition.  The patient will follow up in 6 months or sooner if needed.   Medication Adjustments/Labs and Tests Ordered: Current medicines are reviewed at length with the patient today.  Concerns regarding medicines are outline she d above.  Orders Placed This Encounter  Procedures   ECHOCARDIOGRAM COMPLETE   No orders of the defined types were placed in this encounter.   Patient Instructions  Medication Instructions:  Your physician recommends that you continue on your current medications as directed. Please refer to the Current Medication list given to you today.  *If you need a refill on your cardiac medications before your next appointment, please call your pharmacy*   Lab Work: None   Testing/Procedures: Your physician has requested that you have an echocardiogram. Echocardiography is a painless test that uses sound waves to create images of your heart. It provides your doctor with information about the size and shape of your heart and how well your heart's chambers and valves are working. This procedure takes approximately one hour. There are no restrictions for this procedure.    Follow-Up: At [facility], you and your health needs are our priority.  As part of our continuing mission to provide you with exceptional heart  care, we have created designated Provider Care Teams.  These Care Teams include your primary Cardiologist (physician) and Advanced Practice Providers (APPs -  Physician Assistants and Nurse Practitioners) who all work together to provide you with the care you need, when you need it.  We recommend signing up for the patient portal called "MyChart".  Sign up information is provided on this After Visit Summary.  MyChart is used to connect with patients for Virtual Visits (Telemedicine).  Patients are able to view lab/test results, encounter notes, upcoming appointments, etc.  Non-urgent messages can be sent to your provider as well.   To learn more about what you can do with MyChart, go to [email or web address].    Your next appointment:   6 month(s)  The format for your next appointment:   In Person  Provider:   Berniece Salines, DO     Other Instructions   Important Information About Sugar         Adopting a Healthy Lifestyle.  Know what a healthy weight is for you (roughly BMI <25) and aim to maintain this   Aim for  7+ servings of fruits and vegetables daily   65-80+ fluid ounces of water or unsweet tea for healthy kidneys   Limit to max 1 drink of alcohol per day; avoid smoking/tobacco   Limit animal fats in diet for cholesterol and heart health - choose grass fed whenever available   Avoid highly processed foods, and foods high in saturated/trans fats   Aim for low stress - take time to unwind and care for your mental health   Aim for 150 min of moderate intensity exercise weekly for heart health, and weights twice weekly for bone health   Aim for 7-9 hours of sleep daily   When it comes to diets, agreement about the perfect plan isnt easy to find, even among the experts. Experts at the [place] developed an idea known as the Healthy Eating Plate. Just imagine a plate divided into logical, healthy portions.   The emphasis is on diet quality:    Load up on vegetables and fruits - one-half of your plate: Aim for color and variety, and remember that potatoes dont count.   Go for whole grains - one-quarter of your plate: Whole wheat, barley, wheat berries, quinoa, oats, brown rice, and foods made with them. If you want pasta, go with whole wheat pasta.   Protein power - one-quarter of your plate: Fish, chicken, beans, and nuts are all healthy, versatile protein sources. Limit red meat.   The diet, however, does go beyond the plate, offering a few other suggestions.   Use healthy plant oils, such as olive, canola, soy, corn, sunflower and peanut. Check the labels, and avoid partially hydrogenated oil, which have unhealthy trans fats.   If youre thirsty, drink water. Coffee and tea are good in moderation, but skip sugary drinks and limit milk and dairy products to one or two daily servings.   The type of carbohydrate in the diet is more important than the amount. Some sources of carbohydrates, such as vegetables, fruits, whole grains, and beans-are healthier than others.   Finally, stay active  Signed, Berniece Salines, DO  05/15/2022 10:20 AM    [place]

## 2022-05-31 ENCOUNTER — Ambulatory Visit (HOSPITAL_COMMUNITY): Payer: Medicare PPO | Attending: Cardiology

## 2022-05-31 DIAGNOSIS — Z87891 Personal history of nicotine dependence: Secondary | ICD-10-CM | POA: Diagnosis not present

## 2022-05-31 DIAGNOSIS — R011 Cardiac murmur, unspecified: Secondary | ICD-10-CM | POA: Insufficient documentation

## 2022-05-31 DIAGNOSIS — I351 Nonrheumatic aortic (valve) insufficiency: Secondary | ICD-10-CM

## 2022-05-31 DIAGNOSIS — I340 Nonrheumatic mitral (valve) insufficiency: Secondary | ICD-10-CM

## 2022-05-31 DIAGNOSIS — I10 Essential (primary) hypertension: Secondary | ICD-10-CM | POA: Diagnosis not present

## 2022-05-31 DIAGNOSIS — J449 Chronic obstructive pulmonary disease, unspecified: Secondary | ICD-10-CM | POA: Diagnosis not present

## 2022-05-31 DIAGNOSIS — Z953 Presence of xenogenic heart valve: Secondary | ICD-10-CM | POA: Insufficient documentation

## 2022-05-31 DIAGNOSIS — E119 Type 2 diabetes mellitus without complications: Secondary | ICD-10-CM | POA: Insufficient documentation

## 2022-05-31 DIAGNOSIS — R0989 Other specified symptoms and signs involving the circulatory and respiratory systems: Secondary | ICD-10-CM | POA: Diagnosis not present

## 2022-05-31 DIAGNOSIS — I4891 Unspecified atrial fibrillation: Secondary | ICD-10-CM | POA: Insufficient documentation

## 2022-05-31 DIAGNOSIS — E785 Hyperlipidemia, unspecified: Secondary | ICD-10-CM | POA: Insufficient documentation

## 2022-05-31 DIAGNOSIS — Z951 Presence of aortocoronary bypass graft: Secondary | ICD-10-CM | POA: Insufficient documentation

## 2022-06-04 LAB — ECHOCARDIOGRAM COMPLETE
AR max vel: 1.52 cm2
AV Area VTI: 1.48 cm2
AV Area mean vel: 1.45 cm2
AV Mean grad: 15.2 mmHg
AV Peak grad: 27.3 mmHg
Ao pk vel: 2.61 m/s
Area-P 1/2: 3.43 cm2
S' Lateral: 3.2 cm

## 2022-06-05 ENCOUNTER — Ambulatory Visit (INDEPENDENT_AMBULATORY_CARE_PROVIDER_SITE_OTHER): Payer: Medicare PPO | Admitting: Internal Medicine

## 2022-06-05 ENCOUNTER — Encounter: Payer: Self-pay | Admitting: Internal Medicine

## 2022-06-05 VITALS — BP 134/76 | HR 78 | Temp 98.0°F | Resp 18 | Ht 76.0 in | Wt 172.5 lb

## 2022-06-05 DIAGNOSIS — Z87891 Personal history of nicotine dependence: Secondary | ICD-10-CM

## 2022-06-05 DIAGNOSIS — Z0000 Encounter for general adult medical examination without abnormal findings: Secondary | ICD-10-CM

## 2022-06-05 DIAGNOSIS — Z125 Encounter for screening for malignant neoplasm of prostate: Secondary | ICD-10-CM | POA: Diagnosis not present

## 2022-06-05 DIAGNOSIS — Z0001 Encounter for general adult medical examination with abnormal findings: Secondary | ICD-10-CM | POA: Diagnosis not present

## 2022-06-05 DIAGNOSIS — Z23 Encounter for immunization: Secondary | ICD-10-CM | POA: Diagnosis not present

## 2022-06-05 DIAGNOSIS — R059 Cough, unspecified: Secondary | ICD-10-CM | POA: Diagnosis not present

## 2022-06-05 DIAGNOSIS — J432 Centrilobular emphysema: Secondary | ICD-10-CM

## 2022-06-05 DIAGNOSIS — E785 Hyperlipidemia, unspecified: Secondary | ICD-10-CM | POA: Diagnosis not present

## 2022-06-05 LAB — LIPID PANEL
Cholesterol: 121 mg/dL (ref 0–200)
HDL: 46.5 mg/dL (ref >39.00)
LDL Cholesterol: 54 mg/dL (ref 0–99)
NonHDL: 74.28
Total CHOL/HDL Ratio: 3
Triglycerides: 101 mg/dL (ref 0.0–149.0)
VLDL: 20.2 mg/dL (ref 0.0–40.0)

## 2022-06-05 LAB — PSA: PSA: 0.31 ng/mL (ref 0.10–4.00)

## 2022-06-05 NOTE — Patient Instructions (Addendum)
Vaccines I recommend:  Covid booster Shingrix (shingles)  Tdap (tetanus) RSV vaccine      GO TO THE LAB : Get the blood work     [place], [place] back for a checkup in 6 months    Advanced care planning:  Do you have a "Living will", "[place] of attorney" ?   If you already have a living will or healthcare power of attorney, is recommended you bring the copy to be scanned in your chart. The document will be available to all the doctors you see in the system.  If you don't have one, please consider create one.  Advance care planning is a process that supports adults in  understanding and sharing their preferences regarding future medical care.   The patient's preferences are recorded in documents called Advance Directives.    Advanced directives are completed (and can be modified at any time) while the patient is in full mental capacity.   The documentation should be available at all times to the patient, the family and the healthcare providers.   This legal documents direct treatment decision making and/or appoint a surrogate to make the decision if the patient is not capable to do so.    Advance directives can be documented in many types of formats,  documents have names such as:  Lliving will  Durable power of attorney for healthcare (healthcare proxy or healthcare power of attorney)  Combined directives  Physician orders for life-sustaining treatment    More information at:  [email or web address]

## 2022-06-05 NOTE — Progress Notes (Signed)
Subjective:    Patient ID: [name], male    DOB: 08/17/1951, 71 y.o.   MRN: [id number]  DOS:  06/05/2022 Type of visit - description: cpx  Since the last office visit is a stable. Saw multiple doctors.  Notes reviewed.  He is a former heavy smoker, reports sporadic cough and occasional sputum production.  Wheezing sometimes?. He is able to do all his ADLs without shortness of breath.  Review of Systems  Other than above, a 14 point review of systems is negative     Past Medical History:  Diagnosis Date   Abnormal myocardial perfusion study 2006   EF 44% ? inferoseptal ischemia. no cath done   Benign neoplasm of cecum    Breast mass, right 03/2008   Cardiomyopathy ([place]) 07/20/2018   Ejection fraction 4045% in September 2019   Centrilobular emphysema ([place]) 08/25/2018   02/04/2018-high-res CT- no evidence of interstitial lung disease, severe centrilobular emphysema and mild diffuse bronchial wall thickening suggesting COPD, no calcified pleural plaques no pleural effusion, mild smooth bilateral pleural thickening which is nonspecific  02/04/2018-pulmonary function test- FVC 3.37 (71% predicted), postbronchodilator ratio 50, postbronchodilator FEV1 54, mid flow reve   CHF (congestive heart failure) ([place])    COLONIC POLYPS, HX OF 03/18/2007   Qualifier: Diagnosis of  By: [name]    Contact dermatitis 01/31/2013   COPD (chronic obstructive pulmonary disease) ([place])    "dx'd 12/2017"   COPD GOLD II A  08/25/2018   02/04/2018-high-res CT- no evidence of interstitial lung disease, severe centrilobular emphysema and mild diffuse bronchial wall thickening suggesting COPD, no calcified pleural plaques no pleural effusion, mild smooth bilateral pleural thickening which is nonspecific  02/04/2018-pulmonary function test- FVC 3.37 (71% predicted), postbronchodilator ratio 50, postbronchodilator FEV1 54, mid flow reve   Coronary artery disease    Critical aortic valve stenosis  01/12/2018   Calculated valve area less than 0.5 cm mean gradient more than 60 mm of mercury   DEGENERATIVE JOINT DISEASE, BACK 03/18/2007   Qualifier: Diagnosis of  By: [name]    Depressed left ventricular ejection fraction 03/28/2020   Diabetes mellitus ([place]) 07/25/2020   Dyslipidemia 04/20/2020   Dyspnea on exertion 01/12/2018   ERECTILE DYSFUNCTION, ORGANIC 02/10/2009   Qualifier: Diagnosis of  By: [name]    Former cigarette smoker 10/12/2018   Quit in 2012 41-pack-year smoking history   Goals of care, counseling/discussion 08/24/2020   H/O aortic valve replacement 05/05/2020   Heart murmur    HEMORRHOIDS, INTERNAL 03/18/2007   Qualifier: Diagnosis of  By: [name]    HTN (hypertension) 10/21/2007        Hyperlipemia 10/21/2007   Qualifier: Diagnosis of  By: [name]    Hypertension    Hypoglycemia due to insulin 03/26/2020   Low back pain    MYOCARDIAL PERFUSION SCAN, WITH STRESS TEST, ABNORMAL 08/11/2008   Qualifier: Diagnosis of  By: Johnsie Cancel, MD, Rona Ravens    Nocturnal hypoxemia 08/25/2018   04/09/18 >>> ONO - telephone note>>> Patient's ONO on room air showed that he was less than 88% for more than an hour. Patient needs to be on 2 L at HS.  10/06/2018-overnight oximetry-entire duration was 4 hours and 23 minutes, SPO2 less than 88% for 25 minutes and 32 seconds     Oxygen deficiency    uses 2L at night   Paroxysmal atrial fibrillation ([place]) 10/12/2018   PCP  NOTES >>>>>>>>>>>>>>>>> 05/14/2020   Polycythemia vera ([place]) 08/04/2020   S/P AVR 03/04/2018   AVR 23 mm Edwards Magna Valve - bioprostetic 03/04/2018 LIMA - LAD   Status post coronary artery bypass graft 03/20/2018   LIMA to LAD during aortic valve replacement surgery 03/04/2018   Type 2 diabetes mellitus with hyperglycemia, with long-term current use of insulin ([place]) 04/20/2020   Type 2 diabetes mellitus, uncontrolled, with neuropathy 05/20/2008   Qualifier: Diagnosis of  By: [name]     Past Surgical History:  Procedure Laterality Date   ABDOMINAL AORTOGRAM N/A 01/13/2018   Procedure: ABDOMINAL AORTOGRAM;  Surgeon: Troy Sine, MD;  Location: [facility];  Service: Cardiovascular;  Laterality: N/A;   AORTIC VALVE REPLACEMENT N/A 03/04/2018   Procedure: AORTIC VALVE REPLACEMENT (AVR) 42m Edwards Magna Ease Tissue Valve.;  Surgeon: HMelrose Nakayama MD;  [facility]  Service: Open Heart Surgery;  Laterality: N/A;   CARDIAC VALVE REPLACEMENT     COLONOSCOPY W/ POLYPECTOMY     COLONOSCOPY WITH PROPOFOL N/A 02/22/2020   Procedure: COLONOSCOPY WITH PROPOFOL;  Surgeon: DDoran Stabler MD;  [facility];  Service: Gastroenterology;  Laterality: N/A;   CORONARY ARTERY BYPASS GRAFT N/A 03/04/2018   Procedure: CORONARY ARTERY BYPASS GRAFTING (CABG) x1:  LIMA to LAD.;  Surgeon: HMelrose Nakayama MD;  [facility]  Service: Open Heart Surgery;  Laterality: N/A;   HEMOSTASIS CLIP PLACEMENT  02/22/2020   Procedure: HEMOSTASIS CLIP PLACEMENT;  Surgeon: DDoran Stabler MD;  [facility];  Service: Gastroenterology;;   INTRAVASCULAR PRESSURE WIRE/FFR STUDY N/A 05/10/2020   Procedure: INTRAVASCULAR PRESSURE WIRE/FFR STUDY;  Surgeon: CSherren Mocha MD;  [facility];  Service: Cardiovascular;  Laterality: N/A;   LEFT HEART CATH AND CORS/GRAFTS ANGIOGRAPHY N/A 05/10/2020   Procedure: LEFT HEART CATH AND CORS/GRAFTS ANGIOGRAPHY;  Surgeon: CSherren Mocha MD;  [facility];  Service: Cardiovascular;  Laterality: N/A;   LIPOMA EXCISION Left 07/2008    "back of shoulder" Dr TGershon Crane  POLYPECTOMY  02/22/2020   Procedure: POLYPECTOMY;  Surgeon: DDoran Stabler MD;  [facility];  Service: Gastroenterology;;   RIGHT/LEFT HEART CATH AND CORONARY ANGIOGRAPHY N/A 01/13/2018   Procedure: RIGHT/LEFT HEART CATH AND CORONARY ANGIOGRAPHY;  Surgeon: KTroy Sine MD;  [facility];   Service: Cardiovascular;  Laterality: N/A;   TEE WITHOUT CARDIOVERSION N/A 03/04/2018   Procedure: TRANSESOPHAGEAL ECHOCARDIOGRAM (TEE);  Surgeon: HMelrose Nakayama MD;  [facility]  Service: Open Heart Surgery;  Laterality: N/A;   TONSILLECTOMY     WISDOM TOOTH EXTRACTION      Social History   Socioeconomic History   Marital status: Married    Spouse name: Not on file   Number of children: 3   Years of education: Not on file   Highest education level: Not on file  Occupational History   Not on file  Tobacco Use   Smoking status: Former    Packs/day: 1.50    Types: Cigarettes    Start date: 148   Quit date: 2000    Years since quitting: 23.7   Smokeless tobacco: Never   Tobacco comments:    Smoked heavy from 89until 2000, does not qualify for screening  Vaping Use   Vaping Use: Never used  Substance and Sexual Activity   Alcohol use: Yes    Comment: occasionally   Drug use:  Not Currently   Sexual activity: Not Currently  Other Topics Concern   Not on file  Social History Narrative   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   Married    Former Smoker -  33 pack year history   Alcohol use-no     Drug use-no             Social Determinants of Radio broadcast assistant Strain: Low Risk  (03/30/2021)   Overall Financial Resource Strain (CARDIA)    Difficulty of Paying Living Expenses: Not hard at all  Food Insecurity: No Food Insecurity (03/30/2021)   Hunger Vital Sign    Worried About Running Out of Food in the Last Year: Never true    [place] in the Last Year: Never true  Transportation Needs: No Transportation Needs (03/30/2021)   PRAPARE - Hydrologist (Medical): No    Lack of Transportation (Non-Medical): No  Physical Activity: Insufficiently Active (03/30/2021)   Exercise Vital Sign    Days of Exercise per Week: 2 days    Minutes of Exercise per Session: 60 min  Stress: No Stress Concern Present (03/30/2021)   [place]    Feeling of Stress : Not at all  Social Connections: Moderately Integrated (03/30/2021)   Social Connection and Isolation Panel [NHANES]    Frequency of Communication with Friends and Family: More than three times a week    Frequency of Social Gatherings with Friends and Family: More than three times a week    Attends Religious Services: More than 4 times per year    Active Member of [employer] or Organizations: No    Attends Archivist Meetings: Never    Marital Status: Married  Human resources officer Violence: Not At Risk (03/30/2021)   Humiliation, Afraid, Rape, and Kick questionnaire    Fear of Current or Ex-Partner: No    Emotionally Abused: No    Physically Abused: No    Sexually Abused: No     Current Outpatient Medications  Medication Instructions   acetaminophen (TYLENOL) 1,000 mg, Oral, Every 6 hours PRN   amoxicillin (AMOXIL) 500 MG capsule TAKE 4 CAPSULES 1 HOUR BEFORE DENTAL PROCEDURE.   aspirin EC 325 mg, Oral, Daily   cetirizine (ZYRTEC) 10 mg, Oral, Daily PRN   Cyanocobalamin (VITAMIN B-12 PO) 1 tablet, Oral, Daily   ENTRESTO 97-103 MG 1 tablet, Oral, 2 times daily, NEEDS FOLLOW UP APPOINTMENT FOR MORE REFILLS   glucose blood (PRODIGY NO CODING BLOOD GLUC) test strip Use as instructed to check blood sugar twice a day.  DX  E11.40    hydrALAZINE (APRESOLINE) 25 mg, Oral, 3 times daily   insulin aspart (NOVOLOG FLEXPEN) 100 UNIT/ML FlexPen Max daily 40 units   Insulin Pen Needle (BD PEN NEEDLE NANO 2ND GEN) 32G X 4 MM MISC 1 Device, Subcutaneous, 4 times daily   isosorbide mononitrate (IMDUR) 15 mg, Oral, Daily   Levemir FlexPen 18 Units, Subcutaneous, Daily, INJECT 18 UNITS INTO THE SKIN EVERY MORNING.   metFORMIN (GLUCOPHAGE) 1,000 mg, Oral, 2 times daily with meals   metoprolol succinate (TOPROL-XL) 50 MG 24 hr tablet TAKE 1 TABLET BY MOUTH DAILY. TAKE WITH OR IMMEDIATELY FOLLOWING A MEAL.    rosuvastatin (CRESTOR) 20 MG tablet TAKE 1 TABLET BY MOUTH EVERY DAY   spironolactone (ALDACTONE) 25 MG tablet TAKE 1/2 TABLET BY MOUTH EVERY DAY       Objective:  Physical Exam BP 134/76   Pulse 78   Temp 98 F (36.7 C) (Oral)   Resp 18   Ht '6\' 4"'$  (1.93 m)   Wt 172 lb 8 oz (78.2 kg)   SpO2 97%   BMI 21.00 kg/m  General: Well developed, NAD, BMI noted Neck: No  thyromegaly  HEENT:  Normocephalic . Face symmetric, atraumatic Lungs:  CTA B Normal respiratory effort, no intercostal retractions, no accessory muscle use. Heart: RRR,  no murmur.  Abdomen:  Not distended, soft, non-tender. No rebound or rigidity.   Lower extremities: no pretibial edema bilaterally  Skin: Exposed areas without rash. Not pale. Not jaundice Neurologic:  alert & oriented X3.  Speech normal, gait appropriate for age and unassisted Strength symmetric and appropriate for age.  Psych: Cognition and judgment appear intact.  Cooperative with normal attention span and concentration.  Behavior appropriate. No anxious or depressed appearing.     Assessment    Assessment (new patient 03/2020) DM HTN CAD, CABG and aortic valve replacement due to stenosis on 02/2018 Cardiomyopathy, cath 05/10/2020, Rx medical treatment COPD 02/04/2018-high-res CT- no evidence of interstitial lung disease, severe centrilobular emphysema  02/04/2018-pulmonary function test- FVC 3.37 (71% predicted),  mid flow reversibility after bronchodilator  Polycythemia vera, + JAK2 Dx [id number].  PLAN:  Here for CPX DM Saw Endo last month, A1c was 7.5.  They note that he stopped [place] due to genital irritation, declining CGM technology ,declines  GLP-1's, comfortable using insulin. CAD, aortic valve replacement, cardiomyopathy Saw cardiology 05/15/2022, echo done.  EF 50 to 55%. Polycythemia vera, saw hematology 2023 Former smoker:  quit in 2000, does not qualify for lung cancer screening.   COPD: last seen by pulm 2020, was rx O2  qhs, has occasional cough/wheezing. Minimal sxs , no need to repeat PFTs  RTC 6 months

## 2022-06-05 NOTE — Assessment & Plan Note (Signed)
Here for CPX DM Saw Endo last month, A1c was 7.5.  They note that he stopped [place] due to genital irritation, declining CGM technology ,declines  GLP-1's, comfortable using insulin. CAD, aortic valve replacement, cardiomyopathy Saw cardiology 05/15/2022, echo done.  EF 50 to 55%. Polycythemia vera, saw hematology 2023 Former smoker:  quit in 2000, does not qualify for lung cancer screening.   COPD: last seen by pulm 2020, was rx O2 qhs, has occasional cough/wheezing. Minimal sxs  RTC 6 months

## 2022-06-05 NOTE — Assessment & Plan Note (Signed)
Td : rec again this year  PNM 13: 2018.  PNM 23 2009, 2019 Shingrix: Rec again this year RSV discussed COVID-19: plans to proceed at some point  Flu shot today CCS: Had a colonoscopy  01/2020, next per GI Prostate cancer screening: Check PSA, DRE 2022 wnl.  No symptoms. Labs: Reviewed, will get a FLP and PSA. Healthcare power of attorney discussed.  Smoked heavy from 1973 until 2000, does not qualify for screening Lifestyle: Not very active, encouraged to start taking a walk daily, start a slow, 15 or 20 minutes and gradually increase.

## 2022-06-09 ENCOUNTER — Other Ambulatory Visit (HOSPITAL_COMMUNITY): Payer: Self-pay | Admitting: Internal Medicine

## 2022-06-10 ENCOUNTER — Telehealth: Payer: Self-pay | Admitting: Cardiology

## 2022-06-10 MED ORDER — ENTRESTO 97-103 MG PO TABS: 1.0000 | ORAL_TABLET | Freq: Two times a day (BID) | ORAL | 6 refills | Status: DC

## 2022-06-10 NOTE — Telephone Encounter (Signed)
*  STAT* If patient is at the pharmacy, call can be transferred to refill team.   1. Which medications need to be refilled? (please list name of each medication and dose if known)  ENTRESTO 97-103 MG  2. Which pharmacy/location (including street and city if local pharmacy) is medication to be sent to? [id number] [place] [place] - [address]  3. Do they need a 30 day or 90 day supply? 90   Patient took his last pill today

## 2022-07-08 ENCOUNTER — Other Ambulatory Visit: Payer: Self-pay | Admitting: Cardiology

## 2022-07-11 ENCOUNTER — Other Ambulatory Visit (HOSPITAL_COMMUNITY): Payer: Self-pay | Admitting: Internal Medicine

## 2022-07-11 ENCOUNTER — Other Ambulatory Visit: Payer: Self-pay | Admitting: Cardiology

## 2022-08-01 ENCOUNTER — Telehealth: Payer: Self-pay

## 2022-08-01 NOTE — Telephone Encounter (Signed)
Vm left for patient spouse to callback with [facility] for Levemir.

## 2022-08-13 ENCOUNTER — Encounter: Payer: Self-pay | Admitting: Internal Medicine

## 2022-08-14 ENCOUNTER — Telehealth: Payer: Self-pay

## 2022-08-14 ENCOUNTER — Other Ambulatory Visit (HOSPITAL_COMMUNITY): Payer: Self-pay

## 2022-08-14 ENCOUNTER — Encounter: Payer: Self-pay | Admitting: Family

## 2022-08-14 NOTE — Telephone Encounter (Signed)
Pharmacy Patient Advocate Encounter   Received notification from [facility] that prior authorization for LEVEMIR FLEXPEN 100U/ML is needed.    PA submitted on 08/14/22 Key [id number] Status is pending  Karie Soda, [place] Patient Advocate Specialist Direct Number: [phone] Fax: [phone]

## 2022-08-14 NOTE — Telephone Encounter (Signed)
PA started, see chart for updates.

## 2022-08-22 ENCOUNTER — Other Ambulatory Visit (HOSPITAL_COMMUNITY): Payer: Self-pay

## 2022-08-22 ENCOUNTER — Ambulatory Visit
Admission: RE | Admit: 2022-08-22 | Discharge: 2022-08-22 | Disposition: A | Discharge status: Home / Self Care | Payer: Medicare PPO | Source: Ambulatory Visit | Attending: Acute Care | Admitting: Acute Care

## 2022-08-22 DIAGNOSIS — Z87891 Personal history of nicotine dependence: Secondary | ICD-10-CM | POA: Diagnosis not present

## 2022-08-22 NOTE — Telephone Encounter (Signed)
Pharmacy Patient Advocate Encounter  Received notification from [facility] that the request for prior authorization for LEVEMIR FLEXPEN 100U/ML has been denied due to PLAN HAS TO SEE PATIENT HAS TRIED AND FAILED [place].    PLAN PREFERS TRESIBA, LANTUS, [place], [place] Patient Advocate Specialist Direct Number: [phone] Fax: [phone]

## 2022-08-23 MED ORDER — LANTUS SOLOSTAR 100 UNIT/ML ~~LOC~~ SOPN: 18.0000 [IU] | PEN_INJECTOR | Freq: Every day | SUBCUTANEOUS | 11 refills | Status: DC

## 2022-08-23 NOTE — Telephone Encounter (Signed)
Lantus sent to replace Levemir

## 2022-08-27 ENCOUNTER — Other Ambulatory Visit: Payer: Self-pay | Admitting: Acute Care

## 2022-08-27 DIAGNOSIS — Z87891 Personal history of nicotine dependence: Secondary | ICD-10-CM

## 2022-08-27 DIAGNOSIS — Z122 Encounter for screening for malignant neoplasm of respiratory organs: Secondary | ICD-10-CM

## 2022-09-12 DIAGNOSIS — J9610 Chronic respiratory failure, unspecified whether with hypoxia or hypercapnia: Secondary | ICD-10-CM | POA: Diagnosis not present

## 2022-09-12 DIAGNOSIS — J449 Chronic obstructive pulmonary disease, unspecified: Secondary | ICD-10-CM | POA: Diagnosis not present

## 2022-09-12 DIAGNOSIS — J438 Other emphysema: Secondary | ICD-10-CM | POA: Diagnosis not present

## 2022-09-18 DIAGNOSIS — H5203 Hypermetropia, bilateral: Secondary | ICD-10-CM | POA: Diagnosis not present

## 2022-09-18 DIAGNOSIS — H25813 Combined forms of age-related cataract, bilateral: Secondary | ICD-10-CM | POA: Diagnosis not present

## 2022-09-18 DIAGNOSIS — H524 Presbyopia: Secondary | ICD-10-CM | POA: Diagnosis not present

## 2022-09-18 DIAGNOSIS — E119 Type 2 diabetes mellitus without complications: Secondary | ICD-10-CM | POA: Diagnosis not present

## 2022-09-18 LAB — HM DIABETES EYE EXAM

## 2022-09-30 ENCOUNTER — Inpatient Hospital Stay: Payer: Medicare PPO

## 2022-09-30 ENCOUNTER — Inpatient Hospital Stay (HOSPITAL_BASED_OUTPATIENT_CLINIC_OR_DEPARTMENT_OTHER): Payer: Medicare PPO | Admitting: Family

## 2022-09-30 ENCOUNTER — Other Ambulatory Visit: Payer: Self-pay

## 2022-09-30 ENCOUNTER — Inpatient Hospital Stay: Payer: Medicare PPO | Attending: Hematology & Oncology

## 2022-09-30 ENCOUNTER — Encounter: Payer: Self-pay | Admitting: Family

## 2022-09-30 VITALS — BP 124/84 | HR 75 | Temp 98.0°F | Resp 18 | Ht 76.0 in | Wt 170.0 lb

## 2022-09-30 DIAGNOSIS — D500 Iron deficiency anemia secondary to blood loss (chronic): Secondary | ICD-10-CM | POA: Diagnosis not present

## 2022-09-30 DIAGNOSIS — D45 Polycythemia vera: Secondary | ICD-10-CM | POA: Diagnosis not present

## 2022-09-30 DIAGNOSIS — Z7982 Long term (current) use of aspirin: Secondary | ICD-10-CM | POA: Diagnosis not present

## 2022-09-30 LAB — CBC WITH DIFFERENTIAL (CANCER CENTER ONLY)
Abs Immature Granulocytes: 0.05 10*3/uL (ref 0.00–0.07)
Basophils Absolute: 0.1 10*3/uL (ref 0.0–0.1)
Basophils Relative: 1 %
Eosinophils Absolute: 0.1 10*3/uL (ref 0.0–0.5)
Eosinophils Relative: 2 %
HCT: 45.7 % (ref 39.0–52.0)
Hemoglobin: 13.2 g/dL (ref 13.0–17.0)
Immature Granulocytes: 1 %
Lymphocytes Relative: 26 %
Lymphs Abs: 1.8 10*3/uL (ref 0.7–4.0)
MCH: 18.3 pg — ABNORMAL LOW (ref 26.0–34.0)
MCHC: 28.9 g/dL — ABNORMAL LOW (ref 30.0–36.0)
MCV: 63.2 fL — ABNORMAL LOW (ref 80.0–100.0)
Monocytes Absolute: 0.8 10*3/uL (ref 0.1–1.0)
Monocytes Relative: 12 %
Neutro Abs: 4 10*3/uL (ref 1.7–7.7)
Neutrophils Relative %: 58 %
Platelet Count: 415 10*3/uL — ABNORMAL HIGH (ref 150–400)
RBC: 7.23 MIL/uL — ABNORMAL HIGH (ref 4.22–5.81)
RDW: 24 % — ABNORMAL HIGH (ref 11.5–15.5)
WBC Count: 6.8 10*3/uL (ref 4.0–10.5)
nRBC: 0 % (ref 0.0–0.2)

## 2022-09-30 LAB — CMP (CANCER CENTER ONLY)
ALT: 7 U/L (ref 0–44)
AST: 13 U/L — ABNORMAL LOW (ref 15–41)
Albumin: 4.6 g/dL (ref 3.5–5.0)
Alkaline Phosphatase: 46 U/L (ref 38–126)
Anion gap: 9 (ref 5–15)
BUN: 15 mg/dL (ref 8–23)
CO2: 29 mmol/L (ref 22–32)
Calcium: 10 mg/dL (ref 8.9–10.3)
Chloride: 103 mmol/L (ref 98–111)
Creatinine: 1.05 mg/dL (ref 0.61–1.24)
GFR, Estimated: >60 mL/min (ref >60)
Glucose, Bld: 141 mg/dL — ABNORMAL HIGH (ref 70–99)
Potassium: 4.9 mmol/L (ref 3.5–5.1)
Sodium: 141 mmol/L (ref 135–145)
Total Bilirubin: 0.6 mg/dL (ref 0.3–1.2)
Total Protein: 8 g/dL (ref 6.5–8.1)

## 2022-09-30 LAB — IRON AND IRON BINDING CAPACITY (CC-WL,HP ONLY)
Iron: 33 ug/dL — ABNORMAL LOW (ref 45–182)
Saturation Ratios: 7 % — ABNORMAL LOW (ref 17.9–39.5)
TIBC: 444 ug/dL (ref 250–450)
UIBC: 411 ug/dL — ABNORMAL HIGH (ref 117–376)

## 2022-09-30 LAB — FERRITIN: Ferritin: 6 ng/mL — ABNORMAL LOW (ref 24–336)

## 2022-09-30 NOTE — Progress Notes (Signed)
[name] presents today for phlebotomy per MD orders. Phlebotomy procedure started at 1338 and ended at 1406 . 325 grams removed.Patient observed for 30 minutes after procedure without any incident. Patient tolerated procedure well. IV needle removed intact.

## 2022-09-30 NOTE — Patient Instructions (Signed)

## 2022-09-30 NOTE — Progress Notes (Signed)
Hematology and Oncology Follow Up Visit  [name] [id number] 04-16-1951 72 y.o. 09/30/2022   Principle Diagnosis:  Polycythemia vera- JAK2 (+)   Current Therapy:        Phlebotomy to maintain hematocrit less than 45% Aspirin 325 mg PO daily - on full dose for cardiac issues   Interim History:  [name] is here today for follow-up. He is doing quite well and has no complaints at this time.  He is taking his full dose aspirin daily.  Hct today is 45.7%.  No blood loss, bruising or petechiae noted.  No fever, chills, n/v, cough, rash, dizziness, SOB, chest pain, palpitations, abdominal pain or changes in bowel or bladder habits.  No swelling, tenderness, numbness or tingling in his extremities at this time.  No falls or syncope.  Appetite and hydration are good. Weight is stable at 170 lbs.   ECOG Performance Status: 0 - Asymptomatic  Medications:  Allergies as of 09/30/2022       Reactions   Lisinopril Other (See Comments)   Hyperkalemia   Hydrochlorothiazide Hives   Amlodipine Besylate Other (See Comments)   REACTION: ? caused left axillary nodules/chest flutter   Farxiga [dapagliflozin] Rash, Other (See Comments)   "swollen penis"        Medication List        Accurate as of September 30, 2022  1:11 PM. If you have any questions, ask your nurse or doctor.          acetaminophen 500 MG tablet Commonly known as: TYLENOL Take 1,000 mg by mouth every 6 (six) hours as needed for moderate pain or headache.   amoxicillin 500 MG capsule Commonly known as: AMOXIL TAKE 4 CAPSULES 1 HOUR BEFORE DENTAL PROCEDURE.   aspirin EC 325 MG tablet Take 1 tablet (325 mg total) by mouth daily.   BD Pen Needle Nano 2nd Gen 32G X 4 MM Misc Generic drug: Insulin Pen Needle Inject 1 Device into the skin in the morning, at noon, in the evening, and at bedtime.   cetirizine 10 MG tablet Commonly known as: ZYRTEC Take 10 mg by mouth daily as needed for allergies.    Entresto 97-103 MG Generic drug: sacubitril-valsartan Take 1 tablet by mouth 2 (two) times daily.   glucose blood test strip Commonly known as: Prodigy No Coding Blood Gluc Use as instructed to check blood sugar twice a day.  DX  E11.40   hydrALAZINE 25 MG tablet Commonly known as: APRESOLINE TAKE 1 TABLET BY MOUTH THREE TIMES A DAY   isosorbide mononitrate 30 MG 24 hr tablet Commonly known as: IMDUR Take 15 mg by mouth daily.   Lantus SoloStar 100 UNIT/ML Solostar Pen Generic drug: insulin glargine Inject 18 Units into the skin daily.   metFORMIN 1000 MG tablet Commonly known as: GLUCOPHAGE Take 1 tablet (1,000 mg total) by mouth 2 (two) times daily with a meal.   metoprolol succinate 50 MG 24 hr tablet Commonly known as: TOPROL-XL TAKE 1 TABLET BY MOUTH EVERY DAY WITH OR IMMEDIATELY FOLLOWING A MEAL   NovoLOG FlexPen 100 UNIT/ML FlexPen Generic drug: insulin aspart Max daily 40 units   rosuvastatin 20 MG tablet Commonly known as: CRESTOR TAKE 1 TABLET BY MOUTH EVERY DAY   spironolactone 25 MG tablet Commonly known as: ALDACTONE TAKE 1/2 TABLET BY MOUTH EVERY DAY   VITAMIN B-12 PO Take 1 tablet by mouth daily.        Allergies:  Allergies  Allergen Reactions  Lisinopril Other (See Comments)    Hyperkalemia    Hydrochlorothiazide Hives   Amlodipine Besylate Other (See Comments)    REACTION: ? caused left axillary nodules/chest flutter   Farxiga [Dapagliflozin] Rash and Other (See Comments)    "swollen penis"    Past Medical History, Surgical history, Social history, and Family History were reviewed and updated.  Review of Systems: All other 10 point review of systems is negative.   Physical Exam:  vitals were not taken for this visit.   Wt Readings from Last 3 Encounters:  06/05/22 172 lb 8 oz (78.2 kg)  05/15/22 174 lb 9.6 oz (79.2 kg)  05/13/22 174 lb (78.9 kg)    Ocular: Sclerae unicteric, pupils equal, round and reactive to  light Ear-nose-throat: Oropharynx clear, dentition fair Lymphatic: No cervical or supraclavicular adenopathy Lungs no rales or rhonchi, good excursion bilaterally Heart regular rate and rhythm, no murmur appreciated Abd soft, nontender, positive bowel sounds MSK no focal spinal tenderness, no joint edema Neuro: non-focal, well-oriented, appropriate affect Breasts: Deferred   Lab Results  Component Value Date   WBC 8.6 03/29/2022   HGB 12.8 (L) 03/29/2022   HCT 43.6 03/29/2022   MCV 62.1 (L) 03/29/2022   PLT 388 03/29/2022   Lab Results  Component Value Date   FERRITIN 5 (L) 03/29/2022   IRON 27 (L) 03/29/2022   TIBC 449 03/29/2022   UIBC 422 (H) 03/29/2022   IRONPCTSAT 6 (L) 03/29/2022   Lab Results  Component Value Date   RETICCTPCT 1.1 08/30/2021   RBC 7.02 (H) 03/29/2022   No results found for: "KPAFRELGTCHN", "LAMBDASER", "KAPLAMBRATIO" No results found for: "IGGSERUM", "IGA", "IGMSERUM" No results found for: "TOTALPROTELP", "ALBUMINELP", "A1GS", "A2GS", "BETS", "BETA2SER", "GAMS", "MSPIKE", "SPEI"   Chemistry      Component Value Date/Time   NA 138 03/29/2022 1307   NA 141 11/12/2021 0959   K 4.4 03/29/2022 1307   CL 103 03/29/2022 1307   CO2 27 03/29/2022 1307   BUN 13 03/29/2022 1307   BUN 12 11/12/2021 0959   CREATININE 0.94 03/29/2022 1307   CREATININE 0.84 11/30/2013 0856      Component Value Date/Time   CALCIUM 9.9 03/29/2022 1307   ALKPHOS 42 03/29/2022 1307   AST 12 (L) 03/29/2022 1307   ALT 7 03/29/2022 1307   BILITOT 0.5 03/29/2022 1307       Impression and Plan: [name] is a very pleasant 72 yo African American gentleman with polycythemia vera, JAK 2 positive.  We will proceed with partial phlebotomy today, Hct 45.7%.  Follow-up in 6 months.  Lottie Dawson, NP 2/5/20241:11 PM

## 2022-10-01 ENCOUNTER — Encounter: Payer: Self-pay | Admitting: Cardiology

## 2022-10-01 MED ORDER — AMOXICILLIN 500 MG PO CAPS: ORAL_CAPSULE | ORAL | 0 refills | Status: DC

## 2022-10-07 ENCOUNTER — Other Ambulatory Visit: Payer: Self-pay | Admitting: Internal Medicine

## 2022-10-13 DIAGNOSIS — J438 Other emphysema: Secondary | ICD-10-CM | POA: Diagnosis not present

## 2022-10-13 DIAGNOSIS — J449 Chronic obstructive pulmonary disease, unspecified: Secondary | ICD-10-CM | POA: Diagnosis not present

## 2022-10-13 DIAGNOSIS — J9610 Chronic respiratory failure, unspecified whether with hypoxia or hypercapnia: Secondary | ICD-10-CM | POA: Diagnosis not present

## 2022-10-14 ENCOUNTER — Telehealth: Payer: Self-pay | Admitting: Cardiology

## 2022-10-14 NOTE — Telephone Encounter (Signed)
Called pt he states he went for a cleaning around 2/6 and he found some places they want to fill. His appt is 2/21. He wants to know if he can have amoxicillin again. Will get message to Dr. Harriet Masson for review.

## 2022-10-14 NOTE — Telephone Encounter (Signed)
Pt c/o medication issue:  1. Name of Medication:   amoxicillin (AMOXIL) 500 MG capsule    2. How are you currently taking this medication (dosage and times per day)?   3. Are you having a reaction (difficulty breathing--STAT)?   4. What is your medication issue? Patient states that they are needing this antibiotic called in before their procedure on 02/21.

## 2022-10-15 ENCOUNTER — Other Ambulatory Visit: Payer: Self-pay

## 2022-10-15 MED ORDER — AMOXICILLIN 500 MG PO CAPS: ORAL_CAPSULE | ORAL | 2 refills | Status: DC

## 2022-10-15 NOTE — Telephone Encounter (Signed)
Called pt to let him know the prescription was sent into the pharmacy. No answer, left message o nmachine. Prescription sent to pharmacy.

## 2022-10-15 NOTE — Telephone Encounter (Signed)
Prescription sent to pharmacy. Called pt, no answer. Left message.

## 2022-10-23 ENCOUNTER — Other Ambulatory Visit (HOSPITAL_COMMUNITY): Payer: Self-pay | Admitting: Internal Medicine

## 2022-11-11 DIAGNOSIS — J9610 Chronic respiratory failure, unspecified whether with hypoxia or hypercapnia: Secondary | ICD-10-CM | POA: Diagnosis not present

## 2022-11-11 DIAGNOSIS — J438 Other emphysema: Secondary | ICD-10-CM | POA: Diagnosis not present

## 2022-11-11 DIAGNOSIS — J449 Chronic obstructive pulmonary disease, unspecified: Secondary | ICD-10-CM | POA: Diagnosis not present

## 2022-11-12 ENCOUNTER — Ambulatory Visit: Payer: Medicare PPO | Admitting: Internal Medicine

## 2022-11-12 ENCOUNTER — Encounter: Payer: Self-pay | Admitting: Internal Medicine

## 2022-11-12 VITALS — BP 122/82 | HR 84 | Ht 76.0 in | Wt 172.0 lb

## 2022-11-12 DIAGNOSIS — Z794 Long term (current) use of insulin: Secondary | ICD-10-CM | POA: Diagnosis not present

## 2022-11-12 DIAGNOSIS — E1159 Type 2 diabetes mellitus with other circulatory complications: Secondary | ICD-10-CM

## 2022-11-12 LAB — POCT GLYCOSYLATED HEMOGLOBIN (HGB A1C): Hemoglobin A1C: 7 % — AB (ref 4.0–5.6)

## 2022-11-12 MED ORDER — LANTUS SOLOSTAR 100 UNIT/ML ~~LOC~~ SOPN: 18.0000 [IU] | PEN_INJECTOR | Freq: Every day | SUBCUTANEOUS | 11 refills | Status: DC

## 2022-11-12 MED ORDER — BD PEN NEEDLE NANO 2ND GEN 32G X 4 MM MISC: 1.0000 | Freq: Four times a day (QID) | 3 refills | Status: DC

## 2022-11-12 MED ORDER — METFORMIN HCL 1000 MG PO TABS: 1000.0000 mg | ORAL_TABLET | Freq: Two times a day (BID) | ORAL | 3 refills | Status: DC

## 2022-11-12 MED ORDER — NOVOLOG FLEXPEN 100 UNIT/ML ~~LOC~~ SOPN: PEN_INJECTOR | SUBCUTANEOUS | 4 refills | Status: DC

## 2022-11-12 NOTE — Patient Instructions (Addendum)
-   Continue  Metformin 1000 mg, 1 tablet TWICE a day  - Continue  Lantus 18 units daily  - Continue  NOVOLOG 8 units with each meal  - Novlog correctional insulin: ADD extra units on insulin to your meal-time Novolog  dose if your blood sugars are higher than 185. Use the scale below to help guide you:    Blood sugar before meal Number of units to inject  Less than 185 0 unit  186 - 225 1 units  226 -  265 2 units  265 -  305 3 units  306 -  345 4 units

## 2022-11-12 NOTE — Progress Notes (Signed)
Name: [name]  Age/ Sex: 72 y.o., male   MRN/ DOB: [id number], May 06, 1951     PCP: [name] Branch, MD   Reason for Endocrinology Evaluation: Type 2 Diabetes Mellitus  Initial Endocrine Consultative Visit: 04/19/2020    PATIENT IDENTIFIER: [name] is a 72 y.o. male with a past medical history of COPD, non-obstructive CAD, severe AS ( S/P AVR/CABG), CHF, HTN , T2DM and OSA. The patient has followed with Endocrinology clinic since 04/19/2020 for consultative assistance with management of his diabetes.  DIABETIC HISTORY:  [name] was diagnosed with DM in 2009. He has been on metformin and insulin for years.Metfromin dose was reduced by 50% due to hypoglycemia per pt.  His hemoglobin A1c has ranged from 7.2% in 2021, peaking at 8.8%in 2018  On his initial visit to our clinic his A1c was 7.2 % We adjusted MDI regimen and continued metformin   Due to a low BMI of 20.99 we checked GAD-65 and islet cell Ab's which were negative    Prandial insulin stopped 10/2020 but by his 01/2021 he was already back on it and we continued   [name] stopped due to genital rash 01/2021  SUBJECTIVE:   During the last visit (06/26/2021): A1c 7.5 %   Today (11/12/2022): [name] is here for a follow up on diabetes. He checks his blood sugars a 2-3  times daily. The patient has not  had hypoglycemic episodes since the last clinic visit.  Continues to follow with hematology for polycythemia vera  Denies nausea, vomiting or diarrhea    HOME DIABETES REGIMEN:  Lantus 18 units daily  Metformin 1000 mg , BID Novolog 8 units with each meal [id number] (BG- 135/40)     Statin: yes ACE-I/ARB: yes   METER DOWNLOAD SUMMARY: Prodigy unable to download    89- 168mg /dL   DIABETIC COMPLICATIONS: Microvascular complications:  Retinopathy Denies: CKD , neuropathy  Last eye exam: Completed 2023   Macrovascular complications:  CAD Denies: PVD, CVA   HISTORY:  Past Medical  History:  Past Medical History:  Diagnosis Date   Abnormal myocardial perfusion study 2006   EF 44% ? inferoseptal ischemia. no cath done   Benign neoplasm of cecum    Breast mass, right 03/2008   Cardiomyopathy ([place]) 07/20/2018   Ejection fraction 4045% in September 2019   Centrilobular emphysema ([place]) 08/25/2018   02/04/2018-high-res CT- no evidence of interstitial lung disease, severe centrilobular emphysema and mild diffuse bronchial wall thickening suggesting COPD, no calcified pleural plaques no pleural effusion, mild smooth bilateral pleural thickening which is nonspecific  02/04/2018-pulmonary function test- FVC 3.37 (71% predicted), postbronchodilator ratio 50, postbronchodilator FEV1 54, mid flow reve   CHF (congestive heart failure) ([facility])    COLONIC POLYPS, HX OF 03/18/2007   Qualifier: Diagnosis of  By: [name]    Contact dermatitis 01/31/2013   COPD (chronic obstructive pulmonary disease) ([place])    "dx'd 12/2017"   COPD GOLD II A  08/25/2018   02/04/2018-high-res CT- no evidence of interstitial lung disease, severe centrilobular emphysema and mild diffuse bronchial wall thickening suggesting COPD, no calcified pleural plaques no pleural effusion, mild smooth bilateral pleural thickening which is nonspecific  02/04/2018-pulmonary function test- FVC 3.37 (71% predicted), postbronchodilator ratio 50, postbronchodilator FEV1 54, mid flow reve   Coronary artery disease    Critical aortic valve stenosis 01/12/2018   Calculated valve area less than 0.5 cm mean gradient more than 60 mm of mercury   DEGENERATIVE  JOINT DISEASE, BACK 03/18/2007   Qualifier: Diagnosis of  By: [name]    Depressed left ventricular ejection fraction 03/28/2020   Diabetes mellitus ([place]) 07/25/2020   Dyslipidemia 04/20/2020   Dyspnea on exertion 01/12/2018   ERECTILE DYSFUNCTION, ORGANIC 02/10/2009   Qualifier: Diagnosis of  By: [name]    Former cigarette smoker 10/12/2018   Quit in 2012  41-pack-year smoking history   Goals of care, counseling/discussion 08/24/2020   H/O aortic valve replacement 05/05/2020   Heart murmur    HEMORRHOIDS, INTERNAL 03/18/2007   Qualifier: Diagnosis of  By: [name]    HTN (hypertension) 10/21/2007        Hyperlipemia 10/21/2007   Qualifier: Diagnosis of  By: [name]    Hypertension    Hypoglycemia due to insulin 03/26/2020   Low back pain    MYOCARDIAL PERFUSION SCAN, WITH STRESS TEST, ABNORMAL 08/11/2008   Qualifier: Diagnosis of  By: [name] Cancel, MD, [name]    Nocturnal hypoxemia 08/25/2018   04/09/18 >>> ONO - telephone note>>> Patient's ONO on room air showed that he was less than 88% for more than an hour. Patient needs to be on 2 L at HS.  10/06/2018-overnight oximetry-entire duration was 4 hours and 23 minutes, SPO2 less than 88% for 25 minutes and 32 seconds     Oxygen deficiency    uses 2L at night   Paroxysmal atrial fibrillation ([place]) 10/12/2018   PCP NOTES >>>>>>>>>>>>>>>>> 05/14/2020   Polycythemia vera ([place]) 08/04/2020   S/P AVR 03/04/2018   AVR 23 mm Edwards Magna Valve - bioprostetic 03/04/2018 LIMA - LAD   Status post coronary artery bypass graft 03/20/2018   LIMA to LAD during aortic valve replacement surgery 03/04/2018   Type 2 diabetes mellitus with hyperglycemia, with long-term current use of insulin ([place]) 04/20/2020   Type 2 diabetes mellitus, uncontrolled, with neuropathy 05/20/2008   Qualifier: Diagnosis of  By: [name]    Past Surgical History:  Past Surgical History:  Procedure Laterality Date   ABDOMINAL AORTOGRAM N/A 01/13/2018   Procedure: ABDOMINAL AORTOGRAM;  Surgeon: [name] Sine, MD;  Location: [facility];  Service: Cardiovascular;  Laterality: N/A;   AORTIC VALVE REPLACEMENT N/A 03/04/2018   Procedure: AORTIC VALVE REPLACEMENT (AVR) 28mm Edwards Magna Ease Tissue Valve.;  Surgeon: [name] Nakayama, MD;  Location: [facility];  Service: Open Heart Surgery;  Laterality:  N/A;   CARDIAC VALVE REPLACEMENT     COLONOSCOPY W/ POLYPECTOMY     COLONOSCOPY WITH PROPOFOL N/A 02/22/2020   Procedure: COLONOSCOPY WITH PROPOFOL;  Surgeon: [name] Stabler, MD;  Location: [facility];  Service: Gastroenterology;  Laterality: N/A;   CORONARY ARTERY BYPASS GRAFT N/A 03/04/2018   Procedure: CORONARY ARTERY BYPASS GRAFTING (CABG) x1:  LIMA to LAD.;  Surgeon: [name] Nakayama, MD;  Location: [facility];  Service: Open Heart Surgery;  Laterality: N/A;   HEMOSTASIS CLIP PLACEMENT  02/22/2020   Procedure: HEMOSTASIS CLIP PLACEMENT;  Surgeon: [name] Stabler, MD;  Location: [facility];  Service: Gastroenterology;;   INTRAVASCULAR PRESSURE WIRE/FFR STUDY N/A 05/10/2020   Procedure: INTRAVASCULAR PRESSURE WIRE/FFR STUDY;  Surgeon: Sherren Mocha, MD;  Location: [facility];  Service: Cardiovascular;  Laterality: N/A;   LEFT HEART CATH AND CORS/GRAFTS ANGIOGRAPHY N/A 05/10/2020   Procedure: LEFT HEART CATH AND CORS/GRAFTS ANGIOGRAPHY;  Surgeon: Sherren Mocha, MD;  Location: [facility];  Service: Cardiovascular;  Laterality: N/A;   LIPOMA EXCISION Left 07/2008    "back of shoulder" Dr Gershon Crane   POLYPECTOMY  02/22/2020   Procedure: POLYPECTOMY;  Surgeon: [name] Stabler, MD;  [facility];  Service: Gastroenterology;;   RIGHT/LEFT HEART CATH AND CORONARY ANGIOGRAPHY N/A 01/13/2018   Procedure: RIGHT/LEFT HEART CATH AND CORONARY ANGIOGRAPHY;  Surgeon: [name] Sine, MD;  Location: [facility];  Service: Cardiovascular;  Laterality: N/A;   TEE WITHOUT CARDIOVERSION N/A 03/04/2018   Procedure: TRANSESOPHAGEAL ECHOCARDIOGRAM (TEE);  Surgeon: [name] Nakayama, MD;  Location: [facility];  Service: Open Heart Surgery;  Laterality: N/A;   TONSILLECTOMY     WISDOM TOOTH EXTRACTION     Social History:  reports that he quit smoking about 24 years ago. His smoking use included cigarettes. He started smoking about 51 years ago. He smoked an average of 1.5 packs  per day. He has never used smokeless tobacco. He reports current alcohol use. He reports that he does not currently use drugs. Family History:  Family History  Problem Relation Age of Onset   Melanoma Father 17       deceased secondary to melanoma   Alzheimer's disease Father    Diabetes Father    Hypertension Mother        alive -28   [name] cancer Neg Hx    Esophageal cancer Neg Hx    [name] polyps Neg Hx    Stomach cancer Neg Hx      HOME MEDICATIONS: Allergies as of 11/12/2022       Reactions   Farxiga [dapagliflozin] Rash, Other (See Comments)   "swollen penis"   Lisinopril Other (See Comments)   Hyperkalemia   Amlodipine Besylate Other (See Comments)   REACTION: ? caused left axillary nodules/chest flutter   Hydrochlorothiazide Hives        Medication List        Accurate as of November 12, 2022 11:14 AM. If you have any questions, ask your nurse or doctor.          acetaminophen 500 MG tablet Commonly known as: TYLENOL Take 1,000 mg by mouth every 6 (six) hours as needed for moderate pain or headache.   amoxicillin 500 MG capsule Commonly known as: AMOXIL TAKE 4 CAPSULES 1 HOUR BEFORE DENTAL PROCEDURE.   aspirin EC 325 MG tablet Take 1 tablet (325 mg total) by mouth daily.   BD Pen Needle Nano 2nd Gen 32G X 4 MM Misc Generic drug: Insulin Pen Needle Inject 1 Device into the skin in the morning, at noon, in the evening, and at bedtime.   cetirizine 10 MG tablet Commonly known as: ZYRTEC Take 10 mg by mouth daily as needed for allergies.   Entresto 97-103 MG Generic drug: sacubitril-valsartan Take 1 tablet by mouth 2 (two) times daily.   glucose blood test strip Commonly known as: Prodigy No Coding Blood Gluc Use as instructed to check blood sugar twice a day.  DX  E11.40   hydrALAZINE 25 MG tablet Commonly known as: APRESOLINE TAKE 1 TABLET BY MOUTH THREE TIMES A DAY   isosorbide mononitrate 30 MG 24 hr tablet Commonly known as: IMDUR Take 15  mg by mouth daily.   Lantus SoloStar 100 UNIT/ML Solostar Pen Generic drug: insulin glargine Inject 18 Units into the skin daily.   metFORMIN 1000 MG tablet Commonly known as: GLUCOPHAGE Take 1 tablet (1,000 mg total) by mouth 2 (two) times daily with a meal.   metoprolol succinate 50  MG 24 hr tablet Commonly known as: TOPROL-XL Take 1 tablet (50 mg total) by mouth daily. Please call for office visit [phone]   NovoLOG FlexPen 100 UNIT/ML FlexPen Generic drug: insulin aspart Max daily 40 units   rosuvastatin 20 MG tablet Commonly known as: CRESTOR TAKE 1 TABLET BY MOUTH EVERY DAY   spironolactone 25 MG tablet Commonly known as: ALDACTONE TAKE 1/2 TABLET BY MOUTH EVERY DAY   VITAMIN B-12 PO Take 1 tablet by mouth daily.         OBJECTIVE:   Vital Signs: BP 122/82 (BP Location: Left Arm, Patient Position: Sitting, Cuff Size: Small)   Pulse 84   Ht 6\' 4"  (1.93 m)   Wt 172 lb (78 kg)   SpO2 95%   BMI 20.94 kg/m   Wt Readings from Last 3 Encounters:  11/12/22 172 lb (78 kg)  09/30/22 170 lb (77.1 kg)  06/05/22 172 lb 8 oz (78.2 kg)     Exam: General: Pt appears well and is in NAD  Lungs: Clear with good BS bilat  Heart: RRR   Extremities: No pretibial edema.   Neuro: MS is good with appropriate affect, pt is alert and Ox3       DM Foot Exam 05/13/2022  The skin of the feet is intact without sores or ulcerations. The pedal pulses are 1+ on right and 1+ on left. The sensation is intact to a screening 5.07, 10 gram monofilament bilaterally    DATA REVIEWED:  Lab Results  Component Value Date   HGBA1C 7.0 (A) 11/12/2022   HGBA1C 7.5 (A) 05/13/2022   HGBA1C 7.5 (A) 06/26/2021    Latest Reference Range & Units 09/30/22 12:58  Sodium 135 - 145 mmol/L 141  Potassium 3.5 - 5.1 mmol/L 4.9  Chloride 98 - 111 mmol/L 103  CO2 22 - 32 mmol/L 29  Glucose 70 - 99 mg/dL 141 (H)  BUN 8 - 23 mg/dL 15  Creatinine 0.61 - 1.24 mg/dL 1.05  Calcium 8.9 - 10.3  mg/dL 10.0  Anion gap 5 - 15  9  Alkaline Phosphatase 38 - 126 U/L 46  Albumin 3.5 - 5.0 g/dL 4.6  AST 15 - 41 U/L 13 (L)  ALT 0 - 44 U/L 7  Total Protein 6.5 - 8.1 g/dL 8.0  Total Bilirubin 0.3 - 1.2 mg/dL 0.6  GFR, Est Non African American >60 mL/min >60    ISLET CELL ANTIBODY SCREEN NEGATIVE NEGATIVE    Glutamic Acid Decarb Ab <5 IU/mL <5      ASSESSMENT / PLAN / RECOMMENDATIONS:   1) Type 2 Diabetes Mellitus, Optimally controlled, With macrovascular  complications - Most recent A1c of 7.0 %. Goal A1c < 7.0 %.     - A1c at goal  - He had to stop the [place] due to severe genital infection/irritation - He declined CGM technology as he is concerned he will knock it off with the type of the work he does, I do believe this technology will help improve his glycemic control , today he was offered a Dexcom G7 sensor so he can try it at home, I did go over the steps with him - He had declined GLP-1 agonists therapy  - He is comfortable using insulin and is not having side effects from it  and does not wish to change it -No changes  MEDICATIONS:  - Continue Metformin 1000 mg, 1 tablet BID - Continue  Lantus  18 units daily  - Continue Novolog 8  units with each meal  - [id number] (BG- 135/40)  EDUCATION / INSTRUCTIONS: BG monitoring instructions: Patient is instructed to check his blood sugars 3 times a day, before meals  Call [facility] if: BG persistently < 100  I reviewed the Rule of 15 for the treatment of hypoglycemia in detail with the patient. Literature supplied.     2) Diabetic complications:  Eye: Does not have known diabetic retinopathy.  Neuro/ Feet: Does not have known diabetic peripheral neuropathy .  Renal: Patient does not have known baseline CKD. He   is  on an ACEI/ARB at present.   F/U in 6 months    Signed electronically by: Mack Guise, MD  [facility]  [facility] [address] [place], [place] [address] Phone: [phone] FAX: [phone]   CC: [name], [address] [place] [address] Phone: [phone]  Fax: [phone]  Return to Endocrinology clinic as below: Future Appointments  Date Time Provider [place]  11/13/2022 10:00 AM Tobb, Kardie, DO [facility] None  03/31/2023  1:15 PM [facility] LAB [facility] None  03/31/2023  1:30 PM Celso Amy, NP [facility] None  03/31/2023  2:00 PM [facility] A2 [facility] None  04/10/2023  8:20 AM [facility] ANNUAL WELLNESS VISIT 1 [facility]

## 2022-11-13 ENCOUNTER — Ambulatory Visit: Payer: Medicare PPO | Attending: Cardiology | Admitting: Cardiology

## 2022-11-13 ENCOUNTER — Encounter: Payer: Self-pay | Admitting: Cardiology

## 2022-11-13 VITALS — BP 119/71 | HR 74 | Ht 76.0 in | Wt 170.0 lb

## 2022-11-13 DIAGNOSIS — I2510 Atherosclerotic heart disease of native coronary artery without angina pectoris: Secondary | ICD-10-CM | POA: Diagnosis not present

## 2022-11-13 DIAGNOSIS — I480 Paroxysmal atrial fibrillation: Secondary | ICD-10-CM

## 2022-11-13 DIAGNOSIS — E0800 Diabetes mellitus due to underlying condition with hyperosmolarity without nonketotic hyperglycemic-hyperosmolar coma (NKHHC): Secondary | ICD-10-CM

## 2022-11-13 DIAGNOSIS — I10 Essential (primary) hypertension: Secondary | ICD-10-CM

## 2022-11-13 DIAGNOSIS — I255 Ischemic cardiomyopathy: Secondary | ICD-10-CM

## 2022-11-13 NOTE — Patient Instructions (Signed)
Medication Instructions:  Your physician recommends that you continue on your current medications as directed. Please refer to the Current Medication list given to you today.  *If you need a refill on your cardiac medications before your next appointment, please call your pharmacy*   Lab Work: None   Testing/Procedures: None   Follow-Up: At [facility], you and your health needs are our priority.  As part of our continuing mission to provide you with exceptional heart care, we have created designated Provider Care Teams.  These Care Teams include your primary Cardiologist (physician) and Advanced Practice Providers (APPs -  Physician Assistants and Nurse Practitioners) who all work together to provide you with the care you need, when you need it.   Your next appointment:   9 month(s)  Provider:   Kardie Tobb, DO   

## 2022-11-13 NOTE — Progress Notes (Signed)
Cardiology Office Note:    Date:  11/13/2022   ID:  [name], DOB 01/21/1951, MRN [id number]  PCP:  Colon Branch, MD  Cardiologist:  Berniece Salines, DO  Electrophysiologist:  None   Referring MD: Colon Branch, MD   " I am doing fine"  History of Present Illness:    [name] is a 72 y.o. male with a hx of  coronary artery disease status post CABG in 2019, ischemic cardiomyopathy most recent EF on March 27, 2020 at which time his EF was reported to be 25 to 30%, Status post aortic valve replacement with a 23 Edwards valve also in July 2019, hypertension hyperlipidemia and diabetes.  Polycythemia vera and is undergoing phlebotomy.   I saw the patient on April 05, 2020 at that time we kept him on his losartan as well as his beta-blocker and added Aldactone.    When I saw the patient 1 May 05, 2020 he still with experiencing significant fatigue and shortness of breath.  Given his history of  CAD I recommended he undergo a left heart catheterization to make sure that worsening/progression of CAD was not causing symptoms.  He did undergo left heart catheterization there is no need for a stent placement.   I saw the patient on August 14, 2020 at that time we will continue his beta-blocker, increase his Entresto to 49-51 twice daily, continue Aldactone 12.5, Hydralazine 10 mg 3 times daily and Farxiga 10 mg daily.  We discussed his cardiac MRI which show EF of 25%.   I saw the patient in March 2020 we discussed his left heart catheterization results.  With a concern for we will continue his medical therapy and was pending his echocardiogram.  His visit on May 30, 2021 at that time he was hypertensive so I increase his hydralazine to 25 mg 3 times a day.  We talked about his improved EF.  I saw the patient March 2023, at that time he was doing well clinically from a cardiovascular standpoint.  We will plan a repeat echocardiogram for his next visit.  He is here today for  follow-up visit.  At his last visit he is doing well from a cardiovascular standpoint.  He has no other complaints at this time.  Since his last visit he has had a dental procedure he is doing well.   Past Medical History:  Diagnosis Date   Abnormal myocardial perfusion study 2006   EF 44% ? inferoseptal ischemia. no cath done   Benign neoplasm of cecum    Breast mass, right 03/2008   Cardiomyopathy ([place]) 07/20/2018   Ejection fraction 4045% in September 2019   Centrilobular emphysema ([place]) 08/25/2018   02/04/2018-high-res CT- no evidence of interstitial lung disease, severe centrilobular emphysema and mild diffuse bronchial wall thickening suggesting COPD, no calcified pleural plaques no pleural effusion, mild smooth bilateral pleural thickening which is nonspecific  02/04/2018-pulmonary function test- FVC 3.37 (71% predicted), postbronchodilator ratio 50, postbronchodilator FEV1 54, mid flow reve   CHF (congestive heart failure) ([place])    COLONIC POLYPS, HX OF 03/18/2007   Qualifier: Diagnosis of  By: [name]    Contact dermatitis 01/31/2013   COPD (chronic obstructive pulmonary disease) ([place])    "dx'd 12/2017"   COPD GOLD II A  08/25/2018   02/04/2018-high-res CT- no evidence of interstitial lung disease, severe centrilobular emphysema and mild diffuse bronchial wall thickening suggesting COPD, no calcified pleural plaques no pleural effusion, mild smooth  bilateral pleural thickening which is nonspecific  02/04/2018-pulmonary function test- FVC 3.37 (71% predicted), postbronchodilator ratio 50, postbronchodilator FEV1 54, mid flow reve   Coronary artery disease    Critical aortic valve stenosis 01/12/2018   Calculated valve area less than 0.5 cm mean gradient more than 60 mm of mercury   DEGENERATIVE JOINT DISEASE, BACK 03/18/2007   Qualifier: Diagnosis of  By: [name]    Depressed left ventricular ejection fraction 03/28/2020   Diabetes mellitus ([place]) 07/25/2020    Dyslipidemia 04/20/2020   Dyspnea on exertion 01/12/2018   ERECTILE DYSFUNCTION, ORGANIC 02/10/2009   Qualifier: Diagnosis of  By: [name]    Former cigarette smoker 10/12/2018   Quit in 2012 41-pack-year smoking history   Goals of care, counseling/discussion 08/24/2020   H/O aortic valve replacement 05/05/2020   Heart murmur    HEMORRHOIDS, INTERNAL 03/18/2007   Qualifier: Diagnosis of  By: [name]    HTN (hypertension) 10/21/2007        Hyperlipemia 10/21/2007   Qualifier: Diagnosis of  By: [name]    Hypertension    Hypoglycemia due to insulin 03/26/2020   Low back pain    MYOCARDIAL PERFUSION SCAN, WITH STRESS TEST, ABNORMAL 08/11/2008   Qualifier: Diagnosis of  By: Johnsie Cancel, MD, Rona Ravens    Nocturnal hypoxemia 08/25/2018   04/09/18 >>> ONO - telephone note>>> Patient's ONO on room air showed that he was less than 88% for more than an hour. Patient needs to be on 2 L at HS.  10/06/2018-overnight oximetry-entire duration was 4 hours and 23 minutes, SPO2 less than 88% for 25 minutes and 32 seconds     Oxygen deficiency    uses 2L at night   Paroxysmal atrial fibrillation ([place]) 10/12/2018   PCP NOTES >>>>>>>>>>>>>>>>> 05/14/2020   Polycythemia vera ([place]) 08/04/2020   S/P AVR 03/04/2018   AVR 23 mm Edwards Magna Valve - bioprostetic 03/04/2018 LIMA - LAD   Status post coronary artery bypass graft 03/20/2018   LIMA to LAD during aortic valve replacement surgery 03/04/2018   Type 2 diabetes mellitus with hyperglycemia, with long-term current use of insulin ([place]) 04/20/2020   Type 2 diabetes mellitus, uncontrolled, with neuropathy 05/20/2008   Qualifier: Diagnosis of  By: [name]     Past Surgical History:  Procedure Laterality Date   ABDOMINAL AORTOGRAM N/A 01/13/2018   Procedure: ABDOMINAL AORTOGRAM;  Surgeon: Troy Sine, MD;  Location: [facility];  Service: Cardiovascular;  Laterality: N/A;   AORTIC VALVE REPLACEMENT N/A 03/04/2018    Procedure: AORTIC VALVE REPLACEMENT (AVR) 69mm Edwards Magna Ease Tissue Valve.;  Surgeon: Melrose Nakayama, MD;  Location: [facility];  Service: Open Heart Surgery;  Laterality: N/A;   CARDIAC VALVE REPLACEMENT     COLONOSCOPY W/ POLYPECTOMY     COLONOSCOPY WITH PROPOFOL N/A 02/22/2020   Procedure: COLONOSCOPY WITH PROPOFOL;  Surgeon: Doran Stabler, MD;  [facility]: [facility];  Service: Gastroenterology;  Laterality: N/A;   CORONARY ARTERY BYPASS GRAFT N/A 03/04/2018   Procedure: CORONARY ARTERY BYPASS GRAFTING (CABG) x1:  LIMA to LAD.;  Surgeon: Melrose Nakayama, MD;  Location: [facility];  Service: Open Heart Surgery;  Laterality: N/A;   HEMOSTASIS CLIP PLACEMENT  02/22/2020   Procedure: HEMOSTASIS CLIP PLACEMENT;  Surgeon: Doran Stabler, MD;  [facility]: [facility];  Service: Gastroenterology;;   INTRAVASCULAR PRESSURE WIRE/FFR STUDY N/A 05/10/2020   Procedure: INTRAVASCULAR PRESSURE  WIRE/FFR STUDY;  Surgeon: Sherren Mocha, MD;  Location: [facility];  Service: Cardiovascular;  Laterality: N/A;   LEFT HEART CATH AND CORS/GRAFTS ANGIOGRAPHY N/A 05/10/2020   Procedure: LEFT HEART CATH AND CORS/GRAFTS ANGIOGRAPHY;  Surgeon: Sherren Mocha, MD;  Location: [facility];  Service: Cardiovascular;  Laterality: N/A;   LIPOMA EXCISION Left 07/2008    "back of shoulder" Dr Gershon Crane   POLYPECTOMY  02/22/2020   Procedure: POLYPECTOMY;  Surgeon: Doran Stabler, MD;  [facility];  Service: Gastroenterology;;   RIGHT/LEFT HEART CATH AND CORONARY ANGIOGRAPHY N/A 01/13/2018   Procedure: RIGHT/LEFT HEART CATH AND CORONARY ANGIOGRAPHY;  Surgeon: Troy Sine, MD;  Location: [facility];  Service: Cardiovascular;  Laterality: N/A;   TEE WITHOUT CARDIOVERSION N/A 03/04/2018   Procedure: TRANSESOPHAGEAL ECHOCARDIOGRAM (TEE);  Surgeon: Melrose Nakayama, MD;  Location: [facility];  Service: Open Heart Surgery;  Laterality: N/A;   TONSILLECTOMY     WISDOM TOOTH EXTRACTION       Current Medications: Current Meds  Medication Sig   acetaminophen (TYLENOL) 500 MG tablet Take 1,000 mg by mouth every 6 (six) hours as needed for moderate pain or headache.   amoxicillin (AMOXIL) 500 MG capsule TAKE 4 CAPSULES 1 HOUR BEFORE DENTAL PROCEDURE.   aspirin EC 325 MG EC tablet Take 1 tablet (325 mg total) by mouth daily.   cetirizine (ZYRTEC) 10 MG tablet Take 10 mg by mouth daily as needed for allergies.   Cyanocobalamin (VITAMIN B-12 PO) Take 1 tablet by mouth daily.   glucose blood (PRODIGY NO CODING BLOOD GLUC) test strip Use as instructed to check blood sugar twice a day.  DX  E11.40   hydrALAZINE (APRESOLINE) 25 MG tablet TAKE 1 TABLET BY MOUTH THREE TIMES A DAY   insulin aspart (NOVOLOG FLEXPEN) 100 UNIT/ML FlexPen Max daily 30 units   insulin glargine (LANTUS SOLOSTAR) 100 UNIT/ML Solostar Pen Inject 18 Units into the skin daily.   Insulin Pen Needle (BD PEN NEEDLE NANO 2ND GEN) 32G X 4 MM MISC Inject 1 Device into the skin in the morning, at noon, in the evening, and at bedtime.   isosorbide mononitrate (IMDUR) 30 MG 24 hr tablet Take 15 mg by mouth daily.   metFORMIN (GLUCOPHAGE) 1000 MG tablet Take 1 tablet (1,000 mg total) by mouth 2 (two) times daily with a meal.   metoprolol succinate (TOPROL-XL) 50 MG 24 hr tablet Take 1 tablet (50 mg total) by mouth daily. Please call for office visit [phone]   rosuvastatin (CRESTOR) 20 MG tablet TAKE 1 TABLET BY MOUTH EVERY DAY   sacubitril-valsartan (ENTRESTO) 97-103 MG Take 1 tablet by mouth 2 (two) times daily.   spironolactone (ALDACTONE) 25 MG tablet TAKE 1/2 TABLET BY MOUTH EVERY DAY     Allergies:   Farxiga [dapagliflozin], Lisinopril, Amlodipine besylate, and Hydrochlorothiazide   Social History   Socioeconomic History   Marital status: Married    Spouse name: Not on file   Number of children: 3   Years of education: Not on file   Highest education level: Not on file  Occupational History   Not on file   Tobacco Use   Smoking status: Former    Packs/day: 1.5    Types: Cigarettes    Start date: 58    Quit date: 2000    Years since quitting: 24.2   Smokeless tobacco: Never   Tobacco comments:    Smoked heavy from 60 until 2000, does not  qualify for screening  Vaping Use   Vaping Use: Never used  Substance and Sexual Activity   Alcohol use: Yes    Comment: occasionally   Drug use: Not Currently   Sexual activity: Not Currently  Other Topics Concern   Not on file  Social History Narrative   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   Married    Former Smoker -  33 pack year history   Alcohol use-no     Drug use-no             Social Determinants of Radio broadcast assistant Strain: Low Risk  (03/30/2021)   Overall Financial Resource Strain (CARDIA)    Difficulty of Paying Living Expenses: Not hard at all  Food Insecurity: No Food Insecurity (03/30/2021)   Hunger Vital Sign    Worried About Running Out of Food in the Last Year: Never true    [place] in the Last Year: Never true  Transportation Needs: No Transportation Needs (03/30/2021)   PRAPARE - Hydrologist (Medical): No    Lack of Transportation (Non-Medical): No  Physical Activity: Insufficiently Active (03/30/2021)   Exercise Vital Sign    Days of Exercise per Week: 2 days    Minutes of Exercise per Session: 60 min  Stress: No Stress Concern Present (03/30/2021)   [place]    Feeling of Stress : Not at all  Social Connections: Moderately Integrated (03/30/2021)   Social Connection and Isolation Panel [NHANES]    Frequency of Communication with Friends and Family: More than three times a week    Frequency of Social Gatherings with Friends and Family: More than three times a week    Attends Religious Services: More than 4 times per year    Active Member of [employer] or Organizations: No    Attends Arts administrator: Never    Marital Status: Married     Family History: The patient's family history includes Alzheimer's disease in his father; Diabetes in his father; Hypertension in his mother; Melanoma (age of onset: 50) in his father. There is no history of Colon cancer, Esophageal cancer, Colon polyps, or Stomach cancer.  ROS:   Review of Systems  Constitution: Negative for decreased appetite, fever and weight gain.  HENT: Negative for congestion, ear discharge, hoarse voice and sore throat.   Eyes: Negative for discharge, redness, vision loss in right eye and visual halos.  Cardiovascular: Negative for chest pain, dyspnea on exertion, leg swelling, orthopnea and palpitations.  Respiratory: Negative for cough, hemoptysis, shortness of breath and snoring.   Endocrine: Negative for heat intolerance and polyphagia.  Hematologic/Lymphatic: Negative for bleeding problem. Does not bruise/bleed easily.  Skin: Negative for flushing, nail changes, rash and suspicious lesions.  Musculoskeletal: Negative for arthritis, joint pain, muscle cramps, myalgias, neck pain and stiffness.  Gastrointestinal: Negative for abdominal pain, bowel incontinence, diarrhea and excessive appetite.  Genitourinary: Negative for decreased libido, genital sores and incomplete emptying.  Neurological: Negative for brief paralysis, focal weakness, headaches and loss of balance.  Psychiatric/Behavioral: Negative for altered mental status, depression and suicidal ideas.  Allergic/Immunologic: Negative for HIV exposure and persistent infections.    EKGs/Labs/Other Studies Reviewed:    The following studies were reviewed today:   EKG:  The ekg ordered today demonstrates sinus rhythm, heart rate 82 bpm.  Compared to prior EKG no significant change.  Echo 11/2020 IMPRESSIONS   1. Left ventricular  ejection fraction, by estimation, is 40 to 45%. The left ventricle has mildly decreased function. The left ventricle has no regional  wall motion abnormalities. Left ventricular diastolic parameters are consistent with Grade I diastolic dysfunction (impaired relaxation).   2. Right ventricular systolic function is mildly reduced. The right ventricular size is normal.   3. The mitral valve is normal in structure. Trivial mitral valve regurgitation. No evidence of mitral stenosis.   4. The aortic valve is grossly normal. Aortic valve regurgitation is not visualized. There is a 23 mm Edwards Magna Valve valve present in the aortic position. Procedure Date: 03/13/2018. Valve appears to be functioning normally. Mean gradient across the valve 53mm HG.   5. The inferior vena cava is normal in size with greater than 50%  respiratory variability, suggesting right atrial pressure of 3 mmHg.   FINDINGS   Left Ventricle: Left ventricular ejection fraction, by estimation, is 40 to 45%. The left ventricle has mildly decreased function. The left ventricle has no regional wall motion abnormalities. The left ventricular internal cavity size was normal in size. There is no left ventricular hypertrophy. Abnormal (paradoxical) septal motion consistent with post-operative status. Left ventricular diastolic parameters are consistent with Grade I diastolic dysfunction (impaired relaxation).  Right Ventricle: The right ventricular size is normal. No increase in right ventricular wall thickness. Right ventricular systolic function is mildly reduced.   Left Atrium: Left atrial size was normal in size.   Right Atrium: Right atrial size was normal in size.   Pericardium: There is no evidence of pericardial effusion.    Mitral Valve: The mitral valve is normal in structure. There is mild  thickening of the mitral valve leaflet(s). Trivial mitral valve  regurgitation. No evidence of mitral valve stenosis.   Tricuspid Valve: The tricuspid valve is normal in structure. Tricuspid  valve regurgitation is trivial. No evidence of tricuspid stenosis.   Aortic  Valve: The aortic valve is grossly normal. Aortic valve  regurgitation is not visualized. No aortic stenosis is present. Aortic  valve mean gradient measures 15.0 mmHg. Aortic valve peak gradient  measures 27.0 mmHg. Aortic valve area, by VTI  measures 1.46 cm. There is a 23 mm Edwards Magna Valve valve present in  the aortic position. Procedure Date: 03/13/2018.   Pulmonic Valve: The pulmonic valve was normal in structure. Pulmonic valve  regurgitation is not visualized. No evidence of pulmonic stenosis.   Aorta: The aortic root is normal in size and structure.   Venous: The inferior vena cava is normal in size with greater than 50%  respiratory variability, suggesting right atrial pressure of 3 mmHg.   IAS/Shunts: No atrial level shunt detected by color flow Doppler.    Recent Labs: 09/30/2022: ALT 7; BUN 15; Creatinine 1.05; Hemoglobin 13.2; Platelet Count 415; Potassium 4.9; Sodium 141  Recent Lipid Panel    Component Value Date/Time   CHOL 121 06/05/2022 1015   TRIG 101.0 06/05/2022 1015   HDL 46.50 06/05/2022 1015   CHOLHDL 3 06/05/2022 1015   VLDL 20.2 06/05/2022 1015   LDLCALC 54 06/05/2022 1015   LDLDIRECT 177.6 10/07/2007 1001    Physical Exam:    VS:  BP 119/71 (BP Location: Left Arm, Patient Position: Sitting, Cuff Size: Normal)   Pulse 74   Ht 6\' 4"  (1.93 m)   Wt 77.1 kg   BMI 20.69 kg/m     Wt Readings from Last 3 Encounters:  11/13/22 77.1 kg  11/12/22 78 kg  09/30/22 77.1 kg  GEN: Well nourished, well developed in no acute distress HEENT: Normal NECK: No JVD; No carotid bruits LYMPHATICS: No lymphadenopathy CARDIAC: S1S2 noted,RRR, no murmurs, rubs, gallops RESPIRATORY:  Clear to auscultation without rales, wheezing or rhonchi  ABDOMEN: Soft, non-tender, non-distended, +bowel sounds, no guarding. EXTREMITIES: No edema, No cyanosis, no clubbing MUSCULOSKELETAL:  No deformity  SKIN: Warm and dry NEUROLOGIC:  Alert and oriented x 3,  non-focal PSYCHIATRIC:  Normal affect, good insight  ASSESSMENT:    1. Primary hypertension     PLAN: Exam   Echo in 05/2022 EF had improved now 50-55%. Continue current medication regimen.  Blood pressure is within target.    Coronary artery disease having no angina symptoms.  We will continue his current medication.     The patient is in agreement with the above plan. The patient left the office in stable condition.  The patient will follow up in 19months or sooner if needed.   Medication Adjustments/Labs and Tests Ordered: Current medicines are reviewed at length with the patient today.  Concerns regarding medicines are outline she d above.  Orders Placed This Encounter  Procedures   EKG 12-Lead   No orders of the defined types were placed in this encounter.   Patient Instructions  Medication Instructions:  Your physician recommends that you continue on your current medications as directed. Please refer to the Current Medication list given to you today.  *If you need a refill on your cardiac medications before your next appointment, please call your pharmacy*   Lab Work: None   Testing/Procedures: None   Follow-Up: At [facility], you and your health needs are our priority.  As part of our continuing mission to provide you with exceptional heart care, we have created designated Provider Care Teams.  These Care Teams include your primary Cardiologist (physician) and Advanced Practice Providers (APPs -  Physician Assistants and Nurse Practitioners) who all work together to provide you with the care you need, when you need it.   Your next appointment:   9 month(s)  Provider:   Berniece Salines, DO      Adopting a Healthy Lifestyle.  Know what a healthy weight is for you (roughly BMI <25) and aim to maintain this   Aim for 7+ servings of fruits and vegetables daily   65-80+ fluid ounces of water or unsweet tea for healthy kidneys   Limit to max 1 drink  of alcohol per day; avoid smoking/tobacco   Limit animal fats in diet for cholesterol and heart health - choose grass fed whenever available   Avoid highly processed foods, and foods high in saturated/trans fats   Aim for low stress - take time to unwind and care for your mental health   Aim for 150 min of moderate intensity exercise weekly for heart health, and weights twice weekly for bone health   Aim for 7-9 hours of sleep daily   When it comes to diets, agreement about the perfect plan isnt easy to find, even among the experts. Experts at the [employer] developed an idea known as the Healthy Eating Plate. Just imagine a plate divided into logical, healthy portions.   The emphasis is on diet quality:   Load up on vegetables and fruits - one-half of your plate: Aim for color and variety, and remember that potatoes dont count.   Go for whole grains - one-quarter of your plate: Whole wheat, barley, wheat berries, quinoa, oats, brown rice, and foods made  with them. If you want pasta, go with whole wheat pasta.   Protein power - one-quarter of your plate: Fish, chicken, beans, and nuts are all healthy, versatile protein sources. Limit red meat.   The diet, however, does go beyond the plate, offering a few other suggestions.   Use healthy plant oils, such as olive, canola, soy, corn, sunflower and peanut. Check the labels, and avoid partially hydrogenated oil, which have unhealthy trans fats.   If youre thirsty, drink water. Coffee and tea are good in moderation, but skip sugary drinks and limit milk and dairy products to one or two daily servings.   The type of carbohydrate in the diet is more important than the amount. Some sources of carbohydrates, such as vegetables, fruits, whole grains, and beans-are healthier than others.   Finally, stay active  Signed, Berniece Salines, DO  11/13/2022 10:27 AM    [place]

## 2022-11-14 ENCOUNTER — Other Ambulatory Visit (HOSPITAL_COMMUNITY): Payer: Self-pay | Admitting: Internal Medicine

## 2022-12-03 ENCOUNTER — Encounter: Payer: Self-pay | Admitting: Internal Medicine

## 2022-12-05 ENCOUNTER — Ambulatory Visit: Payer: Medicare PPO | Admitting: Internal Medicine

## 2022-12-12 DIAGNOSIS — J438 Other emphysema: Secondary | ICD-10-CM | POA: Diagnosis not present

## 2022-12-12 DIAGNOSIS — J9610 Chronic respiratory failure, unspecified whether with hypoxia or hypercapnia: Secondary | ICD-10-CM | POA: Diagnosis not present

## 2022-12-12 DIAGNOSIS — J449 Chronic obstructive pulmonary disease, unspecified: Secondary | ICD-10-CM | POA: Diagnosis not present

## 2022-12-17 ENCOUNTER — Ambulatory Visit: Payer: Medicare PPO | Admitting: Internal Medicine

## 2022-12-17 ENCOUNTER — Encounter: Payer: Self-pay | Admitting: Internal Medicine

## 2022-12-17 ENCOUNTER — Ambulatory Visit (HOSPITAL_BASED_OUTPATIENT_CLINIC_OR_DEPARTMENT_OTHER)
Admission: RE | Admit: 2022-12-17 | Discharge: 2022-12-17 | Disposition: A | Discharge status: Home / Self Care | Payer: Medicare PPO | Source: Ambulatory Visit | Attending: Internal Medicine | Admitting: Internal Medicine

## 2022-12-17 VITALS — BP 124/74 | HR 70 | Temp 98.0°F | Resp 18 | Ht 76.0 in | Wt 172.0 lb

## 2022-12-17 DIAGNOSIS — I2510 Atherosclerotic heart disease of native coronary artery without angina pectoris: Secondary | ICD-10-CM

## 2022-12-17 DIAGNOSIS — R0609 Other forms of dyspnea: Secondary | ICD-10-CM | POA: Insufficient documentation

## 2022-12-17 DIAGNOSIS — J449 Chronic obstructive pulmonary disease, unspecified: Secondary | ICD-10-CM | POA: Diagnosis not present

## 2022-12-17 DIAGNOSIS — Z0389 Encounter for observation for other suspected diseases and conditions ruled out: Secondary | ICD-10-CM | POA: Diagnosis not present

## 2022-12-17 DIAGNOSIS — J9610 Chronic respiratory failure, unspecified whether with hypoxia or hypercapnia: Secondary | ICD-10-CM

## 2022-12-17 MED ORDER — ANORO ELLIPTA 62.5-25 MCG/ACT IN AEPB: 1.0000 | INHALATION_SPRAY | Freq: Every day | RESPIRATORY_TRACT | 3 refills | Status: DC

## 2022-12-17 NOTE — Patient Instructions (Addendum)
We are getting oxygen to be used during the daytime.  Continue with nighttime oxygen  Try Anoro 1 puff every day in the morning  Referring you to the pulmonary office to get PFTs (you can call them at [phone])  GO TO THE LAB : Get the blood work     GO TO THE FRONT DESK, PLEASE SCHEDULE YOUR APPOINTMENTS Come back for   for a checkup  STOP BY THE FIRST FLOOR:  get the XR       Vaccines I recommend: Covid booster Tdap (tetanus) Shingrix (shingles) RSV vaccine

## 2022-12-17 NOTE — Progress Notes (Unsigned)
Subjective:    Patient ID: [name], male    DOB: 03/28/1951, 72 y.o.   MRN: [id number]  DOS:  12/17/2022 Type of visit - description: Follow-up  Here with his wife. Chronic medical problems were assessed. The patient is starting to mowing his yard a couple of weeks ago and noted that his stamina is not the same as last year.  Complaining of DOE after doing half of the ER. No chest pain, no palpitations. Exercise does not trigger cough or quizzing. No fever chills No edema   Review of Systems See above   Past Medical History:  Diagnosis Date   Abnormal myocardial perfusion study 2006   EF 44% ? inferoseptal ischemia. no cath done   Benign neoplasm of cecum    Breast mass, right 03/2008   Cardiomyopathy 07/20/2018   Ejection fraction 4045% in September 2019   Centrilobular emphysema 08/25/2018   02/04/2018-high-res CT- no evidence of interstitial lung disease, severe centrilobular emphysema and mild diffuse bronchial wall thickening suggesting COPD, no calcified pleural plaques no pleural effusion, mild smooth bilateral pleural thickening which is nonspecific  02/04/2018-pulmonary function test- FVC 3.37 (71% predicted), postbronchodilator ratio 50, postbronchodilator FEV1 54, mid flow reve   CHF (congestive heart failure)    COLONIC POLYPS, HX OF 03/18/2007   Qualifier: Diagnosis of  By: Nena Jordan    Contact dermatitis 01/31/2013   COPD (chronic obstructive pulmonary disease)    "dx'd 12/2017"   COPD GOLD II A  08/25/2018   02/04/2018-high-res CT- no evidence of interstitial lung disease, severe centrilobular emphysema and mild diffuse bronchial wall thickening suggesting COPD, no calcified pleural plaques no pleural effusion, mild smooth bilateral pleural thickening which is nonspecific  02/04/2018-pulmonary function test- FVC 3.37 (71% predicted), postbronchodilator ratio 50, postbronchodilator FEV1 54, mid flow reve   Coronary artery disease    Critical aortic  valve stenosis 01/12/2018   Calculated valve area less than 0.5 cm mean gradient more than 60 mm of mercury   DEGENERATIVE JOINT DISEASE, BACK 03/18/2007   Qualifier: Diagnosis of  By: Nena Jordan    Depressed left ventricular ejection fraction 03/28/2020   Diabetes mellitus 07/25/2020   Dyslipidemia 04/20/2020   Dyspnea on exertion 01/12/2018   ERECTILE DYSFUNCTION, ORGANIC 02/10/2009   Qualifier: Diagnosis of  By: Nena Jordan    Former cigarette smoker 10/12/2018   Quit in 2012 41-pack-year smoking history   Goals of care, counseling/discussion 08/24/2020   H/O aortic valve replacement 05/05/2020   Heart murmur    HEMORRHOIDS, INTERNAL 03/18/2007   Qualifier: Diagnosis of  By: Nena Jordan    HTN (hypertension) 10/21/2007        Hyperlipemia 10/21/2007   Qualifier: Diagnosis of  By: Nena Jordan    Hypertension    Hypoglycemia due to insulin 03/26/2020   Low back pain    MYOCARDIAL PERFUSION SCAN, WITH STRESS TEST, ABNORMAL 08/11/2008   Qualifier: Diagnosis of  By: Eden Emms, MD, Harrington Challenger    Nocturnal hypoxemia 08/25/2018   04/09/18 >>> ONO - telephone note>>> Patient's ONO on room air showed that he was less than 88% for more than an hour. Patient needs to be on 2 L at HS.  10/06/2018-overnight oximetry-entire duration was 4 hours and 23 minutes, SPO2 less than 88% for 25 minutes and 32 seconds     Oxygen deficiency    uses 2L at night   Paroxysmal atrial fibrillation 10/12/2018  PCP NOTES >>>>>>>>>>>>>>>>> 05/14/2020   Polycythemia vera 08/04/2020   S/P AVR 03/04/2018   AVR 23 mm Edwards Magna Valve - bioprostetic 03/04/2018 LIMA - LAD   Status post coronary artery bypass graft 03/20/2018   LIMA to LAD during aortic valve replacement surgery 03/04/2018   Type 2 diabetes mellitus with hyperglycemia, with long-term current use of insulin 04/20/2020   Type 2 diabetes mellitus, uncontrolled, with neuropathy 05/20/2008   Qualifier: Diagnosis of  By: Nena Jordan      Past Surgical History:  Procedure Laterality Date   ABDOMINAL AORTOGRAM N/A 01/13/2018   Procedure: ABDOMINAL AORTOGRAM;  Surgeon: Lennette Bihari, MD;  Location: [facility] INVASIVE CV LAB;  Service: Cardiovascular;  Laterality: N/A;   AORTIC VALVE REPLACEMENT N/A 03/04/2018   Procedure: AORTIC VALVE REPLACEMENT (AVR) 23mm Edwards Magna Ease Tissue Valve.;  Surgeon: Loreli Slot, MD;  Location: [facility] OR;  Service: Open Heart Surgery;  Laterality: N/A;   CARDIAC VALVE REPLACEMENT     COLONOSCOPY W/ POLYPECTOMY     COLONOSCOPY WITH PROPOFOL N/A 02/22/2020   Procedure: COLONOSCOPY WITH PROPOFOL;  Surgeon: Sherrilyn Rist, MD;  Location: [facility];  Service: Gastroenterology;  Laterality: N/A;   CORONARY ARTERY BYPASS GRAFT N/A 03/04/2018   Procedure: CORONARY ARTERY BYPASS GRAFTING (CABG) x1:  LIMA to LAD.;  Surgeon: Loreli Slot, MD;  Location: [facility] OR;  Service: Open Heart Surgery;  Laterality: N/A;   CORONARY PRESSURE/FFR STUDY N/A 05/10/2020   Procedure: INTRAVASCULAR PRESSURE WIRE/FFR STUDY;  Surgeon: Tonny Bollman, MD;  Location: [facility] INVASIVE CV LAB;  Service: Cardiovascular;  Laterality: N/A;   HEMOSTASIS CLIP PLACEMENT  02/22/2020   Procedure: HEMOSTASIS CLIP PLACEMENT;  Surgeon: Sherrilyn Rist, MD;  Location: [facility];  Service: Gastroenterology;;   LEFT HEART CATH AND CORS/GRAFTS ANGIOGRAPHY N/A 05/10/2020   Procedure: LEFT HEART CATH AND CORS/GRAFTS ANGIOGRAPHY;  Surgeon: Tonny Bollman, MD;  Location: [facility] INVASIVE CV LAB;  Service: Cardiovascular;  Laterality: N/A;   LIPOMA EXCISION Left 07/2008    "back of shoulder" Dr Harlon Flor   POLYPECTOMY  02/22/2020   Procedure: POLYPECTOMY;  Surgeon: Sherrilyn Rist, MD;  Location: [facility];  Service: Gastroenterology;;   RIGHT/LEFT HEART CATH AND CORONARY ANGIOGRAPHY N/A 01/13/2018   Procedure: RIGHT/LEFT HEART CATH AND CORONARY ANGIOGRAPHY;  Surgeon: Lennette Bihari, MD;  Location: [facility] INVASIVE CV LAB;  Service:  Cardiovascular;  Laterality: N/A;   TEE WITHOUT CARDIOVERSION N/A 03/04/2018   Procedure: TRANSESOPHAGEAL ECHOCARDIOGRAM (TEE);  Surgeon: Loreli Slot, MD;  Location: [facility] OR;  Service: Open Heart Surgery;  Laterality: N/A;   TONSILLECTOMY     WISDOM TOOTH EXTRACTION      Current Outpatient Medications  Medication Instructions   acetaminophen (TYLENOL) 1,000 mg, Oral, Every 6 hours PRN   amoxicillin (AMOXIL) 500 MG capsule TAKE 4 CAPSULES 1 HOUR BEFORE DENTAL PROCEDURE.   aspirin EC 325 mg, Oral, Daily   cetirizine (ZYRTEC) 10 mg, Oral, Daily PRN   Cyanocobalamin (VITAMIN B-12 PO) 1 tablet, Oral, Daily   glucose blood (PRODIGY NO CODING BLOOD GLUC) test strip Use as instructed to check blood sugar twice a day.  DX  E11.40    hydrALAZINE (APRESOLINE) 25 mg, Oral, 3 times daily   insulin aspart (NOVOLOG FLEXPEN) 100 UNIT/ML FlexPen Max daily 30 units   Insulin Pen Needle (BD PEN NEEDLE NANO 2ND GEN) 32G X 4 MM MISC 1 Device, Subcutaneous, 4 times daily   isosorbide mononitrate (IMDUR) 15 mg, Oral, Daily  Lantus SoloStar 18 Units, Subcutaneous, Daily   metFORMIN (GLUCOPHAGE) 1,000 mg, Oral, 2 times daily with meals   metoprolol succinate (TOPROL-XL) 50 mg, Oral, Daily, Please call for office visit [phone]   rosuvastatin (CRESTOR) 20 MG tablet TAKE 1 TABLET BY MOUTH EVERY DAY   sacubitril-valsartan (ENTRESTO) 97-103 MG 1 tablet, Oral, 2 times daily   spironolactone (ALDACTONE) 25 MG tablet TAKE 1/2 TABLET BY MOUTH EVERY DAY       Objective:   Physical Exam BP 124/74   Pulse 70   Temp 98 F (36.7 C) (Oral)   Resp 18   Ht  (1.93 m)   Wt 172 lb (78 kg)   SpO2 92%   BMI 20.94 kg/m  General:   Well developed, NAD, BMI noted.  HEENT:  Normocephalic . Face symmetric, atraumatic Lungs:  Decreased breath sounds Normal respiratory effort, no intercostal retractions, no accessory muscle use. Heart: RRR,  no murmur.  Abdomen:  Not distended, soft, non-tender. No  rebound or rigidity.   Skin: Not pale. Not jaundice Lower extremities: no pretibial edema bilaterally  Neurologic:  alert & oriented X3.  Speech normal, gait appropriate for age and unassisted Psych--  Cognition and judgment appear intact.  Cooperative with normal attention span and concentration.  Behavior appropriate. No anxious or depressed appearing.     Assessment     Assessment (new patient 03/2020) DM HTN CAD, CABG and aortic valve replacement due to stenosis on 02/2018 Cardiomyopathy, cath 05/10/2020, Rx medical treatment COPD 02/04/2018-high-res CT- no evidence of interstitial lung disease, severe centrilobular emphysema  02/04/2018-pulmonary function test- FVC 3.37 (71% predicted),  mid flow reversibility after bronchodilator  Polycythemia vera, + JAK2 Dx [id number].  PLAN:  DOE:  patient noted DOE for the last 2 weeks when hedid yard work as described above in the context of CAD, COPD with nocturnal hypoxemia.  Last echo few months ago with EF of 50 to 55% EKG today: No ischemic changes. O2 sat went from 96-87 with exertion. Suspect etiology of DOE is pulmonary rather than cardiac  Plan: - chest x-ray, PFTs, parking permit signed, start daytime - Trial with Anoro qd - Reassess in 6 weeks, consider cardiac eval. COPD: See above CAD: See above HTN: Seems controlled DM: Per Endo Aortic atherosclerosis: The patient and the wife are really concerned about this finding on CT chest, I explained that that this is essentially the same process that bleedthrough CAD and the treatment is what he is doing (ASA, DM-HTN-cholesterol control). RTC 6 weeks  Here for CPX DM Saw Endo last month, A1c was 7.5.  They note that he stopped [place] due to genital irritation, declining CGM technology ,declines  GLP-1's, comfortable using insulin. CAD, aortic valve replacement, cardiomyopathy Saw cardiology 05/15/2022, echo done.  EF 50 to 55%. Polycythemia vera, saw hematology 2023 Former  smoker:  quit in 2000, does not qualify for lung cancer screening.   COPD: last seen by pulm 2020, was rx O2 qhs, has occasional cough/wheezing. Minimal sxs , no need to repeat PFTs  RTC 6 months

## 2022-12-17 NOTE — Progress Notes (Unsigned)
SATURATION QUALIFICATIONS: (This note is used to comply with regulatory documentation for home oxygen)  Patient Saturations on Room Air at Rest = 97%  Patient Saturations on Room Air while Ambulating = 86%  Patient Saturations on 2 Liters of oxygen while Ambulating = 90%  Please briefly explain why patient needs home oxygen: 

## 2022-12-18 DIAGNOSIS — J9611 Chronic respiratory failure with hypoxia: Secondary | ICD-10-CM | POA: Diagnosis not present

## 2022-12-18 LAB — CBC WITH DIFFERENTIAL/PLATELET
Basophils Absolute: 0.1 K/uL (ref 0.0–0.1)
Basophils Relative: 1.2 % (ref 0.0–3.0)
Eosinophils Absolute: 0.2 K/uL (ref 0.0–0.7)
Eosinophils Relative: 2 % (ref 0.0–5.0)
HCT: 40.1 % (ref 39.0–52.0)
Hemoglobin: 12.2 g/dL — ABNORMAL LOW (ref 13.0–17.0)
Lymphocytes Relative: 21 % (ref 12.0–46.0)
Lymphs Abs: 1.8 K/uL (ref 0.7–4.0)
MCHC: 30.4 g/dL (ref 30.0–36.0)
MCV: 60.4 fl — ABNORMAL LOW (ref 78.0–100.0)
Monocytes Absolute: 0.8 K/uL (ref 0.1–1.0)
Monocytes Relative: 9.7 % (ref 3.0–12.0)
Neutro Abs: 5.5 K/uL (ref 1.4–7.7)
Neutrophils Relative %: 66.1 % (ref 43.0–77.0)
Platelets: 421 K/uL — ABNORMAL HIGH (ref 150.0–400.0)
RBC: 6.64 Mil/uL — ABNORMAL HIGH (ref 4.22–5.81)
RDW: 21.7 % — ABNORMAL HIGH (ref 11.5–15.5)
WBC: 8.4 K/uL (ref 4.0–10.5)

## 2022-12-18 NOTE — Assessment & Plan Note (Signed)
DOE:  patient noted DOE for the last 2 weeks when he started to do his spring  yard work as described above. Has a h/o CAD, COPD with nocturnal hypoxemia.  Last echo few months ago with EF of 50 to 55% EKG today: No ischemic changes. O2 sat went from 96%, dropped to  87% with exertion. Suspect etiology of DOE is pulmonary rather than cardiac  Plan: - chest x-ray, PFTs, parking permit signed, start daytime O2 - Trial with Anoro qd - Reassess in 6 weeks, consider cardiac eval. COPD: See above CAD: See above HTN: Seems controlled DM: Per Endo Aortic atherosclerosis: The patient and the wife are really concerned about this finding on CT chest, I explained that that this is essentially the same process that  caused  CAD and the treatment is what he is doing (ASA, DM-HTN-cholesterol control). RTC 6 weeks

## 2022-12-30 ENCOUNTER — Encounter: Payer: Self-pay | Admitting: Gastroenterology

## 2023-01-05 ENCOUNTER — Other Ambulatory Visit: Payer: Self-pay | Admitting: Cardiology

## 2023-01-17 DIAGNOSIS — J9611 Chronic respiratory failure with hypoxia: Secondary | ICD-10-CM | POA: Diagnosis not present

## 2023-01-28 ENCOUNTER — Encounter: Payer: Self-pay | Admitting: Internal Medicine

## 2023-01-28 ENCOUNTER — Ambulatory Visit: Payer: Medicare PPO | Admitting: Internal Medicine

## 2023-01-28 VITALS — BP 138/86 | HR 56 | Temp 98.1°F | Resp 18 | Ht 76.0 in | Wt 169.5 lb

## 2023-01-28 DIAGNOSIS — J449 Chronic obstructive pulmonary disease, unspecified: Secondary | ICD-10-CM | POA: Diagnosis not present

## 2023-01-28 MED ORDER — TRELEGY ELLIPTA 100-62.5-25 MCG/ACT IN AEPB: 1.0000 | INHALATION_SPRAY | Freq: Every day | RESPIRATORY_TRACT | 5 refills | Status: DC

## 2023-01-28 NOTE — Progress Notes (Unsigned)
SATURATION QUALIFICATIONS: (This note is used to comply with regulatory documentation for home oxygen)  Patient Saturations on Room Air at Rest = 93 %  Patient Saturations on Room Air while Ambulating = 83%  Patient Saturations on  Liters of oxygen while Ambulating = %  Please briefly explain why patient needs home oxygen:

## 2023-01-28 NOTE — Progress Notes (Unsigned)
Subjective:    Patient ID: [name], male    DOB: 05-25-1951, 72 y.o.   MRN: [id number]  DOS:  01/28/2023 Type of visit - description: f/u  Follow-up from previous visit. He was seen with DOE, oxygen was low, was Rx Anoro Oxygen supplementation. He is here today for a follow-up, he is not wearing oxygen, walking from his car to our office did not cause DOE. Denies cough, chest pain or leg swelling.  Review of Systems See above   Past Medical History:  Diagnosis Date   Abnormal myocardial perfusion study 2006   EF 44% ? inferoseptal ischemia. no cath done   Benign neoplasm of cecum    Breast mass, right 03/2008   Cardiomyopathy (HCC) 07/20/2018   Ejection fraction 4045% in September 2019   Centrilobular emphysema (HCC) 08/25/2018   02/04/2018-high-res CT- no evidence of interstitial lung disease, severe centrilobular emphysema and mild diffuse bronchial wall thickening suggesting COPD, no calcified pleural plaques no pleural effusion, mild smooth bilateral pleural thickening which is nonspecific  02/04/2018-pulmonary function test- FVC 3.37 (71% predicted), postbronchodilator ratio 50, postbronchodilator FEV1 54, mid flow reve   CHF (congestive heart failure) (HCC)    COLONIC POLYPS, HX OF 03/18/2007   Qualifier: Diagnosis of  By: Nena Jordan    Contact dermatitis 01/31/2013   COPD (chronic obstructive pulmonary disease) (HCC)    "dx'd 12/2017"   COPD GOLD II A  08/25/2018   02/04/2018-high-res CT- no evidence of interstitial lung disease, severe centrilobular emphysema and mild diffuse bronchial wall thickening suggesting COPD, no calcified pleural plaques no pleural effusion, mild smooth bilateral pleural thickening which is nonspecific  02/04/2018-pulmonary function test- FVC 3.37 (71% predicted), postbronchodilator ratio 50, postbronchodilator FEV1 54, mid flow reve   Coronary artery disease    Critical aortic valve stenosis 01/12/2018   Calculated valve area less than  0.5 cm mean gradient more than 60 mm of mercury   DEGENERATIVE JOINT DISEASE, BACK 03/18/2007   Qualifier: Diagnosis of  By: Nena Jordan    Depressed left ventricular ejection fraction 03/28/2020   Diabetes mellitus (HCC) 07/25/2020   Dyslipidemia 04/20/2020   Dyspnea on exertion 01/12/2018   ERECTILE DYSFUNCTION, ORGANIC 02/10/2009   Qualifier: Diagnosis of  By: Nena Jordan    Former cigarette smoker 10/12/2018   Quit in 2012 41-pack-year smoking history   Goals of care, counseling/discussion 08/24/2020   H/O aortic valve replacement 05/05/2020   Heart murmur    HEMORRHOIDS, INTERNAL 03/18/2007   Qualifier: Diagnosis of  By: Nena Jordan    HTN (hypertension) 10/21/2007        Hyperlipemia 10/21/2007   Qualifier: Diagnosis of  By: Nena Jordan    Hypertension    Hypoglycemia due to insulin 03/26/2020   Low back pain    MYOCARDIAL PERFUSION SCAN, WITH STRESS TEST, ABNORMAL 08/11/2008   Qualifier: Diagnosis of  By: Eden Emms, MD, Harrington Challenger    Nocturnal hypoxemia 08/25/2018   04/09/18 >>> ONO - telephone note>>> Patient's ONO on room air showed that he was less than 88% for more than an hour. Patient needs to be on 2 L at HS.  10/06/2018-overnight oximetry-entire duration was 4 hours and 23 minutes, SPO2 less than 88% for 25 minutes and 32 seconds     Oxygen deficiency    uses 2L at night   Paroxysmal atrial fibrillation (HCC) 10/12/2018   PCP NOTES >>>>>>>>>>>>>>>>> 05/14/2020   Polycythemia vera (HCC)  08/04/2020   S/P AVR 03/04/2018   AVR 23 mm Edwards Magna Valve - bioprostetic 03/04/2018 LIMA - LAD   Status post coronary artery bypass graft 03/20/2018   LIMA to LAD during aortic valve replacement surgery 03/04/2018   Type 2 diabetes mellitus with hyperglycemia, with long-term current use of insulin (HCC) 04/20/2020   Type 2 diabetes mellitus, uncontrolled, with neuropathy 05/20/2008   Qualifier: Diagnosis of  By: Nena Jordan     Past Surgical History:   Procedure Laterality Date   ABDOMINAL AORTOGRAM N/A 01/13/2018   Procedure: ABDOMINAL AORTOGRAM;  Surgeon: Lennette Bihari, MD;  Location: [facility] INVASIVE CV LAB;  Service: Cardiovascular;  Laterality: N/A;   AORTIC VALVE REPLACEMENT N/A 03/04/2018   Procedure: AORTIC VALVE REPLACEMENT (AVR) 23mm Edwards Magna Ease Tissue Valve.;  Surgeon: Loreli Slot, MD;  Location: [facility] OR;  Service: Open Heart Surgery;  Laterality: N/A;   CARDIAC VALVE REPLACEMENT     COLONOSCOPY W/ POLYPECTOMY     COLONOSCOPY WITH PROPOFOL N/A 02/22/2020   Procedure: COLONOSCOPY WITH PROPOFOL;  Surgeon: Sherrilyn Rist, MD;  Location: [facility];  Service: Gastroenterology;  Laterality: N/A;   CORONARY ARTERY BYPASS GRAFT N/A 03/04/2018   Procedure: CORONARY ARTERY BYPASS GRAFTING (CABG) x1:  LIMA to LAD.;  Surgeon: Loreli Slot, MD;  Location: [facility] OR;  Service: Open Heart Surgery;  Laterality: N/A;   CORONARY PRESSURE/FFR STUDY N/A 05/10/2020   Procedure: INTRAVASCULAR PRESSURE WIRE/FFR STUDY;  Surgeon: Tonny Bollman, MD;  Location: [facility] INVASIVE CV LAB;  Service: Cardiovascular;  Laterality: N/A;   HEMOSTASIS CLIP PLACEMENT  02/22/2020   Procedure: HEMOSTASIS CLIP PLACEMENT;  Surgeon: Sherrilyn Rist, MD;  Location: [facility];  Service: Gastroenterology;;   LEFT HEART CATH AND CORS/GRAFTS ANGIOGRAPHY N/A 05/10/2020   Procedure: LEFT HEART CATH AND CORS/GRAFTS ANGIOGRAPHY;  Surgeon: Tonny Bollman, MD;  Location: [facility] INVASIVE CV LAB;  Service: Cardiovascular;  Laterality: N/A;   LIPOMA EXCISION Left 07/2008    "back of shoulder" Dr Harlon Flor   POLYPECTOMY  02/22/2020   Procedure: POLYPECTOMY;  Surgeon: Sherrilyn Rist, MD;  Location: [facility];  Service: Gastroenterology;;   RIGHT/LEFT HEART CATH AND CORONARY ANGIOGRAPHY N/A 01/13/2018   Procedure: RIGHT/LEFT HEART CATH AND CORONARY ANGIOGRAPHY;  Surgeon: Lennette Bihari, MD;  Location: [facility] INVASIVE CV LAB;  Service: Cardiovascular;  Laterality: N/A;    TEE WITHOUT CARDIOVERSION N/A 03/04/2018   Procedure: TRANSESOPHAGEAL ECHOCARDIOGRAM (TEE);  Surgeon: Loreli Slot, MD;  Location: [facility] OR;  Service: Open Heart Surgery;  Laterality: N/A;   TONSILLECTOMY     WISDOM TOOTH EXTRACTION      Current Outpatient Medications  Medication Instructions   acetaminophen (TYLENOL) 1,000 mg, Oral, Every 6 hours PRN   amoxicillin (AMOXIL) 500 MG capsule TAKE 4 CAPSULES 1 HOUR BEFORE DENTAL PROCEDURE.   aspirin EC 325 mg, Oral, Daily   cetirizine (ZYRTEC) 10 mg, Oral, Daily PRN   Cyanocobalamin (VITAMIN B-12 PO) 1 tablet, Oral, Daily   Fluticasone-Umeclidin-Vilant (TRELEGY ELLIPTA) 100-62.5-25 MCG/ACT AEPB 1 puff, Inhalation, Daily   glucose blood (PRODIGY NO CODING BLOOD GLUC) test strip Use as instructed to check blood sugar twice a day.  DX  E11.40    hydrALAZINE (APRESOLINE) 25 mg, Oral, 3 times daily   insulin aspart (NOVOLOG FLEXPEN) 100 UNIT/ML FlexPen Max daily 30 units   Insulin Pen Needle (BD PEN NEEDLE NANO 2ND GEN) 32G X 4 MM MISC 1 Device, Subcutaneous, 4 times daily   isosorbide mononitrate (  IMDUR) 15 mg, Oral, Daily   Lantus SoloStar 18 Units, Subcutaneous, Daily   metFORMIN (GLUCOPHAGE) 1,000 mg, Oral, 2 times daily with meals   metoprolol succinate (TOPROL-XL) 50 mg, Oral, Daily, Please call for office visit [phone]   OXYGEN 2 L/min, Inhalation, Continuous   rosuvastatin (CRESTOR) 20 MG tablet TAKE 1 TABLET BY MOUTH EVERY DAY   sacubitril-valsartan (ENTRESTO) 97-103 MG 1 tablet, Oral, 2 times daily   spironolactone (ALDACTONE) 25 MG tablet TAKE 1/2 TABLET BY MOUTH EVERY DAY       Objective:   Physical Exam BP 138/86   Pulse (!) 56   Temp 98.1 F (36.7 C) (Oral)   Resp 18   Ht 6\' 4"  (1.93 m)   Wt 169 lb 8 oz (76.9 kg)   SpO2 97%   BMI 20.63 kg/m  General:   Well developed, NAD, BMI noted. HEENT:  Normocephalic . Face symmetric, atraumatic Lungs:  Decreased breath sounds Normal respiratory effort, no  intercostal retractions, no accessory muscle use. Heart: RRR,  no murmur.  Lower extremities: no pretibial edema bilaterally  Skin: Not pale. Not jaundice Neurologic:  alert & oriented X3.  Speech normal, gait appropriate for age and unassisted Psych--  Cognition and judgment appear intact.  Cooperative with normal attention span and concentration.  Behavior appropriate. No anxious or depressed appearing.        Assessment    Assessment (new patient 03/2020) DM HTN CAD, CABG and aortic valve replacement due to stenosis on 02/2018 Cardiomyopathy, cath 05/10/2020, Rx medical treatment COPD 02/04/2018-high-res CT- no evidence of interstitial lung disease, severe centrilobular emphysema  02/04/2018-pulmonary function test- FVC 3.37 (71% predicted),  mid flow reversibility after bronchodilator  Polycythemia vera, + JAK2 Dx [id number].  PLAN:  COPD, exertional hypoxia. See LOV, presented with DOE, symptoms are about the same.  He denies cough, exam showed decreased breath sounds. O2 sat after ambulation dropped from 93% to 83%. He now has a concentrator at home but no portable O2. Was not able to get Anoro Plan: Use oxygen at home, including at nighttime, trial with Trelegy, pulmonary referral. Vaccines I recommend: Tdap, RSV, COVID booster, PNM 20 RTC 4 months

## 2023-01-28 NOTE — Patient Instructions (Addendum)
We are referring you to the pulmonary doctors. You can reach them at [phone].  Instead of Anoro , try Trelegy 1 puff daily  Use your oxygen while you are at home, day and night  Vaccines I recommend: Covid booster Pneumonia shot (PNM 20) Shingrix (shingles) Tdap (tetanus) RSV vaccine    GO TO THE FRONT DESK, PLEASE SCHEDULE YOUR APPOINTMENTS Come back for a checkup in 4 months   Please bring [place] a copy of your Healthcare Power of Attorney for your chart.

## 2023-01-29 NOTE — Assessment & Plan Note (Signed)
COPD, exertional hypoxia. See LOV, presented with DOE, symptoms are about the same.  He denies cough, exam showed decreased breath sounds. O2 sat after ambulation dropped from 93% to 83%. He now has a concentrator at home but no portable O2. Was not able to get Anoro Plan: Use oxygen at home, including at nighttime, trial with Trelegy, pulmonary referral. Vaccines I recommend: Tdap, RSV, COVID booster, PNM 20 RTC 4 months

## 2023-02-14 ENCOUNTER — Other Ambulatory Visit (HOSPITAL_COMMUNITY): Payer: Self-pay | Admitting: Internal Medicine

## 2023-02-17 DIAGNOSIS — J9611 Chronic respiratory failure with hypoxia: Secondary | ICD-10-CM | POA: Diagnosis not present

## 2023-03-11 ENCOUNTER — Other Ambulatory Visit: Payer: Self-pay | Admitting: Cardiology

## 2023-03-12 ENCOUNTER — Ambulatory Visit: Payer: Medicare PPO | Admitting: Pulmonary Disease

## 2023-03-12 ENCOUNTER — Encounter: Payer: Self-pay | Admitting: Pulmonary Disease

## 2023-03-12 VITALS — BP 136/74 | HR 68 | Temp 98.0°F | Ht 76.0 in | Wt 170.6 lb

## 2023-03-12 DIAGNOSIS — J449 Chronic obstructive pulmonary disease, unspecified: Secondary | ICD-10-CM | POA: Diagnosis not present

## 2023-03-12 MED ORDER — STIOLTO RESPIMAT 2.5-2.5 MCG/ACT IN AERS: 2.0000 | INHALATION_SPRAY | Freq: Every day | RESPIRATORY_TRACT | 5 refills | Status: DC

## 2023-03-12 MED ORDER — STIOLTO RESPIMAT 2.5-2.5 MCG/ACT IN AERS: 2.0000 | INHALATION_SPRAY | Freq: Every day | RESPIRATORY_TRACT | Status: DC

## 2023-03-12 NOTE — Progress Notes (Signed)
[name]    [id number]    1950/10/11  Primary Care Physician:Paz, Nolon Rod, MD  Referring Physician: Wanda Plump, MD [address] [place],  [place] [address]  Chief complaint:  Consult for COPD  HPI: 72 y.o. who  has a past medical history of Abnormal myocardial perfusion study (2006), Benign neoplasm of cecum, Breast mass, right (03/2008), Cardiomyopathy (HCC) (07/20/2018), Centrilobular emphysema (HCC) (08/25/2018), CHF (congestive heart failure) (HCC), COLONIC POLYPS, HX OF (03/18/2007), Contact dermatitis (01/31/2013), COPD (chronic obstructive pulmonary disease) (HCC), COPD GOLD II A  (08/25/2018), Coronary artery disease, Critical aortic valve stenosis (01/12/2018), DEGENERATIVE JOINT DISEASE, BACK (03/18/2007), Depressed left ventricular ejection fraction (03/28/2020), Diabetes mellitus (HCC) (07/25/2020), Dyslipidemia (04/20/2020), Dyspnea on exertion (01/12/2018), ERECTILE DYSFUNCTION, ORGANIC (02/10/2009), Former cigarette smoker (10/12/2018), Goals of care, counseling/discussion (08/24/2020), H/O aortic valve replacement (05/05/2020), Heart murmur, HEMORRHOIDS, INTERNAL (03/18/2007), HTN (hypertension) (10/21/2007), Hyperlipemia (10/21/2007), Hypertension, Hypoglycemia due to insulin (03/26/2020), Low back pain, MYOCARDIAL PERFUSION SCAN, WITH STRESS TEST, ABNORMAL (08/11/2008), Nocturnal hypoxemia (08/25/2018), Oxygen deficiency, Paroxysmal atrial fibrillation (HCC) (10/12/2018), PCP NOTES >>>>>>>>>>>>>>>>> (05/14/2020), Polycythemia vera (HCC) (08/04/2020), S/P AVR (03/04/2018), Status post coronary artery bypass graft (03/20/2018), Type 2 diabetes mellitus with hyperglycemia, with long-term current use of insulin (HCC) (04/20/2020), and Type 2 diabetes mellitus, uncontrolled, with neuropathy (05/20/2008).    Pets: Occupation: Exposures: Smoking history: Travel history: Relevant family history:   Outpatient Encounter Medications as of 03/12/2023  Medication Sig    acetaminophen (TYLENOL) 500 MG tablet Take 1,000 mg by mouth every 6 (six) hours as needed for moderate pain or headache.   aspirin EC 325 MG EC tablet Take 1 tablet (325 mg total) by mouth daily.   cetirizine (ZYRTEC) 10 MG tablet Take 10 mg by mouth daily as needed for allergies.   Cyanocobalamin (VITAMIN B-12 PO) Take 1 tablet by mouth daily.   glucose blood (PRODIGY NO CODING BLOOD GLUC) test strip Use as instructed to check blood sugar twice a day.  DX  E11.40   hydrALAZINE (APRESOLINE) 25 MG tablet TAKE 1 TABLET BY MOUTH THREE TIMES A DAY   insulin aspart (NOVOLOG FLEXPEN) 100 UNIT/ML FlexPen Max daily 30 units   insulin glargine (LANTUS SOLOSTAR) 100 UNIT/ML Solostar Pen Inject 18 Units into the skin daily.   Insulin Pen Needle (BD PEN NEEDLE NANO 2ND GEN) 32G X 4 MM MISC Inject 1 Device into the skin in the morning, at noon, in the evening, and at bedtime.   metFORMIN (GLUCOPHAGE) 1000 MG tablet Take 1 tablet (1,000 mg total) by mouth 2 (two) times daily with a meal.   metoprolol succinate (TOPROL-XL) 50 MG 24 hr tablet TAKE 1 TABLET (50 MG TOTAL) BY MOUTH DAILY. PLEASE CALL FOR OFFICE VISIT [phone]   OXYGEN Inhale 2 L/min into the lungs continuous.   rosuvastatin (CRESTOR) 20 MG tablet TAKE 1 TABLET BY MOUTH EVERY DAY   sacubitril-valsartan (ENTRESTO) 97-103 MG TAKE 1 TABLET BY MOUTH TWICE A DAY   spironolactone (ALDACTONE) 25 MG tablet TAKE 1/2 TABLET BY MOUTH EVERY DAY   [DISCONTINUED] isosorbide mononitrate (IMDUR) 30 MG 24 hr tablet Take 15 mg by mouth daily.   [DISCONTINUED] amoxicillin (AMOXIL) 500 MG capsule TAKE 4 CAPSULES 1 HOUR BEFORE DENTAL PROCEDURE. (Patient not taking: Reported on 12/17/2022)   [DISCONTINUED] Fluticasone-Umeclidin-Vilant (TRELEGY ELLIPTA) 100-62.5-25 MCG/ACT AEPB Inhale 1 puff into the lungs daily. (Patient not taking: Reported on 03/12/2023)   No facility-administered encounter medications on file as of  03/12/2023.    Allergies as of 03/12/2023 -  Review Complete 03/12/2023  Allergen Reaction Noted   Farxiga [dapagliflozin] Rash and Other (See Comments) 12/26/2020   Lisinopril Other (See Comments) 06/11/2018   Amlodipine besylate Other (See Comments) 04/20/2008   Hydrochlorothiazide Hives 08/24/2020    Past Medical History:  Diagnosis Date   Abnormal myocardial perfusion study 2006   EF 44% ? inferoseptal ischemia. no cath done   Benign neoplasm of cecum    Breast mass, right 03/2008   Cardiomyopathy (HCC) 07/20/2018   Ejection fraction 4045% in September 2019   Centrilobular emphysema (HCC) 08/25/2018   02/04/2018-high-res CT- no evidence of interstitial lung disease, severe centrilobular emphysema and mild diffuse bronchial wall thickening suggesting COPD, no calcified pleural plaques no pleural effusion, mild smooth bilateral pleural thickening which is nonspecific  02/04/2018-pulmonary function test- FVC 3.37 (71% predicted), postbronchodilator ratio 50, postbronchodilator FEV1 54, mid flow reve   CHF (congestive heart failure) (HCC)    COLONIC POLYPS, HX OF 03/18/2007   Qualifier: Diagnosis of  By: Nena Jordan    Contact dermatitis 01/31/2013   COPD (chronic obstructive pulmonary disease) (HCC)    "dx'd 12/2017"   COPD GOLD II A  08/25/2018   02/04/2018-high-res CT- no evidence of interstitial lung disease, severe centrilobular emphysema and mild diffuse bronchial wall thickening suggesting COPD, no calcified pleural plaques no pleural effusion, mild smooth bilateral pleural thickening which is nonspecific  02/04/2018-pulmonary function test- FVC 3.37 (71% predicted), postbronchodilator ratio 50, postbronchodilator FEV1 54, mid flow reve   Coronary artery disease    Critical aortic valve stenosis 01/12/2018   Calculated valve area less than 0.5 cm mean gradient more than 60 mm of mercury   DEGENERATIVE JOINT DISEASE, BACK 03/18/2007   Qualifier: Diagnosis of  By: Nena Jordan    Depressed left ventricular ejection fraction  03/28/2020   Diabetes mellitus (HCC) 07/25/2020   Dyslipidemia 04/20/2020   Dyspnea on exertion 01/12/2018   ERECTILE DYSFUNCTION, ORGANIC 02/10/2009   Qualifier: Diagnosis of  By: Nena Jordan    Former cigarette smoker 10/12/2018   Quit in 2012 41-pack-year smoking history   Goals of care, counseling/discussion 08/24/2020   H/O aortic valve replacement 05/05/2020   Heart murmur    HEMORRHOIDS, INTERNAL 03/18/2007   Qualifier: Diagnosis of  By: Nena Jordan    HTN (hypertension) 10/21/2007        Hyperlipemia 10/21/2007   Qualifier: Diagnosis of  By: Nena Jordan    Hypertension    Hypoglycemia due to insulin 03/26/2020   Low back pain    MYOCARDIAL PERFUSION SCAN, WITH STRESS TEST, ABNORMAL 08/11/2008   Qualifier: Diagnosis of  By: Eden Emms, MD, Harrington Challenger    Nocturnal hypoxemia 08/25/2018   04/09/18 >>> ONO - telephone note>>> Patient's ONO on room air showed that he was less than 88% for more than an hour. Patient needs to be on 2 L at HS.  10/06/2018-overnight oximetry-entire duration was 4 hours and 23 minutes, SPO2 less than 88% for 25 minutes and 32 seconds     Oxygen deficiency    uses 2L at night   Paroxysmal atrial fibrillation (HCC) 10/12/2018   PCP NOTES >>>>>>>>>>>>>>>>> 05/14/2020   Polycythemia vera (HCC) 08/04/2020   S/P AVR 03/04/2018   AVR 23 mm Edwards Magna Valve - bioprostetic 03/04/2018 LIMA - LAD   Status post coronary artery bypass graft 03/20/2018   LIMA to LAD during aortic valve replacement  surgery 03/04/2018   Type 2 diabetes mellitus with hyperglycemia, with long-term current use of insulin (HCC) 04/20/2020   Type 2 diabetes mellitus, uncontrolled, with neuropathy 05/20/2008   Qualifier: Diagnosis of  By: Nena Jordan     Past Surgical History:  Procedure Laterality Date   ABDOMINAL AORTOGRAM N/A 01/13/2018   Procedure: ABDOMINAL AORTOGRAM;  Surgeon: Lennette Bihari, MD;  Location: [facility] INVASIVE CV LAB;  Service: Cardiovascular;  Laterality:  N/A;   AORTIC VALVE REPLACEMENT N/A 03/04/2018   Procedure: AORTIC VALVE REPLACEMENT (AVR) 23mm Edwards Magna Ease Tissue Valve.;  Surgeon: Loreli Slot, MD;  Location: [facility] OR;  Service: Open Heart Surgery;  Laterality: N/A;   CARDIAC VALVE REPLACEMENT     COLONOSCOPY W/ POLYPECTOMY     COLONOSCOPY WITH PROPOFOL N/A 02/22/2020   Procedure: COLONOSCOPY WITH PROPOFOL;  Surgeon: Sherrilyn Rist, MD;  Location: [facility] ENDOSCOPY;  Service: Gastroenterology;  Laterality: N/A;   CORONARY ARTERY BYPASS GRAFT N/A 03/04/2018   Procedure: CORONARY ARTERY BYPASS GRAFTING (CABG) x1:  LIMA to LAD.;  Surgeon: Loreli Slot, MD;  Location: [facility] OR;  Service: Open Heart Surgery;  Laterality: N/A;   CORONARY PRESSURE/FFR STUDY N/A 05/10/2020   Procedure: INTRAVASCULAR PRESSURE WIRE/FFR STUDY;  Surgeon: Tonny Bollman, MD;  Location: [facility] INVASIVE CV LAB;  Service: Cardiovascular;  Laterality: N/A;   HEMOSTASIS CLIP PLACEMENT  02/22/2020   Procedure: HEMOSTASIS CLIP PLACEMENT;  Surgeon: Sherrilyn Rist, MD;  Location: [facility];  Service: Gastroenterology;;   LEFT HEART CATH AND CORS/GRAFTS ANGIOGRAPHY N/A 05/10/2020   Procedure: LEFT HEART CATH AND CORS/GRAFTS ANGIOGRAPHY;  Surgeon: Tonny Bollman, MD;  Location: [facility] INVASIVE CV LAB;  Service: Cardiovascular;  Laterality: N/A;   LIPOMA EXCISION Left 07/2008    "back of shoulder" Dr Harlon Flor   POLYPECTOMY  02/22/2020   Procedure: POLYPECTOMY;  Surgeon: Sherrilyn Rist, MD;  Location: [facility];  Service: Gastroenterology;;   RIGHT/LEFT HEART CATH AND CORONARY ANGIOGRAPHY N/A 01/13/2018   Procedure: RIGHT/LEFT HEART CATH AND CORONARY ANGIOGRAPHY;  Surgeon: Lennette Bihari, MD;  Location: [facility] INVASIVE CV LAB;  Service: Cardiovascular;  Laterality: N/A;   TEE WITHOUT CARDIOVERSION N/A 03/04/2018   Procedure: TRANSESOPHAGEAL ECHOCARDIOGRAM (TEE);  Surgeon: Loreli Slot, MD;  Location: [facility] OR;  Service: Open Heart Surgery;  Laterality: N/A;    TONSILLECTOMY     WISDOM TOOTH EXTRACTION      Family History  Problem Relation Age of Onset   Melanoma Father 40       deceased secondary to melanoma   Alzheimer's disease Father    Diabetes Father    Hypertension Mother        alive -19   Colon cancer Neg Hx    Esophageal cancer Neg Hx    Colon polyps Neg Hx    Stomach cancer Neg Hx     Social History   Socioeconomic History   Marital status: Married    Spouse name: Not on file   Number of children: 3   Years of education: Not on file   Highest education level: Some college, no degree  Occupational History   Not on file  Tobacco Use   Smoking status: Former    Current packs/day: 0.00    Average packs/day: 1.5 packs/day for 27.0 years (40.5 ttl pk-yrs)    Types: Cigarettes    Start date: 29    Quit date: 2000    Years since quitting: 24.5   Smokeless tobacco: Never  Tobacco comments:    Smoked heavy from 46 until 2000, does not qualify for screening  Vaping Use   Vaping status: Never Used  Substance and Sexual Activity   Alcohol use: Yes    Comment: occasionally   Drug use: Not Currently   Sexual activity: Not Currently  Other Topics Concern   Not on file  Social History Narrative   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   Married    Former Smoker -  33 pack year history   Alcohol use-no     Drug use-no             Social Determinants of Corporate investment banker Strain: Low Risk  (12/16/2022)   Overall Financial Resource Strain (CARDIA)    Difficulty of Paying Living Expenses: Not hard at all  Food Insecurity: No Food Insecurity (12/16/2022)   Hunger Vital Sign    Worried About Running Out of Food in the Last Year: Never true    Ran Out of Food in the Last Year: Never true  Transportation Needs: No Transportation Needs (12/16/2022)   PRAPARE - Administrator, Civil Service (Medical): No    Lack of Transportation (Non-Medical): No  Physical Activity: Unknown (12/16/2022)   Exercise  Vital Sign    Days of Exercise per Week: 0 days    Minutes of Exercise per Session: Not on file  Stress: No Stress Concern Present (12/16/2022)   [employer] of Occupational Health - Occupational Stress Questionnaire    Feeling of Stress : Only a little  Social Connections: Socially Integrated (12/16/2022)   Social Connection and Isolation Panel [NHANES]    Frequency of Communication with Friends and Family: More than three times a week    Frequency of Social Gatherings with Friends and Family: Three times a week    Attends Religious Services: More than 4 times per year    Active Member of Clubs or Organizations: Yes    Attends Banker Meetings: More than 4 times per year    Marital Status: Married  Catering manager Violence: Not At Risk (03/30/2021)   Humiliation, Afraid, Rape, and Kick questionnaire    Fear of Current or Ex-Partner: No    Emotionally Abused: No    Physically Abused: No    Sexually Abused: No    Review of systems: Review of Systems  Constitutional: Negative for fever and chills.  HENT: Negative.   Eyes: Negative for blurred vision.  Respiratory: as per HPI  Cardiovascular: Negative for chest pain and palpitations.  Gastrointestinal: Negative for vomiting, diarrhea, blood per rectum. Genitourinary: Negative for dysuria, urgency, frequency and hematuria.  Musculoskeletal: Negative for myalgias, back pain and joint pain.  Skin: Negative for itching and rash.  Neurological: Negative for dizziness, tremors, focal weakness, seizures and loss of consciousness.  Endo/Heme/Allergies: Negative for environmental allergies.  Psychiatric/Behavioral: Negative for depression, suicidal ideas and hallucinations.  All other systems reviewed and are negative.  Physical Exam: Blood pressure 136/74, pulse 68, temperature 98 F (36.7 C), temperature source Oral, height 6\' 4"  (1.93 m), weight 170 lb 9.6 oz (77.4 kg), SpO2 97%. Gen:      No acute distress HEENT:   EOMI, sclera anicteric Neck:     No masses; no thyromegaly Lungs:    Clear to auscultation bilaterally; normal respiratory effort CV:         Regular rate and rhythm; no murmurs Abd:      + bowel sounds; soft, non-tender; no palpable masses,  no distension Ext:    No edema; adequate peripheral perfusion Skin:      Warm and dry; no rash Neuro: alert and oriented x 3 Psych: normal mood and affect  Data Reviewed: Imaging:  PFTs:  Labs:  Assessment:    Plan/Recommendations: I am glad you are stable with your breathing Will give you samples of Stiolto inhaler.  If you like it then we can send in a prescription Continue annual CTs of the chest Follow-up in 1 year   Chilton Greathouse MD [facility] 03/12/2023, 10:46 AM  CC: Wanda Plump, MD

## 2023-03-12 NOTE — Patient Instructions (Signed)
I am glad you are stable with your breathing Will give you samples of Stiolto inhaler.  If you like it then we can send in a prescription Continue annual CTs of the chest Follow-up in 1 year

## 2023-03-19 DIAGNOSIS — J9611 Chronic respiratory failure with hypoxia: Secondary | ICD-10-CM | POA: Diagnosis not present

## 2023-03-26 ENCOUNTER — Encounter (INDEPENDENT_AMBULATORY_CARE_PROVIDER_SITE_OTHER): Payer: Self-pay

## 2023-03-30 ENCOUNTER — Other Ambulatory Visit: Payer: Self-pay | Admitting: Internal Medicine

## 2023-03-30 ENCOUNTER — Other Ambulatory Visit: Payer: Self-pay | Admitting: Cardiology

## 2023-03-31 ENCOUNTER — Inpatient Hospital Stay: Payer: Medicare PPO

## 2023-03-31 ENCOUNTER — Inpatient Hospital Stay: Payer: Medicare PPO | Admitting: Family

## 2023-03-31 ENCOUNTER — Encounter: Payer: Self-pay | Admitting: Family

## 2023-03-31 ENCOUNTER — Inpatient Hospital Stay: Payer: Medicare PPO | Attending: Hematology & Oncology

## 2023-03-31 VITALS — BP 120/69 | HR 77 | Temp 98.2°F | Resp 17 | Wt 172.4 lb

## 2023-03-31 DIAGNOSIS — D45 Polycythemia vera: Secondary | ICD-10-CM

## 2023-03-31 DIAGNOSIS — D500 Iron deficiency anemia secondary to blood loss (chronic): Secondary | ICD-10-CM | POA: Diagnosis not present

## 2023-03-31 LAB — CBC WITH DIFFERENTIAL (CANCER CENTER ONLY)
Abs Immature Granulocytes: 0.05 10*3/uL (ref 0.00–0.07)
Basophils Absolute: 0 10*3/uL (ref 0.0–0.1)
Basophils Relative: 1 %
Eosinophils Absolute: 0.1 10*3/uL (ref 0.0–0.5)
Eosinophils Relative: 2 %
HCT: 42.4 % (ref 39.0–52.0)
Hemoglobin: 12.3 g/dL — ABNORMAL LOW (ref 13.0–17.0)
Immature Granulocytes: 1 %
Lymphocytes Relative: 23 %
Lymphs Abs: 1.6 10*3/uL (ref 0.7–4.0)
MCH: 17.7 pg — ABNORMAL LOW (ref 26.0–34.0)
MCHC: 29 g/dL — ABNORMAL LOW (ref 30.0–36.0)
MCV: 61.1 fL — ABNORMAL LOW (ref 80.0–100.0)
Monocytes Absolute: 0.9 10*3/uL (ref 0.1–1.0)
Monocytes Relative: 12 %
Neutro Abs: 4.3 10*3/uL (ref 1.7–7.7)
Neutrophils Relative %: 61 %
Platelet Count: 378 10*3/uL (ref 150–400)
RBC: 6.94 MIL/uL — ABNORMAL HIGH (ref 4.22–5.81)
RDW: 24.9 % — ABNORMAL HIGH (ref 11.5–15.5)
WBC Count: 7 10*3/uL (ref 4.0–10.5)
nRBC: 0 % (ref 0.0–0.2)

## 2023-03-31 LAB — IRON AND IRON BINDING CAPACITY (CC-WL,HP ONLY)
Iron: 29 ug/dL — ABNORMAL LOW (ref 45–182)
Saturation Ratios: 7 % — ABNORMAL LOW (ref 17.9–39.5)
TIBC: 423 ug/dL (ref 250–450)
UIBC: 394 ug/dL — ABNORMAL HIGH (ref 117–376)

## 2023-03-31 LAB — CMP (CANCER CENTER ONLY)
ALT: 6 U/L (ref 0–44)
AST: 13 U/L — ABNORMAL LOW (ref 15–41)
Albumin: 4.2 g/dL (ref 3.5–5.0)
Alkaline Phosphatase: 41 U/L (ref 38–126)
Anion gap: 7 (ref 5–15)
BUN: 10 mg/dL (ref 8–23)
CO2: 28 mmol/L (ref 22–32)
Calcium: 9 mg/dL (ref 8.9–10.3)
Chloride: 104 mmol/L (ref 98–111)
Creatinine: 0.95 mg/dL (ref 0.61–1.24)
GFR, Estimated: >60 mL/min (ref >60)
Glucose, Bld: 145 mg/dL — ABNORMAL HIGH (ref 70–99)
Potassium: 4.3 mmol/L (ref 3.5–5.1)
Sodium: 139 mmol/L (ref 135–145)
Total Bilirubin: 0.5 mg/dL (ref 0.3–1.2)
Total Protein: 7.5 g/dL (ref 6.5–8.1)

## 2023-03-31 LAB — FERRITIN: Ferritin: 6 ng/mL — ABNORMAL LOW (ref 24–336)

## 2023-03-31 NOTE — Progress Notes (Signed)
Hematology and Oncology Follow Up Visit  [name] [id number] 12/26/1950 72 y.o. 03/31/2023   Principle Diagnosis:  Polycythemia vera- JAK2 (+)   Current Therapy:        Phlebotomy to maintain hematocrit less than 45% Aspirin 325 mg PO daily - on full dose for cardiac issues   Interim History:  [name] is here today for follow-up. He is doing quite well and has no complaints at this time.  No blood loss noted. No bruising or petechiae.  Hct is stable at 42.4%.  No fever, chills, n/v, cough, rash, dizziness, SOB, chest pain, palpitations, abdominal pain or changes in bowel or bladder habits.  No swelling, tenderness, numbness or tingling in his extremities.  No falls or syncope.  Appetite and hydration are good. Weight is stable at 172 lbs.   ECOG Performance Status: 0 - Asymptomatic  Medications:  Allergies as of 03/31/2023       Reactions   Farxiga [dapagliflozin] Rash, Other (See Comments)   "swollen penis"   Lisinopril Other (See Comments)   Hyperkalemia   Amlodipine Besylate Other (See Comments)   REACTION: ? caused left axillary nodules/chest flutter   Hydrochlorothiazide Hives        Medication List        Accurate as of March 31, 2023  1:41 PM. If you have any questions, ask your nurse or doctor.          acetaminophen 500 MG tablet Commonly known as: TYLENOL Take 1,000 mg by mouth every 6 (six) hours as needed for moderate pain or headache.   aspirin EC 325 MG tablet Take 1 tablet (325 mg total) by mouth daily.   BD Pen Needle Nano 2nd Gen 32G X 4 MM Misc Generic drug: Insulin Pen Needle Inject 1 Device into the skin in the morning, at noon, in the evening, and at bedtime.   cetirizine 10 MG tablet Commonly known as: ZYRTEC Take 10 mg by mouth daily as needed for allergies.   Entresto 97-103 MG Generic drug: sacubitril-valsartan TAKE 1 TABLET BY MOUTH TWICE A DAY   glucose blood test strip Commonly known as: Prodigy No Coding  Blood Gluc Use as instructed to check blood sugar twice a day.  DX  E11.40   hydrALAZINE 25 MG tablet Commonly known as: APRESOLINE TAKE 1 TABLET BY MOUTH THREE TIMES A DAY   Lantus SoloStar 100 UNIT/ML Solostar Pen Generic drug: insulin glargine Inject 18 Units into the skin daily.   metFORMIN 1000 MG tablet Commonly known as: GLUCOPHAGE Take 1 tablet (1,000 mg total) by mouth 2 (two) times daily with a meal.   metoprolol succinate 50 MG 24 hr tablet Commonly known as: TOPROL-XL TAKE 1 TABLET (50 MG TOTAL) BY MOUTH DAILY. PLEASE CALL FOR OFFICE VISIT [phone]   NovoLOG FlexPen 100 UNIT/ML FlexPen Generic drug: insulin aspart Max daily 30 units   OXYGEN Inhale 2 L/min into the lungs continuous.   rosuvastatin 20 MG tablet Commonly known as: CRESTOR TAKE 1 TABLET BY MOUTH EVERY DAY   spironolactone 25 MG tablet Commonly known as: ALDACTONE TAKE 1/2 TABLET BY MOUTH EVERY DAY   Stiolto Respimat 2.5-2.5 MCG/ACT Aers Generic drug: Tiotropium Bromide-Olodaterol Inhale 2 puffs into the lungs daily.   Stiolto Respimat 2.5-2.5 MCG/ACT Aers Generic drug: Tiotropium Bromide-Olodaterol Inhale 2 puffs into the lungs daily.   VITAMIN B-12 PO Take 1 tablet by mouth daily.        Allergies:  Allergies  Allergen Reactions  Farxiga [Dapagliflozin] Rash and Other (See Comments)    "swollen penis"   Lisinopril Other (See Comments)    Hyperkalemia    Amlodipine Besylate Other (See Comments)    REACTION: ? caused left axillary nodules/chest flutter   Hydrochlorothiazide Hives    Past Medical History, Surgical history, Social history, and Family History were reviewed and updated.  Review of Systems: All other 10 point review of systems is negative.   Physical Exam:  weight is 172 lb 6.4 oz (78.2 kg). His oral temperature is 98.2 F (36.8 C). His blood pressure is 120/69 and his pulse is 77. His respiration is 17 and oxygen saturation is 97%.   Wt Readings from  Last 3 Encounters:  03/31/23 172 lb 6.4 oz (78.2 kg)  03/12/23 170 lb 9.6 oz (77.4 kg)  01/28/23 169 lb 8 oz (76.9 kg)    Ocular: Sclerae unicteric, pupils equal, round and reactive to light Ear-nose-throat: Oropharynx clear, dentition fair Lymphatic: No cervical or supraclavicular adenopathy Lungs no rales or rhonchi, good excursion bilaterally Heart regular rate and rhythm, no murmur appreciated Abd soft, nontender, positive bowel sounds MSK no focal spinal tenderness, no joint edema Neuro: non-focal, well-oriented, appropriate affect Breasts: Deferred   Lab Results  Component Value Date   WBC 7.0 03/31/2023   HGB 12.3 (L) 03/31/2023   HCT 42.4 03/31/2023   MCV 61.1 (L) 03/31/2023   PLT 378 03/31/2023   Lab Results  Component Value Date   FERRITIN 6 (L) 09/30/2022   IRON 33 (L) 09/30/2022   TIBC 444 09/30/2022   UIBC 411 (H) 09/30/2022   IRONPCTSAT 7 (L) 09/30/2022   Lab Results  Component Value Date   RETICCTPCT 1.1 08/30/2021   RBC 6.94 (H) 03/31/2023   No results found for: "KPAFRELGTCHN", "LAMBDASER", "KAPLAMBRATIO" No results found for: "IGGSERUM", "IGA", "IGMSERUM" No results found for: "TOTALPROTELP", "ALBUMINELP", "A1GS", "A2GS", "BETS", "BETA2SER", "GAMS", "MSPIKE", "SPEI"   Chemistry      Component Value Date/Time   NA 139 03/31/2023 1302   NA 141 11/12/2021 0959   K 4.3 03/31/2023 1302   CL 104 03/31/2023 1302   CO2 28 03/31/2023 1302   BUN 10 03/31/2023 1302   BUN 12 11/12/2021 0959   CREATININE 0.95 03/31/2023 1302   CREATININE 0.84 11/30/2013 0856      Component Value Date/Time   CALCIUM 9.0 03/31/2023 1302   ALKPHOS 41 03/31/2023 1302   AST 13 (L) 03/31/2023 1302   ALT 6 03/31/2023 1302   BILITOT 0.5 03/31/2023 1302       Impression and Plan: [name] is a very pleasant 72 yo African American gentleman with polycythemia vera, JAK 2 positive.  No phlebotomy needed this visit, Hct 42.4%. Follow-up in 6 months.  Eileen Stanford,  NP 8/5/20241:41 PM

## 2023-04-04 ENCOUNTER — Other Ambulatory Visit: Payer: Self-pay

## 2023-04-04 ENCOUNTER — Telehealth: Payer: Self-pay | Admitting: Cardiology

## 2023-04-04 MED ORDER — AMOXICILLIN 500 MG PO CAPS: 2000.0000 mg | ORAL_CAPSULE | Freq: Once | ORAL | 3 refills | Status: DC

## 2023-04-04 MED ORDER — AMOXICILLIN 500 MG PO CAPS: ORAL_CAPSULE | ORAL | 3 refills | Status: DC

## 2023-04-04 NOTE — Telephone Encounter (Signed)
*  STAT* If patient is at the pharmacy, call can be transferred to refill team.   1. Which medications need to be refilled? (please list name of each medication and dose if known) new prescription for his Amoxicillin   2. Would you like to learn more about the convenience, safety, & potential cost savings by using the [facility]?     3. Are you open to using the [facility] (Type [facility].    4. Which pharmacy/location (including street and city if local pharmacy) is medication to be sent to? [facility] [address], [place]   5. Do they need a 30 day or 90 day supply? 4

## 2023-04-04 NOTE — Telephone Encounter (Signed)
Pt's medication was sent to pt's pharmacy as requested. Confirmation received.  °

## 2023-04-10 ENCOUNTER — Ambulatory Visit (INDEPENDENT_AMBULATORY_CARE_PROVIDER_SITE_OTHER): Payer: Medicare PPO | Admitting: *Deleted

## 2023-04-10 VITALS — BP 112/82 | HR 72 | Ht 76.0 in | Wt 172.5 lb

## 2023-04-10 DIAGNOSIS — Z0000 Encounter for general adult medical examination without abnormal findings: Secondary | ICD-10-CM

## 2023-04-10 NOTE — Progress Notes (Signed)
Subjective:   [name] is a 72 y.o. male who presents for Medicare Annual/Subsequent preventive examination.  Visit Complete: Virtual  I connected with  Chucky May on 04/10/23 by a audio enabled telemedicine application and verified that I am speaking with the correct person using two identifiers.  Patient Location: Home  Provider Location: Office/Clinic  I discussed the limitations of evaluation and management by telemedicine. The patient expressed understanding and agreed to proceed.  Patient Medicare AWV questionnaire was completed by the patient on 04/08/23; I have confirmed that all information answered by patient is correct and no changes since this date.  Review of Systems     Cardiac Risk Factors include: male gender;advanced age (>43men, >64 women);diabetes mellitus;dyslipidemia;hypertension     Objective:   Pt reported vitals. Today's Vitals   04/10/23 0825  BP: 112/82  Pulse: 72  Weight: 172 lb 8 oz (78.2 kg)  Height: 6\' 4"  (1.93 m)   Body mass index is 21 kg/m.     04/10/2023    8:28 AM 03/31/2023    1:29 PM 09/30/2022    1:29 PM 04/08/2022    8:25 AM 03/29/2022    1:23 PM 11/28/2021    9:28 AM 08/30/2021   12:34 PM  Advanced Directives  Does Patient Have a Medical Advance Directive? Yes Yes Yes No No No No  Type of Estate agent of [place];Living will Living will;Healthcare Power of Attorney Living will;Healthcare Power of Attorney   Living will;Healthcare Power of Attorney   Does patient want to make changes to medical advance directive? No - Patient declined      No - Patient declined  Copy of Healthcare Power of Attorney in Chart? No - copy requested No - copy requested No - copy requested   No - copy requested   Would patient like information on creating a medical advance directive?  No - Patient declined No - Patient declined No - Patient declined No - Patient declined No - Patient declined     Current Medications  (verified) Outpatient Encounter Medications as of 04/10/2023  Medication Sig   acetaminophen (TYLENOL) 500 MG tablet Take 1,000 mg by mouth every 6 (six) hours as needed for moderate pain or headache.   amoxicillin (AMOXIL) 500 MG capsule Take 4 tablets by mouth 1 hour before dental procedure.   aspirin EC 325 MG EC tablet Take 1 tablet (325 mg total) by mouth daily.   cetirizine (ZYRTEC) 10 MG tablet Take 10 mg by mouth daily as needed for allergies.   Cyanocobalamin (VITAMIN B-12 PO) Take 1 tablet by mouth daily.   glucose blood (PRODIGY NO CODING BLOOD GLUC) test strip Use as instructed to check blood sugar twice a day.  DX  E11.40   hydrALAZINE (APRESOLINE) 25 MG tablet TAKE 1 TABLET BY MOUTH THREE TIMES A DAY   insulin aspart (NOVOLOG FLEXPEN) 100 UNIT/ML FlexPen Max daily 30 units   insulin glargine (LANTUS SOLOSTAR) 100 UNIT/ML Solostar Pen Inject 18 Units into the skin daily.   Insulin Pen Needle (BD PEN NEEDLE NANO 2ND GEN) 32G X 4 MM MISC Inject 1 Device into the skin in the morning, at noon, in the evening, and at bedtime.   metFORMIN (GLUCOPHAGE) 1000 MG tablet Take 1 tablet (1,000 mg total) by mouth 2 (two) times daily with a meal.   metoprolol succinate (TOPROL-XL) 50 MG 24 hr tablet TAKE 1 TABLET (50 MG TOTAL) BY MOUTH DAILY. PLEASE CALL FOR OFFICE VISIT [phone]  OXYGEN Inhale 2 L/min into the lungs continuous.   rosuvastatin (CRESTOR) 20 MG tablet TAKE 1 TABLET BY MOUTH EVERY DAY   sacubitril-valsartan (ENTRESTO) 97-103 MG TAKE 1 TABLET BY MOUTH TWICE A DAY   spironolactone (ALDACTONE) 25 MG tablet TAKE 1/2 TABLET BY MOUTH EVERY DAY   Tiotropium Bromide-Olodaterol (STIOLTO RESPIMAT) 2.5-2.5 MCG/ACT AERS Inhale 2 puffs into the lungs daily. (Patient not taking: Reported on 04/10/2023)   Tiotropium Bromide-Olodaterol (STIOLTO RESPIMAT) 2.5-2.5 MCG/ACT AERS Inhale 2 puffs into the lungs daily. (Patient not taking: Reported on 04/10/2023)   No facility-administered encounter  medications on file as of 04/10/2023.    Allergies (verified) Farxiga [dapagliflozin], Lisinopril, Amlodipine besylate, and Hydrochlorothiazide   History: Past Medical History:  Diagnosis Date   Abnormal myocardial perfusion study 2006   EF 44% ? inferoseptal ischemia. no cath done   Allergy    Benign neoplasm of cecum    Breast mass, right 03/2008   Cardiomyopathy (HCC) 07/20/2018   Ejection fraction 4045% in September 2019   Centrilobular emphysema (HCC) 08/25/2018   02/04/2018-high-res CT- no evidence of interstitial lung disease, severe centrilobular emphysema and mild diffuse bronchial wall thickening suggesting COPD, no calcified pleural plaques no pleural effusion, mild smooth bilateral pleural thickening which is nonspecific  02/04/2018-pulmonary function test- FVC 3.37 (71% predicted), postbronchodilator ratio 50, postbronchodilator FEV1 54, mid flow reve   CHF (congestive heart failure) (HCC)    COLONIC POLYPS, HX OF 03/18/2007   Qualifier: Diagnosis of  By: Nena Jordan    Contact dermatitis 01/31/2013   COPD (chronic obstructive pulmonary disease) (HCC)    "dx'd 12/2017"   COPD GOLD II A  08/25/2018   02/04/2018-high-res CT- no evidence of interstitial lung disease, severe centrilobular emphysema and mild diffuse bronchial wall thickening suggesting COPD, no calcified pleural plaques no pleural effusion, mild smooth bilateral pleural thickening which is nonspecific  02/04/2018-pulmonary function test- FVC 3.37 (71% predicted), postbronchodilator ratio 50, postbronchodilator FEV1 54, mid flow reve   Coronary artery disease    Critical aortic valve stenosis 01/12/2018   Calculated valve area less than 0.5 cm mean gradient more than 60 mm of mercury   DEGENERATIVE JOINT DISEASE, BACK 03/18/2007   Qualifier: Diagnosis of  By: Nena Jordan    Depressed left ventricular ejection fraction 03/28/2020   Diabetes mellitus (HCC) 07/25/2020   Dyslipidemia 04/20/2020   Dyspnea  on exertion 01/12/2018   Emphysema of lung (HCC)    ERECTILE DYSFUNCTION, ORGANIC 02/10/2009   Qualifier: Diagnosis of  By: Nena Jordan    Former cigarette smoker 10/12/2018   Quit in 2012 41-pack-year smoking history   Goals of care, counseling/discussion 08/24/2020   H/O aortic valve replacement 05/05/2020   Heart murmur    HEMORRHOIDS, INTERNAL 03/18/2007   Qualifier: Diagnosis of  By: Nena Jordan    HTN (hypertension) 10/21/2007        Hyperlipemia 10/21/2007   Qualifier: Diagnosis of  By: Nena Jordan    Hypertension    Hypoglycemia due to insulin 03/26/2020   Low back pain    MYOCARDIAL PERFUSION SCAN, WITH STRESS TEST, ABNORMAL 08/11/2008   Qualifier: Diagnosis of  By: Eden Emms, MD, Harrington Challenger    Nocturnal hypoxemia 08/25/2018   04/09/18 >>> ONO - telephone note>>> Patient's ONO on room air showed that he was less than 88% for more than an hour. Patient needs to be on 2 L at HS.  10/06/2018-overnight oximetry-entire duration was 4 hours  and 23 minutes, SPO2 less than 88% for 25 minutes and 32 seconds     Oxygen deficiency    uses 2L at night   Paroxysmal atrial fibrillation (HCC) 10/12/2018   PCP NOTES >>>>>>>>>>>>>>>>> 05/14/2020   Polycythemia vera (HCC) 08/04/2020   S/P AVR 03/04/2018   AVR 23 mm Edwards Magna Valve - bioprostetic 03/04/2018 LIMA - LAD   Status post coronary artery bypass graft 03/20/2018   LIMA to LAD during aortic valve replacement surgery 03/04/2018   Type 2 diabetes mellitus with hyperglycemia, with long-term current use of insulin (HCC) 04/20/2020   Type 2 diabetes mellitus, uncontrolled, with neuropathy 05/20/2008   Qualifier: Diagnosis of  By: Nena Jordan    Past Surgical History:  Procedure Laterality Date   ABDOMINAL AORTOGRAM N/A 01/13/2018   Procedure: ABDOMINAL AORTOGRAM;  Surgeon: Lennette Bihari, MD;  [facility]: [facility] INVASIVE CV LAB;  Service: Cardiovascular;  Laterality: N/A;   AORTIC VALVE REPLACEMENT N/A  03/04/2018   Procedure: AORTIC VALVE REPLACEMENT (AVR) 23mm Edwards Magna Ease Tissue Valve.;  Surgeon: Loreli Slot, MD;  Location: [facility] OR;  Service: Open Heart Surgery;  Laterality: N/A;   CARDIAC VALVE REPLACEMENT     COLONOSCOPY W/ POLYPECTOMY     COLONOSCOPY WITH PROPOFOL N/A 02/22/2020   Procedure: COLONOSCOPY WITH PROPOFOL;  Surgeon: Sherrilyn Rist, MD;  Location: [facility] ENDOSCOPY;  Service: Gastroenterology;  Laterality: N/A;   CORONARY ARTERY BYPASS GRAFT N/A 03/04/2018   Procedure: CORONARY ARTERY BYPASS GRAFTING (CABG) x1:  LIMA to LAD.;  Surgeon: Loreli Slot, MD;  Location: [facility] OR;  Service: Open Heart Surgery;  Laterality: N/A;   CORONARY PRESSURE/FFR STUDY N/A 05/10/2020   Procedure: INTRAVASCULAR PRESSURE WIRE/FFR STUDY;  Surgeon: Tonny Bollman, MD;  Location: [facility] INVASIVE CV LAB;  Service: Cardiovascular;  Laterality: N/A;   HEMOSTASIS CLIP PLACEMENT  02/22/2020   Procedure: HEMOSTASIS CLIP PLACEMENT;  Surgeon: Sherrilyn Rist, MD;  Location: [facility];  Service: Gastroenterology;;   LEFT HEART CATH AND CORS/GRAFTS ANGIOGRAPHY N/A 05/10/2020   Procedure: LEFT HEART CATH AND CORS/GRAFTS ANGIOGRAPHY;  Surgeon: Tonny Bollman, MD;  Location: [facility] INVASIVE CV LAB;  Service: Cardiovascular;  Laterality: N/A;   LIPOMA EXCISION Left 07/2008    "back of shoulder" Dr Harlon Flor   POLYPECTOMY  02/22/2020   Procedure: POLYPECTOMY;  Surgeon: Sherrilyn Rist, MD;  Location: [facility];  Service: Gastroenterology;;   RIGHT/LEFT HEART CATH AND CORONARY ANGIOGRAPHY N/A 01/13/2018   Procedure: RIGHT/LEFT HEART CATH AND CORONARY ANGIOGRAPHY;  Surgeon: Lennette Bihari, MD;  Location: [facility] INVASIVE CV LAB;  Service: Cardiovascular;  Laterality: N/A;   TEE WITHOUT CARDIOVERSION N/A 03/04/2018   Procedure: TRANSESOPHAGEAL ECHOCARDIOGRAM (TEE);  Surgeon: Loreli Slot, MD;  Location: [facility] OR;  Service: Open Heart Surgery;  Laterality: N/A;   TONSILLECTOMY     WISDOM TOOTH EXTRACTION      Family History  Problem Relation Age of Onset   Melanoma Father 26       deceased secondary to melanoma   Alzheimer's disease Father    Diabetes Father    Hypertension Mother        alive -49   Colon cancer Neg Hx    Esophageal cancer Neg Hx    Colon polyps Neg Hx    Stomach cancer Neg Hx    Social History   Socioeconomic History   Marital status: Married    Spouse name: Not on file   Number of children: 3  Years of education: Not on file   Highest education level: Some college, no degree  Occupational History   Not on file  Tobacco Use   Smoking status: Former    Current packs/day: 0.00    Average packs/day: 1.5 packs/day for 27.0 years (40.5 ttl pk-yrs)    Types: Cigarettes    Start date: 5    Quit date: 2000    Years since quitting: 24.6   Smokeless tobacco: Never   Tobacco comments:    Smoked heavy from 89 until 2000, does not qualify for screening  Vaping Use   Vaping status: Never Used  Substance and Sexual Activity   Alcohol use: Yes    Alcohol/week: 2.0 standard drinks of alcohol    Types: 2 Glasses of wine per week    Comment: occasionally   Drug use: Not Currently   Sexual activity: Not Currently  Other Topics Concern   Not on file  Social History Narrative   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   Married    Former Smoker -  33 pack year history   Alcohol use-no     Drug use-no             Social Determinants of Corporate investment banker Strain: Low Risk  (04/08/2023)   Overall Financial Resource Strain (CARDIA)    Difficulty of Paying Living Expenses: Not hard at all  Food Insecurity: No Food Insecurity (04/08/2023)   Hunger Vital Sign    Worried About Running Out of Food in the Last Year: Never true    Ran Out of Food in the Last Year: Never true  Transportation Needs: No Transportation Needs (04/08/2023)   PRAPARE - Administrator, Civil Service (Medical): No    Lack of Transportation (Non-Medical): No  Physical  Activity: Inactive (04/08/2023)   Exercise Vital Sign    Days of Exercise per Week: 2 days    Minutes of Exercise per Session: 0 min  Stress: No Stress Concern Present (04/08/2023)   [employer] of Occupational Health - Occupational Stress Questionnaire    Feeling of Stress : Only a little  Social Connections: Socially Integrated (04/08/2023)   Social Connection and Isolation Panel [NHANES]    Frequency of Communication with Friends and Family: Three times a week    Frequency of Social Gatherings with Friends and Family: Twice a week    Attends Religious Services: More than 4 times per year    Active Member of [employer] or Organizations: Yes    Attends Engineer, structural: More than 4 times per year    Marital Status: Married    Tobacco Counseling Counseling given: Not Answered Tobacco comments: Smoked heavy from 1973 until 2000, does not qualify for screening   Clinical Intake:  Pre-visit preparation completed: Yes  Pain : No/denies pain  BMI - recorded: 21 Nutritional Status: BMI of 19-24  Normal Nutritional Risks: None Diabetes: Yes CBG done?: No Did pt. bring in CBG monitor from home?: No  How often do you need to have someone help you when you read instructions, pamphlets, or other written materials from your doctor or pharmacy?: 2 - Rarely  Interpreter Needed?: No  Information entered by :: Donne Anon, CMA   Activities of Daily Living    04/08/2023    2:06 PM  In your present state of health, do you have any difficulty performing the following activities:  Hearing? 0  Vision? 0  Difficulty concentrating or making decisions? 0  Walking or climbing stairs? 0  Dressing or bathing? 0  Doing errands, shopping? 0  Preparing Food and eating ? N  Using the Toilet? N  In the past six months, have you accidently leaked urine? N  Do you have problems with loss of bowel control? N  Managing your Medications? N  Managing your Finances? N  Housekeeping or  managing your Housekeeping? N    Patient Care Team: Wanda Plump, MD as PCP - General (Internal Medicine) Thomasene Ripple, DO as PCP - Cardiology (Cardiology) Carlus Pavlov, MD as Consulting Physician (Internal Medicine) Charlott Holler, MD as Consulting Physician (Pulmonary Disease) Wanda Plump, MD as Consulting Physician (Internal Medicine)  Indicate any recent Medical Services you may have received from other than [facility] providers in the past year (date may be approximate).     Assessment:   This is a routine wellness examination for [place].  Hearing/Vision screen No results found.  Dietary issues and exercise activities discussed:     Goals Addressed   None    Depression Screen    04/10/2023    8:27 AM 01/28/2023   11:17 AM 12/17/2022    1:34 PM 06/05/2022    9:48 AM 04/08/2022    8:26 AM 12/03/2021   10:07 AM 06/04/2021    9:46 AM  PHQ 2/9 Scores  PHQ - 2 Score 1 0 0 0 0 0 0    Fall Risk    04/08/2023    2:06 PM 01/28/2023   11:17 AM 12/17/2022    1:34 PM 06/05/2022    9:48 AM 04/08/2022    8:26 AM  Fall Risk   Falls in the past year? 0 0 0 0 0  Number falls in past yr: 0 0 0 0 0  Injury with Fall? 0 0 0 0 0  Risk for fall due to : No Fall Risks    No Fall Risks  Follow up Falls evaluation completed Falls evaluation completed Falls evaluation completed Falls evaluation completed Falls evaluation completed    MEDICARE RISK AT HOME:   TIMED UP AND GO:  Was the test performed?  No    Cognitive Function:        04/10/2023    8:29 AM  6CIT Screen  What Year? 0 points  What month? 0 points  What time? 0 points  Count back from 20 0 points  Months in reverse 0 points  Repeat phrase 2 points  Total Score 2 points    Immunizations Immunization History  Administered Date(s) Administered   Fluad Quad(high Dose 65+) 05/26/2019, 05/12/2020, 06/04/2021, 06/05/2022   Influenza Split 08/04/2012   Influenza Whole 05/20/2008, 07/12/2009, 05/02/2010    Influenza, High Dose Seasonal PF 05/20/2017, 06/10/2018   Influenza,inj,Quad PF,6+ Mos 05/07/2013, 05/09/2014, 06/07/2015, 05/01/2016   PFIZER(Purple Top)SARS-COV-2 Vaccination 10/08/2019, 11/03/2019, 11/16/2019, 12/14/2019, 06/29/2020   Pneumococcal Conjugate-13 02/05/2017   Pneumococcal Polysaccharide-23 05/20/2008, 08/25/2018   Td 08/26/2008    TDAP status: Due, Education has been provided regarding the importance of this vaccine. Advised may receive this vaccine at local pharmacy or Health Dept. Aware to provide a copy of the vaccination record if obtained from local pharmacy or Health Dept. Verbalized acceptance and understanding.  Flu Vaccine status: Due, Education has been provided regarding the importance of this vaccine. Advised may receive this vaccine at local pharmacy or Health Dept. Aware to provide a copy of the vaccination record if obtained from local pharmacy or Health Dept. Verbalized acceptance and understanding.  Pneumococcal  vaccine status: Up to date  Covid-19 vaccine status: Information provided on how to obtain vaccines.   Qualifies for Shingles Vaccine? Yes   Zostavax completed No   Shingrix Completed?: No.    Education has been provided regarding the importance of this vaccine. Patient has been advised to call insurance company to determine out of pocket expense if they have not yet received this vaccine. Advised may also receive vaccine at local pharmacy or Health Dept. Verbalized acceptance and understanding.  Screening Tests Health Maintenance  Topic Date Due   Zoster Vaccines- Shingrix (1 of 2) Never done   DTaP/Tdap/Td (2 - Tdap) 08/26/2018   Diabetic kidney evaluation - Urine ACR  04/19/2021   COVID-19 Vaccine (6 - 2023-24 season) 04/26/2022   Colonoscopy  03/04/2023   Medicare Annual Wellness (AWV)  04/09/2023   INFLUENZA VACCINE  03/27/2023   FOOT EXAM  05/14/2023   HEMOGLOBIN A1C  05/15/2023   OPHTHALMOLOGY EXAM  09/19/2023   Diabetic kidney  evaluation - eGFR measurement  03/30/2024   Pneumonia Vaccine 63+ Years old  Completed   Hepatitis C Screening  Completed   HPV VACCINES  Aged Out    Health Maintenance  Health Maintenance Due  Topic Date Due   Zoster Vaccines- Shingrix (1 of 2) Never done   DTaP/Tdap/Td (2 - Tdap) 08/26/2018   Diabetic kidney evaluation - Urine ACR  04/19/2021   COVID-19 Vaccine (6 - 2023-24 season) 04/26/2022   Colonoscopy  03/04/2023   Medicare Annual Wellness (AWV)  04/09/2023   INFLUENZA VACCINE  03/27/2023    Colorectal cancer screening: Type of screening: Colonoscopy. Completed 03/03/20. Repeat every 3 years  Lung Cancer Screening: (Low Dose CT Chest recommended if Age 84-80 years, 20 pack-year currently smoking OR have quit w/in 15years.) does not qualify.   Additional Screening:  Hepatitis C Screening: does qualify; Completed 09/08/18  Vision Screening: Recommended annual ophthalmology exams for early detection of glaucoma and other disorders of the eye. Is the patient up to date with their annual eye exam?  Yes  Who is the provider or what is the name of the office in which the patient attends annual eye exams? [facility] If pt is not established with a provider, would they like to be referred to a provider to establish care? No .   Dental Screening: Recommended annual dental exams for proper oral hygiene  Diabetic Foot Exam: Diabetic Foot Exam: Completed 05/13/22  Community Resource Referral / Chronic Care Management: CRR required this visit?  No   CCM required this visit?  No     Plan:     I have personally reviewed and noted the following in the patient's chart:   Medical and social history Use of alcohol, tobacco or illicit drugs  Current medications and supplements including opioid prescriptions. Patient is not currently taking opioid prescriptions. Functional ability and status Nutritional status Physical activity Advanced directives List of other  physicians Hospitalizations, surgeries, and ER visits in previous 12 months Vitals Screenings to include cognitive, depression, and falls Referrals and appointments  In addition, I have reviewed and discussed with patient certain preventive protocols, quality metrics, and best practice recommendations. A written personalized care plan for preventive services as well as general preventive health recommendations were provided to patient.     Donne Anon, CMA   04/10/2023   After Visit Summary: (MyChart) Due to this being a telephonic visit, the after visit summary with patients personalized plan was offered to patient via MyChart  Nurse Notes: None

## 2023-04-10 NOTE — Patient Instructions (Signed)
[name] , Thank you for taking time to come for your Medicare Wellness Visit. I appreciate your ongoing commitment to your health goals. Please review the following plan we discussed and let me know if I can assist you in the future.     This is a list of the screening recommended for you and due dates:  Health Maintenance  Topic Date Due   Zoster (Shingles) Vaccine (1 of 2) Never done   DTaP/Tdap/Td vaccine (2 - Tdap) 08/26/2018   Yearly kidney health urinalysis for diabetes  04/19/2021   COVID-19 Vaccine (6 - 2023-24 season) 04/26/2022   Colon Cancer Screening  03/04/2023   Flu Shot  03/27/2023   Complete foot exam   05/14/2023   Hemoglobin A1C  05/15/2023   Eye exam for diabetics  09/19/2023   Yearly kidney function blood test for diabetes  03/30/2024   Medicare Annual Wellness Visit  04/09/2024   Pneumonia Vaccine  Completed   Hepatitis C Screening  Completed   HPV Vaccine  Aged Out    Next appointment: Follow up in one year for your annual wellness visit.   Preventive Care 9 Years and Older, Male Preventive care refers to lifestyle choices and visits with your health care provider that can promote health and wellness. What does preventive care include? A yearly physical exam. This is also called an annual well check. Dental exams once or twice a year. Routine eye exams. Ask your health care provider how often you should have your eyes checked. Personal lifestyle choices, including: Daily care of your teeth and gums. Regular physical activity. Eating a healthy diet. Avoiding tobacco and drug use. Limiting alcohol use. Practicing safe sex. Taking low doses of aspirin every day. Taking vitamin and mineral supplements as recommended by your health care provider. What happens during an annual well check? The services and screenings done by your health care provider during your annual well check will depend on your age, overall health, lifestyle risk factors, and  family history of disease. Counseling  Your health care provider may ask you questions about your: Alcohol use. Tobacco use. Drug use. Emotional well-being. Home and relationship well-being. Sexual activity. Eating habits. History of falls. Memory and ability to understand (cognition). Work and work Astronomer. Screening  You may have the following tests or measurements: Height, weight, and BMI. Blood pressure. Lipid and cholesterol levels. These may be checked every 5 years, or more frequently if you are over 61 years old. Skin check. Lung cancer screening. You may have this screening every year starting at age 89 if you have a 30-pack-year history of smoking and currently smoke or have quit within the past 15 years. Fecal occult blood test (FOBT) of the stool. You may have this test every year starting at age 61. Flexible sigmoidoscopy or colonoscopy. You may have a sigmoidoscopy every 5 years or a colonoscopy every 10 years starting at age 84. Prostate cancer screening. Recommendations will vary depending on your family history and other risks. Hepatitis C blood test. Hepatitis B blood test. Sexually transmitted disease (STD) testing. Diabetes screening. This is done by checking your blood sugar (glucose) after you have not eaten for a while (fasting). You may have this done every 1-3 years. Abdominal aortic aneurysm (AAA) screening. You may need this if you are a current or former smoker. Osteoporosis. You may be screened starting at age 92 if you are at high risk. Talk with your health care provider about your test results, treatment options,  and if necessary, the need for more tests. Vaccines  Your health care provider may recommend certain vaccines, such as: Influenza vaccine. This is recommended every year. Tetanus, diphtheria, and acellular pertussis (Tdap, Td) vaccine. You may need a Td booster every 10 years. Zoster vaccine. You may need this after age 46. Pneumococcal  13-valent conjugate (PCV13) vaccine. One dose is recommended after age 10. Pneumococcal polysaccharide (PPSV23) vaccine. One dose is recommended after age 30. Talk to your health care provider about which screenings and vaccines you need and how often you need them. This information is not intended to replace advice given to you by your health care provider. Make sure you discuss any questions you have with your health care provider. Document Released: 09/08/2015 Document Revised: 05/01/2016 Document Reviewed: 06/13/2015 Elsevier Interactive Patient Education  2017 [employer].  Fall Prevention in the Home Falls can cause injuries. They can happen to people of all ages. There are many things you can do to make your home safe and to help prevent falls. What can I do on the outside of my home? Regularly fix the edges of walkways and driveways and fix any cracks. Remove anything that might make you trip as you walk through a door, such as a raised step or threshold. Trim any bushes or trees on the path to your home. Use bright outdoor lighting. Clear any walking paths of anything that might make someone trip, such as rocks or tools. Regularly check to see if handrails are loose or broken. Make sure that both sides of any steps have handrails. Any raised decks and porches should have guardrails on the edges. Have any leaves, snow, or ice cleared regularly. Use sand or salt on walking paths during winter. Clean up any spills in your garage right away. This includes oil or grease spills. What can I do in the bathroom? Use night lights. Install grab bars by the toilet and in the tub and shower. Do not use towel bars as grab bars. Use non-skid mats or decals in the tub or shower. If you need to sit down in the shower, use a plastic, non-slip stool. Keep the floor dry. Clean up any water that spills on the floor as soon as it happens. Remove soap buildup in the tub or shower regularly. Attach  bath mats securely with double-sided non-slip rug tape. Do not have throw rugs and other things on the floor that can make you trip. What can I do in the bedroom? Use night lights. Make sure that you have a light by your bed that is easy to reach. Do not use any sheets or blankets that are too big for your bed. They should not hang down onto the floor. Have a firm chair that has side arms. You can use this for support while you get dressed. Do not have throw rugs and other things on the floor that can make you trip. What can I do in the kitchen? Clean up any spills right away. Avoid walking on wet floors. Keep items that you use a lot in easy-to-reach places. If you need to reach something above you, use a strong step stool that has a grab bar. Keep electrical cords out of the way. Do not use floor polish or wax that makes floors slippery. If you must use wax, use non-skid floor wax. Do not have throw rugs and other things on the floor that can make you trip. What can I do with my stairs? Do not leave  any items on the stairs. Make sure that there are handrails on both sides of the stairs and use them. Fix handrails that are broken or loose. Make sure that handrails are as long as the stairways. Check any carpeting to make sure that it is firmly attached to the stairs. Fix any carpet that is loose or worn. Avoid having throw rugs at the top or bottom of the stairs. If you do have throw rugs, attach them to the floor with carpet tape. Make sure that you have a light switch at the top of the stairs and the bottom of the stairs. If you do not have them, ask someone to add them for you. What else can I do to help prevent falls? Wear shoes that: Do not have high heels. Have rubber bottoms. Are comfortable and fit you well. Are closed at the toe. Do not wear sandals. If you use a stepladder: Make sure that it is fully opened. Do not climb a closed stepladder. Make sure that both sides of the  stepladder are locked into place. Ask someone to hold it for you, if possible. Clearly mark and make sure that you can see: Any grab bars or handrails. First and last steps. Where the edge of each step is. Use tools that help you move around (mobility aids) if they are needed. These include: Canes. Walkers. Scooters. Crutches. Turn on the lights when you go into a dark area. Replace any light bulbs as soon as they burn out. Set up your furniture so you have a clear path. Avoid moving your furniture around. If any of your floors are uneven, fix them. If there are any pets around you, be aware of where they are. Review your medicines with your doctor. Some medicines can make you feel dizzy. This can increase your chance of falling. Ask your doctor what other things that you can do to help prevent falls. This information is not intended to replace advice given to you by your health care provider. Make sure you discuss any questions you have with your health care provider. Document Released: 06/08/2009 Document Revised: 01/18/2016 Document Reviewed: 09/16/2014 Elsevier Interactive Patient Education  2017 [employer].

## 2023-04-18 ENCOUNTER — Other Ambulatory Visit (HOSPITAL_COMMUNITY): Payer: Self-pay | Admitting: Internal Medicine

## 2023-04-19 DIAGNOSIS — J9611 Chronic respiratory failure with hypoxia: Secondary | ICD-10-CM | POA: Diagnosis not present

## 2023-04-24 ENCOUNTER — Telehealth: Payer: Self-pay | Admitting: Pulmonary Disease

## 2023-04-24 MED ORDER — TRELEGY ELLIPTA 200-62.5-25 MCG/ACT IN AEPB: 1.0000 | INHALATION_SPRAY | Freq: Every day | RESPIRATORY_TRACT | 0 refills | Status: DC

## 2023-04-24 MED ORDER — BREZTRI AEROSPHERE 160-9-4.8 MCG/ACT IN AERO: 2.0000 | INHALATION_SPRAY | Freq: Two times a day (BID) | RESPIRATORY_TRACT | 0 refills | Status: AC

## 2023-04-24 NOTE — Telephone Encounter (Signed)
PT wife states the  STIOLTO RESPIMAT is not working for him and they seek an alternative. They do not want Trellegy.  Her # is [phone]  [facility] [address]

## 2023-04-24 NOTE — Telephone Encounter (Signed)
Prescription for [facility] sent to the pharmacy and patient informed. Nothing further needed

## 2023-05-15 NOTE — Progress Notes (Signed)
Name: [name]  Age/ Sex: 72 y.o., male   MRN/ DOB: [id number], 08/23/1951     PCP: Wanda Plump, MD   Reason for Endocrinology Evaluation: Type 2 Diabetes Mellitus  Initial Endocrine Consultative Visit: 04/19/2020    PATIENT IDENTIFIER: [name] is a 72 y.o. male with a past medical history of COPD, non-obstructive CAD, severe AS ( S/P AVR/CABG), CHF, HTN , T2DM and OSA. The patient has followed with Endocrinology clinic since 04/19/2020 for consultative assistance with management of his diabetes.  DIABETIC HISTORY:  [name] was diagnosed with DM in 2009. He has been on metformin and insulin for years.Metfromin dose was reduced by 50% due to hypoglycemia per pt.  His hemoglobin A1c has ranged from 7.2% in 2021, peaking at 8.8%in 2018  On his initial visit to our clinic his A1c was 7.2 % We adjusted MDI regimen and continued metformin   Due to a low BMI of 20.99 we checked GAD-65 and islet cell Ab's which were negative    Prandial insulin stopped 10/2020 but by  01/2021 he was already back on it and we continued   Marcelline Deist stopped due to genital rash 01/2021  SUBJECTIVE:   During the last visit (11/12/2022): A1c 7.0%   Today (05/16/2023): [name] is here for a follow up on diabetes. He checks his blood sugars a 2-3  times daily. The patient has not  had hypoglycemic episodes since the last clinic visit.  Continues to follow with hematology for polycythemia vera  He continues to follow-up with pulmonary for COPD SOB is stable  Denies nausea, vomiting  Denies constipation or diarrhea    HOME DIABETES REGIMEN:  Lantus 18 units daily  Metformin 1000 mg , BID Novolog 4 units with lunch and 8 with supper  [id number] (BG- 135/40)     Statin: yes ACE-I/ARB: yes   METER DOWNLOAD SUMMARY: Prodigy unable to download    DIABETIC COMPLICATIONS: Microvascular complications:  Retinopathy Denies: CKD , neuropathy  Last eye exam: Completed 2023    Macrovascular complications:  CAD Denies: PVD, CVA   HISTORY:  Past Medical History:  Past Medical History:  Diagnosis Date   Abnormal myocardial perfusion study 2006   EF 44% ? inferoseptal ischemia. no cath done   Allergy    Benign neoplasm of cecum    Breast mass, right 03/2008   Cardiomyopathy (HCC) 07/20/2018   Ejection fraction 4045% in September 2019   Centrilobular emphysema (HCC) 08/25/2018   02/04/2018-high-res CT- no evidence of interstitial lung disease, severe centrilobular emphysema and mild diffuse bronchial wall thickening suggesting COPD, no calcified pleural plaques no pleural effusion, mild smooth bilateral pleural thickening which is nonspecific  02/04/2018-pulmonary function test- FVC 3.37 (71% predicted), postbronchodilator ratio 50, postbronchodilator FEV1 54, mid flow reve   CHF (congestive heart failure) (HCC)    COLONIC POLYPS, HX OF 03/18/2007   Qualifier: Diagnosis of  By: Nena Jordan    Contact dermatitis 01/31/2013   COPD (chronic obstructive pulmonary disease) (HCC)    "dx'd 12/2017"   COPD GOLD II A  08/25/2018   02/04/2018-high-res CT- no evidence of interstitial lung disease, severe centrilobular emphysema and mild diffuse bronchial wall thickening suggesting COPD, no calcified pleural plaques no pleural effusion, mild smooth bilateral pleural thickening which is nonspecific  02/04/2018-pulmonary function test- FVC 3.37 (71% predicted), postbronchodilator ratio 50, postbronchodilator FEV1 54, mid flow reve   Coronary artery disease    Critical aortic valve stenosis 01/12/2018  Calculated valve area less than 0.5 cm mean gradient more than 60 mm of mercury   DEGENERATIVE JOINT DISEASE, BACK 03/18/2007   Qualifier: Diagnosis of  By: Nena Jordan    Depressed left ventricular ejection fraction 03/28/2020   Diabetes mellitus (HCC) 07/25/2020   Dyslipidemia 04/20/2020   Dyspnea on exertion 01/12/2018   Emphysema of lung (HCC)    ERECTILE  DYSFUNCTION, ORGANIC 02/10/2009   Qualifier: Diagnosis of  By: Nena Jordan    Former cigarette smoker 10/12/2018   Quit in 2012 41-pack-year smoking history   Goals of care, counseling/discussion 08/24/2020   H/O aortic valve replacement 05/05/2020   Heart murmur    HEMORRHOIDS, INTERNAL 03/18/2007   Qualifier: Diagnosis of  By: Nena Jordan    HTN (hypertension) 10/21/2007        Hyperlipemia 10/21/2007   Qualifier: Diagnosis of  By: Nena Jordan    Hypertension    Hypoglycemia due to insulin 03/26/2020   Low back pain    MYOCARDIAL PERFUSION SCAN, WITH STRESS TEST, ABNORMAL 08/11/2008   Qualifier: Diagnosis of  By: Eden Emms, MD, Harrington Challenger    Nocturnal hypoxemia 08/25/2018   04/09/18 >>> ONO - telephone note>>> Patient's ONO on room air showed that he was less than 88% for more than an hour. Patient needs to be on 2 L at HS.  10/06/2018-overnight oximetry-entire duration was 4 hours and 23 minutes, SPO2 less than 88% for 25 minutes and 32 seconds     Oxygen deficiency    uses 2L at night   Paroxysmal atrial fibrillation (HCC) 10/12/2018   PCP NOTES >>>>>>>>>>>>>>>>> 05/14/2020   Polycythemia vera (HCC) 08/04/2020   S/P AVR 03/04/2018   AVR 23 mm Edwards Magna Valve - bioprostetic 03/04/2018 LIMA - LAD   Status post coronary artery bypass graft 03/20/2018   LIMA to LAD during aortic valve replacement surgery 03/04/2018   Type 2 diabetes mellitus with hyperglycemia, with long-term current use of insulin (HCC) 04/20/2020   Type 2 diabetes mellitus, uncontrolled, with neuropathy 05/20/2008   Qualifier: Diagnosis of  By: Nena Jordan    Past Surgical History:  Past Surgical History:  Procedure Laterality Date   ABDOMINAL AORTOGRAM N/A 01/13/2018   Procedure: ABDOMINAL AORTOGRAM;  Surgeon: Lennette Bihari, MD;  [facility];  Service: Cardiovascular;  Laterality: N/A;   AORTIC VALVE REPLACEMENT N/A 03/04/2018   Procedure: AORTIC VALVE  REPLACEMENT (AVR) 23mm Edwards Magna Ease Tissue Valve.;  Surgeon: Loreli Slot, MD;  Location: [facility] OR;  Service: Open Heart Surgery;  Laterality: N/A;   CARDIAC VALVE REPLACEMENT     COLONOSCOPY W/ POLYPECTOMY     COLONOSCOPY WITH PROPOFOL N/A 02/22/2020   Procedure: COLONOSCOPY WITH PROPOFOL;  Surgeon: Sherrilyn Rist, MD;  Location: [facility] END[facility]COPY;  Service: Gastroenterology;  Laterality: N/A;   CORONARY ARTERY BYPASS GRAFT N/A 03/04/2018   Procedure: CORONARY ARTERY BYPASS GRAFTING (CABG) x1:  LIMA to LAD.;  Surgeon: Loreli Slot, MD;  Location: [facility] OR;  Service: Open Heart Surgery;  Laterality: N/A;   CORONARY PRESSURE/FFR STUDY N/A 05/10/2020   Procedure: INTRAVASCULAR PRESSURE WIRE/FFR STUDY;  Surgeon: Tonny Bollman, MD;  Location: [facility] INVASIVE CV LAB;  Service: Cardiovascular;  Laterality: N/A;   HEMOSTASIS CLIP PLACEMENT  02/22/2020   Procedure: HEMOSTASIS CLIP PLACEMENT;  Surgeon: Sherrilyn Rist, MD;  Location: [facility];  Service: Gastroenterology;;   LEFT HEART CATH AND CORS/GRAFTS ANGIOGRAPHY N/A 05/10/2020  Procedure: LEFT HEART CATH AND CORS/GRAFTS ANGIOGRAPHY;  Surgeon: Tonny Bollman, MD;  [facility] INVAS[facility] CV LAB;  Service: Cardiovascular;  Laterality: N/A;   LIPOMA EXCISION Left 07/2008    "back of shoulder" Dr Harlon Flor   POLYPECTOMY  02/22/2020   Procedure: POLYPECTOMY;  Surgeon: Sherrilyn Rist, MD;  Location: [facility];  Service: Gastroenterology;;   RIGHT/LEFT HEART CATH AND CORONARY ANGIOGRAPHY N/A 01/13/2018   Procedure: RIGHT/LEFT HEART CATH AND CORONARY ANGIOGRAPHY;  Surgeon: Lennette Bihari, MD;  Location: [facility] INVASIVE CV LAB;  Service: Cardiovascular;  Laterality: N/A;   TEE WITHOUT CARDIOVERSION N/A 03/04/2018   Procedure: TRANSESOPHAGEAL ECHOCARDIOGRAM (TEE);  Surgeon: Loreli Slot, MD;  Location: [facility] OR;  Service: Open Heart Surgery;  Laterality: N/A;   TONSILLECTOMY     WISDOM TOOTH EXTRACTION     Social History:  reports that  he quit smoking about 24 years ago. His smoking use included cigarettes. He started smoking about 51 years ago. He has a 40.5 pack-year smoking history. He has never used smokeless tobacco. He reports current alcohol use of about 2.0 standard drinks of alcohol per week. He reports that he does not currently use drugs. Family History:  Family History  Problem Relation Age of Onset   Melanoma Father 53       deceased secondary to melanoma   Alzheimer's disease Father    Diabetes Father    Hypertension Mother        alive -60   Colon cancer Neg Hx    Esophageal cancer Neg Hx    Colon polyps Neg Hx    Stomach cancer Neg Hx      HOME MEDICATIONS: Allergies as of 05/16/2023       Reactions   Farxiga [dapagliflozin] Rash, Other (See Comments)   "swollen penis"   Lisinopril Other (See Comments)   Hyperkalemia   Amlodipine Besylate Other (See Comments)   REACTION: ? caused left axillary nodules/chest flutter   Hydrochlorothiazide Hives        Medication List        Accurate as of May 16, 2023  1:03 PM. If you have any questions, ask your nurse or doctor.          acetaminophen 500 MG tablet Commonly known as: TYLENOL Take 1,000 mg by mouth every 6 (six) hours as needed for moderate pain or headache.   amoxicillin 500 MG capsule Commonly known as: AMOXIL Take 4 tablets by mouth 1 hour before dental procedure.   aspirin EC 325 MG tablet Take 1 tablet (325 mg total) by mouth daily.   BD Pen Needle Nano 2nd Gen 32G X 4 MM Misc Generic drug: Insulin Pen Needle Inject 1 Device into the skin in the morning, at noon, in the evening, and at bedtime.   Breztri Aerosphere 160-9-4.8 MCG/ACT Aero Generic drug: Budeson-Glycopyrrol-Formoterol Inhale 2 puffs into the lungs in the morning and at bedtime.   cetirizine 10 MG tablet Commonly known as: ZYRTEC Take 10 mg by mouth daily as needed for allergies.   Entresto 97-103 MG Generic drug: sacubitril-valsartan TAKE 1  TABLET BY MOUTH TWICE A DAY   glucose blood test strip Commonly known as: Prodigy No Coding Blood Gluc Use as instructed to check blood sugar twice a day.  DX  E11.40   hydrALAZINE 25 MG tablet Commonly known as: APRESOLINE TAKE 1 TABLET BY MOUTH THREE TIMES A DAY   Lantus SoloStar 100 UNIT/ML Solostar Pen Generic drug: insulin glargine Inject 18 Units  into the skin daily.   metFORMIN 1000 MG tablet Commonly known as: GLUCOPHAGE Take 1 tablet (1,000 mg total) by mouth 2 (two) times daily with a meal.   metoprolol succinate 50 MG 24 hr tablet Commonly known as: TOPROL-XL TAKE 1 TABLET (50 MG TOTAL) BY MOUTH DAILY. PLEASE CALL FOR OFFICE VISIT [phone]   NovoLOG FlexPen 100 UNIT/ML FlexPen Generic drug: insulin aspart Max daily 30 units What changed: additional instructions   OXYGEN Inhale 2 L/min into the lungs continuous.   rosuvastatin 20 MG tablet Commonly known as: CRESTOR TAKE 1 TABLET BY MOUTH EVERY DAY   spironolactone 25 MG tablet Commonly known as: ALDACTONE TAKE 1/2 TABLET BY MOUTH EVERY DAY   VITAMIN B-12 PO Take 1 tablet by mouth daily.         OBJECTIVE:   Vital Signs: BP 124/72 (BP Location: Left Arm, Patient Position: Sitting, Cuff Size: Small)   Pulse 75   Ht 6\' 4"  (1.93 m)   Wt 172 lb (78 kg)   SpO2 95%   BMI 20.94 kg/m   Wt Readings from Last 3 Encounters:  05/16/23 172 lb (78 kg)  04/10/23 172 lb 8 oz (78.2 kg)  03/31/23 172 lb 6.4 oz (78.2 kg)     Exam: General: Pt appears well and is in NAD  Lungs: Clear with good BS bilat  Heart: RRR   Extremities: No pretibial edema.   Neuro: MS is good with appropriate affect, pt is alert and Ox3       DM Foot Exam 05/16/2023  The skin of the feet is intact without sores or ulcerations. The pedal pulses are 1+ on right and 1+ on left. The sensation is intact to a screening 5.07, 10 gram monofilament bilaterally    DATA REVIEWED:  Lab Results  Component Value Date   HGBA1C 6.7  (A) 05/16/2023   HGBA1C 7.0 (A) 11/12/2022   HGBA1C 7.5 (A) 05/13/2022    Latest Reference Range & Units 03/31/23 13:02  Sodium 135 - 145 mmol/L 139  Potassium 3.5 - 5.1 mmol/L 4.3  Chloride 98 - 111 mmol/L 104  CO2 22 - 32 mmol/L 28  Glucose 70 - 99 mg/dL 098 (H)  BUN 8 - 23 mg/dL 10  Creatinine 1.19 - 1.47 mg/dL 8.29  Calcium 8.9 - 56.2 mg/dL 9.0  Anion gap 5 - 15  7  Alkaline Phosphatase 38 - 126 U/L 41  Albumin 3.5 - 5.0 g/dL 4.2  AST 15 - 41 U/L 13 (L)  ALT 0 - 44 U/L 6  Total Protein 6.5 - 8.1 g/dL 7.5    Latest Reference Range & Units 03/31/23 13:02  Total Bilirubin 0.3 - 1.2 mg/dL 0.5  GFR, Est Non African American >60 mL/min >60       ISLET CELL ANTIBODY SCREEN NEGATIVE NEGATIVE    Glutamic Acid Decarb Ab <5 IU/mL <5      ASSESSMENT / PLAN / RECOMMENDATIONS:   1) Type 2 Diabetes Mellitus, Optimally controlled, With macrovascular  complications - Most recent A1c of 6.7%. Goal A1c < 7.0 %.     - A1c at goal  - He had to stop the [place] due to severe genital infection/irritation -He had declined GLP-1 agonist in the past -In the past he had declined CGM technology, but today my assistant applied Dexcom sensor and a receiver to try for the next 10 days and if he likes his technology I will be happy to prescribe it for him -No changes  MEDICATIONS:  - Continue Metformin 1000 mg, 1 tablet BID - Continue  Lantus  18 units daily  - Continue Novolog 4 units with lunch and 8 units with supper  -Continue [id number] (BG- 135/40)  EDUCATION / INSTRUCTIONS: BG monitoring instructions: Patient is instructed to check his blood sugars 3 times a day, before meals  Call [facility] if: BG persistently < 100  I reviewed the Rule of 15 for the treatment of hypoglycemia in detail with the patient. Literature supplied.     2) Diabetic complications:  Eye: Does not have known diabetic retinopathy.  Neuro/ Feet: Does not have known diabetic  peripheral neuropathy .  Renal: Patient does not have known baseline CKD. He   is  on an ACEI/ARB at present.   F/U in 6 months    Signed electronically by: Lyndle Herrlich, MD  [facility]  [facility] [address] [place] [place], [place] [address] Phone: [phone] FAX: [phone]   CC: Wanda Plump, MD [address] [place] [address] Phone: [phone]  Fax: [phone]  Return to Endocrinology clinic as below: Future Appointments  Date Time Provider Department Center  06/13/2023 10:20 AM Wanda Plump, MD [facility]  10/01/2023 12:45 PM [facility] None  10/01/2023  1:00 PM Erenest Blank, NP [facility] None  11/17/2023 10:50 AM Avamae Dehaan, Konrad Dolores, MD [facility] None  04/13/2024  8:20 AM [facility] ANNUAL WELLNESS VISIT 1 [facility]

## 2023-05-16 ENCOUNTER — Ambulatory Visit: Payer: Medicare PPO | Admitting: Internal Medicine

## 2023-05-16 ENCOUNTER — Encounter: Payer: Self-pay | Admitting: Internal Medicine

## 2023-05-16 ENCOUNTER — Other Ambulatory Visit (HOSPITAL_COMMUNITY): Payer: Self-pay | Admitting: Internal Medicine

## 2023-05-16 VITALS — BP 124/72 | HR 75 | Ht 76.0 in | Wt 172.0 lb

## 2023-05-16 DIAGNOSIS — Z794 Long term (current) use of insulin: Secondary | ICD-10-CM

## 2023-05-16 DIAGNOSIS — E1165 Type 2 diabetes mellitus with hyperglycemia: Secondary | ICD-10-CM | POA: Diagnosis not present

## 2023-05-16 LAB — POCT GLYCOSYLATED HEMOGLOBIN (HGB A1C): Hemoglobin A1C: 6.7 % — AB (ref 4.0–5.6)

## 2023-05-16 LAB — MICROALBUMIN / CREATININE URINE RATIO
Creatinine,U: 32.1 mg/dL
Microalb Creat Ratio: 6.4 mg/g (ref 0.0–30.0)
Microalb, Ur: 2.1 mg/dL — ABNORMAL HIGH (ref 0.0–1.9)

## 2023-05-16 MED ORDER — METFORMIN HCL 1000 MG PO TABS: 1000.0000 mg | ORAL_TABLET | Freq: Two times a day (BID) | ORAL | 3 refills | Status: DC

## 2023-05-16 MED ORDER — LANTUS SOLOSTAR 100 UNIT/ML ~~LOC~~ SOPN: 18.0000 [IU] | PEN_INJECTOR | Freq: Every day | SUBCUTANEOUS | 11 refills | Status: DC

## 2023-05-16 MED ORDER — BD PEN NEEDLE NANO 2ND GEN 32G X 4 MM MISC: 1.0000 | Freq: Four times a day (QID) | 3 refills | Status: DC

## 2023-05-16 MED ORDER — NOVOLOG FLEXPEN 100 UNIT/ML ~~LOC~~ SOPN: PEN_INJECTOR | SUBCUTANEOUS | 4 refills | Status: DC

## 2023-05-16 NOTE — Patient Instructions (Signed)
-   Continue  Metformin 1000 mg, 1 tablet TWICE a day  - Continue  Lantus 18 units daily  - Continue  NOVOLOG 4 units with Lunch and 8 units with Supper  - Novlog correctional insulin: ADD extra units on insulin to your meal-time Novolog  dose if your blood sugars are higher than 185. Use the scale below to help guide you:    Blood sugar before meal Number of units to inject  Less than 185 0 unit  186 - 225 1 units  226 -  265 2 units  265 -  305 3 units  306 -  345 4 units

## 2023-05-20 DIAGNOSIS — J9611 Chronic respiratory failure with hypoxia: Secondary | ICD-10-CM | POA: Diagnosis not present

## 2023-05-30 ENCOUNTER — Ambulatory Visit: Payer: Medicare PPO | Admitting: Internal Medicine

## 2023-06-03 NOTE — Progress Notes (Unsigned)
Cardiology Office Note   Date:  06/04/2023  ID:  [name], DOB [birth year], MRN [id number] PCP:  Wanda Plump, MD [facility] Cardiologist: Thomasene Ripple, DO  Reason for visit: Shortness of breath  History of Present Illness    [name] is a 72 y.o. male with a hx of coronary artery disease status post CABG in 2019, ischemic cardiomyopathy, status post bio aortic valve replacement 2019, hypertension, hyperlipidemia, diabetes, polycythemia vera.  Note, Marcelline Deist stopped due to genital infection/irritation 01/2021.    He last saw Dr. Servando Salina in March 2024.  He had no complaints from a heart standpoint.  Recommended to follow-up in 9 months.  Today, patient complains of increased dyspnea exertion with yard work, Designer, jewellery or digging in the garden.  He states he follows with pulmonary for COPD.  He thinks his inhaler made it worse so he stopped using it.  He denies shortness of breath when walking around the house or going through stores.  He denies chest pain, lower extremity edema and palpitations.  He can sometimes feel little lightheaded no feelings of presyncope.  Blood pressure at home typically 140s over 80s.       Objective / Physical Exam   EKG today: Normal sinus rhythm, T wave inversions/minimal ST depression inferiorly  Vital signs:  BP (!) 162/84   Pulse 86   Ht 6\' 4"  (1.93 m)   Wt 172 lb (78 kg)   BMI 20.94 kg/m     GEN: No acute distress NECK: No carotid bruits CARDIAC: RRR, no murmurs RESPIRATORY:  Clear to auscultation without rales, wheezing or rhonchi  EXTREMITIES: No edema  Assessment and Plan   Dyspnea on exertion -Unclear cause -whether more related to pulmonary cause or cardiac.  Lungs clear on exam -Due for annual echo to follow AVR -we will schedule -Will trial Imdur 30 mg daily for possible anginal equivalent given CAD on [facility] 2021.  Also hope that this will help blood pressure control. -Recommend follow-up with  pulmonary  Coronary artery disease -Status post CABG 02/2018 LIMA - LAD  -LHC 2021: Moderate two-vessel coronary artery disease of the LAD and ramus intermedius branch, both with 60 to 70% lesions with negative pressure wire analysis.  Patent but atretic LIMA graft  -EKG with minimal inferior ST depression.  -Start Imdur 30 mg daily for possible anginal equivalent. -Continue beta-blocker therapy. -Continue aspirin and statin therapy.  Ischemic cardiomyopathy, euvolemic -EF improved to 50-55% on echo October 2023 -Continue Toprol, 50 mg daily, Entresto 97-103 mg twice daily and spironolactone 12.5 mg daily.  Status post AVR -AVR 23 mm Edwards Magna Valve - bioprostetic 02/2018 -Echo October 2023: Valve well-seated/normal function -Check echo annually --> we will schedule -Syncopal episode led to initial finding of aortic valve disease.  Patient denies presyncope/syncope today.  Hypertension, BP elevated -Patient states he has whitecoat hypertension. -See if BP improved on Imdur. -Requested patient bring blood pressure log to appointment in 4 to 6 weeks.   -Have room to increase hydralazine if needed. -Goal BP is <130/80.  Recommend DASH diet (high in vegetables, fruits, low-fat dairy products, whole grains, poultry, fish, and nuts and low in sweets, sugar-sweetened beverages, and red meats), salt restriction and increase physical activity.  Hyperlipidemia -LDL 54 in October 2023.  Continue Crestor 20 mg daily. -Recommend cholesterol lowering diets - Mediterranean diet, DASH diet, vegetarian diet, low-carbohydrate diet and avoidance of trans fats.  Discussed healthier choice substitutes.  Nuts, high-fiber foods,  and fiber supplements may also improve lipids.    Disposition - Follow-up in 4-6 weeks.    Signed, Cannon Kettle, PA-C  06/04/2023 [facility]

## 2023-06-04 ENCOUNTER — Encounter: Payer: Self-pay | Admitting: Physician Assistant

## 2023-06-04 ENCOUNTER — Ambulatory Visit: Payer: Medicare PPO | Attending: Physician Assistant | Admitting: Physician Assistant

## 2023-06-04 VITALS — BP 162/84 | HR 86 | Ht 76.0 in | Wt 172.0 lb

## 2023-06-04 DIAGNOSIS — I10 Essential (primary) hypertension: Secondary | ICD-10-CM

## 2023-06-04 DIAGNOSIS — Z952 Presence of prosthetic heart valve: Secondary | ICD-10-CM

## 2023-06-04 DIAGNOSIS — R0609 Other forms of dyspnea: Secondary | ICD-10-CM | POA: Diagnosis not present

## 2023-06-04 DIAGNOSIS — E785 Hyperlipidemia, unspecified: Secondary | ICD-10-CM | POA: Diagnosis not present

## 2023-06-04 DIAGNOSIS — I2510 Atherosclerotic heart disease of native coronary artery without angina pectoris: Secondary | ICD-10-CM | POA: Diagnosis not present

## 2023-06-04 DIAGNOSIS — I255 Ischemic cardiomyopathy: Secondary | ICD-10-CM | POA: Diagnosis not present

## 2023-06-04 MED ORDER — METOPROLOL SUCCINATE ER 50 MG PO TB24: 50.0000 mg | ORAL_TABLET | Freq: Every day | ORAL | 3 refills | Status: DC

## 2023-06-04 MED ORDER — ISOSORBIDE MONONITRATE ER 30 MG PO TB24: 30.0000 mg | ORAL_TABLET | Freq: Every day | ORAL | 3 refills | Status: DC

## 2023-06-04 NOTE — Patient Instructions (Signed)
Medication Instructions:  Start Imdur 30 mg ( Take 1 Tablet Daily). *If you need a refill on your cardiac medications before your next appointment, please call your pharmacy*   Lab Work: No Labs If you have labs (blood work) drawn today and your tests are completely normal, you will receive your results only by: MyChart Message (if you have MyChart) OR A paper copy in the mail If you have any lab test that is abnormal or we need to change your treatment, we will call you to review the results.   Testing/Procedures: [address]. Your physician has requested that you have an echocardiogram. Echocardiography is a painless test that uses sound waves to create images of your heart. It provides your doctor with information about the size and shape of your heart and how well your heart's chambers and valves are working. This procedure takes approximately one hour. There are no restrictions for this procedure. Please do NOT wear cologne, perfume, aftershave, or lotions (deodorant is allowed). Please arrive 15 minutes prior to your appointment time.    Follow-Up: At [facility], you and your health needs are our priority.  As part of our continuing mission to provide you with exceptional heart care, we have created designated Provider Care Teams.  These Care Teams include your primary Cardiologist (physician) and Advanced Practice Providers (APPs -  Physician Assistants and Nurse Practitioners) who all work together to provide you with the care you need, when you need it.  We recommend signing up for the patient portal called "MyChart".  Sign up information is provided on this After Visit Summary.  MyChart is used to connect with patients for Virtual Visits (Telemedicine).  Patients are able to view lab/test results, encounter notes, upcoming appointments, etc.  Non-urgent messages can be sent to your provider as well.   To learn more about what you can do with  MyChart, go to [email or web address].    Your next appointment:   4-6 week(s)  Provider:   Juanda Crumble, PA-C      Other Instructions Monitor Dizziness and Headaches on Imdur . Bring Blood Pressure Log to Follow up Appointment.

## 2023-06-13 ENCOUNTER — Ambulatory Visit: Payer: Medicare PPO | Admitting: Internal Medicine

## 2023-06-13 ENCOUNTER — Encounter: Payer: Self-pay | Admitting: Internal Medicine

## 2023-06-18 ENCOUNTER — Ambulatory Visit: Payer: Medicare PPO | Admitting: Internal Medicine

## 2023-06-18 ENCOUNTER — Encounter: Payer: Self-pay | Admitting: Internal Medicine

## 2023-06-18 VITALS — BP 126/68 | HR 73 | Temp 98.3°F | Resp 16 | Ht 76.0 in | Wt 172.2 lb

## 2023-06-18 DIAGNOSIS — J449 Chronic obstructive pulmonary disease, unspecified: Secondary | ICD-10-CM | POA: Diagnosis not present

## 2023-06-18 DIAGNOSIS — I10 Essential (primary) hypertension: Secondary | ICD-10-CM | POA: Diagnosis not present

## 2023-06-18 DIAGNOSIS — Z23 Encounter for immunization: Secondary | ICD-10-CM | POA: Diagnosis not present

## 2023-06-18 DIAGNOSIS — E785 Hyperlipidemia, unspecified: Secondary | ICD-10-CM | POA: Diagnosis not present

## 2023-06-18 DIAGNOSIS — J9610 Chronic respiratory failure, unspecified whether with hypoxia or hypercapnia: Secondary | ICD-10-CM

## 2023-06-18 LAB — LIPID PANEL
Cholesterol: 124 mg/dL (ref 0–200)
HDL: 44.1 mg/dL (ref >39.00)
LDL Cholesterol: 63 mg/dL (ref 0–99)
NonHDL: 79.54
Total CHOL/HDL Ratio: 3
Triglycerides: 83 mg/dL (ref 0.0–149.0)
VLDL: 16.6 mg/dL (ref 0.0–40.0)

## 2023-06-18 MED ORDER — BUDESONIDE-FORMOTEROL FUMARATE 160-4.5 MCG/ACT IN AERO: 2.0000 | INHALATION_SPRAY | Freq: Two times a day (BID) | RESPIRATORY_TRACT | 3 refills | Status: DC

## 2023-06-18 NOTE — Assessment & Plan Note (Signed)
DM: Per Endo, LOV 05/16/2023. COPD, exertional hypoxia.  Saw pulmonary 03/12/2023, recommend annual CT chest On chart review, he was not able to get Anoro, declined   to use Trelegy as I recommended ("wife said it causes side effects"), pulmonary rx Stiolto, patient states--  "it did not help, make me feel worse". He was quite hypoxic when he arrived to the examining room, I rechecked the O2 sat: 91%.   Plan: Symbicort.  Encourage strict use of oxygen. CAD, cardiomyopathy: Cardiology visit 06/04/2023.  Added Imdur (DOE angina equivalent :?).  He actually feels better with less DOE.  Will notify cardiology. HTN: BP improved in the ambulatory setting since cardiology added Imdur.  Also on hydralazine, metoprolol, Entresto, Aldactone.  Last BMP okay High cholesterol: On Crestor 20 mg, check FLP.   Polycythemia vera, JAK2 +, saw hematology 03/31/2023. Vaccine advised: Flu shot today. RTC 4 months

## 2023-06-18 NOTE — Progress Notes (Signed)
Subjective:    Patient ID: [name], male    DOB: 12-11-1950, 72 y.o.   MRN: [id number]  DOS:  06/18/2023 Type of visit - description: Routine checkup  Routine checkup. The patient presented without oxygen supplements, O2 sat after he walk from the front office to the room was 84%. He was not SOB, no DOE, he also denies cough or wheezing.  Cardiology note reviewed, Imdur was started, that has helped both his blood pressure and DOE.   Review of Systems See above   Past Medical History:  Diagnosis Date   Abnormal myocardial perfusion study 2006   EF 44% ? inferoseptal ischemia. no cath done   Allergy    Benign neoplasm of cecum    Breast mass, right 03/2008   Cardiomyopathy (HCC) 07/20/2018   Ejection fraction 4045% in September 2019   Centrilobular emphysema (HCC) 08/25/2018   02/04/2018-high-res CT- no evidence of interstitial lung disease, severe centrilobular emphysema and mild diffuse bronchial wall thickening suggesting COPD, no calcified pleural plaques no pleural effusion, mild smooth bilateral pleural thickening which is nonspecific  02/04/2018-pulmonary function test- FVC 3.37 (71% predicted), postbronchodilator ratio 50, postbronchodilator FEV1 54, mid flow reve   CHF (congestive heart failure) (HCC)    COLONIC POLYPS, HX OF 03/18/2007   Qualifier: Diagnosis of  By: Nena Jordan    Contact dermatitis 01/31/2013   COPD (chronic obstructive pulmonary disease) (HCC)    "dx'd 12/2017"   COPD GOLD II A  08/25/2018   02/04/2018-high-res CT- no evidence of interstitial lung disease, severe centrilobular emphysema and mild diffuse bronchial wall thickening suggesting COPD, no calcified pleural plaques no pleural effusion, mild smooth bilateral pleural thickening which is nonspecific  02/04/2018-pulmonary function test- FVC 3.37 (71% predicted), postbronchodilator ratio 50, postbronchodilator FEV1 54, mid flow reve   Coronary artery disease    Critical aortic valve  stenosis 01/12/2018   Calculated valve area less than 0.5 cm mean gradient more than 60 mm of mercury   DEGENERATIVE JOINT DISEASE, BACK 03/18/2007   Qualifier: Diagnosis of  By: Nena Jordan    Depressed left ventricular ejection fraction 03/28/2020   Diabetes mellitus (HCC) 07/25/2020   Dyslipidemia 04/20/2020   Dyspnea on exertion 01/12/2018   Emphysema of lung (HCC)    ERECTILE DYSFUNCTION, ORGANIC 02/10/2009   Qualifier: Diagnosis of  By: Nena Jordan    Former cigarette smoker 10/12/2018   Quit in 2012 41-pack-year smoking history   Goals of care, counseling/discussion 08/24/2020   H/O aortic valve replacement 05/05/2020   Heart murmur    HEMORRHOIDS, INTERNAL 03/18/2007   Qualifier: Diagnosis of  By: Nena Jordan    HTN (hypertension) 10/21/2007        Hyperlipemia 10/21/2007   Qualifier: Diagnosis of  By: Nena Jordan    Hypertension    Hypoglycemia due to insulin 03/26/2020   Low back pain    MYOCARDIAL PERFUSION SCAN, WITH STRESS TEST, ABNORMAL 08/11/2008   Qualifier: Diagnosis of  By: Eden Emms, MD, Harrington Challenger    Nocturnal hypoxemia 08/25/2018   04/09/18 >>> ONO - telephone note>>> Patient's ONO on room air showed that he was less than 88% for more than an hour. Patient needs to be on 2 L at HS.  10/06/2018-overnight oximetry-entire duration was 4 hours and 23 minutes, SPO2 less than 88% for 25 minutes and 32 seconds     Oxygen deficiency    uses 2L at night  Paroxysmal atrial fibrillation (HCC) 10/12/2018   PCP NOTES >>>>>>>>>>>>>>>>> 05/14/2020   Polycythemia vera (HCC) 08/04/2020   S/P AVR 03/04/2018   AVR 23 mm Edwards Magna Valve - bioprostetic 03/04/2018 LIMA - LAD   Status post coronary artery bypass graft 03/20/2018   LIMA to LAD during aortic valve replacement surgery 03/04/2018   Type 2 diabetes mellitus with hyperglycemia, with long-term current use of insulin (HCC) 04/20/2020   Type 2 diabetes mellitus, uncontrolled, with  neuropathy 05/20/2008   Qualifier: Diagnosis of  By: Nena Jordan     Past Surgical History:  Procedure Laterality Date   ABDOMINAL AORTOGRAM N/A 01/13/2018   Procedure: ABDOMINAL AORTOGRAM;  Surgeon: Lennette Bihari, MD;  Location[facility] INVASIVE CV LAB;  Service: Cardiovascular;  Laterality: N/A;   AORTIC VALVE REPLACEMENT N/A 03/04/2018   Procedure: AORTIC VALVE REPLACEMENT (AVR) 23mm Edwards Magna Ease Tissue Valve.;  Surgeon: Loreli Slot, MD;  Location: [facility] OR;  Service: Open Heart Surgery;  Laterality: N/A;   CARDIAC VALVE REPLACEMENT     COLONOSCOPY W/ POLYPECTOMY     COLONOSCOPY WITH PROPOFOL N/A 02/22/2020   Procedure: COLONOSCOPY WITH PROPOFOL;  Surgeon: Sherrilyn Rist, MD;  Location: [facility] ENDOSCOPY;  Service: Gastroenterology;  Laterality: N/A;   CORONARY ARTERY BYPASS GRAFT N/A 03/04/2018   Procedure: CORONARY ARTERY BYPASS GRAFTING (CABG) x1:  LIMA to LAD.;  Surgeon: Loreli Slot, MD;  Location: [facility] OR;  Service: Open Heart Surgery;  Laterality: N/A;   CORONARY PRESSURE/FFR STUDY N/A 05/10/2020   Procedure: INTRAVASCULAR PRESSURE WIRE/FFR STUDY;  Surgeon: Tonny Bollman, MD;  Location[facility] INVASIVE CV LAB;  Service: Cardiovascular;  Laterality: N/A;   HEMOSTASIS CLIP PLACEMENT  02/22/2020   Procedure: HEMOSTASIS CLIP PLACEMENT;  Surgeon: Sherrilyn Rist, MD;  Location: [facility] ENDOSCOPY;  Service: Gastroenterology;;   LEFT HEART CATH AND CORS/GRAFTS ANGIOGRAPHY N/A 05/10/2020   Procedure: LEFT HEART CATH AND CORS/GRAFTS ANGIOGRAPHY;  Surgeon: Tonny Bollman, MD;  Location: [facility] INVASIVE CV LAB;  Service: Cardiovascular;  Laterality: N/A;   LIPOMA EXCISION Left 07/2008    "back of shoulder" Dr Harlon Flor   POLYPECTOMY  02/22/2020   Procedure: POLYPECTOMY;  Surgeon: Sherrilyn Rist, MD;  Location: [facility] ENDOSCOPY;  Service: Gastroenterology;;   RIGHT/LEFT HEART CATH AND CORONARY ANGIOGRAPHY N/A 01/13/2018   Procedure: RIGHT/LEFT HEART CATH AND CORONARY ANGIOGRAPHY;  Surgeon:  Lennette Bihari, MD;  Location: [facility] INVASIVE CV LAB;  Service: Cardiovascular;  Laterality: N/A;   TEE WITHOUT CARDIOVERSION N/A 03/04/2018   Procedure: TRANSESOPHAGEAL ECHOCARDIOGRAM (TEE);  Surgeon: Loreli Slot, MD;  Location: [facility] OR;  Service: Open Heart Surgery;  Laterality: N/A;   TONSILLECTOMY     WISDOM TOOTH EXTRACTION      Current Outpatient Medications  Medication Instructions   acetaminophen (TYLENOL) 1,000 mg, Oral, Every 6 hours PRN   amoxicillin (AMOXIL) 500 MG capsule Take 4 tablets by mouth 1 hour before dental procedure.   aspirin EC 325 mg, Oral, Daily   budesonide-formoterol (SYMBICORT) 160-4.5 MCG/ACT inhaler 2 puffs, Inhalation, 2 times daily   cetirizine (ZYRTEC) 10 mg, Oral, Daily PRN   Cyanocobalamin (VITAMIN B-12 PO) 1 tablet, Oral, Daily   glucose blood (PRODIGY NO CODING BLOOD GLUC) test strip Use as instructed to check blood sugar twice a day.  DX  E11.40    hydrALAZINE (APRESOLINE) 25 mg, Oral, 3 times daily   insulin aspart (NOVOLOG FLEXPEN) 100 UNIT/ML FlexPen Max daily 30 units   Insulin Pen Needle (BD PEN  NEEDLE NANO 2ND GEN) 32G X 4 MM MISC 1 Device, Subcutaneous, 4 times daily   isosorbide mononitrate (IMDUR) 30 mg, Oral, Daily   Lantus SoloStar 18 Units, Subcutaneous, Daily   metFORMIN (GLUCOPHAGE) 1,000 mg, Oral, 2 times daily with meals   metoprolol succinate (TOPROL-XL) 50 mg, Oral, Daily   OXYGEN 2 L/min, Continuous   rosuvastatin (CRESTOR) 20 MG tablet TAKE 1 TABLET BY MOUTH EVERY DAY   sacubitril-valsartan (ENTRESTO) 97-103 MG 1 tablet, Oral, 2 times daily   spironolactone (ALDACTONE) 25 MG tablet TAKE 1/2 TABLET BY MOUTH EVERY DAY       Objective:   Physical Exam BP 126/68   Pulse 73   Temp 98.3 F (36.8 C) (Oral)   Resp 16   Ht 6\' 4"  (1.93 m)   Wt 172 lb 4 oz (78.1 kg)   SpO2 91% Comment: room air  BMI 20.97 kg/m  General:   Well developed, NAD, BMI noted. HEENT:  Normocephalic . Face symmetric, atraumatic Lungs:   Decreased breath sounds. Normal respiratory effort, no intercostal retractions, no accessory muscle use. Heart: RRR,  no murmur.  Lower extremities: no pretibial edema bilaterally  Skin: Not pale. Not jaundice Neurologic:  alert & oriented X3.  Speech normal, gait appropriate for age and unassisted Psych--  Cognition and judgment appear intact.  Cooperative with normal attention span and concentration.  Behavior appropriate. No anxious or depressed appearing.      Assessment   Assessment (new patient 03/2020) DM HTN CAD, CABG and aortic valve replacement due to stenosis on 02/2018 Cardiomyopathy, cath 05/10/2020, Rx medical treatment COPD 02/04/2018-high-res CT- no evidence of interstitial lung disease, severe centrilobular emphysema  02/04/2018-pulmonary function test- FVC 3.37 (71% predicted),  mid flow reversibility after bronchodilator  Polycythemia vera, + JAK2 Dx [id number].  PLAN:  DM: Per Endo, LOV 05/16/2023. COPD, exertional hypoxia.  Saw pulmonary 03/12/2023, recommend annual CT chest On chart review, he was not able to get Anoro, declined   to use Trelegy as I recommended ("wife said it causes side effects"), pulmonary rx Stiolto, patient states--  "it did not help, make me feel worse". He was quite hypoxic when he arrived to the examining room, I rechecked the O2 sat: 91%.   Plan: Symbicort.  Encourage strict use of oxygen. CAD, cardiomyopathy: Cardiology visit 06/04/2023.  Added Imdur (DOE angina equivalent :?).  He actually feels better with less DOE.  Will notify cardiology. HTN: BP improved in the ambulatory setting since cardiology added Imdur.  Also on hydralazine, metoprolol, Entresto, Aldactone.  Last BMP okay High cholesterol: On Crestor 20 mg, check FLP.   Polycythemia vera, JAK2 +, saw hematology 03/31/2023. Vaccine advised: Flu shot today. RTC 4 months

## 2023-06-18 NOTE — Patient Instructions (Addendum)
Try new inhaler called Symbicort.  I do not anticipate side effects.  It should not be very expensive.  Let me know if you cannot tolerate it  Use your oxygen 24/7  vaccines I recommend: Covid booster RSV vaccine Shingrix (shingles) Tdap (tetanus)   Check the  blood pressure regularly Blood pressure goal:  between 110/65 and  135/85. If it is consistently higher or lower, let me know     GO TO THE LAB : Get the blood work     Next visit with me in 4 months.  For a complete physical exam.  Please schedule it at the front desk

## 2023-06-19 DIAGNOSIS — J9611 Chronic respiratory failure with hypoxia: Secondary | ICD-10-CM | POA: Diagnosis not present

## 2023-07-01 ENCOUNTER — Ambulatory Visit (HOSPITAL_COMMUNITY): Payer: Medicare PPO | Attending: Cardiology

## 2023-07-01 DIAGNOSIS — I2510 Atherosclerotic heart disease of native coronary artery without angina pectoris: Secondary | ICD-10-CM

## 2023-07-01 DIAGNOSIS — Z952 Presence of prosthetic heart valve: Secondary | ICD-10-CM | POA: Diagnosis not present

## 2023-07-01 DIAGNOSIS — E785 Hyperlipidemia, unspecified: Secondary | ICD-10-CM | POA: Diagnosis not present

## 2023-07-01 DIAGNOSIS — I255 Ischemic cardiomyopathy: Secondary | ICD-10-CM

## 2023-07-01 DIAGNOSIS — I10 Essential (primary) hypertension: Secondary | ICD-10-CM | POA: Diagnosis not present

## 2023-07-01 LAB — ECHOCARDIOGRAM COMPLETE
AR max vel: 0.84 cm2
AV Area VTI: 0.86 cm2
AV Area mean vel: 0.85 cm2
AV Mean grad: 26 mm[Hg]
AV Peak grad: 47.7 mm[Hg]
Ao pk vel: 3.45 m/s
Area-P 1/2: 3.29 cm2
P 1/2 time: 57 ms
S' Lateral: 3.3 cm

## 2023-07-06 NOTE — Progress Notes (Unsigned)
Cardiology Office Note   Date:  11/13/[name]  ID:  [name], DOB 06-23-1951, MRN [id number] PCP:  [name] Plump, MD [facility] Cardiologist: [name] Ripple, DO  Reason for visit: 4-6 week follow-up  History of Present Illness    [name] is a 72 y.o. male with a hx of coronary artery disease status post CABG in 2019, ischemic cardiomyopathy, status post bio aortic valve replacement 2019, hypertension, hyperlipidemia, diabetes, polycythemia vera.  Note, [name] stopped due to genital infection/irritation 01/2021.     [name].  He complained of increased dyspnea with exertion doing yard work, Designer, jewellery or digging in the garden.  We started Imdur 30 mg daily for possible anginal equivalent given CAD on left heart cath 2021 & to help with blood pressure control.  Today, patient's wife accompanies him to this visit.  They complained of decreased stamina x 6 months.  Patient states associated dyspnea with moderate exertion.  He notices particularly after vacuuming, walking up a hill or doing chores.  He feels like he has to rest.  He recently started inhalers for COPD on October 23, [name].  He has noticed decrease in phlegm production.   Patient denies chest pain, presyncope/syncope, PND, orthopnea, lower extremity edema and palpitations.  They mention that patient did have a severe headache for about 1 week after starting Imdur.  This has since resolved.  He does not think Imdur has significantly changed his symptoms but they do think it is worth trialing a little longer.  Blood pressure log shows systolic blood pressures often 140s sometimes up to 150s.  Diastolics often 80s sometimes up to low 90s.  Occasional systolic blood pressure in the 130s.   Objective / Physical Exam   Vital signs:  BP 134/78 (BP Location: Left Arm, Patient Position: Sitting, Cuff Size: Normal)   Pulse 70   Ht 6\' 4"  (1.93 m)   Wt 177 lb 12.8 oz (80.6 kg)   SpO2 93%    BMI 21.64 kg/m     GEN: No acute distress NECK: No carotid bruits CARDIAC: RRR, no murmurs RESPIRATORY:  Clear to auscultation without rales, wheezing or rhonchi  EXTREMITIES: No edema  Assessment and Plan   Dyspnea on exertion -Unclear cause -whether more related to pulmonary cause or cardiac.  Hx of COPD, Oxygen therapy recommended by PCP.  Continue inhalers & f/u with PCP/pulm -Echo 07/01/23 showed EF 60 to 65%, normal diastolic parameters, normal RV, increased gradients over the aortic valve prosthesis.  "Peak and mean gradients through the valve are 49 and 26 mm Hg respectively. AVA (VTI) is 0.85 cm2.  Compared to October 2023, mean gradient is increased (15 to 26 mm Hg) suspicious for possible stenosis."  -I have asked [name] & [name] to review his echo for further clarification.  Patient does not have a significant murmur on exam. -Will order cardiac PET stress to evaluate for ischemia as this causes symptoms.  Coronary artery disease with possible anginal equivalent -Status post CABG 02/2018 LIMA - LAD  -LHC 2021: Moderate two-vessel coronary artery disease of the LAD and ramus intermedius branch, both with 60 to 70% lesions with negative pressure wire analysis.  Patent but atretic LIMA graft  -Cardiac PET stress to check for ischemia. -Continue Imdur 30 mg daily for now. -Continue beta-blocker therapy. -Continue aspirin and statin therapy.   Ischemic cardiomyopathy, euvolemic -EF improved to 50-55% on echo October 2023 -Continue Toprol, 50 mg daily, Entresto  97-103 mg twice daily and spironolactone 12.5 mg daily. -Continue Imdur and hydralazine.   Status post AVR -Syncopal episode led to initial finding of aortic valve disease. -AVR 23 mm Edwards [facility] Valve - bioprostetic 02/2018 -Echo October 2023: Valve well-seated/normal function -Echo November [name] with EF 60 to 65%, normal diastolic parameters, normal RV, increased gradients over the aortic valve prosthesis.  Peak  and mean gradients through the valve are 49 and 26 mm Hg respectively. AVA (VTI) is 0.85 cm2.  Compared to October 2023, mean gradient is increased (15 to 26 mm Hg) suspicious for possible stenosis. --> Re-reviewing echo.  For now, plan to repeat echo in 1 year -No significant murmur on exam -No heart failure symptoms.   Hypertension -With systolic blood pressures often over 140, will increase hydralazine to 50 mg 3 times daily. -Continue blood pressure log. -Goal BP is <130/80.  Recommend DASH diet (high in vegetables, fruits, low-fat dairy products, whole grains, poultry, fish, and nuts and low in sweets, sugar-sweetened beverages, and red meats), salt restriction and increase physical activity.   Hyperlipidemia -LDL 54 in October 2023.  Continue Crestor 20 mg daily.  Disposition - Follow-up in 2 to 3 months.   Signed, Cannon Kettle, PA-C  11/13/[name] [facility]

## 2023-07-09 ENCOUNTER — Other Ambulatory Visit: Payer: Self-pay | Admitting: Physician Assistant

## 2023-07-09 ENCOUNTER — Ambulatory Visit: Payer: Medicare PPO | Attending: Physician Assistant | Admitting: Physician Assistant

## 2023-07-09 ENCOUNTER — Encounter: Payer: Self-pay | Admitting: Physician Assistant

## 2023-07-09 VITALS — BP 134/78 | HR 70 | Ht 76.0 in | Wt 177.8 lb

## 2023-07-09 DIAGNOSIS — Z952 Presence of prosthetic heart valve: Secondary | ICD-10-CM | POA: Diagnosis not present

## 2023-07-09 DIAGNOSIS — I10 Essential (primary) hypertension: Secondary | ICD-10-CM

## 2023-07-09 DIAGNOSIS — R0609 Other forms of dyspnea: Secondary | ICD-10-CM | POA: Diagnosis not present

## 2023-07-09 DIAGNOSIS — I255 Ischemic cardiomyopathy: Secondary | ICD-10-CM

## 2023-07-09 DIAGNOSIS — I2510 Atherosclerotic heart disease of native coronary artery without angina pectoris: Secondary | ICD-10-CM

## 2023-07-09 MED ORDER — HYDRALAZINE HCL 50 MG PO TABS
50.0000 mg | ORAL_TABLET | Freq: Three times a day (TID) | ORAL | 3 refills | Status: DC
Start: 2023-07-09 — End: 2023-10-02

## 2023-07-09 NOTE — Addendum Note (Signed)
Addended by: Juanda Crumble on: 07/09/2023 01:04 PM   Modules accepted: Orders

## 2023-07-09 NOTE — Addendum Note (Signed)
Addended by: Myna Hidalgo A on: 07/09/2023 11:27 AM   Modules accepted: Orders

## 2023-07-09 NOTE — Patient Instructions (Signed)
Medication Instructions:  Increase Hydralazine to 50 mg ( Take 1 Tablet  (3) Three Times Daily) *If you need a refill on your cardiac medications before your next appointment, please call your pharmacy*   Lab Work: No Labs If you have labs (blood work) drawn today and your tests are completely normal, you will receive your results only by: MyChart Message (if you have MyChart) OR A paper copy in the mail If you have any lab test that is abnormal or we need to change your treatment, we will call you to review the results.   Testing/Procedures: How to Prepare for Your Cardiac PET/CT Stress Test:  1. Please do not take these medications before your test:   Medications that may interfere with the cardiac pharmacological stress agent (ex. nitrates - including erectile dysfunction medications, isosorbide mononitrate- [please start to hold this medication the day before the test], tamulosin or beta-blockers) the day of the exam. (Erectile dysfunction medication should be held for at least 72 hrs prior to test) Theophylline containing medications for 12 hours. Dipyridamole 48 hours prior to the test. Your remaining medications may be taken with water.  2. Nothing to eat or drink, except water, 3 hours prior to arrival time.   NO caffeine/decaffeinated products, or chocolate 12 hours prior to arrival.  3. NO perfume, cologne or lotion on chest or abdomen area.          - FEMALES - Please avoid wearing dresses to this appointment.  4. Total time is 1 to 2 hours; you may want to bring reading material for the waiting time.  5. Please report to Radiology at the [facility] Main Entrance 30 minutes early for your test.  [address] [place], [place] [address]  6. Please report to Radiology at [facility] Main Entrance, medical mall, 30 mins prior to your test.  [address]  [place], [place]  [phone]  Diabetic Preparation:  Hold oral  medications. You may take NPH and Lantus insulin. Do not take Humalog or Humulin R (Regular Insulin) the day of your test. Check blood sugars prior to leaving the house. If able to eat breakfast prior to 3 hour fasting, you may take all medications, including your insulin, Do not worry if you miss your breakfast dose of insulin - start at your next meal. Patients who wear a continuous glucose monitor MUST remove the device prior to scanning.  IF YOU THINK YOU MAY BE PREGNANT, OR ARE NURSING PLEASE INFORM THE TECHNOLOGIST.  In preparation for your appointment, medication and supplies will be purchased.  Appointment availability is limited, so if you need to cancel or reschedule, please call the Radiology Department at [phone] [facility]) OR [phone] [facility])  24 hours in advance to avoid a cancellation fee of $100.00  What to Expect After you Arrive:  Once you arrive and check in for your appointment, you will be taken to a preparation room within the Radiology Department.  A technologist or Nurse will obtain your medical history, verify that you are correctly prepped for the exam, and explain the procedure.  Afterwards,  an IV will be started in your arm and electrodes will be placed on your skin for EKG monitoring during the stress portion of the exam. Then you will be escorted to the PET/CT scanner.  There, staff will get you positioned on the scanner and obtain a blood pressure and EKG.  During the exam, you will continue to be connected  to the EKG and blood pressure machines.  A small, safe amount of a radioactive tracer will be injected in your IV to obtain a series of pictures of your heart along with an injection of a stress agent.    After your Exam:  It is recommended that you eat a meal and drink a caffeinated beverage to counter act any effects of the stress agent.  Drink plenty of fluids for the remainder of the day and urinate frequently for the first couple of hours after the  exam.  Your doctor will inform you of your test results within 7-10 business days.  For more information and frequently asked questions, please visit our website : [email or web address]  For questions about your test or how to prepare for your test, please call: Cardiac Imaging Nurse Navigators Office: [phone]    Follow-Up: At [facility] you and your health needs are our priority.  As part of our continuing mission to provide you with exceptional heart care, we have created designated Provider Care Teams.  These Care Teams include your primary Cardiologist (physician) and Advanced Practice Providers (APPs -  Physician Assistants and Nurse Practitioners) who all work together to provide you with the care you need, when you need it.  We recommend signing up for the patient portal called "MyChart".  Sign up information is provided on this After Visit Summary.  MyChart is used to connect with patients for Virtual Visits (Telemedicine).  Patients are able to view lab/test results, encounter notes, upcoming appointments, etc.  Non-urgent messages can be sent to your provider as well.   To learn more about what you can do with MyChart, go to [email or web address].    Your next appointment:   2-3 month(s)  Provider:   Thomasene Ripple, DO    Other Instructions Goal Blood Pressure Less Than 140/90.

## 2023-07-14 ENCOUNTER — Telehealth: Payer: Self-pay | Admitting: Physician Assistant

## 2023-07-14 NOTE — Telephone Encounter (Signed)
72 y.o. male with a hx of coronary artery disease status post CABG in 2019, ischemic cardiomyopathy, status post bio aortic valve replacement 2019, hypertension, hyperlipidemia, diabetes, polycythemia vera.   I saw him on October 9th 2024.  Patient complained of increased dyspnea with exertion doing yard work, Designer, jewellery or digging in the garden.  We started Imdur 30 mg daily for possible anginal equivalent given CAD on left heart cath 2021 & to help with blood pressure control.   I saw him back on July 09, 2023.  Patient return to clinic with his wife. They complained of decreased stamina & dyspnea with moderate exertion. x 6 months.  Wife explained this is a tremendous change from 1 year ago.   They stated Imdur did not make a significant difference nor did the inhalers prescribed by his primary care.  Reviewed echo with imaging team.  With significant increase of gradients across the aortic valve from 1 year ago, this is most likely contributing to symptoms.  Will refer to the structural heart team/Dr. Excell Seltzer.  Will discontinue order for cardiac PET scan.  Structural heart team will order evaluation studies including CT/left heart cath.  Left message for patient on 11/15 & 11/18.    Juanda Crumble PA-C

## 2023-07-14 NOTE — Telephone Encounter (Signed)
I spoke with the patient in regards to structural heart evaluation.  I made the patient aware that his Echocardiogram has been further reviewed and significant increase of gradients across the aortic valve from 1 year ago noted, this is most likely contributing to symptoms per Juanda Crumble PA-C.  I will cancel order for cardiac PET scan and patient has been scheduled for evaluation with Dr Excell Seltzer on 07/28/2023. Pt agreed with plan and all questions answered.

## 2023-07-15 ENCOUNTER — Telehealth: Payer: Self-pay

## 2023-07-15 NOTE — Telephone Encounter (Addendum)
Results viewed by patient via Mychart.----- Message from Cannon Kettle sent at 07/11/2023  3:10 PM EST ----- Reviewed echo with Dr. Flora Lipps.  Patient S/p AVR (23mm [facility] Ease) in 2019 with increased dyspnea with exertion.    With gradient worsening over the past year and approaching , recommend referral to structural heart team for evaluation.

## 2023-07-20 DIAGNOSIS — J9611 Chronic respiratory failure with hypoxia: Secondary | ICD-10-CM | POA: Diagnosis not present

## 2023-07-22 ENCOUNTER — Other Ambulatory Visit: Payer: Self-pay | Admitting: Acute Care

## 2023-07-22 DIAGNOSIS — Z87891 Personal history of nicotine dependence: Secondary | ICD-10-CM

## 2023-07-22 DIAGNOSIS — Z122 Encounter for screening for malignant neoplasm of respiratory organs: Secondary | ICD-10-CM

## 2023-07-28 ENCOUNTER — Encounter: Payer: Self-pay | Admitting: Cardiovascular Disease

## 2023-07-28 ENCOUNTER — Ambulatory Visit: Payer: Medicare PPO | Attending: Cardiovascular Disease | Admitting: Cardiovascular Disease

## 2023-07-28 VITALS — BP 142/80 | HR 71 | Ht 76.0 in | Wt 175.2 lb

## 2023-07-28 DIAGNOSIS — T82857A Stenosis of cardiac prosthetic devices, implants and grafts, initial encounter: Secondary | ICD-10-CM | POA: Insufficient documentation

## 2023-07-28 DIAGNOSIS — T82857D Stenosis of cardiac prosthetic devices, implants and grafts, subsequent encounter: Secondary | ICD-10-CM | POA: Diagnosis not present

## 2023-07-28 NOTE — Assessment & Plan Note (Signed)
The patient has NYHA functional class II symptoms of fatigue and exertional dyspnea.  I suspect these are multifactorial in the context of his emphysema, chronic heart failure with preserved ejection fraction, and prosthetic aortic stenosis.  I reviewed his recent echo and compared this study with his previous.  His LVEF has improved.  However, his transvalvular gradients have increased as well.  His mean transaortic gradient is moderately elevated now at 26 mmHg, increased from 15 mmHg on his last study.  His murmur is not very impressive.  We spoke about the natural history of prosthetic aortic stenosis and he is demonstrating signs of valvular degeneration earlier than expected, now 5 years out from surgery.  We reviewed the natural history of this and he understands that prosthetic aortic stenosis tends to progress faster than native valve aortic stenosis.  I do not think he currently meets indication for consideration of redo valve replacement or transcatheter valve in valve replacement.  However, we will need to keep a close eye on him and I have recommended a repeat echocardiogram in 6 months with a follow-up visit at that time.  He understands that he will likely progress over the next 1 to 2 years and I suspect he will require valve in valve TAVR.  I reviewed his last cardiac catheterization films from 2021 when he was noted to have two-vessel CAD with moderate stenoses in the proximal LAD and proximal ramus branch.  Both lesions were interrogated with pressure wire analysis and were negative for ischemia.  The patient is not having any anginal symptoms.  We will plan to manage his coronary disease medically.  He and his wife were counseled about the cardinal symptoms of aortic stenosis and he knows to watch for worsening shortness of breath, fatigue, chest discomfort, lightheadedness, or syncope.  If his symptoms progress significantly, he will reach back out before his 8-month follow-up visit.

## 2023-07-28 NOTE — Patient Instructions (Signed)
Testing/Procedures: ECHO (in 6 months) Your physician has requested that you have an echocardiogram. Echocardiography is a painless test that uses sound waves to create images of your heart. It provides your doctor with information about the size and shape of your heart and how well your heart's chambers and valves are working. This procedure takes approximately one hour. There are no restrictions for this procedure. Please do NOT wear cologne, perfume, aftershave, or lotions (deodorant is allowed). Please arrive 15 minutes prior to your appointment time.  Please note: We ask at that you not bring children with you during ultrasound (echo/ vascular) testing. Due to room size and safety concerns, children are not allowed in the ultrasound rooms during exams. Our front office staff cannot provide observation of children in our lobby area while testing is being conducted. An adult accompanying a patient to their appointment will only be allowed in the ultrasound room at the discretion of the ultrasound technician under special circumstances. We apologize for any inconvenience.  Follow-Up: At [facility], you and your health needs are our priority.  As part of our continuing mission to provide you with exceptional heart care, we have created designated Provider Care Teams.  These Care Teams include your primary Cardiologist (physician) and Advanced Practice Providers (APPs -  Physician Assistants and Nurse Practitioners) who all work together to provide you with the care you need, when you need it.  Your next appointment:   6 month(s)  Provider:   Tonny Bollman, MD

## 2023-07-28 NOTE — Progress Notes (Signed)
Cardiology Office Note:    Date:  07/28/2023   ID:  [name], DOB 1950/12/18, MRN [id number]  PCP:  Wanda Plump, MD   [facility] Providers Cardiologist:  Thomasene Ripple, DO     Referring MD: Wanda Plump, MD   Chief Complaint  Patient presents with   Aortic Stenosis    History of Present Illness:    [name] is a 72 y.o. 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  The patient underwent CABG/AVR in 2019 when he was treated with a 23 mm [facility] Ease bovine pericardial valve and single-vessel CABG with a LIMA to LAD graft.  His presentation at that time included near syncope and pulmonary edema.  An echocardiogram showed critical aortic stenosis with a mean transvalvular gradient of 60 mmHg and valve area less than 0.5 cm.  EF was reduced at 35 to 40%.  The patient has complained of progressive fatigue and shortness of breath.  He underwent a recent echo study raising concern for prosthetic aortic stenosis.  I have reviewed serial echo studies today.  In 2022, an echo demonstrated an LVEF of 40 to 45% with a mean trans valvular gradient of 17 mmHg.  1 year later in 2023 his peak and mean gradients were 27 and 15 mmHg respectively with a calculated valve area of 1.48 cm and an LVEF of 50 to 55%.  A recent echo shows further improvement in LVEF now at 60 to 65%, but worsening gradients with peak and mean transvalvular gradients of 49 and 26 mmHg, respectively.  The calculated aortic valve area is 0.85 cm, dimensionless index is 0.23.  The patient denies lightheadedness or near syncope.  He denies chest pain or pressure.  His main complaint is shortness of breath and fatigue.  He is able to walk on level ground at a slow pace, but he cannot walk up a hill or do any yard work without stopping to rest because of his symptoms.  He denies any problems with his teeth and he has regular dental care.  He follows SBE prophylaxis as  recommended.   Current Medications: Current Meds  Medication Sig   acetaminophen (TYLENOL) 500 MG tablet Take 1,000 mg by mouth every 6 (six) hours as needed for moderate pain or headache.   amoxicillin (AMOXIL) 500 MG capsule Take 4 tablets by mouth 1 hour before dental procedure.   aspirin EC 325 MG EC tablet Take 1 tablet (325 mg total) by mouth daily.   budesonide-formoterol (SYMBICORT) 160-4.5 MCG/ACT inhaler Inhale 2 puffs into the lungs 2 (two) times daily.   cetirizine (ZYRTEC) 10 MG tablet Take 10 mg by mouth daily as needed for allergies.   Cyanocobalamin (VITAMIN B-12 PO) Take 1 tablet by mouth daily.   glucose blood (PRODIGY NO CODING BLOOD GLUC) test strip Use as instructed to check blood sugar twice a day.  DX  E11.40   hydrALAZINE (APRESOLINE) 50 MG tablet Take 1 tablet (50 mg total) by mouth 3 (three) times daily.   insulin aspart (NOVOLOG FLEXPEN) 100 UNIT/ML FlexPen Max daily 30 units   insulin glargine (LANTUS SOLOSTAR) 100 UNIT/ML Solostar Pen Inject 18 Units into the skin daily.   Insulin Pen Needle (BD PEN NEEDLE NANO 2ND GEN) 32G X 4 MM MISC Inject 1 Device into the skin in the morning, at noon, in the evening, and at bedtime.   isosorbide mononitrate (IMDUR) 30 MG 24 hr tablet Take 1 tablet (30 mg total)  by mouth daily.   metFORMIN (GLUCOPHAGE) 1000 MG tablet Take 1 tablet (1,000 mg total) by mouth 2 (two) times daily with a meal.   metoprolol succinate (TOPROL-XL) 50 MG 24 hr tablet Take 1 tablet (50 mg total) by mouth daily.   OXYGEN Inhale 2 L/min into the lungs continuous.   rosuvastatin (CRESTOR) 20 MG tablet TAKE 1 TABLET BY MOUTH EVERY DAY   sacubitril-valsartan (ENTRESTO) 97-103 MG TAKE 1 TABLET BY MOUTH TWICE A DAY   spironolactone (ALDACTONE) 25 MG tablet TAKE 1/2 TABLET BY MOUTH EVERY DAY     Allergies:   Farxiga [dapagliflozin], Lisinopril, Amlodipine besylate, and Hydrochlorothiazide   ROS:   Please see the history of present illness.    All other  systems reviewed and are negative.  EKGs/Labs/Other Studies Reviewed:    The following studies were reviewed today: Cardiac Studies & Procedures   CARDIAC CATHETERIZATION  CARDIAC CATHETERIZATION 05/10/2020  Narrative 1.  Moderate two-vessel coronary artery disease of the LAD and ramus intermedius branch, both with 60 to 70% lesions with negative pressure wire analysis 2.  Widely patent right coronary artery with minimal irregularity 3.  No significant transvalvular gradient across the bioprosthetic aortic valve and normal LVEDP  Recommendations: Medical therapy for cardiomyopathy and coronary artery disease.  Findings Coronary Findings Diagnostic  Dominance: Right  Left Anterior Descending Prox LAD lesion is 65% stenosed. DFR normal at 0.92  Ramus Intermedius Ramus lesion is 65% stenosed. DFR normal at 0.98  Left Circumflex Prox Cx lesion is 30% stenosed. Mid Cx lesion is 30% stenosed.  Right Coronary Artery Prox RCA to Mid RCA lesion is 20% stenosed.  Acute Marginal Branch Acute Mrg lesion is 30% stenosed.  Sequential LIMA LIMA Graft To 1st Diag, Mid LAD LIMA. The graft is atretic.  Patent but atretic LIMA graft  Intervention  No interventions have been documented.   CARDIAC CATHETERIZATION  CARDIAC CATHETERIZATION 01/13/2018  Narrative  Prox LAD lesion is 65% stenosed.  Ramus lesion is 30% stenosed.  Prox Cx lesion is 30% stenosed.  Mid Cx lesion is 30% stenosed.  Prox RCA to Mid RCA lesion is 20% stenosed.  Acute Mrg lesion is 30% stenosed.  There is severe aortic valve stenosis. There is mild (2+) aortic regurgitation.  Mild to moderate coronary obstructive disease with evidence for coronary calcification.  There is 60 to 70% stenosis of the LAD after the takeoff of the first diagonal vessel; 30% stenosis in the ramus intermediate vessel proximally; 30% proximal mid left circumflex stenoses, and 20% RCA stenosis proximally with 30% stenosis and an  RV marginal branch.  Very mild right heart catheterization elevation with mean PA pressure at 30 mm.  Severe critical aortic valve stenosis with a peak instantaneous gradient of 80 mmHg, peak to peak gradient of 56 mm, and mean gradient of 51 mmHg.  The aortic valve area 0.6 cm.  Supravalvular aortography reveals calcified aortic valve with markedly reduced excursion.  There is 1-2+ angiographic aortic insufficiency.  Aortic root is mildly enlarged.  Distal aortography not reveal any distal aortic aneurysm and revealed bilateral iliofemoral  Flow.  RECOMMENDATION: The patient will follow up with Dr. Bing Matter.  Catheterization confirmed severe aortic stenosis.  He will be referred for surgical evaluation by Dr. Bing Matter for consideration of AVR/CABG.  Findings Coronary Findings Diagnostic  Dominance: Right  Left Anterior Descending Prox LAD lesion is 65% stenosed.  Ramus Intermedius Ramus lesion is 30% stenosed.  Left Circumflex Prox Cx lesion is 30% stenosed. Mid Cx  lesion is 30% stenosed.  Right Coronary Artery Prox RCA to Mid RCA lesion is 20% stenosed.  Acute Marginal Branch Acute Mrg lesion is 30% stenosed.  Intervention  No interventions have been documented.     ECHOCARDIOGRAM  ECHOCARDIOGRAM COMPLETE 07/01/2023  Narrative ECHOCARDIOGRAM REPORT    Patient Name:   [name] Date of Exam: 07/01/2023 Medical Rec #:  [id number]          Height:       76.0 in Accession #:    [id number]         Weight:       172.2 lb Date of Birth:  May 25, 1951         BSA:          2.079 m Patient Age:    71 years           BP:           162/84 mmHg Patient Gender: M                  HR:           84 bpm. Exam Location:  [address]  Procedure: 2D Echo, Cardiac Doppler and Color Doppler  Indications:    Z95.2 S/p AVR  History:        Patient has prior history of Echocardiogram examinations, most recent 05/31/2022. Ischemic cardiomyopathy, CAD, Prior CABG, COPD,  S/p AVR (23mm [employer]), Arrythmias:Atrial Fibrillation; Risk Factors:Hypertension, Dyslipidemia and Diabetes.  Sonographer:    Samule Ohm RDCS Referring Phys: [id number] JENNIFER K LAMBERT  IMPRESSIONS   1. Left ventricular ejection fraction, by estimation, is 60 to 65%. The left ventricle has normal function. The left ventricle has no regional wall motion abnormalities. Left ventricular diastolic parameters were normal. 2. Right ventricular systolic function is normal. The right ventricular size is normal. 3. The mitral valve is normal in structure. Trivial mitral valve regurgitation. 4. S/p AVR (23 mm [employer] Ease prosthesis; procedure date 03/13/18 ) Peak and mean gradients through the valve are 49 and 26 mm Hg respectively. AVA (VTI) is 0.85 cm2. DI is 0.23 Acceleration time is greater than 100 msec. Compared to echo from Oct 2023, mean gradient is increased (15 to 26 mm Hg) Suspicious for possible stenosis. Would recommend other imaging modality to define.. The aortic valve has been repaired/replaced. Aortic valve regurgitation is not visualized.  FINDINGS Left Ventricle: Left ventricular ejection fraction, by estimation, is 60 to 65%. The left ventricle has normal function. The left ventricle has no regional wall motion abnormalities. The left ventricular internal cavity size was normal in size. There is no left ventricular hypertrophy. Left ventricular diastolic parameters were normal.  Right Ventricle: The right ventricular size is normal. Right vetricular wall thickness was not assessed. Right ventricular systolic function is normal.  Left Atrium: Left atrial size was normal in size.  Right Atrium: Right atrial size was normal in size.  Pericardium: There is no evidence of pericardial effusion.  Mitral Valve: The mitral valve is normal in structure. Trivial mitral valve regurgitation.  Tricuspid Valve: The tricuspid valve is normal in structure. Tricuspid  valve regurgitation is trivial.  Aortic Valve: S/p AVR (23 mm [employer] Ease prosthesis; procedure date 03/13/18 ) Peak and mean gradients through the valve are 49 and 26 mm Hg respectively. AVA (VTI) is 0.85 cm2. DI is 0.23 Acceleration time is greater than 100 msec. Compared to echo from Oct 2023, mean gradient is increased (15 to  26 mm Hg) Suspicious for possible stenosis. Would recommend other imaging modality to define. The aortic valve has been repaired/replaced. Aortic valve regurgitation is not visualized. Aortic regurgitation PHT measures 57 msec. Aortic valve mean gradient measures 26.0 mmHg. Aortic valve peak gradient measures 47.7 mmHg. Aortic valve area, by VTI measures 0.86 cm.  Pulmonic Valve: The pulmonic valve was not well visualized. Pulmonic valve regurgitation is not visualized.  Aorta: The aortic root and ascending aorta are structurally normal, with no evidence of dilitation.  IAS/Shunts: No atrial level shunt detected by color flow Doppler.   LEFT VENTRICLE PLAX 2D LVIDd:         4.70 cm   Diastology LVIDs:         3.30 cm   LV e' medial:    9.91 cm/s LV PW:         0.90 cm   LV E/e' medial:  8.6 LV IVS:        0.90 cm   LV e' lateral:   10.80 cm/s LVOT diam:     2.20 cm   LV E/e' lateral: 7.9 LV SV:         66 LV SV Index:   32 LVOT Area:     3.80 cm   RIGHT VENTRICLE             IVC RV S prime:     12.60 cm/s  IVC diam: 1.30 cm TAPSE (M-mode): 1.4 cm  LEFT ATRIUM             Index        RIGHT ATRIUM           Index LA diam:        3.30 cm 1.59 cm/m   RA Pressure: 3.00 mmHg LA Vol (A2C):   42.4 ml 20.39 ml/m  RA Area:     15.10 cm LA Vol (A4C):   37.9 ml 18.23 ml/m  RA Volume:   45.90 ml  22.08 ml/m LA Biplane Vol: 42.0 ml 20.20 ml/m AORTIC VALVE AV Area (Vmax):    0.84 cm AV Area (Vmean):   0.85 cm AV Area (VTI):     0.86 cm AV Vmax:           345.33 cm/s AV Vmean:          236.000 cm/s AV VTI:            0.767 m AV Peak Grad:       47.7 mmHg AV Mean Grad:      26.0 mmHg LVOT Vmax:         76.33 cm/s LVOT Vmean:        52.500 cm/s LVOT VTI:          0.174 m LVOT/AV VTI ratio: 0.23 AI PHT:            57 msec  AORTA Ao Root diam: 3.50 cm Ao Asc diam:  3.40 cm  MITRAL VALVE               TRICUSPID VALVE MV Area (PHT): 3.29 cm    Estimated RAP:  3.00 mmHg MV Decel Time: 230 msec MV E velocity: 85.22 cm/s  SHUNTS MV A velocity: 91.62 cm/s  Systemic VTI:  0.17 m MV E/A ratio:  0.93        Systemic Diam: 2.20 cm  Dietrich Pates MD Electronically signed by Dietrich Pates MD Signature Date/Time: 07/01/2023/3:46:31 PM    Final   TEE  ECHO TEE 03/04/2018  Interpretation Summary  Aortic valve: The valve is bicuspid. Severe valve thickening present. Severe valve calcification present. Severely decreased leaflet separation. Critical stenosis. Mild to moderate regurgitation.  Right ventricle: Normal cavity size, wall thickness and ejection fraction.  Tricuspid valve: Mild regurgitation. The tricuspid valve regurgitation jet is central.   MONITORS  LONG TERM MONITOR (3-14 DAYS) 07/30/2018  Narrative A 3-day ZIO monitor was performed in response to a phone call from cardiac rehabilitation frequent PVCs.  The rhythm throughout is sinus minimum average and maximum heart rates of 60, 81 and 112 bpm  There were no pauses of 3 seconds or greater, no episodes of sinus node or AV block  Ventricular ectopy was rare with isolated PVCs and couplets however episodically the patient had episodes of ventricular trigeminy.  Supraventricular ectopy was rare with isolated APCs and one asymptomatic 5 beat run of atrial premature contractions.  There were no episodes of atrial fibrillation or flutter.  There were no triggered or diary events  Conclusion, although the burden of ventricular ectopy is low with rare PVCs there were intermittent episodes of ventricular trigeminy.    CARDIAC MRI  MR CARDIAC MORPHOLOGY W WO CONTRAST  08/07/2020  Narrative CLINICAL DATA:  Cardiomyopathy  EXAM: CARDIAC MRI  TECHNIQUE: The patient was scanned on a 1.5 Tesla GE magnet. A dedicated cardiac coil was used. Functional imaging was done using Fiesta sequences. 2,3, and 4 chamber views were done to assess for RWMA's. Modified Simpson's rule using a short axis stack was used to calculate an ejection fraction on a dedicated work [employer]. The patient received 10 cc of Gadavist. After 10 minutes inversion recovery sequences were used to assess for infiltration and scar tissue.  CONTRAST:  Gadavist 10 cc  FINDINGS: Limited images of the lung fields showed no gross abnormalities. The patient is status post sternotomy.  Mildly dilated left ventricle with mild LV hypertrophy. Septal dyskinesis, EF calculated at 25% (lower than it looks visually, likely due to dyskinesis). Normal right ventricular size with mildly decreased systolic function, EF 37%. Normal left and right atrial sizes. Bioprosthetic aortic valve with minimal regurgitation noted. No significant mitral regurgitation noted.  Difficult/poor quality delayed enhancement images. Possible small area of subepicardial late gadolinium enhancement (LGE) in the mid inferoseptal wall at the RV insertion site.  Measurements:  LVEDV 176 mL LVSV 45 mL LVEF 25%  RVEDV 132 mL RVSV 48 mL RVEF 37%  IMPRESSION: 1. Mildly dilated LV with mild LV hypertrophy. Dyskinetic septal wall with EF 25%.  2.  Normal RV size with mildly decreased systolic function, EF 37%.  3. Poor delayed enhancement images. No coronary disease-type LGE noted. Possible inferoseptal wall RV insertion site LGE, nonspecific and suggestive of volume/pressure overload.  Dalton Mclean   Electronically Signed By: Marca Ancona M.D. On: 08/07/2020 17:52         EKG:   EKG Interpretation Date/Time:  Monday July 28 2023 11:37:46 EST Ventricular Rate:  71 PR  Interval:  170 QRS Duration:  120 QT Interval:  396 QTC Calculation: 430 R Axis:   76  Text Interpretation: Normal sinus rhythm Left ventricular hypertrophy with QRS widening and repolarization abnormality ( Sokolow-Lyon ) When compared with ECG of 04-Jun-2023 10:40, No significant change was found Confirmed by Tonny Bollman [phone]) on 07/28/2023 11:52:52 AM    Recent Labs: 03/31/2023: ALT 6; BUN 10; Creatinine 0.95; Hemoglobin 12.3; Platelet Count 378; Potassium 4.3; Sodium 139  Recent Lipid Panel  Component Value Date/Time   CHOL 124 06/18/2023 1146   TRIG 83.0 06/18/2023 1146   HDL 44.10 06/18/2023 1146   CHOLHDL 3 06/18/2023 1146   VLDL 16.6 06/18/2023 1146   LDLCALC 63 06/18/2023 1146   LDLDIRECT 177.6 10/07/2007 1001          Physical Exam:    VS:  BP (!) 142/80   Pulse 71   Ht 6\' 4"  (1.93 m)   Wt 175 lb 3.2 oz (79.5 kg)   SpO2 94%   BMI 21.33 kg/m     Wt Readings from Last 3 Encounters:  07/28/23 175 lb 3.2 oz (79.5 kg)  07/09/23 177 lb 12.8 oz (80.6 kg)  06/18/23 172 lb 4 oz (78.1 kg)     GEN:  Well nourished, well developed thin gentleman in no acute distress HEENT: Normal NECK: No JVD; No carotid bruits LYMPHATICS: No lymphadenopathy CARDIAC: RRR, 2/6 systolic murmur at the right upper sternal border, somewhat distant heart sounds RESPIRATORY:  Clear to auscultation without rales, wheezing or rhonchi  ABDOMEN: Soft, non-tender, non-distended MUSCULOSKELETAL:  No edema; No deformity  SKIN: Warm and dry NEUROLOGIC:  Alert and oriented x 3 PSYCHIATRIC:  Normal affect   Assessment & Plan Stenosis of prosthetic aortic valve, initial encounter The patient has NYHA functional class II symptoms of fatigue and exertional dyspnea.  I suspect these are multifactorial in the context of his emphysema, chronic heart failure with preserved ejection fraction, and prosthetic aortic stenosis.  I reviewed his recent echo and compared this study with his previous.  His  LVEF has improved.  However, his transvalvular gradients have increased as well.  His mean transaortic gradient is moderately elevated now at 26 mmHg, increased from 15 mmHg on his last study.  His murmur is not very impressive.  We spoke about the natural history of prosthetic aortic stenosis and he is demonstrating signs of valvular degeneration earlier than expected, now 5 years out from surgery.  We reviewed the natural history of this and he understands that prosthetic aortic stenosis tends to progress faster than native valve aortic stenosis.  I do not think he currently meets indication for consideration of redo valve replacement or transcatheter valve in valve replacement.  However, we will need to keep a close eye on him and I have recommended a repeat echocardiogram in 6 months with a follow-up visit at that time.  He understands that he will likely progress over the next 1 to 2 years and I suspect he will require valve in valve TAVR.  I reviewed his last cardiac catheterization films from 2021 when he was noted to have two-vessel CAD with moderate stenoses in the proximal LAD and proximal ramus branch.  Both lesions were interrogated with pressure wire analysis and were negative for ischemia.  The patient is not having any anginal symptoms.  We will plan to manage his coronary disease medically.  He and his wife were counseled about the cardinal symptoms of aortic stenosis and he knows to watch for worsening shortness of breath, fatigue, chest discomfort, lightheadedness, or syncope.  If his symptoms progress significantly, he will reach back out before his 39-month follow-up visit.            Medication Adjustments/Labs and Tests Ordered: Current medicines are reviewed at length with the patient today.  Concerns regarding medicines are outlined above.  Orders Placed This Encounter  Procedures   EKG 12-Lead   ECHOCARDIOGRAM COMPLETE   No orders of the defined types  were placed in this  encounter.   Patient Instructions  Testing/Procedures: ECHO (in 6 months) Your physician has requested that you have an echocardiogram. Echocardiography is a painless test that uses sound waves to create images of your heart. It provides your doctor with information about the size and shape of your heart and how well your heart's chambers and valves are working. This procedure takes approximately one hour. There are no restrictions for this procedure. Please do NOT wear cologne, perfume, aftershave, or lotions (deodorant is allowed). Please arrive 15 minutes prior to your appointment time.  Please note: We ask at that you not bring children with you during ultrasound (echo/ vascular) testing. Due to room size and safety concerns, children are not allowed in the ultrasound rooms during exams. Our front office staff cannot provide observation of children in our lobby area while testing is being conducted. An adult accompanying a patient to their appointment will only be allowed in the ultrasound room at the discretion of the ultrasound technician under special circumstances. We apologize for any inconvenience.  Follow-Up: At [facility], you and your health needs are our priority.  As part of our continuing mission to provide you with exceptional heart care, we have created designated Provider Care Teams.  These Care Teams include your primary Cardiologist (physician) and Advanced Practice Providers (APPs -  Physician Assistants and Nurse Practitioners) who all work together to provide you with the care you need, when you need it.  Your next appointment:   6 month(s)  Provider:   Tonny Bollman, MD       Signed, Tonny Bollman, MD  07/28/2023 12:51 PM    [facility]

## 2023-07-29 ENCOUNTER — Telehealth: Payer: Self-pay

## 2023-07-29 NOTE — Telephone Encounter (Signed)
Oxygen re-cert signed and faxed back to [employer] at [phone]. Form sent for scanning.

## 2023-07-31 ENCOUNTER — Telehealth: Payer: Self-pay | Admitting: Internal Medicine

## 2023-07-31 NOTE — Telephone Encounter (Signed)
Already received- see chart.

## 2023-07-31 NOTE — Telephone Encounter (Signed)
A portable was ordered for Pt back in April 2024- was confirmed by [employer] that Pt received it, form received earlier this week was just for re-certification as required by Medicare. Form has been sent for scanning

## 2023-07-31 NOTE — Telephone Encounter (Signed)
[name] (spouse DPR Ok) called stating that pt was looking to get an order started for a portable O2 compressor to wear when he is out of the house.

## 2023-08-04 ENCOUNTER — Encounter: Payer: Self-pay | Admitting: Family

## 2023-08-15 ENCOUNTER — Other Ambulatory Visit: Payer: Self-pay | Admitting: Physician Assistant

## 2023-08-19 DIAGNOSIS — J9611 Chronic respiratory failure with hypoxia: Secondary | ICD-10-CM | POA: Diagnosis not present

## 2023-08-28 ENCOUNTER — Ambulatory Visit
Admission: RE | Admit: 2023-08-28 | Discharge: 2023-08-28 | Disposition: A | Discharge status: Home / Self Care | Payer: Medicare PPO | Source: Ambulatory Visit | Attending: Acute Care | Admitting: Acute Care

## 2023-08-28 DIAGNOSIS — Z87891 Personal history of nicotine dependence: Secondary | ICD-10-CM | POA: Diagnosis not present

## 2023-08-28 DIAGNOSIS — Z122 Encounter for screening for malignant neoplasm of respiratory organs: Secondary | ICD-10-CM | POA: Diagnosis not present

## 2023-09-09 ENCOUNTER — Other Ambulatory Visit: Payer: Self-pay

## 2023-09-09 DIAGNOSIS — Z122 Encounter for screening for malignant neoplasm of respiratory organs: Secondary | ICD-10-CM

## 2023-09-09 DIAGNOSIS — Z87891 Personal history of nicotine dependence: Secondary | ICD-10-CM

## 2023-09-14 ENCOUNTER — Other Ambulatory Visit: Payer: Self-pay | Admitting: Physician Assistant

## 2023-09-19 DIAGNOSIS — J9611 Chronic respiratory failure with hypoxia: Secondary | ICD-10-CM | POA: Diagnosis not present

## 2023-09-23 ENCOUNTER — Other Ambulatory Visit: Payer: Self-pay | Admitting: Internal Medicine

## 2023-09-25 DIAGNOSIS — E119 Type 2 diabetes mellitus without complications: Secondary | ICD-10-CM | POA: Diagnosis not present

## 2023-09-25 LAB — HM DIABETES EYE EXAM

## 2023-09-29 ENCOUNTER — Telehealth: Payer: Self-pay

## 2023-09-29 NOTE — Telephone Encounter (Signed)
Oxygen re-cert orders signed and faxed back to [employer] at [phone]. Form sent for scanning.

## 2023-10-01 ENCOUNTER — Other Ambulatory Visit: Payer: Self-pay | Admitting: Physician Assistant

## 2023-10-01 ENCOUNTER — Inpatient Hospital Stay: Payer: Medicare PPO | Attending: Hematology & Oncology

## 2023-10-01 ENCOUNTER — Inpatient Hospital Stay (HOSPITAL_BASED_OUTPATIENT_CLINIC_OR_DEPARTMENT_OTHER): Payer: Medicare PPO | Admitting: Family

## 2023-10-01 VITALS — BP 130/70 | HR 81 | Temp 97.8°F | Resp 17 | Ht 76.0 in | Wt 175.0 lb

## 2023-10-01 DIAGNOSIS — D500 Iron deficiency anemia secondary to blood loss (chronic): Secondary | ICD-10-CM

## 2023-10-01 DIAGNOSIS — Z7982 Long term (current) use of aspirin: Secondary | ICD-10-CM | POA: Diagnosis not present

## 2023-10-01 DIAGNOSIS — D45 Polycythemia vera: Secondary | ICD-10-CM

## 2023-10-01 LAB — CBC WITH DIFFERENTIAL (CANCER CENTER ONLY)
Abs Immature Granulocytes: 0.05 10*3/uL (ref 0.00–0.07)
Basophils Absolute: 0.1 10*3/uL (ref 0.0–0.1)
Basophils Relative: 1 %
Eosinophils Absolute: 0.1 10*3/uL (ref 0.0–0.5)
Eosinophils Relative: 2 %
HCT: 40.7 % (ref 39.0–52.0)
Hemoglobin: 11.9 g/dL — ABNORMAL LOW (ref 13.0–17.0)
Immature Granulocytes: 1 %
Lymphocytes Relative: 17 %
Lymphs Abs: 1.2 10*3/uL (ref 0.7–4.0)
MCH: 17.1 pg — ABNORMAL LOW (ref 26.0–34.0)
MCHC: 29.2 g/dL — ABNORMAL LOW (ref 30.0–36.0)
MCV: 58.6 fL — ABNORMAL LOW (ref 80.0–100.0)
Monocytes Absolute: 0.7 10*3/uL (ref 0.1–1.0)
Monocytes Relative: 10 %
Neutro Abs: 5.2 10*3/uL (ref 1.7–7.7)
Neutrophils Relative %: 69 %
Platelet Count: 323 10*3/uL (ref 150–400)
RBC: 6.95 MIL/uL — ABNORMAL HIGH (ref 4.22–5.81)
RDW: 25.1 % — ABNORMAL HIGH (ref 11.5–15.5)
Smear Review: ADEQUATE
WBC Count: 7.4 10*3/uL (ref 4.0–10.5)
nRBC: 0 % (ref 0.0–0.2)

## 2023-10-01 LAB — CMP (CANCER CENTER ONLY)
ALT: 6 U/L (ref 0–44)
AST: 12 U/L — ABNORMAL LOW (ref 15–41)
Albumin: 4.2 g/dL (ref 3.5–5.0)
Alkaline Phosphatase: 44 U/L (ref 38–126)
Anion gap: 8 (ref 5–15)
BUN: 12 mg/dL (ref 8–23)
CO2: 26 mmol/L (ref 22–32)
Calcium: 9.6 mg/dL (ref 8.9–10.3)
Chloride: 106 mmol/L (ref 98–111)
Creatinine: 1.13 mg/dL (ref 0.61–1.24)
GFR, Estimated: >60 mL/min (ref >60)
Glucose, Bld: 159 mg/dL — ABNORMAL HIGH (ref 70–99)
Potassium: 4.6 mmol/L (ref 3.5–5.1)
Sodium: 140 mmol/L (ref 135–145)
Total Bilirubin: 0.6 mg/dL (ref 0.0–1.2)
Total Protein: 7.1 g/dL (ref 6.5–8.1)

## 2023-10-01 LAB — IRON AND IRON BINDING CAPACITY (CC-WL,HP ONLY)
Iron: 23 ug/dL — ABNORMAL LOW (ref 45–182)
Saturation Ratios: 6 % — ABNORMAL LOW (ref 17.9–39.5)
TIBC: 416 ug/dL (ref 250–450)
UIBC: 393 ug/dL — ABNORMAL HIGH (ref 117–376)

## 2023-10-01 LAB — FERRITIN: Ferritin: 6 ng/mL — ABNORMAL LOW (ref 24–336)

## 2023-10-01 NOTE — Progress Notes (Signed)
 Hematology and Oncology Follow Up Visit  [name] [id number] [birth year] 73 y.o. 10/01/2023   Principle Diagnosis:  Polycythemia vera- JAK2 (+)   Current Therapy:        Phlebotomy to maintain hematocrit less than 45% Aspirin  325 mg PO daily - on full dose for cardiac issues   Interim History:  [name] is here today for follow-up. His Hct is stable at 40%. No phlebotomy needed.  He is doing well and has no complaints at this time.  He notes occasional SOB with over exertion.  He states that he occasionally has dark tarry stools. No bright red blood or maroon stools noted.  He is taking one full dose aspirin  daily with food.  No abnormal bruising, no petechiae.  No fever, chills, n/v, cough, rash, dizziness, chest pain, palpitations, abdominal pain or changes in bowel or bladder habits.  No swelling, tenderness, numbness or tingling in his extremities.  No falls or syncope reported.  Appetite and hydration have been good. Weight is stable at 175 lbs.   ECOG Performance Status: 0 - Asymptomatic  Medications:  Allergies as of 10/01/2023       Reactions   Farxiga  [dapagliflozin ] Rash, Other (See Comments)   swollen penis   Lisinopril  Other (See Comments)   Hyperkalemia   Amlodipine Besylate Other (See Comments)   REACTION: ? caused left axillary nodules/chest flutter   Hydrochlorothiazide Hives        Medication List        Accurate as of October 01, 2023 12:02 PM. If you have any questions, ask your nurse or doctor.          acetaminophen  500 MG tablet Commonly known as: TYLENOL  Take 1,000 mg by mouth every 6 (six) hours as needed for moderate pain or headache.   amoxicillin  500 MG capsule Commonly known as: AMOXIL  Take 4 tablets by mouth 1 hour before dental procedure.   aspirin  EC 325 MG tablet Take 1 tablet (325 mg total) by mouth daily.   BD Pen Needle Nano 2nd Gen 32G X 4 MM Misc Generic drug: Insulin  Pen Needle Inject 1 Device into  the skin in the morning, at noon, in the evening, and at bedtime.   budesonide -formoterol  160-4.5 MCG/ACT inhaler Commonly known as: SYMBICORT  Inhale 2 puffs into the lungs 2 (two) times daily.   cetirizine  10 MG tablet Commonly known as: ZYRTEC  Take 10 mg by mouth daily as needed for allergies.   Entresto  97-103 MG Generic drug: sacubitril -valsartan  TAKE 1 TABLET BY MOUTH TWICE A DAY   glucose blood test strip Commonly known as: Prodigy No Coding Blood Gluc Use as instructed to check blood sugar twice a day.  DX  E11.40   hydrALAZINE  50 MG tablet Commonly known as: APRESOLINE  Take 1 tablet (50 mg total) by mouth 3 (three) times daily.   isosorbide  mononitrate 30 MG 24 hr tablet Commonly known as: IMDUR  TAKE 1 TABLET BY MOUTH EVERY DAY   Lantus  SoloStar 100 UNIT/ML Solostar Pen Generic drug: insulin  glargine Inject 18 Units into the skin daily.   metFORMIN  1000 MG tablet Commonly known as: GLUCOPHAGE  Take 1 tablet (1,000 mg total) by mouth 2 (two) times daily with a meal.   metoprolol  succinate 50 MG 24 hr tablet Commonly known as: TOPROL -XL TAKE 1 TABLET BY MOUTH EVERY DAY   NovoLOG  FlexPen 100 UNIT/ML FlexPen Generic drug: insulin  aspart Max daily 30 units   OXYGEN  Inhale 2 L/min into the lungs continuous.   rosuvastatin  20  MG tablet Commonly known as: CRESTOR  Take 1 tablet (20 mg total) by mouth daily.   spironolactone  25 MG tablet Commonly known as: ALDACTONE  TAKE 1/2 TABLET BY MOUTH EVERY DAY   VITAMIN B-12 PO Take 1 tablet by mouth daily.        Allergies:  Allergies  Allergen Reactions   Farxiga  [Dapagliflozin ] Rash and Other (See Comments)    swollen penis   Lisinopril  Other (See Comments)    Hyperkalemia    Amlodipine Besylate Other (See Comments)    REACTION: ? caused left axillary nodules/chest flutter   Hydrochlorothiazide Hives    Past Medical History, Surgical history, Social history, and Family History were reviewed and  updated.  Review of Systems: All other 10 point review of systems is negative.   Physical Exam:  vitals were not taken for this visit.   Wt Readings from Last 3 Encounters:  07/28/23 175 lb 3.2 oz (79.5 kg)  07/09/23 177 lb 12.8 oz (80.6 kg)  06/18/23 172 lb 4 oz (78.1 kg)    Ocular: Sclerae unicteric, pupils equal, round and reactive to light Ear-nose-throat: Oropharynx clear, dentition fair Lymphatic: No cervical or supraclavicular adenopathy Lungs no rales or rhonchi, good excursion bilaterally Heart regular rate and rhythm, no murmur appreciated Abd soft, nontender, positive bowel sounds MSK no focal spinal tenderness, no joint edema Neuro: non-focal, well-oriented, appropriate affect Breasts: Deferred   Lab Results  Component Value Date   WBC 7.0 03/31/2023   HGB 12.3 (L) 03/31/2023   HCT 42.4 03/31/2023   MCV 61.1 (L) 03/31/2023   PLT 378 03/31/2023   Lab Results  Component Value Date   FERRITIN 6 (L) 03/31/2023   IRON 29 (L) 03/31/2023   TIBC 423 03/31/2023   UIBC 394 (H) 03/31/2023   IRONPCTSAT 7 (L) 03/31/2023   Lab Results  Component Value Date   RETICCTPCT 1.1 08/30/2021   RBC 6.94 (H) 03/31/2023   No results found for: KPAFRELGTCHN, LAMBDASER, KAPLAMBRATIO No results found for: IGGSERUM, IGA, IGMSERUM No results found for: STEPHANY CARLOTA BENSON MARKEL EARLA JOANNIE DOC VICK, SPEI   Chemistry      Component Value Date/Time   NA 139 03/31/2023 1302   NA 141 11/12/2021 0959   K 4.3 03/31/2023 1302   CL 104 03/31/2023 1302   CO2 28 03/31/2023 1302   BUN 10 03/31/2023 1302   BUN 12 11/12/2021 0959   CREATININE 0.95 03/31/2023 1302   CREATININE 0.84 11/30/2013 0856      Component Value Date/Time   CALCIUM  9.0 03/31/2023 1302   ALKPHOS 41 03/31/2023 1302   AST 13 (L) 03/31/2023 1302   ALT 6 03/31/2023 1302   BILITOT 0.5 03/31/2023 1302       Impression and Plan: [name] is a very pleasant 73  yo African American gentleman with polycythemia vera, JAK 2 positive.  No phlebotomy needed this visit, Hct 40.7%. Iron studies pending.  Follow-up in 6 months.  Lauraine Pepper, NP 2/5/202512:02 PM

## 2023-10-02 ENCOUNTER — Ambulatory Visit: Payer: Medicare PPO | Attending: Cardiology | Admitting: Cardiology

## 2023-10-02 ENCOUNTER — Encounter: Payer: Self-pay | Admitting: Cardiology

## 2023-10-02 VITALS — BP 138/78 | HR 75 | Ht 76.0 in | Wt 178.0 lb

## 2023-10-02 DIAGNOSIS — I10 Essential (primary) hypertension: Secondary | ICD-10-CM

## 2023-10-02 DIAGNOSIS — Z79899 Other long term (current) drug therapy: Secondary | ICD-10-CM

## 2023-10-02 DIAGNOSIS — I480 Paroxysmal atrial fibrillation: Secondary | ICD-10-CM

## 2023-10-02 DIAGNOSIS — Z953 Presence of xenogenic heart valve: Secondary | ICD-10-CM | POA: Diagnosis not present

## 2023-10-02 DIAGNOSIS — Z951 Presence of aortocoronary bypass graft: Secondary | ICD-10-CM

## 2023-10-02 DIAGNOSIS — I2510 Atherosclerotic heart disease of native coronary artery without angina pectoris: Secondary | ICD-10-CM

## 2023-10-02 DIAGNOSIS — E782 Mixed hyperlipidemia: Secondary | ICD-10-CM | POA: Diagnosis not present

## 2023-10-02 LAB — COMPREHENSIVE METABOLIC PANEL
ALT: 8 [IU]/L (ref 0–44)
AST: 14 [IU]/L (ref 0–40)
Albumin: 4.2 g/dL (ref 3.8–4.8)
Alkaline Phosphatase: 53 [IU]/L (ref 44–121)
BUN/Creatinine Ratio: 12 (ref 10–24)
BUN: 13 mg/dL (ref 8–27)
Bilirubin Total: 0.5 mg/dL (ref 0.0–1.2)
CO2: 23 mmol/L (ref 20–29)
Calcium: 9.7 mg/dL (ref 8.6–10.2)
Chloride: 104 mmol/L (ref 96–106)
Creatinine, Ser: 1.1 mg/dL (ref 0.76–1.27)
Globulin, Total: 2.9 g/dL (ref 1.5–4.5)
Glucose: 81 mg/dL (ref 70–99)
Potassium: 5.5 mmol/L — ABNORMAL HIGH (ref 3.5–5.2)
Sodium: 140 mmol/L (ref 134–144)
Total Protein: 7.1 g/dL (ref 6.0–8.5)
eGFR: 71 mL/min/{1.73_m2} (ref >59)

## 2023-10-02 LAB — LIPID PANEL
Chol/HDL Ratio: 2.6 {ratio} (ref 0.0–5.0)
Cholesterol, Total: 118 mg/dL (ref 100–199)
HDL: 46 mg/dL (ref >39)
LDL Chol Calc (NIH): 60 mg/dL (ref 0–99)
Triglycerides: 54 mg/dL (ref 0–149)
VLDL Cholesterol Cal: 12 mg/dL (ref 5–40)

## 2023-10-02 LAB — MAGNESIUM: Magnesium: 2.1 mg/dL (ref 1.6–2.3)

## 2023-10-02 NOTE — Progress Notes (Signed)
 Cardiology Office Note:    Date:  10/04/2023   ID:  [name], DOB December 15, 1950, MRN [id number]  PCP:  Amon Aloysius BRAVO, MD  Cardiologist:  Dub Huntsman, DO  Electrophysiologist:  None   Referring MD: Amon Aloysius BRAVO, MD    I am doing fine  History of Present Illness:    [name] is a 73 y.o. male with a hx of  coronary artery disease status post CABG in 2019, ischemic cardiomyopathy most recent EF on March 27, 2020 at which time his EF was reported to be 25 to 30%, Status post aortic valve replacement with a 23 Edwards valve also in July 2019, hypertension hyperlipidemia and diabetes.  Polycythemia vera and is undergoing phlebotomy.   Since I saw the patient he has been doing well from a CV standpoint.   Past Medical History:  Diagnosis Date   Abnormal myocardial perfusion study 2006   EF 44% ? inferoseptal ischemia. no cath done   Allergy    Benign neoplasm of cecum    Breast mass, right 03/2008   Cardiomyopathy (HCC) 07/20/2018   Ejection fraction 4045% in September 2019   Centrilobular emphysema (HCC) 08/25/2018   02/04/2018-high-res CT- no evidence of interstitial lung disease, severe centrilobular emphysema and mild diffuse bronchial wall thickening suggesting COPD, no calcified pleural plaques no pleural effusion, mild smooth bilateral pleural thickening which is nonspecific  02/04/2018-pulmonary function test- FVC 3.37 (71% predicted), postbronchodilator ratio 50, postbronchodilator FEV1 54, mid flow reve   CHF (congestive heart failure) (HCC)    COLONIC POLYPS, HX OF 03/18/2007   Qualifier: Diagnosis of  By: [name]    Contact dermatitis 01/31/2013   COPD (chronic obstructive pulmonary disease) (HCC)    dx'd 12/2017   COPD GOLD II A  08/25/2018   02/04/2018-high-res CT- no evidence of interstitial lung disease, severe centrilobular emphysema and mild diffuse bronchial wall thickening suggesting COPD, no calcified pleural plaques no pleural effusion,  mild smooth bilateral pleural thickening which is nonspecific  02/04/2018-pulmonary function test- FVC 3.37 (71% predicted), postbronchodilator ratio 50, postbronchodilator FEV1 54, mid flow reve   Coronary artery disease    Critical aortic valve stenosis 01/12/2018   Calculated valve area less than 0.5 cm mean gradient more than 60 mm of mercury   DEGENERATIVE JOINT DISEASE, BACK 03/18/2007   Qualifier: Diagnosis of  By: [name]    Depressed left ventricular ejection fraction 03/28/2020   Diabetes mellitus (HCC) 07/25/2020   Dyslipidemia 04/20/2020   Dyspnea on exertion 01/12/2018   Emphysema of lung (HCC)    ERECTILE DYSFUNCTION, ORGANIC 02/10/2009   Qualifier: Diagnosis of  By: [name]    Former cigarette smoker 10/12/2018   Quit in 2012 41-pack-year smoking history   Goals of care, counseling/discussion 08/24/2020   H/O aortic valve replacement 05/05/2020   Heart murmur    HEMORRHOIDS, INTERNAL 03/18/2007   Qualifier: Diagnosis of  By: [name]    HTN (hypertension) 10/21/2007        Hyperlipemia 10/21/2007   Qualifier: Diagnosis of  By: [name]    Hypertension    Hypoglycemia due to insulin  03/26/2020   Low back pain    MYOCARDIAL PERFUSION SCAN, WITH STRESS TEST, ABNORMAL 08/11/2008   Qualifier: Diagnosis of  By: Delford, MD, CODY Maude Dunnings    Nocturnal hypoxemia 08/25/2018   04/09/18 >>> ONO - telephone note>>> Patient's ONO on room air showed that he was  less than 88% for more than an hour. Patient needs to be on 2 L at HS.  10/06/2018-overnight oximetry-entire duration was 4 hours and 23 minutes, SPO2 less than 88% for 25 minutes and 32 seconds     Oxygen  deficiency    uses 2L at night   Paroxysmal atrial fibrillation (HCC) 10/12/2018   PCP NOTES >>>>>>>>>>>>>>>>> 05/14/2020   Polycythemia vera (HCC) 08/04/2020   S/P AVR 03/04/2018   AVR 23 mm Edwards Magna Valve - bioprostetic 03/04/2018 LIMA - LAD   Status post coronary artery  bypass graft 03/20/2018   LIMA to LAD during aortic valve replacement surgery 03/04/2018   Type 2 diabetes mellitus with hyperglycemia, with long-term current use of insulin  (HCC) 04/20/2020   Type 2 diabetes mellitus, uncontrolled, with neuropathy 05/20/2008   Qualifier: Diagnosis of  By: [name]     Past Surgical History:  Procedure Laterality Date   ABDOMINAL AORTOGRAM N/A 01/13/2018   Procedure: ABDOMINAL AORTOGRAM;  Surgeon: Burnard Debby LABOR, MD;  Location: [facility] INVASIVE CV LAB;  Service: Cardiovascular;  Laterality: N/A;   AORTIC VALVE REPLACEMENT N/A 03/04/2018   Procedure: AORTIC VALVE REPLACEMENT (AVR) 23mm Edwards Magna Ease Tissue Valve.;  Surgeon: Kerrin Elspeth BROCKS, MD;  Location: [facility] OR;  Service: Open Heart Surgery;  Laterality: N/A;   CARDIAC VALVE REPLACEMENT     COLONOSCOPY W/ POLYPECTOMY     COLONOSCOPY WITH PROPOFOL  N/A 02/22/2020   Procedure: COLONOSCOPY WITH PROPOFOL ;  Surgeon: Legrand Victory LITTIE DOUGLAS, MD;  Location: [facility];  Service: Gastroenterology;  Laterality: N/A;   CORONARY ARTERY BYPASS GRAFT N/A 03/04/2018   Procedure: CORONARY ARTERY BYPASS GRAFTING (CABG) x1:  LIMA to LAD.;  Surgeon: Kerrin Elspeth BROCKS, MD;  Location: [facility] OR;  Service: Open Heart Surgery;  Laterality: N/A;   CORONARY PRESSURE/FFR STUDY N/A 05/10/2020   Procedure: INTRAVASCULAR PRESSURE WIRE/FFR STUDY;  Surgeon: Wonda Sharper, MD;  Location: [facility] INVASIVE CV LAB;  Service: Cardiovascular;  Laterality: N/A;   HEMOSTASIS CLIP PLACEMENT  02/22/2020   Procedure: HEMOSTASIS CLIP PLACEMENT;  Surgeon: Legrand Victory LITTIE DOUGLAS, MD;  Location: [facility];  Service: Gastroenterology;;   LEFT HEART CATH AND CORS/GRAFTS ANGIOGRAPHY N/A 05/10/2020   Procedure: LEFT HEART CATH AND CORS/GRAFTS ANGIOGRAPHY;  Surgeon: Wonda Sharper, MD;  Location: [facility] INVASIVE CV LAB;  Service: Cardiovascular;  Laterality: N/A;   LIPOMA EXCISION Left 07/2008    back of shoulder Dr Nalani   POLYPECTOMY  02/22/2020    Procedure: POLYPECTOMY;  Surgeon: Legrand Victory LITTIE DOUGLAS, MD;  Location: [facility];  Service: Gastroenterology;;   RIGHT/LEFT HEART CATH AND CORONARY ANGIOGRAPHY N/A 01/13/2018   Procedure: RIGHT/LEFT HEART CATH AND CORONARY ANGIOGRAPHY;  Surgeon: Burnard Debby LABOR, MD;  Location: [facility] INVASIVE CV LAB;  Service: Cardiovascular;  Laterality: N/A;   TEE WITHOUT CARDIOVERSION N/A 03/04/2018   Procedure: TRANSESOPHAGEAL ECHOCARDIOGRAM (TEE);  Surgeon: Kerrin Elspeth BROCKS, MD;  Location: [facility] OR;  Service: Open Heart Surgery;  Laterality: N/A;   TONSILLECTOMY     WISDOM TOOTH EXTRACTION      Current Medications: Current Meds  Medication Sig   acetaminophen  (TYLENOL ) 500 MG tablet Take 1,000 mg by mouth every 6 (six) hours as needed for moderate pain or headache.   amoxicillin  (AMOXIL ) 500 MG capsule Take 4 tablets by mouth 1 hour before dental procedure.   aspirin  EC 325 MG EC tablet Take 1 tablet (325 mg total) by mouth daily.   budesonide -formoterol  (SYMBICORT ) 160-4.5 MCG/ACT inhaler Inhale 2 puffs into the lungs 2 (  two) times daily.   cetirizine  (ZYRTEC ) 10 MG tablet Take 10 mg by mouth daily as needed for allergies.   Cyanocobalamin  (VITAMIN B-12 PO) Take 1 tablet by mouth daily.   glucose blood (PRODIGY NO CODING BLOOD GLUC) test strip Use as instructed to check blood sugar twice a day.  DX  E11.40   insulin  aspart (NOVOLOG  FLEXPEN) 100 UNIT/ML FlexPen Max daily 30 units   insulin  glargine (LANTUS  SOLOSTAR) 100 UNIT/ML Solostar Pen Inject 18 Units into the skin daily.   Insulin  Pen Needle (BD PEN NEEDLE NANO 2ND GEN) 32G X 4 MM MISC Inject 1 Device into the skin in the morning, at noon, in the evening, and at bedtime.   isosorbide  mononitrate (IMDUR ) 30 MG 24 hr tablet TAKE 1 TABLET BY MOUTH EVERY DAY   metFORMIN  (GLUCOPHAGE ) 1000 MG tablet Take 1 tablet (1,000 mg total) by mouth 2 (two) times daily with a meal.   metoprolol  succinate (TOPROL -XL) 50 MG 24 hr tablet TAKE 1 TABLET BY MOUTH EVERY DAY    OXYGEN  Inhale 2 L/min into the lungs continuous.   rosuvastatin  (CRESTOR ) 20 MG tablet Take 1 tablet (20 mg total) by mouth daily.   sacubitril -valsartan  (ENTRESTO ) 97-103 MG TAKE 1 TABLET BY MOUTH TWICE A DAY   spironolactone  (ALDACTONE ) 25 MG tablet TAKE 1/2 TABLET BY MOUTH EVERY DAY   [DISCONTINUED] hydrALAZINE  (APRESOLINE ) 50 MG tablet Take 1 tablet (50 mg total) by mouth 3 (three) times daily.     Allergies:   Farxiga  [dapagliflozin ], Lisinopril , Amlodipine besylate, and Hydrochlorothiazide   Social History   Socioeconomic History   Marital status: Married    Spouse name: Not on file   Number of children: 3   Years of education: Not on file   Highest education level: Some college, no degree  Occupational History   Not on file  Tobacco Use   Smoking status: Former    Current packs/day: 0.00    Average packs/day: 1.5 packs/day for 27.0 years (40.5 ttl pk-yrs)    Types: Cigarettes    Start date: 44    Quit date: 2000    Years since quitting: 25.1   Smokeless tobacco: Never   Tobacco comments:    Smoked heavy from 22 until 2000, does not qualify for screening  Vaping Use   Vaping status: Never Used  Substance and Sexual Activity   Alcohol use: Yes    Alcohol/week: 2.0 standard drinks of alcohol    Types: 2 Glasses of wine per week    Comment: occasionally   Drug use: Not Currently   Sexual activity: Not Currently  Other Topics Concern   Not on file  Social History Narrative   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   Married    Former Smoker -  33 pack year history   Alcohol use-no     Drug use-no             Social Drivers of Corporate Investment Banker Strain: Low Risk  (06/17/2023)   Overall Financial Resource Strain (CARDIA)    Difficulty of Paying Living Expenses: Not hard at all  Food Insecurity: No Food Insecurity (06/17/2023)   Hunger Vital Sign    Worried About Running Out of Food in the Last Year: Never true    Ran Out of Food in the Last Year:  Never true  Transportation Needs: No Transportation Needs (06/17/2023)   PRAPARE - Administrator, Civil Service (Medical): No    Lack of Transportation (  Non-Medical): No  Physical Activity: Insufficiently Active (06/17/2023)   Exercise Vital Sign    Days of Exercise per Week: 1 day    Minutes of Exercise per Session: 10 min  Stress: No Stress Concern Present (06/17/2023)   [employer] of Occupational Health - Occupational Stress Questionnaire    Feeling of Stress : Only a little  Social Connections: Socially Integrated (06/17/2023)   Social Connection and Isolation Panel [NHANES]    Frequency of Communication with Friends and Family: More than three times a week    Frequency of Social Gatherings with Friends and Family: Twice a week    Attends Religious Services: 1 to 4 times per year    Active Member of [employer] or Organizations: Yes    Attends Engineer, Structural: More than 4 times per year    Marital Status: Married     Family History: The patient's family history includes Alzheimer's disease in his father; Diabetes in his father; Hypertension in his mother; Melanoma (age of onset: 57) in his father. There is no history of Colon cancer, Esophageal cancer, Colon polyps, or Stomach cancer.  ROS:   Review of Systems  Constitution: Negative for decreased appetite, fever and weight gain.  HENT: Negative for congestion, ear discharge, hoarse voice and sore throat.   Eyes: Negative for discharge, redness, vision loss in right eye and visual halos.  Cardiovascular: Negative for chest pain, dyspnea on exertion, leg swelling, orthopnea and palpitations.  Respiratory: Negative for cough, hemoptysis, shortness of breath and snoring.   Endocrine: Negative for heat intolerance and polyphagia.  Hematologic/Lymphatic: Negative for bleeding problem. Does not bruise/bleed easily.  Skin: Negative for flushing, nail changes, rash and suspicious lesions.  Musculoskeletal:  Negative for arthritis, joint pain, muscle cramps, myalgias, neck pain and stiffness.  Gastrointestinal: Negative for abdominal pain, bowel incontinence, diarrhea and excessive appetite.  Genitourinary: Negative for decreased libido, genital sores and incomplete emptying.  Neurological: Negative for brief paralysis, focal weakness, headaches and loss of balance.  Psychiatric/Behavioral: Negative for altered mental status, depression and suicidal ideas.  Allergic/Immunologic: Negative for HIV exposure and persistent infections.    EKGs/Labs/Other Studies Reviewed:    The following studies were reviewed today:   EKG:  The ekg ordered today demonstrates sinus rhythm, heart rate 82 bpm.  Compared to prior EKG no significant change.  Echo 11/2020 IMPRESSIONS   1. Left ventricular ejection fraction, by estimation, is 40 to 45%. The left ventricle has mildly decreased function. The left ventricle has no regional wall motion abnormalities. Left ventricular diastolic parameters are consistent with Grade I diastolic dysfunction (impaired relaxation).   2. Right ventricular systolic function is mildly reduced. The right ventricular size is normal.   3. The mitral valve is normal in structure. Trivial mitral valve regurgitation. No evidence of mitral stenosis.   4. The aortic valve is grossly normal. Aortic valve regurgitation is not visualized. There is a 23 mm Edwards Magna Valve valve present in the aortic position. Procedure Date: 03/13/2018. Valve appears to be functioning normally. Mean gradient across the valve 17mm HG.   5. The inferior vena cava is normal in size with greater than 50%  respiratory variability, suggesting right atrial pressure of 3 mmHg.   FINDINGS   Left Ventricle: Left ventricular ejection fraction, by estimation, is 40 to 45%. The left ventricle has mildly decreased function. The left ventricle has no regional wall motion abnormalities. The left ventricular internal cavity size  was normal in size. There is no left  ventricular hypertrophy. Abnormal (paradoxical) septal motion consistent with post-operative status. Left ventricular diastolic parameters are consistent with Grade I diastolic dysfunction (impaired relaxation).  Right Ventricle: The right ventricular size is normal. No increase in right ventricular wall thickness. Right ventricular systolic function is mildly reduced.   Left Atrium: Left atrial size was normal in size.   Right Atrium: Right atrial size was normal in size.   Pericardium: There is no evidence of pericardial effusion.    Mitral Valve: The mitral valve is normal in structure. There is mild  thickening of the mitral valve leaflet(s). Trivial mitral valve  regurgitation. No evidence of mitral valve stenosis.   Tricuspid Valve: The tricuspid valve is normal in structure. Tricuspid  valve regurgitation is trivial. No evidence of tricuspid stenosis.   Aortic Valve: The aortic valve is grossly normal. Aortic valve  regurgitation is not visualized. No aortic stenosis is present. Aortic  valve mean gradient measures 15.0 mmHg. Aortic valve peak gradient  measures 27.0 mmHg. Aortic valve area, by VTI  measures 1.46 cm. There is a 23 mm Edwards Magna Valve valve present in  the aortic position. Procedure Date: 03/13/2018.   Pulmonic Valve: The pulmonic valve was normal in structure. Pulmonic valve  regurgitation is not visualized. No evidence of pulmonic stenosis.   Aorta: The aortic root is normal in size and structure.   Venous: The inferior vena cava is normal in size with greater than 50%  respiratory variability, suggesting right atrial pressure of 3 mmHg.   IAS/Shunts: No atrial level shunt detected by color flow Doppler.    Recent Labs: 10/01/2023: Hemoglobin 11.9; Platelet Count 323 10/02/2023: ALT 8; BUN 13; Creatinine, Ser 1.10; Magnesium  2.1; Potassium 5.5; Sodium 140  Recent Lipid Panel    Component Value Date/Time   CHOL 118  10/02/2023 1034   TRIG 54 10/02/2023 1034   HDL 46 10/02/2023 1034   CHOLHDL 2.6 10/02/2023 1034   CHOLHDL 3 06/18/2023 1146   VLDL 16.6 06/18/2023 1146   LDLCALC 60 10/02/2023 1034   LDLDIRECT 177.6 10/07/2007 1001    Physical Exam:    VS:  BP 138/78   Pulse 75   Ht 6' 4 (1.93 m)   Wt 178 lb (80.7 kg)   SpO2 90%   BMI 21.67 kg/m     Wt Readings from Last 3 Encounters:  10/02/23 178 lb (80.7 kg)  10/01/23 175 lb (79.4 kg)  07/28/23 175 lb 3.2 oz (79.5 kg)     GEN: Well nourished, well developed in no acute distress HEENT: Normal NECK: No JVD; No carotid bruits LYMPHATICS: No lymphadenopathy CARDIAC: S1S2 noted,RRR, no murmurs, rubs, gallops RESPIRATORY:  Clear to auscultation without rales, wheezing or rhonchi  ABDOMEN: Soft, non-tender, non-distended, +bowel sounds, no guarding. EXTREMITIES: No edema, No cyanosis, no clubbing MUSCULOSKELETAL:  No deformity  SKIN: Warm and dry NEUROLOGIC:  Alert and oriented x 3, non-focal PSYCHIATRIC:  Normal affect, good insight  ASSESSMENT:    1. Medication management   2. Mixed hyperlipidemia   3. Primary hypertension   4. Coronary artery disease involving native coronary artery of native heart without angina pectoris   5. Paroxysmal atrial fibrillation (HCC)   6. Status post coronary artery bypass graft   7. Depressed left ventricular ejection fraction   8. Status post aortic valve replacement with bioprosthetic valve      PLAN: Exam   Echo in 05/2022 EF had improved now 50-55%. Continue current medication regimen.  Blood pressure is within target.  Coronary artery disease having no angina symptoms.  We will continue his current medication.    Needs abx for dental work in the setting of hx of valve replacement.  Has prosthetic valve monitoring echo schedule for June 2025.   The patient is in agreement with the above plan. The patient left the office in stable condition.  The patient will follow up in 9months or  sooner if needed.   Medication Adjustments/Labs and Tests Ordered: Current medicines are reviewed at length with the patient today.  Concerns regarding medicines are outline she d above.  Orders Placed This Encounter  Procedures   Comprehensive Metabolic Panel (CMET)   Magnesium    Lipid panel   No orders of the defined types were placed in this encounter.   Patient Instructions  Medication Instructions:  Your physician recommends that you continue on your current medications as directed. Please refer to the Current Medication list given to you today.  *If you need a refill on your cardiac medications before your next appointment, please call your pharmacy*   Lab Work: CMET, Mag, Lipids If you have labs (blood work) drawn today and your tests are completely normal, you will receive your results only by: MyChart Message (if you have MyChart) OR A paper copy in the mail If you have any lab test that is abnormal or we need to change your treatment, we will call you to review the results.   Follow-Up: At [facility], you and your health needs are our priority.  As part of our continuing mission to provide you with exceptional heart care, we have created designated Provider Care Teams.  These Care Teams include your primary Cardiologist (physician) and Advanced Practice Providers (APPs -  Physician Assistants and Nurse Practitioners) who all work together to provide you with the care you need, when you need it.    Your next appointment:   6 month(s)  Provider:   Michole Lecuyer, DO      Adopting a Healthy Lifestyle.  Know what a healthy weight is for you (roughly BMI <25) and aim to maintain this   Aim for 7+ servings of fruits and vegetables daily   65-80+ fluid ounces of water or unsweet tea for healthy kidneys   Limit to max 1 drink of alcohol per day; avoid smoking/tobacco   Limit animal fats in diet for cholesterol and heart health - choose grass fed whenever  available   Avoid highly processed foods, and foods high in saturated/trans fats   Aim for low stress - take time to unwind and care for your mental health   Aim for 150 min of moderate intensity exercise weekly for heart health, and weights twice weekly for bone health   Aim for 7-9 hours of sleep daily   When it comes to diets, agreement about the perfect plan isnt easy to find, even among the experts. Experts at the [facility] of [employer] developed an idea known as the Healthy Eating Plate. Just imagine a plate divided into logical, healthy portions.   The emphasis is on diet quality:   Load up on vegetables and fruits - one-half of your plate: Aim for color and variety, and remember that potatoes dont count.   Go for whole grains - one-quarter of your plate: Whole wheat, barley, wheat berries, quinoa, oats, brown rice, and foods made with them. If you want pasta, go with whole wheat pasta.   Protein power - one-quarter of your plate: Fish, chicken, beans,  and nuts are all healthy, versatile protein sources. Limit red meat.   The diet, however, does go beyond the plate, offering a few other suggestions.   Use healthy plant oils, such as olive, canola, soy, corn, sunflower and peanut. Check the labels, and avoid partially hydrogenated oil, which have unhealthy trans fats.   If youre thirsty, drink water. Coffee and tea are good in moderation, but skip sugary drinks and limit milk and dairy products to one or two daily servings.   The type of carbohydrate in the diet is more important than the amount. Some sources of carbohydrates, such as vegetables, fruits, whole grains, and beans-are healthier than others.   Finally, stay active  Signed, Daviana Haymaker, DO  10/04/2023 10:21 PM    [facility]

## 2023-10-02 NOTE — Patient Instructions (Signed)
 Medication Instructions:  Your physician recommends that you continue on your current medications as directed. Please refer to the Current Medication list given to you today.  *If you need a refill on your cardiac medications before your next appointment, please call your pharmacy*   Lab Work: CMET, Mag, Lipids If you have labs (blood work) drawn today and your tests are completely normal, you will receive your results only by: MyChart Message (if you have MyChart) OR A paper copy in the mail If you have any lab test that is abnormal or we need to change your treatment, we will call you to review the results.   Follow-Up: At [facility], you and your health needs are our priority.  As part of our continuing mission to provide you with exceptional heart care, we have created designated Provider Care Teams.  These Care Teams include your primary Cardiologist (physician) and Advanced Practice Providers (APPs -  Physician Assistants and Nurse Practitioners) who all work together to provide you with the care you need, when you need it.    Your next appointment:   6 month(s)  Provider:   Kardie Tobb, DO

## 2023-10-06 ENCOUNTER — Other Ambulatory Visit: Payer: Self-pay | Admitting: Family

## 2023-10-06 ENCOUNTER — Telehealth: Payer: Self-pay

## 2023-10-06 NOTE — Telephone Encounter (Signed)
 Copied from CRM [id number]. Topic: General - Other >> Oct 06, 2023 11:40 AM Jorie Newness J wrote: Reason for CRM: Pt is requesting a callback to discuss the recent recert orders for the pts oxygen  order

## 2023-10-06 NOTE — Telephone Encounter (Signed)
 Spoke w/ Pt's wife, Caryl Clas- Pt is needing new strap for his portable O2 machine, she was checking status of paperwork. Informed that PCP did sign and fax back paperwork to [employer] on 09/29/23. Cathy verbalized understanding.

## 2023-10-08 ENCOUNTER — Other Ambulatory Visit: Payer: Self-pay

## 2023-10-08 DIAGNOSIS — R899 Unspecified abnormal finding in specimens from other organs, systems and tissues: Secondary | ICD-10-CM

## 2023-10-20 ENCOUNTER — Ambulatory Visit: Payer: Medicare PPO | Admitting: Internal Medicine

## 2023-10-20 DIAGNOSIS — J9611 Chronic respiratory failure with hypoxia: Secondary | ICD-10-CM | POA: Diagnosis not present

## 2023-10-21 ENCOUNTER — Ambulatory Visit: Payer: Medicare PPO | Admitting: Internal Medicine

## 2023-10-21 VITALS — BP 138/78 | HR 77 | Temp 97.8°F | Resp 12 | Ht 76.0 in | Wt 178.0 lb

## 2023-10-21 DIAGNOSIS — I2510 Atherosclerotic heart disease of native coronary artery without angina pectoris: Secondary | ICD-10-CM

## 2023-10-21 DIAGNOSIS — Z23 Encounter for immunization: Secondary | ICD-10-CM | POA: Diagnosis not present

## 2023-10-21 DIAGNOSIS — I420 Dilated cardiomyopathy: Secondary | ICD-10-CM | POA: Diagnosis not present

## 2023-10-21 DIAGNOSIS — I10 Essential (primary) hypertension: Secondary | ICD-10-CM | POA: Diagnosis not present

## 2023-10-21 DIAGNOSIS — J449 Chronic obstructive pulmonary disease, unspecified: Secondary | ICD-10-CM

## 2023-10-21 NOTE — Patient Instructions (Addendum)
 Vaccines I recommend: COVID booster RSV  Check the  blood pressure regularly Blood pressure goal:  between 110/65 and  135/85. If it is consistently higher or lower, let me know     GO TO THE LAB : Get the blood work     Next visit with me, 4 months CPX     Please schedule it at the front desk

## 2023-10-21 NOTE — Progress Notes (Unsigned)
 Subjective:    Patient ID: [name], male    DOB: 1950-11-09, 73 y.o.   MRN: [id number]  DOS:  10/21/2023 Type of visit - description: f/u, here with his wife.  In general feeling well. On Symbicort, good compliance, no major cough. Uses oxygen at night, needs a portable oxygen. No recent O2 sats.  Review of Systems See above   Past Medical History:  Diagnosis Date   Abnormal myocardial perfusion study 2006   EF 44% ? inferoseptal ischemia. no cath done   Allergy    Benign neoplasm of cecum    Breast mass, right 03/2008   Cardiomyopathy (HCC) 07/20/2018   Ejection fraction 4045% in September 2019   Centrilobular emphysema (HCC) 08/25/2018   02/04/2018-high-res CT- no evidence of interstitial lung disease, severe centrilobular emphysema and mild diffuse bronchial wall thickening suggesting COPD, no calcified pleural plaques no pleural effusion, mild smooth bilateral pleural thickening which is nonspecific  02/04/2018-pulmonary function test- FVC 3.37 (71% predicted), postbronchodilator ratio 50, postbronchodilator FEV1 54, mid flow reve   CHF (congestive heart failure) (HCC)    COLONIC POLYPS, HX OF 03/18/2007   Qualifier: Diagnosis of  By: Nena Jordan    Contact dermatitis 01/31/2013   COPD (chronic obstructive pulmonary disease) (HCC)    "dx'd 12/2017"   COPD GOLD II A  08/25/2018   02/04/2018-high-res CT- no evidence of interstitial lung disease, severe centrilobular emphysema and mild diffuse bronchial wall thickening suggesting COPD, no calcified pleural plaques no pleural effusion, mild smooth bilateral pleural thickening which is nonspecific  02/04/2018-pulmonary function test- FVC 3.37 (71% predicted), postbronchodilator ratio 50, postbronchodilator FEV1 54, mid flow reve   Coronary artery disease    Critical aortic valve stenosis 01/12/2018   Calculated valve area less than 0.5 cm mean gradient more than 60 mm of mercury   DEGENERATIVE JOINT DISEASE, BACK  03/18/2007   Qualifier: Diagnosis of  By: Nena Jordan    Depressed left ventricular ejection fraction 03/28/2020   Diabetes mellitus (HCC) 07/25/2020   Dyslipidemia 04/20/2020   Dyspnea on exertion 01/12/2018   Emphysema of lung (HCC)    ERECTILE DYSFUNCTION, ORGANIC 02/10/2009   Qualifier: Diagnosis of  By: Nena Jordan    Former cigarette smoker 10/12/2018   Quit in 2012 41-pack-year smoking history   Goals of care, counseling/discussion 08/24/2020   H/O aortic valve replacement 05/05/2020   Heart murmur    HEMORRHOIDS, INTERNAL 03/18/2007   Qualifier: Diagnosis of  By: Nena Jordan    HTN (hypertension) 10/21/2007        Hyperlipemia 10/21/2007   Qualifier: Diagnosis of  By: Nena Jordan    Hypertension    Hypoglycemia due to insulin 03/26/2020   Low back pain    MYOCARDIAL PERFUSION SCAN, WITH STRESS TEST, ABNORMAL 08/11/2008   Qualifier: Diagnosis of  By: Eden Emms, MD, Harrington Challenger    Nocturnal hypoxemia 08/25/2018   04/09/18 >>> ONO - telephone note>>> Patient's ONO on room air showed that he was less than 88% for more than an hour. Patient needs to be on 2 L at HS.  10/06/2018-overnight oximetry-entire duration was 4 hours and 23 minutes, SPO2 less than 88% for 25 minutes and 32 seconds     Oxygen deficiency    uses 2L at night   Paroxysmal atrial fibrillation (HCC) 10/12/2018   PCP NOTES >>>>>>>>>>>>>>>>> 05/14/2020   Polycythemia vera (HCC) 08/04/2020   S/P AVR 03/04/2018   AVR  23 mm Edwards Magna Valve - bioprostetic 03/04/2018 LIMA - LAD   Status post coronary artery bypass graft 03/20/2018   LIMA to LAD during aortic valve replacement surgery 03/04/2018   Type 2 diabetes mellitus with hyperglycemia, with long-term current use of insulin (HCC) 04/20/2020   Type 2 diabetes mellitus, uncontrolled, with neuropathy 05/20/2008   Qualifier: Diagnosis of  By: Nena Jordan     Past Surgical History:  Procedure Laterality Date   ABDOMINAL  AORTOGRAM N/A 01/13/2018   Procedure: ABDOMINAL AORTOGRAM;  Surgeon: Lennette Bihari, MD;  Location: [facility] INVASIVE CV LAB;  Service: Cardiovascular;  Laterality: N/A;   AORTIC VALVE REPLACEMENT N/A 03/04/2018   Procedure: AORTIC VALVE REPLACEMENT (AVR) 23mm Edwards Magna Ease Tissue Valve.;  Surgeon: Loreli Slot, MD;  Location: [facility] OR;  Service: Open Heart Surgery;  Laterality: N/A;   CARDIAC VALVE REPLACEMENT     COLONOSCOPY W/ POLYPECTOMY     COLONOSCOPY WITH PROPOFOL N/A 02/22/2020   Procedure: COLONOSCOPY WITH PROPOFOL;  Surgeon: Sherrilyn Rist, MD;  Location: [facility];  Service: Gastroenterology;  Laterality: N/A;   CORONARY ARTERY BYPASS GRAFT N/A 03/04/2018   Procedure: CORONARY ARTERY BYPASS GRAFTING (CABG) x1:  LIMA to LAD.;  Surgeon: Loreli Slot, MD;  Location: [facility] OR;  Service: Open Heart Surgery;  Laterality: N/A;   CORONARY PRESSURE/FFR STUDY N/A 05/10/2020   Procedure: INTRAVASCULAR PRESSURE WIRE/FFR STUDY;  Surgeon: Tonny Bollman, MD;  Location[facility] INVASIVE CV LAB;  Service: Cardiovascular;  Laterality: N/A;   HEMOSTASIS CLIP PLACEMENT  02/22/2020   Procedure: HEMOSTASIS CLIP PLACEMENT;  Surgeon: Sherrilyn Rist, MD;  Location: [facility];  Service: Gastroenterology;;   LEFT HEART CATH AND CORS/GRAFTS ANGIOGRAPHY N/A 05/10/2020   Procedure: LEFT HEART CATH AND CORS/GRAFTS ANGIOGRAPHY;  Surgeon: Tonny Bollman, MD;  Location: [facility] INVASIVE CV LAB;  Service: Cardiovascular;  Laterality: N/A;   LIPOMA EXCISION Left 07/2008    "back of shoulder" Dr Harlon Flor   POLYPECTOMY  02/22/2020   Procedure: POLYPECTOMY;  Surgeon: Sherrilyn Rist, MD;  Location: [facility];  Service: Gastroenterology;;   RIGHT/LEFT HEART CATH AND CORONARY ANGIOGRAPHY N/A 01/13/2018   Procedure: RIGHT/LEFT HEART CATH AND CORONARY ANGIOGRAPHY;  Surgeon: Lennette Bihari, MD;  Location: [facility] INVASIVE CV LAB;  Service: Cardiovascular;  Laterality: N/A;   TEE WITHOUT CARDIOVERSION N/A 03/04/2018    Procedure: TRANSESOPHAGEAL ECHOCARDIOGRAM (TEE);  Surgeon: Loreli Slot, MD;  Location: [facility] OR;  Service: Open Heart Surgery;  Laterality: N/A;   TONSILLECTOMY     WISDOM TOOTH EXTRACTION      Current Outpatient Medications  Medication Instructions   acetaminophen (TYLENOL) 1,000 mg, Every 6 hours PRN   amoxicillin (AMOXIL) 500 MG capsule Take 4 tablets by mouth 1 hour before dental procedure.   aspirin EC 325 mg, Oral, Daily   budesonide-formoterol (SYMBICORT) 160-4.5 MCG/ACT inhaler 2 puffs, Inhalation, 2 times daily   cetirizine (ZYRTEC) 10 mg, Daily PRN   Cyanocobalamin (VITAMIN B-12 PO) 1 tablet, Daily   glucose blood (PRODIGY NO CODING BLOOD GLUC) test strip Use as instructed to check blood sugar twice a day.  DX  E11.40    hydrALAZINE (APRESOLINE) 50 mg, Oral, 3 times daily   insulin aspart (NOVOLOG FLEXPEN) 100 UNIT/ML FlexPen Max daily 30 units   Insulin Pen Needle (BD PEN NEEDLE NANO 2ND GEN) 32G X 4 MM MISC 1 Device, Subcutaneous, 4 times daily   isosorbide mononitrate (IMDUR) 30 mg, Oral, Daily   Lantus SoloStar  18 Units, Subcutaneous, Daily   metFORMIN (GLUCOPHAGE) 1,000 mg, Oral, 2 times daily with meals   metoprolol succinate (TOPROL-XL) 50 mg, Oral, Daily   OXYGEN 2 L/min, Continuous   rosuvastatin (CRESTOR) 20 mg, Oral, Daily   sacubitril-valsartan (ENTRESTO) 97-103 MG 1 tablet, Oral, 2 times daily   spironolactone (ALDACTONE) 25 MG tablet TAKE 1/2 TABLET BY MOUTH EVERY DAY       Objective:   Physical Exam BP 138/78 (BP Location: Right Arm, Cuff Size: Normal)   Pulse 77   Temp 97.8 F (36.6 C) (Oral)   Resp 12   Ht 6\' 4"  (1.93 m)   Wt 178 lb (80.7 kg)   SpO2 94%   BMI 21.67 kg/m  General:   Well developed, NAD, BMI noted. HEENT:  Normocephalic . Face symmetric, atraumatic Lungs:  Decreased breath sounds but clear.  Not on oxygen supplement at this point Normal respiratory effort, no intercostal retractions, no accessory muscle use. Heart: RRR,  soft systolic murmur.  Lower extremities: no pretibial edema bilaterally  Skin: Not pale. Not jaundice Neurologic:  alert & oriented X3.  Speech normal, gait appropriate for age and unassisted Psych--  Cognition and judgment appear intact.  Cooperative with normal attention span and concentration.  Behavior appropriate. No anxious or depressed appearing.      Assessment    Problem list (new patient 03/2020) DM HTN CAD, CABG and aortic valve replacement due to stenosis on 02/2018 Cardiomyopathy, cath 05/10/2020, Rx medical treatment COPD 02/04/2018-high-res CT- no evidence of interstitial lung disease, severe centrilobular emphysema  02/04/2018-pulmonary function test- FVC 3.37 (71% predicted),  mid flow reversibility after bronchodilator  Polycythemia vera, + JAK2 Dx 98-1191.  PLAN:  DM: Per Endo. COPD, exertional hypoxia. Since LOV, I switched him to Symbicort, good compliance, no apparent side effects, minimal cough. Uses O2 sat nightly, would like to get a portable oxygen device for daytime. O2 today at rest 94 to 98%, dropped to 82% with exertion. Extensive documentation to be completed, will try to get his daytime O2 device HTN: BP satisfactory today, recommend to check at home.  Last potassium was elevated, cardiology rec to hold Aldactone for 3 days and recheck.  Will do. Otherwise continue with hydralazine, Imdur, metoprolol, Entresto, Aldactone. CAD, cardiomyopathy: Saw cardiology 10/02/2023, cholesterol last check, was very good, no angina noted. Next echo, June 2025 Aortic valve replacement, last visit with Dr. Excell Seltzer 07/28/2023 Polycythemia vera: Last visit with hematology October 01, 2023.  Next visit 6 Months Vax advised: PNM 20 today, recommend to consider COVID-vaccine and RSV.  Benefits discussed RTC CPX 4 months

## 2023-10-22 ENCOUNTER — Encounter: Payer: Self-pay | Admitting: Internal Medicine

## 2023-10-22 LAB — BASIC METABOLIC PANEL
BUN: 15 mg/dL (ref 6–23)
CO2: 26 meq/L (ref 19–32)
Calcium: 9.2 mg/dL (ref 8.4–10.5)
Chloride: 104 meq/L (ref 96–112)
Creatinine, Ser: 1.15 mg/dL (ref 0.40–1.50)
GFR: 63.74 mL/min (ref >60.00)
Glucose, Bld: 127 mg/dL — ABNORMAL HIGH (ref 70–99)
Potassium: 4.2 meq/L (ref 3.5–5.1)
Sodium: 139 meq/L (ref 135–145)

## 2023-10-22 NOTE — Assessment & Plan Note (Signed)
 DM: Per Endo. COPD, exertional hypoxia. Since LOV, I switched him to Symbicort, good compliance, no apparent side effects, minimal cough. Uses O2 sat nightly, would like to get a portable oxygen device for daytime. O2 today at rest 94 to 98%, dropped to 82% with exertion. Extensive documentation to be completed, will try to get his daytime O2 device HTN: BP satisfactory today, recommend to check at home.  Last potassium was elevated, cardiology rec to hold Aldactone for 3 days and recheck.  Will do. Otherwise continue with hydralazine, Imdur, metoprolol, Entresto, Aldactone. CAD, cardiomyopathy: Saw cardiology 10/02/2023, cholesterol last check, was very good, no angina noted. Next echo, June 2025 Aortic valve replacement, last visit with Dr. Excell Seltzer 07/28/2023 Polycythemia vera: Last visit with hematology October 01, 2023.  Next visit 6 Months Vax advised: PNM 20 today, recommend to consider COVID-vaccine and RSV.  Benefits discussed RTC CPX 4 months

## 2023-10-27 ENCOUNTER — Telehealth: Payer: Self-pay | Admitting: Cardiology

## 2023-10-27 MED ORDER — AMOXICILLIN 500 MG PO CAPS: ORAL_CAPSULE | ORAL | 3 refills | Status: DC

## 2023-10-27 NOTE — Telephone Encounter (Signed)
*  STAT* If patient is at the pharmacy, call can be transferred to refill team.   1. Which medications need to be refilled? (please list name of each medication and dose if known)  amoxicillin (AMOXIL) 500 MG capsule  2. Which pharmacy/location (including street and city if local pharmacy) is medication to be sent to? [id number] - [place], [place] - [address]    3. Do they need a 30 day or 90 day supply?   Standard supply. Wife states patient has dental work scheduled for Wednesday.

## 2023-11-04 ENCOUNTER — Other Ambulatory Visit: Payer: Self-pay | Admitting: Cardiology

## 2023-11-11 ENCOUNTER — Other Ambulatory Visit (HOSPITAL_COMMUNITY): Payer: Self-pay

## 2023-11-11 ENCOUNTER — Telehealth: Payer: Self-pay

## 2023-11-11 ENCOUNTER — Encounter: Payer: Self-pay | Admitting: Family

## 2023-11-11 NOTE — Telephone Encounter (Signed)
 Pharmacy Patient Advocate Encounter   Received notification from Pt Calls Messages that prior authorization for Symbicort 160-4.5 is required/requested.   Insurance verification completed.   The patient is insured through [employer] .   Per test claim: Refill too soon. PA is not needed at this time. Medication was filled 10/22/23. Next eligible fill date is 11/14/23.

## 2023-11-11 NOTE — Telephone Encounter (Signed)
 Noted.

## 2023-11-11 NOTE — Telephone Encounter (Signed)
 Copied from CRM [id number]. Topic: Clinical - Prescription Issue >> Nov 11, 2023 10:36 AM Eunice Blase wrote: Reason for CRM: Pt's wife called stated [employer] need reason for pt needing budesonide-formoterol (SYMBICORT) 160-4.5 MCG/ACT inhaler. Pt stated this is the medication most effective for his illness. Provider must contact [employer] to provide information and prior authorization for medication. Please also contact pt at [phone].

## 2023-11-11 NOTE — Telephone Encounter (Signed)
 Needs PA for Symbicort please.

## 2023-11-17 ENCOUNTER — Encounter: Payer: Self-pay | Admitting: Internal Medicine

## 2023-11-17 ENCOUNTER — Ambulatory Visit: Payer: Medicare PPO | Admitting: Internal Medicine

## 2023-11-17 ENCOUNTER — Ambulatory Visit: Admitting: Internal Medicine

## 2023-11-17 VITALS — BP 134/86 | HR 81 | Ht 76.0 in | Wt 178.0 lb

## 2023-11-17 DIAGNOSIS — E1159 Type 2 diabetes mellitus with other circulatory complications: Secondary | ICD-10-CM | POA: Diagnosis not present

## 2023-11-17 DIAGNOSIS — Z794 Long term (current) use of insulin: Secondary | ICD-10-CM | POA: Diagnosis not present

## 2023-11-17 DIAGNOSIS — J449 Chronic obstructive pulmonary disease, unspecified: Secondary | ICD-10-CM | POA: Diagnosis not present

## 2023-11-17 LAB — POCT GLYCOSYLATED HEMOGLOBIN (HGB A1C): Hemoglobin A1C: 6.6 % — AB (ref 4.0–5.6)

## 2023-11-17 MED ORDER — LANTUS SOLOSTAR 100 UNIT/ML ~~LOC~~ SOPN: 18.0000 [IU] | PEN_INJECTOR | Freq: Every day | SUBCUTANEOUS | 6 refills | Status: DC

## 2023-11-17 MED ORDER — NOVOLOG FLEXPEN 100 UNIT/ML ~~LOC~~ SOPN: PEN_INJECTOR | SUBCUTANEOUS | 4 refills | Status: DC

## 2023-11-17 MED ORDER — BD PEN NEEDLE NANO 2ND GEN 32G X 4 MM MISC: 1.0000 | Freq: Four times a day (QID) | 3 refills | Status: DC

## 2023-11-17 MED ORDER — METFORMIN HCL 1000 MG PO TABS: 1000.0000 mg | ORAL_TABLET | Freq: Two times a day (BID) | ORAL | 3 refills | Status: DC

## 2023-11-17 MED ORDER — PRODIGY NO CODING BLOOD GLUC VI STRP
ORAL_STRIP | 3 refills | Status: DC
Start: 2023-11-17 — End: 2024-05-17

## 2023-11-17 NOTE — Progress Notes (Deleted)
 Name: [name] [name]  Age/ Sex: 73 y.o., male   MRN/ DOB: [id number], August 20, 1951     PCP: Wanda Plump, MD   Reason for Endocrinology Evaluation: Type 2 Diabetes Mellitus  Initial Endocrine Consultative Visit: 04/19/2020    PATIENT IDENTIFIER: [name] [name] is a 73 y.o. male with a past medical history of COPD, non-obstructive CAD, severe AS ( S/P AVR/CABG), CHF, HTN , T2DM and OSA. The patient has followed with Endocrinology clinic since 04/19/2020 for consultative assistance with management of his diabetes.  DIABETIC HISTORY:  [name] [name] was diagnosed with DM in 2009. He has been on metformin and insulin for years.Metfromin dose was reduced by 50% due to hypoglycemia per pt.  His hemoglobin A1c has ranged from 7.2% in 2021, peaking at 8.8%in 2018  On his initial visit to our clinic his A1c was 7.2 % We adjusted MDI regimen and continued metformin   Due to a low BMI of 20.99 we checked GAD-65 and islet cell Ab's which were negative    Prandial insulin stopped 10/2020 but by  01/2021 he was already back on it and we continued   Marcelline Deist stopped due to genital rash 01/2021  SUBJECTIVE:   During the last visit (05/16/2023): A1c 6.7%   Today (11/17/2023): [name] [name] is here for a follow up on diabetes. He checks his blood sugars a 2-3  times daily. The patient has not  had hypoglycemic episodes since the last clinic visit.  Patient follows with cardiology for CAD, ischemic cardiomyopathy Continues to follow with hematology for polycythemia vera  He continues to follow-up with pulmonary for COPD SOB is stable  Denies nausea, vomiting  Denies constipation or diarrhea    HOME DIABETES REGIMEN:  Lantus 18 units daily  Metformin 1000 mg , BID Novolog 4 units with lunch and 8 with supper  JX:BJYNWGN (BG- 135/40)     Statin: yes ACE-I/ARB: yes   METER DOWNLOAD SUMMARY: Prodigy unable to download    DIABETIC COMPLICATIONS: Microvascular complications:   Retinopathy Denies: CKD , neuropathy  Last eye exam: Completed 09/25/2023   Macrovascular complications:  CAD Denies: PVD, CVA   HISTORY:  Past Medical History:  Past Medical History:  Diagnosis Date   Abnormal myocardial perfusion study 2006   EF 44% ? inferoseptal ischemia. no cath done   Allergy    Benign neoplasm of cecum    Breast mass, right 03/2008   Cardiomyopathy (HCC) 07/20/2018   Ejection fraction 4045% in September 2019   Centrilobular emphysema (HCC) 08/25/2018   02/04/2018-high-res CT- no evidence of interstitial lung disease, severe centrilobular emphysema and mild diffuse bronchial wall thickening suggesting COPD, no calcified pleural plaques no pleural effusion, mild smooth bilateral pleural thickening which is nonspecific  02/04/2018-pulmonary function test- FVC 3.37 (71% predicted), postbronchodilator ratio 50, postbronchodilator FEV1 54, mid flow reve   CHF (congestive heart failure) (HCC)    COLONIC POLYPS, HX OF 03/18/2007   Qualifier: Diagnosis of  By: Nena Jordan    Contact dermatitis 01/31/2013   COPD (chronic obstructive pulmonary disease) (HCC)    "dx'd 12/2017"   COPD GOLD II A  08/25/2018   02/04/2018-high-res CT- no evidence of interstitial lung disease, severe centrilobular emphysema and mild diffuse bronchial wall thickening suggesting COPD, no calcified pleural plaques no pleural effusion, mild smooth bilateral pleural thickening which is nonspecific  02/04/2018-pulmonary function test- FVC 3.37 (71% predicted), postbronchodilator ratio 50, postbronchodilator FEV1 54, mid flow reve   Coronary artery disease  Critical aortic valve stenosis 01/12/2018   Calculated valve area less than 0.5 cm mean gradient more than 60 mm of mercury   DEGENERATIVE JOINT DISEASE, BACK 03/18/2007   Qualifier: Diagnosis of  By: Nena Jordan    Depressed left ventricular ejection fraction 03/28/2020   Diabetes mellitus (HCC) 07/25/2020   Dyslipidemia 04/20/2020    Dyspnea on exertion 01/12/2018   Emphysema of lung (HCC)    ERECTILE DYSFUNCTION, ORGANIC 02/10/2009   Qualifier: Diagnosis of  By: Nena Jordan    Former cigarette smoker 10/12/2018   Quit in 2012 41-pack-year smoking history   Goals of care, counseling/discussion 08/24/2020   H/O aortic valve replacement 05/05/2020   Heart murmur    HEMORRHOIDS, INTERNAL 03/18/2007   Qualifier: Diagnosis of  By: Nena Jordan    HTN (hypertension) 10/21/2007        Hyperlipemia 10/21/2007   Qualifier: Diagnosis of  By: Nena Jordan    Hypertension    Hypoglycemia due to insulin 03/26/2020   Low back pain    MYOCARDIAL PERFUSION SCAN, WITH STRESS TEST, ABNORMAL 08/11/2008   Qualifier: Diagnosis of  By: Eden Emms, MD, Harrington Challenger    Nocturnal hypoxemia 08/25/2018   04/09/18 >>> ONO - telephone note>>> Patient's ONO on room air showed that he was less than 88% for more than an hour. Patient needs to be on 2 L at HS.  10/06/2018-overnight oximetry-entire duration was 4 hours and 23 minutes, SPO2 less than 88% for 25 minutes and 32 seconds     Oxygen deficiency    uses 2L at night   Paroxysmal atrial fibrillation (HCC) 10/12/2018   PCP NOTES >>>>>>>>>>>>>>>>> 05/14/2020   Polycythemia vera (HCC) 08/04/2020   S/P AVR 03/04/2018   AVR 23 mm Edwards Magna Valve - bioprostetic 03/04/2018 LIMA - LAD   Status post coronary artery bypass graft 03/20/2018   LIMA to LAD during aortic valve replacement surgery 03/04/2018   Type 2 diabetes mellitus with hyperglycemia, with long-term current use of insulin (HCC) 04/20/2020   Type 2 diabetes mellitus, uncontrolled, with neuropathy 05/20/2008   Qualifier: Diagnosis of  By: Nena Jordan    Past Surgical History:  Past Surgical History:  Procedure Laterality Date   ABDOMINAL AORTOGRAM N/A 01/13/2018   Procedure: ABDOMINAL AORTOGRAM;  Surgeon: Lennette Bihari, MD;  Location: [facility] INVASIVE CV LAB;  Service: Cardiovascular;  Laterality: N/A;    AORTIC VALVE REPLACEMENT N/A 03/04/2018   Procedure: AORTIC VALVE REPLACEMENT (AVR) 23mm Edwards Magna Ease Tissue Valve.;  Surgeon: Loreli Slot, MD;  Location: [facility] OR;  Service: Open Heart Surgery;  Laterality: N/A;   CARDIAC VALVE REPLACEMENT     COLONOSCOPY W/ POLYPECTOMY     COLONOSCOPY WITH PROPOFOL N/A 02/22/2020   Procedure: COLONOSCOPY WITH PROPOFOL;  Surgeon: Sherrilyn Rist, MD;  Location: [facility] ENDOSCOPY;  Service: Gastroenterology;  Laterality: N/A;   CORONARY ARTERY BYPASS GRAFT N/A 03/04/2018   Procedure: CORONARY ARTERY BYPASS GRAFTING (CABG) x1:  LIMA to LAD.;  Surgeon: Loreli Slot, MD;  Location: [facility] OR;  Service: Open Heart Surgery;  Laterality: N/A;   CORONARY PRESSURE/FFR STUDY N/A 05/10/2020   Procedure: INTRAVASCULAR PRESSURE WIRE/FFR STUDY;  Surgeon: Tonny Bollman, MD;  Location: [facility] INVASIVE CV LAB;  Service: Cardiovascular;  Laterality: N/A;   HEMOSTASIS CLIP PLACEMENT  02/22/2020   Procedure: HEMOSTASIS CLIP PLACEMENT;  Surgeon: Sherrilyn Rist, MD;  Location: [facility] ENDOSCOPY;  Service: Gastroenterology;;   LEFT  HEART CATH AND CORS/GRAFTS ANGIOGRAPHY N/A 05/10/2020   Procedure: LEFT HEART CATH AND CORS/GRAFTS ANGIOGRAPHY;  Surgeon: Tonny Bollman, MD;  Location: [facility] INVASIVE CV LAB;  Service: Cardiovascular;  Laterality: N/A;   LIPOMA EXCISION Left 07/2008    "back of shoulder" Dr Harlon Flor   POLYPECTOMY  02/22/2020   Procedure: POLYPECTOMY;  Surgeon: Sherrilyn Rist, MD;  Location: [facility] ENDOSCOPY;  Service: Gastroenterology;;   RIGHT/LEFT HEART CATH AND CORONARY ANGIOGRAPHY N/A 01/13/2018   Procedure: RIGHT/LEFT HEART CATH AND CORONARY ANGIOGRAPHY;  Surgeon: Lennette Bihari, MD;  Location: [facility] INVASIVE CV LAB;  Service: Cardiovascular;  Laterality: N/A;   TEE WITHOUT CARDIOVERSION N/A 03/04/2018   Procedure: TRANSESOPHAGEAL ECHOCARDIOGRAM (TEE);  Surgeon: Loreli Slot, MD;  Location: [facility] OR;  Service: Open Heart Surgery;  Laterality: N/A;   TONSILLECTOMY      WISDOM TOOTH EXTRACTION     Social History:  reports that he quit smoking about 25 years ago. His smoking use included cigarettes. He started smoking about 52 years ago. He has a 40.5 pack-year smoking history. He has never used smokeless tobacco. He reports current alcohol use of about 2.0 standard drinks of alcohol per week. He reports that he does not currently use drugs. Family History:  Family History  Problem Relation Age of Onset   Melanoma Father 36       deceased secondary to melanoma   Alzheimer's disease Father    Diabetes Father    Hypertension Mother        alive -27   Colon cancer Neg Hx    Esophageal cancer Neg Hx    Colon polyps Neg Hx    Stomach cancer Neg Hx      HOME MEDICATIONS: Allergies as of 11/17/2023       Reactions   Farxiga [dapagliflozin] Rash, Other (See Comments)   "swollen penis"   Lisinopril Other (See Comments)   Hyperkalemia   Amlodipine Besylate Other (See Comments)   REACTION: ? caused left axillary nodules/chest flutter   Hydrochlorothiazide Hives        Medication List        Accurate as of November 17, 2023  6:59 AM. If you have any questions, ask your nurse or doctor.          acetaminophen 500 MG tablet Commonly known as: TYLENOL Take 1,000 mg by mouth every 6 (six) hours as needed for moderate pain or headache.   amoxicillin 500 MG capsule Commonly known as: AMOXIL Take 4 tablets by mouth 1 hour before dental procedure.   aspirin EC 325 MG tablet Take 1 tablet (325 mg total) by mouth daily.   BD Pen Needle Nano 2nd Gen 32G X 4 MM Misc Generic drug: Insulin Pen Needle Inject 1 Device into the skin in the morning, at noon, in the evening, and at bedtime.   budesonide-formoterol 160-4.5 MCG/ACT inhaler Commonly known as: SYMBICORT Inhale 2 puffs into the lungs 2 (two) times daily.   cetirizine 10 MG tablet Commonly known as: ZYRTEC Take 10 mg by mouth daily as needed for allergies.   Entresto 97-103 MG Generic  drug: sacubitril-valsartan TAKE 1 TABLET BY MOUTH TWICE A DAY   glucose blood test strip Commonly known as: Prodigy No Coding Blood Gluc Use as instructed to check blood sugar twice a day.  DX  E11.40   hydrALAZINE 50 MG tablet Commonly known as: APRESOLINE TAKE 1 TABLET BY MOUTH THREE TIMES A DAY   isosorbide mononitrate 30 MG 24 hr  tablet Commonly known as: IMDUR TAKE 1 TABLET BY MOUTH EVERY DAY   Lantus SoloStar 100 UNIT/ML Solostar Pen Generic drug: insulin glargine Inject 18 Units into the skin daily.   metFORMIN 1000 MG tablet Commonly known as: GLUCOPHAGE Take 1 tablet (1,000 mg total) by mouth 2 (two) times daily with a meal.   metoprolol succinate 50 MG 24 hr tablet Commonly known as: TOPROL-XL TAKE 1 TABLET BY MOUTH EVERY DAY   NovoLOG FlexPen 100 UNIT/ML FlexPen Generic drug: insulin aspart Max daily 30 units   OXYGEN Inhale 2 L/min into the lungs continuous. Use at night   rosuvastatin 20 MG tablet Commonly known as: CRESTOR Take 1 tablet (20 mg total) by mouth daily.   spironolactone 25 MG tablet Commonly known as: ALDACTONE TAKE 1/2 TABLET BY MOUTH DAILY   VITAMIN B-12 PO Take 1 tablet by mouth daily.         OBJECTIVE:   Vital Signs: There were no vitals taken for this visit.  Wt Readings from Last 3 Encounters:  10/21/23 178 lb (80.7 kg)  10/02/23 178 lb (80.7 kg)  10/01/23 175 lb (79.4 kg)     Exam: General: Pt appears well and is in NAD  Lungs: Clear with good BS bilat  Heart: RRR   Extremities: No pretibial edema.   Neuro: MS is good with appropriate affect, pt is alert and Ox3       DM Foot Exam 05/16/2023  The skin of the feet is intact without sores or ulcerations. The pedal pulses are 1+ on right and 1+ on left. The sensation is intact to a screening 5.07, 10 gram monofilament bilaterally    DATA REVIEWED:  Lab Results  Component Value Date   HGBA1C 6.7 (A) 05/16/2023   HGBA1C 7.0 (A) 11/12/2022   HGBA1C 7.5 (A)  05/13/2022    Latest Reference Range & Units 10/21/23 14:57  Sodium 135 - 145 mEq/L 139  Potassium 3.5 - 5.1 mEq/L 4.2  Chloride 96 - 112 mEq/L 104  CO2 19 - 32 mEq/L 26  Glucose 70 - 99 mg/dL 811 (H)  BUN 6 - 23 mg/dL 15  Creatinine 9.14 - 7.82 mg/dL 9.56  Calcium 8.4 - 21.3 mg/dL 9.2  GFR >08.65 mL/min 63.74  (H): Data is abnormally high  ISLET CELL ANTIBODY SCREEN NEGATIVE NEGATIVE    Glutamic Acid Decarb Ab <5 IU/mL <5      ASSESSMENT / PLAN / RECOMMENDATIONS:   1) Type 2 Diabetes Mellitus, Optimally controlled, With macrovascular  complications - Most recent A1c of 6.7%. Goal A1c < 7.0 %.     - A1c at goal  - He had to stop the [place] due to severe genital infection/irritation -He had declined GLP-1 agonist in the past -In the past he had declined CGM technology, but today my assistant applied Dexcom sensor and a receiver to try for the next 10 days and if he likes his technology I will be happy to prescribe it for him -No changes    MEDICATIONS:  - Continue Metformin 1000 mg, 1 tablet BID - Continue  Lantus  18 units daily  - Continue Novolog 4 units with lunch and 8 units with supper  -Continue HQ:IONGEXBM (BG- 135/40)  EDUCATION / INSTRUCTIONS: BG monitoring instructions: Patient is instructed to check his blood sugars 3 times a day, before meals  Call [place] if: BG persistently < 100  I reviewed the Rule of 15 for the treatment of hypoglycemia in detail with  the patient. Literature supplied.     2) Diabetic complications:  Eye: Does not have known diabetic retinopathy.  Neuro/ Feet: Does not have known diabetic peripheral neuropathy .  Renal: Patient does not have known baseline CKD. He   is  on an ACEI/ARB at present.   F/U in 6 months    Signed electronically by: Lyndle Herrlich, MD  [facility] Endocrinology  [facility] [address] [place] [place], [place] [address] Phone: [phone] FAX:  [phone]   CC: Wanda Plump, MD [address] [facility] [address] [place] [address] Phone: [phone]  Fax: [phone]  Return to Endocrinology clinic as below: Future Appointments  Date Time Provider Department Center  11/17/2023 10:50 AM Dalilah Curlin, Konrad Dolores, MD [facility] None  01/26/2024 10:10 AM [facility] ECHO 6 [id number]  02/18/2024 11:00 AM Wanda Plump, MD [facility]  03/31/2024 10:00 AM [facility] None  03/31/2024 10:15 AM Myna Hidalgo, Rose Phi, MD [facility] None  04/13/2024  8:20 AM [facility] ANNUAL WELLNESS VISIT 1 [facility]

## 2023-11-17 NOTE — Patient Instructions (Signed)
-   Continue  Metformin 1000 mg, 1 tablet TWICE a day  - Continue  Lantus 18 units daily  - Continue  NOVOLOG 4 units with Lunch and 8 units with Supper  - Novlog correctional insulin: ADD extra units on insulin to your meal-time Novolog  dose if your blood sugars are higher than 185. Use the scale below to help guide you:    Blood sugar before meal Number of units to inject  Less than 185 0 unit  186 - 225 1 units  226 -  265 2 units  265 -  305 3 units  306 -  345 4 units

## 2023-11-17 NOTE — Progress Notes (Signed)
 Name: [name]  Age/ Sex: 73 y.o., male   MRN/ DOB: [id number], 1950/09/28     PCP: Wanda Plump, MD   Reason for Endocrinology Evaluation: Type 2 Diabetes Mellitus  Initial Endocrine Consultative Visit: 04/19/2020    PATIENT IDENTIFIER: [name] is a 73 y.o. male with a past medical history of COPD, non-obstructive CAD, severe AS ( S/P AVR/CABG), CHF, HTN , T2DM and OSA. The patient has followed with Endocrinology clinic since 04/19/2020 for consultative assistance with management of his diabetes.  DIABETIC HISTORY:  [name] was diagnosed with DM in 2009. He has been on metformin and insulin for years.Metfromin dose was reduced by 50% due to hypoglycemia per pt.  His hemoglobin A1c has ranged from 7.2% in 2021, peaking at 8.8%in 2018  On his initial visit to our clinic his A1c was 7.2 % We adjusted MDI regimen and continued metformin   Due to a low BMI of 20.99 we checked GAD-65 and islet cell Ab's which were negative    Prandial insulin stopped 10/2020 but by  01/2021 he was already back on it and we continued   Marcelline Deist stopped due to genital rash 01/2021  SUBJECTIVE:   During the last visit (05/16/2023): A1c 6.7%   Today (11/17/2023): [name] is here for a follow up on diabetes. He checks his blood sugars a 2-3  times daily. The patient has not  had hypoglycemic episodes since the last clinic visit.  Patient follows with cardiology for CAD, ischemic cardiomyopathy Continues to follow with hematology for polycythemia vera  He continues to follow-up with pulmonary for COPD   Denies nausea, vomiting  Denies constipation or diarrhea    HOME DIABETES REGIMEN:  Lantus 18 units daily  Metformin 1000 mg , BID Novolog 4 units with lunch and 8 with supper  [id number] (BG- 135/40)     Statin: yes ACE-I/ARB: yes   METER DOWNLOAD SUMMARY: Prodigy unable to download  89-135  DIABETIC COMPLICATIONS: Microvascular complications:   Retinopathy Denies: CKD , neuropathy  Last eye exam: Completed 09/25/2023   Macrovascular complications:  CAD Denies: PVD, CVA   HISTORY:  Past Medical History:  Past Medical History:  Diagnosis Date   Abnormal myocardial perfusion study 2006   EF 44% ? inferoseptal ischemia. no cath done   Allergy    Benign neoplasm of cecum    Breast mass, right 03/2008   Cardiomyopathy (HCC) 07/20/2018   Ejection fraction 4045% in September 2019   Centrilobular emphysema (HCC) 08/25/2018   02/04/2018-high-res CT- no evidence of interstitial lung disease, severe centrilobular emphysema and mild diffuse bronchial wall thickening suggesting COPD, no calcified pleural plaques no pleural effusion, mild smooth bilateral pleural thickening which is nonspecific  02/04/2018-pulmonary function test- FVC 3.37 (71% predicted), postbronchodilator ratio 50, postbronchodilator FEV1 54, mid flow reve   CHF (congestive heart failure) (HCC)    COLONIC POLYPS, HX OF 03/18/2007   Qualifier: Diagnosis of  By: Nena Jordan    Contact dermatitis 01/31/2013   COPD (chronic obstructive pulmonary disease) (HCC)    "dx'd 12/2017"   COPD GOLD II A  08/25/2018   02/04/2018-high-res CT- no evidence of interstitial lung disease, severe centrilobular emphysema and mild diffuse bronchial wall thickening suggesting COPD, no calcified pleural plaques no pleural effusion, mild smooth bilateral pleural thickening which is nonspecific  02/04/2018-pulmonary function test- FVC 3.37 (71% predicted), postbronchodilator ratio 50, postbronchodilator FEV1 54, mid flow reve   Coronary artery disease    Critical  aortic valve stenosis 01/12/2018   Calculated valve area less than 0.5 cm mean gradient more than 60 mm of mercury   DEGENERATIVE JOINT DISEASE, BACK 03/18/2007   Qualifier: Diagnosis of  By: Nena Jordan    Depressed left ventricular ejection fraction 03/28/2020   Diabetes mellitus (HCC) 07/25/2020   Dyslipidemia 04/20/2020    Dyspnea on exertion 01/12/2018   Emphysema of lung (HCC)    ERECTILE DYSFUNCTION, ORGANIC 02/10/2009   Qualifier: Diagnosis of  By: Nena Jordan    Former cigarette smoker 10/12/2018   Quit in 2012 41-pack-year smoking history   Goals of care, counseling/discussion 08/24/2020   H/O aortic valve replacement 05/05/2020   Heart murmur    HEMORRHOIDS, INTERNAL 03/18/2007   Qualifier: Diagnosis of  By: Nena Jordan    HTN (hypertension) 10/21/2007        Hyperlipemia 10/21/2007   Qualifier: Diagnosis of  By: Nena Jordan    Hypertension    Hypoglycemia due to insulin 03/26/2020   Low back pain    MYOCARDIAL PERFUSION SCAN, WITH STRESS TEST, ABNORMAL 08/11/2008   Qualifier: Diagnosis of  By: Eden Emms, MD, Harrington Challenger    Nocturnal hypoxemia 08/25/2018   04/09/18 >>> ONO - telephone note>>> Patient's ONO on room air showed that he was less than 88% for more than an hour. Patient needs to be on 2 L at HS.  10/06/2018-overnight oximetry-entire duration was 4 hours and 23 minutes, SPO2 less than 88% for 25 minutes and 32 seconds     Oxygen deficiency    uses 2L at night   Paroxysmal atrial fibrillation (HCC) 10/12/2018   PCP NOTES >>>>>>>>>>>>>>>>> 05/14/2020   Polycythemia vera (HCC) 08/04/2020   S/P AVR 03/04/2018   AVR 23 mm Edwards Magna Valve - bioprostetic 03/04/2018 LIMA - LAD   Status post coronary artery bypass graft 03/20/2018   LIMA to LAD during aortic valve replacement surgery 03/04/2018   Type 2 diabetes mellitus with hyperglycemia, with long-term current use of insulin (HCC) 04/20/2020   Type 2 diabetes mellitus, uncontrolled, with neuropathy 05/20/2008   Qualifier: Diagnosis of  By: Nena Jordan    Past Surgical History:  Past Surgical History:  Procedure Laterality Date   ABDOMINAL AORTOGRAM N/A 01/13/2018   Procedure: ABDOMINAL AORTOGRAM;  Surgeon: Lennette Bihari, MD;  Location: [facility] INVASIVE CV LAB;  Service: Cardiovascular;  Laterality: N/A;    AORTIC VALVE REPLACEMENT N/A 03/04/2018   Procedure: AORTIC VALVE REPLACEMENT (AVR) 23mm Edwards Magna Ease Tissue Valve.;  Surgeon: Loreli Slot, MD;  Location: [facility] OR;  Service: Open Heart Surgery;  Laterality: N/A;   CARDIAC VALVE REPLACEMENT     COLONOSCOPY W/ POLYPECTOMY     COLONOSCOPY WITH PROPOFOL N/A 02/22/2020   Procedure: COLONOSCOPY WITH PROPOFOL;  Surgeon: Sherrilyn Rist, MD;  Location: [facility] ENDOSCOPY;  Service: Gastroenterology;  Laterality: N/A;   CORONARY ARTERY BYPASS GRAFT N/A 03/04/2018   Procedure: CORONARY ARTERY BYPASS GRAFTING (CABG) x1:  LIMA to LAD.;  Surgeon: Loreli Slot, MD;  [facility];  Service: Open Heart Surgery;  Laterality: N/A;   CORONARY PRESSURE/FFR STUDY N/A 05/10/2020   Procedure: INTRAVASCULAR PRESSURE WIRE/FFR STUDY;  Surgeon: Tonny Bollman, MD;  Location: [facility] INVASIVE CV LAB;  Service: Cardiovascular;  Laterality: N/A;   HEMOSTASIS CLIP PLACEMENT  02/22/2020   Procedure: HEMOSTASIS CLIP PLACEMENT;  Surgeon: Sherrilyn Rist, MD;  Location: [facility] ENDOSCOPY;  Service: Gastroenterology;;   LEFT HEART  CATH AND CORS/GRAFTS ANGIOGRAPHY N/A 05/10/2020   Procedure: LEFT HEART CATH AND CORS/GRAFTS ANGIOGRAPHY;  Surgeon: Tonny Bollman, MD;  Location: [facility] INVASIVE CV LAB;  Service: Cardiovascular;  Laterality: N/A;   LIPOMA EXCISION Left 07/2008    "back of shoulder" Dr Harlon Flor   POLYPECTOMY  02/22/2020   Procedure: POLYPECTOMY;  Surgeon: Sherrilyn Rist, MD;  Location: [facility] ENDOSCOPY;  Service: Gastroenterology;;   RIGHT/LEFT HEART CATH AND CORONARY ANGIOGRAPHY N/A 01/13/2018   Procedure: RIGHT/LEFT HEART CATH AND CORONARY ANGIOGRAPHY;  Surgeon: Lennette Bihari, MD;  Location: [facility] INVASIVE CV LAB;  Service: Cardiovascular;  Laterality: N/A;   TEE WITHOUT CARDIOVERSION N/A 03/04/2018   Procedure: TRANSESOPHAGEAL ECHOCARDIOGRAM (TEE);  Surgeon: Loreli Slot, MD;  Location: [facility] OR;  Service: Open Heart Surgery;  Laterality: N/A;   TONSILLECTOMY      WISDOM TOOTH EXTRACTION     Social History:  reports that he quit smoking about 25 years ago. His smoking use included cigarettes. He started smoking about 52 years ago. He has a 40.5 pack-year smoking history. He has never used smokeless tobacco. He reports current alcohol use of about 2.0 standard drinks of alcohol per week. He reports that he does not currently use drugs. Family History:  Family History  Problem Relation Age of Onset   Melanoma Father 82       deceased secondary to melanoma   Alzheimer's disease Father    Diabetes Father    Hypertension Mother        alive -74   Colon cancer Neg Hx    Esophageal cancer Neg Hx    Colon polyps Neg Hx    Stomach cancer Neg Hx      HOME MEDICATIONS: Allergies as of 11/17/2023       Reactions   Farxiga [dapagliflozin] Rash, Other (See Comments)   "swollen penis"   Lisinopril Other (See Comments)   Hyperkalemia   Amlodipine Besylate Other (See Comments)   REACTION: ? caused left axillary nodules/chest flutter   Hydrochlorothiazide Hives        Medication List        Accurate as of November 17, 2023  1:19 PM. If you have any questions, ask your nurse or doctor.          acetaminophen 500 MG tablet Commonly known as: TYLENOL Take 1,000 mg by mouth every 6 (six) hours as needed for moderate pain or headache.   amoxicillin 500 MG capsule Commonly known as: AMOXIL Take 4 tablets by mouth 1 hour before dental procedure.   aspirin EC 325 MG tablet Take 1 tablet (325 mg total) by mouth daily.   BD Pen Needle Nano 2nd Gen 32G X 4 MM Misc Generic drug: Insulin Pen Needle Inject 1 Device into the skin in the morning, at noon, in the evening, and at bedtime.   budesonide-formoterol 160-4.5 MCG/ACT inhaler Commonly known as: SYMBICORT Inhale 2 puffs into the lungs 2 (two) times daily.   cetirizine 10 MG tablet Commonly known as: ZYRTEC Take 10 mg by mouth daily as needed for allergies.   Entresto 97-103 MG Generic  drug: sacubitril-valsartan TAKE 1 TABLET BY MOUTH TWICE A DAY   glucose blood test strip Commonly known as: Prodigy No Coding Blood Gluc Use as instructed to check blood sugar twice a day.  DX  E11.40   hydrALAZINE 50 MG tablet Commonly known as: APRESOLINE TAKE 1 TABLET BY MOUTH THREE TIMES A DAY   isosorbide mononitrate 30 MG 24 hr tablet  Commonly known as: IMDUR TAKE 1 TABLET BY MOUTH EVERY DAY   Lantus SoloStar 100 UNIT/ML Solostar Pen Generic drug: insulin glargine Inject 18 Units into the skin daily.   metFORMIN 1000 MG tablet Commonly known as: GLUCOPHAGE Take 1 tablet (1,000 mg total) by mouth 2 (two) times daily with a meal.   metoprolol succinate 50 MG 24 hr tablet Commonly known as: TOPROL-XL TAKE 1 TABLET BY MOUTH EVERY DAY   NovoLOG FlexPen 100 UNIT/ML FlexPen Generic drug: insulin aspart Max daily 30 units   OXYGEN Inhale 2 L/min into the lungs continuous. Use at night   rosuvastatin 20 MG tablet Commonly known as: CRESTOR Take 1 tablet (20 mg total) by mouth daily.   spironolactone 25 MG tablet Commonly known as: ALDACTONE TAKE 1/2 TABLET BY MOUTH DAILY   VITAMIN B-12 PO Take 1 tablet by mouth daily.         OBJECTIVE:   Vital Signs: BP 134/86 (BP Location: Left Arm, Patient Position: Sitting, Cuff Size: Small)   Pulse 81   Ht 6\' 4"  (1.93 m)   Wt 178 lb (80.7 kg)   SpO2 92%   BMI 21.67 kg/m   Wt Readings from Last 3 Encounters:  11/17/23 178 lb (80.7 kg)  10/21/23 178 lb (80.7 kg)  10/02/23 178 lb (80.7 kg)     Exam: General: Pt appears well and is in NAD  Lungs: Clear with good BS bilat  Heart: RRR   Extremities: No pretibial edema.   Neuro: MS is good with appropriate affect, pt is alert and Ox3       DM Foot Exam 05/16/2023  The skin of the feet is intact without sores or ulcerations. The pedal pulses are 1+ on right and 1+ on left. The sensation is intact to a screening 5.07, 10 gram monofilament bilaterally    DATA  REVIEWED:  Lab Results  Component Value Date   HGBA1C 6.7 (A) 05/16/2023   HGBA1C 7.0 (A) 11/12/2022   HGBA1C 7.5 (A) 05/13/2022    Latest Reference Range & Units 10/21/23 14:57  Sodium 135 - 145 mEq/L 139  Potassium 3.5 - 5.1 mEq/L 4.2  Chloride 96 - 112 mEq/L 104  CO2 19 - 32 mEq/L 26  Glucose 70 - 99 mg/dL 161 (H)  BUN 6 - 23 mg/dL 15  Creatinine 0.96 - 0.45 mg/dL 4.09  Calcium 8.4 - 81.1 mg/dL 9.2  GFR >91.47 mL/min 63.74  (H): Data is abnormally high  ISLET CELL ANTIBODY SCREEN NEGATIVE NEGATIVE    Glutamic Acid Decarb Ab <5 IU/mL <5     Old records , labs and images have been reviewed.   ASSESSMENT / PLAN / RECOMMENDATIONS:   1) Type 2 Diabetes Mellitus, Optimally controlled, With macrovascular  complications - Most recent A1c of 6.6%. Goal A1c < 7.0 %.     - A1c at goal  - He had to stop the [place] due to severe genital infection/irritation -He had declined GLP-1 agonist in the past -In the past he had declined CGM technology -No changes    MEDICATIONS:  - Continue Metformin 1000 mg, 1 tablet BID - Continue  Lantus  18 units daily  - Continue Novolog 4 units with lunch and 8 units with supper  - Continue WG:[id number] (BG- 135/40)  EDUCATION / INSTRUCTIONS: BG monitoring instructions: Patient is instructed to check his blood sugars 3 times a day, before meals  Call [place] [facility] if: BG persistently < 100  I reviewed the Rule  of 15 for the treatment of hypoglycemia in detail with the patient. Literature supplied.     2) Diabetic complications:  Eye: Does not have known diabetic retinopathy.  Neuro/ Feet: Does not have known diabetic peripheral neuropathy .  Renal: Patient does not have known baseline CKD. He   is  on an ACEI/ARB at present.   F/U in 6 months    Signed electronically by: Lyndle Herrlich, MD  [facility] Endocrinology  [facility] [address] [place] [place], [place] [address] Phone:  [phone] FAX: [phone]   CC: Wanda Plump, MD [address] [facility] [address] [place] [address] Phone: [phone]  Fax: [phone]  Return to Endocrinology clinic as below: Future Appointments  Date Time Provider Department Center  11/17/2023  1:20 PM Kassandra Meriweather, Konrad Dolores, MD [facility] None  01/26/2024 10:10 AM [facility] ECHO 6 [id number]  02/18/2024 11:00 AM Wanda Plump, MD [facility]  03/31/2024 10:00 AM [facility] LAB [facility] None  03/31/2024 10:15 AM Myna Hidalgo, Rose Phi, MD [facility] None  04/13/2024  8:20 AM [facility] ANNUAL WELLNESS VISIT 1 [facility]

## 2023-11-29 DIAGNOSIS — J449 Chronic obstructive pulmonary disease, unspecified: Secondary | ICD-10-CM | POA: Diagnosis not present

## 2023-12-18 DIAGNOSIS — J449 Chronic obstructive pulmonary disease, unspecified: Secondary | ICD-10-CM | POA: Diagnosis not present

## 2023-12-29 DIAGNOSIS — J449 Chronic obstructive pulmonary disease, unspecified: Secondary | ICD-10-CM | POA: Diagnosis not present

## 2024-01-17 DIAGNOSIS — J449 Chronic obstructive pulmonary disease, unspecified: Secondary | ICD-10-CM | POA: Diagnosis not present

## 2024-01-26 ENCOUNTER — Ambulatory Visit (HOSPITAL_COMMUNITY)
Admission: RE | Admit: 2024-01-26 | Discharge: 2024-01-26 | Disposition: A | Discharge status: Home / Self Care | Payer: Medicare PPO | Source: Ambulatory Visit | Attending: Cardiovascular Disease | Admitting: Cardiovascular Disease

## 2024-01-26 DIAGNOSIS — T82857A Stenosis of cardiac prosthetic devices, implants and grafts, initial encounter: Secondary | ICD-10-CM

## 2024-01-26 DIAGNOSIS — I517 Cardiomegaly: Secondary | ICD-10-CM

## 2024-01-26 DIAGNOSIS — I350 Nonrheumatic aortic (valve) stenosis: Secondary | ICD-10-CM | POA: Diagnosis not present

## 2024-01-26 DIAGNOSIS — I5030 Unspecified diastolic (congestive) heart failure: Secondary | ICD-10-CM | POA: Diagnosis not present

## 2024-01-26 LAB — ECHOCARDIOGRAM COMPLETE
AR max vel: 0.87 cm2
AV Area VTI: 0.94 cm2
AV Area mean vel: 0.91 cm2
AV Mean grad: 27 mmHg
AV Peak grad: 49 mmHg
Ao pk vel: 3.5 m/s
Area-P 1/2: 2.78 cm2
S' Lateral: 2.6 cm

## 2024-01-27 NOTE — Progress Notes (Unsigned)
 [facility]   MULTIDISCIPLINARY HEART VALVE CLINIC                                     Cardiology Office Note:    Date:  01/29/2024   ID:  BRANDN MCGATH, DOB July 26, 1951, MRN [id number]  PCP:  Ezell Hollow, MD  [facility] Cardiologist:  Jerryl Morin, DO  [facility] Structural heart: Arnoldo Lapping, MD [facility] Electrophysiologist:  None   Referring MD: Ezell Hollow, MD   Follow up of aortic stenosis   History of Present Illness:    [name] is a 73 y.o. male with a hx of CAD/aortic stenosis s/p CABG x1V/AVR 2019, COPD, HTN, HLD, DMT2, polycythemia vera, and bioprosthetic valve stenosis who presents to clinic for follow up.   The patient underwent CABG/AVR in 2019 when he was treated with a 23 mm [facility] Ease bovine pericardial valve and single-vessel CABG with a LIMA to LAD graft. His presentation at that time included near syncope and pulmonary edema. An echocardiogram showed critical aortic stenosis with a mean transvalvular gradient of 60 mmHg and valve area less than 0.5 cm. EF was reduced at 35 to 40%. Cardiac catheterization 2021 noted two-vessel CAD with moderate stenoses in the proximal LAD and proximal ramus branch. Both lesions were interrogated with pressure wire analysis and were negative for ischemia. Medical therapy recommended. In 2022, an echo demonstrated an LVEF of 40 to 45% with a mean trans valvular gradient of 17 mmHg. 1 year later in 2023 EF 50-55% with aortic mean gradient of 15 mmHg and AVA 1.48 cm.   The patient has complained of progressive fatigue and shortness of breath for 6 months-1 year. Echo 07/01/23 showed EF 60-65% but raised concern for prosthetic aortic stenosis with worsening gradients with peak and mean transvalvular gradients of 49 and 26 mmHg, respectively. The calculated aortic valve area was 0.85 cm, DVI 0.23. He was seen by Dr. Arlester Ladd on 07/28/23 for consultation and felt to have moderate AS and did not  meet indication for consideration of redo valve replacement or transcatheter valve in valve replacement. 6 month follow up with echo was recommended.   Echo 01/26/24 showed EF 55%, moderate stenosis of the aortic prosthesis with mean gradient measures 27.0 mmHg, Vmax measures 3.50 m/s. Aortic valve acceleration time measures 98 msec, AVA 0.91, DVI 27, SVI 31, similar to previous study.   Today the patient presents to clinic for follow up. He is here with his wife. He used to be able to mow the lawn and weed eat with no issues but now can hardly do it. He has to stop and catch his breath several times. No CP. No LE edema, orthopnea or PND. He has some dizziness when making his bed. He has not had any syncope. No blood in stool or urine. No palpitations.     Past Medical History:  Diagnosis Date   Abnormal myocardial perfusion study 2006   EF 44% ? inferoseptal ischemia. no cath done   Allergy    Benign neoplasm of cecum    Breast mass, right 03/2008   Cardiomyopathy (HCC) 07/20/2018   Ejection fraction 4045% in September 2019   Centrilobular emphysema (HCC) 08/25/2018   02/04/2018-high-res CT- no evidence of interstitial lung disease, severe centrilobular emphysema and mild diffuse bronchial wall thickening suggesting COPD, no calcified pleural plaques no pleural effusion, mild  smooth bilateral pleural thickening which is nonspecific  02/04/2018-pulmonary function test- FVC 3.37 (71% predicted), postbronchodilator ratio 50, postbronchodilator FEV1 54, mid flow reve   CHF (congestive heart failure) (HCC)    COLONIC POLYPS, HX OF 03/18/2007   Qualifier: Diagnosis of  By: Marthe Slain    Contact dermatitis 01/31/2013   COPD (chronic obstructive pulmonary disease) (HCC)    "dx'd 12/2017"   COPD GOLD II A  08/25/2018   02/04/2018-high-res CT- no evidence of interstitial lung disease, severe centrilobular emphysema and mild diffuse bronchial wall thickening suggesting COPD, no calcified pleural  plaques no pleural effusion, mild smooth bilateral pleural thickening which is nonspecific  02/04/2018-pulmonary function test- FVC 3.37 (71% predicted), postbronchodilator ratio 50, postbronchodilator FEV1 54, mid flow reve   Coronary artery disease    Critical aortic valve stenosis 01/12/2018   Calculated valve area less than 0.5 cm mean gradient more than 60 mm of mercury   DEGENERATIVE JOINT DISEASE, BACK 03/18/2007   Qualifier: Diagnosis of  By: Marthe Slain    Depressed left ventricular ejection fraction 03/28/2020   Diabetes mellitus (HCC) 07/25/2020   Dyslipidemia 04/20/2020   Dyspnea on exertion 01/12/2018   Emphysema of lung (HCC)    ERECTILE DYSFUNCTION, ORGANIC 02/10/2009   Qualifier: Diagnosis of  By: Marthe Slain    Former cigarette smoker 10/12/2018   Quit in 2012 41-pack-year smoking history   Goals of care, counseling/discussion 08/24/2020   H/O aortic valve replacement 05/05/2020   Heart murmur    HEMORRHOIDS, INTERNAL 03/18/2007   Qualifier: Diagnosis of  By: Marthe Slain    HTN (hypertension) 10/21/2007        Hyperlipemia 10/21/2007   Qualifier: Diagnosis of  By: Marthe Slain    Hypertension    Hypoglycemia due to insulin  03/26/2020   Low back pain    MYOCARDIAL PERFUSION SCAN, WITH STRESS TEST, ABNORMAL 08/11/2008   Qualifier: Diagnosis of  By: Stann Earnest, MD, Lovena Rubinstein    Nocturnal hypoxemia 08/25/2018   04/09/18 >>> ONO - telephone note>>> Patient's ONO on room air showed that he was less than 88% for more than an hour. Patient needs to be on 2 L at HS.  10/06/2018-overnight oximetry-entire duration was 4 hours and 23 minutes, SPO2 less than 88% for 25 minutes and 32 seconds     Oxygen  deficiency    uses 2L at night   Paroxysmal atrial fibrillation (HCC) 10/12/2018   PCP NOTES >>>>>>>>>>>>>>>>> 05/14/2020   Polycythemia vera (HCC) 08/04/2020   S/P AVR 03/04/2018   AVR 23 mm Edwards Magna Valve - bioprostetic 03/04/2018 LIMA - LAD    Status post coronary artery bypass graft 03/20/2018   LIMA to LAD during aortic valve replacement surgery 03/04/2018   Type 2 diabetes mellitus with hyperglycemia, with long-term current use of insulin  (HCC) 04/20/2020   Type 2 diabetes mellitus, uncontrolled, with neuropathy 05/20/2008   Qualifier: Diagnosis of  By: Marthe Slain      Current Medications: Current Meds  Medication Sig   acetaminophen  (TYLENOL ) 500 MG tablet Take 1,000 mg by mouth every 6 (six) hours as needed for moderate pain or headache.   amoxicillin  (AMOXIL ) 500 MG capsule Take 4 tablets by mouth 1 hour before dental procedure.   aspirin  EC 81 MG tablet Take 1 tablet (81 mg total) by mouth daily. Swallow whole.   budesonide -formoterol  (SYMBICORT ) 160-4.5 MCG/ACT inhaler Inhale 2 puffs into the lungs 2 (two) times daily.  cetirizine  (ZYRTEC ) 10 MG tablet Take 10 mg by mouth daily as needed for allergies.   Cyanocobalamin  (VITAMIN B-12 PO) Take 1 tablet by mouth daily.   glucose blood (PRODIGY NO CODING BLOOD GLUC) test strip Use as instructed to check blood sugar twice a day.  DX  E11.40   hydrALAZINE  (APRESOLINE ) 50 MG tablet TAKE 1 TABLET BY MOUTH THREE TIMES A DAY   insulin  aspart (NOVOLOG  FLEXPEN) 100 UNIT/ML FlexPen Max daily 30 units   insulin  glargine (LANTUS  SOLOSTAR) 100 UNIT/ML Solostar Pen Inject 18 Units into the skin daily.   Insulin  Pen Needle (BD PEN NEEDLE NANO 2ND GEN) 32G X 4 MM MISC Inject 1 Device into the skin in the morning, at noon, in the evening, and at bedtime.   isosorbide  mononitrate (IMDUR ) 30 MG 24 hr tablet TAKE 1 TABLET BY MOUTH EVERY DAY   metFORMIN  (GLUCOPHAGE ) 1000 MG tablet Take 1 tablet (1,000 mg total) by mouth 2 (two) times daily with a meal.   metoprolol  succinate (TOPROL -XL) 50 MG 24 hr tablet TAKE 1 TABLET BY MOUTH EVERY DAY   OXYGEN  Inhale 2 L/min into the lungs continuous. Use at night   rosuvastatin  (CRESTOR ) 20 MG tablet Take 1 tablet (20 mg total) by mouth daily.    sacubitril-valsartan (ENTRESTO ) 97-103 MG TAKE 1 TABLET BY MOUTH TWICE A DAY   spironolactone  (ALDACTONE ) 25 MG tablet TAKE 1/2 TABLET BY MOUTH DAILY   [DISCONTINUED] aspirin  EC 325 MG EC tablet Take 1 tablet (325 mg total) by mouth daily.      ROS:   Please see the history of present illness.    All other systems reviewed and are negative.  EKGs       Risk Assessment/Calculations:           Physical Exam:    VS:  BP 136/80   Pulse 83   Ht 6\' 4"  (1.93 m)   Wt 174 lb (78.9 kg)   SpO2 90%   BMI 21.18 kg/m     Wt Readings from Last 3 Encounters:  01/29/24 174 lb (78.9 kg)  11/17/23 178 lb (80.7 kg)  10/21/23 178 lb (80.7 kg)     GEN: Well nourished, well developed in no acute distress NECK: No JVD CARDIAC: RRR, 2/6 systolic murmur heard best @ LUSB. No rubs, gallops RESPIRATORY:  Clear to auscultation without rales, wheezing or rhonchi  ABDOMEN: Soft, non-tender, non-distended EXTREMITIES:  No edema; No deformity.    ASSESSMENT:    1. Stenosis of prosthetic aortic valve, initial encounter     PLAN:    In order of problems listed above:  Bioprosthetic aortic valve stenosis: -- Previously evaluated by Dr. Arlester Ladd and not felt to meet criteria for valve replacement.  -- 6 month follow up echo is essentially unchanged from previous; but gradients substantially higher compared to 2020-2023.  -- Pt complains of progressive dyspnea on exertion as well as mild exertional dizziness.  -- NYHA class II symptoms.  -- Will get him set up for pre TAVR scans to assess for bioprosthetic leaflet thrombosis or early valve degeneration.  -- If CT shows HALT/HAM, will start OAC and repeat echo in 3 months.  -- If CT shows progressive valve degeneration, will discuss with valve team to see if VIV TAVR work up appropriate. He will need a L/RHC and surgical evaluation set up as a multidisciplinary approach to his care.  -- BMET, CBC, BNP today.    Medication Adjustments/Labs and  Tests Ordered: Current medicines  are reviewed at length with the patient today.  Concerns regarding medicines are outlined above.  Orders Placed This Encounter  Procedures   Basic metabolic panel with GFR   CBC   B Nat Peptide   Meds ordered this encounter  Medications   aspirin  EC 81 MG tablet    Sig: Take 1 tablet (81 mg total) by mouth daily. Swallow whole.    Patient Instructions  Medication Instructions:  Your physician has recommended you make the following change in your medication:  DECREASE ASPIRIN  TO 81 MG DAILY.   *If you need a refill on your cardiac medications before your next appointment, please call your pharmacy*  Lab Work: TODAY: BMET, CBC, BNP If you have labs (blood work) drawn today and your tests are completely normal, you will receive your results only by: MyChart Message (if you have MyChart) OR A paper copy in the mail If you have any lab test that is abnormal or we need to change your treatment, we will call you to review the results.  Testing/Procedures: SEE CT LETTER   Follow-Up: At [facility], you and your health needs are our priority.  As part of our continuing mission to provide you with exceptional heart care, our providers are all part of one team.  This team includes your primary Cardiologist (physician) and Advanced Practice Providers or APPs (Physician Assistants and Nurse Practitioners) who all work together to provide you with the care you need, when you need it.  Your next appointment:   AFTER PROCEDURE   We recommend signing up for the patient portal called "MyChart".  Sign up information is provided on this After Visit Summary.  MyChart is used to connect with patients for Virtual Visits (Telemedicine).  Patients are able to view lab/test results, encounter notes, upcoming appointments, etc.  Non-urgent messages can be sent to your provider as well.   To learn more about what you can do with MyChart, go to  [email or web address].          Signed, Abagail Hoar, PA-C  01/29/2024 4:52 PM    [facility]

## 2024-01-28 ENCOUNTER — Encounter: Payer: Self-pay | Admitting: Physician Assistant

## 2024-01-28 ENCOUNTER — Other Ambulatory Visit: Payer: Self-pay | Admitting: Physician Assistant

## 2024-01-28 DIAGNOSIS — T82857A Stenosis of cardiac prosthetic devices, implants and grafts, initial encounter: Secondary | ICD-10-CM

## 2024-01-29 ENCOUNTER — Encounter: Payer: Self-pay | Admitting: Physician Assistant

## 2024-01-29 ENCOUNTER — Ambulatory Visit: Attending: Physician Assistant | Admitting: Physician Assistant

## 2024-01-29 ENCOUNTER — Ambulatory Visit: Admitting: Physician Assistant

## 2024-01-29 ENCOUNTER — Ambulatory Visit: Payer: Self-pay | Admitting: Physician Assistant

## 2024-01-29 VITALS — BP 136/80 | HR 83 | Ht 76.0 in | Wt 174.0 lb

## 2024-01-29 DIAGNOSIS — T82857D Stenosis of cardiac prosthetic devices, implants and grafts, subsequent encounter: Secondary | ICD-10-CM

## 2024-01-29 DIAGNOSIS — T82857A Stenosis of cardiac prosthetic devices, implants and grafts, initial encounter: Secondary | ICD-10-CM

## 2024-01-29 DIAGNOSIS — J449 Chronic obstructive pulmonary disease, unspecified: Secondary | ICD-10-CM | POA: Diagnosis not present

## 2024-01-29 MED ORDER — ASPIRIN 81 MG PO TBEC: 81.0000 mg | DELAYED_RELEASE_TABLET | Freq: Every day | ORAL | Status: DC

## 2024-01-29 NOTE — Patient Instructions (Signed)
 Medication Instructions:  Your physician has recommended you make the following change in your medication:  DECREASE ASPIRIN  TO 81 MG DAILY.   *If you need a refill on your cardiac medications before your next appointment, please call your pharmacy*  Lab Work: TODAY: BMET, CBC, BNP If you have labs (blood work) drawn today and your tests are completely normal, you will receive your results only by: MyChart Message (if you have MyChart) OR A paper copy in the mail If you have any lab test that is abnormal or we need to change your treatment, we will call you to review the results.  Testing/Procedures: SEE CT LETTER   Follow-Up: At [facility], you and your health needs are our priority.  As part of our continuing mission to provide you with exceptional heart care, our providers are all part of one team.  This team includes your primary Cardiologist (physician) and Advanced Practice Providers or APPs (Physician Assistants and Nurse Practitioners) who all work together to provide you with the care you need, when you need it.  Your next appointment:   AFTER PROCEDURE   We recommend signing up for the patient portal called "MyChart".  Sign up information is provided on this After Visit Summary.  MyChart is used to connect with patients for Virtual Visits (Telemedicine).  Patients are able to view lab/test results, encounter notes, upcoming appointments, etc.  Non-urgent messages can be sent to your provider as well.   To learn more about what you can do with MyChart, go to [email or web address].

## 2024-01-29 NOTE — Progress Notes (Signed)
 Pre Surgical Assessment: 5 M Walk Test  74M=16.5ft  5 Meter Walk Test- trial 1: 6.63 seconds 5 Meter Walk Test- trial 2: 5.30 seconds 5 Meter Walk Test- trial 3: 5.81 seconds 5 Meter Walk Test Average: 5.91 seconds

## 2024-01-30 ENCOUNTER — Ambulatory Visit: Payer: Self-pay | Admitting: Physician Assistant

## 2024-01-30 LAB — BRAIN NATRIURETIC PEPTIDE: BNP: 48.6 pg/mL (ref 0.0–100.0)

## 2024-01-30 LAB — CBC
Hematocrit: 46.8 % (ref 37.5–51.0)
Hemoglobin: 12.4 g/dL — ABNORMAL LOW (ref 13.0–17.7)
MCH: 16.9 pg — ABNORMAL LOW (ref 26.6–33.0)
MCHC: 26.5 g/dL — ABNORMAL LOW (ref 31.5–35.7)
MCV: 64 fL — ABNORMAL LOW (ref 79–97)
Platelets: 416 10*3/uL (ref 150–450)
RBC: 7.33 x10E6/uL (ref 4.14–5.80)
RDW: 24.2 % — ABNORMAL HIGH (ref 11.6–15.4)
WBC: 6.9 10*3/uL (ref 3.4–10.8)

## 2024-01-30 LAB — BASIC METABOLIC PANEL WITH GFR
BUN/Creatinine Ratio: 13 (ref 10–24)
BUN: 13 mg/dL (ref 8–27)
CO2: 23 mmol/L (ref 20–29)
Calcium: 9.6 mg/dL (ref 8.6–10.2)
Chloride: 103 mmol/L (ref 96–106)
Creatinine, Ser: 1 mg/dL (ref 0.76–1.27)
Glucose: 175 mg/dL — ABNORMAL HIGH (ref 70–99)
Potassium: 5.1 mmol/L (ref 3.5–5.2)
Sodium: 140 mmol/L (ref 134–144)
eGFR: 80 mL/min/{1.73_m2} (ref >59)

## 2024-02-10 ENCOUNTER — Ambulatory Visit (HOSPITAL_COMMUNITY)
Admission: RE | Admit: 2024-02-10 | Discharge: 2024-02-10 | Disposition: A | Discharge status: Home / Self Care | Source: Ambulatory Visit | Attending: Physician Assistant | Admitting: Physician Assistant

## 2024-02-10 DIAGNOSIS — J432 Centrilobular emphysema: Secondary | ICD-10-CM | POA: Diagnosis not present

## 2024-02-10 DIAGNOSIS — T82857A Stenosis of cardiac prosthetic devices, implants and grafts, initial encounter: Secondary | ICD-10-CM

## 2024-02-10 DIAGNOSIS — J439 Emphysema, unspecified: Secondary | ICD-10-CM | POA: Diagnosis not present

## 2024-02-10 DIAGNOSIS — R9430 Abnormal result of cardiovascular function study, unspecified: Secondary | ICD-10-CM

## 2024-02-10 DIAGNOSIS — I70202 Unspecified atherosclerosis of native arteries of extremities, left leg: Secondary | ICD-10-CM | POA: Diagnosis not present

## 2024-02-10 DIAGNOSIS — I700 Atherosclerosis of aorta: Secondary | ICD-10-CM | POA: Insufficient documentation

## 2024-02-10 DIAGNOSIS — I350 Nonrheumatic aortic (valve) stenosis: Secondary | ICD-10-CM | POA: Diagnosis not present

## 2024-02-10 DIAGNOSIS — Q272 Other congenital malformations of renal artery: Secondary | ICD-10-CM | POA: Diagnosis not present

## 2024-02-10 MED ORDER — IOHEXOL 350 MG/ML SOLN
100.0000 mL | Freq: Once | INTRAVENOUS | Status: AC | PRN
Start: 2024-02-10 — End: 2024-02-10
  Administered 2024-02-10: 100 mL via INTRAVENOUS

## 2024-02-11 ENCOUNTER — Ambulatory Visit: Payer: Self-pay | Admitting: Physician Assistant

## 2024-02-11 DIAGNOSIS — T82857A Stenosis of cardiac prosthetic devices, implants and grafts, initial encounter: Secondary | ICD-10-CM

## 2024-02-13 ENCOUNTER — Telehealth: Payer: Self-pay | Admitting: Pharmacy Technician

## 2024-02-13 ENCOUNTER — Encounter: Payer: Self-pay | Admitting: Family

## 2024-02-13 ENCOUNTER — Other Ambulatory Visit (HOSPITAL_COMMUNITY): Payer: Self-pay

## 2024-02-13 MED ORDER — APIXABAN 5 MG PO TABS
5.0000 mg | ORAL_TABLET | Freq: Two times a day (BID) | ORAL | 2 refills | Status: DC
  Filled 2024-02-13: qty 60, 30d supply, fill #0
  Filled 2024-03-09: qty 60, 30d supply, fill #1
  Filled 2024-04-11: qty 60, 30d supply, fill #2

## 2024-02-13 NOTE — Telephone Encounter (Signed)
 Pharmacy Patient Advocate Encounter  Received notification from [employer] that Prior Authorization for Eliquis 5MG  has been APPROVED from 08/27/23 to 08/25/24   PA #/Case ID/Reference #: [id number]

## 2024-02-13 NOTE — Telephone Encounter (Signed)
 The patient has been notified of the result and verbalized understanding. All questions (if any) were answered. Eliquis 5mg  BID sent to pharmacy per Alston Jerry and message sent to [name] for 3 month echo to be scheduled. Pt agreed to stop aspirin .  Rosalita Combe, CMA 02/13/2024 2:22 PM

## 2024-02-13 NOTE — Telephone Encounter (Signed)
 Pharmacy Patient Advocate Encounter   Received notification from staff that prior authorization for Eliquis 5MG  is required/requested.   Insurance verification completed.   The patient is insured through [employer] .   Per test claim: PA required; PA submitted to above mentioned insurance via CoverMyMeds Key/confirmation #/EOC [id number] Status is pending

## 2024-02-15 ENCOUNTER — Other Ambulatory Visit: Payer: Self-pay | Admitting: Internal Medicine

## 2024-02-17 ENCOUNTER — Encounter: Payer: Self-pay | Admitting: Pharmacist

## 2024-02-17 DIAGNOSIS — J449 Chronic obstructive pulmonary disease, unspecified: Secondary | ICD-10-CM | POA: Diagnosis not present

## 2024-02-17 NOTE — Progress Notes (Signed)
 Pharmacy Quality Measure Review  This patient is appearing on a report for being at risk of failing the adherence measure for cholesterol (statin) medications this calendar year.   Medication: rosuvastatin   Last fill date: 09/23/2023 for 90 day supply per adherence report.   Per recent refill history patient actually refilled rosuvastatin  for 90 day supply last on 01/21/2024.   Also noticed that Eliquis  was prescribed but did not look like it had been filled. It initially needed prior authorization but prior authorization has been approved.  Spoke with patient and he has gotten Eliquis  filled and has started.  Discussed importance of taking Eliquis  as directed and discussed possible side effects and signs of bleeding to watch for and report.   Insurance report was not up to date. No action needed at this time.   Madelin Ray, PharmD Clinical Pharmacist [facility]  Population Health [phone]

## 2024-02-18 ENCOUNTER — Encounter: Payer: Medicare PPO | Admitting: Internal Medicine

## 2024-02-28 DIAGNOSIS — J449 Chronic obstructive pulmonary disease, unspecified: Secondary | ICD-10-CM | POA: Diagnosis not present

## 2024-03-06 ENCOUNTER — Other Ambulatory Visit: Payer: Self-pay | Admitting: Cardiology

## 2024-03-09 ENCOUNTER — Other Ambulatory Visit: Payer: Self-pay | Admitting: Internal Medicine

## 2024-03-09 ENCOUNTER — Telehealth: Payer: Self-pay | Admitting: Cardiology

## 2024-03-09 ENCOUNTER — Other Ambulatory Visit (HOSPITAL_COMMUNITY): Payer: Self-pay

## 2024-03-09 DIAGNOSIS — Z20828 Contact with and (suspected) exposure to other viral communicable diseases: Secondary | ICD-10-CM | POA: Diagnosis not present

## 2024-03-09 MED ORDER — SACUBITRIL-VALSARTAN 97-103 MG PO TABS: 1.0000 | ORAL_TABLET | Freq: Two times a day (BID) | ORAL | 2 refills | Status: AC

## 2024-03-09 NOTE — Telephone Encounter (Signed)
 Pt's medication was sent to pt's pharmacy as requested. Confirmation received.

## 2024-03-09 NOTE — Telephone Encounter (Signed)
*  STAT* If patient is at the pharmacy, call can be transferred to refill team.   1. Which medications need to be refilled? (please list name of each medication and dose if known)   sacubitril -valsartan  (ENTRESTO ) 97-103 MG   2. Would you like to learn more about the convenience, safety, & potential cost savings by using the [facility]?   3. Are you open to using the [facility] (Type [facility]. ).  4. Which pharmacy/location (including street and city if local pharmacy) is medication to be sent to?  [id number] - [place], [place] - [address]   5. Do they need a 30 day or 90 day supply?   90 day  Wife Adina) stated patient only has 5 tablets left.

## 2024-03-18 DIAGNOSIS — J449 Chronic obstructive pulmonary disease, unspecified: Secondary | ICD-10-CM | POA: Diagnosis not present

## 2024-03-30 DIAGNOSIS — J449 Chronic obstructive pulmonary disease, unspecified: Secondary | ICD-10-CM | POA: Diagnosis not present

## 2024-03-31 ENCOUNTER — Inpatient Hospital Stay: Payer: Medicare PPO | Admitting: Hematology & Oncology

## 2024-03-31 ENCOUNTER — Inpatient Hospital Stay: Payer: Medicare PPO | Attending: Family

## 2024-03-31 ENCOUNTER — Encounter: Payer: Self-pay | Admitting: Hematology & Oncology

## 2024-03-31 VITALS — BP 132/72 | HR 83 | Temp 98.1°F | Resp 20 | Ht 76.0 in | Wt 170.1 lb

## 2024-03-31 DIAGNOSIS — D500 Iron deficiency anemia secondary to blood loss (chronic): Secondary | ICD-10-CM

## 2024-03-31 DIAGNOSIS — D45 Polycythemia vera: Secondary | ICD-10-CM

## 2024-03-31 LAB — CBC WITH DIFFERENTIAL (CANCER CENTER ONLY)
Abs Granulocyte: 5.1 K/uL (ref 1.5–6.5)
Abs Immature Granulocytes: 0.05 K/uL (ref 0.00–0.07)
Basophils Absolute: 0 K/uL (ref 0.0–0.1)
Basophils Relative: 1 %
Eosinophils Absolute: 0.1 K/uL (ref 0.0–0.5)
Eosinophils Relative: 2 %
HCT: 39.4 % (ref 39.0–52.0)
Hemoglobin: 11.1 g/dL — ABNORMAL LOW (ref 13.0–17.0)
Immature Granulocytes: 1 %
Lymphocytes Relative: 19 %
Lymphs Abs: 1.4 K/uL (ref 0.7–4.0)
MCH: 16.5 pg — ABNORMAL LOW (ref 26.0–34.0)
MCHC: 28.2 g/dL — ABNORMAL LOW (ref 30.0–36.0)
MCV: 58.7 fL — ABNORMAL LOW (ref 80.0–100.0)
Monocytes Absolute: 0.7 K/uL (ref 0.1–1.0)
Monocytes Relative: 10 %
Neutro Abs: 5.1 K/uL (ref 1.7–7.7)
Neutrophils Relative %: 67 %
Platelet Count: 306 K/uL (ref 150–400)
RBC: 6.71 MIL/uL — ABNORMAL HIGH (ref 4.22–5.81)
RDW: 25.6 % — ABNORMAL HIGH (ref 11.5–15.5)
Smear Review: NORMAL
WBC Count: 7.5 K/uL (ref 4.0–10.5)
nRBC: 0 % (ref 0.0–0.2)

## 2024-03-31 LAB — CMP (CANCER CENTER ONLY)
ALT: 8 U/L (ref 0–44)
AST: 19 U/L (ref 15–41)
Albumin: 4.2 g/dL (ref 3.5–5.0)
Alkaline Phosphatase: 48 U/L (ref 38–126)
Anion gap: 12 (ref 5–15)
BUN: 15 mg/dL (ref 8–23)
CO2: 23 mmol/L (ref 22–32)
Calcium: 9.3 mg/dL (ref 8.9–10.3)
Chloride: 102 mmol/L (ref 98–111)
Creatinine: 1.06 mg/dL (ref 0.61–1.24)
GFR, Estimated: >60 mL/min (ref >60)
Glucose, Bld: 239 mg/dL — ABNORMAL HIGH (ref 70–99)
Potassium: 4.5 mmol/L (ref 3.5–5.1)
Sodium: 136 mmol/L (ref 135–145)
Total Bilirubin: 0.5 mg/dL (ref 0.0–1.2)
Total Protein: 7.1 g/dL (ref 6.5–8.1)

## 2024-03-31 LAB — IRON AND IRON BINDING CAPACITY (CC-WL,HP ONLY)
Iron: 16 ug/dL — ABNORMAL LOW (ref 45–182)
Saturation Ratios: 4 % — ABNORMAL LOW (ref 17.9–39.5)
TIBC: 424 ug/dL (ref 250–450)
UIBC: 408 ug/dL

## 2024-03-31 LAB — FERRITIN: Ferritin: 11 ng/mL — ABNORMAL LOW (ref 24–336)

## 2024-03-31 NOTE — Progress Notes (Signed)
 Hematology and Oncology Follow Up Visit  [name] [id number] Feb 14, 1951 73 y.o. 03/31/2024   Principle Diagnosis:  Polycythemia vera- JAK2 (+)   Current Therapy:        Phlebotomy to maintain hematocrit less than 45% Aspirin  325 mg PO daily - on full dose for cardiac issues   Interim History:  [name] is here today for follow-up.  We see him every 6 months.  Since we last saw him, has been doing quite well.  He really has had no problems.  He has had no complaints.  There has been no change in medications.  He is diabetic.  He is on insulin .  When we last saw him, his ferritin was 6 with an iron saturation of 6%.  He has had no pain.  There is been no tingling in the hands or feet.  He has had no change in bowel or bladder habits.  There has been no nausea or vomiting.  He and his wife will be going down to [facility], [place]  for the anniversary in October.  Currently, I would have to say that his performance status is probably ECOG 0.   Medications:  Allergies as of 03/31/2024       Reactions   Farxiga  [dapagliflozin ] Rash, Other (See Comments)   swollen penis   Lisinopril  Other (See Comments)   Hyperkalemia   Amlodipine Besylate Other (See Comments)   REACTION: ? caused left axillary nodules/chest flutter   Hydrochlorothiazide Hives        Medication List        Accurate as of March 31, 2024 11:10 AM. If you have any questions, ask your nurse or doctor.          acetaminophen  500 MG tablet Commonly known as: TYLENOL  Take 1,000 mg by mouth every 6 (six) hours as needed for moderate pain or headache.   amoxicillin  500 MG capsule Commonly known as: AMOXIL  Take 4 tablets by mouth 1 hour before dental procedure.   BD Pen Needle Nano 2nd Gen 32G X 4 MM Misc Generic drug: Insulin  Pen Needle Inject 1 Device into the skin in the morning, at noon, in the evening, and at bedtime.   budesonide -formoterol  160-4.5 MCG/ACT inhaler Commonly known as:  Symbicort  Inhale 2 puffs into the lungs in the morning and at bedtime.   cetirizine  10 MG tablet Commonly known as: ZYRTEC  Take 10 mg by mouth daily as needed for allergies.   Eliquis  5 MG Tabs tablet Generic drug: apixaban  Take 1 tablet (5 mg total) by mouth 2 (two) times daily.   hydrALAZINE  50 MG tablet Commonly known as: APRESOLINE  TAKE 1 TABLET BY MOUTH THREE TIMES A DAY   isosorbide  mononitrate 30 MG 24 hr tablet Commonly known as: IMDUR  TAKE 1 TABLET BY MOUTH EVERY DAY   Lantus  SoloStar 100 UNIT/ML Solostar Pen Generic drug: insulin  glargine Inject 18 Units into the skin daily.   metFORMIN  1000 MG tablet Commonly known as: GLUCOPHAGE  Take 1 tablet (1,000 mg total) by mouth 2 (two) times daily with a meal.   metoprolol  succinate 50 MG 24 hr tablet Commonly known as: TOPROL -XL TAKE 1 TABLET BY MOUTH EVERY DAY   NovoLOG  FlexPen 100 UNIT/ML FlexPen Generic drug: insulin  aspart Max daily 30 units What changed: how much to take   OXYGEN  Inhale 2 L/min into the lungs continuous. Use at night   Prodigy No Coding Blood Gluc test strip Generic drug: glucose blood Use as instructed to check blood sugar twice a  day.  DX  E11.40   rosuvastatin  20 MG tablet Commonly known as: CRESTOR  Take 1 tablet (20 mg total) by mouth daily.   sacubitril -valsartan  97-103 MG Commonly known as: Entresto  Take 1 tablet by mouth 2 (two) times daily.   spironolactone  25 MG tablet Commonly known as: ALDACTONE  TAKE 1/2 TABLET BY MOUTH DAILY   VITAMIN B-12 PO Take 1 tablet by mouth daily.        Allergies:  Allergies  Allergen Reactions   Farxiga  [Dapagliflozin ] Rash and Other (See Comments)    swollen penis   Lisinopril  Other (See Comments)    Hyperkalemia    Amlodipine Besylate Other (See Comments)    REACTION: ? caused left axillary nodules/chest flutter   Hydrochlorothiazide Hives    Past Medical History, Surgical history, Social history, and Family History were  reviewed and updated.  Review of Systems: Review of Systems  Constitutional: Negative.   HENT: Negative.    Eyes: Negative.   Respiratory: Negative.    Cardiovascular: Negative.   Gastrointestinal: Negative.   Genitourinary: Negative.   Musculoskeletal: Negative.   Skin: Negative.   Neurological: Negative.   Endo/Heme/Allergies: Negative.   Psychiatric/Behavioral: Negative.       Physical Exam:  height is 6' 4 (1.93 m) and weight is 170 lb 1.9 oz (77.2 kg). His oral temperature is 98.1 F (36.7 C). His blood pressure is 132/72 and his pulse is 83. His respiration is 20 and oxygen  saturation is 97%.   Wt Readings from Last 3 Encounters:  03/31/24 170 lb 1.9 oz (77.2 kg)  01/29/24 174 lb (78.9 kg)  11/17/23 178 lb (80.7 kg)   Physical Exam Vitals reviewed.  HENT:     Head: Normocephalic and atraumatic.  Eyes:     Pupils: Pupils are equal, round, and reactive to light.  Cardiovascular:     Rate and Rhythm: Normal rate and regular rhythm.     Heart sounds: Normal heart sounds.  Pulmonary:     Effort: Pulmonary effort is normal.     Breath sounds: Normal breath sounds.  Abdominal:     General: Bowel sounds are normal.     Palpations: Abdomen is soft.  Musculoskeletal:        General: No tenderness or deformity. Normal range of motion.     Cervical back: Normal range of motion.  Lymphadenopathy:     Cervical: No cervical adenopathy.  Skin:    General: Skin is warm and dry.     Findings: No erythema or rash.  Neurological:     Mental Status: He is alert and oriented to person, place, and time.  Psychiatric:        Behavior: Behavior normal.        Thought Content: Thought content normal.        Judgment: Judgment normal.      Lab Results  Component Value Date   WBC 7.5 03/31/2024   HGB 11.1 (L) 03/31/2024   HCT 39.4 03/31/2024   MCV 58.7 (L) 03/31/2024   PLT 306 03/31/2024   Lab Results  Component Value Date   FERRITIN 6 (L) 10/01/2023   IRON 23 (L)  10/01/2023   TIBC 416 10/01/2023   UIBC 393 (H) 10/01/2023   IRONPCTSAT 6 (L) 10/01/2023   Lab Results  Component Value Date   RETICCTPCT 1.1 08/30/2021   RBC 6.71 (H) 03/31/2024   No results found for: KPAFRELGTCHN, LAMBDASER, KAPLAMBRATIO No results found for: IGGSERUM, IGA, IGMSERUM No results found for: TOTALPROTELP,  CARLOTA BENSON MARKEL EARLA JOANNIE DOC VICK, SPEI   Chemistry      Component Value Date/Time   NA 136 03/31/2024 1005   NA 140 01/29/2024 1351   K 4.5 03/31/2024 1005   CL 102 03/31/2024 1005   CO2 23 03/31/2024 1005   BUN 15 03/31/2024 1005   BUN 13 01/29/2024 1351   CREATININE 1.06 03/31/2024 1005   CREATININE 0.84 11/30/2013 0856      Component Value Date/Time   CALCIUM  9.3 03/31/2024 1005   ALKPHOS 48 03/31/2024 1005   AST 19 03/31/2024 1005   ALT 8 03/31/2024 1005   BILITOT 0.5 03/31/2024 1005       Impression and Plan: [name] is a very pleasant 73 yo African American gentleman with polycythemia vera, JAK 2 positive.   For right now, he does not need any phlebotomy.  I suspect that he probably still iron deficient.  We will still follow him up in 6 months.  I think this is very reasonable.  He will continue his aspirin .  We will see what his iron studies look like.  Maude JONELLE Crease, MD 8/6/202511:10 AM

## 2024-04-12 ENCOUNTER — Ambulatory Visit (INDEPENDENT_AMBULATORY_CARE_PROVIDER_SITE_OTHER): Admitting: Internal Medicine

## 2024-04-12 ENCOUNTER — Encounter: Payer: Self-pay | Admitting: Internal Medicine

## 2024-04-12 VITALS — BP 126/64 | HR 84 | Temp 97.9°F | Resp 18 | Ht 76.0 in | Wt 170.5 lb

## 2024-04-12 DIAGNOSIS — I420 Dilated cardiomyopathy: Secondary | ICD-10-CM

## 2024-04-12 DIAGNOSIS — D45 Polycythemia vera: Secondary | ICD-10-CM

## 2024-04-12 DIAGNOSIS — E119 Type 2 diabetes mellitus without complications: Secondary | ICD-10-CM | POA: Diagnosis not present

## 2024-04-12 DIAGNOSIS — Z0000 Encounter for general adult medical examination without abnormal findings: Secondary | ICD-10-CM | POA: Diagnosis not present

## 2024-04-12 DIAGNOSIS — E1165 Type 2 diabetes mellitus with hyperglycemia: Secondary | ICD-10-CM

## 2024-04-12 DIAGNOSIS — J449 Chronic obstructive pulmonary disease, unspecified: Secondary | ICD-10-CM

## 2024-04-12 DIAGNOSIS — Z0001 Encounter for general adult medical examination with abnormal findings: Secondary | ICD-10-CM | POA: Diagnosis not present

## 2024-04-12 DIAGNOSIS — Z794 Long term (current) use of insulin: Secondary | ICD-10-CM

## 2024-04-12 DIAGNOSIS — R195 Other fecal abnormalities: Secondary | ICD-10-CM | POA: Diagnosis not present

## 2024-04-12 DIAGNOSIS — I2510 Atherosclerotic heart disease of native coronary artery without angina pectoris: Secondary | ICD-10-CM | POA: Diagnosis not present

## 2024-04-12 DIAGNOSIS — I480 Paroxysmal atrial fibrillation: Secondary | ICD-10-CM

## 2024-04-12 DIAGNOSIS — Z7185 Encounter for immunization safety counseling: Secondary | ICD-10-CM

## 2024-04-12 NOTE — Progress Notes (Unsigned)
 Subjective:    Patient ID: [name], male    DOB: 26-Jan-1951, 73 y.o.   MRN: [id number]  DOS:  04/12/2024 Type of visit - description: CPX  Here for CPX Chronic medical problems addressed. Denies chest pain, lower extremity. Has some mild cough, SOB at baseline. When asked, admits to dark stools, since I started Eliquis . Denies nausea or vomiting.  No abdominal pain.   BP Readings from Last 3 Encounters:  04/12/24 126/64  03/31/24 132/72  01/29/24 136/80     Review of Systems See above   Past Medical History:  Diagnosis Date   Abnormal myocardial perfusion study 2006   EF 44% ? inferoseptal ischemia. no cath done   Allergy    Benign neoplasm of cecum    Breast mass, right 03/2008   Cardiomyopathy (HCC) 07/20/2018   Ejection fraction 4045% in September 2019   Centrilobular emphysema (HCC) 08/25/2018   02/04/2018-high-res CT- no evidence of interstitial lung disease, severe centrilobular emphysema and mild diffuse bronchial wall thickening suggesting COPD, no calcified pleural plaques no pleural effusion, mild smooth bilateral pleural thickening which is nonspecific  02/04/2018-pulmonary function test- FVC 3.37 (71% predicted), postbronchodilator ratio 50, postbronchodilator FEV1 54, mid flow reve   CHF (congestive heart failure) (HCC)    COLONIC POLYPS, HX OF 03/18/2007   Qualifier: Diagnosis of  By: [name]    Contact dermatitis 01/31/2013   COPD (chronic obstructive pulmonary disease) (HCC)    dx'd 12/2017   COPD GOLD II A  08/25/2018   02/04/2018-high-res CT- no evidence of interstitial lung disease, severe centrilobular emphysema and mild diffuse bronchial wall thickening suggesting COPD, no calcified pleural plaques no pleural effusion, mild smooth bilateral pleural thickening which is nonspecific  02/04/2018-pulmonary function test- FVC 3.37 (71% predicted), postbronchodilator ratio 50, postbronchodilator FEV1 54, mid flow reve   Coronary artery  disease    Critical aortic valve stenosis 01/12/2018   Calculated valve area less than 0.5 cm mean gradient more than 60 mm of mercury   DEGENERATIVE JOINT DISEASE, BACK 03/18/2007   Qualifier: Diagnosis of  By: [name]    Depressed left ventricular ejection fraction 03/28/2020   Diabetes mellitus (HCC) 07/25/2020   Dyslipidemia 04/20/2020   Dyspnea on exertion 01/12/2018   Emphysema of lung (HCC)    ERECTILE DYSFUNCTION, ORGANIC 02/10/2009   Qualifier: Diagnosis of  By: [name]    Former cigarette smoker 10/12/2018   Quit in 2012 41-pack-year smoking history   Goals of care, counseling/discussion 08/24/2020   H/O aortic valve replacement 05/05/2020   Heart murmur    HEMORRHOIDS, INTERNAL 03/18/2007   Qualifier: Diagnosis of  By: [name]    HTN (hypertension) 10/21/2007        Hyperlipemia 10/21/2007   Qualifier: Diagnosis of  By: [name]    Hypertension    Hypoglycemia due to insulin  03/26/2020   Low back pain    MYOCARDIAL PERFUSION SCAN, WITH STRESS TEST, ABNORMAL 08/11/2008   Qualifier: Diagnosis of  By: Delford, MD, CODY Maude Dunnings    Nocturnal hypoxemia 08/25/2018   04/09/18 >>> ONO - telephone note>>> Patient's ONO on room air showed that he was less than 88% for more than an hour. Patient needs to be on 2 L at HS.  10/06/2018-overnight oximetry-entire duration was 4 hours and 23 minutes, SPO2 less than 88% for 25 minutes and 32 seconds     Oxygen  deficiency    uses  2L at night   Paroxysmal atrial fibrillation (HCC) 10/12/2018   PCP NOTES >>>>>>>>>>>>>>>>> 05/14/2020   Polycythemia vera (HCC) 08/04/2020   S/P AVR 03/04/2018   AVR 23 mm Edwards Magna Valve - bioprostetic 03/04/2018 LIMA - LAD   Status post coronary artery bypass graft 03/20/2018   LIMA to LAD during aortic valve replacement surgery 03/04/2018   Type 2 diabetes mellitus with hyperglycemia, with long-term current use of insulin  (HCC) 04/20/2020   Type 2 diabetes  mellitus, uncontrolled, with neuropathy 05/20/2008   Qualifier: Diagnosis of  By: [name]     Past Surgical History:  Procedure Laterality Date   ABDOMINAL AORTOGRAM N/A 01/13/2018   Procedure: ABDOMINAL AORTOGRAM;  Surgeon: Burnard Debby LABOR, MD;  Location: [facility] INVASIVE CV LAB;  Service: Cardiovascular;  Laterality: N/A;   AORTIC VALVE REPLACEMENT N/A 03/04/2018   Procedure: AORTIC VALVE REPLACEMENT (AVR) 23mm Edwards Magna Ease Tissue Valve.;  Surgeon: Kerrin Elspeth BROCKS, MD;  Location: [facility] OR;  Service: Open Heart Surgery;  Laterality: N/A;   CARDIAC VALVE REPLACEMENT     COLONOSCOPY W/ POLYPECTOMY     COLONOSCOPY WITH PROPOFOL  N/A 02/22/2020   Procedure: COLONOSCOPY WITH PROPOFOL ;  Surgeon: Legrand Victory LITTIE DOUGLAS, MD;  Location: [facility] ENDOSCOPY;  Service: Gastroenterology;  Laterality: N/A;   CORONARY ARTERY BYPASS GRAFT N/A 03/04/2018   Procedure: CORONARY ARTERY BYPASS GRAFTING (CABG) x1:  LIMA to LAD.;  Surgeon: Kerrin Elspeth BROCKS, MD;  Location: [facility] OR;  Service: Open Heart Surgery;  Laterality: N/A;   CORONARY PRESSURE/FFR STUDY N/A 05/10/2020   Procedure: INTRAVASCULAR PRESSURE WIRE/FFR STUDY;  Surgeon: Wonda Sharper, MD;  Location: [facility] INVASIVE CV LAB;  Service: Cardiovascular;  Laterality: N/A;   HEMOSTASIS CLIP PLACEMENT  02/22/2020   Procedure: HEMOSTASIS CLIP PLACEMENT;  Surgeon: Legrand Victory LITTIE DOUGLAS, MD;  Location: [facility] ENDOSCOPY;  Service: Gastroenterology;;   LEFT HEART CATH AND CORS/GRAFTS ANGIOGRAPHY N/A 05/10/2020   Procedure: LEFT HEART CATH AND CORS/GRAFTS ANGIOGRAPHY;  Surgeon: Wonda Sharper, MD;  Location: [facility] INVASIVE CV LAB;  Service: Cardiovascular;  Laterality: N/A;   LIPOMA EXCISION Left 07/2008    back of shoulder Dr Nalani   POLYPECTOMY  02/22/2020   Procedure: POLYPECTOMY;  Surgeon: Legrand Victory LITTIE DOUGLAS, MD;  Location: [facility] ENDOSCOPY;  Service: Gastroenterology;;   RIGHT/LEFT HEART CATH AND CORONARY ANGIOGRAPHY N/A 01/13/2018   Procedure: RIGHT/LEFT HEART CATH AND  CORONARY ANGIOGRAPHY;  Surgeon: Burnard Debby LABOR, MD;  Location: [facility] INVASIVE CV LAB;  Service: Cardiovascular;  Laterality: N/A;   TEE WITHOUT CARDIOVERSION N/A 03/04/2018   Procedure: TRANSESOPHAGEAL ECHOCARDIOGRAM (TEE);  Surgeon: Kerrin Elspeth BROCKS, MD;  Location: [facility] OR;  Service: Open Heart Surgery;  Laterality: N/A;   TONSILLECTOMY     WISDOM TOOTH EXTRACTION      Current Outpatient Medications  Medication Instructions   acetaminophen  (TYLENOL ) 1,000 mg, Every 6 hours PRN   amoxicillin  (AMOXIL ) 500 MG capsule Take 4 tablets by mouth 1 hour before dental procedure.   budesonide -formoterol  (SYMBICORT ) 160-4.5 MCG/ACT inhaler 2 puffs, Inhalation, 2 times daily   cetirizine  (ZYRTEC ) 10 mg, Daily PRN   Cyanocobalamin  (VITAMIN B-12 PO) 1 tablet, Daily   Eliquis  5 mg, Oral, 2 times daily   glucose blood (PRODIGY NO CODING BLOOD GLUC) test strip Use as instructed to check blood sugar twice a day.  DX  E11.40   hydrALAZINE  (APRESOLINE ) 50 mg, Oral, 3 times daily   insulin  aspart (NOVOLOG  FLEXPEN) 100 UNIT/ML FlexPen Max daily 30 units   Insulin  Pen Needle (  BD PEN NEEDLE NANO 2ND GEN) 32G X 4 MM MISC 1 Device, Subcutaneous, 4 times daily   isosorbide  mononitrate (IMDUR ) 30 mg, Oral, Daily   Lantus  SoloStar 18 Units, Subcutaneous, Daily   metFORMIN  (GLUCOPHAGE ) 1,000 mg, Oral, 2 times daily with meals   metoprolol  succinate (TOPROL -XL) 50 mg, Oral, Daily   OXYGEN  2 L/min, Continuous   rosuvastatin  (CRESTOR ) 20 mg, Oral, Daily   sacubitril -valsartan  (ENTRESTO ) 97-103 MG 1 tablet, Oral, 2 times daily   spironolactone  (ALDACTONE ) 12.5 mg, Oral, Daily       Objective:   Physical Exam BP 126/64   Pulse 84   Temp 97.9 F (36.6 C) (Oral)   Resp 18   Ht 6' 4 (1.93 m)   Wt 170 lb 8 oz (77.3 kg)   SpO2 91%   BMI 20.75 kg/m  General:   Well developed, NAD, BMI noted.  HEENT:  Normocephalic . Face symmetric, atraumatic Lungs:  CTA B Normal respiratory effort, no intercostal  retractions, no accessory muscle use. Heart: RRR,  no murmur.  Abdomen:  Not distended, soft, non-tender. No rebound or rigidity.   Skin: Not pale. Not jaundice DRE: Normal prostate, brown stools, Hemoccult trace +? Lower extremities: no pretibial edema bilaterally  Neurologic:  alert & oriented X3.  Speech normal, gait appropriate for age and unassisted Psych--  Cognition and judgment appear intact.  Cooperative with normal attention span and concentration.  Behavior appropriate. No anxious or depressed appearing.     Assessment     Problem list (new patient 03/2020) DM HTN CAD, CABG and aortic valve replacement due to stenosis on 02/2018 Cardiomyopathy, cath 05/10/2020, Rx medical treatment COPD 02/04/2018-high-res CT- no evidence of interstitial lung disease, severe centrilobular emphysema  02/04/2018-pulmonary function test- FVC 3.37 (71% predicted),  mid flow reversibility after bronchodilator  Polycythemia vera, + JAK2 Dx [id number].  PLAN:  Here for CPX PNM 13: 2018.  PNM 23 2009, 2019.  PMN 20: 2025 Vaccines I recommend: Flu shot, COVID booster, RSV, Shingrix, Tdap. CCS: Had a colonoscopy  01/2020.  GI requested a office visit to decide next cscope.  Patient aware.   Prostate cancer screening: No symptoms, check PSA. Labs: Reviewed, will get a CBC micro PSA Healthcare power of attorney discussed. Smoked heavy from 1973 until 2000, does not qualify for screening  Other issues addressed today DM: Per Endo HTN: On hydralazine , Imdur , metoprolol , Entresto , Aldactone .  BPs in the office are okay, at home however they are slightly low  (106/73). Last BMP okay.  Continue monitoring. COPD, severe oxygen  dependent: O2 sat today 91%, encouraged the use portable oxygen  which is available to him. CAD, status post CABG, aortic valve replacement: Started on Eliquis  based on clots found on cardiac CT, per cardiology. Was rec to stop aspirin  but he is still doing it.  Recommend to stop  aspirin  also advised not to take any NSAID.  He verbalized understanding. Dark stools: When asked, admits to dark schools.  No other GI symptoms.  On rectal exam his stools are brown, no blood, no tarry stools.  Questionable + Hemoccult.  Plan: CBC, Hemoccult x 3 at home.  He has iron deficiency from polycythemia. Polycythemia JAK2 positive: Saw oncology 03/31/2024, next visit 6 months RTC 4 months

## 2024-04-12 NOTE — Patient Instructions (Signed)
 Vaccines I recommend: Flu shot COVID booster RSV Shingrix, tetanus shot  Stop aspirin  Bring back the stool tests in few days  Please reach out to gastroenterology to see about a colonoscopy. You can call them at [phone]   Check the  blood pressure regularly Blood pressure goal:  between 110/65 and  135/85. If it is consistently higher or lower, please notify your heart doctors. Definitely call if in addition to the low blood pressure you feel weak, dizzy.      GO TO THE LAB :  Get the blood work   Your results will be posted on MyChart with my comments  Next office visit for a checkup in 3 months Please make an appointment before you leave today

## 2024-04-13 ENCOUNTER — Encounter: Payer: Self-pay | Admitting: Gastroenterology

## 2024-04-13 ENCOUNTER — Ambulatory Visit (INDEPENDENT_AMBULATORY_CARE_PROVIDER_SITE_OTHER)

## 2024-04-13 ENCOUNTER — Ambulatory Visit: Payer: Self-pay

## 2024-04-13 DIAGNOSIS — R195 Other fecal abnormalities: Secondary | ICD-10-CM | POA: Diagnosis not present

## 2024-04-13 LAB — CBC WITH DIFFERENTIAL/PLATELET
Basophils Absolute: 0 K/uL (ref 0.0–0.1)
Basophils Relative: 0.5 % (ref 0.0–3.0)
Eosinophils Absolute: 0.1 K/uL (ref 0.0–0.7)
Eosinophils Relative: 1.2 % (ref 0.0–5.0)
HCT: 34.9 % — ABNORMAL LOW (ref 39.0–52.0)
Hemoglobin: 10.4 g/dL — ABNORMAL LOW (ref 13.0–17.0)
Lymphocytes Relative: 16 % (ref 12.0–46.0)
Lymphs Abs: 1.4 K/uL (ref 0.7–4.0)
MCHC: 29.8 g/dL — ABNORMAL LOW (ref 30.0–36.0)
MCV: 56.5 fl — ABNORMAL LOW (ref 78.0–100.0)
Monocytes Absolute: 0.7 K/uL (ref 0.1–1.0)
Monocytes Relative: 8 % (ref 3.0–12.0)
Neutro Abs: 6.3 K/uL (ref 1.4–7.7)
Neutrophils Relative %: 74.3 % (ref 43.0–77.0)
Platelets: 300 K/uL (ref 150.0–400.0)
RBC: 6.19 Mil/uL — ABNORMAL HIGH (ref 4.22–5.81)
RDW: 24 % — ABNORMAL HIGH (ref 11.5–15.5)
WBC: 8.4 K/uL (ref 4.0–10.5)

## 2024-04-13 LAB — IBC + FERRITIN
Ferritin: 5.3 ng/mL — ABNORMAL LOW (ref 22.0–322.0)
Iron: 18 ug/dL — ABNORMAL LOW (ref 42–165)
Saturation Ratios: 4.2 % — ABNORMAL LOW (ref 20.0–50.0)
TIBC: 432.6 ug/dL (ref 250.0–450.0)
Transferrin: 309 mg/dL (ref 212.0–360.0)

## 2024-04-13 LAB — PSA: PSA: 0.65 ng/mL (ref 0.10–4.00)

## 2024-04-13 NOTE — Assessment & Plan Note (Signed)
 Here for CPX Other issues addressed today DM: Per Endo HTN: On hydralazine , Imdur , metoprolol , Entresto , Aldactone .  BPs in the office are okay, at home however they are slightly low  (106/73). Last BMP okay.  Continue monitoring. COPD, severe oxygen  dependent: O2 sat today 91%, encouraged the use portable oxygen  which is available to him. CAD, status post CABG, aortic valve replacement: Started on Eliquis  based on clots found on cardiac CT, per cardiology. Was rec to stop aspirin  but he is still doing it.  Recommend to stop aspirin  also advised not to take any NSAID.  He verbalized understanding. Dark stools: When asked, admits to dark schools.  No other GI symptoms.  On rectal exam his stools are brown, no blood, no tarry stools.  Questionable + Hemoccult.  Plan: CBC, Hemoccult x 3 at home.  He has iron deficiency from polycythemia. Polycythemia JAK2 positive: Saw oncology 03/31/2024, next visit 6 months RTC 4 months

## 2024-04-13 NOTE — Assessment & Plan Note (Signed)
 Here for CPX PNM 13: 2018.  PNM 23 2009, 2019.  PMN 20: 2025 Vaccines I recommend: Flu shot, COVID booster, RSV, Shingrix, Tdap. CCS: Had a colonoscopy  01/2020.  GI requested a office visit to decide next cscope.  Patient aware.   Prostate cancer screening: No symptoms, check PSA. Labs: Reviewed, will get a CBC micro PSA Healthcare power of attorney discussed. Smoked heavy from 1973 until 2000, does not qualify for screening

## 2024-04-13 NOTE — Addendum Note (Signed)
 Addended by: Rayanna Matusik D on: 04/13/2024 11:50 AM   Modules accepted: Orders

## 2024-04-14 ENCOUNTER — Ambulatory Visit (INDEPENDENT_AMBULATORY_CARE_PROVIDER_SITE_OTHER): Admitting: *Deleted

## 2024-04-14 ENCOUNTER — Other Ambulatory Visit (HOSPITAL_COMMUNITY): Payer: Self-pay

## 2024-04-14 ENCOUNTER — Telehealth: Payer: Self-pay | Admitting: *Deleted

## 2024-04-14 VITALS — Ht 76.0 in | Wt 170.0 lb

## 2024-04-14 DIAGNOSIS — Z0000 Encounter for general adult medical examination without abnormal findings: Secondary | ICD-10-CM

## 2024-04-14 NOTE — Progress Notes (Signed)
 Subjective:   [name] is a 73 y.o. who presents for a Medicare Wellness preventive visit.  As a reminder, Annual Wellness Visits don't include a physical exam, and some assessments may be limited, especially if this visit is performed virtually. We may recommend an in-person follow-up visit with your provider if needed.  Visit Complete: Virtual I connected with  Ryan VEAR Fleet on 04/14/24 by a audio enabled telemedicine application and verified that I am speaking with the correct person using two identifiers.  Patient Location: Home  Provider Location: Office/Clinic  I discussed the limitations of evaluation and management by telemedicine. The patient expressed understanding and agreed to proceed.  Vital Signs: Because this visit was a virtual/telehealth visit, some criteria may be missing or patient reported. Any vitals not documented were not able to be obtained and vitals that have been documented are patient reported.  VideoDeclined- This patient declined Librarian, academic. Therefore the visit was completed with audio only.  Persons Participating in Visit: Patient.  AWV Questionnaire: No: Patient Medicare AWV questionnaire was not completed prior to this visit.  Cardiac Risk Factors include: advanced age (>75men, >51 women);male gender;dyslipidemia;hypertension;diabetes mellitus;Other (see comment), Risk factor comments: Afib, COPD     Objective:    Today's Vitals   04/14/24 1020  Weight: 170 lb (77.1 kg)  Height: 6' 4 (1.93 m)   Body mass index is 20.69 kg/m.     04/14/2024   10:50 AM 03/31/2024   10:34 AM 10/01/2023   12:09 PM 04/10/2023    8:28 AM 03/31/2023    1:29 PM 09/30/2022    1:29 PM 04/08/2022    8:25 AM  Advanced Directives  Does Patient Have a Medical Advance Directive? Yes Yes No Yes Yes Yes No  Type of Estate agent of [place];Living will Living will;Healthcare Power of [employer]  Power of [place];Living will Living will;Healthcare Power of Attorney Living will;Healthcare Power of Attorney   Does patient want to make changes to medical advance directive? No - Patient declined   No - Patient declined     Copy of Healthcare Power of Attorney in Chart? No - copy requested No - copy requested  No - copy requested No - copy requested No - copy requested   Would patient like information on creating a medical advance directive? No - Patient declined No - Patient declined   No - Patient declined No - Patient declined No - Patient declined    Current Medications (verified) Outpatient Encounter Medications as of 04/14/2024  Medication Sig   acetaminophen  (TYLENOL ) 500 MG tablet Take 1,000 mg by mouth every 6 (six) hours as needed for moderate pain or headache.   apixaban  (ELIQUIS ) 5 MG TABS tablet Take 1 tablet (5 mg total) by mouth 2 (two) times daily.   budesonide -formoterol  (SYMBICORT ) 160-4.5 MCG/ACT inhaler Inhale 2 puffs into the lungs in the morning and at bedtime.   cetirizine  (ZYRTEC ) 10 MG tablet Take 10 mg by mouth daily as needed for allergies.   Cyanocobalamin  (VITAMIN B-12 PO) Take 1 tablet by mouth daily.   glucose blood (PRODIGY NO CODING BLOOD GLUC) test strip Use as instructed to check blood sugar twice a day.  DX  E11.40   hydrALAZINE  (APRESOLINE ) 50 MG tablet TAKE 1 TABLET BY MOUTH THREE TIMES A DAY   insulin  aspart (NOVOLOG  FLEXPEN) 100 UNIT/ML FlexPen Max daily 30 units (Patient taking differently: 12 Units. Max daily 30 units)   insulin  glargine (LANTUS   SOLOSTAR) 100 UNIT/ML Solostar Pen Inject 18 Units into the skin daily.   Insulin  Pen Needle (BD PEN NEEDLE NANO 2ND GEN) 32G X 4 MM MISC Inject 1 Device into the skin in the morning, at noon, in the evening, and at bedtime.   isosorbide  mononitrate (IMDUR ) 30 MG 24 hr tablet TAKE 1 TABLET BY MOUTH EVERY DAY   metFORMIN  (GLUCOPHAGE ) 1000 MG tablet Take 1 tablet (1,000 mg total) by mouth 2 (two) times daily with  a meal.   metoprolol  succinate (TOPROL -XL) 50 MG 24 hr tablet TAKE 1 TABLET BY MOUTH EVERY DAY   rosuvastatin  (CRESTOR ) 20 MG tablet Take 1 tablet (20 mg total) by mouth daily.   sacubitril -valsartan  (ENTRESTO ) 97-103 MG Take 1 tablet by mouth 2 (two) times daily.   spironolactone  (ALDACTONE ) 25 MG tablet TAKE 1/2 TABLET BY MOUTH DAILY   amoxicillin  (AMOXIL ) 500 MG capsule Take 4 tablets by mouth 1 hour before dental procedure. (Patient not taking: Reported on 04/14/2024)   OXYGEN  Inhale 2 L/min into the lungs continuous. Use at night (Patient not taking: Reported on 04/14/2024)   No facility-administered encounter medications on file as of 04/14/2024.    Allergies (verified) Farxiga  [dapagliflozin ], Lisinopril , Amlodipine besylate, and Hydrochlorothiazide   History: Past Medical History:  Diagnosis Date   Abnormal myocardial perfusion study 2006   EF 44% ? inferoseptal ischemia. no cath done   Allergy    Benign neoplasm of cecum    Breast mass, right 03/2008   Cardiomyopathy (HCC) 07/20/2018   Ejection fraction 4045% in September 2019   Centrilobular emphysema (HCC) 08/25/2018   02/04/2018-high-res CT- no evidence of interstitial lung disease, severe centrilobular emphysema and mild diffuse bronchial wall thickening suggesting COPD, no calcified pleural plaques no pleural effusion, mild smooth bilateral pleural thickening which is nonspecific  02/04/2018-pulmonary function test- FVC 3.37 (71% predicted), postbronchodilator ratio 50, postbronchodilator FEV1 54, mid flow reve   CHF (congestive heart failure) (HCC)    COLONIC POLYPS, HX OF 03/18/2007   Qualifier: Diagnosis of  By: [name]    Contact dermatitis 01/31/2013   COPD (chronic obstructive pulmonary disease) (HCC)    dx'd 12/2017   COPD GOLD II A  08/25/2018   02/04/2018-high-res CT- no evidence of interstitial lung disease, severe centrilobular emphysema and mild diffuse bronchial wall thickening suggesting COPD, no  calcified pleural plaques no pleural effusion, mild smooth bilateral pleural thickening which is nonspecific  02/04/2018-pulmonary function test- FVC 3.37 (71% predicted), postbronchodilator ratio 50, postbronchodilator FEV1 54, mid flow reve   Coronary artery disease    Critical aortic valve stenosis 01/12/2018   Calculated valve area less than 0.5 cm mean gradient more than 60 mm of mercury   DEGENERATIVE JOINT DISEASE, BACK 03/18/2007   Qualifier: Diagnosis of  By: [name]    Depressed left ventricular ejection fraction 03/28/2020   Diabetes mellitus (HCC) 07/25/2020   Dyslipidemia 04/20/2020   Dyspnea on exertion 01/12/2018   Emphysema of lung (HCC)    ERECTILE DYSFUNCTION, ORGANIC 02/10/2009   Qualifier: Diagnosis of  By: [name]    Former cigarette smoker 10/12/2018   Quit in 2012 41-pack-year smoking history   Goals of care, counseling/discussion 08/24/2020   H/O aortic valve replacement 05/05/2020   Heart murmur    HEMORRHOIDS, INTERNAL 03/18/2007   Qualifier: Diagnosis of  By: [name]    HTN (hypertension) 10/21/2007        Hyperlipemia 10/21/2007   Qualifier: Diagnosis of  By: [name]    Hypertension    Hypoglycemia due to insulin  03/26/2020   Low back pain    MYOCARDIAL PERFUSION SCAN, WITH STRESS TEST, ABNORMAL 08/11/2008   Qualifier: Diagnosis of  By: Delford, MD, CODY Maude Dunnings    Nocturnal hypoxemia 08/25/2018   04/09/18 >>> ONO - telephone note>>> Patient's ONO on room air showed that he was less than 88% for more than an hour. Patient needs to be on 2 L at HS.  10/06/2018-overnight oximetry-entire duration was 4 hours and 23 minutes, SPO2 less than 88% for 25 minutes and 32 seconds     Oxygen  deficiency    uses 2L at night   Paroxysmal atrial fibrillation (HCC) 10/12/2018   PCP NOTES >>>>>>>>>>>>>>>>> 05/14/2020   Polycythemia vera (HCC) 08/04/2020   S/P AVR 03/04/2018   AVR 23 mm Edwards Magna Valve - bioprostetic  03/04/2018 LIMA - LAD   Status post coronary artery bypass graft 03/20/2018   LIMA to LAD during aortic valve replacement surgery 03/04/2018   Type 2 diabetes mellitus with hyperglycemia, with long-term current use of insulin  (HCC) 04/20/2020   Type 2 diabetes mellitus, uncontrolled, with neuropathy 05/20/2008   Qualifier: Diagnosis of  By: [name]    Past Surgical History:  Procedure Laterality Date   ABDOMINAL AORTOGRAM N/A 01/13/2018   Procedure: ABDOMINAL AORTOGRAM;  Surgeon: Burnard Debby LABOR, MD;  Location: [facility] INVASIVE CV LAB;  Service: Cardiovascular;  Laterality: N/A;   AORTIC VALVE REPLACEMENT N/A 03/04/2018   Procedure: AORTIC VALVE REPLACEMENT (AVR) 23mm Edwards Magna Ease Tissue Valve.;  Surgeon: Kerrin Elspeth BROCKS, MD;  Location: [facility] OR;  Service: Open Heart Surgery;  Laterality: N/A;   CARDIAC VALVE REPLACEMENT     COLONOSCOPY W/ POLYPECTOMY     COLONOSCOPY WITH PROPOFOL  N/A 02/22/2020   Procedure: COLONOSCOPY WITH PROPOFOL ;  Surgeon: Legrand Victory LITTIE DOUGLAS, MD;  Location: [facility] ENDOSCOPY;  Service: Gastroenterology;  Laterality: N/A;   CORONARY ARTERY BYPASS GRAFT N/A 03/04/2018   Procedure: CORONARY ARTERY BYPASS GRAFTING (CABG) x1:  LIMA to LAD.;  Surgeon: Kerrin Elspeth BROCKS, MD;  [facility];  Service: Open Heart Surgery;  Laterality: N/A;   CORONARY PRESSURE/FFR STUDY N/A 05/10/2020   Procedure: INTRAVASCULAR PRESSURE WIRE/FFR STUDY;  Surgeon: Wonda Sharper, MD;  Location: [facility] INVASIVE CV LAB;  Service: Cardiovascular;  Laterality: N/A;   HEMOSTASIS CLIP PLACEMENT  02/22/2020   Procedure: HEMOSTASIS CLIP PLACEMENT;  Surgeon: Legrand Victory LITTIE DOUGLAS, MD;  Location: [facility] ENDOSCOPY;  Service: Gastroenterology;;   LEFT HEART CATH AND CORS/GRAFTS ANGIOGRAPHY N/A 05/10/2020   Procedure: LEFT HEART CATH AND CORS/GRAFTS ANGIOGRAPHY;  Surgeon: Wonda Sharper, MD;  Location: [facility] INVASIVE CV LAB;  Service: Cardiovascular;  Laterality: N/A;   LIPOMA EXCISION Left 07/2008    back of  shoulder Dr Nalani   POLYPECTOMY  02/22/2020   Procedure: POLYPECTOMY;  Surgeon: Legrand Victory LITTIE DOUGLAS, MD;  Location: [facility]OSCOPY;  Service: Gastroenterology;;   RIGHT/LEFT HEART CATH AND CORONARY ANGIOGRAPHY N/A 01/13/2018   Procedure: RIGHT/LEFT HEART CATH AND CORONARY ANGIOGRAPHY;  Surgeon: Burnard Debby LABOR, MD;  Location: [facility] INVASIVE CV LAB;  Service: Cardiovascular;  Laterality: N/A;   TEE WITHOUT CARDIOVERSION N/A 03/04/2018   Procedure: TRANSESOPHAGEAL ECHOCARDIOGRAM (TEE);  Surgeon: Kerrin Elspeth BROCKS, MD;  Location: [facility] OR;  Service: Open Heart Surgery;  Laterality: N/A;   TONSILLECTOMY     WISDOM TOOTH EXTRACTION     Family History  Problem Relation Age of Onset   Melanoma Father  78       deceased secondary to melanoma   Alzheimer's disease Father    Diabetes Father    Hypertension Mother        alive -75   Colon cancer Neg Hx    Esophageal cancer Neg Hx    Colon polyps Neg Hx    Stomach cancer Neg Hx    Social History   Socioeconomic History   Marital status: Married    Spouse name: Not on file   Number of children: 3   Years of education: Not on file   Highest education level: Some college, no degree  Occupational History   Not on file  Tobacco Use   Smoking status: Former    Current packs/day: 0.00    Average packs/day: 1.5 packs/day for 27.0 years (40.5 ttl pk-yrs)    Types: Cigarettes    Start date: 30    Quit date: 2000    Years since quitting: 25.6   Smokeless tobacco: Never   Tobacco comments:    Smoked heavy from 39 until 2000, does not qualify for screening  Vaping Use   Vaping status: Never Used  Substance and Sexual Activity   Alcohol use: Yes    Alcohol/week: 2.0 standard drinks of alcohol    Types: 2 Glasses of wine per week    Comment: occasionally   Drug use: Not Currently   Sexual activity: Not Currently  Other Topics Concern   Not on file  Social History Narrative   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   Married    Former Smoker -   33 pack year history   Alcohol use-no     Drug use-no             Social Drivers of Corporate investment banker Strain: Low Risk  (04/14/2024)   Overall Financial Resource Strain (CARDIA)    Difficulty of Paying Living Expenses: Not very hard  Food Insecurity: No Food Insecurity (04/14/2024)   Hunger Vital Sign    Worried About Running Out of Food in the Last Year: Never true    Ran Out of Food in the Last Year: Never true  Transportation Needs: No Transportation Needs (04/14/2024)   PRAPARE - Administrator, Civil Service (Medical): No    Lack of Transportation (Non-Medical): No  Physical Activity: Insufficiently Active (04/14/2024)   Exercise Vital Sign    Days of Exercise per Week: 3 days    Minutes of Exercise per Session: 30 min  Stress: No Stress Concern Present (04/14/2024)   [employer] of Occupational Health - Occupational Stress Questionnaire    Feeling of Stress: Only a little  Social Connections: Socially Integrated (04/14/2024)   Social Connection and Isolation Panel    Frequency of Communication with Friends and Family: More than three times a week    Frequency of Social Gatherings with Friends and Family: Once a week    Attends Religious Services: 1 to 4 times per year    Active Member of [employer] or Organizations: Yes    Attends Banker Meetings: 1 to 4 times per year    Marital Status: Married    Tobacco Counseling Counseling given: Not Answered Tobacco comments: Smoked heavy from 1973 until 2000, does not qualify for screening    Clinical Intake:  Pre-visit preparation completed: Yes  Pain : No/denies pain     BMI - recorded: 20.69 Nutritional Status: BMI of 19-24  Normal Nutritional Risks: None Diabetes: Yes CBG  done?: No Did pt. bring in CBG monitor from home?: No  Lab Results  Component Value Date   HGBA1C 6.6 (A) 11/17/2023   HGBA1C 6.7 (A) 05/16/2023   HGBA1C 7.0 (A) 11/12/2022     How often do you need to have  someone help you when you read instructions, pamphlets, or other written materials from your doctor or pharmacy?: 1 - Never What is the last grade level you completed in school?: college  Interpreter Needed?: No  Information entered by :: Lolita Libra, CMA   Activities of Daily Living     04/14/2024   10:28 AM 04/13/2024    8:44 PM  In your present state of health, do you have any difficulty performing the following activities:  Hearing? 0 1  Vision? 0 0  Difficulty concentrating or making decisions? 0 0  Walking or climbing stairs? 0   Dressing or bathing? 0 0  Doing errands, shopping? 0   Preparing Food and eating ? N N  Using the Toilet? N N  In the past six months, have you accidently leaked urine? N N  Do you have problems with loss of bowel control? N N  Managing your Medications? N N  Managing your Finances? N N  Housekeeping or managing your Housekeeping? N N    Patient Care Team: Amon Aloysius BRAVO, MD as PCP - General (Internal Medicine) Tobb, Kardie, DO as PCP - Cardiology (Cardiology) Wonda Sharper, MD as PCP - Structural Heart (Cardiology) Trixie File, MD as Consulting Physician (Internal Medicine) Meade Verdon RAMAN, MD as Consulting Physician (Pulmonary Disease) Amon Aloysius BRAVO, MD as Consulting Physician (Internal Medicine)  I have updated your Care Teams any recent Medical Services you may have received from other providers in the past year.     Assessment:   This is a routine wellness examination for Gabrien.  Hearing/Vision screen Hearing Screening - Comments:: Denies hearing difficulties.  Vision Screening - Comments:: up to date with routine eye exams.    Goals Addressed               This Visit's Progress     COMPLETED: Increase physical activity        Do bike 15 min/ 2 days per week.      Patient Stated (pt-stated)        He wants to maintain current physical activity of walking 30 min, three times a week       Depression Screen      04/14/2024   10:23 AM 04/12/2024    1:02 PM 03/31/2024   10:39 AM 10/21/2023    2:15 PM 06/18/2023   11:11 AM 04/10/2023    8:27 AM 01/28/2023   11:17 AM  PHQ 2/9 Scores  PHQ - 2 Score 1 1 0 0 0 1 0  PHQ- 9 Score 2          Fall Risk     04/14/2024   10:40 AM 04/13/2024    8:44 PM 04/12/2024    1:02 PM 10/21/2023    2:14 PM 06/18/2023   11:11 AM  Fall Risk   Falls in the past year? 0 0 0 0 0  Number falls in past yr: 0 0 0 0 0  Injury with Fall? 0 0 0 0 0  Risk for fall due to : Impaired balance/gait Impaired balance/gait  No Fall Risks   Follow up Education provided Education provided Falls evaluation completed;Education provided  Falls evaluation completed    MEDICARE  RISK AT HOME:  Medicare Risk at Home Any stairs in or around the home?: No If so, are there any without handrails?: No Home free of loose throw rugs in walkways, pet beds, electrical cords, etc?: No Adequate lighting in your home to reduce risk of falls?: Yes Life alert?: No Use of a cane, walker or w/c?: No Grab bars in the bathroom?: Yes Shower chair or bench in shower?: No Elevated toilet seat or a handicapped toilet?: No  TIMED UP AND GO:  Was the test performed?  No  Cognitive Function: 6CIT completed        04/14/2024   10:37 AM 04/10/2023    8:29 AM  6CIT Screen  What Year? 0 points 0 points  What month? 0 points 0 points  What time? 0 points 0 points  Count back from 20 0 points 0 points  Months in reverse 0 points 0 points  Repeat phrase 4 points 2 points  Total Score 4 points 2 points    Immunizations Immunization History  Administered Date(s) Administered   Fluad Quad(high Dose 65+) 05/26/2019, 05/12/2020, 06/04/2021, 06/05/2022   Fluad Trivalent(High Dose 65+) 06/18/2023   Influenza Split 08/04/2012   Influenza Whole 05/20/2008, 07/12/2009, 05/02/2010   Influenza, High Dose Seasonal PF 05/20/2017, 06/10/2018   Influenza,inj,Quad PF,6+ Mos 05/07/2013, 05/09/2014, 06/07/2015,  05/01/2016   PFIZER(Purple Top)SARS-COV-2 Vaccination 10/08/2019, 11/03/2019, 11/16/2019, 12/14/2019, 06/29/2020   PNEUMOCOCCAL CONJUGATE-20 10/21/2023   Pneumococcal Conjugate-13 02/05/2017   Pneumococcal Polysaccharide-23 05/20/2008, 08/25/2018   Td 08/26/2008    Screening Tests Health Maintenance  Topic Date Due   Zoster Vaccines- Shingrix (1 of 2) Never done   DTaP/Tdap/Td (2 - Tdap) 08/26/2018   Diabetic kidney evaluation - Urine ACR  04/19/2021   COLON CANCER SCREENING ANNUAL FOBT  11/10/2021   COVID-19 Vaccine (6 - 2024-25 season) 04/27/2023   Medicare Annual Wellness (AWV)  04/09/2024   INFLUENZA VACCINE  11/23/2024 (Originally 03/26/2024)   Colonoscopy  04/12/2025 (Originally 03/04/2023)   FOOT EXAM  05/15/2024   HEMOGLOBIN A1C  05/19/2024   OPHTHALMOLOGY EXAM  09/24/2024   Diabetic kidney evaluation - eGFR measurement  03/31/2025   Pneumococcal Vaccine: 50+ Years  Completed   Hepatitis C Screening  Completed   HPV VACCINES  Aged Out   Meningococcal B Vaccine  Aged Out    Health Maintenance  Health Maintenance Due  Topic Date Due   Zoster Vaccines- Shingrix (1 of 2) Never done   DTaP/Tdap/Td (2 - Tdap) 08/26/2018   Diabetic kidney evaluation - Urine ACR  04/19/2021   COLON CANCER SCREENING ANNUAL FOBT  11/10/2021   COVID-19 Vaccine (6 - 2024-25 season) 04/27/2023   Medicare Annual Wellness (AWV)  04/09/2024   Health Maintenance Items Addressed: Pt declines: shingles, tetanus and covid vaccines. Will complete urine MALB and IFOB and return as soon as possible. Will call Dr Legrand to discuss colonoscopy.   Additional Screening:  Vision Screening: Recommended annual ophthalmology exams for early detection of glaucoma and other disorders of the eye. Would you like a referral to an eye doctor? No    Dental Screening: Recommended annual dental exams for proper oral hygiene  Community Resource Referral / Chronic Care Management: CRR required this visit?  No   CCM  required this visit?  No   Plan:    I have personally reviewed and noted the following in the patient's chart:   Medical and social history Use of alcohol, tobacco or illicit drugs  Current medications and supplements including opioid  prescriptions. Patient is not currently taking opioid prescriptions. Functional ability and status Nutritional status Physical activity Advanced directives List of other physicians Hospitalizations, surgeries, and ER visits in previous 12 months Vitals Screenings to include cognitive, depression, and falls Referrals and appointments  In addition, I have reviewed and discussed with patient certain preventive protocols, quality metrics, and best practice recommendations. A written personalized care plan for preventive services as well as general preventive health recommendations were provided to patient.   Lolita Libra, CMA   04/14/2024   After Visit Summary: (MyChart) Due to this being a telephonic visit, the after visit summary with patients personalized plan was offered to patient via MyChart   Notes: see phone note

## 2024-04-14 NOTE — Patient Instructions (Signed)
 Mr. Dass , Thank you for taking time out of your busy schedule to complete your Annual Wellness Visit with me. I enjoyed our conversation and look forward to speaking with you again next year. I, as well as your care team,  appreciate your ongoing commitment to your health goals. Please review the following plan we discussed and let me know if I can assist you in the future. Your Game plan/ To Do List    Please call cardiology about your shortness of breath and potential side effects of Entresto  and spironolactone .  Please complete your diabetic foot exam with Dr Sam.  Please return your stool and urine sample to our office as soon as possible.  Follow up Visits: Next Medicare AWV with our clinical staff: 04/15/25 10:20am    Next Office Visit with your provider: 07/12/24 1pm  Clinician Recommendations:  Aim for 30 minutes of exercise or brisk walking, 6-8 glasses of water, and 5 servings of fruits and vegetables each day.       This is a list of the screening recommended for you and due dates:  Health Maintenance  Topic Date Due   Zoster (Shingles) Vaccine (1 of 2) Never done   DTaP/Tdap/Td vaccine (2 - Tdap) 08/26/2018   Yearly kidney health urinalysis for diabetes  04/19/2021   Stool Blood Test  11/10/2021   COVID-19 Vaccine (6 - 2024-25 season) 04/27/2023   Medicare Annual Wellness Visit  04/09/2024   Flu Shot  11/23/2024*   Colon Cancer Screening  04/12/2025*   Complete foot exam   05/15/2024   Hemoglobin A1C  05/19/2024   Eye exam for diabetics  09/24/2024   Yearly kidney function blood test for diabetes  03/31/2025   Pneumococcal Vaccine for age over 11  Completed   Hepatitis C Screening  Completed   HPV Vaccine  Aged Out   Meningitis B Vaccine  Aged Out  *Topic was postponed. The date shown is not the original due date.    Advanced directives: (Copy Requested) Please bring a copy of your health care power of attorney and living will to the office to be added  to your chart at your convenience. You can mail to [facility] [address] [place], [place] [address] or email to [email or web address] Advance Care Planning is important because it:  [x]  Makes sure you receive the medical care that is consistent with your values, goals, and preferences  [x]  It provides guidance to your family and loved ones and reduces their decisional burden about whether or not they are making the right decisions based on your wishes.  Follow the link provided in your after visit summary or read over the paperwork we have mailed to you to help you started getting your Advance Directives in place. If you need assistance in completing these, please reach out to us  so that we can help you!  See attachments for Preventive Care and Fall Prevention Tips.

## 2024-04-14 NOTE — Telephone Encounter (Signed)
 Pt had AWV today and states he had been feeling more winded than usual and the pharmacist told him it could be coming from his Entresto  and Spironolactone .  Advised pt to reach out to cardiology to discuss before stopping medication and he is agreeable. Please advise if any other recommendation?  Also forwarding note to cardiology to follow up with pt.

## 2024-04-15 ENCOUNTER — Other Ambulatory Visit (INDEPENDENT_AMBULATORY_CARE_PROVIDER_SITE_OTHER)

## 2024-04-15 ENCOUNTER — Telehealth: Payer: Self-pay | Admitting: Physician Assistant

## 2024-04-15 DIAGNOSIS — R195 Other fecal abnormalities: Secondary | ICD-10-CM | POA: Diagnosis not present

## 2024-04-15 DIAGNOSIS — E1165 Type 2 diabetes mellitus with hyperglycemia: Secondary | ICD-10-CM | POA: Diagnosis not present

## 2024-04-15 DIAGNOSIS — Z794 Long term (current) use of insulin: Secondary | ICD-10-CM

## 2024-04-15 DIAGNOSIS — Z0000 Encounter for general adult medical examination without abnormal findings: Secondary | ICD-10-CM

## 2024-04-15 LAB — MICROALBUMIN / CREATININE URINE RATIO
Creatinine,U: 102 mg/dL
Microalb Creat Ratio: 205.1 mg/g — ABNORMAL HIGH (ref 0.0–30.0)
Microalb, Ur: 20.9 mg/dL — ABNORMAL HIGH (ref 0.0–1.9)

## 2024-04-15 LAB — FECAL OCCULT BLOOD, IMMUNOCHEMICAL: Fecal Occult Bld: NEGATIVE

## 2024-04-15 NOTE — Telephone Encounter (Signed)
 Pt c/o medication issue:  1. Name of Medication: amoxicillin   2. How are you currently taking this medication (dosage and times per day)?   3. Are you having a reaction (difficulty breathing--STAT)?   4. What is your medication issue? Patient's wife called wanting to know if patient can take this medication.  She said dentist wants to know if it's okay and if so, can something be faxed them them at [phone].  Dentist appt is on 8/22, patient is having a cleaning done.  Patient's wife said she would also like a like a callback so she know if patient can take medication.

## 2024-04-16 ENCOUNTER — Encounter: Payer: Self-pay | Admitting: Cardiology

## 2024-04-16 MED ORDER — AMOXICILLIN 500 MG PO CAPS
ORAL_CAPSULE | ORAL | 3 refills | Status: AC
Start: 2024-04-16 — End: ?

## 2024-04-16 NOTE — Telephone Encounter (Signed)
 I just saw him 4 days ago, at the time shortness of breath was stable.  Advised patient we will need to see him again if he is truly feels more short of breath than usual.  If severe symptoms go to the ER

## 2024-04-16 NOTE — Telephone Encounter (Signed)
 Notified pt of below and he voices understanding. He states he has also spoken with cardiology and scheduled appt with them on 04/21/24.

## 2024-04-16 NOTE — Telephone Encounter (Signed)
 Spoke with pt's wife who stated they do have Amoxicillin  on hand but only 4 tablets left. Explained to the pt's wife that the pt will need to always take abx prior to a dental cleaning or a dental procedure due to his valve replacement hx. Pt's wife verbalized understanding of this and said to send in future refills to [facility] on [address]. Sent in prescription to pt preferred pharmacy.

## 2024-04-18 DIAGNOSIS — J449 Chronic obstructive pulmonary disease, unspecified: Secondary | ICD-10-CM | POA: Diagnosis not present

## 2024-04-21 ENCOUNTER — Encounter: Payer: Self-pay | Admitting: Cardiovascular Disease

## 2024-04-21 ENCOUNTER — Ambulatory Visit: Attending: Cardiovascular Disease | Admitting: Cardiovascular Disease

## 2024-04-21 VITALS — BP 110/80 | HR 84 | Ht 76.0 in | Wt 170.6 lb

## 2024-04-21 DIAGNOSIS — T82857D Stenosis of cardiac prosthetic devices, implants and grafts, subsequent encounter: Secondary | ICD-10-CM

## 2024-04-21 DIAGNOSIS — I2510 Atherosclerotic heart disease of native coronary artery without angina pectoris: Secondary | ICD-10-CM | POA: Diagnosis not present

## 2024-04-21 NOTE — Patient Instructions (Signed)
 Medication Instructions:  No medication changes were made at this visit. Continue current regimen.   *If you need a refill on your cardiac medications before your next appointment, please call your pharmacy*  Lab Work: None ordered today. If you have labs (blood work) drawn today and your tests are completely normal, you will receive your results only by: MyChart Message (if you have MyChart) OR A paper copy in the mail If you have any lab test that is abnormal or we need to change your treatment, we will call you to review the results.  Testing/Procedures: You have an echocardiogram scheduled for 05/14/24 at 11:20 AM (please arrive by 11AM).  Follow-Up: At [facility], you and your health needs are our priority.  As part of our continuing mission to provide you with exceptional heart care, our providers are all part of one team.  This team includes your primary Cardiologist (physician) and Advanced Practice Providers or APPs (Physician Assistants and Nurse Practitioners) who all work together to provide you with the care you need, when you need it.  Your next appointment:   6 month(s)  Provider:   Dr. Wonda

## 2024-04-21 NOTE — Assessment & Plan Note (Signed)
 The patient's most recent echo in June 2025 showed LVEF of 55 to 60%, mild LVH, grade 1 diastolic dysfunction, and report of moderate aortic stenosis with a mean transvalvular gradient of 27 mmHg, V-max 3.5 m/s, and no reported change from the previous echo in November 2024.  His exam is not impressive with respect to his aortic valve murmur.  When he was found to have evidence of mild halt on CT suggestive of subacute leaflet thrombus, the plan was to anticoagulate him for 3 months and repeat an echocardiogram.  I think we should stay with this plan and his echo is scheduled in about 1 month.  The patient appears clinically stable.  He also was incidentally noted on his CT scan to have severe emphysema and I am sure this is contributing to his shortness of breath.  I think we need to be cautious about jumping into an aortic valve intervention until we are certain it is progressing and contributing significantly to his symptoms.  In reviewing some of his recent data, he was noted to have a normal BNP earlier this summer.  The patient will continue his current medical program and I will be in touch with him after his echo results next month.  I will plan to see him back in 6 months for follow-up of his valvular heart disease.  He will remain off of spironolactone  but continue his other medical therapy without change.

## 2024-04-21 NOTE — Progress Notes (Signed)
 Cardiology Office Note:    Date:  04/21/2024   ID:  ALEXANDRO LINE, DOB May 19, 1951, MRN [id number]  PCP:  Amon Aloysius BRAVO, MD    [facility] Providers Cardiologist:  Dub Huntsman, DO Structural Heart:  Ozell Fell, MD    Referring MD: Amon Aloysius BRAVO, MD   Chief Complaint  Patient presents with   Shortness of Breath    History of Present Illness:    PEDRAM GOODCHILD is a 73 y.o. male returns for follow-up of prosthetic aortic stenosis. Comorbid conditions include CAD/aortic stenosis s/p CABG x1V/AVR 2019, COPD, HTN, HLD, DMT2, and polycythemia vera. The patient underwent CABG/AVR in 2019 when he was treated with a 23 mm [facility] Ease bovine pericardial valve and single-vessel CABG with a LIMA to LAD graft. Echo 01/26/24 showed EF 55%, moderate stenosis of the aortic prosthesis with mean gradient measures 27.0 mmHg, Vmax measures 3.50 m/s. Aortic valve acceleration time measures 98 msec, AVA 0.91, DVI 27, SVI 31, similar to previous study. Cardiac CTA showed evidence of HALT suggesting subacute leaflet thrombosis and the patient was started on oral anticoagulation. He returns today for follow-up evaluation in the setting of progressive dyspnea.   Patient read there is concern about worsening shortness of breath when using spironolactone  and Entresto  together.  He held these medicines for a few days and then started back on Entresto  alone.  He has stopped spironolactone .  He does not appreciate a big change in how he feels and he complains of chronic shortness of breath with activity.  He denies orthopnea, PND, or leg edema.  He denies lightheadedness, chest pain, or chest pressure.  He does not appreciate a big change in his symptoms since starting apixaban  2 months ago.  He is short of breath with any moderate level activity.  He became very short of breath with taking his trash down to the curb last week.  He reports shortness of breath with walking up stairs or walking up a hill,  but he can walk on level ground at a normal pace without too much trouble.   Current Medications: Current Meds  Medication Sig   acetaminophen  (TYLENOL ) 500 MG tablet Take 1,000 mg by mouth every 6 (six) hours as needed for moderate pain or headache.   amoxicillin  (AMOXIL ) 500 MG capsule Take 4 tablets by mouth 1 hour before dental procedure or cleaning.   apixaban  (ELIQUIS ) 5 MG TABS tablet Take 1 tablet (5 mg total) by mouth 2 (two) times daily.   budesonide -formoterol  (SYMBICORT ) 160-4.5 MCG/ACT inhaler Inhale 2 puffs into the lungs in the morning and at bedtime.   cetirizine  (ZYRTEC ) 10 MG tablet Take 10 mg by mouth daily as needed for allergies.   Cyanocobalamin  (VITAMIN B-12 PO) Take 1 tablet by mouth daily.   glucose blood (PRODIGY NO CODING BLOOD GLUC) test strip Use as instructed to check blood sugar twice a day.  DX  E11.40   hydrALAZINE  (APRESOLINE ) 50 MG tablet TAKE 1 TABLET BY MOUTH THREE TIMES A DAY   insulin  aspart (NOVOLOG  FLEXPEN) 100 UNIT/ML FlexPen Max daily 30 units   insulin  glargine (LANTUS  SOLOSTAR) 100 UNIT/ML Solostar Pen Inject 18 Units into the skin daily.   Insulin  Pen Needle (BD PEN NEEDLE NANO 2ND GEN) 32G X 4 MM MISC Inject 1 Device into the skin in the morning, at noon, in the evening, and at bedtime.   isosorbide  mononitrate (IMDUR ) 30 MG 24 hr tablet TAKE 1 TABLET BY MOUTH EVERY DAY  metFORMIN  (GLUCOPHAGE ) 1000 MG tablet Take 1 tablet (1,000 mg total) by mouth 2 (two) times daily with a meal.   metoprolol  succinate (TOPROL -XL) 50 MG 24 hr tablet TAKE 1 TABLET BY MOUTH EVERY DAY   OXYGEN  Inhale 2 L/min into the lungs continuous. Use at night   rosuvastatin  (CRESTOR ) 20 MG tablet Take 1 tablet (20 mg total) by mouth daily.   sacubitril -valsartan  (ENTRESTO ) 97-103 MG Take 1 tablet by mouth 2 (two) times daily.     Allergies:   Farxiga  [dapagliflozin ], Lisinopril , Amlodipine besylate, and Hydrochlorothiazide   ROS:   Please see the history of present  illness.    All other systems reviewed and are negative.  EKGs/Labs/Other Studies Reviewed:    The following studies were reviewed today: Cardiac Studies & Procedures   ______________________________________________________________________________________________ CARDIAC CATHETERIZATION  CARDIAC CATHETERIZATION 05/10/2020  Conclusion 1.  Moderate two-vessel coronary artery disease of the LAD and ramus intermedius branch, both with 60 to 70% lesions with negative pressure wire analysis 2.  Widely patent right coronary artery with minimal irregularity 3.  No significant transvalvular gradient across the bioprosthetic aortic valve and normal LVEDP  Recommendations: Medical therapy for cardiomyopathy and coronary artery disease.  Findings Coronary Findings Diagnostic  Dominance: Right  Left Anterior Descending Prox LAD lesion is 65% stenosed. DFR normal at 0.92  Ramus Intermedius Ramus lesion is 65% stenosed. DFR normal at 0.98  Left Circumflex Prox Cx lesion is 30% stenosed. Mid Cx lesion is 30% stenosed.  Right Coronary Artery Prox RCA to Mid RCA lesion is 20% stenosed.  Acute Marginal Branch Acute Mrg lesion is 30% stenosed.  Sequential LIMA LIMA Graft To 1st Diag, Mid LAD LIMA. The graft is atretic.  Patent but atretic LIMA graft  Intervention  No interventions have been documented.   CARDIAC CATHETERIZATION  CARDIAC CATHETERIZATION 01/13/2018  Conclusion  Prox LAD lesion is 65% stenosed.  Ramus lesion is 30% stenosed.  Prox Cx lesion is 30% stenosed.  Mid Cx lesion is 30% stenosed.  Prox RCA to Mid RCA lesion is 20% stenosed.  Acute Mrg lesion is 30% stenosed.  There is severe aortic valve stenosis. There is mild (2+) aortic regurgitation.  Mild to moderate coronary obstructive disease with evidence for coronary calcification.  There is 60 to 70% stenosis of the LAD after the takeoff of the first diagonal vessel; 30% stenosis in the ramus intermediate  vessel proximally; 30% proximal mid left circumflex stenoses, and 20% RCA stenosis proximally with 30% stenosis and an RV marginal branch.  Very mild right heart catheterization elevation with mean PA pressure at 30 mm.  Severe critical aortic valve stenosis with a peak instantaneous gradient of 80 mmHg, peak to peak gradient of 56 mm, and mean gradient of 51 mmHg.  The aortic valve area 0.6 cm.  Supravalvular aortography reveals calcified aortic valve with markedly reduced excursion.  There is 1-2+ angiographic aortic insufficiency.  Aortic root is mildly enlarged.  Distal aortography not reveal any distal aortic aneurysm and revealed bilateral iliofemoral  Flow.  RECOMMENDATION: The patient will follow up with Dr. Krasowski.  Catheterization confirmed severe aortic stenosis.  He will be referred for surgical evaluation by Dr. Krasowski for consideration of AVR/CABG.  Findings Coronary Findings Diagnostic  Dominance: Right  Left Anterior Descending Prox LAD lesion is 65% stenosed.  Ramus Intermedius Ramus lesion is 30% stenosed.  Left Circumflex Prox Cx lesion is 30% stenosed. Mid Cx lesion is 30% stenosed.  Right Coronary Artery Prox RCA to Mid RCA lesion  is 20% stenosed.  Acute Marginal Branch Acute Mrg lesion is 30% stenosed.  Intervention  No interventions have been documented.     ECHOCARDIOGRAM  ECHOCARDIOGRAM COMPLETE 01/26/2024  Narrative ECHOCARDIOGRAM REPORT    Patient Name:   AVRAM DANIELSON Date of Exam: 01/26/2024 Medical Rec #:  [id number]          Height:       76.0 in Accession #:    [id number]         Weight:       178.0 lb Date of Birth:  01/26/1951         BSA:          2.108 m Patient Age:    72 years           BP:           138/78 mmHg Patient Gender: M                  HR:           89 bpm. Exam Location:  [address]  Procedure: 2D Echo, Cardiac Doppler, Color Doppler and 3D Echo (Both Spectral and Color Flow Doppler were utilized  during procedure).  Indications:    Z95.2 S/p AVR  History:        Patient has prior history of Echocardiogram examinations, most recent 07/01/2023. Ischemic cardiomyopathy, CAD, Prior CABG, COPD, S/p AVR (23mm [employer]); Risk Factors:Diabetes, Hypertension and Dyslipidemia. Aortic Valve: 23 mm Magna bioprosthetic valve is present in the aortic position. Procedure Date: 03/13/2018.  Sonographer:    Elsie Bohr RDCS Referring Phys: [phone] Zayleigh Stroh  IMPRESSIONS   1. Left ventricular ejection fraction, by estimation, is 55 to 60%. Left ventricular ejection fraction by 3D volume is 55 %. The left ventricle has normal function. The left ventricle has no regional wall motion abnormalities. There is mild concentric left ventricular hypertrophy. Left ventricular diastolic parameters are consistent with Grade I diastolic dysfunction (impaired relaxation). 2. Right ventricular systolic function is low normal. The right ventricular size is normal. Tricuspid regurgitation signal is inadequate for assessing PA pressure. 3. The mitral valve is normal in structure. No evidence of mitral valve regurgitation. No evidence of mitral stenosis. 4. The aortic valve has been repaired/replaced. Aortic valve regurgitation is not visualized. Moderate aortic valve stenosis. There is a 23 mm Magna bioprosthetic valve present in the aortic position. Procedure Date: 03/13/2018. Echo findings are consistent with stenosis of the aortic prosthesis. Aortic valve mean gradient measures 27.0 mmHg. Aortic valve Vmax measures 3.50 m/s. Aortic valve acceleration time measures 98 msec. 5. The inferior vena cava is normal in size with greater than 50% respiratory variability, suggesting right atrial pressure of 3 mmHg.  Comparison(s): A prior study was performed on 07/01/2023. No significant change from prior study. Prior images reviewed side by side. Aortic valve gradients remain substantially higher compared to  2020-2023.  FINDINGS Left Ventricle: Left ventricular ejection fraction, by estimation, is 55 to 60%. Left ventricular ejection fraction by 3D volume is 55 %. The left ventricle has normal function. The left ventricle has no regional wall motion abnormalities. The left ventricular internal cavity size was normal in size. There is mild concentric left ventricular hypertrophy. Left ventricular diastolic parameters are consistent with Grade I diastolic dysfunction (impaired relaxation). Normal left ventricular filling pressure.  Right Ventricle: The right ventricular size is normal. No increase in right ventricular wall thickness. Right ventricular systolic function is low normal. Tricuspid regurgitation signal  is inadequate for assessing PA pressure.  Left Atrium: Left atrial size was normal in size.  Right Atrium: Right atrial size was normal in size.  Pericardium: There is no evidence of pericardial effusion.  Mitral Valve: The mitral valve is normal in structure. No evidence of mitral valve regurgitation. No evidence of mitral valve stenosis.  Tricuspid Valve: The tricuspid valve is normal in structure. Tricuspid valve regurgitation is not demonstrated.  Aortic Valve: The aortic valve has been repaired/replaced. Aortic valve regurgitation is not visualized. Moderate aortic stenosis is present. Aortic valve mean gradient measures 27.0 mmHg. Aortic valve peak gradient measures 49.0 mmHg. Aortic valve area, by VTI measures 0.94 cm. There is a 23 mm Magna bioprosthetic valve present in the aortic position. Procedure Date: 03/13/2018.  Pulmonic Valve: The pulmonic valve was grossly normal. Pulmonic valve regurgitation is not visualized. No evidence of pulmonic stenosis.  Aorta: The aortic root and ascending aorta are structurally normal, with no evidence of dilitation.  Venous: The inferior vena cava is normal in size with greater than 50% respiratory variability, suggesting right atrial  pressure of 3 mmHg.  IAS/Shunts: No atrial level shunt detected by color flow Doppler.  Additional Comments: 3D was performed not requiring image post processing on an independent workstation and was normal.   LEFT VENTRICLE PLAX 2D LVIDd:         4.20 cm         Diastology LVIDs:         2.60 cm         LV e' medial:    7.72 cm/s LV PW:         1.20 cm         LV E/e' medial:  10.7 LV IVS:        1.40 cm         LV e' lateral:   13.40 cm/s LVOT diam:     2.10 cm         LV E/e' lateral: 6.2 LV SV:         66 LV SV Index:   31 LVOT Area:     3.46 cm        3D Volume EF LV 3D EF:    Left ventricul ar ejection fraction by 3D volume is 55 %.  3D Volume EF: 3D EF:        55 % LV EDV:       146 ml LV ESV:       67 ml LV SV:        80 ml  RIGHT VENTRICLE             IVC RV S prime:     10.70 cm/s  IVC diam: 1.30 cm TAPSE (M-mode): 1.3 cm  LEFT ATRIUM             Index        RIGHT ATRIUM           Index LA diam:        2.80 cm 1.33 cm/m   RA Pressure: 3.00 mmHg LA Vol (A2C):   55.5 ml 26.33 ml/m  RA Area:     13.30 cm LA Vol (A4C):   24.2 ml 11.48 ml/m  RA Volume:   34.50 ml  16.36 ml/m LA Biplane Vol: 36.6 ml 17.36 ml/m AORTIC VALVE AV Area (Vmax):    0.87 cm AV Area (Vmean):   0.91 cm AV Area (VTI):     0.94  cm AV Vmax:           350.00 cm/s AV Vmean:          240.500 cm/s AV VTI:            0.706 m AV Peak Grad:      49.0 mmHg AV Mean Grad:      27.0 mmHg LVOT Vmax:         87.80 cm/s LVOT Vmean:        62.900 cm/s LVOT VTI:          0.191 m LVOT/AV VTI ratio: 0.27  AORTA Ao Root diam: 3.40 cm Ao Asc diam:  3.40 cm  MITRAL VALVE                TRICUSPID VALVE MV Area (PHT): 2.78 cm     Estimated RAP:  3.00 mmHg MV Decel Time: 273 msec MV E velocity: 82.80 cm/s   SHUNTS MV A velocity: 112.00 cm/s  Systemic VTI:  0.19 m MV E/A ratio:  0.74         Systemic Diam: 2.10 cm  Jerel Croitoru MD Electronically signed by Jerel Balding MD Signature  Date/Time: 01/26/2024/3:15:45 PM    Final   TEE  ECHO TEE 03/04/2018  Interpretation Summary  Aortic valve: The valve is bicuspid. Severe valve thickening present. Severe valve calcification present. Severely decreased leaflet separation. Critical stenosis. Mild to moderate regurgitation.  Right ventricle: Normal cavity size, wall thickness and ejection fraction.  Tricuspid valve: Mild regurgitation. The tricuspid valve regurgitation jet is central.  MONITORS  LONG TERM MONITOR (3-14 DAYS) 07/30/2018  Narrative A 3-day ZIO monitor was performed in response to a phone call from cardiac rehabilitation frequent PVCs.  The rhythm throughout is sinus minimum average and maximum heart rates of 60, 81 and 112 bpm  There were no pauses of 3 seconds or greater, no episodes of sinus node or AV block  Ventricular ectopy was rare with isolated PVCs and couplets however episodically the patient had episodes of ventricular trigeminy.  Supraventricular ectopy was rare with isolated APCs and one asymptomatic 5 beat run of atrial premature contractions.  There were no episodes of atrial fibrillation or flutter.  There were no triggered or diary events  Conclusion, although the burden of ventricular ectopy is low with rare PVCs there were intermittent episodes of ventricular trigeminy.   CT SCANS  CT CORONARY MORPH W/CTA COR W/SCORE 02/10/2024  Addendum 02/11/2024 11:17 AM ADDENDUM REPORT: 02/11/2024 11:14  EXAM: OVER-READ INTERPRETATION  CT CHEST  The following report is an over-read performed by radiologist Dr. Juliene Cramp [facility] on 02/11/2024. This over-read does not include interpretation of cardiac or coronary anatomy or pathology. The coronary CTA interpretation by the cardiologist is attached.  COMPARISON:  CTA chest 02/10/2024  FINDINGS: Visualized mediastinal structures are normal. No large filling defects in the visualized pulmonary arteries. Subtle  low-density in the right hepatic lobe on image 184/304 is nonspecific and could be vascular in etiology. This is likely an incidental finding. Otherwise, images of the upper abdomen are unremarkable. Severe centrilobular emphysema. Prior median sternotomy.  IMPRESSION: 1. No acute abnormality in the visualized extracardiac structures. 2. Severe emphysema.   Electronically Signed By: Juliene Balder M.D. On: 02/11/2024 11:14  Narrative CLINICAL DATA:  Aortic valve pathology with assessment  EXAM: Cardiac TAVR CT  TECHNIQUE: A non-contrast, gated CT scan was obtained with axial slices of 2.5 mm through the heart for aortic valve scoring. A  120 kV retrospective, gated, contrast cardiac scan was obtained. Gantry rotation speed was 230 msec and collimation was 0.63 mm. Nitroglycerin  was not given. A delayed scan was obtained to exclude left atrial appendage thrombus. The 3D dataset was reconstructed in systole with motion correction. The 3D data set was reconstructed in 5% intervals of the 0-95% of the R-R cycle. Systolic and diastolic phases were analyzed on a dedicated workstation using MPR, MIP, and VRT modes. The patient received 100 cc of contrast.  FINDINGS: Valve Characteristics  Manufacturer and model: Magna Ease  Size: 23 mm  Date of implantation: 03/04/2018  Prosthetic Valve Thickening:  There is Grade 2 hypoattenuation leaflet thickening (HALT) of the non-coronary cusp leaflet.  There is Grade 1 HALT of the right coronary cusp leaflet.  There is no HALT of the left coronary cusp leaflet.  Leaflet excursion: Unable to assess due to scan acquisition  Valve in Valve evaluation  Assuming no fracture a 23 mm Sapien Valve or a 26 mm Evolut valve would be appropriate if valve in valve placement was needed, presuming valve fracture was not performed.  A 23 mm valve would be placed below the coronary ostia.  A 26 mm valve would be at risk for left main coronary  occlusion: though valve to coronary distance is less then 3 mm, large sinus and presence of isolated LIMA to LAD may decrease this risk.  Periprosthetic tissue  Perivalvular leak: None  Periprosthetic tissue: No pseudoaneurysm seen  Non prosthetic valve findings valve Findings:  Coronary Arteries: Normal coronary origin. Study not completed with nitroglycerin . LIMA to LAD course is visualized but patency is not well evaluated.  Aorta: Normal caliber  Mitral valve: No calcification  Main Pulmonary artery: Normal caliber  Systemic veins:Normal caliber  Pulmonary veins: Normal anatomy  Left atrial appendage: Chicken wing- patent  Interatrial septum: No clear communication- septum bows to the left  Chamber assessment: Right ventricular dilation  Pericardium: No calcifications, trivial anterior effusion  Extra Cardiac Findings as per separate reporting.  Notable artifacts: NA. Single beat acquisition limits CINE evaluation.  IMPRESSION: 1. Prosthetic Aortic stenosis related to at least HALT. Findings pertinent to the prosthetic valve are detailed above.  Mahesh  Chandrasekhar  Electronically Signed: By: Stanly Leavens M.D. On: 02/10/2024 12:47   CARDIAC MRI  MR CARDIAC MORPHOLOGY W WO CONTRAST 08/07/2020  Narrative CLINICAL DATA:  Cardiomyopathy  EXAM: CARDIAC MRI  TECHNIQUE: The patient was scanned on a 1.5 Tesla GE magnet. A dedicated cardiac coil was used. Functional imaging was done using Fiesta sequences. 2,3, and 4 chamber views were done to assess for RWMA's. Modified Simpson's rule using a short axis stack was used to calculate an ejection fraction on a dedicated work [employer]. The patient received 10 cc of Gadavist . After 10 minutes inversion recovery sequences were used to assess for infiltration and scar tissue.  CONTRAST:  Gadavist  10 cc  FINDINGS: Limited images of the lung fields showed no gross  abnormalities. The patient is status post sternotomy.  Mildly dilated left ventricle with mild LV hypertrophy. Septal dyskinesis, EF calculated at 25% (lower than it looks visually, likely due to dyskinesis). Normal right ventricular size with mildly decreased systolic function, EF 37%. Normal left and right atrial sizes. Bioprosthetic aortic valve with minimal regurgitation noted. No significant mitral regurgitation noted.  Difficult/poor quality delayed enhancement images. Possible small area of subepicardial late gadolinium enhancement (LGE) in the mid inferoseptal wall at the RV insertion site.  Measurements:  LVEDV 176 mL LVSV 45 mL LVEF 25%  RVEDV 132 mL RVSV 48 mL RVEF 37%  IMPRESSION: 1. Mildly dilated LV with mild LV hypertrophy. Dyskinetic septal wall with EF 25%.  2.  Normal RV size with mildly decreased systolic function, EF 37%.  3. Poor delayed enhancement images. No coronary disease-type LGE noted. Possible inferoseptal wall RV insertion site LGE, nonspecific and suggestive of volume/pressure overload.  Dalton Mclean   Electronically Signed By: Ezra Shuck M.D. On: 08/07/2020 17:52   ______________________________________________________________________________________________      EKG:   EKG Interpretation Date/Time:  Wednesday April 21 2024 09:10:59 EDT Ventricular Rate:  84 PR Interval:  164 QRS Duration:  114 QT Interval:  384 QTC Calculation: 453 R Axis:   71  Text Interpretation: Normal sinus rhythm Minimal voltage criteria for LVH, may be normal variant ( Sokolow-Lyon ) T wave abnormality, consider inferior ischemia When compared with ECG of 28-Jul-2023 11:37, No significant change was found Confirmed by Wonda Sharper [phone]) on 04/21/2024 9:28:28 AM    Recent Labs: 10/02/2023: Magnesium  2.1 01/29/2024: BNP 48.6 03/31/2024: ALT 8; BUN 15; Creatinine 1.06; Potassium 4.5; Sodium 136 04/12/2024: Hemoglobin 10.4; Platelets 300.0 R  Recent  Lipid Panel    Component Value Date/Time   CHOL 118 10/02/2023 1034   TRIG 54 10/02/2023 1034   HDL 46 10/02/2023 1034   CHOLHDL 2.6 10/02/2023 1034   CHOLHDL 3 06/18/2023 1146   VLDL 16.6 06/18/2023 1146   LDLCALC 60 10/02/2023 1034   LDLDIRECT 177.6 10/07/2007 1001     Risk Assessment/Calculations:                Physical Exam:    VS:  BP 110/80   Pulse 84   Ht 6' 4 (1.93 m)   Wt 170 lb 9.6 oz (77.4 kg)   SpO2 96%   BMI 20.77 kg/m     Wt Readings from Last 3 Encounters:  04/21/24 170 lb 9.6 oz (77.4 kg)  04/14/24 170 lb (77.1 kg)  04/12/24 170 lb 8 oz (77.3 kg)     GEN:  Well nourished, well developed in no acute distress HEENT: Normal NECK: No JVD; BL carotid bruits LYMPHATICS: No lymphadenopathy CARDIAC: RRR, 2/6 systolic ejection murmur at the right upper sternal border, mid peaking, RESPIRATORY:  Clear to auscultation without rales, wheezing or rhonchi  ABDOMEN: Soft, non-tender, non-distended MUSCULOSKELETAL:  No edema; No deformity  SKIN: Warm and dry NEUROLOGIC:  Alert and oriented x 3 PSYCHIATRIC:  Normal affect   Assessment & Plan Stenosis of prosthetic aortic valve, subsequent encounter The patient's most recent echo in June 2025 showed LVEF of 55 to 60%, mild LVH, grade 1 diastolic dysfunction, and report of moderate aortic stenosis with a mean transvalvular gradient of 27 mmHg, V-max 3.5 m/s, and no reported change from the previous echo in November 2024.  His exam is not impressive with respect to his aortic valve murmur.  When he was found to have evidence of mild halt on CT suggestive of subacute leaflet thrombus, the plan was to anticoagulate him for 3 months and repeat an echocardiogram.  I think we should stay with this plan and his echo is scheduled in about 1 month.  The patient appears clinically stable.  He also was incidentally noted on his CT scan to have severe emphysema and I am sure this is contributing to his shortness of breath.  I  think we need to be cautious about jumping into an aortic valve intervention until  we are certain it is progressing and contributing significantly to his symptoms.  In reviewing some of his recent data, he was noted to have a normal BNP earlier this summer.  The patient will continue his current medical program and I will be in touch with him after his echo results next month.  I will plan to see him back in 6 months for follow-up of his valvular heart disease.  He will remain off of spironolactone  but continue his other medical therapy without change. Coronary artery disease involving native coronary artery of native heart without angina pectoris Patient without anginal symptoms.  Not on antiplatelet therapy because of oral anticoagulation.  Continue current management which includes use of a statin drug, beta-blocker, and long-acting nitrate.      Medication Adjustments/Labs and Tests Ordered: Current medicines are reviewed at length with the patient today.  Concerns regarding medicines are outlined above.  Orders Placed This Encounter  Procedures   EKG 12-Lead   No orders of the defined types were placed in this encounter.   Patient Instructions  Medication Instructions:  No medication changes were made at this visit. Continue current regimen.   *If you need a refill on your cardiac medications before your next appointment, please call your pharmacy*  Lab Work: None ordered today. If you have labs (blood work) drawn today and your tests are completely normal, you will receive your results only by: MyChart Message (if you have MyChart) OR A paper copy in the mail If you have any lab test that is abnormal or we need to change your treatment, we will call you to review the results.  Testing/Procedures: You have an echocardiogram scheduled for 05/14/24 at 11:20 AM (please arrive by 11AM).  Follow-Up: At [facility], you and your health needs are our priority.  As part of our  continuing mission to provide you with exceptional heart care, our providers are all part of one team.  This team includes your primary Cardiologist (physician) and Advanced Practice Providers or APPs (Physician Assistants and Nurse Practitioners) who all work together to provide you with the care you need, when you need it.  Your next appointment:   6 month(s)  Provider:   Dr. Wonda     Signed, Ozell Wonda, MD  04/21/2024 9:42 AM    [facility]

## 2024-04-21 NOTE — Assessment & Plan Note (Signed)
 Patient without anginal symptoms.  Not on antiplatelet therapy because of oral anticoagulation.  Continue current management which includes use of a statin drug, beta-blocker, and long-acting nitrate.

## 2024-04-30 DIAGNOSIS — J449 Chronic obstructive pulmonary disease, unspecified: Secondary | ICD-10-CM | POA: Diagnosis not present

## 2024-05-13 ENCOUNTER — Other Ambulatory Visit (HOSPITAL_COMMUNITY): Payer: Self-pay

## 2024-05-13 ENCOUNTER — Other Ambulatory Visit: Payer: Self-pay | Admitting: Physician Assistant

## 2024-05-13 MED ORDER — APIXABAN 5 MG PO TABS
5.0000 mg | ORAL_TABLET | Freq: Two times a day (BID) | ORAL | 5 refills | Status: DC
  Filled 2024-05-13: qty 60, 30d supply, fill #0
  Filled 2024-06-10: qty 60, 30d supply, fill #1
  Filled 2024-06-14: qty 60, 30d supply, fill #2

## 2024-05-13 NOTE — Telephone Encounter (Signed)
 Prescription refill request for Eliquis  received. Indication: Afib  Last office visit: [name] 04/21/2024 Scr: 1.06, 03/31/2024 Age: 73 yo  Weight:  77.4 kg   Refill sent.

## 2024-05-14 ENCOUNTER — Ambulatory Visit (HOSPITAL_COMMUNITY)
Admission: RE | Admit: 2024-05-14 | Discharge: 2024-05-14 | Disposition: A | Discharge status: Home / Self Care | Source: Ambulatory Visit | Attending: Physician Assistant | Admitting: Physician Assistant

## 2024-05-14 ENCOUNTER — Other Ambulatory Visit (HOSPITAL_COMMUNITY)

## 2024-05-14 DIAGNOSIS — T82857A Stenosis of cardiac prosthetic devices, implants and grafts, initial encounter: Secondary | ICD-10-CM | POA: Diagnosis not present

## 2024-05-17 ENCOUNTER — Ambulatory Visit: Admitting: Internal Medicine

## 2024-05-17 ENCOUNTER — Encounter: Payer: Self-pay | Admitting: Internal Medicine

## 2024-05-17 VITALS — BP 120/74 | HR 85 | Ht 76.0 in | Wt 166.0 lb

## 2024-05-17 DIAGNOSIS — E119 Type 2 diabetes mellitus without complications: Secondary | ICD-10-CM | POA: Insufficient documentation

## 2024-05-17 DIAGNOSIS — E1159 Type 2 diabetes mellitus with other circulatory complications: Secondary | ICD-10-CM

## 2024-05-17 DIAGNOSIS — Z794 Long term (current) use of insulin: Secondary | ICD-10-CM

## 2024-05-17 DIAGNOSIS — E1142 Type 2 diabetes mellitus with diabetic polyneuropathy: Secondary | ICD-10-CM | POA: Diagnosis not present

## 2024-05-17 LAB — ECHOCARDIOGRAM COMPLETE
AR max vel: 1.25 cm2
AV Area VTI: 1.1 cm2
AV Area mean vel: 1.16 cm2
AV Mean grad: 27 mmHg
AV Peak grad: 46.8 mmHg
Ao pk vel: 3.42 m/s
Area-P 1/2: 2.96 cm2
S' Lateral: 2.9 cm

## 2024-05-17 LAB — POCT GLYCOSYLATED HEMOGLOBIN (HGB A1C): Hemoglobin A1C: 6.9 % — AB (ref 4.0–5.6)

## 2024-05-17 MED ORDER — BD PEN NEEDLE NANO 2ND GEN 32G X 4 MM MISC: 1.0000 | Freq: Four times a day (QID) | 3 refills | Status: AC

## 2024-05-17 MED ORDER — PRODIGY NO CODING BLOOD GLUC VI STRP: 1.0000 | ORAL_STRIP | Freq: Two times a day (BID) | 3 refills | Status: AC

## 2024-05-17 MED ORDER — METFORMIN HCL 1000 MG PO TABS: 1000.0000 mg | ORAL_TABLET | Freq: Two times a day (BID) | ORAL | 3 refills | Status: AC

## 2024-05-17 MED ORDER — LANTUS SOLOSTAR 100 UNIT/ML ~~LOC~~ SOPN: 18.0000 [IU] | PEN_INJECTOR | Freq: Every day | SUBCUTANEOUS | 6 refills | Status: AC

## 2024-05-17 MED ORDER — NOVOLOG FLEXPEN 100 UNIT/ML ~~LOC~~ SOPN: PEN_INJECTOR | SUBCUTANEOUS | 4 refills | Status: AC

## 2024-05-17 NOTE — Patient Instructions (Signed)
-   Continue  Metformin 1000 mg, 1 tablet TWICE a day  - Continue  Lantus 18 units daily  - Continue  NOVOLOG 4 units with Lunch and 8 units with Supper  - Novlog correctional insulin: ADD extra units on insulin to your meal-time Novolog  dose if your blood sugars are higher than 185. Use the scale below to help guide you:    Blood sugar before meal Number of units to inject  Less than 185 0 unit  186 - 225 1 units  226 -  265 2 units  265 -  305 3 units  306 -  345 4 units

## 2024-05-17 NOTE — Progress Notes (Signed)
 Name: [name]  Age/ Sex: 73 y.o., male   MRN/ DOB: [id number], 1950-10-11     PCP: [name] Aloysius BRAVO, MD   Reason for Endocrinology Evaluation: Type 2 Diabetes Mellitus  Initial Endocrine Consultative Visit: 04/19/2020    PATIENT IDENTIFIER: [name] is a 73 y.o. male with a past medical history of COPD, non-obstructive CAD, severe AS ( S/P AVR/CABG), CHF, HTN , T2DM and OSA. The patient has followed with Endocrinology clinic since 04/19/2020 for consultative assistance with management of his diabetes.  DIABETIC HISTORY:  [name] was diagnosed with DM in 2009. He has been on metformin  and insulin  for years.Metfromin dose was reduced by 50% due to hypoglycemia per pt.  His hemoglobin A1c has ranged from 7.2% in 2021, peaking at 8.8%in 2018  On his initial visit to our clinic his A1c was 7.2 % We adjusted MDI regimen and continued metformin    Due to a low BMI of 20.99 we checked GAD-65 and islet cell Ab's which were negative    Prandial insulin  stopped 10/2020 but by  01/2021 he was already back on it and we continued   Farxiga  stopped due to genital rash 01/2021  SUBJECTIVE:   During the last visit (11/17/2023): A1c 6.6%      Today (05/17/2024): [name] is here for a follow up on diabetes. He checks his blood sugars a 2  times daily. The patient has not  had hypoglycemic episodes since the last clinic visit.  Patient follows with cardiology for CAD, ischemic cardiomyopathy and aortic stenosis He continues to follow-up with pulmonary for COPD  No nausea or vomiting  Has mild diarrhea but no constipation   Continues with SOB ,worsening in nature .    HOME DIABETES REGIMEN:  Lantus  18 units daily  Metformin  1000 mg , BID Novolog  4 units with lunch and 8 with supper  CF:Novolog  (BG- 135/40)     Statin: yes ACE-I/ARB: yes   METER DOWNLOAD SUMMARY: Prodigy unable to download  76- 186 mg/dl      DIABETIC COMPLICATIONS: Microvascular  complications:  Retinopathy Denies: CKD , neuropathy  Last eye exam: Completed 09/25/2023   Macrovascular complications:  CAD Denies: PVD, CVA   HISTORY:  Past Medical History:  Past Medical History:  Diagnosis Date   Abnormal myocardial perfusion study 2006   EF 44% ? inferoseptal ischemia. no cath done   Allergy    Benign neoplasm of cecum    Breast mass, right 03/2008   Cardiomyopathy (HCC) 07/20/2018   Ejection fraction 4045% in September 2019   Centrilobular emphysema (HCC) 08/25/2018   02/04/2018-high-res CT- no evidence of interstitial lung disease, severe centrilobular emphysema and mild diffuse bronchial wall thickening suggesting COPD, no calcified pleural plaques no pleural effusion, mild smooth bilateral pleural thickening which is nonspecific  02/04/2018-pulmonary function test- FVC 3.37 (71% predicted), postbronchodilator ratio 50, postbronchodilator FEV1 54, mid flow reve   CHF (congestive heart failure) (HCC)    COLONIC POLYPS, HX OF 03/18/2007   Qualifier: Diagnosis of  By: [name]    Contact dermatitis 01/31/2013   COPD (chronic obstructive pulmonary disease) (HCC)    dx'd 12/2017   COPD GOLD II A  08/25/2018   02/04/2018-high-res CT- no evidence of interstitial lung disease, severe centrilobular emphysema and mild diffuse bronchial wall thickening suggesting COPD, no calcified pleural plaques no pleural effusion, mild smooth bilateral pleural thickening which is nonspecific  02/04/2018-pulmonary function test- FVC 3.37 (71% predicted), postbronchodilator ratio 50, postbronchodilator  FEV1 54, mid flow reve   Coronary artery disease    Critical aortic valve stenosis 01/12/2018   Calculated valve area less than 0.5 cm mean gradient more than 60 mm of mercury   DEGENERATIVE JOINT DISEASE, BACK 03/18/2007   Qualifier: Diagnosis of  By: [name]    Depressed left ventricular ejection fraction 03/28/2020   Diabetes mellitus (HCC) 07/25/2020    Dyslipidemia 04/20/2020   Dyspnea on exertion 01/12/2018   Emphysema of lung (HCC)    ERECTILE DYSFUNCTION, ORGANIC 02/10/2009   Qualifier: Diagnosis of  By: [name]    Former cigarette smoker 10/12/2018   Quit in 2012 41-pack-year smoking history   Goals of care, counseling/discussion 08/24/2020   H/O aortic valve replacement 05/05/2020   Heart murmur    HEMORRHOIDS, INTERNAL 03/18/2007   Qualifier: Diagnosis of  By: [name]    HTN (hypertension) 10/21/2007        Hyperlipemia 10/21/2007   Qualifier: Diagnosis of  By: [name]    Hypertension    Hypoglycemia due to insulin  03/26/2020   Low back pain    MYOCARDIAL PERFUSION SCAN, WITH STRESS TEST, ABNORMAL 08/11/2008   Qualifier: Diagnosis of  By: Delford, MD, CODY Maude Dunnings    Nocturnal hypoxemia 08/25/2018   04/09/18 >>> ONO - telephone note>>> Patient's ONO on room air showed that he was less than 88% for more than an hour. Patient needs to be on 2 L at HS.  10/06/2018-overnight oximetry-entire duration was 4 hours and 23 minutes, SPO2 less than 88% for 25 minutes and 32 seconds     Oxygen  deficiency    uses 2L at night   Paroxysmal atrial fibrillation (HCC) 10/12/2018   PCP NOTES >>>>>>>>>>>>>>>>> 05/14/2020   Polycythemia vera (HCC) 08/04/2020   S/P AVR 03/04/2018   AVR 23 mm Edwards Magna Valve - bioprostetic 03/04/2018 LIMA - LAD   Status post coronary artery bypass graft 03/20/2018   LIMA to LAD during aortic valve replacement surgery 03/04/2018   Type 2 diabetes mellitus with hyperglycemia, with long-term current use of insulin  (HCC) 04/20/2020   Type 2 diabetes mellitus, uncontrolled, with neuropathy 05/20/2008   Qualifier: Diagnosis of  By: [name]    Past Surgical History:  Past Surgical History:  Procedure Laterality Date   ABDOMINAL AORTOGRAM N/A 01/13/2018   Procedure: ABDOMINAL AORTOGRAM;  Surgeon: Burnard Debby LABOR, MD;  Location: [facility] INVASIVE CV LAB;  Service:  Cardiovascular;  Laterality: N/A;   AORTIC VALVE REPLACEMENT N/A 03/04/2018   Procedure: AORTIC VALVE REPLACEMENT (AVR) 23mm Edwards Magna Ease Tissue Valve.;  Surgeon: Kerrin Elspeth BROCKS, MD;  Location: [facility] OR;  Service: Open Heart Surgery;  Laterality: N/A;   CARDIAC VALVE REPLACEMENT     COLONOSCOPY W/ POLYPECTOMY     COLONOSCOPY WITH PROPOFOL  N/A 02/22/2020   Procedure: COLONOSCOPY WITH PROPOFOL ;  Surgeon: Legrand Victory LITTIE DOUGLAS, MD;  Location: [facility] ENDOSCOPY;  Service: Gastroenterology;  Laterality: N/A;   CORONARY ARTERY BYPASS GRAFT N/A 03/04/2018   Procedure: CORONARY ARTERY BYPASS GRAFTING (CABG) x1:  LIMA to LAD.;  Surgeon: Kerrin Elspeth BROCKS, MD;  Location: [facility] OR;  Service: Open Heart Surgery;  Laterality: N/A;   CORONARY PRESSURE/FFR STUDY N/A 05/10/2020   Procedure: INTRAVASCULAR PRESSURE WIRE/FFR STUDY;  Surgeon: Wonda Sharper, MD;  Location: [facility] INVASIVE CV LAB;  Service: Cardiovascular;  Laterality: N/A;   HEMOSTASIS CLIP PLACEMENT  02/22/2020   Procedure: HEMOSTASIS CLIP PLACEMENT;  Surgeon: Legrand Victory  L III, MD;  Location: [facility] ENDOSCOPY;  Service: Gastroenterology;;   LEFT HEART CATH AND CORS/GRAFTS ANGIOGRAPHY N/A 05/10/2020   Procedure: LEFT HEART CATH AND CORS/GRAFTS ANGIOGRAPHY;  Surgeon: Wonda Sharper, MD;  Location: [facility] INVASIVE CV LAB;  Service: Cardiovascular;  Laterality: N/A;   LIPOMA EXCISION Left 07/2008    back of shoulder Dr Nalani   POLYPECTOMY  02/22/2020   Procedure: POLYPECTOMY;  Surgeon: Legrand Victory LITTIE DOUGLAS, MD;  Location: [facility] ENDOSCOPY;  Service: Gastroenterology;;   RIGHT/LEFT HEART CATH AND CORONARY ANGIOGRAPHY N/A 01/13/2018   Procedure: RIGHT/LEFT HEART CATH AND CORONARY ANGIOGRAPHY;  Surgeon: Burnard Debby LABOR, MD;  Location: [facility] INVASIVE CV LAB;  Service: Cardiovascular;  Laterality: N/A;   TEE WITHOUT CARDIOVERSION N/A 03/04/2018   Procedure: TRANSESOPHAGEAL ECHOCARDIOGRAM (TEE);  Surgeon: Kerrin Elspeth BROCKS, MD;  Location: [facility] OR;  Service: Open Heart  Surgery;  Laterality: N/A;   TONSILLECTOMY     WISDOM TOOTH EXTRACTION     Social History:  reports that he quit smoking about 25 years ago. His smoking use included cigarettes. He started smoking about 52 years ago. He has a 40.5 pack-year smoking history. He has never used smokeless tobacco. He reports current alcohol use of about 2.0 standard drinks of alcohol per week. He reports that he does not currently use drugs. Family History:  Family History  Problem Relation Age of Onset   Melanoma Father 18       deceased secondary to melanoma   Alzheimer's disease Father    Diabetes Father    Hypertension Mother        alive -63   Colon cancer Neg Hx    Esophageal cancer Neg Hx    Colon polyps Neg Hx    Stomach cancer Neg Hx      HOME MEDICATIONS: Allergies as of 05/17/2024       Reactions   Farxiga  [dapagliflozin ] Rash, Other (See Comments)   swollen penis   Lisinopril  Other (See Comments)   Hyperkalemia   Amlodipine Besylate Other (See Comments)   REACTION: ? caused left axillary nodules/chest flutter   Hydrochlorothiazide Hives        Medication List        Accurate as of May 17, 2024 10:43 AM. If you have any questions, ask your nurse or doctor.          acetaminophen  500 MG tablet Commonly known as: TYLENOL  Take 1,000 mg by mouth every 6 (six) hours as needed for moderate pain or headache.   amoxicillin  500 MG capsule Commonly known as: AMOXIL  Take 4 tablets by mouth 1 hour before dental procedure or cleaning.   BD Pen Needle Nano 2nd Gen 32G X 4 MM Misc Generic drug: Insulin  Pen Needle Inject 1 Device into the skin in the morning, at noon, in the evening, and at bedtime.   budesonide -formoterol  160-4.5 MCG/ACT inhaler Commonly known as: Symbicort  Inhale 2 puffs into the lungs in the morning and at bedtime.   cetirizine  10 MG tablet Commonly known as: ZYRTEC  Take 10 mg by mouth daily as needed for allergies.   Eliquis  5 MG Tabs  tablet Generic drug: apixaban  Take 1 tablet (5 mg total) by mouth 2 (two) times daily.   hydrALAZINE  50 MG tablet Commonly known as: APRESOLINE  TAKE 1 TABLET BY MOUTH THREE TIMES A DAY   isosorbide  mononitrate 30 MG 24 hr tablet Commonly known as: IMDUR  TAKE 1 TABLET BY MOUTH EVERY DAY   Lantus  SoloStar 100 UNIT/ML Solostar Pen Generic drug:  insulin  glargine Inject 18 Units into the skin daily.   metFORMIN  1000 MG tablet Commonly known as: GLUCOPHAGE  Take 1 tablet (1,000 mg total) by mouth 2 (two) times daily with a meal.   metoprolol  succinate 50 MG 24 hr tablet Commonly known as: TOPROL -XL TAKE 1 TABLET BY MOUTH EVERY DAY   NovoLOG  FlexPen 100 UNIT/ML FlexPen Generic drug: insulin  aspart Max daily 30 units   OXYGEN  Inhale 2 L/min into the lungs continuous. Use at night   Prodigy No Coding Blood Gluc test strip Generic drug: glucose blood Use as instructed to check blood sugar twice a day.  DX  E11.40   rosuvastatin  20 MG tablet Commonly known as: CRESTOR  Take 1 tablet (20 mg total) by mouth daily.   sacubitril -valsartan  97-103 MG Commonly known as: Entresto  Take 1 tablet by mouth 2 (two) times daily.   VITAMIN B-12 PO Take 1 tablet by mouth daily.         OBJECTIVE:   Vital Signs: BP 120/74 (BP Location: Left Arm, Patient Position: Sitting, Cuff Size: Normal)   Pulse 85   Ht 6' 4 (1.93 m)   Wt 166 lb (75.3 kg)   SpO2 98%   BMI 20.21 kg/m   Wt Readings from Last 3 Encounters:  05/17/24 166 lb (75.3 kg)  04/21/24 170 lb 9.6 oz (77.4 kg)  04/14/24 170 lb (77.1 kg)     Exam: General: Pt appears well and is in NAD  Lungs: Clear with good BS bilat  Heart: RRR   Extremities: No pretibial edema.   Neuro: MS is good with appropriate affect, pt is alert and Ox3       DM Foot Exam 05/17/2024  The skin of the feet is intact without sores or ulcerations. The pedal pulses are 1+ on right and 1+ on left. The sensation is decreased  to a screening  5.07, 10 gram monofilament bilaterally    DATA REVIEWED:  Lab Results  Component Value Date   HGBA1C 6.6 (A) 11/17/2023   HGBA1C 6.7 (A) 05/16/2023   HGBA1C 7.0 (A) 11/12/2022     Latest Reference Range & Units 03/31/24 10:05  Sodium 135 - 145 mmol/L 136  Potassium 3.5 - 5.1 mmol/L 4.5  Chloride 98 - 111 mmol/L 102  CO2 22 - 32 mmol/L 23  Glucose 70 - 99 mg/dL 760 (H)  BUN 8 - 23 mg/dL 15  Creatinine 9.38 - 8.75 mg/dL 8.93  Calcium  8.9 - 10.3 mg/dL 9.3  Anion gap 5 - 15  12  Alkaline Phosphatase 38 - 126 U/L 48  Albumin  3.5 - 5.0 g/dL 4.2  AST 15 - 41 U/L 19  ALT 0 - 44 U/L 8  Total Protein 6.5 - 8.1 g/dL 7.1  Total Bilirubin 0.0 - 1.2 mg/dL 0.5  GFR, Est Non African American >60 mL/min >60  (H): Data is abnormally high ISLET CELL ANTIBODY SCREEN NEGATIVE NEGATIVE    Glutamic Acid Decarb Ab <5 IU/mL <5     Old records , labs and images have been reviewed.   ASSESSMENT / PLAN / RECOMMENDATIONS:   1) Type 2 Diabetes Mellitus, Optimally controlled, With neuropathic and macrovascular  complications - Most recent A1c of 6.9%. Goal A1c < 7.0 %.     - A1c at goal  - He had to stop the Farxiga  due to severe genital infection/irritation -He had declined GLP-1 agonist and CGM technology in the past -No changes in insulin  doses at this time, but the patient was advised  to check glucose before meals rather than after the meals, he also was advised to take prandial insulin  before the meal rather than after the meal as the patient has been checking glucose after supper and for the past 2 weeks has been taking NovoLog  after the meal - We also discussed the do's and don'ts for footcare, as the patient does have decreased sensation at the forefeet    MEDICATIONS:  - Continue Metformin  1000 mg, 1 tablet BID - Continue  Lantus   18 units daily  - Continue Novolog  4 units with lunch and 8 units with supper  - Continue RQ:wncnnonh (BG- 135/40)  EDUCATION / INSTRUCTIONS: BG  monitoring instructions: Patient is instructed to check his blood sugars 3 times a day, before meals  Call [facility] if: BG persistently < 100  I reviewed the Rule of 15 for the treatment of hypoglycemia in detail with the patient. Literature supplied.     2) Diabetic complications:  Eye: Does not have known diabetic retinopathy.  Neuro/ Feet: Does have known diabetic peripheral neuropathy .  Renal: Patient does not have known baseline CKD. He   is  on an ACEI/ARB at present.   F/U in 6 months    I spent 25 minutes preparing to see the patient by review of recent labs, imaging and procedures, obtaining and reviewing separately obtained history, communicating with the patient/family or caregiver, ordering medications, tests or procedures, and documenting clinical information in the EHR including the differential Dx, treatment, and any further evaluation and other management    Signed electronically by: [name] Redgie Butts, MD  [facility]  [facility] [address] [place], [place] [address] Phone: [phone] FAX: [phone]   CC: [name] Aloysius BRAVO, MD [address] [place] [address] Phone: [phone]  Fax: [phone]  Return to Endocrinology clinic as below: Future Appointments  Date Time Provider Department Center  05/17/2024 11:10 AM Bentlee [place], Donell Redgie, MD [facility] None  06/08/2024  3:00 PM Legrand Victory LITTIE DOUGLAS, MD [facility]  07/12/2024  1:00 PM [name] Aloysius BRAVO, MD [facility]  10/01/2024 10:15 AM [facility] LAB [facility] None  10/01/2024 10:30 AM Timmy Maude SAUNDERS, MD [facility] None  10/01/2024 10:45 AM [facility] INFUSION [facility] None  04/15/2025 10:20 AM [facility] ANNUAL WELLNESS VISIT 1 [facility] [address]

## 2024-05-18 ENCOUNTER — Ambulatory Visit: Payer: Self-pay | Admitting: Physician Assistant

## 2024-05-19 ENCOUNTER — Ambulatory Visit: Payer: Self-pay | Admitting: Pulmonary Disease

## 2024-05-19 ENCOUNTER — Ambulatory Visit (HOSPITAL_BASED_OUTPATIENT_CLINIC_OR_DEPARTMENT_OTHER)

## 2024-05-19 ENCOUNTER — Encounter (HOSPITAL_BASED_OUTPATIENT_CLINIC_OR_DEPARTMENT_OTHER): Payer: Self-pay

## 2024-05-19 VITALS — BP 132/72 | HR 86 | Ht 76.0 in | Wt 165.0 lb

## 2024-05-19 DIAGNOSIS — Z87891 Personal history of nicotine dependence: Secondary | ICD-10-CM

## 2024-05-19 DIAGNOSIS — J449 Chronic obstructive pulmonary disease, unspecified: Secondary | ICD-10-CM

## 2024-05-19 MED ORDER — ALBUTEROL SULFATE HFA 108 (90 BASE) MCG/ACT IN AERS: 2.0000 | INHALATION_SPRAY | Freq: Four times a day (QID) | RESPIRATORY_TRACT | 6 refills | Status: DC | PRN

## 2024-05-19 NOTE — Telephone Encounter (Signed)
 FYI

## 2024-05-19 NOTE — Patient Instructions (Signed)
 Continue Symbicort  2 puffs inhaled twice daily.  Start Albuterol  MDI:  use every 4-6 hours as needed for shortness of breath.  May use prior to an activity known to cause difficulty breathing.  Complete PFTs as ordered; follow up afterwards to review with Dr. Theophilus.

## 2024-05-19 NOTE — Progress Notes (Signed)
 @Patient  ID: [name], male    DOB: 15-Dec-1950, 73 y.o.   MRN: [id number]  Chief Complaint  Patient presents with   Shortness of Breath    Acute Visit     Referring provider: Amon Aloysius BRAVO, MD  HPI: [name] HILARIO [name] is a 73 y/o male with PMH of HTN, CAD, cardiomyopathy, critical aortic valve stenosis, PAF, DM II, and COPD who presents today as an acute visit.  He was last seen in our office on 03/12/2023.  He presents today with c/o progressive shortness of breath over the last couple of months.He reports that he has been taking Symbicort  2 puffs inhaled twice a day for the last 1 to 2 months.  He does not have a rescue inhaler presently.  After review of his last office notes it appears that he had been on Stiolto but did not tolerate this well and was transition to Breztri .  At some point he had stopped this medication and the Symbicort  is now new to his regimen.  He is also currently undergoing a cardiac workup for complaints of shortness of breath.  He denies fever, chills, productive cough, chest pain, hemoptysis.  He mostly reports that he has had limitation in his ADLs and he is unable to cut his own grass which is unusual for him.  We discussed the manner in which he takes his Symbicort  but he was unable to demonstrate today.  TEST/EVENTS : 02/10/2024 CTA Chest:  Lungs/Pleura: Severe centrilobular emphysema. No focal airspace disease or lung consolidation. No pleural effusions. No suspicious pulmonary nodules.  LDCT 08/2023: COMPARISON:  08/22/2022 screening chest CT   FINDINGS: Cardiovascular: Normal heart size. No significant pericardial effusion/thickening. Three-vessel coronary atherosclerosis status post CABG. Atherosclerotic nonaneurysmal thoracic aorta. Normal caliber pulmonary arteries.   Mediastinum/Nodes: No significant thyroid  nodules. Unremarkable esophagus. No pathologically enlarged axillary, mediastinal or hilar lymph nodes, noting limited sensitivity  for the detection of hilar adenopathy on this noncontrast study.   Lungs/Pleura: No pneumothorax. No pleural effusion. Severe centrilobular emphysema with diffuse bronchial wall thickening. No acute consolidative airspace disease or lung masses. No significant growth of previously visualized tiny right pulmonary nodules up to 3.7 mm in the peripheral right upper lobe on series 9/image 153. No new significant pulmonary nodules.   Upper abdomen: Stable subcentimeter hypodense posterior right liver lesion, too small to characterize, presumably benign. Simple upper renal cysts bilaterally, largest 2.1 cm on the right, for which no follow-up imaging is recommended.   Musculoskeletal: No aggressive appearing focal osseous lesions. Mild thoracic spondylosis. Intact sternotomy wires. Symmetric moderate gynecomastia, similar.   IMPRESSION: 1. Lung-RADS 2, benign appearance or behavior. Continue annual screening with low-dose chest CT without contrast in 12 months. 2. Symmetric moderate gynecomastia, similar. 3. Aortic Atherosclerosis (ICD10-I70.0) and Emphysema (ICD10-J43.9). Allergies  Allergen Reactions   Farxiga  [Dapagliflozin ] Rash and Other (See Comments)    swollen penis   Lisinopril  Other (See Comments)    Hyperkalemia    Amlodipine Besylate Other (See Comments)    REACTION: ? caused left axillary nodules/chest flutter   Hydrochlorothiazide Hives    Immunization History  Administered Date(s) Administered   Fluad Quad(high Dose 65+) 05/26/2019, 05/12/2020, 06/04/2021, 06/05/2022   Fluad Trivalent(High Dose 65+) 06/18/2023   INFLUENZA, HIGH DOSE SEASONAL PF 05/20/2017, 06/10/2018   Influenza Split 08/04/2012   Influenza Whole 05/20/2008, 07/12/2009, 05/02/2010   Influenza,inj,Quad PF,6+ Mos 05/07/2013, 05/09/2014, 06/07/2015, 05/01/2016   PFIZER(Purple Top)SARS-COV-2 Vaccination 10/08/2019, 11/03/2019, 11/16/2019, 12/14/2019, 06/29/2020   PNEUMOCOCCAL CONJUGATE-20  10/21/2023  Pneumococcal Conjugate-13 02/05/2017   Pneumococcal Polysaccharide-23 05/20/2008, 08/25/2018   Td 08/26/2008    Past Medical History:  Diagnosis Date   Abnormal myocardial perfusion study 2006   EF 44% ? inferoseptal ischemia. no cath done   Allergy    Benign neoplasm of cecum    Breast mass, right 03/2008   Cardiomyopathy (HCC) 07/20/2018   Ejection fraction 4045% in September 2019   Centrilobular emphysema (HCC) 08/25/2018   02/04/2018-high-res CT- no evidence of interstitial lung disease, severe centrilobular emphysema and mild diffuse bronchial wall thickening suggesting COPD, no calcified pleural plaques no pleural effusion, mild smooth bilateral pleural thickening which is nonspecific  02/04/2018-pulmonary function test- FVC 3.37 (71% predicted), postbronchodilator ratio 50, postbronchodilator FEV1 54, mid flow reve   CHF (congestive heart failure) (HCC)    COLONIC POLYPS, HX OF 03/18/2007   Qualifier: Diagnosis of  By: [name]    Contact dermatitis 01/31/2013   COPD (chronic obstructive pulmonary disease) (HCC)    dx'd 12/2017   COPD GOLD II A  08/25/2018   02/04/2018-high-res CT- no evidence of interstitial lung disease, severe centrilobular emphysema and mild diffuse bronchial wall thickening suggesting COPD, no calcified pleural plaques no pleural effusion, mild smooth bilateral pleural thickening which is nonspecific  02/04/2018-pulmonary function test- FVC 3.37 (71% predicted), postbronchodilator ratio 50, postbronchodilator FEV1 54, mid flow reve   Coronary artery disease    Critical aortic valve stenosis 01/12/2018   Calculated valve area less than 0.5 cm mean gradient more than 60 mm of mercury   DEGENERATIVE JOINT DISEASE, BACK 03/18/2007   Qualifier: Diagnosis of  By: [name]    Depressed left ventricular ejection fraction 03/28/2020   Diabetes mellitus (HCC) 07/25/2020   Dyslipidemia 04/20/2020   Dyspnea on exertion 01/12/2018    Emphysema of lung (HCC)    ERECTILE DYSFUNCTION, ORGANIC 02/10/2009   Qualifier: Diagnosis of  By: [name]    Former cigarette smoker 10/12/2018   Quit in 2012 41-pack-year smoking history   Goals of care, counseling/discussion 08/24/2020   H/O aortic valve replacement 05/05/2020   Heart murmur    HEMORRHOIDS, INTERNAL 03/18/2007   Qualifier: Diagnosis of  By: [name]    HTN (hypertension) 10/21/2007        Hyperlipemia 10/21/2007   Qualifier: Diagnosis of  By: [name]    Hypertension    Hypoglycemia due to insulin  03/26/2020   Low back pain    MYOCARDIAL PERFUSION SCAN, WITH STRESS TEST, ABNORMAL 08/11/2008   Qualifier: Diagnosis of  By: Delford, MD, CODY Maude Dunnings    Nocturnal hypoxemia 08/25/2018   04/09/18 >>> ONO - telephone note>>> Patient's ONO on room air showed that he was less than 88% for more than an hour. Patient needs to be on 2 L at HS.  10/06/2018-overnight oximetry-entire duration was 4 hours and 23 minutes, SPO2 less than 88% for 25 minutes and 32 seconds     Oxygen  deficiency    uses 2L at night   Paroxysmal atrial fibrillation (HCC) 10/12/2018   PCP NOTES >>>>>>>>>>>>>>>>> 05/14/2020   Polycythemia vera (HCC) 08/04/2020   S/P AVR 03/04/2018   AVR 23 mm Edwards Magna Valve - bioprostetic 03/04/2018 LIMA - LAD   Status post coronary artery bypass graft 03/20/2018   LIMA to LAD during aortic valve replacement surgery 03/04/2018   Type 2 diabetes mellitus with hyperglycemia, with long-term current use of insulin  (HCC) 04/20/2020   Type 2 diabetes  mellitus, uncontrolled, with neuropathy 05/20/2008   Qualifier: Diagnosis of  By: [name]     Tobacco History: Social History   Tobacco Use  Smoking Status Former   Current packs/day: 0.00   Average packs/day: 1.5 packs/day for 27.0 years (40.5 ttl pk-yrs)   Types: Cigarettes   Start date: 44   Quit date: 2000   Years since quitting: 25.7  Smokeless Tobacco Never   Tobacco Comments   Smoked heavy from 1973 until 2000, does not qualify for screening   Counseling given: Not Answered Tobacco comments: Smoked heavy from 1973 until 2000, does not qualify for screening   Outpatient Medications Prior to Visit  Medication Sig Dispense Refill   acetaminophen  (TYLENOL ) 500 MG tablet Take 1,000 mg by mouth every 6 (six) hours as needed for moderate pain or headache.     amoxicillin  (AMOXIL ) 500 MG capsule Take 4 tablets by mouth 1 hour before dental procedure or cleaning. 12 capsule 3   apixaban  (ELIQUIS ) 5 MG TABS tablet Take 1 tablet (5 mg total) by mouth 2 (two) times daily. 60 tablet 5   budesonide -formoterol  (SYMBICORT ) 160-4.5 MCG/ACT inhaler Inhale 2 puffs into the lungs in the morning and at bedtime. 10.2 g 5   cetirizine  (ZYRTEC ) 10 MG tablet Take 10 mg by mouth daily as needed for allergies.     Cyanocobalamin  (VITAMIN B-12 PO) Take 1 tablet by mouth daily.     glucose blood (PRODIGY NO CODING BLOOD GLUC) test strip 1 each by Other route 2 (two) times daily. Use as instructed to check blood sugar twice a day.  DX  E11.40 200 each 3   hydrALAZINE  (APRESOLINE ) 50 MG tablet TAKE 1 TABLET BY MOUTH THREE TIMES A DAY 270 tablet 3   insulin  aspart (NOVOLOG  FLEXPEN) 100 UNIT/ML FlexPen Max daily 30 units 30 mL 4   insulin  glargine (LANTUS  SOLOSTAR) 100 UNIT/ML Solostar Pen Inject 18 Units into the skin daily. 15 mL 6   Insulin  Pen Needle (BD PEN NEEDLE NANO 2ND GEN) 32G X 4 MM MISC Inject 1 Device into the skin in the morning, at noon, in the evening, and at bedtime. 400 each 3   isosorbide  mononitrate (IMDUR ) 30 MG 24 hr tablet TAKE 1 TABLET BY MOUTH EVERY DAY 90 tablet 3   metFORMIN  (GLUCOPHAGE ) 1000 MG tablet Take 1 tablet (1,000 mg total) by mouth 2 (two) times daily with a meal. 180 tablet 3   metoprolol  succinate (TOPROL -XL) 50 MG 24 hr tablet TAKE 1 TABLET BY MOUTH EVERY DAY 90 tablet 3   OXYGEN  Inhale 2 L/min into the lungs continuous. Use at night      rosuvastatin  (CRESTOR ) 20 MG tablet Take 1 tablet (20 mg total) by mouth daily. 90 tablet 1   sacubitril -valsartan  (ENTRESTO ) 97-103 MG Take 1 tablet by mouth 2 (two) times daily. 180 tablet 2   No facility-administered medications prior to visit.     Review of Systems: Positive as per HPI  Constitutional:   No  weight loss, night sweats,  Fevers, chills, fatigue, or  lassitude.  HEENT:   No headaches,  Difficulty swallowing,  Tooth/dental problems, or  Sore throat,                No sneezing, itching, ear ache, nasal congestion, post nasal drip,   CV:  No chest pain,  Orthopnea, PND, swelling in lower extremities, anasarca, dizziness, palpitations, syncope.   GI  No heartburn, indigestion, abdominal pain, nausea, vomiting, diarrhea, change  in bowel habits, loss of appetite, bloody stools.   Resp: No shortness of breath with exertion or at rest.  No excess mucus, no productive cough,  No non-productive cough,  No coughing up of blood.  No change in color of mucus.  No wheezing.  No chest wall deformity  Skin: no rash or lesions.  GU: no dysuria, change in color of urine, no urgency or frequency.  No flank pain, no hematuria   MS:  No joint pain or swelling.  No decreased range of motion.  No back pain.    Physical Exam  BP 132/72   Pulse 86   Ht 6' 4 (1.93 m)   Wt 165 lb (74.8 kg)   SpO2 90%   BMI 20.08 kg/m   GEN: A/Ox3; pleasant , NAD, well nourished    HEENT:  [place]/AT,  EACs-clear, TMs-wnl, NOSE-clear, THROAT-clear, no lesions, no postnasal drip or exudate noted.   NECK:  Supple w/ fair ROM; no JVD; normal carotid impulses w/o bruits; no thyromegaly or nodules palpated; no lymphadenopathy.    RESP  Clear but diminished P & A; w/o, wheezes/ rales/ or rhonchi. no accessory muscle use, no dullness to percussion  CARD:  RRR, no m/r/g, no peripheral edema, pulses intact, no cyanosis or clubbing.  GI:   Soft & nt; nml bowel sounds; no organomegaly or masses detected.    Musco: Warm bil, no deformities or joint swelling noted.   Neuro: alert, no focal deficits noted.    Skin: Warm, no lesions or rashes    Lab Results:  CBC    Component Value Date/Time   WBC 8.4 04/12/2024 1409   RBC 6.19 (H) 04/12/2024 1409   HGB 10.4 (L) 04/12/2024 1409   HGB 11.1 (L) 03/31/2024 1005   HGB 12.4 (L) 01/29/2024 1351   HCT 34.9 (L) 04/12/2024 1409   HCT 46.8 01/29/2024 1351   PLT 300.0 R 04/12/2024 1409   PLT 306 03/31/2024 1005   PLT 416 01/29/2024 1351   MCV 56.5 (L) 04/12/2024 1409   MCV 64 (L) 01/29/2024 1351   MCH 16.5 (L) 03/31/2024 1005   MCHC 29.8 (L) 04/12/2024 1409   RDW 24.0 (H) 04/12/2024 1409   RDW 24.2 (H) 01/29/2024 1351   LYMPHSABS 1.4 R 04/12/2024 1409   MONOABS 0.7 R 04/12/2024 1409   EOSABS 0.1 R 04/12/2024 1409   BASOSABS 0.0 R 04/12/2024 1409    BMET    Component Value Date/Time   NA 136 03/31/2024 1005   NA 140 01/29/2024 1351   K 4.5 03/31/2024 1005   CL 102 03/31/2024 1005   CO2 23 03/31/2024 1005   GLUCOSE 239 (H) 03/31/2024 1005   BUN 15 03/31/2024 1005   BUN 13 01/29/2024 1351   CREATININE 1.06 03/31/2024 1005   CREATININE 0.84 11/30/2013 0856   CALCIUM  9.3 03/31/2024 1005   GFRNONAA >60 03/31/2024 1005   GFRNONAA >89 10/26/2013 0906   GFRAA 102 06/07/2020 1111   GFRAA >89 10/26/2013 0906    BNP    Component Value Date/Time   BNP 48.6 01/29/2024 1351   BNP 76.4 09/05/2020 1256    ProBNP No results found for: PROBNP  Imaging: ECHOCARDIOGRAM COMPLETE Result Date: 05/17/2024    ECHOCARDIOGRAM REPORT   Patient Name:   MIKAEEL PETROW Date of Exam: 05/14/2024 Medical Rec #:  [id number]          Height:       76.0 in Accession #:    [id number]  Weight:       170.6 lb Date of Birth:  Jul 15, 1951         BSA:          2.070 m Patient Age:    72 years           BP:           142/86 mmHg Patient Gender: M                  HR:           84 bpm. Exam Location:  [address] Procedure: 2D Echo, Cardiac  Doppler, Color Doppler and 3D Echo (Both Spectral            and Color Flow Doppler were utilized during procedure). Indications:     Prosthetic Aortic Valve Stenosis T82.857A  History:         Patient has prior history of Echocardiogram examinations, most                  recent 01/26/2024. CHF, CAD, Prior CABG, COPD, Arrythmias:Atrial                  Fibrillation; Risk Factors:Diabetes, Dyslipidemia and                  Hypertension.                  Aortic Valve: 23 mm [facility] Ease valve is present in the                  aortic position. Procedure Date: 03/04/2018.  Sonographer:     Augustin Seals RDCS Referring Phys:  [id number] [facility] R THOMPSON Diagnosing Phys: Toribio Fuel MD IMPRESSIONS  1. Left ventricular ejection fraction, by estimation, is 65 to 70%. Left ventricular ejection fraction by 3D volume is 59 %. The left ventricle has normal function. The left ventricle has no regional wall motion abnormalities. Left ventricular diastolic  parameters are consistent with Grade I diastolic dysfunction (impaired relaxation).  2. Right ventricular systolic function is normal. The right ventricular size is normal. There is mildly elevated pulmonary artery systolic pressure.  3. The mitral valve is normal in structure. Trivial mitral valve regurgitation. No evidence of mitral stenosis.  4. The aortic valve has been repaired/replaced. There is moderate calcification of the aortic valve. Aortic valve regurgitation is not visualized. Moderate aortic valve stenosis. There is a 23 mm [employer] valve present in the aortic position. Procedure Date: 03/04/2018. Aortic valve mean gradient measures 27.0 mmHg. Aortic valve Vmax measures 3.42 m/s.  5. The inferior vena cava is dilated in size with >50% respiratory variability, suggesting right atrial pressure of 8 mmHg. Conclusion(s)/Recommendation(s): No significant change in prosthetic aortic valve gradient (moderate AS) since previos study 01/26/24. FINDINGS   Left Ventricle: Left ventricular ejection fraction, by estimation, is 65 to 70%. Left ventricular ejection fraction by 3D volume is 59 %. The left ventricle has normal function. The left ventricle has no regional wall motion abnormalities. The left ventricular internal cavity size was normal in size. There is no left ventricular hypertrophy. Left ventricular diastolic parameters are consistent with Grade I diastolic dysfunction (impaired relaxation). Right Ventricle: The right ventricular size is normal. No increase in right ventricular wall thickness. Right ventricular systolic function is normal. There is mildly elevated pulmonary artery systolic pressure. The tricuspid regurgitant velocity is 2.91  m/s, and with an assumed right atrial pressure of 8 mmHg, the  estimated right ventricular systolic pressure is 41.9 mmHg. Left Atrium: Left atrial size was normal in size. Right Atrium: Right atrial size was normal in size. Pericardium: There is no evidence of pericardial effusion. Mitral Valve: The mitral valve is normal in structure. Trivial mitral valve regurgitation. No evidence of mitral valve stenosis. Tricuspid Valve: The tricuspid valve is normal in structure. Tricuspid valve regurgitation is trivial. No evidence of tricuspid stenosis. Aortic Valve: The aortic valve has been repaired/replaced. There is moderate calcification of the aortic valve. Aortic valve regurgitation is not visualized. Moderate aortic stenosis is present. Aortic valve mean gradient measures 27.0 mmHg. Aortic valve  peak gradient measures 46.8 mmHg. Aortic valve area, by VTI measures 1.10 cm. There is a 23 mm [employer] valve present in the aortic position. Procedure Date: 03/04/2018. Pulmonic Valve: The pulmonic valve was normal in structure. Pulmonic valve regurgitation is trivial. No evidence of pulmonic stenosis. Aorta: The aortic root is normal in size and structure. Venous: The inferior vena cava is dilated in size with  greater than 50% respiratory variability, suggesting right atrial pressure of 8 mmHg. IAS/Shunts: No atrial level shunt detected by color flow Doppler. Additional Comments: 3D was performed not requiring image post processing on an independent workstation and was normal.  LEFT VENTRICLE PLAX 2D LVIDd:         4.40 cm         Diastology LVIDs:         2.90 cm         LV e' medial:    7.62 cm/s LV PW:         1.10 cm         LV E/e' medial:  9.4 LV IVS:        1.00 cm         LV e' lateral:   8.92 cm/s LVOT diam:     2.00 cm         LV E/e' lateral: 8.0 LV SV:         89 LV SV Index:   43 LVOT Area:     3.14 cm        3D Volume EF                                LV 3D EF:    Left                                             ventricul                                             ar                                             ejection                                             fraction  by 3D                                             volume is                                             59 %.                                 3D Volume EF:                                3D EF:        59 %                                LV EDV:       158 ml                                LV ESV:       65 ml                                LV SV:        93 ml RIGHT VENTRICLE            IVC RV Basal diam:  3.60 cm    IVC diam: 2.50 cm RV Mid diam:    3.20 cm RV S prime:     8.81 cm/s  PULMONARY VEINS TAPSE (M-mode): 1.7 cm     Diastolic Velocity: 43.10 cm/s                            S/D Velocity:       1.60                            Systolic Velocity:  69.60 cm/s LEFT ATRIUM             Index        RIGHT ATRIUM           Index LA diam:        3.50 cm 1.69 cm/m   RA Area:     12.60 cm LA Vol (A2C):   43.5 ml 21.01 ml/m  RA Volume:   31.50 ml  15.21 ml/m LA Vol (A4C):   33.4 ml 16.13 ml/m LA Biplane Vol: 39.9 ml 19.27 ml/m  AORTIC VALVE AV Area (Vmax):    1.25 cm AV Area (Vmean):   1.16 cm  AV Area (VTI):     1.10 cm AV Vmax:           342.00 cm/s AV Vmean:          236.000 cm/s AV VTI:            0.814 m AV Peak Grad:      46.8 mmHg AV Mean Grad:  27.0 mmHg LVOT Vmax:         136.00 cm/s LVOT Vmean:        86.900 cm/s LVOT VTI:          0.284 m LVOT/AV VTI ratio: 0.35  AORTA Ao Asc diam: 3.40 cm MITRAL VALVE               TRICUSPID VALVE MV Area (PHT): 2.96 cm    TR Peak grad:   33.9 mmHg MV Decel Time: 256 msec    TR Vmax:        291.00 cm/s MV E velocity: 71.30 cm/s MV A velocity: 67.60 cm/s  SHUNTS MV E/A ratio:  1.05        Systemic VTI:  0.28 m                            Systemic Diam: 2.00 cm Toribio Fuel MD Electronically signed by Toribio Fuel MD Signature Date/Time: 05/14/2024/6:17:07 PM    Final (Updated)     Administration History     None          Latest Ref Rng & Units 02/04/2018    2:52 PM  PFT Results  FVC-Pre L 3.37   FVC-Predicted Pre % 71   FVC-Post L 3.88   FVC-Predicted Post % 82   Pre FEV1/FVC % % 53   Post FEV1/FCV % % 50   FEV1-Pre L 1.78   FEV1-Predicted Pre % 49   FEV1-Post L 1.96   DLCO uncorrected ml/min/mmHg 14.14   DLCO UNC% % 36   DLCO corrected ml/min/mmHg 14.75   DLCO COR %Predicted % 37   DLVA Predicted % 57   TLC L 7.36   TLC % Predicted % 91   RV % Predicted % 125     No results found for: NITRICOXIDE   Assessment & Plan:   Assessment & Plan COPD GOLD II A  - Plan for follow-up pulmonary function tests; ordered today -Continue Symbicort  2 puffs inhaled twice daily; MDI spacer given today. -Albuterol  ordered as a rescue inhaler to be used every 4-6 hours as needed for shortness of breath.  He was also instructed that he can use it about half an hour before an activity that usually causes him shortness of breath to help with stamina. Plan for follow-up after completing PFT for review. Patient may return sooner if new or worsening complaints.   Return in about 6 weeks (around 06/30/2024) for PFT.  Candis Dandy, PA-C 05/19/2024

## 2024-05-19 NOTE — Telephone Encounter (Signed)
 FYI Only or Action Required?: FYI only for provider. Scheduled for an acute appointment today at 1:30 PM  Patient is followed in Pulmonology for COPD, last seen on 03/12/2023 by Mannam, Praveen, MD.  Called Nurse Triage reporting Shortness of Breath.  Symptoms began several weeks ago.  Interventions attempted: Maintenance inhaler.  Symptoms are: unchanged.  Triage Disposition: See HCP Within 4 Hours (Or PCP Triage)  Patient/caregiver understands and will follow disposition?: Yes Copied from CRM [id number]. Topic: Clinical - Red Word Triage >> May 19, 2024  9:24 AM [name] DEL wrote: Red Word that prompted transfer to Nurse Triage: SOB on/off a few weeks. Reason for Disposition  [1] Longstanding difficulty breathing AND [2] not responding to usual therapy  Answer Assessment - Initial Assessment Questions Patient recommended to be seen today. Scheduled at [facility] location for 1:30 today.   1. RESPIRATORY STATUS: Describe your breathing? (e.g., wheezing, shortness of breath, unable to speak, severe coughing)      Shortness of breath 2. ONSET: When did this breathing problem begin?      Chronic shortness of breath that has increased. 3. PATTERN Does the difficult breathing come and go, or has it been constant since it started?      constant 4. SEVERITY: How bad is your breathing? (e.g., mild, moderate, severe)      moderate 5. RECURRENT SYMPTOM: Have you had difficulty breathing before? If Yes, ask: When was the last time? and What happened that time?      yes 6. CARDIAC HISTORY: Do you have any history of heart disease? (e.g., heart attack, angina, bypass surgery, angioplasty)      yes 7. LUNG HISTORY: Do you have any history of lung disease?  (e.g., pulmonary embolus, asthma, emphysema)     yes 8. CAUSE: What do you think is causing the breathing problem?      Unsure of what could be causing the increased shortness of breath 9. OTHER SYMPTOMS: Do you have any  other symptoms? (e.g., chest pain, cough, dizziness, fever, runny nose)     Runny nose, dizziness some of the time.  10. O2 SATURATION MONITOR:  Do you use an oxygen  saturation monitor (pulse oximeter) at home? If Yes, ask: What is your reading (oxygen  level) today? What is your usual oxygen  saturation reading? (e.g., 95%)       95% after instructing patient to take deep breaths.  12. TRAVEL: Have you traveled out of the country in the last month? (e.g., travel history, exposures)       no  Protocols used: Breathing Difficulty-A-AH

## 2024-05-19 NOTE — Progress Notes (Signed)
 Appts made

## 2024-05-30 DIAGNOSIS — J449 Chronic obstructive pulmonary disease, unspecified: Secondary | ICD-10-CM | POA: Diagnosis not present

## 2024-06-02 DIAGNOSIS — Z20828 Contact with and (suspected) exposure to other viral communicable diseases: Secondary | ICD-10-CM | POA: Diagnosis not present

## 2024-06-08 ENCOUNTER — Encounter: Payer: Self-pay | Admitting: Gastroenterology

## 2024-06-08 ENCOUNTER — Ambulatory Visit: Admitting: Gastroenterology

## 2024-06-08 VITALS — BP 90/62 | HR 91 | Ht 76.0 in | Wt 164.5 lb

## 2024-06-08 DIAGNOSIS — Z860100 Personal history of colon polyps, unspecified: Secondary | ICD-10-CM | POA: Diagnosis not present

## 2024-06-08 DIAGNOSIS — J432 Centrilobular emphysema: Secondary | ICD-10-CM

## 2024-06-08 DIAGNOSIS — Z7984 Long term (current) use of oral hypoglycemic drugs: Secondary | ICD-10-CM

## 2024-06-08 DIAGNOSIS — R197 Diarrhea, unspecified: Secondary | ICD-10-CM

## 2024-06-08 DIAGNOSIS — T82857A Stenosis of cardiac prosthetic devices, implants and grafts, initial encounter: Secondary | ICD-10-CM

## 2024-06-08 DIAGNOSIS — I480 Paroxysmal atrial fibrillation: Secondary | ICD-10-CM

## 2024-06-08 NOTE — Patient Instructions (Signed)
 You will be due for a recall colonoscopy in March 2026. We will send you a reminder in the mail when it gets closer to that time. _______________________________________________________  If your blood pressure at your visit was 140/90 or greater, please contact your primary care physician to follow up on this.  _______________________________________________________  If you are age 73 or older, your body mass index should be between 23-30. Your Body mass index is 20.02 kg/m. If this is out of the aforementioned range listed, please consider follow up with your Primary Care Provider.  If you are age 11 or younger, your body mass index should be between 19-25. Your Body mass index is 20.02 kg/m. If this is out of the aformentioned range listed, please consider follow up with your Primary Care Provider.   ________________________________________________________  The [facility] providers would like to encourage you to use MYCHART to communicate with providers for non-urgent requests or questions.  Due to long hold times on the telephone, sending your provider a message by [facility] may be a faster and more efficient way to get a response.  Please allow 48 business hours for a response.  Please remember that this is for non-urgent requests.  _______________________________________________________  [facility] is using a team-based approach to care.  Your team is made up of your doctor and two to three APPS. Our APPS (Nurse Practitioners and Physician Assistants) work with your physician to ensure care continuity for you. They are fully qualified to address your health concerns and develop a treatment plan. They communicate directly with your gastroenterologist to care for you. Seeing the Advanced Practice Practitioners on your physician's team can help you by facilitating care more promptly, often allowing for earlier appointments, access to diagnostic testing, procedures, and other specialty  referrals.    Thank you for trusting me with your gastrointestinal care!    Dr. Victory Legrand DOUGLAS [facility]

## 2024-06-08 NOTE — Progress Notes (Signed)
 [facility] Consult Note:  History: [name] 06/08/2024  Referring provider: Amon Aloysius BRAVO, MD  Reason for consult/chief complaint: Colon Cancer Screening (Patient would like to discuss scheduling a colonoscopy with provider. Patient has history of colon polyps. Patient takes Eliquis .) and Diarrhea (Patient stated for the past 3 weeks he has been experiencing diarrhea as a result of a new medication he started. His bowel movements were normal prior to this)   Subjective  Prior history: 10 mm rectal tubular adenoma March 2018 15 mm multilobulated sessile cecal tubular adenoma removed piecemeal July 2021    Discussed the use of AI scribe software for clinical note transcription with the patient, who gave verbal consent to proceed.  History of Present Illness  05/19/2024 acute issue pulmonary clinic note for exacerbation of his COPD treated with increased albuterol  frequency and PFTs obtained (not yet done).  Listed as COPD Gold 2A ______________  [name] was here today to discuss colon polyp surveillance and potentially scheduling a colonoscopy.  He also incidentally notes that his usual once daily bowel movement has become semiformed to loose in the last few weeks.  No abdominal pain or bleeding.  He thinks this may be due to the new medication, but cited his Symbicort  inhaler as the potential culprit.  He has been on metformin  for years and may have had a dose reduction in the last few months.  He also saw endocrinology recently regarding his diabetes.  He has had worsening exertional dyspnea in about the last 6 months with both cardiology and pulmonary evaluations as described in detail below.  He would back to pulmonary recently and had a change in inhaler therapy and has a PFT scheduled soon.  Cardiology offered him a left and right heart catheterization with the question of whether his exertional dyspnea could be an anginal equivalent, though their notes  suggest that they are more suspicious of the COPD is the culprit.  His prosthetic valve aortic stenosis is also been stable on subsequent echocardiograms. ROS:  Review of Systems  Constitutional:  Positive for fatigue. Negative for appetite change and unexpected weight change.  HENT:  Negative for mouth sores and voice change.   Eyes:  Negative for pain and redness.  Respiratory:  Positive for shortness of breath. Negative for cough.   Cardiovascular:  Negative for chest pain and palpitations.  Genitourinary:  Negative for dysuria and hematuria.  Musculoskeletal:  Negative for arthralgias and myalgias.  Skin:  Negative for pallor and rash.  Neurological:  Negative for weakness and headaches.  Hematological:  Negative for adenopathy.   He does not get resting or exertional chest pain and has little or no cough or sputum production.  Past Medical History: Past Medical History:  Diagnosis Date   Abnormal myocardial perfusion study 2006   EF 44% ? inferoseptal ischemia. no cath done   Allergy    Benign neoplasm of cecum    Breast mass, right 03/2008   Cardiomyopathy (HCC) 07/20/2018   Ejection fraction 4045% in September 2019   Centrilobular emphysema (HCC) 08/25/2018   02/04/2018-high-res CT- no evidence of interstitial lung disease, severe centrilobular emphysema and mild diffuse bronchial wall thickening suggesting COPD, no calcified pleural plaques no pleural effusion, mild smooth bilateral pleural thickening which is nonspecific  02/04/2018-pulmonary function test- FVC 3.37 (71% predicted), postbronchodilator ratio 50, postbronchodilator FEV1 54, mid flow reve   CHF (congestive heart failure) (HCC)    COLONIC POLYPS, HX OF 03/18/2007   Qualifier: Diagnosis of  By: [name]    Contact dermatitis 01/31/2013   COPD (chronic obstructive pulmonary disease) (HCC)    dx'd 12/2017   COPD GOLD II A  08/25/2018   02/04/2018-high-res CT- no evidence of interstitial lung disease,  severe centrilobular emphysema and mild diffuse bronchial wall thickening suggesting COPD, no calcified pleural plaques no pleural effusion, mild smooth bilateral pleural thickening which is nonspecific  02/04/2018-pulmonary function test- FVC 3.37 (71% predicted), postbronchodilator ratio 50, postbronchodilator FEV1 54, mid flow reve   Coronary artery disease    Critical aortic valve stenosis 01/12/2018   Calculated valve area less than 0.5 cm mean gradient more than 60 mm of mercury   DEGENERATIVE JOINT DISEASE, BACK 03/18/2007   Qualifier: Diagnosis of  By: [name]    Depressed left ventricular ejection fraction 03/28/2020   Diabetes mellitus (HCC) 07/25/2020   Dyslipidemia 04/20/2020   Dyspnea on exertion 01/12/2018   Emphysema of lung (HCC)    ERECTILE DYSFUNCTION, ORGANIC 02/10/2009   Qualifier: Diagnosis of  By: [name]    Former cigarette smoker 10/12/2018   Quit in 2012 41-pack-year smoking history   Goals of care, counseling/discussion 08/24/2020   H/O aortic valve replacement 05/05/2020   Heart murmur    HEMORRHOIDS, INTERNAL 03/18/2007   Qualifier: Diagnosis of  By: [name]    HTN (hypertension) 10/21/2007        Hyperlipemia 10/21/2007   Qualifier: Diagnosis of  By: [name]    Hypertension    Hypoglycemia due to insulin  03/26/2020   Low back pain    MYOCARDIAL PERFUSION SCAN, WITH STRESS TEST, ABNORMAL 08/11/2008   Qualifier: Diagnosis of  By: Delford, MD, [name]    Nocturnal hypoxemia 08/25/2018   04/09/18 >>> ONO - telephone note>>> Patient's ONO on room air showed that he was less than 88% for more than an hour. Patient needs to be on 2 L at HS.  10/06/2018-overnight oximetry-entire duration was 4 hours and 23 minutes, SPO2 less than 88% for 25 minutes and 32 seconds     Oxygen  deficiency    uses 2L at night   Paroxysmal atrial fibrillation (HCC) 10/12/2018   PCP NOTES >>>>>>>>>>>>>>>>> 05/14/2020   Polycythemia vera  (HCC) 08/04/2020   S/P AVR 03/04/2018   AVR 23 mm Edwards Magna Valve - bioprostetic 03/04/2018 LIMA - LAD   Status post coronary artery bypass graft 03/20/2018   LIMA to LAD during aortic valve replacement surgery 03/04/2018   Type 2 diabetes mellitus with hyperglycemia, with long-term current use of insulin  (HCC) 04/20/2020   Type 2 diabetes mellitus, uncontrolled, with neuropathy 05/20/2008   Qualifier: Diagnosis of  By: [name]    At 04/21/2024 cardiology office visit with Dr. Wonda: [name] is a 73 y.o. male returns for follow-up of prosthetic aortic stenosis. Comorbid conditions include CAD/aortic stenosis s/p CABG x1V/AVR 2019, COPD, HTN, HLD, DMT2, and polycythemia vera. The patient underwent CABG/AVR in 2019 when he was treated with a 23 mm [facility] Ease bovine pericardial valve and single-vessel CABG with a LIMA to LAD graft. Echo 01/26/24 showed EF 55%, moderate stenosis of the aortic prosthesis with mean gradient measures 27.0 mmHg, Vmax measures 3.50 m/s. Aortic valve acceleration time measures 98 msec, AVA 0.91, DVI 27, SVI 31, similar to previous study. Cardiac CTA showed evidence of HALT suggesting subacute leaflet thrombosis and the patient was started on oral anticoagulation. He returns today for follow-up  evaluation in the setting of progressive dyspnea.    Patient read there is concern about worsening shortness of breath when using spironolactone  and Entresto  together.  He held these medicines for a few days and then started back on Entresto  alone.  He has stopped spironolactone .  He does not appreciate a big change in how he feels and he complains of chronic shortness of breath with activity.  He denies orthopnea, PND, or leg edema.  He denies lightheadedness, chest pain, or chest pressure.  He does not appreciate a big change in his symptoms since starting apixaban  2 months ago.  He is short of breath with any moderate level activity.  He became very short of  breath with taking his trash down to the curb last week.  He reports shortness of breath with walking up stairs or walking up a hill, but he can walk on level ground at a normal pace without too much trouble.  Dr. Wonda noted that the June 2025 echocardiogram findings related to the aortic valve and LVEF had not changed significantly from the November 2024 echo.  Patient was on oral anticoagulation at the time of that visit and repeat echocardiogram was done 05/14/2024.  Result note indicates there was no change in findings after the few months of anticoagulation and that his COPD was felt to be a more significant contributing factor to the dyspnea.  However, left and right heart catheter recommended to rule out significant coronary artery stenosis that could be causing dyspnea as anginal equivalent.  Patient was going to give it further consideration.  Past Surgical History: Past Surgical History:  Procedure Laterality Date   ABDOMINAL AORTOGRAM N/A 01/13/2018   Procedure: ABDOMINAL AORTOGRAM;  Surgeon: Burnard Debby LABOR, MD;  Location: [facility] INVASIVE CV LAB;  Service: Cardiovascular;  Laterality: N/A;   AORTIC VALVE REPLACEMENT N/A 03/04/2018   Procedure: AORTIC VALVE REPLACEMENT (AVR) 23mm Edwards Magna Ease Tissue Valve.;  Surgeon: Kerrin Elspeth BROCKS, MD;  Location: [facility] OR;  Service: Open Heart Surgery;  Laterality: N/A;   CARDIAC VALVE REPLACEMENT     COLONOSCOPY W/ POLYPECTOMY     COLONOSCOPY WITH PROPOFOL  N/A 02/22/2020   Procedure: COLONOSCOPY WITH PROPOFOL ;  Surgeon: Legrand Victory LITTIE DOUGLAS, MD;  Location: [facility] ENDOSCOPY;  Service: Gastroenterology;  Laterality: N/A;   CORONARY ARTERY BYPASS GRAFT N/A 03/04/2018   Procedure: CORONARY ARTERY BYPASS GRAFTING (CABG) x1:  LIMA to LAD.;  Surgeon: Kerrin Elspeth BROCKS, MD;  Location: [facility] OR;  Service: Open Heart Surgery;  Laterality: N/A;   CORONARY PRESSURE/FFR STUDY N/A 05/10/2020   Procedure: INTRAVASCULAR PRESSURE WIRE/FFR STUDY;  Surgeon: Wonda Sharper, MD;  Location: [facility] INVASIVE CV LAB;  Service: Cardiovascular;  Laterality: N/A;   HEMOSTASIS CLIP PLACEMENT  02/22/2020   Procedure: HEMOSTASIS CLIP PLACEMENT;  Surgeon: Legrand Victory LITTIE DOUGLAS, MD;  Location: [facility]OSCOPY;  Service: Gastroenterology;;   LEFT HEART CATH AND CORS/GRAFTS ANGIOGRAPHY N/A 05/10/2020   Procedure: LEFT HEART CATH AND CORS/GRAFTS ANGIOGRAPHY;  Surgeon: Wonda Sharper, MD;  Location: [facility] INVASIVE CV LAB;  Service: Cardiovascular;  Laterality: N/A;   LIPOMA EXCISION Left 07/2008    back of shoulder Dr Nalani   POLYPECTOMY  02/22/2020   Procedure: POLYPECTOMY;  Surgeon: Legrand Victory LITTIE DOUGLAS, MD;  Location: [facility] ENDOSCOPY;  Service: Gastroenterology;;   RIGHT/LEFT HEART CATH AND CORONARY ANGIOGRAPHY N/A 01/13/2018   Procedure: RIGHT/LEFT HEART CATH AND CORONARY ANGIOGRAPHY;  Surgeon: Burnard Debby LABOR, MD;  Location: [facility] INVASIVE CV LAB;  Service: Cardiovascular;  Laterality: N/A;  TEE WITHOUT CARDIOVERSION N/A 03/04/2018   Procedure: TRANSESOPHAGEAL ECHOCARDIOGRAM (TEE);  Surgeon: Kerrin Elspeth BROCKS, MD;  Location: [facility] OR;  Service: Open Heart Surgery;  Laterality: N/A;   TONSILLECTOMY     WISDOM TOOTH EXTRACTION       Family History: Family History  Problem Relation Age of Onset   Melanoma Father 35       deceased secondary to melanoma   Alzheimer's disease Father    Diabetes Father    Hypertension Mother        alive -56   Colon cancer Neg Hx    Esophageal cancer Neg Hx    Colon polyps Neg Hx    Stomach cancer Neg Hx     Social History: Social History   Socioeconomic History   Marital status: Married    Spouse name: Not on file   Number of children: 3   Years of education: Not on file   Highest education level: Some college, no degree  Occupational History   Not on file  Tobacco Use   Smoking status: Former    Current packs/day: 0.00    Average packs/day: 1.5 packs/day for 27.0 years (40.5 ttl pk-yrs)    Types: Cigarettes    Start date: 58     Quit date: 2000    Years since quitting: 25.8   Smokeless tobacco: Never   Tobacco comments:    Smoked heavy from 76 until 2000, does not qualify for screening  Vaping Use   Vaping status: Never Used  Substance and Sexual Activity   Alcohol use: Yes    Alcohol/week: 2.0 standard drinks of alcohol    Types: 2 Glasses of wine per week    Comment: occasionally   Drug use: Not Currently   Sexual activity: Not Currently  Other Topics Concern   Not on file  Social History Narrative   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   Married    Former Smoker -  33 pack year history   Alcohol use-no     Drug use-no             Social Drivers of Corporate investment banker Strain: Low Risk  (04/14/2024)   Overall Financial Resource Strain (CARDIA)    Difficulty of Paying Living Expenses: Not very hard  Food Insecurity: No Food Insecurity (04/14/2024)   Hunger Vital Sign    Worried About Running Out of Food in the Last Year: Never true    Ran Out of Food in the Last Year: Never true  Transportation Needs: No Transportation Needs (04/14/2024)   PRAPARE - Administrator, Civil Service (Medical): No    Lack of Transportation (Non-Medical): No  Physical Activity: Insufficiently Active (04/14/2024)   Exercise Vital Sign    Days of Exercise per Week: 3 days    Minutes of Exercise per Session: 30 min  Stress: No Stress Concern Present (04/14/2024)   [name] [employer] Occupational Health - Occupational Stress Questionnaire    Feeling of Stress: Only a little  Social Connections: Socially Integrated (04/14/2024)   Social Connection and Isolation Panel    Frequency of Communication with Friends and Family: More than three times a week    Frequency of Social Gatherings with Friends and Family: Once a week    Attends Religious Services: 1 to 4 times per year    Active Member of [employer] or Organizations: Yes    Attends Banker Meetings: 1 to 4 times per year  Marital Status:  Married    Allergies: Allergies  Allergen Reactions   Farxiga  [Dapagliflozin ] Rash and Other (See Comments)    swollen penis   Lisinopril  Other (See Comments)    Hyperkalemia    Amlodipine Besylate Other (See Comments)    REACTION: ? caused left axillary nodules/chest flutter   Hydrochlorothiazide Hives    Outpatient Meds: Current Outpatient Medications  Medication Sig Dispense Refill   acetaminophen  (TYLENOL ) 500 MG tablet Take 1,000 mg by mouth every 6 (six) hours as needed for moderate pain or headache.     albuterol  (VENTOLIN  HFA) 108 (90 Base) MCG/ACT inhaler Inhale 2 puffs into the lungs every 6 (six) hours as needed for wheezing or shortness of breath. 17 each 6   amoxicillin  (AMOXIL ) 500 MG capsule Take 4 tablets by mouth 1 hour before dental procedure or cleaning. 12 capsule 3   apixaban  (ELIQUIS ) 5 MG TABS tablet Take 1 tablet (5 mg total) by mouth 2 (two) times daily. 60 tablet 5   budesonide -formoterol  (SYMBICORT ) 160-4.5 MCG/ACT inhaler Inhale 2 puffs into the lungs in the morning and at bedtime. 10.2 g 5   cetirizine  (ZYRTEC ) 10 MG tablet Take 10 mg by mouth daily as needed for allergies.     Cyanocobalamin  (VITAMIN B-12 PO) Take 1 tablet by mouth daily.     glucose blood (PRODIGY NO CODING BLOOD GLUC) test strip 1 each by Other route 2 (two) times daily. Use as instructed to check blood sugar twice a day.  DX  E11.40 200 each 3   hydrALAZINE  (APRESOLINE ) 50 MG tablet TAKE 1 TABLET BY MOUTH THREE TIMES A DAY 270 tablet 3   insulin  aspart (NOVOLOG  FLEXPEN) 100 UNIT/ML FlexPen Max daily 30 units 30 mL 4   insulin  glargine (LANTUS  SOLOSTAR) 100 UNIT/ML Solostar Pen Inject 18 Units into the skin daily. 15 mL 6   Insulin  Pen Needle (BD PEN NEEDLE NANO 2ND GEN) 32G X 4 MM MISC Inject 1 Device into the skin in the morning, at noon, in the evening, and at bedtime. 400 each 3   isosorbide  mononitrate (IMDUR ) 30 MG 24 hr tablet TAKE 1 TABLET BY MOUTH EVERY DAY 90 tablet 3    metFORMIN  (GLUCOPHAGE ) 1000 MG tablet Take 1 tablet (1,000 mg total) by mouth 2 (two) times daily with a meal. 180 tablet 3   metoprolol  succinate (TOPROL -XL) 50 MG 24 hr tablet TAKE 1 TABLET BY MOUTH EVERY DAY 90 tablet 3   OXYGEN  Inhale 2 L/min into the lungs continuous. Use at night     rosuvastatin  (CRESTOR ) 20 MG tablet Take 1 tablet (20 mg total) by mouth daily. 90 tablet 1   sacubitril -valsartan  (ENTRESTO ) 97-103 MG Take 1 tablet by mouth 2 (two) times daily. 180 tablet 2   No current facility-administered medications for this visit.      ___________________________________________________________________ Objective   Exam:  BP 90/62 (BP Location: Left Arm, Patient Position: Sitting, Cuff Size: Normal)   Pulse 91   Ht 6' 4 (1.93 m)   Wt 164 lb 8 oz (74.6 kg)   BMI 20.02 kg/m  Wt Readings from Last 3 Encounters:  06/08/24 164 lb 8 oz (74.6 kg)  05/19/24 165 lb (74.8 kg)  05/17/24 166 lb (75.3 kg)    General: Well-appearing.  Breathing comfortably at rest and on room air, speaking full sentences, ambulatory and gets on exam table easily Eyes: sclera anicteric, no redness ENT: oral mucosa moist without lesions, no cervical or supraclavicular lymphadenopathy CV: Regular with  a soft systolic murmur, no JVD, no peripheral edema Resp: Globally decreased breath sounds bilaterally, normal RR and effort noted GI: soft, no focal tenderness, with active bowel sounds. No guarding or palpable organomegaly noted. Skin; warm and dry, no rash or jaundice noted Neuro: awake, alert and oriented x 3. Normal gross motor function and fluent speech   Data:  Echocardiogram reports from September and June of this year reviewed and on file.    Encounter Diagnoses  Name Primary?   Hx of colonic polyps Yes   Paroxysmal atrial fibrillation (HCC)    Stenosis of prosthetic aortic valve, initial encounter    Centrilobular emphysema (HCC)    Acute diarrhea     Assessment & Plan  Recent  onset diarrhea of unclear cause.  BM once daily but it has turned semiformed to loose and nonbloody.  No recent antibiotic use, has had some medicine changes, but his inhaler would not be expected to cause this.  Has been on metformin  for quite a while and if anything may have had a dose reduction recently.  I would monitor this and see if it persists, especially with lack of any other GI symptoms.  He is overdue for surveillance colonoscopy with history of polyps, but my opinion is we should hold off on that for now.  He is still undergoing pulmonary testing and some adjustments in his therapy, and it is unclear to me whether or not he will need heart catheterization or be agreeable to that.  His next cardiology follow-up is not until several months from now.  I explained all this in detail to Nahiem and he understood and was agreeable.  We have placed a chart review recall for next March, at which time I will see what is happening with his condition and make a decision about office visit and/or procedure scheduling.  Contact us  sooner if needed, particularly if he has persistence or worsening of his diarrhea.  If so, stool to rule out C. difficile and a fecal elastase would be reasonable.   Thank you for the courtesy of this consult.  Please call me with any questions or concerns.  Victory LITTIE Brand III  CC: Referring provider noted above

## 2024-06-14 ENCOUNTER — Other Ambulatory Visit (HOSPITAL_COMMUNITY): Payer: Self-pay

## 2024-06-14 ENCOUNTER — Other Ambulatory Visit: Payer: Self-pay

## 2024-06-14 MED ORDER — APIXABAN 5 MG PO TABS: 5.0000 mg | ORAL_TABLET | Freq: Two times a day (BID) | ORAL | 3 refills | Status: AC

## 2024-06-14 NOTE — Progress Notes (Signed)
 Requested 90 day script by patient, okay per Izetta Hummer, PA-C.

## 2024-06-30 ENCOUNTER — Encounter

## 2024-06-30 ENCOUNTER — Ambulatory Visit: Admitting: Pulmonary Disease

## 2024-06-30 DIAGNOSIS — J449 Chronic obstructive pulmonary disease, unspecified: Secondary | ICD-10-CM | POA: Diagnosis not present

## 2024-07-06 ENCOUNTER — Other Ambulatory Visit (HOSPITAL_BASED_OUTPATIENT_CLINIC_OR_DEPARTMENT_OTHER): Payer: Self-pay

## 2024-07-06 ENCOUNTER — Ambulatory Visit (INDEPENDENT_AMBULATORY_CARE_PROVIDER_SITE_OTHER)

## 2024-07-06 DIAGNOSIS — R0602 Shortness of breath: Secondary | ICD-10-CM | POA: Diagnosis not present

## 2024-07-06 LAB — PULMONARY FUNCTION TEST
DL/VA % pred: 61 %
DL/VA: 2.4 ml/min/mmHg/L
DLCO unc % pred: 35 %
DLCO unc: 10.67 ml/min/mmHg
FEF 25-75 Post: 0.7 L/s
FEF 25-75 Pre: 0.54 L/s
FEF2575-%Change-Post: 29 %
FEF2575-%Pred-Post: 24 %
FEF2575-%Pred-Pre: 18 %
FEV1-%Change-Post: 9 %
FEV1-%Pred-Post: 39 %
FEV1-%Pred-Pre: 36 %
FEV1-Post: 1.53 L
FEV1-Pre: 1.39 L
FEV1FVC-%Change-Post: -2 %
FEV1FVC-%Pred-Pre: 63 %
FEV6-%Change-Post: 9 %
FEV6-%Pred-Post: 64 %
FEV6-%Pred-Pre: 58 %
FEV6-Post: 3.2 L
FEV6-Pre: 2.91 L
FEV6FVC-%Change-Post: -2 %
FEV6FVC-%Pred-Post: 100 %
FEV6FVC-%Pred-Pre: 102 %
FVC-%Change-Post: 12 %
FVC-%Pred-Post: 64 %
FVC-%Pred-Pre: 57 %
FVC-Post: 3.38 L
FVC-Pre: 3.01 L
Post FEV1/FVC ratio: 45 %
Post FEV6/FVC ratio: 95 %
Pre FEV1/FVC ratio: 46 %
Pre FEV6/FVC Ratio: 97 %
RV % pred: 160 %
RV: 4.43 L
TLC % pred: 90 %
TLC: 7.27 L

## 2024-07-06 NOTE — Patient Instructions (Signed)
 Full PFT performed today.

## 2024-07-06 NOTE — Progress Notes (Signed)
 Full PFT performed today.

## 2024-07-07 ENCOUNTER — Encounter (HOSPITAL_BASED_OUTPATIENT_CLINIC_OR_DEPARTMENT_OTHER): Payer: Self-pay

## 2024-07-07 ENCOUNTER — Ambulatory Visit (HOSPITAL_BASED_OUTPATIENT_CLINIC_OR_DEPARTMENT_OTHER)

## 2024-07-07 VITALS — BP 130/66 | HR 83 | Ht 76.0 in | Wt 167.0 lb

## 2024-07-07 DIAGNOSIS — J449 Chronic obstructive pulmonary disease, unspecified: Secondary | ICD-10-CM

## 2024-07-07 DIAGNOSIS — R0602 Shortness of breath: Secondary | ICD-10-CM | POA: Diagnosis not present

## 2024-07-07 MED ORDER — BUDESONIDE-FORMOTEROL FUMARATE 160-4.5 MCG/ACT IN AERO: 2.0000 | INHALATION_SPRAY | Freq: Two times a day (BID) | RESPIRATORY_TRACT | 5 refills | Status: AC

## 2024-07-07 MED ORDER — ALBUTEROL SULFATE HFA 108 (90 BASE) MCG/ACT IN AERS: 2.0000 | INHALATION_SPRAY | Freq: Four times a day (QID) | RESPIRATORY_TRACT | 6 refills | Status: AC | PRN

## 2024-07-07 NOTE — Patient Instructions (Addendum)
 Start taking Symbicort  2 puffs inhaled twice daily; use spacer with this medication  May use Albuterol  every 4-6 hours as needed for shortness of breath; may also use with a spacer  See attached information regarding spacer/MDI use.  Plan for follow up in 3 months; return sooner if new or worsening symptoms.

## 2024-07-07 NOTE — Progress Notes (Signed)
 @Patient  ID: [name], male    DOB: 1951/06/04, 73 y.o.   MRN: [id number]  Chief Complaint  Patient presents with   COPD    Follow up     Referring provider: Amon Aloysius BRAVO, MD  HPI: Discussed the use of AI scribe software for clinical note transcription with the patient, who gave verbal consent to proceed.  History of Present Illness [name] is a 73 year old male with COPD who presents with worsening shortness of breath. He is accompanied by his wife, [name].  He has a history of COPD and presents with worsening shortness of breath. Previously, he was using Symbicort , two puffs twice a day, but stopped after receiving Albuterol  at his last appointment.  His perception was that the Albuterol  was meant to replace the Symbicort , though it was meant as a rescue inhaler. He has tried other inhalers such as Stiolto and Breztri , but they did not alleviate his symptoms, as he continued to experience fatigue and shortness of breath.  He was not using a rescue inhaler initially but was prescribed albuterol  for use when short of breath. He has been using albuterol  and finds it helpful in relieving his symptoms. He also mentions that he has a spacer but has not been using it regularly. However, using the spacer during a demonstration felt different and improved his breathing.  His recent lung function test was compared to a test done six years ago and reviewed with him.  This demonstrates obstructive disease with some reversibility. He was not using Symbicort  at the time of the recent test. He is currently out of Symbicort  and requires a new prescription.   He is planning to go on a cruise next Thursday for eight days, visiting [address], [address], [place], and [place].  Last OV 05/19/2024: [name] is a 73 y/o male with PMH of HTN, CAD, cardiomyopathy, critical aortic valve stenosis, PAF, DM II, and COPD who presents today as an acute visit.  He was last seen  in our office on 03/12/2023.  He presents today with c/o progressive shortness of breath over the last couple of months.He reports that he has been taking Symbicort  2 puffs inhaled twice a day for the last 1 to 2 months.  He does not have a rescue inhaler presently.  After review of his last office notes it appears that he had been on Stiolto but did not tolerate this well and was transition to Breztri .  At some point he had stopped this medication and the Symbicort  is now new to his regimen.  He is also currently undergoing a cardiac workup for complaints of shortness of breath.  He denies fever, chills, productive cough, chest pain, hemoptysis.  He mostly reports that he has had limitation in his ADLs and he is unable to cut his own grass which is unusual for him.  We discussed the manner in which he takes his Symbicort  but he was unable to demonstrate today.     TEST/EVENTS :  PFT 07/06/2024:  mod/severe obstruction with some reversibility post BD on informal read; formal read pending  02/10/2024 CTA Chest:  Lungs/Pleura: Severe centrilobular emphysema. No focal airspace disease or lung consolidation. No pleural effusions. No suspicious pulmonary nodules.   LDCT 08/2023: COMPARISON:  08/22/2022 screening chest CT   FINDINGS: Cardiovascular: Normal heart size. No significant pericardial effusion/thickening. Three-vessel coronary atherosclerosis status post CABG. Atherosclerotic nonaneurysmal thoracic aorta. Normal caliber pulmonary arteries.   Mediastinum/Nodes: No significant  thyroid  nodules. Unremarkable esophagus. No pathologically enlarged axillary, mediastinal or hilar lymph nodes, noting limited sensitivity for the detection of hilar adenopathy on this noncontrast study.   Lungs/Pleura: No pneumothorax. No pleural effusion. Severe centrilobular emphysema with diffuse bronchial wall thickening. No acute consolidative airspace disease or lung masses. No significant growth of previously  visualized tiny right pulmonary nodules up to 3.7 mm in the peripheral right upper lobe on series 9/image 153. No new significant pulmonary nodules.   Upper abdomen: Stable subcentimeter hypodense posterior right liver lesion, too small to characterize, presumably benign. Simple upper renal cysts bilaterally, largest 2.1 cm on the right, for which no follow-up imaging is recommended.   Musculoskeletal: No aggressive appearing focal osseous lesions. Mild thoracic spondylosis. Intact sternotomy wires. Symmetric moderate gynecomastia, similar.   IMPRESSION: 1. Lung-RADS 2, benign appearance or behavior. Continue annual screening with low-dose chest CT without contrast in 12 months. 2. Symmetric moderate gynecomastia, similar. 3. Aortic Atherosclerosis (ICD10-I70.0) and Emphysema (ICD10-J43.9). Allergies  Allergen Reactions   Farxiga  [Dapagliflozin ] Rash and Other (See Comments)    swollen penis   Lisinopril  Other (See Comments)    Hyperkalemia    Amlodipine Besylate Other (See Comments)    REACTION: ? caused left axillary nodules/chest flutter   Hydrochlorothiazide Hives    Immunization History  Administered Date(s) Administered   Fluad Quad(high Dose 65+) 05/26/2019, 05/12/2020, 06/04/2021, 06/05/2022   Fluad Trivalent(High Dose 65+) 06/18/2023   INFLUENZA, HIGH DOSE SEASONAL PF 05/20/2017, 06/10/2018   Influenza Split 08/04/2012   Influenza Whole 05/20/2008, 07/12/2009, 05/02/2010   Influenza,inj,Quad PF,6+ Mos 05/07/2013, 05/09/2014, 06/07/2015, 05/01/2016   PFIZER(Purple Top)SARS-COV-2 Vaccination 10/08/2019, 11/03/2019, 11/16/2019, 12/14/2019, 06/29/2020   PNEUMOCOCCAL CONJUGATE-20 10/21/2023   Pneumococcal Conjugate-13 02/05/2017   Pneumococcal Polysaccharide-23 05/20/2008, 08/25/2018   Td 08/26/2008    Past Medical History:  Diagnosis Date   Abnormal myocardial perfusion study 2006   EF 44% ? inferoseptal ischemia. no cath done   Allergy    Benign neoplasm of  cecum    Breast mass, right 03/2008   Cardiomyopathy (HCC) 07/20/2018   Ejection fraction 4045% in September 2019   Centrilobular emphysema (HCC) 08/25/2018   02/04/2018-high-res CT- no evidence of interstitial lung disease, severe centrilobular emphysema and mild diffuse bronchial wall thickening suggesting COPD, no calcified pleural plaques no pleural effusion, mild smooth bilateral pleural thickening which is nonspecific  02/04/2018-pulmonary function test- FVC 3.37 (71% predicted), postbronchodilator ratio 50, postbronchodilator FEV1 54, mid flow reve   CHF (congestive heart failure) (HCC)    COLONIC POLYPS, HX OF 03/18/2007   Qualifier: Diagnosis of  By: [name]    Contact dermatitis 01/31/2013   COPD (chronic obstructive pulmonary disease) (HCC)    dx'd 12/2017   COPD GOLD II A  08/25/2018   02/04/2018-high-res CT- no evidence of interstitial lung disease, severe centrilobular emphysema and mild diffuse bronchial wall thickening suggesting COPD, no calcified pleural plaques no pleural effusion, mild smooth bilateral pleural thickening which is nonspecific  02/04/2018-pulmonary function test- FVC 3.37 (71% predicted), postbronchodilator ratio 50, postbronchodilator FEV1 54, mid flow reve   Coronary artery disease    Critical aortic valve stenosis 01/12/2018   Calculated valve area less than 0.5 cm mean gradient more than 60 mm of mercury   DEGENERATIVE JOINT DISEASE, BACK 03/18/2007   Qualifier: Diagnosis of  By: [name]    Depressed left ventricular ejection fraction 03/28/2020   Diabetes mellitus (HCC) 07/25/2020   Dyslipidemia 04/20/2020   Dyspnea on exertion 01/12/2018  Emphysema of lung (HCC)    ERECTILE DYSFUNCTION, ORGANIC 02/10/2009   Qualifier: Diagnosis of  By: [name]    Former cigarette smoker 10/12/2018   Quit in 2012 41-pack-year smoking history   Goals of care, counseling/discussion 08/24/2020   H/O aortic valve replacement 05/05/2020    Heart murmur    HEMORRHOIDS, INTERNAL 03/18/2007   Qualifier: Diagnosis of  By: [name]    HTN (hypertension) 10/21/2007        Hyperlipemia 10/21/2007   Qualifier: Diagnosis of  By: [name]    Hypertension    Hypoglycemia due to insulin  03/26/2020   Low back pain    MYOCARDIAL PERFUSION SCAN, WITH STRESS TEST, ABNORMAL 08/11/2008   Qualifier: Diagnosis of  By: Delford, MD, CODY Maude Dunnings    Nocturnal hypoxemia 08/25/2018   04/09/18 >>> ONO - telephone note>>> Patient's ONO on room air showed that he was less than 88% for more than an hour. Patient needs to be on 2 L at HS.  10/06/2018-overnight oximetry-entire duration was 4 hours and 23 minutes, SPO2 less than 88% for 25 minutes and 32 seconds     Oxygen  deficiency    uses 2L at night   Paroxysmal atrial fibrillation (HCC) 10/12/2018   PCP NOTES >>>>>>>>>>>>>>>>> 05/14/2020   Polycythemia vera (HCC) 08/04/2020   S/P AVR 03/04/2018   AVR 23 mm Edwards Magna Valve - bioprostetic 03/04/2018 LIMA - LAD   Status post coronary artery bypass graft 03/20/2018   LIMA to LAD during aortic valve replacement surgery 03/04/2018   Type 2 diabetes mellitus with hyperglycemia, with long-term current use of insulin  (HCC) 04/20/2020   Type 2 diabetes mellitus, uncontrolled, with neuropathy 05/20/2008   Qualifier: Diagnosis of  By: [name]     Tobacco History: Social History   Tobacco Use  Smoking Status Former   Current packs/day: 0.00   Average packs/day: 1.5 packs/day for 27.0 years (40.5 ttl pk-yrs)   Types: Cigarettes   Start date: 3   Quit date: 2000   Years since quitting: 25.8  Smokeless Tobacco Never  Tobacco Comments   Smoked heavy from 1973 until 2000, does not qualify for screening   Counseling given: Not Answered Tobacco comments: Smoked heavy from 1973 until 2000, does not qualify for screening   Outpatient Medications Prior to Visit  Medication Sig Dispense Refill   acetaminophen   (TYLENOL ) 500 MG tablet Take 1,000 mg by mouth every 6 (six) hours as needed for moderate pain or headache.     amoxicillin  (AMOXIL ) 500 MG capsule Take 4 tablets by mouth 1 hour before dental procedure or cleaning. 12 capsule 3   apixaban  (ELIQUIS ) 5 MG TABS tablet Take 1 tablet (5 mg total) by mouth 2 (two) times daily. 180 tablet 3   cetirizine  (ZYRTEC ) 10 MG tablet Take 10 mg by mouth daily as needed for allergies.     Cyanocobalamin  (VITAMIN B-12 PO) Take 1 tablet by mouth daily.     glucose blood (PRODIGY NO CODING BLOOD GLUC) test strip 1 each by Other route 2 (two) times daily. Use as instructed to check blood sugar twice a day.  DX  E11.40 200 each 3   hydrALAZINE  (APRESOLINE ) 50 MG tablet TAKE 1 TABLET BY MOUTH THREE TIMES A DAY 270 tablet 3   insulin  aspart (NOVOLOG  FLEXPEN) 100 UNIT/ML FlexPen Max daily 30 units 30 mL 4   insulin  glargine (LANTUS  SOLOSTAR) 100 UNIT/ML Solostar Pen Inject 18 Units  into the skin daily. 15 mL 6   Insulin  Pen Needle (BD PEN NEEDLE NANO 2ND GEN) 32G X 4 MM MISC Inject 1 Device into the skin in the morning, at noon, in the evening, and at bedtime. 400 each 3   isosorbide  mononitrate (IMDUR ) 30 MG 24 hr tablet TAKE 1 TABLET BY MOUTH EVERY DAY 90 tablet 3   metFORMIN  (GLUCOPHAGE ) 1000 MG tablet Take 1 tablet (1,000 mg total) by mouth 2 (two) times daily with a meal. 180 tablet 3   metoprolol  succinate (TOPROL -XL) 50 MG 24 hr tablet TAKE 1 TABLET BY MOUTH EVERY DAY 90 tablet 3   OXYGEN  Inhale 2 L/min into the lungs continuous. Use at night     rosuvastatin  (CRESTOR ) 20 MG tablet Take 1 tablet (20 mg total) by mouth daily. 90 tablet 1   sacubitril -valsartan  (ENTRESTO ) 97-103 MG Take 1 tablet by mouth 2 (two) times daily. 180 tablet 2   albuterol  (VENTOLIN  HFA) 108 (90 Base) MCG/ACT inhaler Inhale 2 puffs into the lungs every 6 (six) hours as needed for wheezing or shortness of breath. 17 each 6   budesonide -formoterol  (SYMBICORT ) 160-4.5 MCG/ACT inhaler Inhale 2  puffs into the lungs in the morning and at bedtime. 10.2 g 5   No facility-administered medications prior to visit.     Review of Systems: as per HPI  Constitutional:   No  weight loss, night sweats,  Fevers, chills, fatigue, or  lassitude.  HEENT:   No headaches,  Difficulty swallowing,  Tooth/dental problems, or  Sore throat,                No sneezing, itching, ear ache, nasal congestion, post nasal drip,   CV:  No chest pain,  Orthopnea, PND, swelling in lower extremities, anasarca, dizziness, palpitations, syncope.   GI  No heartburn, indigestion, abdominal pain, nausea, vomiting, diarrhea, change in bowel habits, loss of appetite, bloody stools.   Resp: No shortness of breath with exertion or at rest.  No excess mucus, no productive cough,  No non-productive cough,  No coughing up of blood.  No change in color of mucus.  No wheezing.  No chest wall deformity  Skin: no rash or lesions.  GU: no dysuria, change in color of urine, no urgency or frequency.  No flank pain, no hematuria   MS:  No joint pain or swelling.  No decreased range of motion.  No back pain.    Physical Exam  BP 130/66   Pulse 83   Ht 6' 4 (1.93 m)   Wt 167 lb (75.8 kg)   SpO2 93%   BMI 20.33 kg/m   GEN: A/Ox3; pleasant , NAD, well nourished    HEENT:  [place]/AT,  EACs-clear, TMs-wnl, NOSE-clear, THROAT-clear, no lesions, no postnasal drip or exudate noted.   NECK:  Supple w/ fair ROM; no JVD; normal carotid impulses w/o bruits; no thyromegaly or nodules palpated; no lymphadenopathy.    RESP  Clear  P & A; w/o, wheezes/ rales/ or rhonchi. no accessory muscle use, no dullness to percussion  CARD:  RRR, no m/r/g, no peripheral edema, pulses intact, no cyanosis or clubbing.  GI:   Soft & nt; nml bowel sounds; no organomegaly or masses detected.   Musco: Warm bil, no deformities or joint swelling noted.   Neuro: alert, no focal deficits noted.    Skin: Warm, no lesions or rashes    Lab  Results:  CBC    Component Value Date/Time   WBC  8.4 04/12/2024 1409   RBC 6.19 (H) 04/12/2024 1409   HGB 10.4 (L) 04/12/2024 1409   HGB 11.1 (L) 03/31/2024 1005   HGB 12.4 (L) 01/29/2024 1351   HCT 34.9 (L) 04/12/2024 1409   HCT 46.8 01/29/2024 1351   PLT 300.0 R 04/12/2024 1409   PLT 306 03/31/2024 1005   PLT 416 01/29/2024 1351   MCV 56.5 (L) 04/12/2024 1409   MCV 64 (L) 01/29/2024 1351   MCH 16.5 (L) 03/31/2024 1005   MCHC 29.8 (L) 04/12/2024 1409   RDW 24.0 (H) 04/12/2024 1409   RDW 24.2 (H) 01/29/2024 1351   LYMPHSABS 1.4 R 04/12/2024 1409   MONOABS 0.7 R 04/12/2024 1409   EOSABS 0.1 R 04/12/2024 1409   BASOSABS 0.0 R 04/12/2024 1409    BMET    Component Value Date/Time   NA 136 03/31/2024 1005   NA 140 01/29/2024 1351   K 4.5 03/31/2024 1005   CL 102 03/31/2024 1005   CO2 23 03/31/2024 1005   GLUCOSE 239 (H) 03/31/2024 1005   BUN 15 03/31/2024 1005   BUN 13 01/29/2024 1351   CREATININE 1.06 03/31/2024 1005   CREATININE 0.84 11/30/2013 0856   CALCIUM  9.3 03/31/2024 1005   GFRNONAA >60 03/31/2024 1005   GFRNONAA >89 10/26/2013 0906   GFRAA 102 06/07/2020 1111   GFRAA >89 10/26/2013 0906    BNP    Component Value Date/Time   BNP 48.6 01/29/2024 1351   BNP 76.4 09/05/2020 1256    ProBNP No results found for: PROBNP  Imaging: No results found.  Administration History     None          Latest Ref Rng & Units 07/06/2024    3:38 PM 02/04/2018    2:52 PM  PFT Results  FVC-Pre L 3.01  P 3.37   FVC-Predicted Pre % 57  P 71   FVC-Post L 3.38  P 3.88   FVC-Predicted Post % 64  P 82   Pre FEV1/FVC % % 46  P 53   Post FEV1/FCV % % 45  P 50   FEV1-Pre L 1.39  P 1.78   FEV1-Predicted Pre % 36  P 49   FEV1-Post L 1.53  P 1.96   DLCO uncorrected ml/min/mmHg 10.67  P 14.14   DLCO UNC% % 35  P 36   DLCO corrected ml/min/mmHg  14.75   DLCO COR %Predicted %  37   DLVA Predicted % 61  P 57   TLC L 7.27  P 7.36   TLC % Predicted % 90  P 91   RV %  Predicted % 160  P 125     P Preliminary result    No results found for: NITRICOXIDE   Assessment & Plan:   Assessment & Plan COPD GOLD II A   SOB (shortness of breath)   Assessment and Plan Assessment & Plan Chronic obstructive pulmonary disease (COPD), GOLD II A COPD GOLD II A with progression since 2019. Current tests show obstructive pattern. Previous medications not tolerated. Albuterol  misunderstood as maintenance inhaler. Albuterol  effective for breakthrough symptoms. - Prescribed Symbicort  160-4.5 MCG/ACT, 2 puffs twice daily with spacer for daily maintenance therapy. - Continue albuterol  as needed every 4-6 hours for breakthrough shortness of breath. - Provided QR code for video on spacer use. - Sent prescriptions to [facility] on [address]. - Monitor response to treatment over the next few months.  Patient may require transition to nebulized medications pending improvement in  symptoms with spacer use.  Shortness of breath secondary to COPD Intermittent shortness of breath relieved by albuterol . Symbicort  to prevent exacerbations and maintain airway patency. Proper spacer use crucial. - Ensure proper use of spacer with Symbicort  and albuterol . - Monitor for improvement in shortness of breath with consistent use of Symbicort  and albuterol . -  May need to transition to nebulizers for management.   Return in about 3 months (around 10/07/2024).  Candis Dandy, PA-C 07/07/2024

## 2024-07-12 ENCOUNTER — Other Ambulatory Visit: Payer: Self-pay

## 2024-07-12 ENCOUNTER — Ambulatory Visit: Admitting: Internal Medicine

## 2024-07-12 DIAGNOSIS — T82857A Stenosis of cardiac prosthetic devices, implants and grafts, initial encounter: Secondary | ICD-10-CM

## 2024-07-14 ENCOUNTER — Encounter (HOSPITAL_BASED_OUTPATIENT_CLINIC_OR_DEPARTMENT_OTHER)

## 2024-07-19 DIAGNOSIS — J449 Chronic obstructive pulmonary disease, unspecified: Secondary | ICD-10-CM | POA: Diagnosis not present

## 2024-07-31 ENCOUNTER — Other Ambulatory Visit: Payer: Self-pay

## 2024-07-31 ENCOUNTER — Ambulatory Visit (HOSPITAL_COMMUNITY)

## 2024-07-31 ENCOUNTER — Ambulatory Visit (HOSPITAL_COMMUNITY)
Admission: EM | Admit: 2024-07-31 | Discharge: 2024-07-31 | Disposition: A | Discharge status: Home / Self Care | Attending: Emergency Medicine | Admitting: Emergency Medicine

## 2024-07-31 ENCOUNTER — Encounter (HOSPITAL_COMMUNITY): Payer: Self-pay | Admitting: *Deleted

## 2024-07-31 DIAGNOSIS — J441 Chronic obstructive pulmonary disease with (acute) exacerbation: Secondary | ICD-10-CM | POA: Diagnosis not present

## 2024-07-31 DIAGNOSIS — R0602 Shortness of breath: Secondary | ICD-10-CM | POA: Diagnosis not present

## 2024-07-31 DIAGNOSIS — I700 Atherosclerosis of aorta: Secondary | ICD-10-CM | POA: Diagnosis not present

## 2024-07-31 DIAGNOSIS — R059 Cough, unspecified: Secondary | ICD-10-CM | POA: Diagnosis not present

## 2024-07-31 MED ORDER — PREDNISONE 20 MG PO TABS: 40.0000 mg | ORAL_TABLET | Freq: Every day | ORAL | 0 refills | Status: AC

## 2024-07-31 MED ORDER — IPRATROPIUM-ALBUTEROL 0.5-2.5 (3) MG/3ML IN SOLN
RESPIRATORY_TRACT | Status: AC
  Filled 2024-07-31: qty 3

## 2024-07-31 MED ORDER — IPRATROPIUM-ALBUTEROL 0.5-2.5 (3) MG/3ML IN SOLN
3.0000 mL | Freq: Once | RESPIRATORY_TRACT | Status: AC
  Administered 2024-07-31: 3 mL via RESPIRATORY_TRACT

## 2024-07-31 MED ORDER — AMOXICILLIN-POT CLAVULANATE 875-125 MG PO TABS: 1.0000 | ORAL_TABLET | Freq: Two times a day (BID) | ORAL | 0 refills | Status: DC

## 2024-07-31 NOTE — ED Triage Notes (Signed)
 Pt reports SHOB and congestion. Pt uses nasal O2 during the day @3  liters and Nasal O2 at [facility] @ 2 liters. Pt 's SPO2 81 % on arrival to Triage . Pt did not bring Portable O2 with him. PT started on 3 liters of nasal O2 @ 3liters. SPO2 90 %

## 2024-07-31 NOTE — ED Triage Notes (Signed)
 PT on 3 liters nasal O2 @ 3 liters SPO2 98%.

## 2024-07-31 NOTE — ED Triage Notes (Signed)
 PT reports he slept with his nasal O2 last night but did not put on nasal O2 after he was a wake.  Pt off of nasal O2 to take oral temp and SPO2 dropped to 91%

## 2024-07-31 NOTE — ED Provider Notes (Signed)
 [facility]    CSN: [id number] Arrival date & time: 07/31/24  1045      History   Chief Complaint Chief Complaint  Patient presents with   Shortness of Breath    HPI [name] is a 73 y.o. male.   Patient presents to clinic today with his wife.  Has had cold-like symptoms for the past 8 days but recently he has gotten worse with shortness of breath and a productive cough.  Does have a history of COPD and uses a Symbicort  inhaler daily and albuterol  inhaler as needed.  Does not think he has had a fever, has not measured temperature at home.  Denies chest pain.  Did get back from a cruise last Saturday, symptoms started the day prior.  Has had nasal congestion and rhinorrhea.  Reports productive cough, sputum is clear.  Will wear nighttime oxygen .  Occasionally will wear daytime oxygen  as needed.  Did not bring portable oxygen  tank to clinic today.  Does not typically wear daytime oxygen .   Hx of DM2, A1C 2 months ago 6.9. Uses insulin .   The history is provided by the patient, medical records and the spouse.  Shortness of Breath   Past Medical History:  Diagnosis Date   Abnormal myocardial perfusion study 2006   EF 44% ? inferoseptal ischemia. no cath done   Allergy    Benign neoplasm of cecum    Breast mass, right 03/2008   Cardiomyopathy (HCC) 07/20/2018   Ejection fraction 4045% in September 2019   Centrilobular emphysema (HCC) 08/25/2018   02/04/2018-high-res CT- no evidence of interstitial lung disease, severe centrilobular emphysema and mild diffuse bronchial wall thickening suggesting COPD, no calcified pleural plaques no pleural effusion, mild smooth bilateral pleural thickening which is nonspecific  02/04/2018-pulmonary function test- FVC 3.37 (71% predicted), postbronchodilator ratio 50, postbronchodilator FEV1 54, mid flow reve   CHF (congestive heart failure) (HCC)    COLONIC POLYPS, HX OF 03/18/2007   Qualifier: Diagnosis of  By: [name]    Contact dermatitis 01/31/2013   COPD (chronic obstructive pulmonary disease) (HCC)    dx'd 12/2017   COPD GOLD II A  08/25/2018   02/04/2018-high-res CT- no evidence of interstitial lung disease, severe centrilobular emphysema and mild diffuse bronchial wall thickening suggesting COPD, no calcified pleural plaques no pleural effusion, mild smooth bilateral pleural thickening which is nonspecific  02/04/2018-pulmonary function test- FVC 3.37 (71% predicted), postbronchodilator ratio 50, postbronchodilator FEV1 54, mid flow reve   Coronary artery disease    Critical aortic valve stenosis 01/12/2018   Calculated valve area less than 0.5 cm mean gradient more than 60 mm of mercury   DEGENERATIVE JOINT DISEASE, BACK 03/18/2007   Qualifier: Diagnosis of  By: [name]    Depressed left ventricular ejection fraction 03/28/2020   Diabetes mellitus (HCC) 07/25/2020   Dyslipidemia 04/20/2020   Dyspnea on exertion 01/12/2018   Emphysema of lung (HCC)    ERECTILE DYSFUNCTION, ORGANIC 02/10/2009   Qualifier: Diagnosis of  By: [name]    Former cigarette smoker 10/12/2018   Quit in 2012 41-pack-year smoking history   Goals of care, counseling/discussion 08/24/2020   H/O aortic valve replacement 05/05/2020   Heart murmur    HEMORRHOIDS, INTERNAL 03/18/2007   Qualifier: Diagnosis of  By: [name]    HTN (hypertension) 10/21/2007        Hyperlipemia 10/21/2007   Qualifier: Diagnosis of  By: Georgian DO, D.  Robert    Hypertension    Hypoglycemia due to insulin  03/26/2020   Low back pain    MYOCARDIAL PERFUSION SCAN, WITH STRESS TEST, ABNORMAL 08/11/2008   Qualifier: Diagnosis of  By: Delford, MD, CODY Maude Dunnings    Nocturnal hypoxemia 08/25/2018   04/09/18 >>> ONO - telephone note>>> Patient's ONO on room air showed that he was less than 88% for more than an hour. Patient needs to be on 2 L at HS.  10/06/2018-overnight oximetry-entire duration was 4 hours and 23  minutes, SPO2 less than 88% for 25 minutes and 32 seconds     Oxygen  deficiency    uses 2L at night   Paroxysmal atrial fibrillation (HCC) 10/12/2018   PCP NOTES >>>>>>>>>>>>>>>>> 05/14/2020   Polycythemia vera (HCC) 08/04/2020   S/P AVR 03/04/2018   AVR 23 mm Edwards Magna Valve - bioprostetic 03/04/2018 LIMA - LAD   Status post coronary artery bypass graft 03/20/2018   LIMA to LAD during aortic valve replacement surgery 03/04/2018   Type 2 diabetes mellitus with hyperglycemia, with long-term current use of insulin  (HCC) 04/20/2020   Type 2 diabetes mellitus, uncontrolled, with neuropathy 05/20/2008   Qualifier: Diagnosis of  By: [name]     Patient Active Problem List   Diagnosis Date Noted   Type 2 diabetes mellitus with diabetic polyneuropathy, with long-term current use of insulin  (HCC) 05/17/2024   Diabetes mellitus (HCC) 05/17/2024   Stenosis of prosthetic aortic valve 07/28/2023   Depressed left ventricular ejection fraction 11/12/2021   Oxygen  deficiency    Heart murmur    Polycythemia vera (HCC) 08/04/2020   PCP NOTES >>>>>>>>>>>>>>>>> 05/14/2020   H/O aortic valve replacement 05/05/2020   Type 2 diabetes mellitus with hyperglycemia, with long-term current use of insulin  (HCC) 04/20/2020   Paroxysmal atrial fibrillation (HCC) 10/12/2018   Former cigarette smoker 10/12/2018   COPD with hypoxia (HCC) 08/25/2018   Centrilobular emphysema (HCC) 08/25/2018   Nocturnal hypoxemia 08/25/2018   Cardiomyopathy (HCC) 07/20/2018   Status post coronary artery bypass graft 03/20/2018   Coronary artery disease 02/12/2018   Critical aortic valve stenosis 01/12/2018   Dyspnea on exertion 01/12/2018   Annual physical exam 03/02/2015   ERECTILE DYSFUNCTION, ORGANIC 02/10/2009   MYOCARDIAL PERFUSION SCAN, WITH STRESS TEST, ABNORMAL 08/11/2008   Hyperlipemia 10/21/2007   HTN (hypertension) 10/21/2007   DEGENERATIVE JOINT DISEASE, BACK 03/18/2007   History of colonic polyps  03/18/2007    Past Surgical History:  Procedure Laterality Date   ABDOMINAL AORTOGRAM N/A 01/13/2018   Procedure: ABDOMINAL AORTOGRAM;  Surgeon: Burnard Debby LABOR, MD;  Location: [facility] INVASIVE CV LAB;  Service: Cardiovascular;  Laterality: N/A;   AORTIC VALVE REPLACEMENT N/A 03/04/2018   Procedure: AORTIC VALVE REPLACEMENT (AVR) 23mm Edwards Magna Ease Tissue Valve.;  Surgeon: Kerrin Elspeth BROCKS, MD;  Location: [facility] OR;  Service: Open Heart Surgery;  Laterality: N/A;   CARDIAC VALVE REPLACEMENT     COLONOSCOPY W/ POLYPECTOMY     COLONOSCOPY WITH PROPOFOL  N/A 02/22/2020   Procedure: COLONOSCOPY WITH PROPOFOL ;  Surgeon: Legrand Victory LITTIE DOUGLAS, MD;  Location: [facility];  Service: Gastroenterology;  Laterality: N/A;   CORONARY ARTERY BYPASS GRAFT N/A 03/04/2018   Procedure: CORONARY ARTERY BYPASS GRAFTING (CABG) x1:  LIMA to LAD.;  Surgeon: Kerrin Elspeth BROCKS, MD;  Location: [facility] OR;  Service: Open Heart Surgery;  Laterality: N/A;   CORONARY PRESSURE/FFR STUDY N/A 05/10/2020   Procedure: INTRAVASCULAR PRESSURE WIRE/FFR STUDY;  Surgeon: Wonda Sharper, MD;  Location: [facility] INVASIVE  CV LAB;  Service: Cardiovascular;  Laterality: N/A;   HEMOSTASIS CLIP PLACEMENT  02/22/2020   Procedure: HEMOSTASIS CLIP PLACEMENT;  Surgeon: Legrand Victory LITTIE DOUGLAS, MD;  Location: [facility];  Service: Gastroenterology;;   LEFT HEART CATH AND CORS/GRAFTS ANGIOGRAPHY N/A 05/10/2020   Procedure: LEFT HEART CATH AND CORS/GRAFTS ANGIOGRAPHY;  Surgeon: Wonda Sharper, MD;  Location: [facility] INVASIVE CV LAB;  Service: Cardiovascular;  Laterality: N/A;   LIPOMA EXCISION Left 07/2008    back of shoulder Dr Nalani   POLYPECTOMY  02/22/2020   Procedure: POLYPECTOMY;  Surgeon: Legrand Victory LITTIE DOUGLAS, MD;  Location: [facility];  Service: Gastroenterology;;   RIGHT/LEFT HEART CATH AND CORONARY ANGIOGRAPHY N/A 01/13/2018   Procedure: RIGHT/LEFT HEART CATH AND CORONARY ANGIOGRAPHY;  Surgeon: Burnard Debby LABOR, MD;  Location: [facility] INVASIVE CV LAB;  Service:  Cardiovascular;  Laterality: N/A;   TEE WITHOUT CARDIOVERSION N/A 03/04/2018   Procedure: TRANSESOPHAGEAL ECHOCARDIOGRAM (TEE);  Surgeon: Kerrin Elspeth BROCKS, MD;  Location: [facility] OR;  Service: Open Heart Surgery;  Laterality: N/A;   TONSILLECTOMY     WISDOM TOOTH EXTRACTION         Home Medications    Prior to Admission medications   Medication Sig Start Date End Date Taking? Authorizing Provider  acetaminophen  (TYLENOL ) 500 MG tablet Take 1,000 mg by mouth every 6 (six) hours as needed for moderate pain or headache.   Yes [provider]  albuterol  (VENTOLIN  HFA) 108 (90 Base) MCG/ACT inhaler Inhale 2 puffs into the lungs every 6 (six) hours as needed for wheezing or shortness of breath. 07/07/24  Yes Charley Conger, PA-C  amoxicillin -clavulanate (AUGMENTIN ) 875-125 MG tablet Take 1 tablet by mouth every 12 (twelve) hours. 07/31/24  Yes Willet Schleifer  N, FNP  apixaban  (ELIQUIS ) 5 MG TABS tablet Take 1 tablet (5 mg total) by mouth 2 (two) times daily. 06/14/24  Yes Sebastian Lamarr SAUNDERS, PA-C  budesonide -formoterol  (SYMBICORT ) 160-4.5 MCG/ACT inhaler Inhale 2 puffs into the lungs in the morning and at bedtime. 07/07/24  Yes Charley Conger, PA-C  Cyanocobalamin  (VITAMIN B-12 PO) Take 1 tablet by mouth daily.   Yes [provider]  glucose blood (PRODIGY NO CODING BLOOD GLUC) test strip 1 each by Other route 2 (two) times daily. Use as instructed to check blood sugar twice a day.  DX  E11.40 05/17/24  Yes Shamleffer, Ibtehal Jaralla, MD  hydrALAZINE  (APRESOLINE ) 50 MG tablet TAKE 1 TABLET BY MOUTH THREE TIMES A DAY 10/02/23  Yes Tobb, Kardie, DO  insulin  aspart (NOVOLOG  FLEXPEN) 100 UNIT/ML FlexPen Max daily 30 units 05/17/24  Yes Shamleffer, Ibtehal Jaralla, MD  insulin  glargine (LANTUS  SOLOSTAR) 100 UNIT/ML Solostar Pen Inject 18 Units into the skin daily. 05/17/24  Yes Shamleffer, Ibtehal Jaralla, MD  isosorbide  mononitrate (IMDUR ) 30 MG 24 hr tablet TAKE 1 TABLET BY MOUTH EVERY DAY  08/15/23  Yes Lambert, Jennifer K, PA-C  metFORMIN  (GLUCOPHAGE ) 1000 MG tablet Take 1 tablet (1,000 mg total) by mouth 2 (two) times daily with a meal. 05/17/24  Yes Shamleffer, Ibtehal Jaralla, MD  metoprolol  succinate (TOPROL -XL) 50 MG 24 hr tablet TAKE 1 TABLET BY MOUTH EVERY DAY 09/16/23  Yes Tobb, Kardie, DO  OXYGEN  Inhale 2 L/min into the lungs continuous. Use at night   Yes [provider]  predniSONE  (DELTASONE ) 20 MG tablet Take 2 tablets (40 mg total) by mouth daily for 5 days. 07/31/24 08/05/24 Yes Guillermina Shaft  N, FNP  rosuvastatin  (CRESTOR ) 20 MG tablet Take 1 tablet (20 mg total) by mouth daily.  03/09/24  Yes Paz, Aloysius BRAVO, MD  sacubitril -valsartan  (ENTRESTO ) 97-103 MG Take 1 tablet by mouth 2 (two) times daily. 03/09/24  Yes Tobb, Kardie, DO  amoxicillin  (AMOXIL ) 500 MG capsule Take 4 tablets by mouth 1 hour before dental procedure or cleaning. 04/16/24   Sebastian Lamarr SAUNDERS, PA-C  cetirizine  (ZYRTEC ) 10 MG tablet Take 10 mg by mouth daily as needed for allergies.    [provider]  Insulin  Pen Needle (BD PEN NEEDLE NANO 2ND GEN) 32G X 4 MM MISC Inject 1 Device into the skin in the morning, at noon, in the evening, and at bedtime. 05/17/24   Shamleffer, Donell Cardinal, MD    Family History Family History  Problem Relation Age of Onset   Melanoma Father 63       deceased secondary to melanoma   Alzheimer's disease Father    Diabetes Father    Hypertension Mother        alive -48   Colon cancer Neg Hx    Esophageal cancer Neg Hx    Colon polyps Neg Hx    Stomach cancer Neg Hx     Social History Social History   Tobacco Use   Smoking status: Former    Current packs/day: 0.00    Average packs/day: 1.5 packs/day for 27.0 years (40.5 ttl pk-yrs)    Types: Cigarettes    Start date: 30    Quit date: 2000    Years since quitting: 25.9   Smokeless tobacco: Never   Tobacco comments:    Smoked heavy from 60 until 2000, does not qualify for screening   Vaping Use   Vaping status: Never Used  Substance Use Topics   Alcohol use: Yes    Alcohol/week: 2.0 standard drinks of alcohol    Types: 2 Glasses of wine per week    Comment: occasionally   Drug use: Not Currently     Allergies   Farxiga  [dapagliflozin ], Lisinopril , Amlodipine besylate, and Hydrochlorothiazide   Review of Systems Review of Systems  Per HPI  Physical Exam Triage Vital Signs ED Triage Vitals  Encounter Vitals Group     BP --      Girls Systolic BP Percentile --      Girls Diastolic BP Percentile --      Boys Systolic BP Percentile --      Boys Diastolic BP Percentile --      Pulse Rate 07/31/24 1102 87     Resp 07/31/24 1102 20     Temp 07/31/24 1102 98 F (36.7 C)     Temp src --      SpO2 07/31/24 1102 98 %     Weight --      Height --      Head Circumference --      Peak Flow --      Pain Score 07/31/24 1058 0     Pain Loc --      Pain Education --      Exclude from Growth Chart --    No data found.  Updated Vital Signs BP 92/60   Pulse 87   Temp 98 F (36.7 C)   Resp 20   SpO2 96%   Visual Acuity Right Eye Distance:   Left Eye Distance:   Bilateral Distance:    Right Eye Near:   Left Eye Near:    Bilateral Near:     Physical Exam Vitals and nursing note reviewed.  Constitutional:      Appearance:  Normal appearance. He is well-developed.  HENT:     Head: Normocephalic and atraumatic.     Right Ear: External ear normal.     Left Ear: External ear normal.     Nose: Congestion present.     Mouth/Throat:     Mouth: Mucous membranes are moist.  Eyes:     Conjunctiva/sclera: Conjunctivae normal.  Cardiovascular:     Rate and Rhythm: Normal rate and regular rhythm.     Heart sounds: Normal heart sounds. No murmur heard. Pulmonary:     Effort: Pulmonary effort is normal.     Breath sounds: Normal breath sounds. No wheezing or rhonchi.  Musculoskeletal:        General: Normal range of motion.  Skin:    General: Skin is  warm and dry.  Neurological:     General: No focal deficit present.     Mental Status: He is alert and oriented to person, place, and time.  Psychiatric:        Mood and Affect: Mood normal.        Behavior: Behavior normal.      UC Treatments / Results  Labs (all labs ordered are listed, but only abnormal results are displayed) Labs Reviewed - No data to display  EKG   Radiology DG Chest 2 View Result Date: 07/31/2024 EXAM: 2 VIEW(S) XRAY OF THE CHEST 07/31/2024 11:38:04 AM COMPARISON: None available. CLINICAL HISTORY: cough, SOB FINDINGS: LUNGS AND PLEURA: Diffuse coarsened interstitial markings. Mild peripheral interlobular septal thickening within the mid and lower lung zones with blunting of the right costophrenic angle. No consolidative change. No pneumothorax. HEART AND MEDIASTINUM: Calcified aorta. Valve replacement noted. BONES AND SOFT TISSUES: Median sternotomy noted. IMPRESSION: 1. Chronic coarsened interstitial markings similar to previous exam. 2. Mildly increased interstitial markings and blunting of the right costophrenic angle, which may reflect changes due to early chf. Electronically signed by: Waddell Calk MD 07/31/2024 11:56 AM EST RP Workstation: [id number]    Procedures Procedures (including critical care time)  Medications Ordered in UC Medications  ipratropium-albuterol  (DUONEB) 0.5-2.5 (3) MG/3ML nebulizer solution 3 mL (3 mLs Nebulization Given 07/31/24 1208)    Initial Impression / Assessment and Plan / UC Course  I have reviewed the triage vital signs and the nursing notes.  Pertinent labs & imaging results that were available during my care of the patient were reviewed by me and considered in my medical decision making (see chart for details).  Vitals and triage reviewed, patient is hemodynamically stable.  Oxygenation 96% on 3 L nasal cannula.  Lungs vesicular, heart with regular rate and rhythm.  Chest x-ray by my interpretation does not show  obvious infiltrate, confirmed with radiology overread.  May show early signs of CHF, BNP a few months ago was within normal limits.  DuoNeb given in clinic.  Patient feeling much better afterwards and oxygenation 97% on room air.  Reports shortness of breath is improved.  Over concern for COPD exacerbation will treat with Augmentin  and prednisone .  Patient is a type 2 insulin -dependent diabetic, encouraged monitoring blood sugars and discussed insulin  may need to be temporarily increased.  Will by another pulse oximetry finger reader.  Strict emergency precautions given if shortness of breath worsens or oxygen  continues to remain low despite home supplemental oxygen .  Plan of care, follow-up care return precautions given, no questions at this time.    Final Clinical Impressions(s) / UC Diagnoses   Final diagnoses:  Shortness of breath  COPD exacerbation (HCC)  Discharge Instructions      Take the antibiotic twice daily with food until finished Start the prednisone  and then take it daily with breakfast Use your albuterol  inhaler as needed for any wheezing or shortness of breath Continue to use your Symbicort  inhaler in the morning and at bedtime Can use over-the-counter Mucinex to help loosen up secretions, 1200 mg daily in addition to drinking at least 64 ounces of water daily  Symptoms should improve over the next few days, if no improvement or any changes seek follow-up care      ED Prescriptions     Medication Sig Dispense Auth. Provider   amoxicillin -clavulanate (AUGMENTIN ) 875-125 MG tablet Take 1 tablet by mouth every 12 (twelve) hours. 14 tablet Ranell Finelli  N, FNP   predniSONE  (DELTASONE ) 20 MG tablet Take 2 tablets (40 mg total) by mouth daily for 5 days. 10 tablet Dreama Othelia SAILOR, FNP      PDMP not reviewed this encounter.   Dreama, Kelcie Currie  N, FNP 07/31/24 1229

## 2024-07-31 NOTE — Discharge Instructions (Signed)
 Take the antibiotic twice daily with food until finished Start the prednisone  and then take it daily with breakfast Use your albuterol  inhaler as needed for any wheezing or shortness of breath Continue to use your Symbicort  inhaler in the morning and at bedtime Can use over-the-counter Mucinex to help loosen up secretions, 1200 mg daily in addition to drinking at least 64 ounces of water daily  Symptoms should improve over the next few days, if no improvement or any changes seek follow-up care

## 2024-08-04 ENCOUNTER — Other Ambulatory Visit: Payer: Self-pay | Admitting: *Deleted

## 2024-08-04 DIAGNOSIS — Z122 Encounter for screening for malignant neoplasm of respiratory organs: Secondary | ICD-10-CM

## 2024-08-04 DIAGNOSIS — Z87891 Personal history of nicotine dependence: Secondary | ICD-10-CM

## 2024-08-05 DIAGNOSIS — M5417 Radiculopathy, lumbosacral region: Secondary | ICD-10-CM | POA: Diagnosis not present

## 2024-08-05 DIAGNOSIS — M25512 Pain in left shoulder: Secondary | ICD-10-CM | POA: Diagnosis not present

## 2024-08-06 DIAGNOSIS — M47892 Other spondylosis, cervical region: Secondary | ICD-10-CM | POA: Diagnosis not present

## 2024-08-06 DIAGNOSIS — M47897 Other spondylosis, lumbosacral region: Secondary | ICD-10-CM | POA: Diagnosis not present

## 2024-08-24 ENCOUNTER — Encounter: Payer: Self-pay | Admitting: Pharmacist

## 2024-08-24 NOTE — Progress Notes (Signed)
 Pharmacy Quality Measure Review  This patient is appearing on a report for being at risk of failing the adherence measure for cholesterol (statin) medications this calendar year.   Medication: rosuvastatin   Last fill date: 04/26/2024 for 90 day supply per adherence report  Reviewed recent refill history in Dr Annemarie database. Actual last refill date was 07/27/2024 for 90 day supply. Patient has no refills remaining. Next appointment with PCP is 09/10/2024.    Insurance report was not up to date. No action needed at this time.   Madelin Ray, PharmD Clinical Pharmacist [facility] Primary Care  Population Health [phone]

## 2024-08-31 ENCOUNTER — Other Ambulatory Visit: Payer: Self-pay | Admitting: Cardiology

## 2024-09-01 MED ORDER — ISOSORBIDE MONONITRATE ER 30 MG PO TB24: 30.0000 mg | ORAL_TABLET | Freq: Every day | ORAL | 3 refills | Status: AC

## 2024-09-10 ENCOUNTER — Encounter: Payer: Self-pay | Admitting: Internal Medicine

## 2024-09-10 ENCOUNTER — Ambulatory Visit: Admitting: Internal Medicine

## 2024-09-10 VITALS — BP 108/66 | HR 92 | Temp 98.0°F | Resp 18 | Ht 76.0 in | Wt 162.5 lb

## 2024-09-10 DIAGNOSIS — I5032 Chronic diastolic (congestive) heart failure: Secondary | ICD-10-CM | POA: Diagnosis not present

## 2024-09-10 DIAGNOSIS — I10 Essential (primary) hypertension: Secondary | ICD-10-CM | POA: Diagnosis not present

## 2024-09-10 DIAGNOSIS — D45 Polycythemia vera: Secondary | ICD-10-CM | POA: Diagnosis not present

## 2024-09-10 DIAGNOSIS — E1159 Type 2 diabetes mellitus with other circulatory complications: Secondary | ICD-10-CM

## 2024-09-10 DIAGNOSIS — Z23 Encounter for immunization: Secondary | ICD-10-CM

## 2024-09-10 DIAGNOSIS — J449 Chronic obstructive pulmonary disease, unspecified: Secondary | ICD-10-CM | POA: Diagnosis not present

## 2024-09-10 DIAGNOSIS — E785 Hyperlipidemia, unspecified: Secondary | ICD-10-CM

## 2024-09-10 DIAGNOSIS — Z794 Long term (current) use of insulin: Secondary | ICD-10-CM | POA: Diagnosis not present

## 2024-09-10 NOTE — Progress Notes (Signed)
 "  Subjective:    Patient ID: [name], male    DOB: [birth year], 74 y.o.   MRN: [id number]  DOS:  09/10/2024 Follow-up  Discussed the use of AI scribe software for clinical note transcription with the patient, who gave verbal consent to proceed.  History of Present Illness [name] is a 74 year old male with coronary artery disease and diabetes who presents for a routine checkup.  Hypoxemia and oxygen  dependence - Arrived with low oxygen  saturation after forgetting home oxygen  - Uses continuous oxygen  therapy at home and a portable concentrator when outside - Uses inhaler as needed for chest congestion or cough - No recent fever, chest pain, significant cough, hemoptysis, or leg swelling  Blood pressure monitoring and symptoms - Checks blood pressure daily - Occasionally perceives blood pressure as low - Blood pressure today is 108 mmHg - No dizziness or other symptoms associated with low blood pressure  Glycemic control and hypoglycemia - Monitors blood glucose multiple times daily  Polycythemia vera - Followed by hematology for polycythemia vera    Review of Systems See above   Past Medical History:  Diagnosis Date   Abnormal myocardial perfusion [name] 2006   EF 44% ? inferoseptal ischemia. no cath done   Allergy    Benign neoplasm of cecum    Breast mass, right 03/2008   Cardiomyopathy (HCC) 07/20/2018   Ejection fraction 4045% in September 2019   Centrilobular emphysema (HCC) 08/25/2018   02/04/2018-high-res CT- no evidence of interstitial lung disease, severe centrilobular emphysema and mild diffuse bronchial wall thickening suggesting COPD, no calcified pleural plaques no pleural effusion, mild smooth bilateral pleural thickening which is nonspecific  02/04/2018-pulmonary function test- FVC 3.37 (71% predicted), postbronchodilator ratio 50, postbronchodilator FEV1 54, mid flow reve   CHF (congestive heart failure) (HCC)    COLONIC POLYPS, HX OF  03/18/2007   Qualifier: Diagnosis of  By: [name]    Contact dermatitis 01/31/2013   COPD (chronic obstructive pulmonary disease) (HCC)    dx'd 12/2017   COPD GOLD II A  08/25/2018   02/04/2018-high-res CT- no evidence of interstitial lung disease, severe centrilobular emphysema and mild diffuse bronchial wall thickening suggesting COPD, no calcified pleural plaques no pleural effusion, mild smooth bilateral pleural thickening which is nonspecific  02/04/2018-pulmonary function test- FVC 3.37 (71% predicted), postbronchodilator ratio 50, postbronchodilator FEV1 54, mid flow reve   Coronary artery disease    Critical aortic valve stenosis 01/12/2018   Calculated valve area less than 0.5 cm mean gradient more than 60 mm of mercury   DEGENERATIVE JOINT DISEASE, BACK 03/18/2007   Qualifier: Diagnosis of  By: [name]    Depressed left ventricular ejection fraction 03/28/2020   Diabetes mellitus (HCC) 07/25/2020   Dyslipidemia 04/20/2020   Dyspnea on exertion 01/12/2018   Emphysema of lung (HCC)    ERECTILE DYSFUNCTION, ORGANIC 02/10/2009   Qualifier: Diagnosis of  By: [name]    Former cigarette smoker 10/12/2018   Quit in 2012 41-pack-year smoking history   Goals of care, counseling/discussion 08/24/2020   H/O aortic valve replacement 05/05/2020   Heart murmur    HEMORRHOIDS, INTERNAL 03/18/2007   Qualifier: Diagnosis of  By: [name]    HTN (hypertension) 10/21/2007        Hyperlipemia 10/21/2007   Qualifier: Diagnosis of  By: [name]    Hypertension    Hypoglycemia due to insulin  03/26/2020   Low  back pain    MYOCARDIAL PERFUSION SCAN, WITH STRESS TEST, ABNORMAL 08/11/2008   Qualifier: Diagnosis of  By: Delford, MD, CODY Maude Dunnings    Nocturnal hypoxemia 08/25/2018   04/09/18 >>> ONO - telephone note>>> Patient's ONO on room air showed that he was less than 88% for more than an hour. Patient needs to be on 2 L at HS.   10/06/2018-overnight oximetry-entire duration was 4 hours and 23 minutes, SPO2 less than 88% for 25 minutes and 32 seconds     Oxygen  deficiency    uses 2L at night   Paroxysmal atrial fibrillation (HCC) 10/12/2018   PCP NOTES >>>>>>>>>>>>>>>>> 05/14/2020   Polycythemia vera (HCC) 08/04/2020   S/P AVR 03/04/2018   AVR 23 mm Edwards Magna Valve - bioprostetic 03/04/2018 LIMA - LAD   Status post coronary artery bypass graft 03/20/2018   LIMA to LAD during aortic valve replacement surgery 03/04/2018   Type 2 diabetes mellitus with hyperglycemia, with long-term current use of insulin  (HCC) 04/20/2020   Type 2 diabetes mellitus, uncontrolled, with neuropathy 05/20/2008   Qualifier: Diagnosis of  By: [name]     Past Surgical History:  Procedure Laterality Date   ABDOMINAL AORTOGRAM N/A 01/13/2018   Procedure: ABDOMINAL AORTOGRAM;  Surgeon: Burnard Debby LABOR, MD;  Location: [facility] INVASIVE CV LAB;  Service: Cardiovascular;  Laterality: N/A;   AORTIC VALVE REPLACEMENT N/A 03/04/2018   Procedure: AORTIC VALVE REPLACEMENT (AVR) 23mm Edwards Magna Ease Tissue Valve.;  Surgeon: Kerrin Elspeth BROCKS, MD;  Location: [facility] OR;  Service: Open Heart Surgery;  Laterality: N/A;   CARDIAC VALVE REPLACEMENT     COLONOSCOPY W/ POLYPECTOMY     COLONOSCOPY WITH PROPOFOL  N/A 02/22/2020   Procedure: COLONOSCOPY WITH PROPOFOL ;  Surgeon: Legrand Victory LITTIE DOUGLAS, MD;  Location: [facility] ENDOSCOPY;  Service: Gastroenterology;  Laterality: N/A;   CORONARY ARTERY BYPASS GRAFT N/A 03/04/2018   Procedure: CORONARY ARTERY BYPASS GRAFTING (CABG) x1:  LIMA to LAD.;  Surgeon: Kerrin Elspeth BROCKS, MD;  Location: [facility] OR;  Service: Open Heart Surgery;  Laterality: N/A;   CORONARY PRESSURE/FFR [name] N/A 05/10/2020   Procedure: INTRAVASCULAR PRESSURE WIRE/FFR [name];  Surgeon: Wonda Sharper, MD;  Location: [facility] INVASIVE CV LAB;  Service: Cardiovascular;  Laterality: N/A;   HEMOSTASIS CLIP PLACEMENT  02/22/2020   Procedure: HEMOSTASIS CLIP  PLACEMENT;  Surgeon: Legrand Victory LITTIE DOUGLAS, MD;  Location: [facility]OSCOPY;  Service: Gastroenterology;;   LEFT HEART CATH AND CORS/GRAFTS ANGIOGRAPHY N/A 05/10/2020   Procedure: LEFT HEART CATH AND CORS/GRAFTS ANGIOGRAPHY;  Surgeon: Wonda Sharper, MD;  Location: [facility] INVASIVE CV LAB;  Service: Cardiovascular;  Laterality: N/A;   LIPOMA EXCISION Left 07/2008    back of shoulder Dr Nalani   POLYPECTOMY  02/22/2020   Procedure: POLYPECTOMY;  Surgeon: Legrand Victory LITTIE DOUGLAS, MD;  Location: [facility]OSCOPY;  Service: Gastroenterology;;   RIGHT/LEFT HEART CATH AND CORONARY ANGIOGRAPHY N/A 01/13/2018   Procedure: RIGHT/LEFT HEART CATH AND CORONARY ANGIOGRAPHY;  Surgeon: Burnard Debby LABOR, MD;  Location: [facility] INVASIVE CV LAB;  Service: Cardiovascular;  Laterality: N/A;   TEE WITHOUT CARDIOVERSION N/A 03/04/2018   Procedure: TRANSESOPHAGEAL ECHOCARDIOGRAM (TEE);  Surgeon: Kerrin Elspeth BROCKS, MD;  Location: [facility] OR;  Service: Open Heart Surgery;  Laterality: N/A;   TONSILLECTOMY     WISDOM TOOTH EXTRACTION      Current Outpatient Medications  Medication Instructions   acetaminophen  (TYLENOL ) 1,000 mg, Every 6 hours PRN   albuterol  (VENTOLIN  HFA) 108 (90 Base) MCG/ACT inhaler 2 puffs, Inhalation, Every  6 hours PRN   amoxicillin  (AMOXIL ) 500 MG capsule Take 4 tablets by mouth 1 hour before dental procedure or cleaning.   apixaban  (ELIQUIS ) 5 mg, Oral, 2 times daily   budesonide -formoterol  (SYMBICORT ) 160-4.5 MCG/ACT inhaler 2 puffs, Inhalation, 2 times daily   cetirizine  (ZYRTEC ) 10 mg, Daily PRN   Cyanocobalamin  (VITAMIN B-12 PO) 1 tablet, Daily   glucose blood (PRODIGY NO CODING BLOOD GLUC) test strip 1 each, Other, 2 times daily, Use as instructed to check blood sugar twice a day.  DX  E11.40   hydrALAZINE  (APRESOLINE ) 50 mg, Oral, 3 times daily   insulin  aspart (NOVOLOG  FLEXPEN) 100 UNIT/ML FlexPen Max daily 30 units   Insulin  Pen Needle (BD PEN NEEDLE NANO 2ND GEN) 32G X 4 MM MISC 1 Device, Subcutaneous, 4 times  daily   isosorbide  mononitrate (IMDUR ) 30 mg, Oral, Daily   Lantus  SoloStar 18 Units, Subcutaneous, Daily   metFORMIN  (GLUCOPHAGE ) 1,000 mg, Oral, 2 times daily with meals   metoprolol  succinate (TOPROL -XL) 50 mg, Oral, Daily   OXYGEN  2 L/min, Continuous   rosuvastatin  (CRESTOR ) 20 mg, Oral, Daily   sacubitril -valsartan  (ENTRESTO ) 97-103 MG 1 tablet, Oral, 2 times daily       Objective:   Physical Exam BP 108/66   Pulse 92   Temp 98 F (36.7 C) (Oral)   Resp 18   Ht 6' 4 (1.93 m)   Wt 162 lb 8 oz (73.7 kg)   SpO2 99% Comment: 2L/min  BMI 19.78 kg/m  General:   Well developed, NAD, BMI noted. HEENT:  Normocephalic . Face symmetric, atraumatic Lungs:  CTA B Normal respiratory effort, no intercostal retractions, no accessory muscle use. Heart: RRR,  no murmur.  Lower extremities: no pretibial edema bilaterally  Skin: Not pale. Not jaundice Neurologic:  alert & oriented X3.  Speech normal, gait appropriate for age and unassisted Psych--  Cognition and judgment appear intact.  Cooperative with normal attention span and concentration.  Behavior appropriate. No anxious or depressed appearing.      Assessment    Problem list (new patient 03/2020) DM HTN CAD, CABG and aortic valve replacement due to stenosis on 02/2018 Cardiomyopathy, cath 05/10/2020, Rx medical treatment COPD 02/04/2018-high-res CT- no evidence of interstitial lung disease, severe centrilobular emphysema  02/04/2018-pulmonary function test- FVC 3.37 (71% predicted),  mid flow reversibility after bronchodilator  Polycythemia vera, + JAK2 Dx [id number].  Assessment & Plan COPD Gold 2A: LOV pulmonary 07/07/2024, on Symbicort , albuterol . Went to urgent care with a COPD exacerbation 07/31/2024, chest x-ray showed chronic changes and mild increase in interstitial markings, early CHF?SABRA  Was prescribed prednisone  and amoxicillin . Currently doing well. O2 sat dropped upon arrival, he was not using portable oxygen .   On 2 L of oxygen  it went up to 99%. Strongly encouraged to use oxygen  at home and portable oxygen  when going out. CAD, cardiomyopathy, aortic stenosis status post replacement: LOV cardiology 03/2024.  Next visit should be around 09/2024 Denies chest pain, no edema noted HTN Good med compliance.  Occasional low readings like today (systolic of 108)  but w/ no symptoms. Continue with hydralazine , Imdur , metoprolol , Entresto .  Check BMP DM (with CAD) - Follow up with endocrinology.  Denies low blood sugars to me High cholesterol: On rosuvastatin  for cholesterol management.  Last LFTs okay, check FLP Polycythemia vera Pending hematology appointment next month.  Check a CBC Preventive care: CCS: Saw GI October 2025, at that time they recommended to hold a colonoscopy  - had a flu  shot, recommend COVID-vaccine if not done recently and RSV. RTC 6 months       "

## 2024-09-10 NOTE — Assessment & Plan Note (Signed)
 COPD Gold 2A: LOV pulmonary 07/07/2024, on Symbicort , albuterol . Went to urgent care with a COPD exacerbation 07/31/2024, chest x-ray showed chronic changes and mild increase in interstitial markings, early CHF?SABRA  Was prescribed prednisone  and amoxicillin . Currently doing well. O2 sat dropped upon arrival, he was not using portable oxygen .  On 2 L of oxygen  it went up to 99%. Strongly encouraged to use oxygen  at home and portable oxygen  when going out. CAD, cardiomyopathy, aortic stenosis status post replacement: LOV cardiology 03/2024.  Next visit should be around 09/2024 Denies chest pain, no edema noted HTN Good med compliance.  Occasional low readings like today (systolic of 108)  but w/ no symptoms. Continue with hydralazine , Imdur , metoprolol , Entresto .  Check BMP DM (with CAD) - Follow up with endocrinology.  Denies low blood sugars to me High cholesterol: On rosuvastatin  for cholesterol management.  Last LFTs okay, check FLP Polycythemia vera Pending hematology appointment next month.  Check a CBC Preventive care: CCS: Saw GI October 2025, at that time they recommended to hold a colonoscopy  - had a flu shot, recommend COVID-vaccine if not done recently and RSV. RTC 6 months

## 2024-09-10 NOTE — Patient Instructions (Addendum)
 Please read your instructions carefully.   GO TO THE LAB :  Get the blood work    Go to the front desk for the checkout Please make an appointment for a checkup in 6 months  Vaccines are recommending: - COVID booster if not done in the last 4 months - RSV  Continue checking your blood pressure regularly Blood pressure goal:  between 110/65 and  130/80. If it is consistently higher or lower, let me know     BLOOD PRESSURE MONITORING AND SYMPTOMS:  -You check your blood pressure daily and occasionally perceive it as low. Your blood pressure today is 108 mmHg without any symptoms. -Continue your current antihypertensive medications.    CHRONIC OBSTRUCTIVE PULMONARY DISEASE:  -Your oxygen  saturation was low upon arrival, likely due to forgetting your oxygen . It is currently at 99% with oxygen . -Ensure you use your oxygen  consistently, especially when leaving the house. -Use your portable oxygen  when going out.    HYPERTENSION: Your blood pressure is well-controlled with your current medications   TYPE 2 DIABETES MELLITUS:   -Follow up with endocrinology.

## 2024-09-11 LAB — CBC WITH DIFFERENTIAL/PLATELET
Absolute Lymphocytes: 1016 {cells}/uL (ref 850–3900)
Absolute Monocytes: 823 {cells}/uL (ref 200–950)
Basophils Absolute: 67 {cells}/uL (ref 0–200)
Basophils Relative: 0.8 %
Eosinophils Absolute: 59 {cells}/uL (ref 15–500)
Eosinophils Relative: 0.7 %
HCT: 38.9 % — ABNORMAL LOW (ref 39.4–51.1)
Hemoglobin: 9.7 g/dL — ABNORMAL LOW (ref 13.2–17.1)
MCH: 15.1 pg — ABNORMAL LOW (ref 27.0–33.0)
MCHC: 24.9 g/dL — ABNORMAL LOW (ref 31.6–35.4)
MCV: 60.6 fL — ABNORMAL LOW (ref 81.4–101.7)
Monocytes Relative: 9.8 %
Neutro Abs: 6434 {cells}/uL (ref 1500–7800)
Neutrophils Relative %: 76.6 %
Platelets: 227 Thousand/uL (ref 140–400)
RBC: 6.42 Million/uL — ABNORMAL HIGH (ref 4.20–5.80)
RDW: 23.4 % — ABNORMAL HIGH (ref 11.0–15.0)
Total Lymphocyte: 12.1 %
WBC: 8.4 Thousand/uL (ref 3.8–10.8)

## 2024-09-11 LAB — BASIC METABOLIC PANEL WITH GFR
BUN: 13 mg/dL (ref 7–25)
CO2: 27 mmol/L (ref 20–32)
Calcium: 9.3 mg/dL (ref 8.6–10.3)
Chloride: 104 mmol/L (ref 98–110)
Creat: 0.98 mg/dL (ref 0.70–1.28)
Glucose, Bld: 135 mg/dL — ABNORMAL HIGH (ref 65–99)
Potassium: 4.8 mmol/L (ref 3.5–5.3)
Sodium: 139 mmol/L (ref 135–146)
eGFR: 81 mL/min/1.73m2

## 2024-09-11 LAB — LIPID PANEL
Cholesterol: 108 mg/dL
HDL: 46 mg/dL
LDL Cholesterol (Calc): 43 mg/dL
Non-HDL Cholesterol (Calc): 62 mg/dL
Total CHOL/HDL Ratio: 2.3 (calc)
Triglycerides: 103 mg/dL

## 2024-09-11 LAB — CBC MORPHOLOGY

## 2024-09-13 ENCOUNTER — Ambulatory Visit: Payer: Self-pay | Admitting: Internal Medicine

## 2024-09-23 ENCOUNTER — Encounter: Payer: Self-pay | Admitting: Gastroenterology

## 2024-10-01 ENCOUNTER — Ambulatory Visit: Admitting: Hematology & Oncology

## 2024-10-01 ENCOUNTER — Other Ambulatory Visit: Payer: Self-pay | Admitting: Cardiology

## 2024-10-01 ENCOUNTER — Other Ambulatory Visit: Payer: Self-pay

## 2024-10-01 ENCOUNTER — Encounter: Payer: Self-pay | Admitting: Hematology & Oncology

## 2024-10-01 ENCOUNTER — Inpatient Hospital Stay

## 2024-10-01 ENCOUNTER — Encounter

## 2024-10-01 VITALS — BP 104/74 | Ht 76.0 in | Wt 162.0 lb

## 2024-10-01 DIAGNOSIS — D45 Polycythemia vera: Secondary | ICD-10-CM

## 2024-10-01 DIAGNOSIS — J449 Chronic obstructive pulmonary disease, unspecified: Secondary | ICD-10-CM

## 2024-10-01 DIAGNOSIS — I420 Dilated cardiomyopathy: Secondary | ICD-10-CM

## 2024-10-01 LAB — CMP (CANCER CENTER ONLY)
ALT: 8 U/L (ref 0–44)
AST: 16 U/L (ref 15–41)
Albumin: 4.4 g/dL (ref 3.5–5.0)
Alkaline Phosphatase: 48 U/L (ref 38–126)
Anion gap: 15 (ref 5–15)
BUN: 12 mg/dL (ref 8–23)
CO2: 22 mmol/L (ref 22–32)
Calcium: 9.6 mg/dL (ref 8.9–10.3)
Chloride: 103 mmol/L (ref 98–111)
Creatinine: 1.06 mg/dL (ref 0.61–1.24)
GFR, Estimated: >60 mL/min
Glucose, Bld: 264 mg/dL — ABNORMAL HIGH (ref 70–99)
Potassium: 4.6 mmol/L (ref 3.5–5.1)
Sodium: 139 mmol/L (ref 135–145)
Total Bilirubin: 0.5 mg/dL (ref 0.0–1.2)
Total Protein: 7.2 g/dL (ref 6.5–8.1)

## 2024-10-01 LAB — CBC WITH DIFFERENTIAL (CANCER CENTER ONLY)
Abs Immature Granulocytes: 0.09 10*3/uL — ABNORMAL HIGH (ref 0.00–0.07)
Basophils Absolute: 0.1 10*3/uL (ref 0.0–0.1)
Basophils Relative: 1 %
Eosinophils Absolute: 0 10*3/uL (ref 0.0–0.5)
Eosinophils Relative: 1 %
HCT: 34.5 % — ABNORMAL LOW (ref 39.0–52.0)
Hemoglobin: 9.5 g/dL — ABNORMAL LOW (ref 13.0–17.0)
Immature Granulocytes: 1 %
Lymphocytes Relative: 12 %
Lymphs Abs: 1 10*3/uL (ref 0.7–4.0)
MCH: 15.5 pg — ABNORMAL LOW (ref 26.0–34.0)
MCHC: 27.5 g/dL — ABNORMAL LOW (ref 30.0–36.0)
MCV: 56.2 fL — ABNORMAL LOW (ref 80.0–100.0)
Monocytes Absolute: 0.9 10*3/uL (ref 0.1–1.0)
Monocytes Relative: 11 %
Neutro Abs: 5.9 10*3/uL (ref 1.7–7.7)
Neutrophils Relative %: 74 %
Platelet Count: 193 10*3/uL (ref 150–400)
RBC: 6.14 MIL/uL — ABNORMAL HIGH (ref 4.22–5.81)
RDW: 26.8 % — ABNORMAL HIGH (ref 11.5–15.5)
WBC Count: 7.9 10*3/uL (ref 4.0–10.5)
nRBC: 0 % (ref 0.0–0.2)

## 2024-10-01 LAB — IRON AND IRON BINDING CAPACITY (CC-WL,HP ONLY)
Iron: 16 ug/dL — ABNORMAL LOW (ref 45–182)
Saturation Ratios: 4 % — ABNORMAL LOW (ref 17.9–39.5)
TIBC: 435 ug/dL (ref 250–450)
UIBC: 419 ug/dL

## 2024-10-01 LAB — FERRITIN: Ferritin: 9 ng/mL — ABNORMAL LOW (ref 24–336)

## 2024-10-01 LAB — LACTATE DEHYDROGENASE: LDH: 332 U/L — ABNORMAL HIGH (ref 105–235)

## 2024-10-01 LAB — SAVE SMEAR(SSMR), FOR PROVIDER SLIDE REVIEW

## 2024-10-01 NOTE — Progress Notes (Signed)
 " Hematology and Oncology Follow Up Visit  [name] [id number] May 24, 1951 74 y.o. 10/01/2024   Principle Diagnosis:  Polycythemia vera- JAK2 (+)   Current Therapy:        Phlebotomy to maintain hematocrit less than 45% Aspirin  325 mg PO daily - on full dose for cardiac issues   Interim History:  [name] is here today for follow-up.  We see him every 6 months.  Since was BEEN having some issues.  He is losing weight.  He is tired.  I am sure that a lot of this has to do with his iron being very low.  In addition, I do believe that his blood sugars are being high are probably contributing to this.  He does urinate quite a bit.  He does not have any diarrhea.  He does wear oxygen .  He does have the underlying COPD.  I am not sure exactly what might be going on with him.  It certainly would not be a bad idea to get a CT scan to make sure there is nothing going on that might suggest a malignancy.  He has had no fever.  He has had no rashes.  There is been no swollen lymph nodes.  Overall, I would say that his performance status is probably ECOG 2.    Medications:  Allergies as of 10/01/2024       Reactions   Farxiga  [dapagliflozin ] Rash, Other (See Comments)   swollen penis   Lisinopril  Other (See Comments)   Hyperkalemia   Amlodipine Besylate Other (See Comments)   REACTION: ? caused left axillary nodules/chest flutter   Hydrochlorothiazide Hives        Medication List        Accurate as of October 01, 2024 11:17 AM. If you have any questions, ask your nurse or doctor.          acetaminophen  500 MG tablet Commonly known as: TYLENOL  Take 1,000 mg by mouth every 6 (six) hours as needed for moderate pain or headache.   albuterol  108 (90 Base) MCG/ACT inhaler Commonly known as: VENTOLIN  HFA Inhale 2 puffs into the lungs every 6 (six) hours as needed for wheezing or shortness of breath.   amoxicillin  500 MG capsule Commonly known as: AMOXIL  Take 4  tablets by mouth 1 hour before dental procedure or cleaning.   apixaban  5 MG Tabs tablet Commonly known as: Eliquis  Take 1 tablet (5 mg total) by mouth 2 (two) times daily.   BD Pen Needle Nano 2nd Gen 32G X 4 MM Misc Generic drug: Insulin  Pen Needle Inject 1 Device into the skin in the morning, at noon, in the evening, and at bedtime.   budesonide -formoterol  160-4.5 MCG/ACT inhaler Commonly known as: Symbicort  Inhale 2 puffs into the lungs in the morning and at bedtime.   cetirizine  10 MG tablet Commonly known as: ZYRTEC  Take 10 mg by mouth daily as needed for allergies.   hydrALAZINE  50 MG tablet Commonly known as: APRESOLINE  TAKE 1 TABLET BY MOUTH THREE TIMES A DAY   isosorbide  mononitrate 30 MG 24 hr tablet Commonly known as: IMDUR  Take 1 tablet (30 mg total) by mouth daily.   Lantus  SoloStar 100 UNIT/ML Solostar Pen Generic drug: insulin  glargine Inject 18 Units into the skin daily.   metFORMIN  1000 MG tablet Commonly known as: GLUCOPHAGE  Take 1 tablet (1,000 mg total) by mouth 2 (two) times daily with a meal.   metoprolol  succinate 50 MG 24 hr tablet Commonly known as:  TOPROL -XL TAKE 1 TABLET BY MOUTH EVERY DAY   NovoLOG  FlexPen 100 UNIT/ML FlexPen Generic drug: insulin  aspart Max daily 30 units   OXYGEN  Inhale 2 L/min into the lungs continuous. Use at night   Prodigy No Coding Blood Gluc test strip Generic drug: glucose blood 1 each by Other route 2 (two) times daily. Use as instructed to check blood sugar twice a day.  DX  E11.40   rosuvastatin  20 MG tablet Commonly known as: CRESTOR  Take 1 tablet (20 mg total) by mouth daily.   sacubitril -valsartan  97-103 MG Commonly known as: Entresto  Take 1 tablet by mouth 2 (two) times daily.   spironolactone  25 MG tablet Commonly known as: ALDACTONE  Take 25 mg by mouth 2 (two) times daily.   VITAMIN B-12 PO Take 1 tablet by mouth daily.        Allergies:  Allergies  Allergen Reactions   Farxiga   [Dapagliflozin ] Rash and Other (See Comments)    swollen penis   Lisinopril  Other (See Comments)    Hyperkalemia    Amlodipine Besylate Other (See Comments)    REACTION: ? caused left axillary nodules/chest flutter   Hydrochlorothiazide Hives    Past Medical History, Surgical history, Social history, and Family History were reviewed and updated.  Review of Systems: Review of Systems  Constitutional: Negative.   HENT: Negative.    Eyes: Negative.   Respiratory: Negative.    Cardiovascular: Negative.   Gastrointestinal: Negative.   Genitourinary: Negative.   Musculoskeletal: Negative.   Skin: Negative.   Neurological: Negative.   Endo/Heme/Allergies: Negative.   Psychiatric/Behavioral: Negative.       Physical Exam:  height is 6' 4 (1.93 m) and weight is 162 lb (73.5 kg). His blood pressure is 104/74. His oxygen  saturation is 100%.   Wt Readings from Last 3 Encounters:  10/01/24 162 lb (73.5 kg)  09/10/24 162 lb 8 oz (73.7 kg)  07/07/24 167 lb (75.8 kg)   Physical Exam Vitals reviewed.  HENT:     Head: Normocephalic and atraumatic.  Eyes:     Pupils: Pupils are equal, round, and reactive to light.  Cardiovascular:     Rate and Rhythm: Normal rate and regular rhythm.     Heart sounds: Normal heart sounds.  Pulmonary:     Effort: Pulmonary effort is normal.     Breath sounds: Normal breath sounds.  Abdominal:     General: Bowel sounds are normal.     Palpations: Abdomen is soft.  Musculoskeletal:        General: No tenderness or deformity. Normal range of motion.     Cervical back: Normal range of motion.  Lymphadenopathy:     Cervical: No cervical adenopathy.  Skin:    General: Skin is warm and dry.     Findings: No erythema or rash.  Neurological:     Mental Status: He is alert and oriented to person, place, and time.  Psychiatric:        Behavior: Behavior normal.        Thought Content: Thought content normal.        Judgment: Judgment normal.       Lab Results  Component Value Date   WBC 7.9 10/01/2024   HGB 9.5 (L) 10/01/2024   HCT 34.5 (L) 10/01/2024   MCV 56.2 (L) 10/01/2024   PLT 193 10/01/2024   Lab Results  Component Value Date   FERRITIN 5.3 (L) 04/13/2024   IRON 18 (L) 04/13/2024   TIBC 432.6 04/13/2024  UIBC 408 03/31/2024   IRONPCTSAT 4.2 (L) 04/13/2024   Lab Results  Component Value Date   RETICCTPCT 1.1 08/30/2021   RBC 6.14 (H) 10/01/2024   No results found for: KPAFRELGTCHN, LAMBDASER, KAPLAMBRATIO No results found for: IGGSERUM, IGA, IGMSERUM No results found for: STEPHANY CARLOTA BENSON MARKEL EARLA JOANNIE DOC VICK, SPEI   Chemistry      Component Value Date/Time   NA 139 10/01/2024 0953   NA 140 01/29/2024 1351   K 4.6 10/01/2024 0953   CL 103 10/01/2024 0953   CO2 22 10/01/2024 0953   BUN 12 10/01/2024 0953   BUN 13 01/29/2024 1351   CREATININE 1.06 10/01/2024 0953   CREATININE 0.98 09/10/2024 1425      Component Value Date/Time   CALCIUM  9.6 10/01/2024 0953   ALKPHOS 48 10/01/2024 0953   AST 16 10/01/2024 0953   ALT 8 10/01/2024 0953   BILITOT 0.5 10/01/2024 0953       Impression and Plan: Mr. Hart is a very pleasant 74 yo African American gentleman with polycythemia vera, JAK 2 positive.   I failed to mention that it sounds like is going need valve surgery.  He may need aortic valve surgery.  He is quite anemic.  Again I suspect that he is markedly iron deficient.  He clearly does not need to be phlebotomized.  Actually, we probably need to give him some iron.  But we need to really get his blood count up.  We will also see what his CT scan shows.  I will try to get the iron into him next week.  I will try to get the CT scan done next week.  We will plan to get him back in 1 month.  Hopefully, he will be feeling a little bit better.  At that point in time, we should be able to have a better idea as to what might happen with  his heart.   Maude JONELLE Crease, MD 2/6/202611:17 AM  "

## 2024-10-04 ENCOUNTER — Ambulatory Visit (HOSPITAL_BASED_OUTPATIENT_CLINIC_OR_DEPARTMENT_OTHER)

## 2024-10-06 ENCOUNTER — Ambulatory Visit (HOSPITAL_BASED_OUTPATIENT_CLINIC_OR_DEPARTMENT_OTHER)

## 2024-10-14 ENCOUNTER — Ambulatory Visit (HOSPITAL_COMMUNITY)

## 2024-10-18 ENCOUNTER — Ambulatory Visit: Admitting: Cardiovascular Disease

## 2024-10-28 ENCOUNTER — Encounter

## 2024-11-01 ENCOUNTER — Ambulatory Visit (HOSPITAL_COMMUNITY)

## 2024-11-01 ENCOUNTER — Ambulatory Visit: Admitting: Cardiovascular Disease

## 2024-11-11 ENCOUNTER — Encounter: Admitting: Gastroenterology

## 2024-12-10 ENCOUNTER — Ambulatory Visit: Admitting: Internal Medicine

## 2025-04-15 ENCOUNTER — Ambulatory Visit
